# Patient Record
Sex: Female | Born: 1945 | Race: White | Hispanic: No | Smoking: Former smoker
Health system: Southern US, Community
[De-identification: ages and names within clinical notes are randomized; demographics above are authoritative.]

## PROBLEM LIST (undated history)

## (undated) DIAGNOSIS — E785 Hyperlipidemia, unspecified: Secondary | ICD-10-CM

## (undated) DIAGNOSIS — K922 Gastrointestinal hemorrhage, unspecified: Secondary | ICD-10-CM

## (undated) DIAGNOSIS — Z9889 Other specified postprocedural states: Secondary | ICD-10-CM

## (undated) DIAGNOSIS — R51 Headache: Secondary | ICD-10-CM

## (undated) DIAGNOSIS — J9610 Chronic respiratory failure, unspecified whether with hypoxia or hypercapnia: Secondary | ICD-10-CM

## (undated) DIAGNOSIS — F329 Major depressive disorder, single episode, unspecified: Secondary | ICD-10-CM

## (undated) DIAGNOSIS — I350 Nonrheumatic aortic (valve) stenosis: Secondary | ICD-10-CM

## (undated) DIAGNOSIS — M8580 Other specified disorders of bone density and structure, unspecified site: Secondary | ICD-10-CM

## (undated) DIAGNOSIS — D638 Anemia in other chronic diseases classified elsewhere: Secondary | ICD-10-CM

## (undated) DIAGNOSIS — I5032 Chronic diastolic (congestive) heart failure: Secondary | ICD-10-CM

## (undated) DIAGNOSIS — Z86010 Personal history of colonic polyps: Secondary | ICD-10-CM

## (undated) DIAGNOSIS — I10 Essential (primary) hypertension: Secondary | ICD-10-CM

## (undated) DIAGNOSIS — M1990 Unspecified osteoarthritis, unspecified site: Secondary | ICD-10-CM

## (undated) DIAGNOSIS — R519 Headache, unspecified: Secondary | ICD-10-CM

## (undated) DIAGNOSIS — J449 Chronic obstructive pulmonary disease, unspecified: Secondary | ICD-10-CM

## (undated) DIAGNOSIS — N2581 Secondary hyperparathyroidism of renal origin: Secondary | ICD-10-CM

## (undated) DIAGNOSIS — N289 Disorder of kidney and ureter, unspecified: Secondary | ICD-10-CM

## (undated) DIAGNOSIS — J479 Bronchiectasis, uncomplicated: Secondary | ICD-10-CM

## (undated) DIAGNOSIS — Z86718 Personal history of other venous thrombosis and embolism: Secondary | ICD-10-CM

## (undated) DIAGNOSIS — N186 End stage renal disease: Secondary | ICD-10-CM

## (undated) DIAGNOSIS — F32A Depression, unspecified: Secondary | ICD-10-CM

## (undated) DIAGNOSIS — I2699 Other pulmonary embolism without acute cor pulmonale: Secondary | ICD-10-CM

## (undated) DIAGNOSIS — K219 Gastro-esophageal reflux disease without esophagitis: Secondary | ICD-10-CM

## (undated) HISTORY — DX: Anemia in other chronic diseases classified elsewhere: D63.8

## (undated) HISTORY — DX: Gastrointestinal hemorrhage, unspecified: K92.2

## (undated) HISTORY — DX: Major depressive disorder, single episode, unspecified: F32.9

## (undated) HISTORY — DX: Secondary hyperparathyroidism of renal origin: N25.81

## (undated) HISTORY — DX: Headache, unspecified: R51.9

## (undated) HISTORY — DX: Chronic obstructive pulmonary disease, unspecified: J44.9

## (undated) HISTORY — DX: Unspecified osteoarthritis, unspecified site: M19.90

## (undated) HISTORY — DX: Bronchiectasis, uncomplicated: J47.9

## (undated) HISTORY — DX: Other specified disorders of bone density and structure, unspecified site: M85.80

## (undated) HISTORY — DX: Essential (primary) hypertension: I10

## (undated) HISTORY — DX: End stage renal disease: N18.6

## (undated) HISTORY — DX: Chronic respiratory failure, unspecified whether with hypoxia or hypercapnia: J96.10

## (undated) HISTORY — DX: Chronic diastolic (congestive) heart failure: I50.32

## (undated) HISTORY — DX: Headache: R51

## (undated) HISTORY — DX: Morbid (severe) obesity due to excess calories: E66.01

## (undated) HISTORY — DX: Personal history of other venous thrombosis and embolism: Z86.718

## (undated) HISTORY — DX: Personal history of colonic polyps: Z86.010

## (undated) HISTORY — DX: Gastro-esophageal reflux disease without esophagitis: K21.9

## (undated) HISTORY — DX: Other pulmonary embolism without acute cor pulmonale: I26.99

## (undated) HISTORY — DX: Nonrheumatic aortic (valve) stenosis: I35.0

## (undated) HISTORY — DX: Depression, unspecified: F32.A

## (undated) HISTORY — DX: Hyperlipidemia, unspecified: E78.5

---
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
## 1978-04-20 HISTORY — PX: TUBAL LIGATION: SHX77

## 2001-04-20 HISTORY — PX: BUNIONECTOMY: SHX129

## 2004-02-07 ENCOUNTER — Ambulatory Visit: Payer: Self-pay | Admitting: General Surgery

## 2004-04-20 HISTORY — PX: REPLACEMENT TOTAL KNEE: SUR1224

## 2004-09-29 ENCOUNTER — Ambulatory Visit: Payer: Self-pay | Admitting: General Practice

## 2004-10-01 ENCOUNTER — Inpatient Hospital Stay: Payer: Self-pay | Admitting: Specialist

## 2005-09-29 ENCOUNTER — Ambulatory Visit: Payer: Self-pay | Admitting: General Practice

## 2006-10-05 ENCOUNTER — Ambulatory Visit: Payer: Self-pay | Admitting: Endocrinology

## 2007-04-21 DIAGNOSIS — I2699 Other pulmonary embolism without acute cor pulmonale: Secondary | ICD-10-CM

## 2007-04-21 HISTORY — PX: SHOULDER ARTHROSCOPY: SHX128

## 2007-04-21 HISTORY — DX: Other pulmonary embolism without acute cor pulmonale: I26.99

## 2007-05-20 ENCOUNTER — Ambulatory Visit: Payer: Self-pay | Admitting: Family Medicine

## 2007-06-22 ENCOUNTER — Ambulatory Visit: Payer: Self-pay | Admitting: Nephrology

## 2007-10-17 ENCOUNTER — Ambulatory Visit: Payer: Self-pay | Admitting: Family Medicine

## 2007-11-01 ENCOUNTER — Ambulatory Visit: Payer: Self-pay | Admitting: Family Medicine

## 2007-11-02 ENCOUNTER — Ambulatory Visit: Payer: Self-pay | Admitting: Internal Medicine

## 2008-01-27 ENCOUNTER — Ambulatory Visit: Payer: Self-pay | Admitting: Family Medicine

## 2008-01-27 ENCOUNTER — Other Ambulatory Visit: Payer: Self-pay

## 2009-03-08 ENCOUNTER — Inpatient Hospital Stay: Payer: Self-pay | Admitting: Internal Medicine

## 2009-03-08 ENCOUNTER — Ambulatory Visit: Payer: Self-pay | Admitting: Family Medicine

## 2009-03-18 ENCOUNTER — Other Ambulatory Visit: Payer: Self-pay | Admitting: Family Medicine

## 2009-03-20 ENCOUNTER — Other Ambulatory Visit: Payer: Self-pay | Admitting: Family Medicine

## 2009-03-22 ENCOUNTER — Other Ambulatory Visit: Payer: Self-pay | Admitting: Family Medicine

## 2009-03-25 ENCOUNTER — Encounter: Payer: Self-pay | Admitting: Vascular Surgery

## 2009-04-20 ENCOUNTER — Encounter: Payer: Self-pay | Admitting: Vascular Surgery

## 2009-05-21 ENCOUNTER — Encounter: Payer: Self-pay | Admitting: Vascular Surgery

## 2009-06-18 ENCOUNTER — Encounter: Payer: Self-pay | Admitting: Vascular Surgery

## 2009-07-19 ENCOUNTER — Encounter: Payer: Self-pay | Admitting: Vascular Surgery

## 2009-08-18 ENCOUNTER — Encounter: Payer: Self-pay | Admitting: Vascular Surgery

## 2009-09-18 ENCOUNTER — Encounter: Payer: Self-pay | Admitting: Vascular Surgery

## 2009-10-18 ENCOUNTER — Encounter: Payer: Self-pay | Admitting: Vascular Surgery

## 2009-11-18 ENCOUNTER — Encounter: Payer: Self-pay | Admitting: Vascular Surgery

## 2009-12-19 ENCOUNTER — Encounter: Payer: Self-pay | Admitting: Vascular Surgery

## 2010-01-18 ENCOUNTER — Encounter: Payer: Self-pay | Admitting: Vascular Surgery

## 2010-02-18 ENCOUNTER — Encounter: Payer: Self-pay | Admitting: Vascular Surgery

## 2010-03-20 ENCOUNTER — Encounter: Payer: Self-pay | Admitting: Vascular Surgery

## 2010-04-20 HISTORY — PX: CHOLECYSTECTOMY: SHX55

## 2010-06-04 ENCOUNTER — Other Ambulatory Visit: Payer: Self-pay | Admitting: Internal Medicine

## 2010-06-19 ENCOUNTER — Other Ambulatory Visit: Payer: Self-pay | Admitting: Internal Medicine

## 2010-07-20 ENCOUNTER — Other Ambulatory Visit: Payer: Self-pay | Admitting: Internal Medicine

## 2010-10-02 ENCOUNTER — Other Ambulatory Visit: Payer: Self-pay | Admitting: Family Medicine

## 2010-10-19 ENCOUNTER — Other Ambulatory Visit: Payer: Self-pay | Admitting: Family Medicine

## 2010-12-05 ENCOUNTER — Other Ambulatory Visit: Payer: Self-pay | Admitting: Vascular Surgery

## 2010-12-20 ENCOUNTER — Other Ambulatory Visit: Payer: Self-pay | Admitting: Vascular Surgery

## 2011-01-19 ENCOUNTER — Other Ambulatory Visit: Payer: Self-pay | Admitting: Vascular Surgery

## 2011-02-19 ENCOUNTER — Other Ambulatory Visit: Payer: Self-pay | Admitting: Vascular Surgery

## 2011-03-21 ENCOUNTER — Other Ambulatory Visit: Payer: Self-pay | Admitting: Vascular Surgery

## 2011-04-21 ENCOUNTER — Other Ambulatory Visit: Payer: Self-pay | Admitting: Vascular Surgery

## 2011-04-21 DIAGNOSIS — Z860100 Personal history of colon polyps, unspecified: Secondary | ICD-10-CM

## 2011-04-21 HISTORY — DX: Personal history of colon polyps, unspecified: Z86.0100

## 2011-04-21 HISTORY — DX: Personal history of colonic polyps: Z86.010

## 2011-05-06 LAB — PROTIME-INR: Prothrombin Time: 25.5 secs — ABNORMAL HIGH (ref 11.5–14.7)

## 2011-05-22 ENCOUNTER — Other Ambulatory Visit: Payer: Self-pay | Admitting: Family Medicine

## 2011-05-22 ENCOUNTER — Other Ambulatory Visit: Payer: Self-pay | Admitting: Vascular Surgery

## 2011-05-22 LAB — COMPREHENSIVE METABOLIC PANEL
Anion Gap: 15 (ref 7–16)
Calcium, Total: 8.6 mg/dL (ref 8.5–10.1)
Chloride: 112 mmol/L — ABNORMAL HIGH (ref 98–107)
Co2: 17 mmol/L — ABNORMAL LOW (ref 21–32)
Creatinine: 3.74 mg/dL — ABNORMAL HIGH (ref 0.60–1.30)
EGFR (African American): 16 — ABNORMAL LOW
EGFR (Non-African Amer.): 13 — ABNORMAL LOW
Osmolality: 298 (ref 275–301)
Potassium: 4.3 mmol/L (ref 3.5–5.1)
SGOT(AST): 18 U/L (ref 15–37)
SGPT (ALT): 15 U/L

## 2011-05-22 LAB — CBC WITH DIFFERENTIAL/PLATELET
Basophil #: 0 10*3/uL (ref 0.0–0.1)
Basophil %: 0.4 %
Eosinophil #: 0.1 10*3/uL (ref 0.0–0.7)
Lymphocyte #: 1 10*3/uL (ref 1.0–3.6)
MCV: 94 fL (ref 80–100)
Monocyte %: 7 %
Neutrophil #: 5 10*3/uL (ref 1.4–6.5)
Platelet: 234 10*3/uL (ref 150–440)
RBC: 2.67 10*6/uL — ABNORMAL LOW (ref 3.80–5.20)
RDW: 13.2 % (ref 11.5–14.5)
WBC: 6.6 10*3/uL (ref 3.6–11.0)

## 2011-05-22 LAB — LIPID PANEL: HDL Cholesterol: 33 mg/dL — ABNORMAL LOW (ref 40–60)

## 2011-05-22 LAB — TSH: Thyroid Stimulating Horm: 1.73 u[IU]/mL

## 2011-06-05 LAB — PROTIME-INR
INR: 2.3
Prothrombin Time: 25.3 s — ABNORMAL HIGH (ref 11.5–14.7)

## 2011-06-19 ENCOUNTER — Other Ambulatory Visit: Payer: Self-pay | Admitting: Vascular Surgery

## 2011-07-02 ENCOUNTER — Ambulatory Visit: Payer: Self-pay | Admitting: Vascular Surgery

## 2011-07-02 LAB — BASIC METABOLIC PANEL
Anion Gap: 9 (ref 7–16)
BUN: 52 mg/dL — ABNORMAL HIGH (ref 7–18)
Co2: 24 mmol/L (ref 21–32)
Creatinine: 3.86 mg/dL — ABNORMAL HIGH (ref 0.60–1.30)
EGFR (African American): 15 — ABNORMAL LOW
EGFR (Non-African Amer.): 12 — ABNORMAL LOW
Sodium: 141 mmol/L (ref 136–145)

## 2011-07-02 LAB — CBC
HGB: 8.5 g/dL — ABNORMAL LOW (ref 12.0–16.0)
MCH: 31.8 pg (ref 26.0–34.0)
MCHC: 33.6 g/dL (ref 32.0–36.0)
RDW: 14.2 % (ref 11.5–14.5)
WBC: 6.4 10*3/uL (ref 3.6–11.0)

## 2011-07-03 LAB — PROTIME-INR
INR: 1.9
Prothrombin Time: 22 secs — ABNORMAL HIGH (ref 11.5–14.7)

## 2011-07-09 ENCOUNTER — Ambulatory Visit: Payer: Self-pay | Admitting: Vascular Surgery

## 2011-07-09 LAB — PROTIME-INR: Prothrombin Time: 15.8 secs — ABNORMAL HIGH (ref 11.5–14.7)

## 2011-07-20 ENCOUNTER — Other Ambulatory Visit: Payer: Self-pay | Admitting: Vascular Surgery

## 2011-08-10 LAB — CBC
HGB: 8.5 g/dL
WBC: 5.1
platelet count: 221

## 2011-08-10 LAB — PTH, INTACT: PTH Interp: 239

## 2011-08-10 LAB — COMPREHENSIVE METABOLIC PANEL
Calcium: 8.8 mg/dL
Potassium: 4.3 mmol/L

## 2011-08-10 LAB — LIPID PANEL: Cholesterol: 209 mg/dL — AB (ref 0–200)

## 2011-08-19 ENCOUNTER — Other Ambulatory Visit: Payer: Self-pay | Admitting: Vascular Surgery

## 2011-08-19 DIAGNOSIS — N186 End stage renal disease: Secondary | ICD-10-CM

## 2011-08-19 HISTORY — PX: COLONOSCOPY: SHX174

## 2011-08-19 HISTORY — PX: ESOPHAGOGASTRODUODENOSCOPY: SHX1529

## 2011-08-19 HISTORY — DX: End stage renal disease: N18.6

## 2011-08-19 LAB — HM COLONOSCOPY

## 2011-08-20 ENCOUNTER — Ambulatory Visit: Payer: Self-pay | Admitting: Family Medicine

## 2011-08-25 LAB — PROTIME-INR
INR: 3.8
Prothrombin Time: 37.3 secs — ABNORMAL HIGH (ref 11.5–14.7)

## 2011-08-27 ENCOUNTER — Ambulatory Visit: Payer: Self-pay | Admitting: General Surgery

## 2011-09-04 ENCOUNTER — Ambulatory Visit: Payer: Self-pay | Admitting: Internal Medicine

## 2011-09-04 LAB — CBC CANCER CENTER
Eosinophil: 5 %
HCT: 22.1 % — ABNORMAL LOW (ref 35.0–47.0)
HGB: 7.2 g/dL — ABNORMAL LOW (ref 12.0–16.0)
MCH: 31.1 pg (ref 26.0–34.0)
Monocytes: 10 %
RBC: 2.31 10*6/uL — ABNORMAL LOW (ref 3.80–5.20)
RDW: 14.4 % (ref 11.5–14.5)
Segmented Neutrophils: 72 %
WBC: 4.8 x10 3/mm (ref 3.6–11.0)

## 2011-09-04 LAB — LACTATE DEHYDROGENASE: LDH: 206 U/L (ref 84–246)

## 2011-09-04 LAB — RETICULOCYTES
Absolute Retic Count: 0.0305 10*6/uL (ref 0.024–0.084)
Reticulocyte: 1.32 % (ref 0.5–1.5)

## 2011-09-08 LAB — CBC CANCER CENTER
Basophil #: 0 x10 3/mm (ref 0.0–0.1)
Eosinophil #: 0.1 x10 3/mm (ref 0.0–0.7)
HCT: 24.1 % — ABNORMAL LOW (ref 35.0–47.0)
MCH: 31.8 pg (ref 26.0–34.0)
MCV: 96 fL (ref 80–100)
Monocyte #: 0.4 x10 3/mm (ref 0.2–0.9)
Neutrophil #: 2.6 x10 3/mm (ref 1.4–6.5)
Neutrophil %: 57.3 %
Platelet: 186 x10 3/mm (ref 150–440)
RBC: 2.51 10*6/uL — ABNORMAL LOW (ref 3.80–5.20)
RDW: 14.4 % (ref 11.5–14.5)
WBC: 4.5 x10 3/mm (ref 3.6–11.0)

## 2011-09-15 ENCOUNTER — Inpatient Hospital Stay: Payer: Self-pay | Admitting: General Surgery

## 2011-09-15 LAB — CBC WITH DIFFERENTIAL/PLATELET
Basophil #: 0 10*3/uL (ref 0.0–0.1)
Basophil %: 0.5 %
Eosinophil #: 0.1 10*3/uL (ref 0.0–0.7)
Eosinophil %: 1.6 %
HGB: 7.3 g/dL — ABNORMAL LOW (ref 12.0–16.0)
Lymphocyte #: 1.4 10*3/uL (ref 1.0–3.6)
MCH: 31.7 pg (ref 26.0–34.0)
MCV: 95 fL (ref 80–100)
Monocyte %: 7.5 %
Neutrophil #: 4.4 10*3/uL (ref 1.4–6.5)
Platelet: 208 10*3/uL (ref 150–440)
RBC: 2.29 10*6/uL — ABNORMAL LOW (ref 3.80–5.20)
WBC: 6.4 10*3/uL (ref 3.6–11.0)

## 2011-09-15 LAB — BASIC METABOLIC PANEL
Anion Gap: 12 (ref 7–16)
Creatinine: 4.75 mg/dL — ABNORMAL HIGH (ref 0.60–1.30)
EGFR (African American): 10 — ABNORMAL LOW
EGFR (Non-African Amer.): 9 — ABNORMAL LOW
Glucose: 85 mg/dL (ref 65–99)
Osmolality: 301 (ref 275–301)

## 2011-09-16 LAB — HEMOGLOBIN: HGB: 7.1 g/dL — ABNORMAL LOW (ref 12.0–16.0)

## 2011-09-16 LAB — APTT
Activated PTT: >160 secs (ref 23.6–35.9)
Activated PTT: >160 secs (ref 23.6–35.9)
Activated PTT: >160 secs (ref 23.6–35.9)

## 2011-09-16 LAB — PROTIME-INR: INR: 2.1

## 2011-09-17 LAB — PROTIME-INR
Prothrombin Time: 22.5 secs — ABNORMAL HIGH (ref 11.5–14.7)
Prothrombin Time: 19.3 secs — ABNORMAL HIGH (ref 11.5–14.7)

## 2011-09-17 LAB — APTT
Activated PTT: 127.3 secs — ABNORMAL HIGH (ref 23.6–35.9)
Activated PTT: 49.8 secs — ABNORMAL HIGH (ref 23.6–35.9)

## 2011-09-17 LAB — HEMOGLOBIN
HGB: 8 g/dL — ABNORMAL LOW (ref 12.0–16.0)
HGB: 8.3 g/dL — ABNORMAL LOW (ref 12.0–16.0)

## 2011-09-18 LAB — BASIC METABOLIC PANEL
Anion Gap: 13 (ref 7–16)
Calcium, Total: 8.3 mg/dL — ABNORMAL LOW (ref 8.5–10.1)
Chloride: 106 mmol/L (ref 98–107)
Creatinine: 3.43 mg/dL — ABNORMAL HIGH (ref 0.60–1.30)
EGFR (Non-African Amer.): 13 — ABNORMAL LOW
Osmolality: 295 (ref 275–301)
Sodium: 145 mmol/L (ref 136–145)

## 2011-09-18 LAB — APTT: Activated PTT: 38 secs — ABNORMAL HIGH (ref 23.6–35.9)

## 2011-09-18 LAB — PROTIME-INR
INR: 1.6
Prothrombin Time: 17.6 secs — ABNORMAL HIGH (ref 11.5–14.7)

## 2011-09-18 LAB — HEMOGLOBIN: HGB: 9.3 g/dL — ABNORMAL LOW (ref 12.0–16.0)

## 2011-09-19 ENCOUNTER — Other Ambulatory Visit: Payer: Self-pay | Admitting: Vascular Surgery

## 2011-09-19 ENCOUNTER — Ambulatory Visit: Payer: Self-pay | Admitting: Internal Medicine

## 2011-09-19 LAB — APTT
Activated PTT: 80.3 secs — ABNORMAL HIGH (ref 23.6–35.9)
Activated PTT: 42.3 secs — ABNORMAL HIGH (ref 23.6–35.9)

## 2011-09-20 LAB — PLATELET COUNT: Platelet: 150 10*3/uL (ref 150–440)

## 2011-09-20 LAB — PROTIME-INR
INR: 1.5
Prothrombin Time: 18.3 secs — ABNORMAL HIGH (ref 11.5–14.7)

## 2011-09-21 LAB — CBC WITH DIFFERENTIAL/PLATELET
Basophil %: 2.3 %
HGB: 8.2 g/dL — ABNORMAL LOW (ref 12.0–16.0)
Lymphocyte #: 1.2 10*3/uL (ref 1.0–3.6)
MCH: 30.8 pg (ref 26.0–34.0)
MCHC: 33 g/dL (ref 32.0–36.0)
MCV: 93 fL (ref 80–100)
Neutrophil #: 4.2 10*3/uL (ref 1.4–6.5)
Neutrophil %: 65.4 %
Platelet: 145 10*3/uL — ABNORMAL LOW (ref 150–440)
RDW: 14.8 % — ABNORMAL HIGH (ref 11.5–14.5)

## 2011-09-21 LAB — APTT: Activated PTT: 40.8 secs — ABNORMAL HIGH (ref 23.6–35.9)

## 2011-09-22 LAB — APTT: Activated PTT: 45.2 secs — ABNORMAL HIGH (ref 23.6–35.9)

## 2011-09-22 LAB — PATHOLOGY REPORT

## 2011-09-22 LAB — PROTIME-INR: INR: 1.1

## 2011-09-23 LAB — APTT: Activated PTT: 77.7 secs — ABNORMAL HIGH (ref 23.6–35.9)

## 2011-09-23 LAB — HEMOGLOBIN: HGB: 8.3 g/dL — ABNORMAL LOW (ref 12.0–16.0)

## 2011-09-23 LAB — PLATELET COUNT: Platelet: 165 10*3/uL (ref 150–440)

## 2011-09-24 LAB — CBC WITH DIFFERENTIAL/PLATELET
Basophil #: 0 10*3/uL (ref 0.0–0.1)
Basophil %: 0.1 %
Eosinophil %: 0.4 %
HCT: 26.9 % — ABNORMAL LOW (ref 35.0–47.0)
HGB: 8.8 g/dL — ABNORMAL LOW (ref 12.0–16.0)
Lymphocyte %: 2.1 %
MCH: 31 pg (ref 26.0–34.0)
MCV: 94 fL (ref 80–100)
Monocyte #: 0.2 x10 3/mm (ref 0.2–0.9)
Neutrophil #: 4.9 10*3/uL (ref 1.4–6.5)
RDW: 14.3 % (ref 11.5–14.5)
WBC: 5.2 10*3/uL (ref 3.6–11.0)

## 2011-09-24 LAB — RENAL FUNCTION PANEL
Albumin: 3.2 g/dL — ABNORMAL LOW (ref 3.4–5.0)
Anion Gap: 11 (ref 7–16)
Chloride: 102 mmol/L (ref 98–107)
Co2: 28 mmol/L (ref 21–32)
EGFR (African American): 14 — ABNORMAL LOW
Glucose: 75 mg/dL (ref 65–99)
Osmolality: 286 (ref 275–301)
Sodium: 141 mmol/L (ref 136–145)

## 2011-09-24 LAB — APTT
Activated PTT: 54.7 secs — ABNORMAL HIGH (ref 23.6–35.9)
Activated PTT: 66.9 secs — ABNORMAL HIGH (ref 23.6–35.9)

## 2011-09-24 LAB — PROTIME-INR
INR: 1.1
Prothrombin Time: 14.9 secs — ABNORMAL HIGH (ref 11.5–14.7)

## 2011-09-25 LAB — OCCULT BLOOD X 1 CARD TO LAB, STOOL: Occult Blood, Feces: NEGATIVE

## 2011-09-25 LAB — PLATELET COUNT: Platelet: 182 x10 3/mm 3

## 2011-09-25 LAB — PROTIME-INR
INR: 1.3
Prothrombin Time: 16.5 s — ABNORMAL HIGH

## 2011-09-25 LAB — HEMOGLOBIN: HGB: 9.1 g/dL — ABNORMAL LOW (ref 12.0–16.0)

## 2011-09-25 LAB — APTT: Activated PTT: 83.3 s — ABNORMAL HIGH

## 2011-09-26 LAB — PROTIME-INR
INR: 1.5
Prothrombin Time: 18.6 secs — ABNORMAL HIGH (ref 11.5–14.7)

## 2011-10-06 LAB — PROTIME-INR
INR: 2.7
Prothrombin Time: 29 secs — ABNORMAL HIGH (ref 11.5–14.7)

## 2011-11-05 ENCOUNTER — Other Ambulatory Visit: Payer: Self-pay | Admitting: Vascular Surgery

## 2011-11-05 ENCOUNTER — Ambulatory Visit: Payer: Self-pay | Admitting: Internal Medicine

## 2011-11-05 LAB — CBC CANCER CENTER
Basophil #: 0.1 x10 3/mm (ref 0.0–0.1)
Basophil %: 1.1 %
Eosinophil #: 0.2 x10 3/mm (ref 0.0–0.7)
Eosinophil %: 3.2 %
HCT: 31.9 % — ABNORMAL LOW (ref 35.0–47.0)
HGB: 10.3 g/dL — ABNORMAL LOW (ref 12.0–16.0)
Lymphocyte #: 1.4 x10 3/mm (ref 1.0–3.6)
MCH: 32.9 pg (ref 26.0–34.0)
MCHC: 32.5 g/dL (ref 32.0–36.0)
MCV: 101 fL — ABNORMAL HIGH (ref 80–100)
Neutrophil #: 3.5 x10 3/mm (ref 1.4–6.5)
RBC: 3.15 10*6/uL — ABNORMAL LOW (ref 3.80–5.20)
WBC: 5.5 x10 3/mm (ref 3.6–11.0)

## 2011-11-05 LAB — PROTIME-INR: Prothrombin Time: 16.9 secs — ABNORMAL HIGH (ref 11.5–14.7)

## 2011-11-05 LAB — RETICULOCYTES
Absolute Retic Count: 0.094 10*6/uL — ABNORMAL HIGH (ref 0.024–0.084)
Reticulocyte: 3 % — ABNORMAL HIGH (ref 0.5–1.5)

## 2011-11-17 ENCOUNTER — Ambulatory Visit: Payer: Self-pay | Admitting: Vascular Surgery

## 2011-11-19 ENCOUNTER — Other Ambulatory Visit: Payer: Self-pay | Admitting: Vascular Surgery

## 2011-11-19 ENCOUNTER — Ambulatory Visit: Payer: Self-pay | Admitting: Internal Medicine

## 2012-01-14 ENCOUNTER — Ambulatory Visit: Payer: Self-pay | Admitting: Vascular Surgery

## 2012-01-14 LAB — POTASSIUM: Potassium: 3.6 mmol/L (ref 3.5–5.1)

## 2012-01-14 LAB — PROTIME-INR
INR: 3
Prothrombin Time: 31.4 secs — ABNORMAL HIGH (ref 11.5–14.7)

## 2012-04-21 ENCOUNTER — Ambulatory Visit: Payer: Self-pay | Admitting: Internal Medicine

## 2012-06-06 ENCOUNTER — Other Ambulatory Visit (INDEPENDENT_AMBULATORY_CARE_PROVIDER_SITE_OTHER): Payer: Self-pay | Admitting: Internal Medicine

## 2012-06-07 NOTE — Telephone Encounter (Signed)
This patient would like refill on coumadin 2.5mg  but she has nothing in her chart and I wasn't sure what to do....please advise

## 2012-08-22 ENCOUNTER — Ambulatory Visit: Payer: Self-pay | Admitting: Vascular Surgery

## 2012-08-22 LAB — PROTIME-INR: Prothrombin Time: 33.5 secs — ABNORMAL HIGH (ref 11.5–14.7)

## 2012-10-20 ENCOUNTER — Ambulatory Visit (INDEPENDENT_AMBULATORY_CARE_PROVIDER_SITE_OTHER): Payer: Medicare Other | Admitting: Family Medicine

## 2012-10-20 ENCOUNTER — Encounter: Payer: Self-pay | Admitting: Family Medicine

## 2012-10-20 VITALS — BP 148/92 | HR 80 | Temp 97.6°F | Ht 64.0 in | Wt 225.0 lb

## 2012-10-20 DIAGNOSIS — Z86718 Personal history of other venous thrombosis and embolism: Secondary | ICD-10-CM

## 2012-10-20 DIAGNOSIS — E785 Hyperlipidemia, unspecified: Secondary | ICD-10-CM

## 2012-10-20 DIAGNOSIS — R053 Chronic cough: Secondary | ICD-10-CM

## 2012-10-20 DIAGNOSIS — F32A Depression, unspecified: Secondary | ICD-10-CM

## 2012-10-20 DIAGNOSIS — M1990 Unspecified osteoarthritis, unspecified site: Secondary | ICD-10-CM

## 2012-10-20 DIAGNOSIS — K219 Gastro-esophageal reflux disease without esophagitis: Secondary | ICD-10-CM

## 2012-10-20 DIAGNOSIS — N186 End stage renal disease: Secondary | ICD-10-CM

## 2012-10-20 DIAGNOSIS — I10 Essential (primary) hypertension: Secondary | ICD-10-CM

## 2012-10-20 DIAGNOSIS — R059 Cough, unspecified: Secondary | ICD-10-CM

## 2012-10-20 DIAGNOSIS — R05 Cough: Secondary | ICD-10-CM

## 2012-10-20 DIAGNOSIS — F329 Major depressive disorder, single episode, unspecified: Secondary | ICD-10-CM

## 2012-10-20 MED ORDER — PANTOPRAZOLE SODIUM 40 MG PO TBEC: 40.0000 mg | DELAYED_RELEASE_TABLET | Freq: Every day | ORAL | Status: DC

## 2012-10-20 NOTE — Progress Notes (Signed)
Subjective:    Patient ID: Ariel Smith, female    DOB: 09/22/1945, 67 y.o.   MRN: [id number]  HPI CC: new pt to establish  Presents with daughter and granddaughter. Prior saw Dr. Elease Hashimoto [facility]).   ESRD - sees [name] We have requested records from both physicians.  HTN - on losartan 100mg  daily. HLD - lovastatin and tolerating fine.  Compliant with meds. ESRD on HD TThSa -   Chronic cough - present for years, worse since started dialysis.  Coughing fits lead to post tussive emesis.  Has tried symbicort and ventolin HFA which both help.  Some trouble affording symbicort.  Has been told has COPD.  Had spirometry done at prior PCP but unaware of results.    Some GERD - not on meds for this.  Not food related.   Allergic rhinitis - seasonal rhinorrhea and watery eyes. Not on any allergy medication. Mood issue - h/o depression/anxiety - recently celexa increased to 40mg  daily by [name], thinks effective.  Preventative: Unsure last CPE.  Due for this.  Medications and allergies reviewed and updated in chart.  Past histories reviewed and updated if relevant as below. There are no active problems to display for this patient.  Past Medical History  Diagnosis Date  . Arthritis   . Depression   . Frequent headaches   . HTN (hypertension)   . HLD (hyperlipidemia)   . ESRD (end stage renal disease) 08/2011    TRS [name]  . History of colon polyps   . 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 Cholecystectomy  2012  . Foot surgery Left 2003  . Replacement total knee Right 2006  . Shoulder arthroscopy Right 2009  . Colonoscopy  2013    [name]   History  Substance Use Topics  . Smoking status: Former Smoker -- 2.00 packs/day for 30 years    Start date: 04/20/1978    Quit date: 04/20/2009  . Smokeless tobacco: Never Used  . Alcohol Use: No   Family History  Problem  Relation Age of Onset  . Deep vein thrombosis Brother   . Cancer Mother 92    colon  . Cancer Father     prostate  . Hypertension Father   . Hypertension Brother   . Stroke Mother   . Diabetes Neg Hx   . CAD Neg Hx    Allergies  Allergen Reactions  . Other Other (See Comments)    Medrin-Headache   No current outpatient prescriptions on file prior to visit.   No current facility-administered medications on file prior to visit.     Review of Systems  Constitutional: Negative for fever, chills, activity change, appetite change, fatigue and unexpected weight change.  HENT: Negative for hearing loss and neck pain.   Eyes: Negative for visual disturbance.  Respiratory: Positive for cough and shortness of breath. Negative for chest tightness and wheezing.   Cardiovascular: Positive for leg swelling. Negative for chest pain and palpitations.  Gastrointestinal: Positive for nausea, vomiting and diarrhea. Negative for abdominal pain, constipation, blood in stool and abdominal distention.  Genitourinary: Negative for hematuria and difficulty urinating.  Musculoskeletal: Negative for myalgias and arthralgias.  Skin: Negative for rash.  Neurological: Negative for dizziness, seizures, syncope and headaches.  Hematological: Negative for adenopathy. Bruises/bleeds easily.  Psychiatric/Behavioral: Positive for dysphoric mood. The patient is nervous/anxious.  Objective:   Physical Exam  Nursing note and vitals reviewed. Constitutional: She is oriented to person, place, and time. She appears well-developed and well-nourished. No distress.  HENT:  Head: Normocephalic and atraumatic.  Right Ear: Hearing, tympanic membrane, external ear and ear canal normal.  Left Ear: Hearing, tympanic membrane, external ear and ear canal normal.  Nose: Nose normal.  Mouth/Throat: Oropharynx is clear and moist. No oropharyngeal exudate.  Eyes: Conjunctivae and EOM are normal. Pupils are equal, round,  and reactive to light. No scleral icterus.  Neck: Normal range of motion. Neck supple. Carotid bruit is not present.  Cardiovascular: Normal rate, regular rhythm and intact distal pulses.   Murmur (2/6 SEM) heard. Pulses:      Radial pulses are 2+ on the right side, and 2+ on the left side.  Pulmonary/Chest: Effort normal and breath sounds normal. No respiratory distress. She has no wheezes. She has no rales.  Musculoskeletal: Normal range of motion. She exhibits no edema.  Upper extremity forearm fistula  Lymphadenopathy:    She has no cervical adenopathy.  Neurological: She is alert and oriented to person, place, and time.  CN grossly intact, station and gait intact  Skin: Skin is warm and dry. No rash noted.  Psychiatric: She has a normal mood and affect. Her behavior is normal. Judgment and thought content normal.       Assessment & Plan:  RTC 22mo for medicare wellness visit as due.

## 2012-10-20 NOTE — Patient Instructions (Signed)
Try protonix 40mg  daily for 3 weeks to see if cough improves with this. I have requested records from [name] and Dr. Augusto Gamble to see you today, call us with questions. Return to see me in 3 months for wellness visit, prior fasting for blood work.

## 2012-10-22 ENCOUNTER — Encounter: Payer: Self-pay | Admitting: Family Medicine

## 2012-10-22 DIAGNOSIS — R05 Cough: Secondary | ICD-10-CM | POA: Insufficient documentation

## 2012-10-22 DIAGNOSIS — F331 Major depressive disorder, recurrent, moderate: Secondary | ICD-10-CM | POA: Insufficient documentation

## 2012-10-22 DIAGNOSIS — K219 Gastro-esophageal reflux disease without esophagitis: Secondary | ICD-10-CM | POA: Insufficient documentation

## 2012-10-22 DIAGNOSIS — M1990 Unspecified osteoarthritis, unspecified site: Secondary | ICD-10-CM | POA: Insufficient documentation

## 2012-10-22 DIAGNOSIS — E785 Hyperlipidemia, unspecified: Secondary | ICD-10-CM | POA: Insufficient documentation

## 2012-10-22 DIAGNOSIS — I10 Essential (primary) hypertension: Secondary | ICD-10-CM | POA: Insufficient documentation

## 2012-10-22 DIAGNOSIS — R053 Chronic cough: Secondary | ICD-10-CM | POA: Insufficient documentation

## 2012-10-22 DIAGNOSIS — Z86718 Personal history of other venous thrombosis and embolism: Secondary | ICD-10-CM | POA: Insufficient documentation

## 2012-10-22 NOTE — Assessment & Plan Note (Signed)
Chronic, stable. Continue to monitor on celexa 40mg  daily.

## 2012-10-22 NOTE — Assessment & Plan Note (Signed)
Chronic, stable continue lovastatin. await records of last FLP - from dialysis center- and check next fasting blood work.

## 2012-10-22 NOTE — Assessment & Plan Note (Signed)
Chronic.  Elevated today, will continue to monitor, consider change in Bp remains elevated.

## 2012-10-22 NOTE — Assessment & Plan Note (Signed)
Present for years, worse since HD started. ?GERD related as pt endorses some GERD sxs.  Trial of protonix 40mg  daily for 3 weeks and then reassess at next visit.

## 2012-10-22 NOTE — Assessment & Plan Note (Signed)
Await records from [name]. Continues HD weekly.

## 2012-10-22 NOTE — Assessment & Plan Note (Signed)
Trial of PPI. 

## 2012-10-22 NOTE — Assessment & Plan Note (Signed)
S/p R TKR. Stable currently.

## 2012-10-22 NOTE — Assessment & Plan Note (Signed)
On coumadin for h/o recurrent DVT. Await records of last INR

## 2012-11-10 ENCOUNTER — Encounter: Payer: Self-pay | Admitting: Family Medicine

## 2012-11-13 ENCOUNTER — Encounter: Payer: Self-pay | Admitting: Family Medicine

## 2012-11-14 ENCOUNTER — Encounter: Payer: Self-pay | Admitting: Family Medicine

## 2012-11-20 ENCOUNTER — Encounter: Payer: Self-pay | Admitting: Family Medicine

## 2012-11-23 ENCOUNTER — Ambulatory Visit: Payer: Self-pay | Admitting: Vascular Surgery

## 2012-11-23 LAB — PROTIME-INR: Prothrombin Time: 18 secs — ABNORMAL HIGH (ref 11.5–14.7)

## 2013-01-10 ENCOUNTER — Other Ambulatory Visit: Payer: Self-pay | Admitting: Family Medicine

## 2013-01-10 DIAGNOSIS — I10 Essential (primary) hypertension: Secondary | ICD-10-CM

## 2013-01-10 DIAGNOSIS — N186 End stage renal disease: Secondary | ICD-10-CM

## 2013-01-10 DIAGNOSIS — E785 Hyperlipidemia, unspecified: Secondary | ICD-10-CM

## 2013-01-11 ENCOUNTER — Ambulatory Visit: Payer: Self-pay | Admitting: Vascular Surgery

## 2013-01-11 DIAGNOSIS — I10 Essential (primary) hypertension: Secondary | ICD-10-CM

## 2013-01-11 LAB — BASIC METABOLIC PANEL
Anion Gap: 4 — ABNORMAL LOW (ref 7–16)
BUN: 30 mg/dL — ABNORMAL HIGH (ref 7–18)
Chloride: 102 mmol/L (ref 98–107)
Creatinine: 5.6 mg/dL — ABNORMAL HIGH (ref 0.60–1.30)
EGFR (African American): 8 — ABNORMAL LOW
EGFR (Non-African Amer.): 7 — ABNORMAL LOW
Osmolality: 282 (ref 275–301)
Sodium: 137 mmol/L (ref 136–145)

## 2013-01-11 LAB — CBC
HCT: 33.3 % — ABNORMAL LOW (ref 35.0–47.0)
HGB: 11.5 g/dL — ABNORMAL LOW (ref 12.0–16.0)
MCH: 36.2 pg — ABNORMAL HIGH (ref 26.0–34.0)
Platelet: 203 10*3/uL (ref 150–440)
RBC: 3.16 10*6/uL — ABNORMAL LOW (ref 3.80–5.20)

## 2013-01-16 ENCOUNTER — Telehealth: Payer: Self-pay | Admitting: Radiology

## 2013-01-16 ENCOUNTER — Other Ambulatory Visit (INDEPENDENT_AMBULATORY_CARE_PROVIDER_SITE_OTHER): Payer: Medicare Other

## 2013-01-16 DIAGNOSIS — E785 Hyperlipidemia, unspecified: Secondary | ICD-10-CM

## 2013-01-16 DIAGNOSIS — I10 Essential (primary) hypertension: Secondary | ICD-10-CM

## 2013-01-16 DIAGNOSIS — N186 End stage renal disease: Secondary | ICD-10-CM

## 2013-01-16 LAB — COMPREHENSIVE METABOLIC PANEL
ALT: 11 U/L (ref 0–35)
Albumin: 3.6 g/dL (ref 3.5–5.2)
Alkaline Phosphatase: 51 U/L (ref 39–117)
CO2: 28 mEq/L (ref 19–32)
Chloride: 103 mEq/L (ref 96–112)
GFR: 6.27 mL/min — CL (ref >60.00)
Glucose, Bld: 91 mg/dL (ref 70–99)
Potassium: 4.3 mEq/L (ref 3.5–5.1)
Sodium: 140 mEq/L (ref 135–145)
Total Bilirubin: 0.5 mg/dL (ref 0.3–1.2)
Total Protein: 6.7 g/dL (ref 6.0–8.3)

## 2013-01-16 LAB — CBC WITH DIFFERENTIAL/PLATELET
Basophils Relative: 0.4 % (ref 0.0–3.0)
Eosinophils Absolute: 0.1 10*3/uL (ref 0.0–0.7)
Eosinophils Relative: 1.9 % (ref 0.0–5.0)
HCT: 31.5 % — ABNORMAL LOW (ref 36.0–46.0)
Lymphs Abs: 1.6 10*3/uL (ref 0.7–4.0)
MCHC: 34 g/dL (ref 30.0–36.0)
MCV: 103.5 fl — ABNORMAL HIGH (ref 78.0–100.0)
Neutro Abs: 3.8 10*3/uL (ref 1.4–7.7)
Neutrophils Relative %: 62.8 % (ref 43.0–77.0)
Platelets: 205 10*3/uL (ref 150.0–400.0)
RDW: 14.2 % (ref 11.5–14.6)

## 2013-01-16 LAB — LIPID PANEL
HDL: 39 mg/dL — ABNORMAL LOW (ref >39.00)
LDL Cholesterol: 100 mg/dL — ABNORMAL HIGH (ref 0–99)
Total CHOL/HDL Ratio: 4
VLDL: 29.6 mg/dL (ref 0.0–40.0)

## 2013-01-16 NOTE — Telephone Encounter (Signed)
Known ESRD on HD.  Will review blood work with patient at [facility]

## 2013-01-16 NOTE — Telephone Encounter (Signed)
[facility] called a critical lab result, CRT- 7.0, GFR- 6.27. Results given to Dr Sharen Hones

## 2013-01-18 ENCOUNTER — Ambulatory Visit: Payer: Self-pay | Admitting: Vascular Surgery

## 2013-01-18 DIAGNOSIS — M8580 Other specified disorders of bone density and structure, unspecified site: Secondary | ICD-10-CM

## 2013-01-18 HISTORY — PX: EXTERIORIZATION OF A CONTINUOUS AMBULATORY PERITONEAL DIALYSIS CATHETER: SHX6382

## 2013-01-18 HISTORY — DX: Other specified disorders of bone density and structure, unspecified site: M85.80

## 2013-01-18 LAB — POTASSIUM: Potassium: 3.8 mmol/L (ref 3.5–5.1)

## 2013-01-20 ENCOUNTER — Encounter: Payer: Medicare Other | Admitting: Family Medicine

## 2013-01-27 ENCOUNTER — Encounter: Payer: Self-pay | Admitting: Family Medicine

## 2013-01-27 ENCOUNTER — Ambulatory Visit (INDEPENDENT_AMBULATORY_CARE_PROVIDER_SITE_OTHER): Payer: Medicare Other | Admitting: Family Medicine

## 2013-01-27 VITALS — BP 140/78 | HR 72 | Temp 98.1°F | Ht 64.0 in | Wt 219.2 lb

## 2013-01-27 DIAGNOSIS — Z780 Asymptomatic menopausal state: Secondary | ICD-10-CM

## 2013-01-27 DIAGNOSIS — F32A Depression, unspecified: Secondary | ICD-10-CM

## 2013-01-27 DIAGNOSIS — Z0000 Encounter for general adult medical examination without abnormal findings: Secondary | ICD-10-CM | POA: Insufficient documentation

## 2013-01-27 DIAGNOSIS — Z86718 Personal history of other venous thrombosis and embolism: Secondary | ICD-10-CM

## 2013-01-27 DIAGNOSIS — N186 End stage renal disease: Secondary | ICD-10-CM

## 2013-01-27 DIAGNOSIS — F329 Major depressive disorder, single episode, unspecified: Secondary | ICD-10-CM

## 2013-01-27 MED ORDER — LOVASTATIN 20 MG PO TABS: 20.0000 mg | ORAL_TABLET | Freq: Every day | ORAL | Status: DC

## 2013-01-27 MED ORDER — CITALOPRAM HYDROBROMIDE 40 MG PO TABS: 40.0000 mg | ORAL_TABLET | Freq: Every day | ORAL | Status: DC

## 2013-01-27 NOTE — Assessment & Plan Note (Addendum)
I have personally reviewed the Medicare Annual Wellness questionnaire and have noted 1. The patient's medical and social history 2. Their use of alcohol, tobacco or illicit drugs 3. Their current medications and supplements 4. The patient's functional ability including ADL's, fall risks, home safety risks and hearing or visual impairment. 5. Diet and physical activity 6. Evidence for depression or mood disorders The patients weight, height, BMI have been recorded in the chart.  Hearing and vision has been addressed. I have made referrals, counseling and provided education to the patient based review of the above and I have provided the pt with a written personalized care plan for preventive services. See scanned questionairre. Advanced directives discussed: pt will bring me a copy.  Reviewed preventative protocols and updated unless pt declined. Breast exam today - discussed pros/cons of mammogram - pt decides to defer mammogram for now. UTD colonoscopy. UTD hep B, pneumovax , and flu shot. Schedule bone density exam.

## 2013-01-27 NOTE — Assessment & Plan Note (Signed)
Continue coumadin, followed by HD center.

## 2013-01-27 NOTE — Progress Notes (Signed)
Subjective:    Patient ID: Ariel Smith, female    DOB: 1946/02/05, 67 y.o.   MRN: [id number]  HPI CC: medicare wellness  PD catheter placed 01/18/2013.  To have peritoneal dialysis at home.  9 hours every night.  Sees [name].  Stool urgency - with accidents.  Formed stools. Had colonoscopy - looking ok per patient. Also had cholecystectomy.  ?related.  Failed hearing screen - declines audiologist referral Passes vision No falls in last year. + screen for depression - celexa 40mg  daily.  Ran out a few days ago.  Needs to refill.  Preventative:  Colonoscopy 2013 - told normal ([facility]).   Mammogram - just found out daughter has breast cancer.  Doesn't do breast exams at home.  Requests today. Pelvic exam - deferred today.  No vaginal bleeding.  menopause at age 81. Flu shot - at HD center Pneumovax - has completed Advanced directives - handout provided.  Medications and allergies reviewed and updated in chart.  Past histories reviewed and updated if relevant as below. Patient Active Problem List   Diagnosis Date Noted  . Chronic cough 10/22/2012  . Osteoarthritis   . Depression   . HTN (hypertension)   . HLD (hyperlipidemia)   . History of DVT of lower extremity   . GERD (gastroesophageal reflux disease)   . ESRD (end stage renal disease) 08/19/2011   Past Medical History  Diagnosis Date  . Osteoarthritis   . Depression   . Frequent headaches   . HTN (hypertension)   . HLD (hyperlipidemia)   . History of colon polyps 2013    colonoscopy ([name])  . 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 Systolic murmur     mild  . GERD (gastroesophageal reflux disease)   . COPD (chronic obstructive pulmonary disease)   . ESRD (end stage renal disease) 08/2011    TThSa, L forearm AV fistula, [name]  . Secondary hyperparathyroidism of renal origin    Past Surgical History   Procedure Laterality Date  . Cholecystectomy  2012  . Bunionectomy Left 2003  . Replacement total knee Right 2006  . Shoulder arthroscopy Right 2009  . Colonoscopy  08/2011    colon biopsies, [name]  . Spirometry  04/2011    WNL  . US echocardiography  06/2008    EF >55%  . Esophagogastroduodenoscopy  08/2011    gastric cardia polyp  . Tonsillectomy  1955  . Tubal ligation  1980   History  Substance Use Topics  . Smoking status: Former Smoker -- 2.00 packs/day for 30 years    Start date: 04/20/1978    Quit date: 04/20/2009  . Smokeless tobacco: Never Used  . Alcohol Use: No   Family History  Problem Relation Age of Onset  . Deep vein thrombosis Brother   . Cancer Mother 75    colon  . Cancer Father     prostate  . Hypertension Father   . Hypertension Brother   . Stroke Mother   . Diabetes Neg Hx   . CAD Neg Hx    Allergies  Allergen Reactions  . Other Other (See Comments)    Medrin-Headache   Current Outpatient Prescriptions on File Prior to Visit  Medication Sig Dispense Refill  . albuterol (VENTOLIN HFA) 108 (90 BASE) MCG/ACT inhaler Inhale 2 puffs into the lungs every 6 (six) hours as needed  for wheezing.      . B Complex-C (B-COMPLEX WITH VITAMIN C) tablet Take 1 tablet by mouth daily.      . B Complex-C-Folic Acid (DIALYVITE PO) Take 2,000 Int'l Units by mouth daily.      . budesonide-formoterol (SYMBICORT) 160-4.5 MCG/ACT inhaler Inhale 2 puffs into the lungs 2 (two) times daily.      . Calcium Acetate 667 MG TABS Take 2 tablets by mouth 3 (three) times daily with meals.      . citalopram (CELEXA) 40 MG tablet Take 40 mg by mouth daily.      . diphenhydramine-acetaminophen (TYLENOL PM) 25-500 MG TABS Take 1 tablet by mouth at bedtime as needed.      Marland Kitchen losartan (COZAAR) 100 MG tablet Take 100 mg by mouth daily.      . pantoprazole (PROTONIX) 40 MG tablet Take 1 tablet (40 mg total) by mouth daily.  30 tablet  3  . sevelamer carbonate (RENVELA) 800 MG  tablet Take 800 mg by mouth as directed. 2 pills with snacks and 3 pills with full meals      . warfarin (COUMADIN) 2.5 MG tablet Take 2.5 mg by mouth daily.      Marland Kitchen lovastatin (MEVACOR) 20 MG tablet Take 20 mg by mouth at bedtime.       No current facility-administered medications on file prior to visit.     Review of Systems  Constitutional: Negative for fever, chills, activity change, appetite change, fatigue and unexpected weight change.  HENT: Negative for hearing loss.   Eyes: Negative for visual disturbance.  Respiratory: Positive for cough, shortness of breath and wheezing. Negative for chest tightness.   Cardiovascular: Positive for leg swelling. Negative for chest pain and palpitations.  Gastrointestinal: Negative for nausea, vomiting, abdominal pain, diarrhea, constipation, blood in stool and abdominal distention.  Genitourinary: Negative for hematuria and difficulty urinating.  Musculoskeletal: Negative for arthralgias, myalgias and neck pain.  Skin: Negative for rash.  Neurological: Positive for headaches. Negative for dizziness, seizures and syncope.  Hematological: Negative for adenopathy. Does not bruise/bleed easily.  Psychiatric/Behavioral: Negative for dysphoric mood. The patient is not nervous/anxious.        Objective:   Physical Exam  Nursing note and vitals reviewed. Constitutional: She is oriented to person, place, and time. She appears well-developed and well-nourished. No distress.  HENT:  Head: Normocephalic and atraumatic.  Right Ear: Hearing, tympanic membrane, external ear and ear canal normal.  Left Ear: Hearing, tympanic membrane, external ear and ear canal normal.  Nose: Nose normal.  Mouth/Throat: Oropharynx is clear and moist. No oropharyngeal exudate.  Eyes: Conjunctivae and EOM are normal. Pupils are equal, round, and reactive to light. No scleral icterus.  Neck: Normal range of motion. Neck supple. Carotid bruit is not present. No thyromegaly  present.  Cardiovascular: Normal rate, regular rhythm, normal heart sounds and intact distal pulses.   No murmur heard. Pulses:      Radial pulses are 2+ on the right side, and 2+ on the left side.  Pulmonary/Chest: Effort normal and breath sounds normal. No respiratory distress. She has no wheezes. She has no rales. Right breast exhibits no inverted nipple, no mass, no nipple discharge, no skin change and no tenderness. Left breast exhibits no inverted nipple, no mass, no nipple discharge, no skin change and no tenderness. Breasts are symmetrical.  Abdominal: Soft. Bowel sounds are normal. She exhibits no distension and no mass. There is no tenderness. There is no rebound and  no guarding.  PD in place, dressings c/d/i, incisions healing without erythema or induration  Musculoskeletal: Normal range of motion. She exhibits no edema.  Lymphadenopathy:       Head (right side): No submandibular adenopathy present.       Head (left side): No submandibular adenopathy present.    She has no cervical adenopathy.    She has no axillary adenopathy.       Right axillary: No lateral adenopathy present.       Left axillary: No lateral adenopathy present.      Right: No supraclavicular adenopathy present.       Left: No supraclavicular adenopathy present.  Neurological: She is alert and oriented to person, place, and time.  CN grossly intact, station and gait intact  Skin: Skin is warm and dry. No rash noted.  Psychiatric: She has a normal mood and affect. Her behavior is normal. Judgment and thought content normal.       Assessment & Plan:

## 2013-01-27 NOTE — Patient Instructions (Addendum)
We will call you to schedule bone density scan to check for osteoporosis Bring me copy of advanced directive/living will. Return in 6 months for follow up Good to see you today, call us with questions. Let's keep an eye on hearing.

## 2013-01-27 NOTE — Assessment & Plan Note (Signed)
rec restart celexa and will monitor on SSRI.

## 2013-01-27 NOTE — Assessment & Plan Note (Signed)
Planning on trial of peritoneal dialysis nightly at home.

## 2013-02-15 ENCOUNTER — Ambulatory Visit: Payer: Self-pay | Admitting: Family Medicine

## 2013-02-18 DIAGNOSIS — Z992 Dependence on renal dialysis: Secondary | ICD-10-CM | POA: Insufficient documentation

## 2013-02-20 ENCOUNTER — Encounter: Payer: Self-pay | Admitting: Family Medicine

## 2013-02-22 ENCOUNTER — Encounter: Payer: Self-pay | Admitting: Family Medicine

## 2013-02-22 ENCOUNTER — Encounter: Payer: Self-pay | Admitting: *Deleted

## 2013-02-26 ENCOUNTER — Encounter: Payer: Self-pay | Admitting: Family Medicine

## 2013-03-17 ENCOUNTER — Other Ambulatory Visit: Payer: Self-pay | Admitting: Family Medicine

## 2013-06-02 LAB — CALCIUM
Alkaline Phosphatase: 67 U/L
CALCIUM: 8.4 mg/dL
PHOSPHORUS: 4.9 mg/dL (ref 2.5–4.9)
PTH INTERP: 504

## 2013-06-02 LAB — CREATININE, SERUM: CREATININE: 7.8

## 2013-06-02 LAB — CBC
MCV: 105.9 fL
WBC: 7.4
platelet count: 244

## 2013-06-02 LAB — IRON AND TIBC
Ferritin: 1004
Iron Saturation: 38
Iron: 77
TIBC: 202

## 2013-06-02 LAB — HEMOGLOBIN: HGB: 9 g/dL

## 2013-07-19 ENCOUNTER — Ambulatory Visit: Payer: Self-pay | Admitting: Family Medicine

## 2013-07-19 LAB — CBC CANCER CENTER
BASOS ABS: 0 x10 3/mm (ref 0.0–0.1)
BASOS PCT: 0.6 %
EOS ABS: 0.1 x10 3/mm (ref 0.0–0.7)
EOS PCT: 1.7 %
HCT: 22.9 % — AB (ref 35.0–47.0)
HGB: 7.5 g/dL — ABNORMAL LOW (ref 12.0–16.0)
LYMPHS ABS: 0.8 x10 3/mm — AB (ref 1.0–3.6)
Lymphocyte %: 9.6 %
MCH: 35 pg — ABNORMAL HIGH (ref 26.0–34.0)
MCHC: 32.8 g/dL (ref 32.0–36.0)
MCV: 107 fL — AB (ref 80–100)
MONOS PCT: 6.5 %
Monocyte #: 0.6 x10 3/mm (ref 0.2–0.9)
NEUTROS ABS: 7.1 x10 3/mm — AB (ref 1.4–6.5)
NEUTROS PCT: 81.6 %
Platelet: 205 x10 3/mm (ref 150–440)
RBC: 2.15 10*6/uL — AB (ref 3.80–5.20)
RDW: 14.2 % (ref 11.5–14.5)
WBC: 8.7 x10 3/mm (ref 3.6–11.0)

## 2013-07-19 LAB — BASIC METABOLIC PANEL
Anion Gap: 16 (ref 7–16)
BUN: 50 mg/dL — AB (ref 7–18)
CALCIUM: 8.3 mg/dL — AB (ref 8.5–10.1)
CHLORIDE: 106 mmol/L (ref 98–107)
Co2: 22 mmol/L (ref 21–32)
Creatinine: 8.08 mg/dL — ABNORMAL HIGH (ref 0.60–1.30)
EGFR (Non-African Amer.): 5 — ABNORMAL LOW
GFR CALC AF AMER: 5 — AB
GLUCOSE: 120 mg/dL — AB (ref 65–99)
OSMOLALITY: 301 (ref 275–301)
Potassium: 3.7 mmol/L (ref 3.5–5.1)
Sodium: 144 mmol/L (ref 136–145)

## 2013-08-02 ENCOUNTER — Telehealth: Payer: Self-pay | Admitting: Family Medicine

## 2013-08-02 ENCOUNTER — Encounter: Payer: Self-pay | Admitting: Family Medicine

## 2013-08-02 ENCOUNTER — Ambulatory Visit (INDEPENDENT_AMBULATORY_CARE_PROVIDER_SITE_OTHER): Payer: Medicare Other | Admitting: Family Medicine

## 2013-08-02 VITALS — BP 128/86 | HR 84 | Temp 97.5°F | Wt 214.0 lb

## 2013-08-02 DIAGNOSIS — F3289 Other specified depressive episodes: Secondary | ICD-10-CM

## 2013-08-02 DIAGNOSIS — R600 Localized edema: Secondary | ICD-10-CM

## 2013-08-02 DIAGNOSIS — F32A Depression, unspecified: Secondary | ICD-10-CM

## 2013-08-02 DIAGNOSIS — I10 Essential (primary) hypertension: Secondary | ICD-10-CM

## 2013-08-02 DIAGNOSIS — R059 Cough, unspecified: Secondary | ICD-10-CM

## 2013-08-02 DIAGNOSIS — E785 Hyperlipidemia, unspecified: Secondary | ICD-10-CM

## 2013-08-02 DIAGNOSIS — R053 Chronic cough: Secondary | ICD-10-CM

## 2013-08-02 DIAGNOSIS — F329 Major depressive disorder, single episode, unspecified: Secondary | ICD-10-CM

## 2013-08-02 DIAGNOSIS — N186 End stage renal disease: Secondary | ICD-10-CM

## 2013-08-02 DIAGNOSIS — R609 Edema, unspecified: Secondary | ICD-10-CM

## 2013-08-02 DIAGNOSIS — R05 Cough: Secondary | ICD-10-CM

## 2013-08-02 NOTE — Patient Instructions (Signed)
Continue meds as up to now. For legs - try low strength compression stockings Look into criteria to see if you qualify for scooter or motorized wheelchair (there's a form you can bring me). Good to see you, return as needed or in 6 months for wellness exam.

## 2013-08-02 NOTE — Progress Notes (Signed)
BP 128/86  Pulse 84  Temp(Src) 97.5 F (36.4 C) (Oral)  Wt 214 lb (97.07 kg)   CC: 6 mo f/u  Subjective:    Patient ID: Ariel Smith, female    DOB: Aug 19, 1945, 68 y.o.   MRN: [id number]  HPI: Ariel Smith is a 68 y.o. female presenting on 08/02/2013 for Follow-up   CKD - peritoneal dialysis catheter placed 01/18/2013. To have at home. 9 hours every night. Sees [name].  Depression - last visit we started celexa 40mg  daily.  Feels better on celexa.  Cough - ongoing for last 1 year, getting worse.  Occasionally productive of clear phlegm.  No fevers/chills. Has appt with pulm Dr. Meredeth Ide 08/14/2013.    Noticing more discomfot with legs - has had a feall 3 months ago and since then having pain in posterior calf on right.  Has worn compression stockings in past.    H/o DVT x2 leading to PE - L leg. No results found for this basename: INR,  PROTIME  coumadin managed by dialysis center - sees renal once a month. Recently transfused 2 u pRBC.  Feels better after transfusion.  Relevant past medical, surgical, family and social history reviewed and updated as indicated.  Allergies and medications reviewed and updated. Current Outpatient Prescriptions on File Prior to Visit  Medication Sig  . albuterol (VENTOLIN HFA) 108 (90 BASE) MCG/ACT inhaler Inhale 2 puffs into the lungs every 6 (six) hours as needed for wheezing.  . budesonide-formoterol (SYMBICORT) 160-4.5 MCG/ACT inhaler Inhale 2 puffs into the lungs 2 (two) times daily.  . citalopram (CELEXA) 40 MG tablet Take 1 tablet (40 mg total) by mouth daily.  . diphenhydramine-acetaminophen (TYLENOL PM) 25-500 MG TABS Take 1 tablet by mouth at bedtime as needed.  . pantoprazole (PROTONIX) 40 MG tablet TAKE ONE TABLET BY MOUTH ONCE DAILY  . sevelamer carbonate (RENVELA) 800 MG tablet Take 800 mg by mouth as directed. 2 pills with snacks and 3 pills with full meals  . warfarin (COUMADIN) 2.5 MG tablet Take 2.5 mg by mouth daily.  . B  Complex-C (B-COMPLEX WITH VITAMIN C) tablet Take 1 tablet by mouth daily.  . B Complex-C-Folic Acid (DIALYVITE PO) Take 2,000 Int'l Units by mouth daily.  . Calcium Acetate 667 MG TABS Take 2 tablets by mouth 3 (three) times daily with meals.   No current facility-administered medications on file prior to visit.    Review of Systems Per HPI unless specifically indicated above    Objective:    BP 128/86  Pulse 84  Temp(Src) 97.5 F (36.4 C) (Oral)  Wt 214 lb (97.07 kg)  Physical Exam  Nursing note and vitals reviewed. Constitutional: She appears well-developed and well-nourished. No distress.  HENT:  Mouth/Throat: Oropharynx is clear and moist. No oropharyngeal exudate.  Cardiovascular: Normal rate, regular rhythm and intact distal pulses.   Murmur (faint SEM) heard. Pulmonary/Chest: Effort normal and breath sounds normal. No respiratory distress. She has no wheezes. She has no rales.  Musculoskeletal: She exhibits edema (nonpitting significant bilaterally).  1+ DP bilaterally Bilateral tender induration of lower medial legs without erythema  Skin: Skin is warm and dry. No rash noted.       Assessment & Plan:   Problem List Items Addressed This Visit   Depression     Stable - continue celexa 40mg  daily.    HTN (hypertension)     Chronic, stable now off ARB per renal rec.    Relevant Medications  lovastatin (MEVACOR) 20 MG tablet   HLD (hyperlipidemia)     Continue lovastatin.  Check FLP next fasting blood work in 6 mo.    Relevant Medications      lovastatin (MEVACOR) 20 MG tablet   ESRD (end stage renal disease)     Stable on peritoneal dialysis.  States checked monthly at dialysis center.    Chronic cough     Pending appt with Dr. Meredeth Smith pulm later in the month for further evaluation.    Bilateral leg edema - Primary     Nonpitting, possibly developing lymphedema. Stable pulses bilaterally. Recommended trial of low dose compression stockings - 20mmHg  knee high script provided today.        Follow up plan: Return in about 6 months (around 02/01/2014), or as needed, for annual exam, prior fasting for blood work.

## 2013-08-02 NOTE — Assessment & Plan Note (Signed)
Continue lovastatin.  Check FLP next fasting blood work in 6 mo.

## 2013-08-02 NOTE — Progress Notes (Signed)
Pre visit review using our clinic review tool, if applicable. No additional management support is needed unless otherwise documented below in the visit note. 

## 2013-08-02 NOTE — Assessment & Plan Note (Signed)
Pending appt with Dr. Meredeth Ide pulm later in the month for further evaluation.

## 2013-08-02 NOTE — Assessment & Plan Note (Signed)
Stable on peritoneal dialysis.  States checked monthly at dialysis center.

## 2013-08-02 NOTE — Assessment & Plan Note (Signed)
Nonpitting, possibly developing lymphedema. Stable pulses bilaterally. Recommended trial of low dose compression stockings - knee high script provided today.

## 2013-08-02 NOTE — Assessment & Plan Note (Signed)
Stable - continue celexa 40mg  daily.

## 2013-08-02 NOTE — Assessment & Plan Note (Signed)
Chronic, stable now off ARB per renal rec.

## 2013-08-02 NOTE — Telephone Encounter (Signed)
Relevant patient education assigned to patient using Emmi. ° °

## 2013-08-07 ENCOUNTER — Encounter: Payer: Self-pay | Admitting: *Deleted

## 2013-08-08 ENCOUNTER — Emergency Department: Payer: Self-pay | Admitting: Emergency Medicine

## 2013-08-08 LAB — COMPREHENSIVE METABOLIC PANEL
ALBUMIN: 3 g/dL — AB (ref 3.4–5.0)
ALK PHOS: 80 U/L
Anion Gap: 10 (ref 7–16)
BUN: 47 mg/dL — AB (ref 7–18)
Bilirubin,Total: 0.2 mg/dL (ref 0.2–1.0)
CALCIUM: 8.1 mg/dL — AB (ref 8.5–10.1)
CHLORIDE: 110 mmol/L — AB (ref 98–107)
Co2: 21 mmol/L (ref 21–32)
Creatinine: 7.94 mg/dL — ABNORMAL HIGH (ref 0.60–1.30)
EGFR (African American): 6 — ABNORMAL LOW
EGFR (Non-African Amer.): 5 — ABNORMAL LOW
Glucose: 130 mg/dL — ABNORMAL HIGH (ref 65–99)
Osmolality: 295 (ref 275–301)
Potassium: 3.9 mmol/L (ref 3.5–5.1)
SGOT(AST): 20 U/L (ref 15–37)
SGPT (ALT): 18 U/L (ref 12–78)
SODIUM: 141 mmol/L (ref 136–145)
TOTAL PROTEIN: 6.9 g/dL (ref 6.4–8.2)

## 2013-08-08 LAB — CBC WITH DIFFERENTIAL/PLATELET
BASOS ABS: 0.1 10*3/uL (ref 0.0–0.1)
Basophil %: 0.9 %
EOS ABS: 0.3 10*3/uL (ref 0.0–0.7)
EOS PCT: 3.2 %
HCT: 34.1 % — AB (ref 35.0–47.0)
HGB: 11.1 g/dL — ABNORMAL LOW (ref 12.0–16.0)
LYMPHS ABS: 1.1 10*3/uL (ref 1.0–3.6)
LYMPHS PCT: 13.4 %
MCH: 33.3 pg (ref 26.0–34.0)
MCHC: 32.5 g/dL (ref 32.0–36.0)
MCV: 103 fL — AB (ref 80–100)
Monocyte #: 0.6 x10 3/mm (ref 0.2–0.9)
Monocyte %: 7.1 %
NEUTROS ABS: 6 10*3/uL (ref 1.4–6.5)
Neutrophil %: 75.4 %
Platelet: 195 10*3/uL (ref 150–440)
RBC: 3.32 10*6/uL — ABNORMAL LOW (ref 3.80–5.20)
RDW: 19.2 % — ABNORMAL HIGH (ref 11.5–14.5)
WBC: 8 10*3/uL (ref 3.6–11.0)

## 2013-08-15 ENCOUNTER — Ambulatory Visit: Payer: Self-pay | Admitting: Specialist

## 2013-08-18 ENCOUNTER — Ambulatory Visit: Payer: Self-pay | Admitting: Family Medicine

## 2013-10-10 ENCOUNTER — Telehealth: Payer: Self-pay | Admitting: Family Medicine

## 2013-10-10 NOTE — Telephone Encounter (Signed)
Pt called wanting dr g to call her daughter Rosalin Hawking [phone] Regarding ms Kildow health.  Ms roesel stated her daughter Darl Pikes feels like ms bender is not being honest with her about her health.

## 2013-10-10 NOTE — Telephone Encounter (Signed)
plz get details from patient and ask is it ok to discuss health issues with daughter?

## 2013-10-11 NOTE — Telephone Encounter (Signed)
Spoke with patient. She said her daughter thinks she isn't being honest because she is on oxygen that Dr. Meredeth Ide put her on and she is still coughing. I advised her that I would be happy to schedule an appointment with her and her daughter to evaluate her cough or she could see Dr. Meredeth Ide since he was her pulmonologist and that is what her daughter was concerned about. She said she couldn't understand why her daughter felt that she wasn't being honest, but that she would talk to her again and possibly call back for an appt or go see Dr. Meredeth Ide.

## 2013-11-20 ENCOUNTER — Inpatient Hospital Stay: Payer: Self-pay | Admitting: Internal Medicine

## 2013-11-20 LAB — URINALYSIS, COMPLETE
BILIRUBIN, UR: NEGATIVE
Bacteria: NONE SEEN
Glucose,UR: NEGATIVE mg/dL (ref 0–75)
KETONE: NEGATIVE
LEUKOCYTE ESTERASE: NEGATIVE
Nitrite: NEGATIVE
Ph: 5 (ref 4.5–8.0)
Protein: 30
RBC,UR: 1 /HPF (ref 0–5)
Specific Gravity: 1.015 (ref 1.003–1.030)
Squamous Epithelial: 11

## 2013-11-20 LAB — CBC WITH DIFFERENTIAL/PLATELET
BASOS ABS: 0 10*3/uL (ref 0.0–0.1)
Basophil %: 0.4 %
EOS PCT: 1.1 %
Eosinophil #: 0.1 10*3/uL (ref 0.0–0.7)
HCT: 27.4 % — ABNORMAL LOW (ref 35.0–47.0)
HGB: 9.1 g/dL — ABNORMAL LOW (ref 12.0–16.0)
LYMPHS PCT: 7.9 %
Lymphocyte #: 0.7 10*3/uL — ABNORMAL LOW (ref 1.0–3.6)
MCH: 34.3 pg — ABNORMAL HIGH (ref 26.0–34.0)
MCHC: 33.3 g/dL (ref 32.0–36.0)
MCV: 103 fL — ABNORMAL HIGH (ref 80–100)
MONO ABS: 0.7 x10 3/mm (ref 0.2–0.9)
Monocyte %: 8 %
Neutrophil #: 7.4 10*3/uL — ABNORMAL HIGH (ref 1.4–6.5)
Neutrophil %: 82.6 %
PLATELETS: 194 10*3/uL (ref 150–440)
RBC: 2.66 10*6/uL — ABNORMAL LOW (ref 3.80–5.20)
RDW: 15.8 % — ABNORMAL HIGH (ref 11.5–14.5)
WBC: 9 10*3/uL (ref 3.6–11.0)

## 2013-11-20 LAB — BODY FLUID CELL COUNT WITH DIFFERENTIAL
BASOS ABS: 0 %
EOS PCT: 0 %
LYMPHS PCT: 14 %
Neutrophils: 30 %
Nucleated Cell Count: 360 /mm3
Other Cells BF: 0 %
Other Mononuclear Cells: 56 %

## 2013-11-20 LAB — COMPREHENSIVE METABOLIC PANEL
ALBUMIN: 3 g/dL — AB (ref 3.4–5.0)
Alkaline Phosphatase: 77 U/L
Anion Gap: 11 (ref 7–16)
BUN: 46 mg/dL — AB (ref 7–18)
Bilirubin,Total: 0.2 mg/dL (ref 0.2–1.0)
CALCIUM: 8.2 mg/dL — AB (ref 8.5–10.1)
CREATININE: 8.21 mg/dL — AB (ref 0.60–1.30)
Chloride: 106 mmol/L (ref 98–107)
Co2: 22 mmol/L (ref 21–32)
EGFR (Non-African Amer.): 5 — ABNORMAL LOW
GFR CALC AF AMER: 5 — AB
Glucose: 101 mg/dL — ABNORMAL HIGH (ref 65–99)
Osmolality: 290 (ref 275–301)
Potassium: 3.5 mmol/L (ref 3.5–5.1)
SGOT(AST): 24 U/L (ref 15–37)
SGPT (ALT): 18 U/L
SODIUM: 139 mmol/L (ref 136–145)
TOTAL PROTEIN: 7 g/dL (ref 6.4–8.2)

## 2013-11-20 LAB — T4, FREE: Free Thyroxine: 0.86 ng/dL (ref 0.76–1.46)

## 2013-11-20 LAB — LIPASE, BLOOD: Lipase: 154 U/L (ref 73–393)

## 2013-11-20 LAB — TSH: Thyroid Stimulating Horm: 5.39 u[IU]/mL — ABNORMAL HIGH

## 2013-11-20 LAB — PROTIME-INR
INR: 2
Prothrombin Time: 21.8 secs — ABNORMAL HIGH (ref 11.5–14.7)

## 2013-11-20 LAB — TROPONIN I
TROPONIN-I: 0.02 ng/mL
Troponin-I: <0.02 ng/mL

## 2013-11-21 LAB — COMPREHENSIVE METABOLIC PANEL
ALK PHOS: 75 U/L
ALT: 19 U/L
ANION GAP: 8 (ref 7–16)
Albumin: 2.7 g/dL — ABNORMAL LOW (ref 3.4–5.0)
BUN: 48 mg/dL — AB (ref 7–18)
Bilirubin,Total: 0.2 mg/dL (ref 0.2–1.0)
CALCIUM: 7.8 mg/dL — AB (ref 8.5–10.1)
CO2: 22 mmol/L (ref 21–32)
CREATININE: 8.21 mg/dL — AB (ref 0.60–1.30)
Chloride: 110 mmol/L — ABNORMAL HIGH (ref 98–107)
EGFR (African American): 5 — ABNORMAL LOW
EGFR (Non-African Amer.): 5 — ABNORMAL LOW
GLUCOSE: 103 mg/dL — AB (ref 65–99)
Osmolality: 292 (ref 275–301)
Potassium: 3.1 mmol/L — ABNORMAL LOW (ref 3.5–5.1)
SGOT(AST): 23 U/L (ref 15–37)
SODIUM: 140 mmol/L (ref 136–145)
TOTAL PROTEIN: 6.4 g/dL (ref 6.4–8.2)

## 2013-11-21 LAB — CBC WITH DIFFERENTIAL/PLATELET
BASOS ABS: 0 10*3/uL (ref 0.0–0.1)
BASOS PCT: 0.3 %
EOS ABS: 0.2 10*3/uL (ref 0.0–0.7)
EOS PCT: 1.9 %
HCT: 25.9 % — ABNORMAL LOW (ref 35.0–47.0)
HGB: 8.8 g/dL — ABNORMAL LOW (ref 12.0–16.0)
Lymphocyte #: 1.1 10*3/uL (ref 1.0–3.6)
Lymphocyte %: 13.1 %
MCH: 34.2 pg — ABNORMAL HIGH (ref 26.0–34.0)
MCHC: 33.9 g/dL (ref 32.0–36.0)
MCV: 101 fL — ABNORMAL HIGH (ref 80–100)
MONOS PCT: 8 %
Monocyte #: 0.7 x10 3/mm (ref 0.2–0.9)
NEUTROS ABS: 6.6 10*3/uL — AB (ref 1.4–6.5)
NEUTROS PCT: 76.7 %
Platelet: 186 10*3/uL (ref 150–440)
RBC: 2.56 10*6/uL — AB (ref 3.80–5.20)
RDW: 15.4 % — AB (ref 11.5–14.5)
WBC: 8.6 10*3/uL (ref 3.6–11.0)

## 2013-11-21 LAB — MAGNESIUM: Magnesium: 1.3 mg/dL — ABNORMAL LOW

## 2013-11-21 LAB — TROPONIN I: Troponin-I: 0.02 ng/mL

## 2013-11-21 LAB — HEMOGLOBIN A1C: Hemoglobin A1C: 5.4 % (ref 4.2–6.3)

## 2013-11-21 LAB — PROTIME-INR
INR: 2
PROTHROMBIN TIME: 22.4 s — AB (ref 11.5–14.7)

## 2013-11-22 LAB — URINE CULTURE

## 2013-11-23 LAB — STOOL CULTURE

## 2013-12-04 ENCOUNTER — Encounter: Payer: Self-pay | Admitting: Family Medicine

## 2013-12-04 ENCOUNTER — Ambulatory Visit (INDEPENDENT_AMBULATORY_CARE_PROVIDER_SITE_OTHER): Payer: Medicare Other | Admitting: Family Medicine

## 2013-12-04 VITALS — BP 122/78 | HR 84 | Temp 97.7°F | Wt 200.8 lb

## 2013-12-04 DIAGNOSIS — N186 End stage renal disease: Secondary | ICD-10-CM

## 2013-12-04 DIAGNOSIS — A088 Other specified intestinal infections: Secondary | ICD-10-CM

## 2013-12-04 DIAGNOSIS — Z992 Dependence on renal dialysis: Secondary | ICD-10-CM

## 2013-12-04 DIAGNOSIS — I10 Essential (primary) hypertension: Secondary | ICD-10-CM

## 2013-12-04 DIAGNOSIS — A084 Viral intestinal infection, unspecified: Secondary | ICD-10-CM | POA: Insufficient documentation

## 2013-12-04 NOTE — Assessment & Plan Note (Signed)
Recent viral gastro leading to dehydration with hypotension in ESRD patient on peritoneal dialysis which led to hospitalization. Continues recovering. Reviewed preventative measures for future episode including avoiding hot weather as well as staying well hydrated and staying in [facility]. Pt agrees. Has labwork scheduled for next month at dialysis center.

## 2013-12-04 NOTE — Assessment & Plan Note (Signed)
bp stable today. Off antihypertensives currently.

## 2013-12-04 NOTE — Progress Notes (Signed)
BP 122/78  Pulse 84  Temp(Src) 97.7 F (36.5 C) (Oral)  Wt 200 lb 12 oz (91.06 kg)   CC: hosp f/u  Subjective:    Patient ID: Ariel Smith, female    DOB: 12-Feb-1946, 68 y.o.   MRN: [id number]  HPI: Ariel Smith is a 68 y.o. female presenting on 12/04/2013 for Follow-up   Corryn presents alone today for hosp f/u visit for dizziness.  Admitted 8/3-07/2013 with dizziness likely 2/2 hypotension +/- dehydration, diarrhea thought 2/2 viral gastro, hyperkalemia and magnesemia. bp down to 77/69 Records reviewed: WBC 8.6, Hgb 8.8, plt 186k, K 3.1, Cr 8.2, glu 103, cal 10.8, LFTs WNL as far as I can tell, INR 2.0, Mg 1.8, Hgb A1c 5.4%, TnI negative. UCx mixed bacteria suggesting contamiation. Stool cultures negative. CT head - atrophy with chronic small vessel ischemic changes. CT abd/pelvis - mild loculated ascites thought 2/2 dialysis (peritoneal), o/w no acute process Peritoneal fluid without signs of infection (paracentesis) She had been out in hot sun, also with viral gastroenteritis. Evaluated by Dr. Cherylann Ratel in hospital.  Since she's been home, feeling better. Currently doing nightly peritoneal dialysis every other day QOD 9 hour session. Sees Dr. Wynelle Link monthly. Next appt is September. Stays fatigued. + dyspnea and abdominal discomfort.   Chronic tremor, does have fmhx parkinson's disease. Denies memory trouble. + instability. No falls in last year. Doesn't notice tremor other than at left hand. Denies anosmia.  Driving less - preacher's wife drove her here today.  COPD - cannot afford albuterol. Taking symbicort 2 puffs bid. Gets this through dialysis center.  Coumadin is checked at dialysis center at her monthly visit.  H/o: DVT of lower extremity Date: 2009 left sided x2, with PE s/p IVC filter placement (coumadin followed by HD center)  ESRD (end stage renal disease) Date: 08/2011 [facility], L forearm AV fistula, [name] Secondary hyperparathyroidism of renal origin  Osteopenia Date: 01/2013  Peritoneal dialysis status Date: 02/2013 [name]  Relevant past medical, surgical, family and social history reviewed and updated as indicated.  Allergies and medications reviewed and updated. Current Outpatient Prescriptions on File Prior to Visit  Medication Sig  . albuterol (VENTOLIN HFA) 108 (90 BASE) MCG/ACT inhaler Inhale 2 puffs into the lungs every 6 (six) hours as needed for wheezing.  . B Complex-C-Folic Acid (DIALYVITE PO) Take 2,000 Int'l Units by mouth daily.  . budesonide-formoterol (SYMBICORT) 160-4.5 MCG/ACT inhaler Inhale 2 puffs into the lungs 2 (two) times daily.  . citalopram (CELEXA) 40 MG tablet Take 1 tablet (40 mg total) by mouth daily.  . diphenhydramine-acetaminophen (TYLENOL PM) 25-500 MG TABS Take 1 tablet by mouth at bedtime as needed.  . lovastatin (MEVACOR) 20 MG tablet Take 20 mg by mouth at bedtime.  . pantoprazole (PROTONIX) 40 MG tablet TAKE ONE TABLET BY MOUTH ONCE DAILY  . sevelamer carbonate (RENVELA) 800 MG tablet Take 800 mg by mouth as directed. 2 pills with snacks and 3 pills with full meals  . warfarin (COUMADIN) 2.5 MG tablet Take 2.5 mg by mouth daily.  . B Complex-C (B-COMPLEX WITH VITAMIN C) tablet Take 1 tablet by mouth daily.  . Calcium Acetate 667 MG TABS Take 2 tablets by mouth 3 (three) times daily with meals.   No current facility-administered medications on file prior to visit.    Review of Systems Per HPI unless specifically indicated above    Objective:    BP 122/78  Pulse 84  Temp(Src) 97.7 F (36.5 C) (  Oral)  Wt 200 lb 12 oz (91.06 kg)  Physical Exam  Nursing note and vitals reviewed. Constitutional: She appears well-developed and well-nourished. No distress.  HENT:  Mouth/Throat: Oropharynx is clear and moist. No oropharyngeal exudate.  Eyes: Conjunctivae and EOM are normal. Pupils are equal, round, and reactive to light. No scleral icterus.  Neck: Normal range of motion. Neck supple.    Cardiovascular: Normal rate, regular rhythm and intact distal pulses.   Murmur (3/6 SEM) heard. Pulmonary/Chest: Effort normal and breath sounds normal. No respiratory distress. She has no wheezes. She has no rales.  Musculoskeletal: She exhibits no edema.  Neurological:  hand resting tremor localized to left forearm/hand No masked fascies   Results for orders placed in visit on 08/07/13  HEMOGLOBIN      Result Value Ref Range   HGB 9.0    IRON AND TIBC      Result Value Ref Range   Ferritin 1004     Iron Saturation 38     Iron 77     TIBC 202    CALCIUM      Result Value Ref Range   Calcium 8.4     Phosphorus 4.9  2.5 - 4.9 mg/dL   Alkaline Phosphatase 67     PTH 504    CREATININE, SERUM      Result Value Ref Range   Creat 7.80    CBC      Result Value Ref Range   WBC 7.4     MCV 105.9     platelet count 244        Assessment & Plan:   Problem List Items Addressed This Visit   HTN (hypertension)     bp stable today. Off antihypertensives currently.    ESRD (end stage renal disease)   Peritoneal dialysis status   Viral gastroenteritis - Primary     Recent viral gastro leading to dehydration with hypotension in ESRD patient on peritoneal dialysis which led to hospitalization. Continues recovering. Reviewed preventative measures for future episode including avoiding hot weather as well as staying well hydrated and [facility]. Has labwork scheduled for next month at dialysis center.        Follow up plan: Return as needed.

## 2013-12-04 NOTE — Progress Notes (Signed)
Pre visit review using our clinic review tool, if applicable. No additional management support is needed unless otherwise documented below in the visit note. 

## 2013-12-04 NOTE — Patient Instructions (Signed)
Check about hemoglobin or anemia causing fatigue. Continue meds as up to now.  Push water to make sure you stay well hydrated, especially when outside in sun. Avoid sun, stay in [facility] when you can.

## 2013-12-19 HISTORY — PX: OTHER SURGICAL HISTORY: SHX169

## 2013-12-19 HISTORY — PX: US ECHOCARDIOGRAPHY: HXRAD669

## 2014-01-08 ENCOUNTER — Ambulatory Visit: Payer: Self-pay | Admitting: Specialist

## 2014-01-08 LAB — BODY FLUID CELL COUNT WITH DIFFERENTIAL
Basophil: 0 %
EOS PCT: 0 %
Lymphocytes: 8 %
NEUTROS PCT: 31 %
Nucleated Cell Count: 141 /mm3
OTHER CELLS BF: 0 %
Other Mononuclear Cells: 61 %

## 2014-01-08 LAB — PROTEIN, BODY FLUID: Protein, Body Fluid: 0.5 g/dL

## 2014-01-08 LAB — GLUCOSE, SEROUS FLUID: Glucose, Body Fluid: 105 mg/dL

## 2014-01-08 LAB — LACTATE DEHYDROGENASE, PLEURAL OR PERITONEAL FLUID: LDH, Body Fluid: 26 U/L

## 2014-01-08 LAB — PROTIME-INR
INR: 1.1
Prothrombin Time: 14.4 secs (ref 11.5–14.7)

## 2014-01-11 ENCOUNTER — Inpatient Hospital Stay: Payer: Self-pay | Admitting: Internal Medicine

## 2014-01-11 LAB — HEPATIC FUNCTION PANEL A (ARMC)
ALK PHOS: 70 U/L
Albumin: 3.3 g/dL — ABNORMAL LOW (ref 3.4–5.0)
BILIRUBIN TOTAL: 0.2 mg/dL (ref 0.2–1.0)
Bilirubin, Direct: <0.1 mg/dL (ref 0.00–0.20)
SGOT(AST): 21 U/L (ref 15–37)
SGPT (ALT): 15 U/L
Total Protein: 6.9 g/dL (ref 6.4–8.2)

## 2014-01-11 LAB — CBC
HCT: 27 % — ABNORMAL LOW (ref 35.0–47.0)
HGB: 9 g/dL — AB (ref 12.0–16.0)
MCH: 34.7 pg — ABNORMAL HIGH (ref 26.0–34.0)
MCHC: 33.3 g/dL (ref 32.0–36.0)
MCV: 105 fL — ABNORMAL HIGH (ref 80–100)
PLATELETS: 177 10*3/uL (ref 150–440)
RBC: 2.59 10*6/uL — AB (ref 3.80–5.20)
RDW: 15.2 % — ABNORMAL HIGH (ref 11.5–14.5)
WBC: 8 10*3/uL (ref 3.6–11.0)

## 2014-01-11 LAB — BASIC METABOLIC PANEL
Anion Gap: 9 (ref 7–16)
BUN: 42 mg/dL — ABNORMAL HIGH (ref 7–18)
CHLORIDE: 109 mmol/L — AB (ref 98–107)
Calcium, Total: 8.5 mg/dL (ref 8.5–10.1)
Co2: 24 mmol/L (ref 21–32)
Creatinine: 6.27 mg/dL — ABNORMAL HIGH (ref 0.60–1.30)
EGFR (African American): 9 — ABNORMAL LOW
EGFR (Non-African Amer.): 7 — ABNORMAL LOW
Glucose: 106 mg/dL — ABNORMAL HIGH (ref 65–99)
OSMOLALITY: 294 (ref 275–301)
POTASSIUM: 3.7 mmol/L (ref 3.5–5.1)
Sodium: 142 mmol/L (ref 136–145)

## 2014-01-11 LAB — PROTIME-INR
INR: 1.1
Prothrombin Time: 14.1 secs (ref 11.5–14.7)

## 2014-01-11 LAB — TROPONIN I: TROPONIN-I: 0.02 ng/mL

## 2014-01-11 LAB — APTT: Activated PTT: 32.3 secs (ref 23.6–35.9)

## 2014-01-11 LAB — PRO B NATRIURETIC PEPTIDE: B-Type Natriuretic Peptide: 3437 pg/mL — ABNORMAL HIGH (ref 0–125)

## 2014-01-12 LAB — URINALYSIS, COMPLETE
Bilirubin,UR: NEGATIVE
Glucose,UR: 50 mg/dL (ref 0–75)
Ketone: NEGATIVE
Leukocyte Esterase: NEGATIVE
NITRITE: NEGATIVE
PH: 7 (ref 4.5–8.0)
Protein: 100
RBC,UR: 1 /HPF (ref 0–5)
SPECIFIC GRAVITY: 1.014 (ref 1.003–1.030)

## 2014-01-12 LAB — BODY FLUID CULTURE

## 2014-01-13 LAB — PHOSPHORUS: Phosphorus: 5.7 mg/dL — ABNORMAL HIGH (ref 2.5–4.9)

## 2014-01-13 LAB — TSH: Thyroid Stimulating Horm: 4.2 u[IU]/mL

## 2014-01-15 LAB — PLATELET COUNT: Platelet: 149 10*3/uL — ABNORMAL LOW (ref 150–440)

## 2014-01-27 ENCOUNTER — Other Ambulatory Visit: Payer: Self-pay | Admitting: Family Medicine

## 2014-01-27 DIAGNOSIS — E785 Hyperlipidemia, unspecified: Secondary | ICD-10-CM

## 2014-01-27 DIAGNOSIS — I10 Essential (primary) hypertension: Secondary | ICD-10-CM

## 2014-01-27 DIAGNOSIS — N186 End stage renal disease: Secondary | ICD-10-CM

## 2014-01-27 DIAGNOSIS — E538 Deficiency of other specified B group vitamins: Secondary | ICD-10-CM | POA: Insufficient documentation

## 2014-01-29 ENCOUNTER — Other Ambulatory Visit: Payer: Medicare Other

## 2014-01-29 LAB — CULTURE, FUNGUS WITHOUT SMEAR

## 2014-02-01 ENCOUNTER — Encounter: Payer: Medicare Other | Admitting: Family Medicine

## 2014-02-06 ENCOUNTER — Encounter: Payer: Self-pay | Admitting: Family Medicine

## 2014-02-12 ENCOUNTER — Other Ambulatory Visit: Payer: Self-pay | Admitting: Family Medicine

## 2014-03-30 ENCOUNTER — Ambulatory Visit (INDEPENDENT_AMBULATORY_CARE_PROVIDER_SITE_OTHER): Payer: Medicare Other | Admitting: Family Medicine

## 2014-03-30 ENCOUNTER — Other Ambulatory Visit (HOSPITAL_COMMUNITY)
Admission: RE | Admit: 2014-03-30 | Discharge: 2014-03-30 | Disposition: A | Discharge status: Home / Self Care | Payer: Medicare Other | Source: Ambulatory Visit | Attending: Family Medicine | Admitting: Family Medicine

## 2014-03-30 ENCOUNTER — Encounter: Payer: Self-pay | Admitting: Family Medicine

## 2014-03-30 VITALS — BP 140/80 | HR 88 | Temp 98.0°F | Ht 64.0 in | Wt 209.0 lb

## 2014-03-30 DIAGNOSIS — E538 Deficiency of other specified B group vitamins: Secondary | ICD-10-CM

## 2014-03-30 DIAGNOSIS — N186 End stage renal disease: Secondary | ICD-10-CM

## 2014-03-30 DIAGNOSIS — F329 Major depressive disorder, single episode, unspecified: Secondary | ICD-10-CM

## 2014-03-30 DIAGNOSIS — I10 Essential (primary) hypertension: Secondary | ICD-10-CM

## 2014-03-30 DIAGNOSIS — Z7189 Other specified counseling: Secondary | ICD-10-CM | POA: Insufficient documentation

## 2014-03-30 DIAGNOSIS — E785 Hyperlipidemia, unspecified: Secondary | ICD-10-CM

## 2014-03-30 DIAGNOSIS — M8580 Other specified disorders of bone density and structure, unspecified site: Secondary | ICD-10-CM

## 2014-03-30 DIAGNOSIS — Z1151 Encounter for screening for human papillomavirus (HPV): Secondary | ICD-10-CM | POA: Diagnosis present

## 2014-03-30 DIAGNOSIS — Z1239 Encounter for other screening for malignant neoplasm of breast: Secondary | ICD-10-CM

## 2014-03-30 DIAGNOSIS — Z124 Encounter for screening for malignant neoplasm of cervix: Secondary | ICD-10-CM | POA: Diagnosis present

## 2014-03-30 DIAGNOSIS — Z0000 Encounter for general adult medical examination without abnormal findings: Secondary | ICD-10-CM

## 2014-03-30 DIAGNOSIS — Z992 Dependence on renal dialysis: Secondary | ICD-10-CM

## 2014-03-30 DIAGNOSIS — F32A Depression, unspecified: Secondary | ICD-10-CM

## 2014-03-30 DIAGNOSIS — Z0001 Encounter for general adult medical examination with abnormal findings: Secondary | ICD-10-CM | POA: Insufficient documentation

## 2014-03-30 MED ORDER — TRAMADOL HCL 50 MG PO TABS: 25.0000 mg | ORAL_TABLET | Freq: Two times a day (BID) | ORAL | Status: DC | PRN

## 2014-03-30 MED ORDER — CITALOPRAM HYDROBROMIDE 20 MG PO TABS: 20.0000 mg | ORAL_TABLET | Freq: Every day | ORAL | Status: DC

## 2014-03-30 NOTE — Assessment & Plan Note (Signed)

## 2014-03-30 NOTE — Assessment & Plan Note (Signed)
Deteriorated again - off celexa for last 2 months. Suggested lower dose to see if effective - celexa 20mg  daily sent to pharmacy, discussed SSRI will take 1 month to take effect. Pt denies SI/HI.

## 2014-03-30 NOTE — Progress Notes (Signed)
Pre visit review using our clinic review tool, if applicable. No additional management support is needed unless otherwise documented below in the visit note. 

## 2014-03-30 NOTE — Assessment & Plan Note (Signed)
Check FLP today. Continue lovastatin.

## 2014-03-30 NOTE — Progress Notes (Addendum)
BP 140/80 mmHg  Pulse 88  Temp(Src) 98 F (36.7 C) (Oral)  Ht 5\' 4"  (1.626 m)  Wt 209 lb (94.802 kg)  BMI 35.86 kg/m2   CC: medicare wellness visit  Subjective:    Patient ID: Ariel Smith, female    DOB: 1945/11/16, 68 y.o.   MRN: [id number]  HPI: Ariel Smith is a 68 y.o. female presenting on 03/30/2014 for Annual Exam   ESRD on HD [facility] - PD stopped 2/2 recurrent pleural effusions - now on HD Thedore Mins). Brings records from hospital.   Coumadin for h/o DVT x2 with PE s/p IVC filter placement, managed by HD center.  Failed hearing screen - declines audiologist referral. Passes vision No falls in last year. + screen for depression - celexa 40mg  daily. Ran out for last 2 months.stopped cold [place]. States we never refilled but our chart says we sent refill 02/12/2014 #[id number] 0RF. Needs refill. PHQ9 = 13.  Preventative: COLONOSCPY Date: 08/2011 colon biopsies, [name] Mammogram - daughter has breast cancer. Pt requests mammogram scheduled. Well woman exam - pap today and if normal will stop. No vaginal bleeding or pelvic pain. Menopause at age 11. DEXA Date: 01/2013 osteopenia with -2.0 at hip and spine Flu shot - at HD center Pneumovax - 01/2012, prevnar thinks at HD center Advanced directives - daughter is HCPOA. Will bring [place] copy which daughter has at home.  Relevant past medical, surgical, family and social history reviewed and updated as indicated. Interim medical history since our last visit reviewed. Allergies and medications reviewed and updated.  Current Outpatient Prescriptions on File Prior to Visit  Medication Sig  . acetaminophen (TYLENOL) 500 MG tablet Take 1,000 mg by mouth every 6 (six) hours as needed.  Marland Kitchen albuterol (VENTOLIN HFA) 108 (90 BASE) MCG/ACT inhaler Inhale 2 puffs into the lungs every 6 (six) hours as needed for wheezing.  . B Complex-C (B-COMPLEX WITH VITAMIN C) tablet Take 1 tablet by mouth daily.  . budesonide-formoterol (SYMBICORT)  160-4.5 MCG/ACT inhaler Inhale 2 puffs into the lungs 2 (two) times daily.  . calcitRIOL (ROCALTROL) 0.25 MCG capsule Take 0.25 mcg by mouth daily.  . Calcium Acetate 667 MG TABS Take 2 tablets by mouth 3 (three) times daily with meals.  . diphenhydramine-acetaminophen (TYLENOL PM) 25-500 MG TABS Take 1 tablet by mouth at bedtime as needed.  . folic acid-vitamin b complex-vitamin c-selenium-zinc (DIALYVITE) 3 MG TABS tablet Take 1 tablet by mouth daily.  Marland Kitchen lovastatin (MEVACOR) 20 MG tablet Take 20 mg by mouth at bedtime.  . pantoprazole (PROTONIX) 40 MG tablet TAKE ONE TABLET BY MOUTH ONCE DAILY  . promethazine (PHENERGAN) 12.5 MG tablet Take 12.5 mg by mouth every 6 (six) hours as needed for nausea or vomiting.  . sevelamer carbonate (RENVELA) 800 MG tablet Take 800 mg by mouth as directed. 2 pills with snacks and 3 pills with full meals  . warfarin (COUMADIN) 2.5 MG tablet Take 2.5 mg by mouth daily.   No current facility-administered medications on file prior to visit.    Review of Systems  Constitutional: Negative for fever, chills, activity change, appetite change, fatigue and unexpected weight change.  HENT: Negative for hearing loss.   Eyes: Negative for visual disturbance.  Respiratory: Positive for cough (chronic) and shortness of breath. Negative for chest tightness and wheezing.   Cardiovascular: Negative for chest pain, palpitations and leg swelling.  Gastrointestinal: Positive for diarrhea (occasional). Negative for nausea, vomiting, abdominal pain, constipation, blood in stool and  abdominal distention.  Genitourinary: Negative for hematuria and difficulty urinating.  Musculoskeletal: Negative for myalgias, arthralgias and neck pain.  Skin: Negative for rash.  Neurological: Negative for dizziness, seizures, syncope and headaches.  Hematological: Negative for adenopathy. Bruises/bleeds easily (coumadin related).  Psychiatric/Behavioral: Positive for dysphoric mood (denies  SI/HI). The patient is not nervous/anxious.    Per HPI unless specifically indicated above     Objective:    BP 140/80 mmHg  Pulse 88  Temp(Src) 98 F (36.7 C) (Oral)  Ht 5\' 4"  (1.626 m)  Wt 209 lb (94.802 kg)  BMI 35.86 kg/m2  Wt Readings from Last 3 Encounters:  03/30/14 209 lb (94.802 kg)  12/04/13 200 lb 12 oz (91.06 kg)  08/02/13 214 lb (97.07 kg)    Physical Exam  Constitutional: She is oriented to person, place, and time. She appears well-developed and well-nourished. No distress.  HENT:  Head: Normocephalic and atraumatic.  Right Ear: Hearing, tympanic membrane, external ear and ear canal normal.  Left Ear: Hearing, tympanic membrane, external ear and ear canal normal.  Nose: Nose normal.  Mouth/Throat: Uvula is midline, oropharynx is clear and moist and mucous membranes are normal. No oropharyngeal exudate, posterior oropharyngeal edema or posterior oropharyngeal erythema.  Eyes: Conjunctivae and EOM are normal. Pupils are equal, round, and reactive to light. No scleral icterus.  Neck: Normal range of motion. Neck supple. Carotid bruit is not present. No thyromegaly present.  Cardiovascular: Normal rate, regular rhythm and intact distal pulses.   Murmur (3/6 SEM best at LUSB with radiation to carotids) heard. Pulses:      Radial pulses are 2+ on the right side, and 2+ on the left side.  Pulmonary/Chest: Effort normal and breath sounds normal. No respiratory distress. She has no wheezes. She has no rales. Right breast exhibits no inverted nipple, no mass, no nipple discharge, no skin change and no tenderness. Left breast exhibits no inverted nipple, no mass, no nipple discharge, no skin change and no tenderness.  Abdominal: Soft. Bowel sounds are normal. She exhibits no distension and no mass. There is no tenderness. There is no rebound and no guarding.  Genitourinary: Vagina normal and uterus normal. Pelvic exam was performed with patient supine. There is no rash,  tenderness or lesion on the right labia. There is no rash, tenderness or lesion on the left labia. Cervix exhibits no motion tenderness, no discharge and no friability. Right adnexum displays no mass, no tenderness and no fullness. Left adnexum displays no mass, no tenderness and no fullness.  Pap performed on cervix  Musculoskeletal: Normal range of motion. She exhibits no edema.  Lymphadenopathy:    She has no cervical adenopathy.  Neurological: She is alert and oriented to person, place, and time.  CN grossly intact, station and gait intact Recall 3/3 Calculation 5/5 D-L-R-O-W  Skin: Skin is warm and dry. No rash noted.  Psychiatric: She has a normal mood and affect. Her behavior is normal. Judgment and thought content normal.  Nursing note and vitals reviewed.  Results for orders placed or performed in visit on 08/07/13  Hemoglobin  Result Value Ref Range   HGB 9.0 g/dL  Iron and TIBC  Result Value Ref Range   Ferritin 1004    Iron Saturation 38    Iron 77    TIBC 202   Calcium  Result Value Ref Range   Calcium 8.4 mg/dL   Phosphorus 4.9 2.5 - 4.9 mg/dL   Alkaline Phosphatase 67 U/L   PTH 504  Creatinine, serum  Result Value Ref Range   Creat 7.80   CBC  Result Value Ref Range   WBC 7.4    MCV 105.9 fL   platelet count 244       Assessment & Plan:  Handicap placard form filled out. Problem List Items Addressed This Visit    Vitamin B12 deficiency    recheck levels today.    Relevant Orders      Vitamin B12   Osteopenia    Consider rpt dexa 2016-2017.    Medicare annual wellness visit, subsequent - Primary    I have personally reviewed the Medicare Annual Wellness questionnaire and have noted 1. The patient's medical and social history 2. Their use of alcohol, tobacco or illicit drugs 3. Their current medications and supplements 4. The patient's functional ability including ADL's, fall risks, home safety risks and hearing or visual impairment. 5. Diet and  physical activity 6. Evidence for depression or mood disorders The patients weight, height, BMI have been recorded in the chart.  Hearing and vision has been addressed. I have made referrals, counseling and provided education to the patient based review of the above and I have provided the pt with a written personalized care plan for preventive services. Provider list updated - see scanned questionairre.  Reviewed preventative protocols and updated unless pt declined.    HTN (hypertension)    Chronic, stable off meds. Continue to monitor.    HLD (hyperlipidemia)    Check FLP today. Continue lovastatin.    Relevant Orders      Lipid panel   Health maintenance examination    Preventative protocols reviewed and updated unless pt declined. Discussed healthy diet and lifestyle.  Breast exam, pelvic exam with pap performed today.    ESRD (end stage renal disease) on dialysis    Now goes to center [facility] Thedore Mins). Check labs today.    Depression    Deteriorated again - off celexa for last 2 months. Suggested lower dose to see if effective - celexa 20mg  daily sent to pharmacy, discussed SSRI will take 1 month to take effect. Pt denies SI/HI.     Relevant Medications      citalopram (CELEXA) tablet   Advanced care planning/counseling discussion    Advanced directives - daughter is HCPOA. Will bring [place] copy which daughter has at home.     Other Visit Diagnoses    Screening for breast cancer        Relevant Orders       MM Digital Screening        Follow up plan: Return in about 1 year (around 03/31/2015), or as needed, for medicare wellness.   ADDENDUM ==> [facility] records reviewed for recurrent thoracentesis LFTs WNL, BNP 3437, WBC 8, plt 177, Hgb 9, glu 106, BUN 42, Cr 6.2.  2D echo performed asked to scan.

## 2014-03-30 NOTE — Assessment & Plan Note (Signed)
Now goes to center [facility] Thedore Mins). Check labs today.

## 2014-03-30 NOTE — Assessment & Plan Note (Signed)
recheck levels today.

## 2014-03-30 NOTE — Assessment & Plan Note (Signed)
Advanced directives - daughter is HCPOA. Will bring [place] copy which daughter has at home.

## 2014-03-30 NOTE — Assessment & Plan Note (Signed)
Consider rpt dexa 2016-2017.

## 2014-03-30 NOTE — Patient Instructions (Addendum)
Start 1 tablet daily celexa 20mg  for mood. Call me in 1 month with an update on your mood.  Bring [place] a copy of your advanced directive (daughter). We will call you to schedule mammogram. Good to see you today, call us with questions. If normal pap today we can stop pap smears.

## 2014-03-30 NOTE — Addendum Note (Signed)
Addended by: Annamarie Major on: 03/30/2014 05:40 PM   Modules accepted: Orders

## 2014-03-30 NOTE — Assessment & Plan Note (Signed)
Chronic, stable off meds.  Continue to monitor. 

## 2014-03-30 NOTE — Assessment & Plan Note (Signed)
Preventative protocols reviewed and updated unless pt declined. Discussed healthy diet and lifestyle.  Breast exam, pelvic exam with pap performed today.

## 2014-03-31 LAB — RENAL FUNCTION PANEL
Albumin: 4.2 g/dL (ref 3.5–5.2)
BUN: 38 mg/dL — AB (ref 6–23)
CO2: 29 mEq/L (ref 19–32)
Calcium: 9.3 mg/dL (ref 8.4–10.5)
Chloride: 98 mEq/L (ref 96–112)
Creat: 6.17 mg/dL — ABNORMAL HIGH (ref 0.50–1.10)
Glucose, Bld: 104 mg/dL — ABNORMAL HIGH (ref 70–99)
Phosphorus: 6 mg/dL — ABNORMAL HIGH (ref 2.3–4.6)
Potassium: 4.3 mEq/L (ref 3.5–5.3)
SODIUM: 140 meq/L (ref 135–145)

## 2014-03-31 LAB — CBC WITH DIFFERENTIAL/PLATELET
Basophils Absolute: 0 10*3/uL (ref 0.0–0.1)
Basophils Relative: 0 % (ref 0–1)
Eosinophils Absolute: 0.1 10*3/uL (ref 0.0–0.7)
Eosinophils Relative: 2 % (ref 0–5)
HEMATOCRIT: 34 % — AB (ref 36.0–46.0)
HEMOGLOBIN: 11.6 g/dL — AB (ref 12.0–15.0)
LYMPHS ABS: 1.5 10*3/uL (ref 0.7–4.0)
Lymphocytes Relative: 22 % (ref 12–46)
MCH: 34.5 pg — ABNORMAL HIGH (ref 26.0–34.0)
MCHC: 34.1 g/dL (ref 30.0–36.0)
MCV: 101.2 fL — AB (ref 78.0–100.0)
MONO ABS: 0.8 10*3/uL (ref 0.1–1.0)
MONOS PCT: 11 % (ref 3–12)
MPV: 10.1 fL (ref 9.4–12.4)
NEUTROS ABS: 4.5 10*3/uL (ref 1.7–7.7)
Neutrophils Relative %: 65 % (ref 43–77)
Platelets: 276 10*3/uL (ref 150–400)
RBC: 3.36 MIL/uL — ABNORMAL LOW (ref 3.87–5.11)
RDW: 14.8 % (ref 11.5–15.5)
WBC: 6.9 10*3/uL (ref 4.0–10.5)

## 2014-03-31 LAB — LIPID PANEL
CHOL/HDL RATIO: 4.7 ratio
Cholesterol: 155 mg/dL (ref 0–200)
HDL: 33 mg/dL — AB (ref >39)
LDL Cholesterol: 67 mg/dL (ref 0–99)
TRIGLYCERIDES: 275 mg/dL — AB (ref <150)
VLDL: 55 mg/dL — ABNORMAL HIGH (ref 0–40)

## 2014-03-31 LAB — VITAMIN D 25 HYDROXY (VIT D DEFICIENCY, FRACTURES): VIT D 25 HYDROXY: 13 ng/mL — AB (ref 30–100)

## 2014-03-31 LAB — VITAMIN B12: VITAMIN B 12: 381 pg/mL (ref 211–911)

## 2014-03-31 NOTE — Addendum Note (Signed)
Addended by: [name] on: 03/31/2014 10:49 AM   Modules accepted: Kipp Brood

## 2014-04-03 ENCOUNTER — Encounter: Payer: Self-pay | Admitting: Family Medicine

## 2014-04-03 LAB — CYTOLOGY - PAP

## 2014-04-03 NOTE — Addendum Note (Signed)
Addended by: [name] on: 04/03/2014 08:07 AM   Modules accepted: Kipp Brood

## 2014-04-04 ENCOUNTER — Encounter: Payer: Self-pay | Admitting: *Deleted

## 2014-04-05 ENCOUNTER — Encounter: Payer: Self-pay | Admitting: *Deleted

## 2014-04-06 ENCOUNTER — Encounter: Payer: Self-pay | Admitting: Family Medicine

## 2014-05-17 ENCOUNTER — Other Ambulatory Visit: Payer: Self-pay | Admitting: Family Medicine

## 2014-05-28 ENCOUNTER — Other Ambulatory Visit: Payer: Self-pay | Admitting: Family Medicine

## 2014-05-28 NOTE — Telephone Encounter (Signed)
Plz phone in

## 2014-05-28 NOTE — Telephone Encounter (Signed)
Ok to refill 

## 2014-05-29 NOTE — Telephone Encounter (Signed)
Rx called in as directed.   

## 2014-07-27 ENCOUNTER — Other Ambulatory Visit: Payer: Self-pay | Admitting: Family Medicine

## 2014-07-27 NOTE — Telephone Encounter (Signed)
plz phone in. 

## 2014-07-27 NOTE — Telephone Encounter (Signed)
Rx called in as directed.   

## 2014-07-27 NOTE — Telephone Encounter (Signed)
Ok to refill 

## 2014-08-07 NOTE — Op Note (Signed)
PATIENT NAME:  MISCHEL, RAFFERTY MR#:  [id number] DATE OF BIRTH:  04-Mar-1946  DATE OF PROCEDURE:  11/17/2011  PREOPERATIVE DIAGNOSES:  1. End-stage renal disease requiring hemodialysis.  2. Complication AV dialysis device.   POSTOPERATIVE DIAGNOSES: 1. End-stage renal disease requiring hemodialysis.  2. Complication AV dialysis device.   PROCEDURE PERFORMED: Removal of right IJ cuffed tunneled dialysis catheter.   SURGEON: Terrence Dupont, PA-C   ANESTHESIA: 1% local lidocaine.   DESCRIPTION OF PROCEDURE: The patient was positioned supine. Cuff was palpated. 1% lidocaine with epinephrine is infiltrated in the surrounding soft tissues and a small transverse incision is created. Cuff is then dissected free from the surrounding tissues. Catheter is transected. Hub assembly is removed without difficulty and the intravascular portion is then removed without difficulty. Pressure is held at the base of the neck. 4-0 Monocryl is used to close the incision. Dermabond is applied. Sterile dressing is applied to the exit site. The patient tolerated the procedure well and there are no immediate complications.   ____________________________ Terrence Dupont, PA-C jmk:[id number] D: 11/17/2011 10:21:38 ET T: 11/17/2011 12:35:56 ET JOB#: [id number]  cc: Terrence Dupont, PA-C, <Dictator> Terrence Dupont PA ELECTRONICALLY SIGNED 11/24/2011 10:30

## 2014-08-07 NOTE — Op Note (Signed)
PATIENT NAME:  Ariel Smith, Ariel Smith MR#:  [id number] DATE OF BIRTH:  Jan 02, 1946  DATE OF PROCEDURE:  01/14/2012  PREOPERATIVE DIAGNOSES:  1. End-stage renal disease.  2. Poorly functioning left arm AV fistula.  3. Hematoma left arm around AV fistula.  4. Hypertension.  5. Chronic obstructive pulmonary disease.   POSTOPERATIVE DIAGNOSES:  1. End-stage renal disease.  2. Poorly functioning left arm AV fistula.  3. Hematoma left arm around AV fistula.  4. Hypertension.  5. Chronic obstructive pulmonary disease.   PROCEDURES:  1. Ultrasound guidance for vascular access to left radiocephalic AV fistula.  2. Left upper extremity fistulogram and central venogram.  3. Percutaneous transluminal angioplasty with 7 and 8 mm angioplasty balloon to midforearm cephalic vein.   SURGEON: [name], MD  ANESTHESIA: Local with moderate conscious sedation.   ESTIMATED BLOOD LOSS: Minimal.   INDICATION FOR PROCEDURE: 69 year old white female on dialysis. She has a left radiocephalic AV fistula. There are two significant hematomas seen in the midforearm and the fistula has had poor function. She is also having some significant pain in the area. Fistulogram was performed for further evaluation.   DESCRIPTION OF PROCEDURE: Patient is brought to the vascular interventional radiology suite. Left upper extremity was sterilely prepped and draped, a sterile surgical field was created. The fistula was accessed several centimeters beyond the anastomosis with a micropuncture needle under direct ultrasound guidance. Permanent image was recorded. A micropuncture wire and sheath were placed. Imaging performed through this sheath showed what appeared to be a moderate amount of narrowing in the mid forearm cephalic vein somewhere in the 55% to 60% range. This did correlate with the area of the hematomas as well. Outflow was dual through the cephalic vein in the deep venous system and there was no stenosis more centrally. A  6 French sheath was placed. The patient was given heparin and this lesion was crossed with a Magic torque wire and 8 mm diameter x 6 cm length angioplasty balloon. Initial angioplasty balloon was 7 mm diameter high-pressure angioplasty balloon were inflated in this location. With balloon inflation imaging was allowed to assess the paraanastomotic region. There was a mild stenosis in the 40% to 50% range which did not appear flow limiting but was a borderline lesion just beyond the anastomosis in the swing portion. Completion fistulogram still showed some narrowing in the area although the lumen did appear to be improved after angioplasty. I suspect that the hematomas are causing some extrinsic compression in the area causing some of the narrowing. At this point I elected to terminate the procedure. The sheath was removed around a 4-0 Monocryl pursestring suture. Pressure was held. Sterile dressing was placed. The patient tolerated procedure well and was taken to the recovery room in stable condition.   ____________________________ [name], MD jsd:cms D: 01/14/2012 11:44:50 ET T: 01/14/2012 11:52:49 ET JOB#: [id number]  cc: [name], MD, <Dictator> Leo Grosser, MD Mosetta Pigeon, MD [name] MD ELECTRONICALLY SIGNED 01/14/2012 13:45

## 2014-08-10 NOTE — Op Note (Signed)
PATIENT NAME:  Ariel Smith, Ariel Smith MR#:  [id number] DATE OF BIRTH:  [birth year]  DATE OF PROCEDURE:  08/22/2012  DATE OF OPERATION: 08/22/2012.   PREOPERATIVE DIAGNOSES: 1.  End-stage renal disease.  2.  Decreased flow on transonic imaging at dialysis center, with poorly functioning arteriovenous fistula.  3.  Hypercoagulable state.  4.  Hypertension.   POSTOPERATIVE DIAGNOSES: 1.  End-stage renal disease.  2.  Decreased flow on transonic imaging at dialysis center, with poorly functioning arteriovenous fistula.  3.  Hypercoagulable state.  4.  Hypertension.   PROCEDURES: 1.  Ultrasound guidance for vascular access to left radiocephalic arteriovenous AV fistula.  2.  Left upper extremity fistulogram, and central venogram.   SURGEON: [name], M.D.   ANESTHESIA: Local, with moderate conscious sedation.   BLOOD LOSS: Minimal.   FLUOROSCOPY TIME: Approximately 1 minute, and 20 mL of contrast were used.   INDICATION FOR PROCEDURE: A 70 year old white female who has a dialysis access in the form of a left radiocephalic AV fistula. At her dialysis center they had decreased transonic flow of about 40%. We were asked to evaluate. A fistulogram was performed. Risks and benefits were discussed. Informed consent was obtained.     DESCRIPTION OF PROCEDURE: The patient was brought to the vascular interventional radiology suite. Left upper extremity was sterilely prepped and draped, and a sterile surgical field was created. The fistula was accessed to about 5 cm beyond the anastomosis. The micropuncture needle, micropuncture wire and sheath were placed. Imaging was performed through the micropuncture sheath. The fistula was compressed to evaluate the anastomosis, which was found to be patent. There was a 30% to 35% stenosis at the swing-point in the cephalic vein just beyond the anastomosis, but this was not flow-limiting. Imaging was then performed in-line through the micropuncture sheath, which  showed normal fistula flow without hemodynamically significant stenosis. There was dual outflow through the basilic vein, and the cephalic vein in the upper arm and central veins were patent. At this point I terminated the procedure. The sheath was removed around a 4-0 Monocryl pursestring suture. Pressure was held. Sterile dressing was placed. The patient tolerated the procedure well and was taken to the recovery room in stable condition.     ____________________________ [name], MD jsd:dm D: 08/22/2012 10:45:02 ET T: 08/22/2012 10:56:46 ET JOB#: [id number]  cc: [name], MD, <Dictator> [name] MD ELECTRONICALLY SIGNED 08/25/2012 15:51

## 2014-08-10 NOTE — Op Note (Signed)
PATIENT NAME:  Ariel Smith, Ariel Smith MR#:  [id number] DATE OF BIRTH:  09-27-1945  DATE OF PROCEDURE:  11/23/2012  PREOPERATIVE DIAGNOSES:  1.  Status post inferior vena cava filter placement.  2.  History of deep venous thrombosis.  3.  Hypercoagulable state.  4.  End-stage renal disease.   POSTOPERATIVE DIAGNOSES: 1.  Status post inferior vena cava filter placement.  2.  History of deep venous thrombosis.  3.  Hypercoagulable state.  4.  End-stage renal disease.   PROCEDURES:  1.  Ultrasound guidance for vascular access, left jugular vein.  2.  Catheter placement into inferior vena cava from left jugular venous approach.  3.  Retrieval of [employer] IVC filter.   SURGEON: [name], M.D.   ANESTHESIA: Local with moderate conscious sedation.   ESTIMATED BLOOD LOSS: Approximately 25 mL.  FLUOROSCOPY TIME: Approximately 2 minutes.   CONTRAST USED: 15 mL.   INDICATION FOR PROCEDURE: A 69 year old white female with the above-mentioned issues. She had an IVC filter placed over a year ago. She has a hypercoagulable state and is at high risk of thrombosis of her IVC with filter in place. For this reason, filter retrieval was recommended. Risks and benefits were discussed. Informed consent was obtained.   DESCRIPTION OF PROCEDURE: The patient was brought to the vascular interventional radiology suite. The neck and chest were sterilely prepped and draped and a sterile surgical field was created. We initially visualized the right neck and the jugular vein did not appear to be patent. I then turned my attention to the left jugular vein, which was widely patent. It was then accessed under direct ultrasound guidance without difficulty with a Seldinger needle and a J-wire was placed. After skin nick and dilatation, the delivery sheath was placed over the wire into the inferior vena cava and an inferior venacavogram was performed. This showed a patent IVC with the filter in good location below the  renal veins. I then was able to snare the filter without difficulty and collapsed the filter and the sheath, retrieving the filter and removing it in its entirety. The retrieval sheath was then removed. Pressure was held on the neck and a sterile dressing was placed. The patient tolerated the procedure well and was taken to the recovery room in stable condition.    ____________________________ [name], MD jsd:aw D: 11/23/2012 10:03:42 ET T: 11/23/2012 10:34:18 ET JOB#: [id number]  cc: [name], MD, <Dictator> [name] MD ELECTRONICALLY SIGNED 11/24/2012 8:18

## 2014-08-10 NOTE — Op Note (Signed)
PATIENT NAME:  Ariel Smith, Ariel Smith MR#:  [id number] DATE OF BIRTH:  April 12, 1946  DATE OF OPERATION:  01/18/2013  PREOPERATIVE DIAGNOSES: 1.  End-stage renal disease.  2.  Factor V Leiden, with history of deep venous thrombosis.  3.  Hypertension.   POSTOPERATIVE DIAGNOSES: 1.  End-stage renal disease.  2.  Factor V Leiden, with history of deep venous thrombosis.  3.  Hypertension.   PROCEDURE: Laparoscopic placement of peritoneal dialysis catheter.   SURGEON: Dew.   ANESTHESIA: General.   BLOOD LOSS: Minimal.   INDICATION FOR PROCEDURE: This is an individual with end-stage renal disease. She currently is getting hemodialysis, but desires transition to peritoneal dialysis. Risks and benefits of placement of catheter were discussed. Informed consent was obtained.   DESCRIPTION OF PROCEDURE: The patient was brought to the operative suite, and after an adequate level of general anesthesia was obtained the abdomen was sterilely prepped and draped and a sterile surgical field was created. The incision was created just to the left of the umbilicus. We dissected down to fascia and put in Vicryl sutures in pursestring fashion. I placed an Optiview 10 mm trocar in the right upper quadrant for direct visualization, then placed the catheter into the pelvis from the periumbilical incision.   The catheter was then secured to the fascia at the deep cuff with the Vicryl pursestring suture, brought out small stab incision lateral to the umbilicus above her pant-line and the appropriate distal connectors were placed. We then ran in 500 mL of sterile saline, and over 300 mL ran out immediately.    Laparoscopic confirmation of placement in the pelvis was performed. I then closed the incisions with 3-0 Vicryl and 4-0 Monocryl. A sterile dressing was placed. The patient tolerated the procedure well and was taken to the recovery room in stable condition.     ____________________________ [name],  MD jsd:dm D: 01/18/2013 08:41:34 ET T: 01/18/2013 09:03:41 ET JOB#: [id number]  cc: [name], MD, <Dictator> [name] MD ELECTRONICALLY SIGNED 01/18/2013 15:10

## 2014-08-11 NOTE — Consult Note (Signed)
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 The patient recently had a liter of pleural fluid removed.  Her pleural effusions have become a chronic problem and seem to be related to the peritoneal dialysis and therfore she is transitioning to hemodialysis.  She already has a fistula.  Yesterday in the Emergency Room, chest x-ray showed increasing fluid and recurrent pleural effusion in the right lung. She is being admitted for further evaluation and management. I have been asked to evaluate the peritoneal catheter.  PAST MEDICAL HISTORY: 1.  Gallstones.  2.  Anemia of chronic disease.  3.  End-stage renal disease on peritoneal dialysis.  4.  COPD, end-stage, with 3 liters oxygen.  5.  History of DVT, PE on Coumadin.  6.  Hypertension.  7.  Depression.  8.  Arthritis.  9.  Right shoulder surgery.  10.  Left total knee replacement.  11.  Tonsillectomy.  12.  Tubal ligation.  13.  Left foot surgery.  14.  Recent right-sided pleural effusion status post thoracentesis on September 21st.   Home Medications: Medication Instructions Status  warfarin 2.5 mg oral tablet 1 tab(s) orally once a day (in the morning) Active  lovastatin 20 mg oral tablet 1 tab(s) orally once a day (at bedtime) Active  Vitamin B-12 500 mcg oral tablet 1 tab(s) orally once a day Active  Ventolin HFA CFC free 90 mcg/inh inhalation aerosol 2 puff(s) inhaled 4 times a day, As Needed - for Shortness of Breath Active  benzonatate 100 mg oral capsule 1 cap(s) orally 3 times a day, As Needed Active  Dialyvite 800 Vitamin B Complex with C and Folic Acid oral tablet 1 tab(s) orally once a day (in the morning)  Active  Tylenol Extra Strength PM 500 mg-25 mg oral tablet 2 tab(s) orally once a day (at bedtime), As Needed for sleep Active  citalopram 40 mg oral tablet 1 tab(s) orally once a day (in the morning) Active  pantoprazole 40 mg oral delayed release tablet 1 tab(s) orally once a day Active  Symbicort 80 mcg-4.5 mcg/inh inhalation aerosol 2 puff(s) inhaled 2 times a day, As Needed - for Shortness of Breath Active    [place]: Headaches  Case History:  Family History Non-Contributory   Social History negative tobacco, negative ETOH, negative Illicit drugs   Review of Systems:  Fever/Chills No   Cough No   Sputum No   Abdominal Pain No   Diarrhea No   Constipation No   Nausea/Vomiting No   SOB/DOE Yes   Chest Pain No   Telemetry Reviewed NSR   Dysuria No   Tolerating PT No  SOB   Physical Exam:  GEN well developed, well nourished, no acute distress   HEENT hearing intact to voice, moist oral mucosa   NECK supple  trachea midline   RESP normal resp effort  no use of accessory muscles   CARD regular rate  no JVD   VASCULAR ACCESS AV fistula present  Good bruit  Good thrill   ABD denies tenderness  soft   EXTR negative cyanosis/clubbing, positive edema   SKIN No rashes, No ulcers   NEURO cranial nerves intact, follows commands  PSYCH alert, A+O to time, place, person, good insight   Nursing/Ancillary Notes: **Vital Signs.:   25-Sep-15 17:03  Vital Signs Type Q 8hr  Temperature Temperature (F) 98.4  Celsius 36.8  Temperature Source oral  Pulse Pulse 97  Respirations Respirations 18  Systolic BP Systolic BP 676  Diastolic BP (mmHg) Diastolic BP (mmHg) 74  Mean BP 95  Pulse Ox % Pulse Ox % 95  Pulse Ox Activity Level  At rest  Oxygen Delivery 2L   Hepatic:  24-Sep-15 15:59   Bilirubin, Total 0.2  Bilirubin, Direct < 0.1 (Result(s) reported on 11 Jan 2014 at 04:30PM.)  Alkaline Phosphatase 70 (46-116 NOTE: New Reference Range 11/07/13)  SGPT  (ALT) 15 (14-63 NOTE: New Reference Range 11/07/13)  SGOT (AST) 21  Total Protein, Serum 6.9  Albumin, Serum  3.3  Cardiology:  24-Sep-15 15:47   Ventricular Rate 91  Atrial Rate 91  P-R Interval 164  QRS Duration 78  QT 376  QTc 462  P Axis 54  R Axis 18  T Axis 35  ECG interpretation Normal sinus rhythm Normal ECG When compared with ECG of 20-Nov-2013 15:05, No significant change was found ----------unconfirmed---------- Confirmed by OVERREAD, NOT ([id number]), editor PEARSON, BARBARA ([id number]) on 01/12/2014 9:14:16 AM  Routine Chem:  24-Sep-15 15:59   B-Type Natriuretic Peptide ([facility])  3437 (Result(s) reported on 11 Jan 2014 at 04:30PM.)  Glucose, Serum  106  BUN  42  Creatinine (comp)  6.27  Sodium, Serum 142  Potassium, Serum 3.7  Chloride, Serum  109  CO2, Serum 24  Calcium (Total), Serum 8.5  Anion Gap 9  Osmolality (calc) 294  eGFR (African American)  9  eGFR (Non-African American)  7 (eGFR values <55m/min/1.73 m2 may be an indication of chronic kidney disease (CKD). Calculated eGFR, using the MRDR Study equation, is useful in  patients with stable renal function. The eGFR calculation will not be reliable in acutely ill patients when serum creatinine is changing rapidly. It is not useful in patients on dialysis. The eGFR calculation may not be applicable to patients at the low and high extremes of body sizes, pregnant women, and vetetarians.)  Cardiac:  24-Sep-15 15:59   Troponin I 0.02 (0.00-0.05 0.05 ng/mL or less: NEGATIVE  Repeat testing in 3-6 hrs  if clinically indicated. >0.05 ng/mL: POTENTIAL  MYOCARDIAL INJURY. Repeat  testing in 3-6 hrs if  clinically indicated. NOTE: An increase or decrease  of 30% or more on serial  testing suggests a  clinically important change)  Routine UA:  25-Sep-15 00:15   Color (UA) Yellow  Clarity (UA) Clear  Glucose (UA) 50 mg/dL  Bilirubin (UA) Negative  Ketones (UA) Negative  Specific Gravity (UA) 1.014  Blood  (UA) 1+  pH (UA) 7.0  Protein (UA) 100 mg/dL  Nitrite (UA) Negative  Leukocyte Esterase (UA) Negative (Result(s) reported on 12 Jan 2014 at 12:48AM.)  RBC (UA) 1 /HPF  WBC (UA) <1 /HPF  Bacteria (UA) TRACE  Epithelial Cells (UA) <1 /HPF (Result(s) reported on 12 Jan 2014 at 12:48AM.)  Routine Coag:  24-Sep-15 15:59   Prothrombin 14.1  INR 1.1 (INR reference interval applies to patients on anticoagulant therapy. A single INR therapeutic range for coumarins is not optimal for all indications; however, the suggested range for most indications is 2.0 - 3.0. Exceptions to the INR Reference Range may include: Prosthetic heart valves, acute myocardial infarction, prevention of myocardial infarction, and combinations of aspirin and anticoagulant. The need for a higher or  lower target INR must be assessed individually. Reference: The Pharmacology and Management of the Vitamin K  antagonists: the seventh ACCP Conference on Antithrombotic and Thrombolytic Therapy. IPJAS.5053 Sept:126 (3suppl): [id number]. A HCT value >55% may artifactually increase the PT.  In one study,  the increase was an average of 25%. Reference:  "Effect on Routine and Special Coagulation Testing Values of Citrate Anticoagulant Adjustment in Patients with High HCT Values." American Journal of Clinical Pathology 2006;126:400-405.)  Activated PTT (APTT) 32.3 (A HCT value >55% may artifactually increase the APTT. In one study, the increase was an average of 19%. Reference: "Effect on Routine and Special Coagulation Testing Values of Citrate Anticoagulant Adjustment in Patients with High HCT Values." American Journal of Clinical Pathology 2006;126:400-405.)  Routine Hem:  24-Sep-15 15:59   WBC (CBC) 8.0  RBC (CBC)  2.59  Hemoglobin (CBC)  9.0  Hematocrit (CBC)  27.0  Platelet Count (CBC) 177 (Result(s) reported on 11 Jan 2014 at 04:19PM.)  MCV  105  MCH  34.7  MCHC 33.3  RDW  15.2   [place]:    25-Sep-15 11:15, US  Guide Thoracentesis Right  US Guide Thoracentesis Right   REASON FOR EXAM:    recurrent effusion (NO FLUID LABS NEEDED)  COMMENTS:       PROCEDURE: [place]  - US GUIDED THORACENTESIS RIGHT  - Jan 12 2014 11:15AM     CLINICAL DATA:  Recurrent right pleural effusion    EXAM:  ULTRASOUND GUIDED right THORACENTESIS    PROCEDURE:  An ultrasound guided thoracentesis was thoroughly discussed with the  patient and questions answered. The benefits, risks, alternatives  and complications were also discussed. The patient understands and  wishes to proceed with the procedure. Written consent was obtained.  Ultrasound was performed to localize and mark an adequate pocket of  fluid in the right chest. The area was then prepped and draped in  the normal sterile fashion. 1% Lidocaine was used for local  anesthesia. Under ultrasound guidance a 6 French thoracentesis  catheter was introduced. Thoracentesis was performed. The catheter  was removed and a dressing applied.    Complications: None    FINDINGS:  A total of approximately 1.3 L of clear yellow fluid was removed. A  fluid sample was not sent for laboratory analysis.     IMPRESSION:  Successful ultrasound guided right thoracentesis yielding 1.3 L of  pleural fluid.      Electronically Signed    By: Inez Catalina M.D.    On: 01/12/2014 11:21         Verified By: Everlene Farrier, M.D.,    Impression 1. End-stage renal disease on peritoneal dialysis. Spoke with Dr. Holley Raring, who has orders put in for a PD.  It appears the patient has been noncompliant with her dialysis at home. Family is requesting the patient be switched to hemodialysis.  Given teh current situation this is a resonable request and the patient is already set up for Hemodialysis.   I have discussed this with nephrology and they concur.  I will plan for removal of the PD catheter in the OR later today.  The risks and benefits were reviewed with the patient and family and all agree  to proceed. 2.  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 Continue p.r.n. nebulizers and inhalers. No indication for IV Solu-Medrol.  3.   Acute on chronic respiratory failure secondary to reaccumulation and recurrent right-sided pleural effusion. The patient is going to be admitted  on the medical floor. We will order a paracentesis tomorrow. She had fluid analysis done recently with the pleural effusion that was done on the 21st of September with removal of a liter of fluid, appears to be transudative with cytology negative for malignant cells. Spoke with Dr. Raul Del who will see the patient in consultation. We will continue p.r.n. nebulizers and inhalers.  4.  History of deep venous thrombosis and pulmonary embolism with history of anemia. The patient has been on warfarin; it was recently held. Just started yesterday because of recent thoracentesis. Her INR is 1.1. I will hold off on warfarin, get her thoracentesis done tomorrow, and then resume her Coumadin.  5.  Anemia of chronic disease. No need for transfusion.   Plan level 3 consult   Electronic Signatures: Hortencia Pilar (MD)  (Signed 27-Sep-15 15:35)  Authored: General Aspect/Present Illness, Home Medications, Allergies, History and Physical Exam, Vital Signs, Labs, Radiology, Impression/Plan   Last Updated: 27-Sep-15 15:35 by Hortencia Pilar (MD)

## 2014-08-11 NOTE — Discharge Summary (Signed)
PATIENT NAME:  Ariel Smith, Ariel Smith MR#:  [id number] DATE OF BIRTH:  01/16/1946  DATE OF ADMISSION:  11/20/2013 DATE OF DISCHARGE:  11/21/2013  ADMITTING PHYSICIAN:  Patricia Pesa, MD   DISCHARGING PHYSICIAN:  Enid Baas, MD   PRIMARY CARE PHYSICIAN: [name], MD, from [facility].    PRIMARY NEPHROLOGIST: Laverda Sorenson, MD.  CONSULTATIONS: In the hospital, nephrology consultation by Dr. Cherylann Ratel  DISCHARGE DIAGNOSES:   1.  Dizziness, likely secondary to hypertension, could have been from dehydration.  2.  Diarrhea, likely viral gastroenteritis seems to be resolved.  3.  Hyperkalemia and hypermagnesemia.  4.  End-stage renal disease on peritoneal dialysis.  5.  Anemia of chronic kidney disease.  6.  Iron deficiency and vitamin B12 deficiency.  7.  History of deep vein thrombosis and pulmonary embolism status post inferior venae cavae filter and on Coumadin.  8.  Hypertension.  9.  Hyperlipidemia.  10.  Chronic obstructive pulmonary disease.   DISCHARGE HOME MEDICATIONS:  1.  Lovastatin 20 mg p.o. at bedtime.  2. Tylenol Extra Strength PM 500 mg/25 mg 2 tablets once a day at bedtime as needed for sleep.  3.  Celexa 40 mg p.o. daily.  4.  Dialyvite 800 mg with vitamin B complex and vitamin C and folic acid 1 tablet p.o. daily.  5.  Symbicort  80/4.5 mcg 2 puffs b.i.d.  6.  Renvela 800 mg 3 tablets 3 times a day with meals and 2 tablets with snacks.  7.  Warfarin 2.5 mg p.o. daily.  8.  Ventolin inhaler 2 puffs 4 times a day as needed for shortness of breath.  9.  Tylenol Extra Strength 500 mg capsule 2 tablets every 6 hours as needed for pain.  10.  Calcitriol 0.25 mcg p.o. daily.  11.  Tramadol 50 mg half tablet p.o. b.i.d.  12.  Protonix 40 mg p.o. daily.  13.  Trimetozine 12.5 mg  every 6 hours p.r.n. for nausea and vomiting.   DISCHARGE DIET: Renal diet.   DISCHARGE ACTIVITY: As tolerated.   HOME OXYGEN: None.   FOLLOWUP INSTRUCTIONS: 1.  PCP  follow-up in 1 week.  2.  Nephrology follow-up in 1 week.  3.  If dizziness is worse outpatient ENT followup.   LABORATORY AND IMAGING STUDIES:  Prior to discharge:  1.  WBC 8.6, hemoglobin 8.8, hematocrit 25.9, platelet count 186,000.  2.  Sodium 140, potassium 3.1, chloride 110, bicarb 22, BUN 48, creatinine 8.21, glucose 103, and calcium 10.8.  3.  ALT 19, AST 23,  alkaline phosphatase 75, total  bilirubin is 0.2, and albumin of 2.7, INR is 2.0, magnesium 1.8, which was replaced; HbA1c of 5.4; troponin is negative.  4.  Peritoneal fluid with no indication of any infection.  5.  CT of the abdomen and pelvis showing dialysis catheter in pelvis with mild loculated ascites, which could be secondary to dialysis, small amount of fluid in the pelvis; no abscess; no obstruction; no mesentery thickening; small hiatal hernia; kidneys consistent with medical renal disease, no hydronephrosis. Patchy fibrosis in lung bases, probable hematoma adjacent to the liver laterally; gallbladder is absent.   BRIEF HOSPITAL COURSE: Ariel Smith is a 69 year old Caucasian female with past medical history significant for end-stage renal disease on peritoneal dialysis, history of DVT and PE, has an IVC filter and on Coumadin, anemia of chronic disease, presents from home secondary to sudden onset of dizziness, near syncope and was noted to be hypotensive.  1.  Dizziness. On and off dizziness but yesterday was worse, blood pressure was low. She has been out in the hot sun all day yesterday and was also having viral gastroenteritis with diarrhea for a few days, probably not keeping up with her IV fluids. Her pressure here has been stable. She was monitored, received some fluids. Her orthostatic vitals have remained stable. She was advised to change her dialysate solution  if she is dehydrated with her peritoneal dialysis. Her hypokalemia, hypomagnesemia were addressed while in the hospital. She was able to ambulate with physical  therapy. She did have a CT of her head done for her dizziness which did show just atrophy and chronic small vessel ischemic changes. No abnormalities. So she is being discharged home.  2.  End-stage renal disease on peritoneal dialysis. Follows with Dr. Wynelle Link , was seen by Dr. Cherylann Ratel in the hospital and was told about changing her dialysate solution depending upon her hydration status. She is on calcitriol, Renvela, and vitamin supplements.  3.  History of deep vein thrombosis and pulmonary embolism with anemia.  Has history of deep venous thrombosis and pulmonary embolism on Coumadin, INR is therapeutic. She already has an inferior venae cavae filter.  4.  Anemia of chronic disease, stable. No need for transfusion at this time.  5.  Her course has been otherwise uneventful in the hospital.   DISCHARGE CONDITION: Stable.   DISCHARGE DISPOSITION: Home.   TIME SPENT ON DISCHARGE: Was 40 minutes.    ____________________________ Enid Baas, MD rk:nt D: 11/21/2013 12:57:03 ET T: 11/21/2013 15:25:11 ET JOB#: [id number]  cc: Enid Baas, MD, <Dictator> [name], MD Laverda Sorenson, MD    Enid Baas MD ELECTRONICALLY SIGNED 12/05/2013 14:06

## 2014-08-11 NOTE — Consult Note (Signed)
Brief Consult Note: Diagnosis: complication of dialysis device; end stage renal disease on dialysis; recurrent pleural effusion.   Recommend to proceed with surgery or procedure.   Comments: The patient will require switching back to hemo dialysis.  PD most likely leading to right sided pleural effusion There fore will remove the PD catheter in OR.  Electronic Signatures: Levora Dredge (MD)  (Signed 25-Sep-15 17:36)  Authored: Brief Consult Note   Last Updated: 25-Sep-15 17:36 by Levora Dredge (MD)

## 2014-08-11 NOTE — H&P (Signed)
PATIENT NAME:  Ariel Smith, Ariel Smith MR#:  [id number] DATE OF BIRTH:  1946-03-27  DATE OF ADMISSION:  01/11/2014  PRIMARY CARE PHYSICIAN:  Dr. Sharen Hones.   CHIEF COMPLAINT: Increasing shortness of breath.   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 The patient recently had a liter of fluid removed from her right kidney, which appears to be transudate. In the Emergency Room, chest x-ray showed increasing fluid and recurrent pleural effusion in the right lung. She is being admitted for further evaluation and management.   PAST MEDICAL HISTORY: 1.  Gallstones.  2.  Anemia of chronic disease.  3.  End-stage renal disease on peritoneal dialysis.  4.  COPD, end-stage, with 3 liters oxygen.  5.  History of DVT, PE on Coumadin.  6.  Hypertension.  7.  Depression.  8.  Arthritis.  9.  Right shoulder surgery.  10.  Left total knee replacement.  11.  Tonsillectomy.  12.  Tubal ligation.  13.  Left foot surgery.  14.  Recent right-sided pleural effusion status post thoracentesis on September 21st.    SOCIAL HISTORY: Lives at home, ex-smoker, nonalcoholic.   ALLERGIES: TO MIDRIN.   MEDICATIONS: 1.  Warfarin 2.5 mg p.o. daily in the morning.  2.  Vitamin B12 500 mcg p.o. daily.  3.  Ventolin HFA 2 puffs 4 times a day.  4.  Tylenol Extra Strength PM 2 tablets once a day at bedtime.  5.  Symbicort 80/4.5 two puffs b.i.d.  6.  Pantoprazole 40 mg daily.  7.  Lovastatin 20 mg p.o. daily at bedtime.  8.  Daily-Vite 1 tablet once a day.  9.  Citalopram 40 mg daily.  10.  Benzonatate 100 mg 1 capsule 3 times a day.   FAMILY HISTORY: Positive for heart disease, melanoma, and thyroid cancer.   REVIEW OF SYSTEMS:   CONSTITUTIONAL: Positive for  fatigue, weakness.  EYES: No blurred or double vision, glaucoma, or cataracts.  ENT: No tinnitus, sinusitis, dysphagia RESPIRATORY:  Positive for chronic shortness of breath and dyspnea.  Positive for chronic COPD.  CARDIOVASCULAR: No chest pain, orthopnea, edema. Positive for dyspnea on exertion.  GASTROINTESTINAL: No nausea, vomiting, diarrhea, or GERD.  GENITOURINARY: No dysuria. The patient has renal failure.  Does not make much urine.  ENDOCRINE: No polyuria, nocturia, or thyroid problems.  HEMATOLOGY: The patient has chronic anemia. No easy bruising or bleeding.  SKIN: No acne or rash.  MUSCULOSKELETAL: Positive for arthritis and No swelling or gout.  NEUROLOGIC:  No CVA, TIA, or seizures.  PSYCHIATRIC: No anxiety or depression.   All other systems reviewed and negative.   PHYSICAL EXAMINATION: GENERAL: The patient is awake, alert, oriented x3, not in acute distress.  VITAL SIGNS: Afebrile, pulse is 92, blood pressure is 171/94, saturations 98% on 3 liters.  HEENT: Atraumatic, normocephalic. PERRLA. EOM intact. Oral mucosa is moist.  NECK: Supple. No JVD. No carotid bruit.  LUNGS: Clear to auscultation bilaterally. Distant breath sounds. No audible wheezing. Coarse crackles heard. No respiratory distress or labored breathing.  CARDIOVASCULAR:  Both the heart sounds are normal. Rhythm is regular, rate is tachycardic. No murmur heard. PMI not lateralized. Chest nontender.  EXTREMITIES: Good pedal pulses, good femoral pulses. No lower extremity edema.  ABDOMEN:  Soft, benign there is PD catheter present. Skin around appears normal. No organomegaly. No tenderness noted.  NEUROLOGIC: Grossly intact cranial nerves II through XII. No motor or sensory deficits.  PSYCHIATRIC: The patient is awake, alert, oriented x3.  SKIN: Warm and dry.   IMAGING AND LABORATORY DATA: Chest x-ray shows increasing trouble reaccumulation of significant volume of pleural fluid on the right. Hemoglobin and  hematocrit is 9 and 27.0. White count is 8. Troponin is 0.02. BUN is 42. Creatinine is 6.27. Sodium is 142, potassium is 3.7, chloride is 109. PT/INR is 14.1 and 1.1. EKG shows normal sinus rhythm.   ASSESSMENT: [age]-year-old Ariel Smith with history of chronic obstructive pulmonary disease on home oxygen comes to the Emergency Room with increasing shortness of breath. She is being admitted with:  1.  Acute on chronic respiratory failure secondary to reaccumulation and recurrent right-sided pleural effusion. The patient is going to be admitted on the medical floor. We will order a paracentesis tomorrow. She had fluid analysis done recently with the pleural effusion that was done on the 21st of September with removal of a liter of fluid, appears to be transudative with cytology negative for malignant cells. Spoke with Dr. Meredeth Ide who will see the patient in consultation. We will continue p.r.n. nebulizers and inhalers.  2.  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 Continue p.r.n. nebulizers and inhalers. No indication for IV Solu-Medrol.  3.  End-stage renal disease on peritoneal dialysis. Spoke with Dr. Cherylann Ratel, who has orders put in for a PD.  It appears the patient has been noncompliant with her dialysis at home. Family is requesting the patient be switched to hemodialysis.  We will defer it to nephrology to make that decision.  4.  History of deep venous thrombosis and pulmonary embolism with history of anemia. The patient has been on warfarin; it was recently held. Just started yesterday because of recent thoracentesis. Her INR is 1.1. I will hold off on warfarin, get her thoracentesis done tomorrow, and then resume her Coumadin.  5.  Anemia of chronic disease. No need for transfusion.  6. Deep vein thrombosis prophylaxis: We will give SCDs and TEDs.   7.  The patient is a full code.   Above was discussed with the patient and the patient's daughter and  husband were present in the Emergency Room.   TIME SPENT: Fifty-five minutes.     ____________________________ Ariel Hail Allena Katz, MD sap:lr D: 01/11/2014 18:38:52 ET T: 01/11/2014 19:08:28 ET JOB#: [id number]  cc: Josimar Corning A. Allena Katz, MD, <Dictator> Laverda Sorenson, MD Herbon E. Meredeth Ide, MD Willow Ora MD ELECTRONICALLY SIGNED 01/25/2014 16:58

## 2014-08-11 NOTE — H&P (Signed)
PATIENT NAME:  Ariel Smith, Ariel Smith MR#:  [id number] DATE OF BIRTH:  Jun 23, 1945  DATE OF ADMISSION:  11/20/2013  PRIMARY CARE PHYSICIAN: [name], MD, [facility].   REQUESTING PHYSICIAN: Dorothea Glassman, MD  ONCOLOGY: Sandeep R. Sherrlyn Hock, MD  CHIEF COMPLAINT: Dizziness.   HISTORY OF PRESENT ILLNESS: The patient is a 69 year old female with a known history of end-stage renal disease getting peritoneal dialysis, anemia of chronic kidney disease, and history of DVTs status post PE and lifelong Coumadin, status post IVC filter, who is being admitted for dizziness. The patient has been having on-and-off diarrhea for the last 2-[redacted] weeks along with abdominal pain. She feels that she does not have any energy and she could hardly walk. As per her daughter, who is dictating a lot of her care right now while I was in the ER room (she would not let patient talk much), the patient has been weak like water and does not have any energy. She reported the patient's blood pressure to be 77/69 at home and she was almost presyncopal at home. In the ED, her pressure was 112/88. The patient wanted to go home. Her daughter and her husband were very upset and did not want her to leave from the hospital until they get some answer. At this point, the patient is being admitted for further evaluation and management considering her hypotension and dizziness. A CT scan of the abdomen in the ED did not show any obvious acute pathology.  The patient denies any other symptoms at this time.   PAST MEDICAL HISTORY: 1.  Anemia of chronic kidney disease. 2.  Iron deficiency.  3.  Vitamin B12 deficiency. 4.  History of DVT status post IVC filter. She also had PE in 2009. Now she is on lifelong Coumadin therapy.  5.  End-stage renal disease getting peritoneal dialysis.  6.  Hypertension. 7.  Hyperlipidemia.  8.  COPD.   PAST SURGICAL HISTORY:  1.  Right shoulder surgery.  2.  Left knee replacement.   FAMILY HISTORY:  Positive for heart disease, melanoma, and thyroid cancer.   SOCIAL HISTORY: 1.  She is a former smoker, quit in 2008.  2.  No alcohol use. Minimally active physically.   ALLERGIES: MIDRIN.    HOME MEDICATIONS: 1.  Calcitriol 0.25 mcg once daily. 2.  Citalopram 40 mg p.o. daily.  3.  Dialyvite once daily.  4.  Lovastatin 20 mg p.o. at bedtime.  5.  Protonix 40 mg p.o. daily.  6.  Promethazine 12.5 mg p.o. every 6 hours as needed.  7.  Renvela 800 mg 2 tablets p.o. 3 times a day and 2 tablets with snacks.  8.  Symbicort 2 puffs inhaled twice a day as needed.  9.  Tramadol 50 mg 1/2 tablet p.o. b.i.d.  10.  Tylenol 1000 mg p.o. every 6 hours as needed.  11.  Tylenol 500/25 mg 2 tablets p.o. at bedtime as needed. 12.  Ventolin HFA 2 puffs inhaled 4 times a day as needed.  13.  Warfarin 2.5 mg p.o. daily.   REVIEW OF SYSTEMS:  CONSTITUTIONAL: No fever. Positive fatigue and weakness.  EYES: No blurred or double vision.  ENT: No tinnitus or ear pain.  RESPIRATORY: Positive for cough, dry in nature. No wheezing or hemoptysis.  CARDIOVASCULAR: No chest pain, orthopnea, edema.  GASTROINTESTINAL: Positive for diarrhea and stomach pain. No nausea or vomiting. At times when she is constantly vomiting she might throw up, as per her daughter.  GENITOURINARY:  No dysuria or hematuria.  ENDOCRINE: No polyuria or nocturia.  HEMATOLOGY: Anemia of chronic kidney disease and iron deficiency anemia along with vitamin B12.  SKIN: No rash or lesion.  MUSCULOSKELETAL: No arthritis or muscle cramp. She has just been feeling really weak.  NEUROLOGIC: Positive for dizziness and presyncope.  PSYCHIATRY: No history of anxiety or depression.   PHYSICAL EXAMINATION:  VITAL SIGNS: Temperature 98.2, heart rate 82 per minute, respirations 18 per minute, blood pressure 112/88 mmHg. She is saturating 96% on room air. GENERAL: Patient is a 69 year old female lying in the bed comfortably without any acute distress.   EYES: Pupils equal, round, reactive to light and accommodation. No scleral icterus. Extraocular muscles intact.  HEENT: Head atraumatic, normocephalic. Oropharynx and nasopharynx clear.  NECK: Supple. No jugular venous distention. No thyroid enlargement or tenderness.  LUNGS: Clear to auscultation bilaterally. No wheezing, rales, rhonchi, or crepitation.  CARDIOVASCULAR: S1, S2 normal. No murmurs, rubs, or gallop.  ABDOMEN: Soft, nontender, nondistended. Bowel sounds present. No organomegaly or mass.  EXTREMITIES: No pedal edema, cyanosis, or clubbing.  NEUROLOGIC: Cranial nerves II through XII intact. Muscle strength 4/5 in all extremities. Sensation intact.  PSYCHIATRIC: The patient is alert and oriented x 3.  SKIN: No obvious rash, lesion, or ulcer.   LABORATORY PANEL: Normal BMP, except BUN of 46, creatinine 8.21. Normal liver function tests. Normal first set of troponins. Normal CBC except hemoglobin of 9.1, hematocrit 27.4. PT of 21.8, INR 2.0. Her TSH was 5.39.   IMAGING: 1.  Chest x-ray in the ED showed no acute cardiopulmonary disease. 2.  CT scan of the abdomen and pelvis showed probable hematoma adjacent to liver which is stable. Patchy fibrosis at the lung bases. Small hiatal hernia. No abscess or bowel obstruction. Mild loculated ascitic fluid close to dialysis catheter. A small amount of infected fluid in the pelvis cannot be excluded.   EKG shows normal sinus rhythm. No ST-T changes.   IMPRESSION AND PLAN:  1.  Dizziness, likely due to hypotension. Will check orthostatic vitals. Get physical therapy, occupational therapy consultation. Monitor her blood pressure. Will go ahead and get 2 sets of blood cultures, considering her hypotension and having a peritoneal dialysis catheter in place. 2.  History of deep vein thrombosis and pulmonary embolus in 2009 with lifelong Coumadin status post inferior vena cava filter. We will continue Coumadin, continue to check daily coagulation  panels. Her INR was therapeutic.  3.  Weakness, likely multifactorial. We will get physical therapy, occupational therapy consultation for further evaluation. May need a placement. 4.  Diarrhea, likely viral in nature. We will check stool studies at this time.  TIME SPENT: Total time taking care of this patient was 45 minutes.  CODE STATUS: Full code.   ____________________________ Ellamae Sia. Sherryll Burger, MD vss:sk D: 11/20/2013 20:05:52 ET T: 11/20/2013 21:39:03 ET JOB#: [id number]  cc: Xzavien Harada S. Sherryll Burger, MD, <Dictator> [name], MD Ellamae Sia [facility] MD ELECTRONICALLY SIGNED 11/21/2013 17:37

## 2014-08-11 NOTE — Op Note (Signed)
PATIENT NAME:  Ariel Smith, Ariel Smith MR#:  [id number] DATE OF BIRTH:  28-Feb-1946  DATE OF PROCEDURE:  01/12/2014  PREOPERATIVE DIAGNOSES:  1. Complication of dialysis device.  2. End-stage renal disease requiring dialysis.  3. Recurrent pleural effusion causing respiratory compromise.   PROCEDURE PERFORMED: Removal of peritoneal dialysis catheter.   SURGEON: Renford Dills, M.D.   ANESTHESIA: MAC.   FLUIDS: Per anesthesia record.   ESTIMATED BLOOD LOSS: Minimal.   SPECIMEN: Removed the catheter, photographed for the permanent record.   INDICATIONS: Ms. Trimarchi is a 69 year old woman who has been unable to continue her dialysis secondary to recurrent pleural effusions causing profound respiratory compromise. She has been initiated on hemodialysis and therefore requires removal of her peritoneal catheter. Risks and benefits were reviewed. All questions answered. The patient agrees to proceed.   DESCRIPTION OF PROCEDURE: The patient is taken to the operating room and placed in the supine position. After adequate sedation is achieved, the abdominal wall and catheter are prepped and draped in a sterile fashion.   Marcaine 0.25% with epinephrine is then infiltrated in the soft tissues in the periumbilical area where the catheter has been placed and once this is achieved linear incision is created and carried down to expose the catheter cuff is identified and the dissection is completed circumferentially.   The second catheter or cuff is located at the exit site. Marcaine 0.25% is then infiltrated and a small incision is made at the exit site of the catheter with gentle traction. The catheter cuff is then freed from the surrounding tissues. The catheter is then transected and both pieces are removed and photographed on the side table. Pursestring suture of 0 Vicryl is then placed around the site in the rectus sheath that perforated into the peritoneal cavity. The wound is then washed and irrigated  with sterile saline. It is then closed in multiple layers using 3-0 Vicryl, 4-0 Monocryl subcuticular followed by Dermabond. The exit site is then treated with bacitracin ointment and a sterile dressing. The patient tolerated the procedure well and there were no immediate complications.    ____________________________ Renford Dills, MD ggs:JT D: 01/12/2014 20:22:46 ET T: 01/13/2014 03:36:15 ET JOB#: [id number]  cc: Renford Dills, MD, <Dictator> Munsoor Lizabeth Leyden, MD  Renford Dills MD ELECTRONICALLY SIGNED 02/05/2014 20:45

## 2014-08-11 NOTE — Discharge Summary (Signed)
PATIENT NAME:  Ariel Smith, WASSEL MR#:  [id number] DATE OF BIRTH:  09/28/1945  DATE OF ADMISSION:  01/11/2014 DATE OF DISCHARGE:  01/15/2014  DISCHARGE DIAGNOSES: 1.  Recurrent right pleural effusion due to peritoneal fluid translocation, status post repeat thoracentesis with 1.3 liters of fluid removal with symptomatic improvement.  2.  End-stage renal disease, switched over to hemodialysis instead of peritoneal dialysis and working well.   SECONDARY DIAGNOSES:  1.  Anemia of chronic disease.  2.  End stage renal disease.  3.  Chronic obstructive pulmonary disease, on 3 liters oxygen.  4.  History of deep venous thrombosis and pulmonary embolus on Coumadin.  5.  Hypertension.  6.  Depression.  7.  Arthritis.  8.  Gallstone.   CONSULTATIONS:  1.  Nephrology, Lennox Pippins, MD.  2.  Vascular surgery, Levora Dredge, MD.  3.  Pulmonary, Ned Clines, MD.  PROCEDURES AND RADIOLOGY: Removal of peritoneal dialysis catheter on the 25th of September by Dr. Gilda Crease.   Chest x-ray on the 24th of September showed re-accumulation of significant volume of fluid on the right.   Chest x-ray status post thoracentesis on the 25th of September showed no evidence of pneumothorax following right thoracentesis.   Right ultrasound-guided thoracentesis on the 25th of September showed successful ultrasound-guided right-sided thoracentesis yielding 1.3 liters of pleural fluid.   A 2D echocardiogram on the 26th of September showed LVEF of 55% to 60%. Mild to moderate mitral valve regurgitation. Mild to moderate aortic valve stenosis. Mild tricuspid regurgitation.   Urinalysis on the 25th of September was negative.   Intact PTH was elevated with a value of 215.   HISTORY AND SHORT HOSPITAL COURSE: The patient is a 69 year old female with the above-mentioned medical problems who was admitted for increasing shortness of breath, thought to be secondary to re-accumulation of right-sided pleural effusion.  Please see Dr. Jearl Klinefelter Patel's dictated history and physical for further details. The patient underwent ultrasound-guided thoracentesis on the right with removal of 1.3 liters of fluid with significant symptomatic improvement. Nephrology consultation was obtained with Dr. Heide Spark who recommended switching from peritoneal dialysis to hemodialysis as this was thought to be contributing to her recurrent re-accumulating right-sided pleural effusion. The patient was in agreement for same and her peritoneal dialysis catheter was removed in the operating room by Dr. Gilda Crease on the 25th of September. She was started on hemodialysis by nephrology. Pulmonary consultation was obtained with Dr. Meredeth Ide who agreed with above management. The patient was slowly improving, was feeling much better and was close to her baseline on 28th of September and was discharged home in stable condition.   PHYSICAL EXAMINATION:  VITAL SIGNS: On the date of discharge, her vital signs were as follows: Temperature 98.4, heart rate 87 per minute, respirations 18 per minute, blood pressure 140/76. She was saturating 93% on 2 liters oxygen via nasal cannula, which is her baseline.  PERTINENT PHYSICAL EXAMINATION ON THE DATE OF DISCHARGE: CARDIOVASCULAR: S1, S2 normal. No murmurs, rubs or gallops.  LUNGS: Clear to auscultation bilaterally. No wheezing, rales, rhonchi, crepitation.  ABDOMEN: Soft, benign.  NEUROLOGIC: Nonfocal examination. All other physical examination remained at baseline.   DISCHARGE MEDICATIONS:  Medication Instructions  lovastatin 20 mg oral tablet  1 tab(s) orally once a day (at bedtime)   tylenol extra strength pm 500 mg-25 mg oral tablet  2 tab(s) orally once a day (at bedtime), As Needed for sleep   citalopram 40 mg oral tablet  1 tab(s) orally once  a day (in the morning)   dialyvite 800 vitamin b complex with c and folic acid oral tablet  1 tab(s) orally once a day (in the morning)   symbicort 80 mcg-4.5  mcg/inh inhalation aerosol  2 puff(s) inhaled 2 times a day, As Needed - for Shortness of Breath   warfarin 2.5 mg oral tablet  1 tab(s) orally once a day (in the morning)   ventolin hfa cfc free 90 mcg/inh inhalation aerosol  2 puff(s) inhaled 4 times a day, As Needed - for Shortness of Breath   pantoprazole 40 mg oral delayed release tablet  1 tab(s) orally once a day   benzonatate 100 mg oral capsule  1 cap(s) orally 3 times a day, As Needed   vitamin b-12 500 mcg oral tablet  1 tab(s) orally once a day     DISCHARGE DIET: Dialysis diet.   DISCHARGE ACTIVITY: As tolerated.   DISCHARGE INSTRUCTIONS AND FOLLOWUP: The patient was instructed to followup with her primary care physician, Dr. [name]  in 1-2 weeks. She will need followup with Dr. Mosetta Pigeon from nephrology in 2-4 weeks. She will get hemodialysis as scheduled as an outpatient. The patient was instructed to get PT/INR checked on 30th of September and 1st of October with results forwarded to primary care physician for Coumadin dose adjustment.   TOTAL TIME DISCHARGING THIS PATIENT: Fifty-five minutes.   Please note, the patient remains at very high risk for readmission.    ____________________________ Ellamae Sia. Sherryll Burger, MD vss:TT D: 01/15/2014 22:44:40 ET T: 01/15/2014 22:57:35 ET JOB#: [id number]  cc: Archer Moist S. Sherryll Burger, MD, <Dictator> [name], MD Munsoor Lizabeth Leyden, MD Mosetta Pigeon, MD Renford Dills, MD Herbon E. Meredeth Ide, MD Ellamae Sia [facility] MD ELECTRONICALLY SIGNED 01/23/2014 15:47

## 2014-08-12 NOTE — Discharge Summary (Signed)
PATIENT NAME:  Ariel Smith, Ariel Smith MR#:  [id number] DATE OF BIRTH:  [birth year]  DATE OF ADMISSION:  09/15/2011 DATE OF DISCHARGE:  09/26/2011  DISCHARGE DIAGNOSES:  1. Chronic cholecystitis and cholelithiasis.  2. Left lower extremity deep vein thrombosis.  3. Chronic obstructive pulmonary disease.  4. Chronic renal failure.  5. Factor II deficiency requiring ongoing anticoagulation.  6. Obesity.  7. Essential hypertension.  8. CT suggesting colitis of the left colon.  9. CT suggesting gastric mass.   CLINICAL NOTE: This 69 year old woman has had chronic abdominal pain and ultrasound showing evidence of cholelithiasis. She has developed renal failure, but had not yet been started on dialysis. She has Factor II deficiency making it necessary for her to remain on anticoagulation to prevent a recurrent pulmonary embolus (original episode 2010). Because of her chronic abdominal pain and identification of gallstones, she was felt to be a candidate for cholecystectomy. The patient had a lower extremity ultrasound to evaluate for active DVT on admission to the hospital and was found to have a new DVT. For that reason, a vena cava filter was placed by Dr. Festus Barren prior to the surgery. She had presented with a change in bowel habits including lower abdominal pain and diarrhea. CT scan suggested pancolitis involving the left colon as well as a gastric mass. Plans were for upper and lower endoscopy at the time of admission. She was admitted for crossover therapy to IV heparin as she was not a candidate for Lovenox three days prior to her planned cholecystectomy. She did require three units of fresh frozen plasma to get her pro-time within target value for surgical intervention. She was taken to the operating room on May 31st at which time she underwent a laparoscopic cholecystectomy with intraoperative cholangiograms as well as upper and lower endoscopy. During her hospitalization, her fistula was found to be  inadequate for dialysis and a PermCath was placed by [name]. During the same hospitalization the previously created left upper extremity AV fistula was moved to a more superficial location.   The patient's surgical experience was unremarkable. She had prolonged hospital stay while her pro-time was brought back to adequate levels with oral Coumadin. During the hospital stay biopsies of the GE junction showed acute and chronic inflammation with gastric-type and scant squamous mucosa. Biopsies of the transverse, descending, and sigmoid colon showed benign mucosa. No evidence of pancolitis.   The patient was seen in consultation by both Vascular Surgery and the Nephrology service during her hospital stay.   At the time of discharge, her pro-time was not quite to 2 but in discussion with the vascular service was felt to be adequate for discharge. Outpatient monitoring with [facility] has been arranged.   SUMMARY OF LABORATORY STUDIES: Hemoglobin was 9 falling to an 8-hour of 8.2, at discharge 9.1. Platelet count ranged between 145,000 and 182,000. Creatinine was 3.8, 4.75 on admission, 3.64 after initiation of dialysis. Electrolytes were unremarkable after dialysis with correction of her low serum bicarbonate level. During the patient's hospitalization, Hepatitis B screening was completed and this was negative. Parathyroid level was elevated at 282, consistent with her known renal disease.   DISCHARGE INSTRUCTIONS:  1. Resumption of all of her prehospital medications.  2. Arrangements have been made for outpatient follow-up in my office.  3. The patient will continue follow-up with the renal service as previously scheduled.   ____________________________ Earline Mayotte, MD jwb:drc D: 10/08/2011 09:32:55 ET T: 10/08/2011 11:21:15 ET JOB#: [id number]  cc: Earline Mayotte, MD, <Dictator> Leo Grosser, MD [name], MD Mun[place] Lizabeth Leyden, MD Glendene Wyer Brion Aliment  MD ELECTRONICALLY SIGNED 10/22/2011 19:38

## 2014-08-12 NOTE — Op Note (Signed)
PATIENT NAME:  Ariel Smith, Ariel Smith MR#:  [id number] DATE OF BIRTH:  1945/12/07  DATE OF PROCEDURE:  07/09/2011  PREOPERATIVE DIAGNOSES:  1. Chronic kidney disease nearing dialysis dependence.  2. Factor V Leiden.  3. Chronic obstructive pulmonary disease.  4. Hypertension.   POSTOPERATIVE DIAGNOSES:  1. Chronic kidney disease nearing dialysis dependence.  2. Factor V Leiden.  3. Chronic obstructive pulmonary disease.  4. Hypertension.   PROCEDURE PERFORMED: Left radiocephalic AV fistula creation.   SURGEON: [name], MD  ANESTHESIA: General.  ESTIMATED BLOOD LOSS: Minimal.   INDICATION FOR PROCEDURE: This is a 69 year old white female with chronic kidney disease nearing dialysis dependence. She has adequate cephalic vein in the left upper extremity for fistula creation and radiocephalic AV fistula is planned. The risks and benefits were discussed and informed consent was obtained.   DESCRIPTION OF PROCEDURE: The patient was brought to the operative suite and after an adequate level of general endotracheal anesthesia had been obtained the left upper extremity was sterilely prepped and draped and a sterile surgical field was created. An incision was created between the cephalic vein and the palpable radial pulse. We dissected out and marked the cephalic vein for orientation ligating one venous branch between silk ties and then dissected out the radial artery, which was an adequate vessel for fistula creation as well. This was encircled with vessel loops proximally and distally and one arterial branch was ligated between silk ties. The patient was given 3000 units of intravenous heparin for systemic anticoagulation. The vein was ligated distally and then cut and beveled to an appropriate length to match the anterior arteriotomy, which was created after pulling up control with vessel loops and opening the artery with an 11 blade and Potts scissors. An anastomosis was created with a running 6-0  Prolene suture in the usual fashion. The vessel was flushed and deaired prior to release of control. On release of control there was some mild vasospasm that was treated with topical papaverine. At this point, there was good flow in the AV fistula with a nice soft thrill within the fistula, and I elected to terminate the procedure. Surgicel was placed. The wound was closed with a running 3-0 Vicryl and 4-0 Monocryl. Dermabond was placed as a dressing. The patient tolerated the procedure well and was taken to the Recovery Room in stable condition.  ____________________________ [name], MD jsd:slb D: 07/09/2011 15:16:10 ET T: 07/09/2011 16:45:25 ET JOB#: [id number]  cc: [name], MD, <Dictator> Leo Grosser, MD Mosetta Pigeon, MD [place] Lizabeth Leyden, MD [name] MD ELECTRONICALLY SIGNED 07/10/2011 17:18

## 2014-08-12 NOTE — Consult Note (Signed)
PATIENT NAME:  Ariel Smith, Ariel Smith MR#:  [id number] DATE OF BIRTH:  [birth year]  DATE OF CONSULTATION:  09/16/2011  REFERRING PHYSICIAN:  Hervey Ard, MD CONSULTING PHYSICIAN:  Hesper Venturella R. Ma Hillock, MD  REASON FOR CONSULTATION: Need correction of coagulopathy for procedures, has elevated INR on Coumadin and history of pulmonary embolus and deep vein thrombosis.   HISTORY OF PRESENT ILLNESS: The patient is a 69 year old female with history of multiple medical problems including anemia (multifactorial) secondary to anemia of chronic kidney disease, iron deficiency, B12 deficiency (on B12 injections), history of deep vein thrombosis and pulmonary embolus in 2009 on lifelong Coumadin status post IVC filter, chronic kidney disease stage V, hypertension, hyperlipidemia, chronic obstructive pulmonary disease, right shoulder surgery, and left knee replacement who was recently seen in the [facility] for low hemoglobin and found to have B12 deficiency and was started on B12 supplementation. The patient's hemoglobin baseline is around 7.5 to 8. She has now been admitted with abdominal pain and is being planned for cholecystectomy, also needs endoscopy and colonoscopy. Clinically she denies any active bleeding symptoms at this time. CBC from today shows hemoglobin 7.3, platelets 208, and WBC 6400 with unremarkable differential.   PAST MEDICAL/SURGICAL HISTORY: As in history of present illness above.   FAMILY HISTORY: Remarkable for heart disease, melanoma, thyroid cancer, prostate cancer, and colon cancer.   SOCIAL HISTORY: Ex-smoker quit in 2008. Denies alcohol usage. Minimally active physically.   ALLERGIES: Midrin.  HOME MEDICATIONS:  1. Lovastatin 20 mg at bedtime.  2. Tylenol 650 mg every 4 hours p.r.n.  3. Sodium bicarbonate 650 mg at bedtime.  4. Symbicort 2 puffs inhaled twice a day. 5. Citalopram 20 mg daily.  6. Ferrous fumarate 324 mg twice a day. 7. Warfarin based on INR. 8. Calcitrol 0.25  mcg capsule daily.   REVIEW OF SYSTEMS: CONSTITUTIONAL: As in history of present illness. No fever or chills. HEENT: No headaches or dizziness at rest. Occasional epistaxis. No ear or jaw pain. CARDIAC: No palpitations, chest pain, orthopnea, or paroxysmal nocturnal dyspnea at rest. RESPIRATORY: Has chronic cough and dyspnea on exertion. No hemoptysis. GI: Intermittent nausea and vomiting. Abdominal pain. Episodic diarrhea and constipation. No blood in stools. GENITOURINARY: No dysuria or hematuria. SKIN: No new rashes, has easy minor bruising. MUSCULOSKELETAL: Chronic arthritis, but no new bone pain. EXTREMITIES: Chronic leg swelling, but no new pain. NEUROLOGIC: No new focal weakness or numbness in extremities.  ENDOCRINE: No polyuria or polydipsia. Appetite steady.   PHYSICAL EXAMINATION:   GENERAL: The patient is tired looking, resting in bed. Otherwise, alert and oriented and in no acute distress or tachypneic.   VITAL SIGNS: 97.7, 82, 22, 150/83, 96% on room air.   HEENT: Normocephalic, atraumatic. Extraocular movements are intact. Sclera anicteric. No oral thrush.   NECK: Supple, no lymphadenopathy.   HEART: S1 and S2, regular rate and rhythm.  LUNGS: Bilateral good air entry, decreased at bases, no rhonchi or crepitations.   ABDOMEN: Soft, mild tenderness in right upper quadrant. Otherwise nondistended and bowel sounds normally heard.   EXTREMITIES: Trace edema. No cyanosis.   NEUROLOGIC: Cranial nerves are intact. Moves all extremities spontaneously.   LABS/STUDIES: PTT greater than 160 on heparin drip. Hemoglobin 7.2, platelets 208, WBC 6400, and unremarkable differential. Creatinine 4.75, BUN 54, and calcium 7.8.   IMPRESSION AND RECOMMENDATIONS: A 69 year old female patient admitted with abdominal pain, planned for laparoscopic cholecystectomy, also being planned for EGD and colonoscopy tomorrow if feasible. The patient is on Coumadin, recent INR  has been elevated. We will get  repeat PT/INR today and corrected it since he is being planned for invasive procedures, will likely need FFP transfusion based on INR level. Also hemoglobin is extremely low at 7.3 grams, per discussion with nephrology she can receive 1 unit of packed red blood cell transfusion today, which we will order. She is being planned for more transfusion once she starts hemodialysis tomorrow. Otherwise, the patient is on IV heparin for bridging therapy given history of deep vein thrombosis and PE in 2009. No obvious or major bleeding symptoms at this time. The patient has recently been started on B12 parenteral therapy and we will continue on this once acute issues resolve. Also, if anemia does not correct after adequate B12 therapy and iron treatment, may need to consider bone marrow biopsy evaluation to evaluate for underlying myelodysplasia or other marrow disorder. The patient is explained above, agreeable to this plan. We will continue to follow. Please feel free to contact me if additional questions.  ____________________________ Rhett Bannister Ma Hillock, MD srp:slb D: 09/17/2011 12:08:00 ET T: 09/17/2011 12:30:22 ET JOB#: [id number]  cc: Xylina Rhoads R. Ma Hillock, MD, <Dictator> Alveta Heimlich MD ELECTRONICALLY SIGNED 09/19/2011 23:27

## 2014-08-12 NOTE — Op Note (Signed)
PATIENT NAME:  Ariel Smith, Ariel Smith MR#:  [id number] DATE OF BIRTH:  [birth year]  DATE OF PROCEDURE:  09/16/2011  PREOPERATIVE DIAGNOSES:  1. New deep venous thrombosis on anticoagulation.  2. Need for cessation of anticoagulation for procedures.  3. Hypercoagulable condition.   POSTOPERATIVE DIAGNOSES:  1. New deep venous thrombosis on anticoagulation.  2. Need for cessation of anticoagulation for procedures.  3. Hypercoagulable condition.   PROCEDURES PERFORMED: 1. Ultrasound guidance for vascular access, right femoral vein.  2. Catheter placement into inferior vena cava.  3. Inferior venacavogram.  4. Placement of a Bard Meridian IVC filter.   SURGEON: [name], M.D.   ANESTHESIA: Local with moderate conscious sedation.   ESTIMATED BLOOD LOSS: Minimal.   FLUOROSCOPY TIME: Less than one minute.   CONTRAST USED: 15 mL.   INDICATION FOR PROCEDURE: This is a female who was admitted to the hospital with biliary disease. She also was going to have an upper and lower endoscopy and needs to be off her anticoagulation. She has known hypercoagulable state and was found to have a new deep vein thrombosis while on anticoagulation. For this reason and for periprocedural protection while off anticoagulation IVC filter will be placed. This will likely be retrieved in the future at our and the patient's discretion once she has completed all of her procedures.   DESCRIPTION OF PROCEDURE: The patient is brought to the vascular interventional radiology suite. Groins were shaved and prepped and a sterile surgical field was created. The right femoral vein was visualized with ultrasound and found to widely patent. It was then accessed under direct ultrasound guidance without difficulty with a Seldinger needle. A J-wire was placed. After skin nick and dilatation, the sheath was placed into the inferior vena cava and inferior venacavogram was performed. This demonstrated a patent vena cava at the level of  renal veins at approximately L1. The Bard Meridian IVC filter was then deployed with the tip at the top of L2. The patient tolerated the procedure well. The delivery sheath was removed, pressure was held, and a sterile dressing was placed. ____________________________ [name], MD jsd:slb D: 09/16/2011 10:02:23 ET T: 09/16/2011 12:57:41 ET JOB#: [id number]  cc: [name], MD, <Dictator> [name] MD ELECTRONICALLY SIGNED 09/21/2011 15:42

## 2014-08-12 NOTE — Consult Note (Signed)
HEMATOLOGY followup note - had first dialysis today, states overall feeling better. No bleeding Sxs.no fevers. INR today 1.9 but still yet to receive second FFP.A, O x 3, NAD. Pallor +          vitals - afebrile, stable          lungs - b/l good air entry          abd - soft, NT- platelets 174, Hb 8, INR 55.72.  69 year old female patient admitted with abdominal pain, planned for laparoscopic cholecystectomy, also being planned for EGD and colonoscopy tomorrow if feasible. INR still 1.9 but still has not received second unit of FFP, will repeat INR later today. Also received 1 unit PRBC yesterday and was planned for PRBC with dialysis Rx. Will also repeat Hb and transfuse if it is 8 or less since she is planned for surgery tomorrow. Otherwise, the patient is on IV heparin for bridging therapy given history of deep vein thrombosis and PE in 2009. No obvious or major bleeding symptoms at this time. Will give next dose of B12 100 mcg SQ today. The patient is explained above, agreeable to this plan. Will continue to follow.   Electronic Signatures: Izola Price (MD)  (Signed on 30-May-13 20:10)  Authored  Last Updated: 30-May-13 20:10 by Izola Price (MD)

## 2014-08-12 NOTE — Consult Note (Signed)
General Aspect History of DVT associated with Hypercoagulable state    Present Illness The patient is a 69 yo woman that has a history of DVT who is now admitted for edoscopy followed by Cholecystectomy.  She is documented hypercoagulable.  She has stopped her Coumadin and is now on a heparin gtt.  Yesterday duplex of the venous system revealed previously undocumented thrombus and therefore it is recommended that she have an IVC filter placed.  At this time she denies SOB or pleuritic chest pain.  No recent changes in the edema of her legs.   Home Medications: Medication Instructions Status  lovastatin 20 mg oral tablet 1 tab(s) orally once a day (at bedtime) Active  acetaminophen 325 mg oral tablet 2 tab(s) orally every 4 hours, As Needed Active  Tylenol Extra Strength PM 500 mg-25 mg oral tablet 2 tab(s) orally once a day (at bedtime) Active  sodium bicarbonate 650 mg oral tablet 1 tab(s) orally once a day (at bedtime) Active  Symbicort 160 mcg-4.5 mcg/inh inhalation aerosol 2 puff(s) inhaled 2 times a day Active  citalopram 20 mg oral tablet 1 tab(s) orally once a day (in the morning) Active  ferrous fumarate 324 mg (106 mg elemental iron) oral tablet 1 tab(s) orally 2 times a day Active  warfarin 2.5 mg oral tablet 1 tab(s) orally 3 times a week on Mon. Wed. Fri. in AM Active  warfarin 2.5 mg oral tablet 0.5 tab(s) orally once a day on Tues. Thurs. Sat. Sun. in AM Active  calcitriol 0.25 mcg oral capsule 1 cap(s) orally once a day Active    Midrin: Headaches  Case History:   Family History Non-Contributory    Social History negative tobacco, negative ETOH, negative Illicit drugs   Review of Systems:   Fever/Chills No    Cough No    Sputum No    Abdominal Pain Yes    Diarrhea No    Constipation Yes    Nausea/Vomiting No    SOB/DOE No    Chest Pain No    Telemetry Reviewed NSR    Dysuria No    Tolerating Diet Yes   Physical Exam:   GEN well developed, well  nourished, no acute distress, obese    HEENT pink conjunctivae, PERRL, hearing intact to voice    NECK supple  trachea midline    RESP normal resp effort  no use of accessory muscles    CARD regular rate  no JVD    VASCULAR ACCESS AV fistula present  Good bruit  Good thrill    ABD soft  nondistended    EXTR negative cyanosis/clubbing, positive edema    SKIN normal to palpation, No rashes    NEURO cranial nerves intact, follows commands, motor/sensory function intact    PSYCH alert, A+O to time, place, person, good insight   Nursing/Ancillary Notes: **Vital Signs.:   29-May-13 05:41   Vital Signs Type Routine   Temperature Temperature (F) 98.1   Celsius 36.7   Temperature Source oral   Pulse Pulse 91   Pulse source per Dinamap   Respirations Respirations 20   Systolic BP Systolic BP 233   Diastolic BP (mmHg) Diastolic BP (mmHg) 99   Mean BP 111   BP Source vital sign device   Pulse Ox % Pulse Ox % 94   Pulse Ox Activity Level  At rest   Oxygen Delivery Room Air/ 21 %   Routine Coag:  28-May-13 14:59    Activated PTT (  APTT) 50.1  Routine Chem:  28-May-13 14:59    Glucose, Serum 85   BUN 54   Creatinine (comp) 4.75   Sodium, Serum 144   Potassium, Serum 4.0   Chloride, Serum 113   CO2, Serum 19   Calcium (Total), Serum 7.8   Anion Gap 12   Osmolality (calc) 301   eGFR (African American) 10   eGFR (Non-African American) 9  Routine Hem:  28-May-13 17:51    WBC (CBC) 6.4   RBC (CBC) 2.29   Hemoglobin (CBC) 7.3   Hematocrit (CBC) 21.8   Platelet Count (CBC) 208   MCV 95   MCH 31.7   MCHC 33.3   RDW 14.2   Neutrophil % 68.8   Lymphocyte % 21.6   Monocyte % 7.5   Eosinophil % 1.6   Basophil % 0.5   Neutrophil # 4.4   Lymphocyte # 1.4   Monocyte # 0.5   Eosinophil # 0.1   Basophil # 0.0  Routine Coag:  29-May-13 01:23    Activated PTT (APTT) > 160.0    11:13    Activated PTT (APTT) > 160.0   Radiology Results: [place]:    28-May-13 16:42, [place]  Color Flow Doppler Low Extrem Bilat   [place] Color Flow Doppler Low Extrem Bilat    REASON FOR EXAM:    Previous lower extremity DVT. F/U exam  COMMENTS:       PROCEDURE: [place]  - US DOPPLER LOW EXTR BILATERAL  - Sep 15 2011  4:42PM     RESULT: Comparison is made to a prior study dated 03/08/2009.    FINDINGS: Evaluation of the left lower extremity demonstrates areas of   incomplete compressibility and echogenicity within the proximal mid and   distal portions of the superficial femoral vein. Similar findings are   identified within the popliteal vein. There are areas of partial color   filling within the superficial femoral vein and popliteal vein. The   common femoral vein on the left demonstrates appropriate compressibility   and no abnormal foci of increased echogenicity.    Evaluation of the deep venous structures of the right lower extremity   demonstrates appropriate compressibility within the common femoral,   superficial femoral and popliteal veins as well as no evidence of   abnormal foci of increased echogenicity. There is appropriate color   filling of the vessels as well as appropriate spectral waveform imaging.    IMPRESSION:      1.  Findings consistent with nonocclusive thrombus within the superficial   femoral vein and popliteal vein on the left.  2.  The deep venous structures of the right lower extremity and the   common femoral vein on the left are unremarkable.  3.  Dr. Bary Castilla of the General Surgery Service was informed of these   findings at the time of the initial interpretation.    Thank you for this opportunity to contribute to the care of your patient.           Verified By: Mikki Santee, M.D., MD     Impression 1.  Hypercoagulable state          the patient will be bridged with heparin and will be returned to Coumadin after surgery 2.  DVT left leg           given this new finding and since the patient has not had an interuption in her  anticoagulation I recommend that and IVC filter be placed.  Especially since she will be off anticoagulation multiple times in the next few days.  The filter can be removed in a few months . 3.  Cholecystitis            plan for lap chole 4.  COPD            continue symbicort    Plan level 3 consult   Electronic Signatures: Hortencia Pilar (MD)  (Signed 29-May-13 15:42)  Authored: General Aspect/Present Illness, Home Medications, Allergies, History and Physical Exam, Vital Signs, Labs, Radiology, Impression/Plan   Last Updated: 29-May-13 15:42 by Hortencia Pilar (MD)

## 2014-08-12 NOTE — Op Note (Signed)
PATIENT NAME:  Ariel Smith, Ariel Smith MR#:  [id number] DATE OF BIRTH:  1945-06-27  DATE OF PROCEDURE:  09/18/2011  PREOPERATIVE DIAGNOSES:  1. Chronic cholecystitis and cholelithiasis.  2. Abnormal CT imaging of the stomach and colon.   POSTOPERATIVE DIAGNOSES:  1. Chronic cholecystitis and cholelithiasis.  2. Abnormal CT imaging of the stomach and colon.   OPERATIVE PROCEDURES:  1. Laparoscopic cholecystectomy with intraoperative cholangiograms, incidental umbilical hernia repair.  2. Upper endoscopy and biopsy of a gastric cardia polyp.  3. Colonoscopy with biopsies of the transverse, descending and sigmoid colon.   SURGEON: Earline Mayotte, MD  ANESTHESIA: General endotracheal under Dr. Noralyn Pick.   ESTIMATED BLOOD LOSS: Less than 5 mL.   CLINICAL NOTE: This 69 year old woman has had abdominal pain as well as a  mild change in stool habits. Ultrasound showed evidence of cholelithiasis without evidence of acute cholecystitis. CT scan showed thickening of the left colon suggestive for colitis and also a possible mass at the fundus of the stomach. 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 Her coagulation studies have been brought into reasonable levels and she is now brought to the Operating Room for planned cholecystectomy and upper and lower endoscopy.   OPERATIVE NOTE: With the patient under adequate general endotracheal anesthesia, the abdomen was prepped with ChloraPrep and draped. A very small umbilical hernia was noted. A vertical incision over the hernia was made and the preperitoneal fat reduced. 0 Prolene sutures were placed and a Hassan cannula inserted. There was no evidence of injury from initial port placement. The patient was placed in reverse Trendelenburg position and rolled to the left. A 10 mm step port was placed in the epigastrium and two 5 mm step ports placed on  the right side of the abdomen. The abdomen was noted to be moderately distended but did not show acute inflammation. There was a band of adhesions of the omentum to the undersurface of the liver. These were carefully taken down with electrocautery. The gallbladder was then decompressed to allow better exposure of the neck. The common bile duct could be visualized. The neck of the gallbladder was cleared. A small rent in the posterior aspect of the neck of the gallbladder was identified. This was eventually circled and a Kumar clamp placed. Fluoroscopic cholangiograms were completed using 18 mL of one-half strength Conray 60. This showed prompt filling of the right and left hepatic ducts and free flow into the duodenum. No evidence of retained stones. The cystic duct and multiple branches of the cystic artery adjacent to the gallbladder were then doubly clipped and divided. The gallbladder was removed from the liver bed making use of hook cautery dissection. Good hemostasis was noted. As she will be resuming anticoagulation later in the day, it was elected to place a piece of Surgicel into the gallbladder bed. The gallbladder was placed into an Endo Catch bag and then delivered through the umbilical port site. After re-establishing pneumoperitoneum inspection from the epigastric site showed no evidence of injury from initial port placement. The right upper quadrant was irrigated with lactated Ringer's solution. The abdomen was desufflated and ports removed under direct vision. The 0 Prolene sutures previously placed for the Hassan cannula were tied to obliterate the umbilical hernia defect. Skin incisions were then closed with 4-0 Vicryl subcuticular suture. Benzoin, Steri-Strips, Telfa, and Tegaderm dressing was then applied. Of note, the patient  did receive Kefzol prior to the procedure.   The upper endoscope was then passed without difficulty through the cricopharyngeus muscle and advanced to the second portion  of the duodenum. Because the endoscope would not record images the tech took pictures of the screen and these were subsequently labeled. The second portion of the duodenum was unremarkable. There was mild duodenitis without evidence of ulceration. This was in the duodenal bulb. The pylorus was unremarkable as was the gastric antrum. Retroflexed view of the cardia suggested a small hiatal hernia. With withdrawal of the scope there was a suggestion of a less than 1 cm polyp right at the GE junction. It was not sure whether this could be inflamed mucosa from previous episodes of vomiting or an actual polyp. Hot biopsy x2 was completed and the area coagulated. The esophagus was otherwise unremarkable. The stomach was desufflated and the scope removed.   The colonoscopy is completed with the patient in the supine position. The Olympus scope was advanced from the anus to the cecum. The appendiceal orifice and ileocecal valve were identified. The prep was reasonable. There was no suggestion of mucosal abnormality throughout the colon. Photographs were obtained of the appendix, orifice and ileocecal valve as well as the transverse, common descending and sigmoid colon. Biopsies were obtained from the transverse colon, descending colon and sigmoid colon to look for occult colitis. The retroflex view of the rectum was unremarkable.   The patient tolerated the procedure well and was taken to recovery in stable condition.    ____________________________ Earline Mayotte, MD jwb:cms D: 09/18/2011 17:20:22 ET T: 09/19/2011 09:47:06 ET JOB#: [id number]  cc: Earline Mayotte, MD, <Dictator> Leo Grosser, MD Renford Dills, MD Maren Reamer. Sherrlyn Hock, MD Lennox Pippins, MD Dolan Xia Brion Aliment MD ELECTRONICALLY SIGNED 09/21/2011 20:38

## 2014-08-12 NOTE — Op Note (Signed)
PATIENT NAME:  Ariel Smith, TARKINGTON MR#:  [id number] DATE OF BIRTH:  1945-05-15  DATE OF PROCEDURE:  09/17/2011  PREOPERATIVE DIAGNOSES:  1. End-stage renal disease.  2. Nonfunctional AV fistula due to inability to access the fistula.  3. Hypercoagulable state.  4. Biliary disease.  5. Deep venous thrombosis.   POSTOPERATIVE DIAGNOSES:  1. End-stage renal disease.  2. Nonfunctional AV fistula due to inability to access the fistula.  3. Hypercoagulable state.  4. Biliary disease.  5. Deep venous thrombosis.   PROCEDURE PERFORMED:   1. Ultrasound guidance for vascular access, right jugular vein.  2. Fluoroscopic guidance for placement of catheter.  3. Placement of a 19 cm tip-to-cuff tunneled hemodialysis catheter in the right jugular vein.   SURGEON: [name], MD   ANESTHESIA: Local with moderate conscious sedation.   ESTIMATED BLOOD LOSS: Approximately 25 mL.   INDICATION FOR PROCEDURE: The patient is a 69 year old white female with multiple ongoing issues. She has progressed to end-stage renal disease. Her fistula which was placed approximately two months ago has a nice thrill and bruit, but yesterday 40 minutes were spent trying to access the fistula without success by the most experienced tech that we had at the hospital. She will need this superficialized for eventual use and will need a PermCath in the interim. She needs dialysis today.   DESCRIPTION OF PROCEDURE: The patient was brought to the Vascular Interventional Radiology Suite: The right neck and chest were sterilely prepped and draped, and a sterile surgical field was created. The right jugular vein was visualized with ultrasound and found to be widely patent. It was then accessed under direct ultrasound guidance without difficulty with a Seldinger needle. After skin nick and dilatation, the peel-away sheath was placed over the wire and the wire and dilator were removed. I tunneled from the subclavicular incision to the  access site. Using fluoroscopic guidance, I selected a 19 cm tip-to-cuff  tunneled hemodialysis catheter and placed this through the peel-away sheath. The peel-away sheath was removed. The catheter tip was placed into the right atrium. It was brought out and the appropriate distal connectors were placed, and both lumens withdrew blood well and flushed easily with heparinized saline. Concentrated heparin solution was then placed. The access incision was closed with a single 4-0 Monocryl. A 4-0 Monocryl was placed around the exit site, and two Prolene sutures were used to secure the catheter to the chest wall. The patient tolerated the procedure well and was taken to the recovery room in stable condition.   ____________________________ [name], MD jsd:cbb D: 09/17/2011 15:24:57 ET T: 09/17/2011 15:38:39 ET JOB#: [id number]  cc: [name], MD, <Dictator> [name] MD ELECTRONICALLY SIGNED 09/24/2011 13:14

## 2014-08-12 NOTE — Op Note (Signed)
PATIENT NAME:  Ariel Smith, KATSNELSON MR#:  [id number] DATE OF BIRTH:  1946/01/05  DATE OF PROCEDURE:  09/21/2011  PREOPERATIVE DIAGNOSES:  1. End-stage renal disease.  2. Nonfunctional left radiocephalic AV fistula.  3. Hypercoagulable state.  4. Hypertension.   POSTOPERATIVE DIAGNOSES: 1. End-stage renal disease.  2. Nonfunctional left radiocephalic AV fistula.  3. Hypercoagulable state.  4. Hypertension.   PROCEDURE: Superficialization, revision with ligation of multiple branches left radiocephalic AV fistula.   SURGEON: [name], MD   ANESTHESIA: General.   ESTIMATED BLOOD LOSS: Approximately 25 mL.   INDICATION FOR PROCEDURE: The patient is a 69 year old white female with end-stage renal disease and multiple other issues has been in the hospital having to get her gallbladder removed. 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 Her AV fistula was not functional largely due to the depth of the arm and she is brought to the operating room for revision of this fistula to make it a usable permanent access. Risks and benefits were discussed. Informed consent was obtained.   DESCRIPTION OF PROCEDURE: The patient is brought to the operative suite and after an adequate level of general endotracheal anesthesia was obtained her left upper extremity was sterilely prepped and draped and a sterile surgical field was created. An incision overlying the AV fistula with palpable thrill not far beyond the previous anastomosis was created. From this it was extended bit by bit as the vein was dissected out to make an incision directly over the vein. The vein was quite deep and there was significant hematoma from their attempts at access last week. In addition, there were multiple branches. There was a large branch several centimeters beyond the anastomosis. This was ligated and divided between  silk ties as were multiple small to medium branches all the way up to near the antecubital fossa. Vein was freed, marked for orientation. The deep tissue was then closed with interrupted 3-0 Vicryl sutures to bring the fistula up closer to the skin and then the subcutaneous tissue and skin were closed directly overlying the vein of the AV fistula with a 3-0 Vicryl and a 4-0 Monocryl. Dermabond was placed as a dressing. The patient tolerated the procedure well and was taken to the recovery room in stable condition.   ____________________________ [name], MD jsd:drc D: 09/21/2011 15:40:45 ET T: 09/21/2011 16:44:57 ET JOB#: [id number]  cc: [name], MD, <Dictator> [name] MD ELECTRONICALLY SIGNED 09/24/2011 13:15

## 2014-08-12 NOTE — Consult Note (Signed)
Brief Consult Note: Diagnosis: DVT.   Patient was seen by consultant.   Recommend to proceed with surgery or procedure.   Discussed with Attending MD.   Comments: given the new findings on [place] the patient should have an IVC filter placed which will be arranged for today.  Electronic Signatures: Levora Dredge (MD)  (Signed [phone] 06:54)  Authored: Brief Consult Note   Last Updated: 29-May-13 06:54 by Levora Dredge (MD)

## 2014-08-17 ENCOUNTER — Encounter: Payer: Self-pay | Admitting: Family Medicine

## 2014-08-17 ENCOUNTER — Ambulatory Visit (INDEPENDENT_AMBULATORY_CARE_PROVIDER_SITE_OTHER): Payer: Medicare Other | Admitting: Family Medicine

## 2014-08-17 VITALS — BP 136/74 | HR 94 | Temp 97.9°F | Wt 208.5 lb

## 2014-08-17 DIAGNOSIS — M546 Pain in thoracic spine: Secondary | ICD-10-CM | POA: Diagnosis not present

## 2014-08-17 MED ORDER — BACLOFEN 10 MG PO TABS: 5.0000 mg | ORAL_TABLET | Freq: Two times a day (BID) | ORAL | Status: DC | PRN

## 2014-08-17 NOTE — Progress Notes (Signed)
BP 136/74 mmHg  Pulse 94  Temp(Src) 97.9 F (36.6 C) (Oral)  Wt 208 lb 8 oz (94.575 kg)  SpO2 96%   CC: back pain  Subjective:    Patient ID: Ariel Smith, female    DOB: 1946-02-19, 69 y.o.   MRN: [id number]  HPI: FAVIANA SANDVICK is a 69 y.o. female presenting on 08/17/2014 for Back Pain   Back pain - ongoing since 12/2013 after thoracentesis for recurrent pleural effusion thought due to peritoneal fluid translocation. Had 1.3L fluid removed. She did have normal CXR done last month by Dr Thedore Mins and I will request records. No recent fall. Pain mainly R mid back without radiation. Tylenol/tramadol not touching pain.   No fever, shooting pain down legs, numbness or weakness of legs, bowel/bladder incontinence. No saddle anesthesia  Ongoing chronic cough for last 2 years. Treats with tramadol.   ESRD on HD TThSat Osteopenia by DEXA 01/2013. No h/o compression vertebral fractures.   Relevant past medical, surgical, family and social history reviewed and updated as indicated. Interim medical history since our last visit reviewed. Allergies and medications reviewed and updated. Current Outpatient Prescriptions on File Prior to Visit  Medication Sig  . acetaminophen (TYLENOL) 500 MG tablet Take 1,000 mg by mouth every 6 (six) hours as needed.  Marland Kitchen albuterol (VENTOLIN HFA) 108 (90 BASE) MCG/ACT inhaler Inhale 2 puffs into the lungs every 6 (six) hours as needed for wheezing.  . B Complex-C (B-COMPLEX WITH VITAMIN C) tablet Take 1 tablet by mouth daily.  . budesonide-formoterol (SYMBICORT) 160-4.5 MCG/ACT inhaler Inhale 2 puffs into the lungs 2 (two) times daily.  . calcitRIOL (ROCALTROL) 0.25 MCG capsule Take 0.25 mcg by mouth daily.  . Calcium Acetate 667 MG TABS Take 2 tablets by mouth 3 (three) times daily with meals.  . citalopram (CELEXA) 20 MG tablet Take 1 tablet (20 mg total) by mouth daily.  . diphenhydramine-acetaminophen (TYLENOL PM) 25-500 MG TABS Take 1 tablet by mouth at  bedtime as needed.  . folic acid-vitamin b complex-vitamin c-selenium-zinc (DIALYVITE) 3 MG TABS tablet Take 1 tablet by mouth daily.  Marland Kitchen lovastatin (MEVACOR) 20 MG tablet TAKE ONE TABLET BY MOUTH AT BEDTIME  . pantoprazole (PROTONIX) 40 MG tablet TAKE ONE TABLET BY MOUTH ONCE DAILY  . promethazine (PHENERGAN) 12.5 MG tablet Take 12.5 mg by mouth every 6 (six) hours as needed for nausea or vomiting.  . sevelamer carbonate (RENVELA) 800 MG tablet Take 800 mg by mouth as directed. 2 pills with snacks and 3 pills with full meals  . traMADol (ULTRAM) 50 MG tablet TAKE ONE-HALF TABLET BY MOUTH EVERY 12 HOURS AS NEEDED  . warfarin (COUMADIN) 2.5 MG tablet Take 2.5 mg by mouth daily.   No current facility-administered medications on file prior to visit.    Review of Systems Per HPI unless specifically indicated above     Objective:    BP 136/74 mmHg  Pulse 94  Temp(Src) 97.9 F (36.6 C) (Oral)  Wt 208 lb 8 oz (94.575 kg)  SpO2 96%  Wt Readings from Last 3 Encounters:  08/17/14 208 lb 8 oz (94.575 kg)  03/30/14 209 lb (94.802 kg)  12/04/13 200 lb 12 oz (91.06 kg)    Physical Exam  Constitutional: She appears well-developed and well-nourished. No distress.  HENT:  Mouth/Throat: Oropharynx is clear and moist. No oropharyngeal exudate.  Neck: Normal range of motion. Neck supple.  Cardiovascular: Normal rate, regular rhythm, normal heart sounds and intact distal pulses.  No murmur heard. Pulmonary/Chest: Effort normal and breath sounds normal. No respiratory distress. She has no wheezes. She has no rales.  Musculoskeletal: She exhibits no edema.  Mild pain mid thoracic spine above braline Mild R paraspinous mm tenderness inferior to rhomboid  Lymphadenopathy:    She has no cervical adenopathy.  Skin: Skin is warm and dry. No rash noted.  Psychiatric: She has a normal mood and affect.  Nursing note and vitals reviewed.      Assessment & Plan:   Problem List Items Addressed This  Visit    Right-sided thoracic back pain - Primary    Anticipate muscle strain. Doubt related to thoracentesis. Will want to ensure thoracic xrays obtained to r/o compression fracture in h/o osteopenia - will request records of recent film report today. In interim, continue tylenol and tramadol, add baclofen prn muscle spasm.      Relevant Medications   baclofen (LIORESAL) 10 MG tablet       Follow up plan: Return if symptoms worsen or fail to improve.

## 2014-08-17 NOTE — Patient Instructions (Addendum)
Sign ROI up front for recent xray at [facility] imaging in [place] Continue tramadol. May use muscle relaxant as well. We will call you with results of xray - if no thoracic xray was done I will have you return for this.

## 2014-08-17 NOTE — Progress Notes (Signed)
Pre visit review using our clinic review tool, if applicable. No additional management support is needed unless otherwise documented below in the visit note. 

## 2014-08-17 NOTE — Assessment & Plan Note (Signed)
Anticipate muscle strain. Doubt related to thoracentesis. Will want to ensure thoracic xrays obtained to r/o compression fracture in h/o osteopenia - will request records of recent film report today. In interim, continue tylenol and tramadol, add baclofen prn muscle spasm.

## 2014-08-19 DIAGNOSIS — J479 Bronchiectasis, uncomplicated: Secondary | ICD-10-CM

## 2014-08-19 HISTORY — DX: Bronchiectasis, uncomplicated: J47.9

## 2014-08-20 ENCOUNTER — Telehealth: Payer: Self-pay | Admitting: Family Medicine

## 2014-08-20 DIAGNOSIS — M546 Pain in thoracic spine: Secondary | ICD-10-CM

## 2014-08-20 NOTE — Telephone Encounter (Signed)
Plz notify I received xray from [facility] imaging - they obtained chest xray, I'd like pt to return at her convenience for thoracic xray and have ordered this.

## 2014-08-20 NOTE — Telephone Encounter (Signed)
Attempted to call patient. No answer and mailbox was full-unable to leave message. Will try again later.

## 2014-08-21 NOTE — Telephone Encounter (Signed)
Patient notified and will come in for xray.

## 2014-08-22 ENCOUNTER — Ambulatory Visit (INDEPENDENT_AMBULATORY_CARE_PROVIDER_SITE_OTHER)
Admission: RE | Admit: 2014-08-22 | Discharge: 2014-08-22 | Disposition: A | Discharge status: Home / Self Care | Payer: Medicare Other | Source: Ambulatory Visit | Attending: Family Medicine | Admitting: Family Medicine

## 2014-08-22 DIAGNOSIS — M546 Pain in thoracic spine: Secondary | ICD-10-CM | POA: Diagnosis not present

## 2014-08-23 ENCOUNTER — Encounter: Payer: Self-pay | Admitting: Family Medicine

## 2014-08-27 ENCOUNTER — Telehealth: Payer: Self-pay

## 2014-08-27 NOTE — Telephone Encounter (Signed)
On 08/25/14 pt was at dialysis center and felt very figgety and could not sit still and felt anxious; today still feels anxious and figgity. No SI/HI. Pt said she has other things to do today and cannot schedule appt and is supposed to go to dialysis on 08/28/14. Pt request med for anxiety. [facility]. Pt request cb.

## 2014-08-28 MED ORDER — HYDROXYZINE HCL 25 MG PO TABS: 12.5000 mg | ORAL_TABLET | Freq: Two times a day (BID) | ORAL | Status: DC | PRN

## 2014-08-28 NOTE — Telephone Encounter (Signed)
Patient notified and appreciated it.

## 2014-08-28 NOTE — Telephone Encounter (Signed)
Let's try hydroxyzine prn anxiety - sent to pharmacy.

## 2014-10-01 ENCOUNTER — Other Ambulatory Visit: Payer: Self-pay | Admitting: Family Medicine

## 2014-10-03 ENCOUNTER — Other Ambulatory Visit: Payer: Self-pay | Admitting: Family Medicine

## 2014-10-03 NOTE — Telephone Encounter (Signed)
Ok to refill 

## 2014-10-08 ENCOUNTER — Other Ambulatory Visit: Payer: Self-pay | Admitting: Family Medicine

## 2014-10-08 ENCOUNTER — Ambulatory Visit
Admission: RE | Admit: 2014-10-08 | Discharge: 2014-10-08 | Disposition: A | Discharge status: Home / Self Care | Payer: Medicare Other | Source: Ambulatory Visit | Attending: Family Medicine | Admitting: Family Medicine

## 2014-10-08 DIAGNOSIS — Z1239 Encounter for other screening for malignant neoplasm of breast: Secondary | ICD-10-CM

## 2014-10-08 DIAGNOSIS — Z1231 Encounter for screening mammogram for malignant neoplasm of breast: Secondary | ICD-10-CM | POA: Diagnosis present

## 2014-10-08 LAB — HM MAMMOGRAPHY: HM MAMMO: NORMAL

## 2014-10-09 ENCOUNTER — Encounter: Payer: Self-pay | Admitting: *Deleted

## 2014-10-18 ENCOUNTER — Other Ambulatory Visit: Payer: Self-pay | Admitting: Family Medicine

## 2014-10-24 ENCOUNTER — Telehealth: Payer: Self-pay | Admitting: Family Medicine

## 2014-10-24 NOTE — Telephone Encounter (Signed)
[name] pt daughter called wanting to know if Ariel Smith could be tested for dementia.  She is having problems remember things.  Repeating things over and over Offered to make appointment but she wanted to know if something could be done

## 2014-10-25 NOTE — Telephone Encounter (Addendum)
Spoke with patient's daughter. Appt scheduled for memory issues and geriatric assessment. I scheduled this for 11/01/14 at 6PM to give you enough time to do what you'll need to do. Her daughter will be with her.

## 2014-11-01 ENCOUNTER — Ambulatory Visit (INDEPENDENT_AMBULATORY_CARE_PROVIDER_SITE_OTHER): Payer: Medicare Other | Admitting: Family Medicine

## 2014-11-01 ENCOUNTER — Encounter: Payer: Self-pay | Admitting: Family Medicine

## 2014-11-01 VITALS — BP 112/68 | HR 93 | Temp 98.1°F | Wt 217.2 lb

## 2014-11-01 DIAGNOSIS — R413 Other amnesia: Secondary | ICD-10-CM | POA: Insufficient documentation

## 2014-11-01 DIAGNOSIS — N186 End stage renal disease: Secondary | ICD-10-CM | POA: Diagnosis not present

## 2014-11-01 DIAGNOSIS — F32A Depression, unspecified: Secondary | ICD-10-CM

## 2014-11-01 DIAGNOSIS — E538 Deficiency of other specified B group vitamins: Secondary | ICD-10-CM | POA: Diagnosis not present

## 2014-11-01 DIAGNOSIS — Z992 Dependence on renal dialysis: Secondary | ICD-10-CM

## 2014-11-01 DIAGNOSIS — F329 Major depressive disorder, single episode, unspecified: Secondary | ICD-10-CM

## 2014-11-01 MED ORDER — DONEPEZIL HCL 5 MG PO TABS: 5.0000 mg | ORAL_TABLET | Freq: Every day | ORAL | Status: DC

## 2014-11-01 MED ORDER — TRAZODONE HCL 50 MG PO TABS: 25.0000 mg | ORAL_TABLET | Freq: Every day | ORAL | Status: DC

## 2014-11-01 NOTE — Progress Notes (Signed)
Pre visit review using our clinic review tool, if applicable. No additional management support is needed unless otherwise documented below in the visit note. 

## 2014-11-01 NOTE — Patient Instructions (Addendum)
There seems to be some early memory loss ongoing based on testing today. Labwork today to evaluate for other causes of memory loss. Let's start aricept 5mg  daily for memory.  For depression - let's continue celexa and add trazodone 1/2-1 tablet at night time. This may help with sleep as well. Return in 3 months for follow up visit.

## 2014-11-01 NOTE — Progress Notes (Signed)
BP 112/68 mmHg  Pulse 93  Temp(Src) 98.1 F (36.7 C) (Oral)  Wt 217 lb 4 oz (98.544 kg)  SpO2 93%   CC: geriatric assessment  Subjective:    Patient ID: Ariel Smith, female    DOB: 08-06-1945, 69 y.o.   MRN: [id number]  HPI: Ariel Smith is a 69 y.o. female presenting on 11/01/2014 for Memory issues / Possible Geriatric Assessment   Here with daughter Daine Floras with memory concerns. Family also has memory concerns. [name] talks to mom on phone 4-5 times daily. Notices mom asks same questions repeatedly. Forgets pin # when using ATM card. Yesterday put pot on stove then went to take a nap.   Lives with husband who is able to care for her as well. Cares for pet chihuahua. Good family support.   At last [facility] 03/2014 - passed short memory screening questions.   Endorses some imbalance. Has cane but doesn't use. Occasional urine incontinence.  Endorses tremor.  Depression - on citalopram 38m daily. Feels mood fine. Daughter feels depression persistent. Increased appetite. Trouble sleeping.   No known stroke history.   Episode of vomiting blood last week, very small amount, felt lightheaded and dizzy. She drove herself to dialysis. Has had no more episodes of hematemesis.   ESRD on HD TThSat, nephrologist Dr SCandiss Norse  Geriatric Assessment: Activities of Daily Living:     Bathing- independent     Dressing- independent     Eating- independent     Toileting- independent     Transferring- independent     Continence- independent  Overall Assessment: independent   Instrumental Activities of Daily Living:     Transportation- independent     Meal/Food Preparation- independent     Shopping Errands- independent     Housekeeping/Chores- independent     Money Management/Finances- independent     Medication Management- independent     Ability to Use Telephone- independent     Laundry- independent  Overall Assessment:  independent   Mental Status Exam: 24/30. Clock drawing:  3/4  Relevant past medical, surgical, family and social history reviewed and updated as indicated. Interim medical history since our last visit reviewed. Allergies and medications reviewed and updated. Current Outpatient Prescriptions on File Prior to Visit  Medication Sig  . acetaminophen (TYLENOL) 500 MG tablet Take 1,000 mg by mouth every 6 (six) hours as needed.  .Marland Kitchenalbuterol (VENTOLIN HFA) 108 (90 BASE) MCG/ACT inhaler Inhale 2 puffs into the lungs every 6 (six) hours as needed for wheezing.  . baclofen (LIORESAL) 10 MG tablet TAKE ONE-HALF TABLET BY MOUTH TWICE DAILY AS NEEDED FOR MUSCLE SPASMS  . budesonide-formoterol (SYMBICORT) 160-4.5 MCG/ACT inhaler Inhale 2 puffs into the lungs 2 (two) times daily.  . calcitRIOL (ROCALTROL) 0.25 MCG capsule Take 0.25 mcg by mouth daily.  . Calcium Acetate 667 MG TABS Take 2 tablets by mouth 3 (three) times daily with meals.  . citalopram (CELEXA) 20 MG tablet Take 1 tablet (20 mg total) by mouth daily.  . diphenhydramine-acetaminophen (TYLENOL PM) 25-500 MG TABS Take 1 tablet by mouth at bedtime as needed.  . folic acid-vitamin b complex-vitamin c-selenium-zinc (DIALYVITE) 3 MG TABS tablet Take 1 tablet by mouth daily.  . hydrOXYzine (ATARAX/VISTARIL) 25 MG tablet TAKE ONE-HALF TABLET BY MOUTH TWICE DAILY AS NEEDED  . lovastatin (MEVACOR) 20 MG tablet TAKE ONE TABLET BY MOUTH AT BEDTIME  . pantoprazole (PROTONIX) 40 MG tablet TAKE ONE TABLET BY MOUTH ONCE DAILY  . promethazine (PHENERGAN) 12.5  MG tablet Take 12.5 mg by mouth every 6 (six) hours as needed for nausea or vomiting.  . sevelamer carbonate (RENVELA) 800 MG tablet Take 800 mg by mouth as directed. 2 pills with snacks and 3 pills with full meals  . traMADol (ULTRAM) 50 MG tablet TAKE ONE-HALF TABLET BY MOUTH EVERY 12 HOURS AS NEEDED  . warfarin (COUMADIN) 2.5 MG tablet Take 2.5 mg by mouth daily.   No current facility-administered medications on file prior to visit.   Past Medical History   Diagnosis Date  . Osteoarthritis   . Depression   . Frequent headaches   . HTN (hypertension)   . HLD (hyperlipidemia)   . History of colon polyps 2013    colonoscopy (Dr. Bary Castilla)  . 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 Systolic murmur     mild  . GERD (gastroesophageal reflux disease)   . COPD (chronic obstructive pulmonary disease)   . ESRD (end stage renal disease) 08/2011    TThSa, L forearm AV fistula, Dr. Candiss Norse  . Secondary hyperparathyroidism of renal origin   . Osteopenia 01/2013  . Bronchiectasis 08/2014     suggested by thoracic xray    Past Surgical History  Procedure Laterality Date  . Cholecystectomy  2012  . Bunionectomy Left 2003  . Replacement total knee Right 2006  . Shoulder arthroscopy Right 2009  . Colonoscopy  08/2011    colon biopsies, Dr. Fleet Contras  . Spirometry  04/2011    WNL  . US echocardiography  06/2008    EF >55%  . Esophagogastroduodenoscopy  08/2011    gastric cardia polyp  . Tonsillectomy  1955  . Tubal ligation  1980  . Exteriorization of a continuous ambulatory peritoneal dialysis catheter  01/2013    removal 12/2103 - Dr. Lucky Cowboy  . Dexa  01/2013    osteopenia with -2.0 at hip and spine  . Hospitalization  12/2013    recurrent R pleural effusion due to peritoneal fluid translocation s/p rpt thoracentesis with 1.3 L fluid removed, ERSD started on HD this hospitalization  . US echocardiography  12/2013    EF 55-60%, nl LV sys fxn, mild-mod MR, AS, increased LV posterior wall thickness, mild TR    History  Substance Use Topics  . Smoking status: Former Smoker -- 2.00 packs/day for 30 years    Start date: 04/20/1978    Quit date: 04/20/2009  . Smokeless tobacco: Never Used  . Alcohol Use: No    Review of Systems Per HPI unless specifically indicated above     Objective:    BP 112/68 mmHg  Pulse 93  Temp(Src) 98.1 F (36.7 C) (Oral)  Wt 217 lb 4  oz (98.544 kg)  SpO2 93%  Wt Readings from Last 3 Encounters:  11/01/14 217 lb 4 oz (98.544 kg)  08/17/14 208 lb 8 oz (94.575 kg)  03/30/14 209 lb (94.802 kg)    Physical Exam  Constitutional: She is oriented to person, place, and time. She appears well-developed and well-nourished. No distress.  HENT:  Mouth/Throat: Oropharynx is clear and moist. No oropharyngeal exudate.  Eyes: Conjunctivae and EOM are normal. Pupils are equal, round, and reactive to light.  Neck: Normal range of motion. Neck supple.  Cardiovascular: Normal rate, regular rhythm, normal heart sounds and intact distal pulses.   No murmur heard. Pulmonary/Chest: Effort normal and breath sounds normal. No respiratory distress.  She has no wheezes. She has no rales.  Musculoskeletal: She exhibits no edema.  Lymphadenopathy:    She has no cervical adenopathy.  Neurological: She is alert and oriented to person, place, and time. She has normal strength. No cranial nerve deficit or sensory deficit. She displays a negative Romberg sign. Coordination and gait normal.  CN 2-12 intact Station somewhat imbalanced L hand tremor present FTN intact No pronator drift  Skin: Skin is warm and dry. No rash noted.  Psychiatric: She has a normal mood and affect. Her behavior is normal. Thought content normal.  Nursing note and vitals reviewed.  Results for orders placed or performed in visit on 11/01/14  RPR  Result Value Ref Range   RPR Ser Ql NON REAC NON REAC      Assessment & Plan:  Extensive med rec performed with pt/daughter. Problem List Items Addressed This Visit    Depression    Not well controlled with PHQ9 = 19. Continue celexa 78m daily, add trazodone 25-567mnightly. ?pseudodementia.      Relevant Medications   traZODone (DESYREL) 50 MG tablet   ESRD (end stage renal disease) on dialysis    Continue f/u with nephrology SiCandiss NorseHD [facility]      Relevant Orders   Vit D  25 hydroxy (rtn osteoporosis monitoring)     Memory deficit - Primary    MMSE of 24/30 consistent with mild dementia or memory impairment. Reviewed etiologies of memory loss including different forms of dementia and pseudodementia. Check labwork for reversible causes (RPR, B12, TSH). Start aricept 59m259maily, monitor effect. RTC 3 mo f/u visit. Pt/daughter agree with plan. Noted tremor (predominantly affecting L arm) and imbalance but no rigidity, no masked fascies, doubt parkinson type picture. Encouraged she use her cane regularly to help prevent falls.      Relevant Orders   TSH   Vitamin B12   RPR (Completed)   Vitamin B12 deficiency    Normal on recent check. Recheck today.      Relevant Orders   Vitamin B12       Follow up plan: Return in about 3 months (around 02/01/2015), or as needed, for follow up visit.

## 2014-11-02 LAB — VITAMIN B12: Vitamin B-12: 300 pg/mL (ref 211–911)

## 2014-11-02 LAB — TSH: TSH: 1.27 u[IU]/mL (ref 0.35–4.50)

## 2014-11-02 LAB — RPR

## 2014-11-02 LAB — VITAMIN D 25 HYDROXY (VIT D DEFICIENCY, FRACTURES): VITD: 17.72 ng/mL — ABNORMAL LOW (ref 30.00–100.00)

## 2014-11-02 NOTE — Assessment & Plan Note (Signed)
Normal on recent check. Recheck today.

## 2014-11-02 NOTE — Assessment & Plan Note (Addendum)
MMSE of 24/30 consistent with mild dementia or memory impairment. Reviewed etiologies of memory loss including different forms of dementia and pseudodementia. Check labwork for reversible causes (RPR, B12, TSH). Start aricept 5mg  daily, monitor effect. RTC 3 mo f/u visit. Pt/daughter agree with plan. Noted tremor (predominantly affecting L arm) and imbalance but no rigidity, no masked fascies, doubt parkinson type picture. Encouraged she use her cane regularly to help prevent falls.

## 2014-11-02 NOTE — Assessment & Plan Note (Signed)
Continue f/u with nephrology Thedore Mins. HD TThSat

## 2014-11-02 NOTE — Assessment & Plan Note (Signed)
Not well controlled with PHQ9 = 19. Continue celexa 20mg  daily, add trazodone 25-50mg  nightly. ?pseudodementia.

## 2014-11-03 ENCOUNTER — Other Ambulatory Visit: Payer: Self-pay | Admitting: Family Medicine

## 2014-11-03 DIAGNOSIS — E559 Vitamin D deficiency, unspecified: Secondary | ICD-10-CM

## 2014-11-05 ENCOUNTER — Telehealth: Payer: Self-pay | Admitting: Family Medicine

## 2014-11-05 NOTE — Telephone Encounter (Signed)
Pt returned your call, please call [phone]

## 2014-11-08 NOTE — Telephone Encounter (Signed)
I returned call to pt, and it was documented in lab result.

## 2014-11-09 ENCOUNTER — Inpatient Hospital Stay
Admission: EM | Admit: 2014-11-09 | Discharge: 2014-11-22 | DRG: 356 | Disposition: A | Discharge status: Skilled Nursing Facility | Payer: Medicare Other | Attending: Internal Medicine | Admitting: Internal Medicine

## 2014-11-09 ENCOUNTER — Ambulatory Visit: Payer: Medicare Other

## 2014-11-09 ENCOUNTER — Encounter: Payer: Self-pay | Admitting: Medical Oncology

## 2014-11-09 ENCOUNTER — Telehealth: Payer: Self-pay | Admitting: *Deleted

## 2014-11-09 ENCOUNTER — Telehealth: Payer: Self-pay | Admitting: Primary Care

## 2014-11-09 DIAGNOSIS — I350 Nonrheumatic aortic (valve) stenosis: Secondary | ICD-10-CM

## 2014-11-09 DIAGNOSIS — Z86711 Personal history of pulmonary embolism: Secondary | ICD-10-CM

## 2014-11-09 DIAGNOSIS — Z79899 Other long term (current) drug therapy: Secondary | ICD-10-CM | POA: Diagnosis not present

## 2014-11-09 DIAGNOSIS — J441 Chronic obstructive pulmonary disease with (acute) exacerbation: Secondary | ICD-10-CM

## 2014-11-09 DIAGNOSIS — E559 Vitamin D deficiency, unspecified: Secondary | ICD-10-CM | POA: Diagnosis present

## 2014-11-09 DIAGNOSIS — Z992 Dependence on renal dialysis: Secondary | ICD-10-CM | POA: Diagnosis not present

## 2014-11-09 DIAGNOSIS — Z87891 Personal history of nicotine dependence: Secondary | ICD-10-CM

## 2014-11-09 DIAGNOSIS — Z800 Family history of malignant neoplasm of digestive organs: Secondary | ICD-10-CM | POA: Diagnosis not present

## 2014-11-09 DIAGNOSIS — F329 Major depressive disorder, single episode, unspecified: Secondary | ICD-10-CM | POA: Diagnosis present

## 2014-11-09 DIAGNOSIS — Z66 Do not resuscitate: Secondary | ICD-10-CM | POA: Diagnosis not present

## 2014-11-09 DIAGNOSIS — N186 End stage renal disease: Secondary | ICD-10-CM | POA: Diagnosis present

## 2014-11-09 DIAGNOSIS — Z86718 Personal history of other venous thrombosis and embolism: Secondary | ICD-10-CM

## 2014-11-09 DIAGNOSIS — I5033 Acute on chronic diastolic (congestive) heart failure: Secondary | ICD-10-CM | POA: Diagnosis not present

## 2014-11-09 DIAGNOSIS — R739 Hyperglycemia, unspecified: Secondary | ICD-10-CM | POA: Diagnosis present

## 2014-11-09 DIAGNOSIS — Z7951 Long term (current) use of inhaled steroids: Secondary | ICD-10-CM

## 2014-11-09 DIAGNOSIS — E785 Hyperlipidemia, unspecified: Secondary | ICD-10-CM | POA: Diagnosis present

## 2014-11-09 DIAGNOSIS — I82409 Acute embolism and thrombosis of unspecified deep veins of unspecified lower extremity: Secondary | ICD-10-CM | POA: Diagnosis not present

## 2014-11-09 DIAGNOSIS — D62 Acute posthemorrhagic anemia: Secondary | ICD-10-CM | POA: Diagnosis present

## 2014-11-09 DIAGNOSIS — K921 Melena: Secondary | ICD-10-CM | POA: Diagnosis present

## 2014-11-09 DIAGNOSIS — Z86010 Personal history of colonic polyps: Secondary | ICD-10-CM

## 2014-11-09 DIAGNOSIS — M1990 Unspecified osteoarthritis, unspecified site: Secondary | ICD-10-CM | POA: Diagnosis present

## 2014-11-09 DIAGNOSIS — Z8249 Family history of ischemic heart disease and other diseases of the circulatory system: Secondary | ICD-10-CM | POA: Diagnosis not present

## 2014-11-09 DIAGNOSIS — I959 Hypotension, unspecified: Secondary | ICD-10-CM

## 2014-11-09 DIAGNOSIS — J189 Pneumonia, unspecified organism: Secondary | ICD-10-CM | POA: Diagnosis not present

## 2014-11-09 DIAGNOSIS — D500 Iron deficiency anemia secondary to blood loss (chronic): Secondary | ICD-10-CM | POA: Insufficient documentation

## 2014-11-09 DIAGNOSIS — Z6839 Body mass index (BMI) 39.0-39.9, adult: Secondary | ICD-10-CM | POA: Diagnosis not present

## 2014-11-09 DIAGNOSIS — K922 Gastrointestinal hemorrhage, unspecified: Secondary | ICD-10-CM | POA: Insufficient documentation

## 2014-11-09 DIAGNOSIS — I120 Hypertensive chronic kidney disease with stage 5 chronic kidney disease or end stage renal disease: Secondary | ICD-10-CM | POA: Diagnosis present

## 2014-11-09 DIAGNOSIS — D510 Vitamin B12 deficiency anemia due to intrinsic factor deficiency: Secondary | ICD-10-CM | POA: Diagnosis present

## 2014-11-09 DIAGNOSIS — J9621 Acute and chronic respiratory failure with hypoxia: Secondary | ICD-10-CM | POA: Diagnosis not present

## 2014-11-09 DIAGNOSIS — Z96652 Presence of left artificial knee joint: Secondary | ICD-10-CM | POA: Diagnosis present

## 2014-11-09 DIAGNOSIS — E669 Obesity, unspecified: Secondary | ICD-10-CM | POA: Diagnosis present

## 2014-11-09 DIAGNOSIS — J9601 Acute respiratory failure with hypoxia: Secondary | ICD-10-CM | POA: Diagnosis not present

## 2014-11-09 DIAGNOSIS — Z452 Encounter for adjustment and management of vascular access device: Secondary | ICD-10-CM

## 2014-11-09 DIAGNOSIS — Y95 Nosocomial condition: Secondary | ICD-10-CM | POA: Diagnosis not present

## 2014-11-09 DIAGNOSIS — Z7901 Long term (current) use of anticoagulants: Secondary | ICD-10-CM

## 2014-11-09 DIAGNOSIS — R0602 Shortness of breath: Secondary | ICD-10-CM

## 2014-11-09 DIAGNOSIS — I82412 Acute embolism and thrombosis of left femoral vein: Secondary | ICD-10-CM | POA: Diagnosis present

## 2014-11-09 DIAGNOSIS — J80 Acute respiratory distress syndrome: Secondary | ICD-10-CM | POA: Diagnosis not present

## 2014-11-09 DIAGNOSIS — D631 Anemia in chronic kidney disease: Secondary | ICD-10-CM | POA: Diagnosis present

## 2014-11-09 DIAGNOSIS — K2901 Acute gastritis with bleeding: Principal | ICD-10-CM | POA: Diagnosis present

## 2014-11-09 DIAGNOSIS — R0600 Dyspnea, unspecified: Secondary | ICD-10-CM

## 2014-11-09 DIAGNOSIS — D689 Coagulation defect, unspecified: Secondary | ICD-10-CM

## 2014-11-09 DIAGNOSIS — D6859 Other primary thrombophilia: Secondary | ICD-10-CM | POA: Diagnosis present

## 2014-11-09 DIAGNOSIS — M8580 Other specified disorders of bone density and structure, unspecified site: Secondary | ICD-10-CM | POA: Diagnosis present

## 2014-11-09 DIAGNOSIS — R571 Hypovolemic shock: Secondary | ICD-10-CM | POA: Diagnosis present

## 2014-11-09 DIAGNOSIS — D696 Thrombocytopenia, unspecified: Secondary | ICD-10-CM | POA: Diagnosis present

## 2014-11-09 DIAGNOSIS — Z9981 Dependence on supplemental oxygen: Secondary | ICD-10-CM

## 2014-11-09 DIAGNOSIS — R51 Headache: Secondary | ICD-10-CM | POA: Diagnosis present

## 2014-11-09 DIAGNOSIS — R0603 Acute respiratory distress: Secondary | ICD-10-CM | POA: Insufficient documentation

## 2014-11-09 DIAGNOSIS — N2581 Secondary hyperparathyroidism of renal origin: Secondary | ICD-10-CM | POA: Diagnosis present

## 2014-11-09 DIAGNOSIS — J449 Chronic obstructive pulmonary disease, unspecified: Secondary | ICD-10-CM | POA: Diagnosis not present

## 2014-11-09 DIAGNOSIS — Z515 Encounter for palliative care: Secondary | ICD-10-CM | POA: Diagnosis not present

## 2014-11-09 DIAGNOSIS — Z823 Family history of stroke: Secondary | ICD-10-CM | POA: Diagnosis not present

## 2014-11-09 DIAGNOSIS — R609 Edema, unspecified: Secondary | ICD-10-CM

## 2014-11-09 LAB — CBC
HEMATOCRIT: 18.9 % — AB (ref 35.0–47.0)
Hemoglobin: 6.4 g/dL — ABNORMAL LOW (ref 12.0–16.0)
MCH: 35 pg — ABNORMAL HIGH (ref 26.0–34.0)
MCHC: 33.7 g/dL (ref 32.0–36.0)
MCV: 103.7 fL — ABNORMAL HIGH (ref 80.0–100.0)
Platelets: 196 10*3/uL (ref 150–440)
RBC: 1.82 MIL/uL — ABNORMAL LOW (ref 3.80–5.20)
RDW: 12.8 % (ref 11.5–14.5)
WBC: 6.6 10*3/uL (ref 3.6–11.0)

## 2014-11-09 LAB — COMPREHENSIVE METABOLIC PANEL
ALBUMIN: 3.2 g/dL — AB (ref 3.5–5.0)
ALT: 18 U/L (ref 14–54)
ANION GAP: 13 (ref 5–15)
AST: 25 U/L (ref 15–41)
Alkaline Phosphatase: 58 U/L (ref 38–126)
BILIRUBIN TOTAL: 0.4 mg/dL (ref 0.3–1.2)
BUN: 59 mg/dL — ABNORMAL HIGH (ref 6–20)
CO2: 27 mmol/L (ref 22–32)
CREATININE: 5.49 mg/dL — AB (ref 0.44–1.00)
Calcium: 7.9 mg/dL — ABNORMAL LOW (ref 8.9–10.3)
Chloride: 98 mmol/L — ABNORMAL LOW (ref 101–111)
GFR calc non Af Amer: 7 mL/min — ABNORMAL LOW (ref >60)
GFR, EST AFRICAN AMERICAN: 8 mL/min — AB (ref >60)
GLUCOSE: 142 mg/dL — AB (ref 65–99)
Potassium: 4.2 mmol/L (ref 3.5–5.1)
Sodium: 138 mmol/L (ref 135–145)
Total Protein: 5.9 g/dL — ABNORMAL LOW (ref 6.5–8.1)

## 2014-11-09 LAB — HEMOGLOBIN AND HEMATOCRIT, BLOOD
HCT: 16.9 % — ABNORMAL LOW (ref 35.0–47.0)
HEMATOCRIT: 21.9 % — AB (ref 35.0–47.0)
HEMOGLOBIN: 5.7 g/dL — AB (ref 12.0–16.0)
Hemoglobin: 7.6 g/dL — ABNORMAL LOW (ref 12.0–16.0)

## 2014-11-09 LAB — PROTIME-INR
INR: 2.69
INR: 2.73
INR: 1.26
PROTHROMBIN TIME: 28.7 s — AB (ref 11.4–15.0)
Prothrombin Time: 29 seconds — ABNORMAL HIGH (ref 11.4–15.0)
Prothrombin Time: 16 seconds — ABNORMAL HIGH (ref 11.4–15.0)

## 2014-11-09 LAB — PREPARE RBC (CROSSMATCH)

## 2014-11-09 LAB — ABO/RH: ABO/RH(D): O POS

## 2014-11-09 LAB — MRSA PCR SCREENING: MRSA by PCR: NEGATIVE

## 2014-11-09 MED ORDER — SODIUM CHLORIDE 0.9 % IV SOLN
10.0000 mL/h | Freq: Once | INTRAVENOUS | Status: AC
  Administered 2014-11-09: 10 mL/h via INTRAVENOUS
  Filled 2014-11-09: qty 1000

## 2014-11-09 MED ORDER — ALBUTEROL SULFATE (2.5 MG/3ML) 0.083% IN NEBU
2.5000 mg | INHALATION_SOLUTION | Freq: Four times a day (QID) | RESPIRATORY_TRACT | Status: DC | PRN
  Administered 2014-11-12: 2.5 mg via RESPIRATORY_TRACT
  Filled 2014-11-09: qty 3

## 2014-11-09 MED ORDER — TRAZODONE HCL 50 MG PO TABS
25.0000 mg | ORAL_TABLET | Freq: Every day | ORAL | Status: DC
  Administered 2014-11-09: 25 mg via ORAL
  Administered 2014-11-10 – 2014-11-11 (×2): 50 mg via ORAL
  Administered 2014-11-12: 25 mg via ORAL
  Administered 2014-11-13 – 2014-11-21 (×9): 50 mg via ORAL
  Filled 2014-11-09: qty 2
  Filled 2014-11-09 (×6): qty 1
  Filled 2014-11-09: qty 2
  Filled 2014-11-09: qty 1
  Filled 2014-11-09: qty 2
  Filled 2014-11-09 (×3): qty 1
  Filled 2014-11-09: qty 2

## 2014-11-09 MED ORDER — SODIUM CHLORIDE 0.9 % IV BOLUS (SEPSIS)
500.0000 mL | Freq: Once | INTRAVENOUS | Status: AC
  Administered 2014-11-09: 500 mL via INTRAVENOUS

## 2014-11-09 MED ORDER — B COMPLEX-C PO TABS
1.0000 | ORAL_TABLET | Freq: Every day | ORAL | Status: DC
  Filled 2014-11-09: qty 1

## 2014-11-09 MED ORDER — BUDESONIDE-FORMOTEROL FUMARATE 160-4.5 MCG/ACT IN AERO
2.0000 | INHALATION_SPRAY | Freq: Two times a day (BID) | RESPIRATORY_TRACT | Status: DC
  Administered 2014-11-09 – 2014-11-16 (×15): 2 via RESPIRATORY_TRACT
  Filled 2014-11-09: qty 6

## 2014-11-09 MED ORDER — SODIUM CHLORIDE 0.9 % IV SOLN
8.0000 mg/h | INTRAVENOUS | Status: DC
  Administered 2014-11-09 – 2014-11-12 (×7): 8 mg/h via INTRAVENOUS
  Filled 2014-11-09 (×7): qty 80

## 2014-11-09 MED ORDER — TRAMADOL HCL 50 MG PO TABS
50.0000 mg | ORAL_TABLET | Freq: Two times a day (BID) | ORAL | Status: DC | PRN
  Administered 2014-11-09 – 2014-11-21 (×3): 50 mg via ORAL
  Filled 2014-11-09 (×3): qty 1

## 2014-11-09 MED ORDER — HYDROXYZINE HCL 25 MG PO TABS
25.0000 mg | ORAL_TABLET | Freq: Three times a day (TID) | ORAL | Status: DC | PRN
  Administered 2014-11-10 – 2014-11-14 (×4): 25 mg via ORAL
  Filled 2014-11-09 (×4): qty 1

## 2014-11-09 MED ORDER — MENTHOL 3 MG MT LOZG
1.0000 | LOZENGE | OROMUCOSAL | Status: DC | PRN
  Administered 2014-11-09 – 2014-11-10 (×6): 3 mg via ORAL
  Filled 2014-11-09 (×3): qty 9

## 2014-11-09 MED ORDER — DONEPEZIL HCL 5 MG PO TABS
5.0000 mg | ORAL_TABLET | Freq: Every day | ORAL | Status: DC
  Administered 2014-11-09 – 2014-11-21 (×13): 5 mg via ORAL
  Filled 2014-11-09 (×13): qty 1

## 2014-11-09 MED ORDER — SEVELAMER CARBONATE 800 MG PO TABS
2400.0000 mg | ORAL_TABLET | Freq: Three times a day (TID) | ORAL | Status: DC
Start: 2014-11-09 — End: 2014-11-18
  Administered 2014-11-10 – 2014-11-18 (×18): 2400 mg via ORAL
  Filled 2014-11-09 (×18): qty 3

## 2014-11-09 MED ORDER — PANTOPRAZOLE SODIUM 40 MG IV SOLR: 40.0000 mg | Freq: Two times a day (BID) | INTRAVENOUS | Status: DC

## 2014-11-09 MED ORDER — PANTOPRAZOLE SODIUM 40 MG IV SOLR
40.0000 mg | Freq: Once | INTRAVENOUS | Status: AC
  Administered 2014-11-09: 40 mg via INTRAVENOUS
  Filled 2014-11-09: qty 40

## 2014-11-09 MED ORDER — PROTHROMBIN COMPLEX CONC HUMAN 500 UNITS IV KIT
2500.0000 [IU] | PACK | Status: AC
  Administered 2014-11-09: 2500 [IU] via INTRAVENOUS
  Filled 2014-11-09: qty 80

## 2014-11-09 MED ORDER — ALBUTEROL SULFATE HFA 108 (90 BASE) MCG/ACT IN AERS: 2.0000 | INHALATION_SPRAY | Freq: Four times a day (QID) | RESPIRATORY_TRACT | Status: DC | PRN

## 2014-11-09 MED ORDER — SEVELAMER CARBONATE 800 MG PO TABS
1600.0000 mg | ORAL_TABLET | Freq: Three times a day (TID) | ORAL | Status: DC | PRN
  Administered 2014-11-10: 1600 mg via ORAL
  Filled 2014-11-09 (×3): qty 2

## 2014-11-09 MED ORDER — ADULT MULTIVITAMIN W/MINERALS CH
1.0000 | ORAL_TABLET | Freq: Every day | ORAL | Status: DC
  Administered 2014-11-09 – 2014-11-22 (×13): 1 via ORAL
  Filled 2014-11-09 (×13): qty 1

## 2014-11-09 MED ORDER — VITAMIN K1 10 MG/ML IJ SOLN
10.0000 mg | INTRAVENOUS | Status: AC
  Administered 2014-11-09: 10 mg via INTRAVENOUS
  Filled 2014-11-09 (×3): qty 1

## 2014-11-09 MED ORDER — BENZONATATE 100 MG PO CAPS
100.0000 mg | ORAL_CAPSULE | Freq: Four times a day (QID) | ORAL | Status: DC | PRN
  Administered 2014-11-09 – 2014-11-11 (×6): 100 mg via ORAL
  Filled 2014-11-09 (×7): qty 1

## 2014-11-09 MED ORDER — CYANOCOBALAMIN 1000 MCG/ML IJ SOLN
1000.0000 ug | Freq: Once | INTRAMUSCULAR | Status: AC
  Administered 2014-11-09: 1000 ug via INTRAMUSCULAR
  Filled 2014-11-09: qty 1

## 2014-11-09 NOTE — Progress Notes (Addendum)
ANTICOAGULATION CONSULT NOTE - Initial Consult  Pharmacy Consult for Kcentra  Indication: Upper GIB  Allergies  Allergen Reactions  . Other Other (See Comments)    Pt states that she is allergic to Midrin and it causes headaches.     Patient Measurements: Height:  (162.6 cm) Weight: 217 lb (98.431 kg) IBW/kg (Calculated) : 54.7   Vital Signs: Temp: 98 F (36.7 C) (07/22 1515) Temp Source: Oral (07/22 1515) BP: 109/70 mmHg (07/22 1530) Pulse Rate: 95 (07/22 1515)  Labs:  Recent Labs  11/09/14 1132 11/09/14 1430  HGB 6.4* 5.7*  HCT 18.9* 16.9*  PLT 196  --   LABPROT 28.7* 29.0*  INR 2.69 2.73  CREATININE 5.49*  --     Estimated Creatinine Clearance: 11.2 mL/min (by C-G formula based on Cr of 5.49).   Medical History: Past Medical History  Diagnosis Date  . Osteoarthritis   . Depression   . Frequent headaches   . HTN (hypertension)   . HLD (hyperlipidemia)   . History of colon polyps 2013    colonoscopy ([name])  . 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 Systolic murmur     mild  . GERD (gastroesophageal reflux disease)   . COPD (chronic obstructive pulmonary disease)   . ESRD (end stage renal disease) 08/2011    TThSa, L forearm AV fistula, [name]  . Secondary hyperparathyroidism of renal origin   . Osteopenia 01/2013  . Bronchiectasis 08/2014     suggested by thoracic xray    Medications:  Prescriptions prior to admission  Medication Sig Dispense Refill Last Dose  . acetaminophen (TYLENOL) 500 MG tablet Take 1,000 mg by mouth every 6 (six) hours as needed for mild pain or headache.    11/08/2014 at [facility]  . albuterol (VENTOLIN HFA) 108 (90 BASE) MCG/ACT inhaler Inhale 2 puffs into the lungs every 6 (six) hours as needed for wheezing or shortness of breath.    11/09/2014 at Unknown time  . baclofen (LIORESAL) 10 MG tablet Take 5-10 mg by mouth 2 (two)  times daily as needed for muscle spasms.   Past Week at Unknown time  . budesonide-formoterol (SYMBICORT) 160-4.5 MCG/ACT inhaler Inhale 2 puffs into the lungs 2 (two) times daily.   11/09/2014 at Unknown time  . calcitRIOL (ROCALTROL) 0.25 MCG capsule Take 0.25 mcg by mouth daily.   11/09/2014 at Unknown time  . calcium acetate (PHOSLO) 667 MG capsule Take 2,001 mg by mouth 3 (three) times daily with meals.    11/09/2014 at Unknown time  . cinacalcet (SENSIPAR) 60 MG tablet Take 60 mg by mouth daily.   11/08/2014 at Unknown time  . citalopram (CELEXA) 20 MG tablet Take 1 tablet (20 mg total) by mouth daily. 90 tablet 3 11/09/2014 at Unknown time  . donepezil (ARICEPT) 5 MG tablet Take 1 tablet (5 mg total) by mouth at bedtime. (Patient taking differently: Take 2.5 mg by mouth at bedtime. ) 30 tablet 3 11/08/2014 at Unknown time  . hydrOXYzine (ATARAX/VISTARIL) 25 MG tablet Take 12.5-25 mg by mouth 2 (two) times daily as needed for anxiety.   PRN at PRN  . lovastatin (MEVACOR) 20 MG tablet Take 20 mg by mouth at bedtime.   11/08/2014 at Unknown time  . pantoprazole (PROTONIX) 40 MG tablet Take 40 mg by mouth daily as needed (for heartburn/indigestion).   PRN at PRN  .  promethazine (PHENERGAN) 12.5 MG tablet Take 12.5 mg by mouth every 6 (six) hours as needed for nausea or vomiting.   PRN at PRN  . traMADol (ULTRAM) 50 MG tablet Take 25 mg by mouth every 12 (twelve) hours as needed for moderate pain.   11/08/2014 at Unsure  . traZODone (DESYREL) 50 MG tablet Take 0.5-1 tablets (25-50 mg total) by mouth at bedtime. (Patient taking differently: Take 25 mg by mouth at bedtime. ) 30 tablet 3 11/08/2014 at Unknown time  . warfarin (COUMADIN) 2.5 MG tablet Take 2.5 mg by mouth at bedtime.    11/08/2014 at Unknown time  . cyanocobalamin (,VITAMIN B-12,) 1000 MCG/ML injection Inject 1,000 mcg into the muscle every 30 (thirty) days. Start 10/2014 1 mL 0 unknown at unknown   Scheduled:  . sodium chloride  10 mL/hr  Intravenous Once  . budesonide-formoterol  2 puff Inhalation BID  . cyanocobalamin  1,000 mcg Intramuscular Once  . donepezil  5 mg Oral QHS  . multivitamin with minerals  1 tablet Oral Daily  . [START ON 11/13/2014] pantoprazole (PROTONIX) IV  40 mg Intravenous Q12H  . phytonadione (VITAMIN K) IV  10 mg Intravenous STAT  . sevelamer carbonate  2,400 mg Oral TID WC  . traZODone  25-50 mg Oral QHS   Infusions:  . pantoprozole (PROTONIX) infusion     PRN: albuterol, hydrOXYzine, sevelamer carbonate, traMADol  Assessment: Patient on warfarin PTA admitted with UGIB ordered Kcentra and vitamin K.  Goal of Therapy:  Reversal of anticoagulatiion.    Plan:  KCentra 2500 units given along with vitamin k 10 mg. Will f/u INR 1 hour after infusion then q 6 hours x 4 and daily x 2 thereafter.   Luisa Hart D 11/09/2014,3:44 PM  Patient receiving transfusions x 2 so will have to wait until afterwards to draw first INR.

## 2014-11-09 NOTE — Consult Note (Signed)
GI Inpatient Consult Note  Reason for Consult: Melana on Coumadin   Attending Requesting Consult: Dr. Amado Coe  History of Present Illness: Ariel Smith is a 69 y.o. female who reports that she started having black stools in the past week.  She endorses weakness, shortness of breath, and dizziness.  She denies abdominal pain, nausea or vomiting.  She reports the stools as watery.  She receives dialysis on Tuesdays, Thursdays, and Saturdays.  She did have dialysis yesterday.   She grew even more weak and went to the ED.  She was found to be hypotensive.  She was heme-positive in the ED.  She started taking Coumadin in 2009 for blood clots.  She did have an IVC filter and her family reports that it was removed last year by [name].  I have been unable to locate this record.  The patient reports that prior to this last episode she has bowel movements on a fairly normal basis, she will experience constipation 1 to 2 times a week.  She denies using anything to help with her bowel movements.  She was on Protonix, does not take it any more.  She denies any issues with heartburn or acid reflux.  She had a cholecystectomy in 2012. She reports having an upper endoscopy in 2013 as well as a colonoscopy in 2013. She does report a polyp.  And was given a 5 year   follow-up time frame.  Her mother had colon cancer, diagnosed at the age of 23.  Patient has a significant smoking history, she quit in 2011, 60 year pack history.  She resides at home with her husband.  She reports her husband has been in and out of the hospital recently with procedures.  I spoke with him he reports he is doing well.  Has not had diarrhea or been sick recently.  The family also reported that the patient had a be transfused 2 years ago.  They report that she has a lot of dizziness, lightheadedness.  They shared their frustration with me, they are very anxious to find an answer for these recent problems.    She is on 2 L of  oxygen at home at night.  She reports that she does stay short of breath  Past Medical History:  Past Medical History  Diagnosis Date  . Osteoarthritis   . Depression   . Frequent headaches   . HTN (hypertension)   . HLD (hyperlipidemia)   . History of colon polyps 2013    colonoscopy ([name])  . 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 Systolic murmur     mild  . GERD (gastroesophageal reflux disease)   . COPD (chronic obstructive pulmonary disease)   . ESRD (end stage renal disease) 08/2011    TThSa, L forearm AV fistula, [name]  . Secondary hyperparathyroidism of renal origin   . Osteopenia 01/2013  . Bronchiectasis 08/2014     suggested by thoracic xray    Problem List: Patient Active Problem List   Diagnosis Date Noted  . UGI bleed 11/09/2014  . Vitamin D deficiency 11/03/2014  . Memory deficit 11/01/2014  . Right-sided thoracic back pain 08/17/2014  . Advanced care planning/counseling discussion 03/30/2014  . Health maintenance examination 03/30/2014  . Vitamin B12 deficiency 01/27/2014  . Bilateral leg edema 08/02/2013  . Medicare annual wellness visit, subsequent 01/27/2013  . Osteopenia  01/18/2013  . Chronic cough 10/22/2012  . Osteoarthritis   . Depression   . HTN (hypertension)   . HLD (hyperlipidemia)   . History of DVT of lower extremity   . GERD (gastroesophageal reflux disease)   . ESRD (end stage renal disease) on dialysis 08/19/2011    Past Surgical History: Past Surgical History  Procedure Laterality Date  . Cholecystectomy  2012  . Bunionectomy Left 2003  . Replacement total knee Right 2006  . Shoulder arthroscopy Right 2009  . Colonoscopy  08/2011    colon biopsies, [name]  . Spirometry  04/2011    WNL  . US echocardiography  06/2008    EF >55%  . Esophagogastroduodenoscopy  08/2011    gastric cardia polyp  . Tonsillectomy  1955  . Tubal  ligation  1980  . Exteriorization of a continuous ambulatory peritoneal dialysis catheter  01/2013    removal 12/2103 - [name]  . Dexa  01/2013    osteopenia with -2.0 at hip and spine  . Hospitalization  12/2013    recurrent R pleural effusion due to peritoneal fluid translocation s/p rpt thoracentesis with 1.3 L fluid removed, ERSD started on HD this hospitalization  . US echocardiography  12/2013    EF 55-60%, nl LV sys fxn, mild-mod MR, AS, increased LV posterior wall thickness, mild TR    Allergies: Allergies  Allergen Reactions  . Other Other (See Comments)    Pt states that she is allergic to Midrin and it causes headaches.     Home Medications: Prescriptions prior to admission  Medication Sig Dispense Refill Last Dose  . acetaminophen (TYLENOL) 500 MG tablet Take 1,000 mg by mouth every 6 (six) hours as needed for mild pain or headache.    11/08/2014 at [facility]  . albuterol (VENTOLIN HFA) 108 (90 BASE) MCG/ACT inhaler Inhale 2 puffs into the lungs every 6 (six) hours as needed for wheezing or shortness of breath.    11/09/2014 at Unknown time  . baclofen (LIORESAL) 10 MG tablet Take 5-10 mg by mouth 2 (two) times daily as needed for muscle spasms.   Past Week at Unknown time  . budesonide-formoterol (SYMBICORT) 160-4.5 MCG/ACT inhaler Inhale 2 puffs into the lungs 2 (two) times daily.   11/09/2014 at Unknown time  . calcitRIOL (ROCALTROL) 0.25 MCG capsule Take 0.25 mcg by mouth daily.   11/09/2014 at Unknown time  . calcium acetate (PHOSLO) 667 MG capsule Take 2,001 mg by mouth 3 (three) times daily with meals.    11/09/2014 at Unknown time  . cinacalcet (SENSIPAR) 60 MG tablet Take 60 mg by mouth daily.   11/08/2014 at Unknown time  . citalopram (CELEXA) 20 MG tablet Take 1 tablet (20 mg total) by mouth daily. 90 tablet 3 11/09/2014 at Unknown time  . donepezil (ARICEPT) 5 MG tablet Take 1 tablet (5 mg total) by mouth at bedtime. (Patient taking differently: Take 2.5 mg by mouth at bedtime.  ) 30 tablet 3 11/08/2014 at Unknown time  . hydrOXYzine (ATARAX/VISTARIL) 25 MG tablet Take 12.5-25 mg by mouth 2 (two) times daily as needed for anxiety.   PRN at PRN  . lovastatin (MEVACOR) 20 MG tablet Take 20 mg by mouth at bedtime.   11/08/2014 at Unknown time  . pantoprazole (PROTONIX) 40 MG tablet Take 40 mg by mouth daily as needed (for heartburn/indigestion).   PRN at PRN  . promethazine (PHENERGAN) 12.5 MG tablet Take 12.5 mg by mouth every 6 (six) hours as  needed for nausea or vomiting.   PRN at PRN  . traMADol (ULTRAM) 50 MG tablet Take 25 mg by mouth every 12 (twelve) hours as needed for moderate pain.   11/08/2014 at Unsure  . traZODone (DESYREL) 50 MG tablet Take 0.5-1 tablets (25-50 mg total) by mouth at bedtime. (Patient taking differently: Take 25 mg by mouth at bedtime. ) 30 tablet 3 11/08/2014 at Unknown time  . warfarin (COUMADIN) 2.5 MG tablet Take 2.5 mg by mouth at bedtime.    11/08/2014 at Unknown time  . cyanocobalamin (,VITAMIN B-12,) 1000 MCG/ML injection Inject 1,000 mcg into the muscle every 30 (thirty) days. Start 10/2014 1 mL 0 unknown at unknown   Home medication reconciliation was completed with the patient.   Scheduled Inpatient Medications:   . budesonide-formoterol  2 puff Inhalation BID  . donepezil  5 mg Oral QHS  . multivitamin with minerals  1 tablet Oral Daily  . [START ON 11/13/2014] pantoprazole (PROTONIX) IV  40 mg Intravenous Q12H  . phytonadione (VITAMIN K) IV  10 mg Intravenous STAT  . sevelamer carbonate  2,400 mg Oral TID WC  . traZODone  25-50 mg Oral QHS    Continuous Inpatient Infusions:   . pantoprozole (PROTONIX) infusion 8 mg/hr (11/09/14 1630)    PRN Inpatient Medications:  albuterol, hydrOXYzine, sevelamer carbonate, traMADol  Family History: family history includes Cancer in her father; Cancer (age of onset: 54) in her daughter; Cancer (age of onset: 75) in her mother; Deep vein thrombosis in her brother; Dementia (age of onset: 48)  in her father; Hypertension in her brother and father; Stroke in her mother. There is no history of Diabetes or CAD.   Social History:   reports that she quit smoking about 5 years ago. She started smoking about 36 years ago. She has never used smokeless tobacco. She reports that she does not drink alcohol or use illicit drugs.   Review of Systems: Constitutional: Weight is stable.  Eyes: No changes in vision. ENT: No oral lesions, sore throat.  GI: see HPI.  Heme/Lymph: No easy bruising.  CV: No chest pain.  GU: No hematuria.  Integumentary: No rashes.  Neuro: No headaches.  Psych: No depression/anxiety.  Endocrine: No heat/cold intolerance.  Allergic/Immunologic: No urticaria.  Resp: No cough,positive for SOB  Musculoskeletal: No joint swelling.    Physical Examination: BP 109/70 mmHg  Pulse 95  Temp(Src) 98 F (36.7 C) (Oral)  Resp 21  Ht 5\' 4"  (1.626 m)  Wt 98.431 kg (217 lb)  BMI 37.23 kg/m2  SpO2 100% Gen: NAD, alert and oriented x 4, very pale HEENT: PEERLA, EOMI, Neck: supple, no JVD or thyromegaly Chest: CTA bilaterally, no wheezes, crackles, or other adventitious sounds CV: murmur heard Abd: soft, NT, ND, +BS in all four quadrants; no HSM, guarding, ridigity, or rebound tenderness Ext: no edema, well perfused with 2+ pulses, Skin: no rash or lesions noted Lymph: no LAD  Data: Lab Results  Component Value Date   WBC 6.6 11/09/2014   HGB 5.7* 11/09/2014   HCT 16.9* 11/09/2014   MCV 103.7* 11/09/2014   PLT 196 11/09/2014    Recent Labs Lab 11/09/14 1132 11/09/14 1430  HGB 6.4* 5.7*   Lab Results  Component Value Date   NA 138 11/09/2014   K 4.2 11/09/2014   CL 98* 11/09/2014   CO2 27 11/09/2014   BUN 59* 11/09/2014   CREATININE 5.49* 11/09/2014   Lab Results  Component Value Date   ALT  18 11/09/2014   AST 25 11/09/2014   ALKPHOS 58 11/09/2014   BILITOT 0.4 11/09/2014    Recent Labs Lab 11/09/14 1430  INR 2.73   Assessment/Plan: Ariel Smith is a 69 y.o. female with melana on coumadin  Recommendations:  we agree with continuing to monitor hemoglobin and transfusing as needed.  We will plan for colonoscopy on Monday for further evaluation.  It is okay to advance diet to clear liquids for now.  We will continue to follow with you. Thank you for the consult. Please call with questions or concerns.  Carney Harder, PA-C  I personally performed these services.

## 2014-11-09 NOTE — ED Notes (Signed)
Pt reports that she has noticed dark stools x 1 week with dizziness, weakness and low BP.

## 2014-11-09 NOTE — ED Notes (Signed)
Dr Fanny Bien performed hemoccult, positive.

## 2014-11-09 NOTE — H&P (Signed)
[facility] - [place] at [facility]   PATIENT NAME: Ariel Smith    MR#:  [id number]  DATE OF BIRTH:  31-Jul-1945  DATE OF ADMISSION:  11/09/2014  PRIMARY CARE PHYSICIAN: [name], MD   REQUESTING/REFERRING PHYSICIAN: Quale  CHIEF COMPLAINT:  Melena  HISTORY OF PRESENT ILLNESS:  Ariel Smith  is a 68 y.o. female with a known history of hypertension, hyperlipidemia, DVT and multiple other medical problems on Coumadin is brought into  the ED with a chief complaint of weakness and fatigue associated with black tarry stool. She had a prior colonoscopy many years ago and was told she had a single polyp. She denies vomiting blood except for 1 episode about 2 weeks  ago where she threw up and had a little bit of red blood in it. No chest pain . Reporting exertional dyspnea. Patient has history of end-stage renal disease and gets hemodialysis on Tuesday Thursday and Saturday. Her last dialysis was yesterday. Patient's hemoglobin today in the ED is at 6.4. Repeat hemoglobin is at 5.7. 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 Today her INR is at 2.69. ED physician has discussed her case with on-call gastroenterology Dr. Bluford Kaufmann and on call nephrology Dr. Cherylann Ratel   PAST MEDICAL HISTORY:   Past Medical History  Diagnosis Date  . Osteoarthritis   . Depression   . Frequent headaches   . HTN (hypertension)   . HLD (hyperlipidemia)   . History of colon polyps 2013    colonoscopy ([name])  . 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 Systolic murmur     mild  . GERD (gastroesophageal reflux disease)   . COPD (chronic obstructive pulmonary disease)   . ESRD (end stage renal disease) 08/2011    TThSa, L forearm AV fistula, [name]  . Secondary hyperparathyroidism of renal origin   . Osteopenia 01/2013  . Bronchiectasis 08/2014     suggested by thoracic xray     PAST SURGICAL HISTOIRY:   Past Surgical History  Procedure Laterality Date  . Cholecystectomy  2012  . Bunionectomy Left 2003  . Replacement total knee Right 2006  . Shoulder arthroscopy Right 2009  . Colonoscopy  08/2011    colon biopsies, [name]  . Spirometry  04/2011    WNL  . US echocardiography  06/2008    EF >55%  . Esophagogastroduodenoscopy  08/2011    gastric cardia polyp  . Tonsillectomy  1955  . Tubal ligation  1980  . Exteriorization of a continuous ambulatory peritoneal dialysis catheter  01/2013    removal 12/2103 - [name]  . Dexa  01/2013    osteopenia with -2.0 at hip and spine  . Hospitalization  12/2013    recurrent R pleural effusion due to peritoneal fluid translocation s/p rpt thoracentesis with 1.3 L fluid removed, ERSD started on HD this hospitalization  . US echocardiography  12/2013    EF 55-60%, nl LV sys fxn, mild-mod MR, AS, increased LV posterior wall thickness, mild TR    SOCIAL HISTORY:   History  Substance Use Topics  . Smoking status: Former Smoker -- 2.00 packs/day for 30 years    Start date: 04/20/1978    Quit date: 04/20/2009  . Smokeless tobacco: Never Used  . Alcohol Use: No    FAMILY HISTORY:   Family History  Problem Relation Age of  Onset  . Deep vein thrombosis Brother   . Cancer Mother 59    colon  . Cancer Father     prostate  . Hypertension Father   . Hypertension Brother   . Stroke Mother   . Diabetes Neg Hx   . CAD Neg Hx   . Cancer Daughter 36    breast  . Dementia Father 35    DRUG ALLERGIES:   Allergies  Allergen Reactions  . Other Other (See Comments)    Pt states that she is allergic to Midrin and it causes headaches.     REVIEW OF SYSTEMS:  CONSTITUTIONAL: No fever, has fatigue and weakness.  EYES: No blurred or double vision.  EARS, NOSE, AND THROAT: No tinnitus or ear pain.  RESPIRATORY: No cough, shortness of breath, wheezing or hemoptysis.  CARDIOVASCULAR: No chest pain, orthopnea,  edema.  GASTROINTESTINAL: No nausea, vomiting, diarrhea or abdominal pain. Reporting one episode of hematemesis approximately 1-2 weeks ago. Reporting melena GENITOURINARY: No dysuria, hematuria.  ENDOCRINE: No polyuria, nocturia,  HEMATOLOGY: Denies easy bruising or bleeding SKIN: No rash or lesion. MUSCULOSKELETAL: No joint pain or arthritis.   NEUROLOGIC: No tingling, numbness, weakness.  PSYCHIATRY: No anxiety or depression.   MEDICATIONS AT HOME:   Prior to Admission medications   Medication Sig Start Date End Date Taking? Authorizing Provider  acetaminophen (TYLENOL) 500 MG tablet Take 1,000 mg by mouth every 6 (six) hours as needed for mild pain or headache.    Yes Historical Provider, MD  albuterol (VENTOLIN HFA) 108 (90 BASE) MCG/ACT inhaler Inhale 2 puffs into the lungs every 6 (six) hours as needed for wheezing or shortness of breath.    Yes Historical Provider, MD  baclofen (LIORESAL) 10 MG tablet Take 5-10 mg by mouth 2 (two) times daily as needed for muscle spasms.   Yes Historical Provider, MD  budesonide-formoterol (SYMBICORT) 160-4.5 MCG/ACT inhaler Inhale 2 puffs into the lungs 2 (two) times daily.   Yes Historical Provider, MD  calcitRIOL (ROCALTROL) 0.25 MCG capsule Take 0.25 mcg by mouth daily.   Yes Historical Provider, MD  calcium acetate (PHOSLO) 667 MG capsule Take 2,001 mg by mouth 3 (three) times daily with meals.    Yes Historical Provider, MD  cinacalcet (SENSIPAR) 60 MG tablet Take 60 mg by mouth daily.   Yes Historical Provider, MD  citalopram (CELEXA) 20 MG tablet Take 1 tablet (20 mg total) by mouth daily. 03/30/14  Yes [name], MD  donepezil (ARICEPT) 5 MG tablet Take 1 tablet (5 mg total) by mouth at bedtime. Patient taking differently: Take 2.5 mg by mouth at bedtime.  11/01/14  Yes [name], MD  hydrOXYzine (ATARAX/VISTARIL) 25 MG tablet Take 12.5-25 mg by mouth 2 (two) times daily as needed for anxiety.   Yes Historical Provider, MD   lovastatin (MEVACOR) 20 MG tablet Take 20 mg by mouth at bedtime.   Yes Historical Provider, MD  pantoprazole (PROTONIX) 40 MG tablet Take 40 mg by mouth daily as needed (for heartburn/indigestion).   Yes Historical Provider, MD  promethazine (PHENERGAN) 12.5 MG tablet Take 12.5 mg by mouth every 6 (six) hours as needed for nausea or vomiting.   Yes Historical Provider, MD  traMADol (ULTRAM) 50 MG tablet Take 25 mg by mouth every 12 (twelve) hours as needed for moderate pain.   Yes Historical Provider, MD  traZODone (DESYREL) 50 MG tablet Take 0.5-1 tablets (25-50 mg total) by mouth at bedtime. Patient taking differently: Take 25 mg by  mouth at bedtime.  11/01/14  Yes [name], MD  warfarin (COUMADIN) 2.5 MG tablet Take 2.5 mg by mouth at bedtime.    Yes Historical Provider, MD  cyanocobalamin (,VITAMIN B-12,) 1000 MCG/ML injection Inject 1,000 mcg into the muscle every 30 (thirty) days. Start 10/2014    [name], MD      VITAL SIGNS:  Blood pressure 109/70, pulse 95, temperature 98 F (36.7 C), temperature source Oral, resp. rate 21, height 5\' 4"  (1.626 m), weight 98.431 kg (217 lb), SpO2 100 %.  PHYSICAL EXAMINATION:  GENERAL:  69 y.o.-year-old patient lying in the bed with no acute distress.  EYES: Pupils equal, round, reactive to light and accommodation. No scleral icterus. Extraocular muscles intact.  HEENT: Head atraumatic, normocephalic. Oropharynx and nasopharynx clear.  NECK:  Supple, no jugular venous distention. No thyroid enlargement, no tenderness.  LUNGS: Normal breath sounds bilaterally, no wheezing, rales,rhonchi or crepitation. No use of accessory muscles of respiration.  CARDIOVASCULAR: S1, S2 normal. Positive ejection systolic murmurs, no rubs, or gallops.  ABDOMEN: Soft, nontender, nondistended. Bowel sounds present. No organomegaly or mass.  EXTREMITIES: No pedal edema, cyanosis, or clubbing.  NEUROLOGIC: Cranial nerves II through XII are intact. Muscle  strength 5/5 in all extremities. Sensation intact. Gait not checked.  PSYCHIATRIC: The patient is alert and oriented x 3.  SKIN: No obvious rash, lesion, or ulcer.   LABORATORY PANEL:   CBC  Recent Labs Lab 11/09/14 1132 11/09/14 1430  WBC 6.6  --   HGB 6.4* 5.7*  HCT 18.9* 16.9*  PLT 196  --    ------------------------------------------------------------------------------------------------------------------  Chemistries   Recent Labs Lab 11/09/14 1132  NA 138  K 4.2  CL 98*  CO2 27  GLUCOSE 142*  BUN 59*  CREATININE 5.49*  CALCIUM 7.9*  AST 25  ALT 18  ALKPHOS 58  BILITOT 0.4   ------------------------------------------------------------------------------------------------------------------  Cardiac Enzymes No results for input(s): TROPONINI in the last 168 hours. ------------------------------------------------------------------------------------------------------------------  RADIOLOGY:  No results found.  EKG:   Orders placed or performed in visit on 01/11/14  . EKG 12-Lead    IMPRESSION AND PLAN:  Ariel Smith  is a 70 y.o. female with a known history of hypertension, hyperlipidemia, DVT and multiple other medical problems on Coumadin is brought into  the ED with a chief complaint of weakness and fatigue associated with black tarry stool. She had a prior colonoscopy many years ago and was told she had a single polyp. She denies vomiting blood except for 1 episode about 2 weeks  ago where she threw up and had a little bit of red blood in it. Stool for Hemoccult is positive in the ED  1. Upper GI bleed with melena can be from gastric ulcers worsened by Coumadin coagulopathy Admit to intensive care unit. Will give Kcentra and 1 dose of vitamin K to reverse coagulopathy Hold Coumadin and other blood thinners Monitor hemoglobin and hematocrit closely Will provide 2 units of blood transfusion and transfuse further as needed basis. GI consult is placed to  Dr. Bluford Kaufmann and notified by the ED physician Protonix drip  2. Symptomatic anemia with exertional dyspnea and fatigue-hemoglobin initially 6.4--> 5.7 2 units of PRBC transition as soon as possible GI consult is placed   3. Coumadin coagulopathy Stop Coumadin  4. End-stage renal disease Patient gets hemodialysis on Tuesday Thursday and Saturday and follows up [facility] [place] [facility] Nephrology consult is placed to Dr. Cherylann Ratel We will monitor her closely for symptoms and  signs of fluid overload, and we will send her for dialysis if needed  5. Hypertension Currently patient is hypotensive Will hold her home medications for hypertension Fluid bolus was given in the ED   GI prophylaxis with Protonix DVT prophylaxis is not needed as the patient's INR is at 2.69       All the records are reviewed and case discussed with ED provider. Management plans discussed with the patient, family and they are in agreement.  CODE STATUS: Full code, healthcare power of attorney is her daughter  TOTAL Critical care TIME TAKING CARE OF THIS PATIENT, reviewing medical records, history and physical, admission orders and coordination of care- 50 minutes.    Ramonita Lab M.D on 11/09/2014 at 3:43 PM  Between 7am to 6pm - Pager - [phone]  After 6pm go to [email or web address] - password [email or web address] [facility]  [facility]  Office  [phone]  CC: Primary care physician; [name], MD

## 2014-11-09 NOTE — Consult Note (Signed)
  Pt seen and examined. Please see C. Peggye Pitt' notes. Pt with melena on Fe. Hypotensive and tachycardic on admission. Gets HD Q Tues, Thurs, and Sat.  Agree with blood transfusions. Make sure pt is on daily PPI.  INR elevated due to coumadin. Hx of IVF filter, which may have been removed this past year. Need to confirm this. Plan EGD on Monday unless condition worsens over the weekend. Will follow. Thanks

## 2014-11-09 NOTE — ED Provider Notes (Signed)
[facility] Emergency Department Provider Note  ____________________________________________  Time seen: Approximately 12:24 PM  I have reviewed the triage vital signs and the nursing notes.   HISTORY  Chief Complaint Hypotension  EM caveat: Complete review of systems and full history and physical limited because of active evidence of acute GI bleeding with hypotension requiring immediate resuscitation.  HPI Ariel Smith is a 69 y.o. female who presents with concerns of weakness, she states also short of breath and having dark black stools and is been increasing in frequency for about the last week. She is seen by nurse today and was noted to have low blood pressure and referred to the ER. She denies being in any pain. No fevers or chills or recent illness. Last dialysis was done yesterday without issue.  She had a prior colonoscopy many years ago and was told she had a single polyp. She denies vomiting blood except for 1 episode about a week ago where she threw up and had a little bit of red blood in it. No chest pain or shortness of breath. No difficulty breathing. None no pain.    Past Medical History  Diagnosis Date  . Osteoarthritis   . Depression   . Frequent headaches   . HTN (hypertension)   . HLD (hyperlipidemia)   . History of colon polyps 2013    colonoscopy ([name])  . 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 Systolic murmur     mild  . GERD (gastroesophageal reflux disease)   . COPD (chronic obstructive pulmonary disease)   . ESRD (end stage renal disease) 08/2011    TThSa, L forearm AV fistula, [name]  . Secondary hyperparathyroidism of renal origin   . Osteopenia 01/2013  . Bronchiectasis 08/2014     suggested by thoracic xray    Patient Active Problem List   Diagnosis Date Noted  . Vitamin D deficiency 11/03/2014  . Memory deficit  11/01/2014  . Right-sided thoracic back pain 08/17/2014  . Advanced care planning/counseling discussion 03/30/2014  . Health maintenance examination 03/30/2014  . Vitamin B12 deficiency 01/27/2014  . Bilateral leg edema 08/02/2013  . Medicare annual wellness visit, subsequent 01/27/2013  . Osteopenia 01/18/2013  . Chronic cough 10/22/2012  . Osteoarthritis   . Depression   . HTN (hypertension)   . HLD (hyperlipidemia)   . History of DVT of lower extremity   . GERD (gastroesophageal reflux disease)   . ESRD (end stage renal disease) on dialysis 08/19/2011    Past Surgical History  Procedure Laterality Date  . Cholecystectomy  2012  . Bunionectomy Left 2003  . Replacement total knee Right 2006  . Shoulder arthroscopy Right 2009  . Colonoscopy  08/2011    colon biopsies, [name]  . Spirometry  04/2011    WNL  . US echocardiography  06/2008    EF >55%  . Esophagogastroduodenoscopy  08/2011    gastric cardia polyp  . Tonsillectomy  1955  . Tubal ligation  1980  . Exteriorization of a continuous ambulatory peritoneal dialysis catheter  01/2013    removal 12/2103 - [name]  . Dexa  01/2013    osteopenia with -2.0 at hip and spine  . Hospitalization  12/2013    recurrent R pleural effusion due to peritoneal fluid translocation s/p rpt thoracentesis with 1.3 L fluid removed, ERSD started on HD this  hospitalization  . US echocardiography  12/2013    EF 55-60%, nl LV sys fxn, mild-mod MR, AS, increased LV posterior wall thickness, mild TR    Current Outpatient Rx  Name  Route  Sig  Dispense  Refill  . acetaminophen (TYLENOL) 500 MG tablet   Oral   Take 1,000 mg by mouth every 6 (six) hours as needed.         Marland Kitchen albuterol (VENTOLIN HFA) 108 (90 BASE) MCG/ACT inhaler   Inhalation   Inhale 2 puffs into the lungs every 6 (six) hours as needed for wheezing.         . baclofen (LIORESAL) 10 MG tablet      TAKE ONE-HALF TABLET BY MOUTH TWICE DAILY AS NEEDED FOR MUSCLE SPASMS    30 tablet   0   . budesonide-formoterol (SYMBICORT) 160-4.5 MCG/ACT inhaler   Inhalation   Inhale 2 puffs into the lungs 2 (two) times daily.         . calcitRIOL (ROCALTROL) 0.25 MCG capsule   Oral   Take 0.25 mcg by mouth daily.         . Calcium Acetate 667 MG TABS   Oral   Take 2 tablets by mouth 3 (three) times daily with meals.         . citalopram (CELEXA) 20 MG tablet   Oral   Take 1 tablet (20 mg total) by mouth daily.   90 tablet   3   . cyanocobalamin (,VITAMIN B-12,) 1000 MCG/ML injection   Intramuscular   Inject 1 mL (1,000 mcg total) into the muscle once. Start 10/2014   1 mL   0   . diphenhydramine-acetaminophen (TYLENOL PM) 25-500 MG TABS   Oral   Take 1 tablet by mouth at bedtime as needed.         . donepezil (ARICEPT) 5 MG tablet   Oral   Take 1 tablet (5 mg total) by mouth at bedtime.   30 tablet   3   . folic acid-vitamin b complex-vitamin c-selenium-zinc (DIALYVITE) 3 MG TABS tablet   Oral   Take 1 tablet by mouth daily.         . hydrOXYzine (ATARAX/VISTARIL) 25 MG tablet      TAKE ONE-HALF TABLET BY MOUTH TWICE DAILY AS NEEDED   30 tablet   0   . lovastatin (MEVACOR) 20 MG tablet      TAKE ONE TABLET BY MOUTH AT BEDTIME   90 tablet   1   . pantoprazole (PROTONIX) 40 MG tablet      TAKE ONE TABLET BY MOUTH ONCE DAILY   30 tablet   6   . promethazine (PHENERGAN) 12.5 MG tablet   Oral   Take 12.5 mg by mouth every 6 (six) hours as needed for nausea or vomiting.         . sevelamer carbonate (RENVELA) 800 MG tablet   Oral   Take 800 mg by mouth as directed. 2 pills with snacks and 3 pills with full meals         . traMADol (ULTRAM) 50 MG tablet      TAKE ONE-HALF TABLET BY MOUTH EVERY 12 HOURS AS NEEDED   30 tablet   0   . traZODone (DESYREL) 50 MG tablet   Oral   Take 0.5-1 tablets (25-50 mg total) by mouth at bedtime.   30 tablet   3   . warfarin (COUMADIN) 2.5 MG tablet   Oral  Take 2.5 mg by mouth  daily.           Allergies Other  Family History  Problem Relation Age of Onset  . Deep vein thrombosis Brother   . Cancer Mother 12    colon  . Cancer Father     prostate  . Hypertension Father   . Hypertension Brother   . Stroke Mother   . Diabetes Neg Hx   . CAD Neg Hx   . Cancer Daughter 55    breast  . Dementia Father 12    Social History History  Substance Use Topics  . Smoking status: Former Smoker -- 2.00 packs/day for 30 years    Start date: 04/20/1978    Quit date: 04/20/2009  . Smokeless tobacco: Never Used  . Alcohol Use: No    Review of Systems Constitutional: No fever/chills Eyes: No visual changes. ENT: No sore throat. Cardiovascular: Denies chest pain. Respiratory: Denies shortness of breath. Gastrointestinal:See history of present illness  Genitourinary:Not applicable  Musculoskeletal: Negative for back pain. Skin: Negative for rash. Neurological: Negative for headaches, focal weakness or numbness.  10-point ROS otherwise negative.  ____________________________________________   PHYSICAL EXAM:  VITAL SIGNS: ED Triage Vitals  Enc Vitals Group     BP 11/09/14 1129 91/52 mmHg     Pulse Rate 11/09/14 1129 92     Resp 11/09/14 1129 20     Temp 11/09/14 1129 97.5 F (36.4 C)     Temp Source 11/09/14 1129 Oral     SpO2 11/09/14 1129 95 %     Weight 11/09/14 1129 217 lb (98.431 kg)     Height 11/09/14 1129 5\' 4"  (1.626 m)     Head Cir --      Peak Flow --      Pain Score --      Pain Loc --      Pain Edu? --      Excl. in GC? --     Constitutional: Alert and oriented. Well appearing and in no acute distress. Eyes: Conjunctivae are pale. PERRL. EOMI. Head: Atraumatic. Nose: No congestion/rhinnorhea. Mouth/Throat: Mucous membranes are moist.  Oropharynx non-erythematous. Neck: No stridor.   Cardiovascular: Normal rate, regular rhythm. Grossly normal heart sounds.  Good peripheral circulation. Respiratory: Normal respiratory  effort.  No retractions. Lungs CTAB. Gastrointestinal: Soft and nontender. No distention. No abdominal bruits. No CVA tenderness. Rectal exam: Notable for black loose heme-positive stool. No grossly red blood, however obvious dark black melanoma with significant positive guaiac. Musculoskeletal: No lower extremity tenderness nor edema.  No joint effusions. Neurologic:  Normal speech and language. No gross focal neurologic deficits are appreciated. No gait instability. Skin:  Skin is warm, dry and intact. No rash noted. Psychiatric: Mood and affect are normal. Speech and behavior are normal.  ____________________________________________   LABS (all labs ordered are listed, but only abnormal results are displayed)  Labs Reviewed  COMPREHENSIVE METABOLIC PANEL - Abnormal; Notable for the following:    Chloride 98 (*)    Glucose, Bld 142 (*)    BUN 59 (*)    Creatinine, Ser 5.49 (*)    Calcium 7.9 (*)    Total Protein 5.9 (*)    Albumin 3.2 (*)    GFR calc non Af Amer 7 (*)    GFR calc Af Amer 8 (*)    All other components within normal limits  CBC - Abnormal; Notable for the following:    RBC 1.82 (*)  Hemoglobin 6.4 (*)    HCT 18.9 (*)    MCV 103.7 (*)    MCH 35.0 (*)    All other components within normal limits  PROTIME-INR - Abnormal; Notable for the following:    Prothrombin Time 28.7 (*)    All other components within normal limits  TYPE AND SCREEN  ABO/RH  PREPARE RBC (CROSSMATCH)   ____________________________________________  EKG  ED ECG REPORT I, QUALE, MARK, the attending physician, personally viewed and interpreted this ECG.  Date: 11/09/2014 EKG Time: 1135 Rate: 90 Rhythm: normal sinus rhythm QRS Axis: normal Intervals: normal ST/T Wave abnormalities: normal Conduction Disutrbances: none Narrative Interpretation:  unremarkable  ____________________________________________  RADIOLOGY   ____________________________________________   PROCEDURES  Procedure(s) performed: None  Critical Care performed: Yes, see critical care note(s)   CRITICAL CARE Performed by: Sharyn Creamer   Total critical care time: 40  Critical care time was exclusive of separately billable procedures and treating other patients.  Critical care was necessary to treat or prevent imminent or life-threatening deterioration.  Critical care was time spent personally by me on the following activities: development of treatment plan with patient and/or surrogate as well as nursing, discussions with consultants, evaluation of patient's response to treatment, examination of patient, obtaining history from patient or surrogate, ordering and performing treatments and interventions, ordering and review of laboratory studies, ordering and review of radiographic studies, pulse oximetry and re-evaluation of patient's condition.  ____________________________________________   INITIAL IMPRESSION / ASSESSMENT AND PLAN / ED COURSE  Pertinent labs & imaging results that were available during my care of the patient were reviewed by me and considered in my medical decision making (see chart for details).    This patient presents with symptoms concerning for active gastrointestinal bleeding with melanoma and one episode of hematemesis recently. Her exam is consistent with acute active gastrointestinal bleeding with associated hypotension requiring fluid resuscitation and immediate transfusion. Based on her ongoing bleeding with a history of remote DVTs, and discussion with the patient and her family as well as nephrology and gastroenterology will initiate reversal. Of course risk of this is that she could caught her fistula, could develop other venous clots, or even arterial thrombus however given the setting of her anemia with associated  hypotension and active bleeding the benefits of reversal appear to outweigh the risk.  Consultations obtained from gastroenterology and nephrology via phone, both of whom will see her during this hospitalization.  D/W Lateef (Renal) re ? Reversal. Given hypotension and probable active ongoing bleeding with hypotension recommends give PPC, but BUN 59 and recent HD would hold on DDAVP.   Discussed with Dr. Bluford Kaufmann of gastroenterology recommends reversal of INR in addition transfusion and will be seen by gastroenterology in consultation today. ____________________________________________  ----------------------------------------- 12:46 PM on 11/09/2014 -----------------------------------------  Risks and benefits of blood transfusion discussed with the patient at bedside by myself. Patient has had previous blood transfusions and she and husband both agreeable with transfusion today understanding the risks of ongoing bleeding. We also discussed the risks including infection, transfusion reaction, stroke, heart attack, etc. and they're agreeable. As the patient's treating physician I feel that reversal and blood transfusion are indicated given her acute symptoms to prevent further deterioration and potential loss of life.  Discussed with the hospitalist service will admit the patient. Anticipate consultation from nephrology and gastroenterology.  FINAL CLINICAL IMPRESSION(S) / ED DIAGNOSES  Final diagnoses:  Gastrointestinal hemorrhage, unspecified gastritis, unspecified gastrointestinal hemorrhage type  Hypotension, unspecified hypotension type  Coagulopathy  Sharyn Creamer, MD 11/09/14 1247

## 2014-11-09 NOTE — Telephone Encounter (Signed)
Pt came in for a B12 inj.  Husband advised [place] her BP was 75/54 at home and pt was dizzy.  Triaged pt and she advise me she was SOB, Dizzy, Shaky, and having Black tar stools.  Pt's vitals: O2 99%, P 103, Temp 97.5, and BP 96/54, I advise Mayra Reel, NP of vitals and she advise me to have pt go to ER. I advise pt and she refused for me to call EMS she said they will drive to ER  I didn't give B12 inj, I had front desk cancel appt

## 2014-11-09 NOTE — ED Notes (Signed)
PT c/o low b/p today while at diaylsis and was not able to have dialysis, 90/60 this morning and 70/50's..states she has had dizziness for the past week.Marland Kitchen

## 2014-11-09 NOTE — Telephone Encounter (Signed)
Yes, the emergency department is the best treatment option for this patient.

## 2014-11-09 NOTE — Progress Notes (Signed)
[facility]  ROUNDING NOTE   Subjective:  Patient well known to [place]. We follow her for end-stage renal disease on hemodialysis Tuesday, Thursday, Saturday. She presents now with melana. She has some associated shortness of breath. She has been maintained on Coumadin. INR was found to be 2.69. She is actively receiving PRBC transfusion.   Objective:  Vital signs in last 24 hours:  Temp:  [97.5 F (36.4 C)-98.3 F (36.8 C)] 98 F (36.7 C) (07/22 1515) Pulse Rate:  [81-95] 95 (07/22 1515) Resp:  [10-21] 21 (07/22 1515) BP: (76-118)/(35-70) 109/70 mmHg (07/22 1530) SpO2:  [95 %-100 %] 100 % (07/22 1515) Weight:  [98.431 kg (217 lb)] 98.431 kg (217 lb) (07/22 1129)  Weight change:  Filed Weights   11/09/14 1129  Weight: 98.431 kg (217 lb)    Intake/Output:     Intake/Output this shift:  Total I/O In: 430 [Blood:330; IV Piggyback:100] Out: -   Physical Exam: General: NAD, laying in bed  Head: Normocephalic, atraumatic. Moist oral mucosal membranes  Eyes: Anicteric  Neck: Supple, trachea midline  Lungs:  Clear to auscultation normal effort  Heart: Regular rate and rhythm  Abdomen:  Soft, nontender, BS present  Extremities:  trace peripheral edema.  Neurologic: Nonfocal, moving all four extremities  Skin: No lesions  Access: LUE AVF    Basic Metabolic Panel:  Recent Labs Lab 11/09/14 1132  NA 138  K 4.2  CL 98*  CO2 27  GLUCOSE 142*  BUN 59*  CREATININE 5.49*  CALCIUM 7.9*    Liver Function Tests:  Recent Labs Lab 11/09/14 1132  AST 25  ALT 18  ALKPHOS 58  BILITOT 0.4  PROT 5.9*  ALBUMIN 3.2*   No results for input(s): LIPASE, AMYLASE in the last 168 hours. No results for input(s): AMMONIA in the last 168 hours.  CBC:  Recent Labs Lab 11/09/14 1132 11/09/14 1430  WBC 6.6  --   HGB 6.4* 5.7*  HCT 18.9* 16.9*  MCV 103.7*  --   PLT 196  --     Cardiac Enzymes: No results for input(s): CKTOTAL, CKMB, CKMBINDEX, TROPONINI  in the last 168 hours.  BNP: Invalid input(s): POCBNP  CBG: No results for input(s): GLUCAP in the last 168 hours.  Microbiology: Results for orders placed or performed in visit on 01/08/14  Body fluid culture     Status: None   Collection Time: 01/08/14  9:12 AM  Result Value Ref Range Status   Micro Text Report   Final       SOURCE: right pleural fluid    COMMENT                   NO GROWTH AEROBICALLY/ANAEROBICALLY IN 4 DAYS   GRAM STAIN                RARE WHITE BLOOD CELLS   GRAM STAIN                NO RED BLOOD CELLS   GRAM STAIN                NO ORGANISMS SEEN   ANTIBIOTIC                                                      Culture, fungus without smear  Status: None   Collection Time: 01/08/14  9:12 AM  Result Value Ref Range Status   Micro Text Report   Final       SOURCE: RIGHT PLEURAL FLUID    COMMENT                   NO YEAST OR FUNGUS ISOLATED IN 21 DAYS   ANTIBIOTIC                                                        Coagulation Studies:  Recent Labs  11/09/14 1132 11/09/14 1430  LABPROT 28.7* 29.0*  INR 2.69 2.73    Urinalysis: No results for input(s): COLORURINE, LABSPEC, PHURINE, GLUCOSEU, HGBUR, BILIRUBINUR, KETONESUR, PROTEINUR, UROBILINOGEN, NITRITE, LEUKOCYTESUR in the last 72 hours.  Invalid input(s): APPERANCEUR    Imaging: No results found.   Medications:   . pantoprozole (PROTONIX) infusion 8 mg/hr (11/09/14 1630)   . budesonide-formoterol  2 puff Inhalation BID  . donepezil  5 mg Oral QHS  . multivitamin with minerals  1 tablet Oral Daily  . [START ON 11/13/2014] pantoprazole (PROTONIX) IV  40 mg Intravenous Q12H  . phytonadione (VITAMIN K) IV  10 mg Intravenous STAT  . sevelamer carbonate  2,400 mg Oral TID WC  . traZODone  25-50 mg Oral QHS   albuterol, hydrOXYzine, sevelamer carbonate, traMADol  Assessment/ Plan:  69 y.o. female with past medical history of ESRD on HD TTHS, SHPTH, AOCD, HTN,  hyperlipidemia, COPD, DVT with PE, tx with long term coumadin and IVC filter placement, L knee TKA, R shoulder surgery  1. End-stage renal disease on hemodialysis Tuesday, Thursday, Saturday. The patient had dialysis treatment yesterday. However she is receiving PRBC transfusions today. Therefore we will proceed with a short dialysis treatment today of 2.5 hours with ultrafiltration target of 1 kg. We will plan for dialysis tomorrow as well.  2. Anemia of chronic kidney disease/anemia of blood loss. The patient has been having black tarry stools. She is receiving PRBC transfusion at present. Continue to follow CBC and follow-up gastroenterology recommendations. Coumadin on hold.  3. Secondary hyperparathyroidism. Check phosphorus with dialysis today as well as PTH.  4. Acute gastrointestinal hemorrhage K92.2:  Upper GI bleed noted, on protonix at present, receiving vitamin K as well, monitor hgb, await further GI input.    LOS: 0 Man Effertz 7/22/20165:34 PM

## 2014-11-10 ENCOUNTER — Inpatient Hospital Stay: Payer: Medicare Other

## 2014-11-10 LAB — CBC
HEMATOCRIT: 21.1 % — AB (ref 35.0–47.0)
Hemoglobin: 7.1 g/dL — ABNORMAL LOW (ref 12.0–16.0)
MCH: 32.1 pg (ref 26.0–34.0)
MCHC: 33.7 g/dL (ref 32.0–36.0)
MCV: 95.3 fL (ref 80.0–100.0)
Platelets: 136 10*3/uL — ABNORMAL LOW (ref 150–440)
RBC: 2.21 MIL/uL — AB (ref 3.80–5.20)
RDW: 18 % — ABNORMAL HIGH (ref 11.5–14.5)
WBC: 4.9 10*3/uL (ref 3.6–11.0)

## 2014-11-10 LAB — HEMOGLOBIN AND HEMATOCRIT, BLOOD
HCT: 21.3 % — ABNORMAL LOW (ref 35.0–47.0)
HCT: 20.1 % — ABNORMAL LOW (ref 35.0–47.0)
Hemoglobin: 7.4 g/dL — ABNORMAL LOW (ref 12.0–16.0)
Hemoglobin: 6.8 g/dL — ABNORMAL LOW (ref 12.0–16.0)

## 2014-11-10 LAB — PROTIME-INR
INR: 1.2
INR: 1.22
INR: 1.15
PROTHROMBIN TIME: 14.9 s (ref 11.4–15.0)
Prothrombin Time: 15.4 seconds — ABNORMAL HIGH (ref 11.4–15.0)
Prothrombin Time: 15.6 seconds — ABNORMAL HIGH (ref 11.4–15.0)

## 2014-11-10 LAB — BASIC METABOLIC PANEL
Anion gap: 9 (ref 5–15)
BUN: 41 mg/dL — AB (ref 6–20)
CALCIUM: 7.6 mg/dL — AB (ref 8.9–10.3)
CO2: 30 mmol/L (ref 22–32)
Chloride: 101 mmol/L (ref 101–111)
Creatinine, Ser: 4.27 mg/dL — ABNORMAL HIGH (ref 0.44–1.00)
GFR calc Af Amer: 11 mL/min — ABNORMAL LOW (ref >60)
GFR, EST NON AFRICAN AMERICAN: 10 mL/min — AB (ref >60)
Glucose, Bld: 83 mg/dL (ref 65–99)
Potassium: 4.1 mmol/L (ref 3.5–5.1)
SODIUM: 140 mmol/L (ref 135–145)

## 2014-11-10 LAB — APTT: APTT: 32 s (ref 24–36)

## 2014-11-10 MED ORDER — SODIUM CHLORIDE 0.9 % IV SOLN
INTRAVENOUS | Status: DC
  Administered 2014-11-10 – 2014-11-11 (×2): via INTRAVENOUS

## 2014-11-10 MED ORDER — ZOLPIDEM TARTRATE 5 MG PO TABS
5.0000 mg | ORAL_TABLET | Freq: Every evening | ORAL | Status: DC | PRN
  Administered 2014-11-10 – 2014-11-20 (×8): 5 mg via ORAL
  Filled 2014-11-10 (×8): qty 1

## 2014-11-10 MED ORDER — SODIUM CHLORIDE 0.9 % IV SOLN
Freq: Once | INTRAVENOUS | Status: AC
  Administered 2014-11-10: 19:00:00 via INTRAVENOUS

## 2014-11-10 MED ORDER — SODIUM CHLORIDE 0.9 % IV BOLUS (SEPSIS)
250.0000 mL | Freq: Once | INTRAVENOUS | Status: AC
  Administered 2014-11-10: 250 mL via INTRAVENOUS

## 2014-11-10 NOTE — Progress Notes (Signed)
Patient's BP started to drop during dialysis and is now low with MAP in low 50s.  Spoke to Dr. Sheryle Hail regarding patient's BP.  Per MD, give bolus now and start NS at 69ml/hr to help raise BP, since patient will have her regular dialysis later today.  Will continue to monitor.

## 2014-11-10 NOTE — Progress Notes (Signed)
Pt hgb 6.8 from 7.4. S/w dr Bluford Kaufmann, orders to transfuse 1 unit blood. Repeat hgb 0500 11/11/14. No additional units on hold at this time. If hgb drops below 7, call dr. Bluford Kaufmann.

## 2014-11-10 NOTE — Progress Notes (Signed)
Notified Dr. Letitia Libra about the results of Ariel Smith's bilateral lower extremity ultrasound. Dr. Letitia Libra said he would review the Ariel Smith's chart and gave a verbal order to go ahead and put SCDs on the Ariel Smith. Will continue to monitor

## 2014-11-10 NOTE — Progress Notes (Signed)
ANTICOAGULATION CONSULT NOTE - Initial Consult  Pharmacy Consult for Kcentra  Indication: Upper GIB  Allergies  Allergen Reactions  . Other Other (See Comments)    Pt states that she is allergic to Midrin and it causes headaches.     Patient Measurements: Height:  (162.6 cm) Weight: 217 lb (98.431 kg) IBW/kg (Calculated) : 54.7   Vital Signs: Temp: 98.2 F (36.8 C) (07/23 0800) Temp Source: Oral (07/23 0800) BP: 106/57 mmHg (07/23 0900) Pulse Rate: 101 (07/23 0900)  Labs:  Recent Labs  11/09/14 1132  11/09/14 2108 11/10/14 0538 11/10/14 0941  HGB 6.4*  < > 7.6* 7.1* 7.4*  HCT 18.9*  < > 21.9* 21.1* 21.3*  PLT 196  --   --  136*  --   APTT  --   --   --  32  --   LABPROT 28.7*  < > 16.0* 15.4* 15.6*  INR 2.69  < > 1.26 1.20 1.22  CREATININE 5.49*  --   --  4.27*  --   < > = values in this interval not displayed.  Estimated Creatinine Clearance: 14.4 mL/min (by C-G formula based on Cr of 4.27).   Medical History: Past Medical History  Diagnosis Date  . Osteoarthritis   . Depression   . Frequent headaches   . HTN (hypertension)   . HLD (hyperlipidemia)   . History of colon polyps 2013    colonoscopy ([name])  . 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 Systolic murmur     mild  . GERD (gastroesophageal reflux disease)   . COPD (chronic obstructive pulmonary disease)   . ESRD (end stage renal disease) 08/2011    TThSa, L forearm AV fistula, [name]  . Secondary hyperparathyroidism of renal origin   . Osteopenia 01/2013  . Bronchiectasis 08/2014     suggested by thoracic xray    Medications:  Prescriptions prior to admission  Medication Sig Dispense Refill Last Dose  . acetaminophen (TYLENOL) 500 MG tablet Take 1,000 mg by mouth every 6 (six) hours as needed for mild pain or headache.    11/08/2014 at [facility]  . albuterol (VENTOLIN HFA) 108 (90 BASE) MCG/ACT  inhaler Inhale 2 puffs into the lungs every 6 (six) hours as needed for wheezing or shortness of breath.    11/09/2014 at Unknown time  . baclofen (LIORESAL) 10 MG tablet Take 5-10 mg by mouth 2 (two) times daily as needed for muscle spasms.   Past Week at Unknown time  . budesonide-formoterol (SYMBICORT) 160-4.5 MCG/ACT inhaler Inhale 2 puffs into the lungs 2 (two) times daily.   11/09/2014 at Unknown time  . calcitRIOL (ROCALTROL) 0.25 MCG capsule Take 0.25 mcg by mouth daily.   11/09/2014 at Unknown time  . calcium acetate (PHOSLO) 667 MG capsule Take 2,001 mg by mouth 3 (three) times daily with meals.    11/09/2014 at Unknown time  . cinacalcet (SENSIPAR) 60 MG tablet Take 60 mg by mouth daily.   11/08/2014 at Unknown time  . citalopram (CELEXA) 20 MG tablet Take 1 tablet (20 mg total) by mouth daily. 90 tablet 3 11/09/2014 at Unknown time  . donepezil (ARICEPT) 5 MG tablet Take 1 tablet (5 mg total) by mouth at bedtime. (Patient taking differently: Take 2.5 mg by mouth at bedtime. ) 30 tablet 3 11/08/2014 at Unknown time  . hydrOXYzine (ATARAX/VISTARIL) 25  MG tablet Take 12.5-25 mg by mouth 2 (two) times daily as needed for anxiety.   PRN at PRN  . lovastatin (MEVACOR) 20 MG tablet Take 20 mg by mouth at bedtime.   11/08/2014 at Unknown time  . pantoprazole (PROTONIX) 40 MG tablet Take 40 mg by mouth daily as needed (for heartburn/indigestion).   PRN at PRN  . promethazine (PHENERGAN) 12.5 MG tablet Take 12.5 mg by mouth every 6 (six) hours as needed for nausea or vomiting.   PRN at PRN  . traMADol (ULTRAM) 50 MG tablet Take 25 mg by mouth every 12 (twelve) hours as needed for moderate pain.   11/08/2014 at [facility]  . traZODone (DESYREL) 50 MG tablet Take 0.5-1 tablets (25-50 mg total) by mouth at bedtime. (Patient taking differently: Take 25 mg by mouth at bedtime. ) 30 tablet 3 11/08/2014 at Unknown time  . warfarin (COUMADIN) 2.5 MG tablet Take 2.5 mg by mouth at bedtime.    11/08/2014 at Unknown time  .  cyanocobalamin (,VITAMIN B-12,) 1000 MCG/ML injection Inject 1,000 mcg into the muscle every 30 (thirty) days. Start 10/2014 1 mL 0 unknown at unknown   Scheduled:  . budesonide-formoterol  2 puff Inhalation BID  . donepezil  5 mg Oral QHS  . multivitamin with minerals  1 tablet Oral Daily  . [START ON 11/13/2014] pantoprazole (PROTONIX) IV  40 mg Intravenous Q12H  . sevelamer carbonate  2,400 mg Oral TID WC  . traZODone  25-50 mg Oral QHS   Infusions:  . sodium chloride 75 mL/hr at 11/10/14 0700  . pantoprozole (PROTONIX) infusion 8 mg/hr (11/10/14 0700)   PRN: albuterol, benzonatate, hydrOXYzine, menthol-cetylpyridinium, sevelamer carbonate, traMADol  Assessment: Patient on warfarin PTA admitted with UGIB ordered Kcentra and vitamin K. KCentra 2500 units given along with vitamin k 10 mg IV x1 on 7/22.   Most recent INR 1.22 (7/23 at 0941)   Goal of Therapy:  Reversal of anticoagulatiion.    Plan:  Will continue current orders for  INR 1 hour after infusion then q 6 hours x 4 and daily x 2 thereafter (q6h x2 and daily x2 INR orders left).     Marty Heck 11/10/2014,10:06 AM

## 2014-11-10 NOTE — Progress Notes (Signed)
[facility]  ROUNDING NOTE   Subjective:  Pt had HD yesterday. BP was a bit low towards the end of treatment. Due for HD again today.  Objective:  Vital signs in last 24 hours:  Temp:  [97.9 F (36.6 C)-98.8 F (37.1 C)] 98.2 F (36.8 C) (07/23 0800) Pulse Rate:  [79-101] 100 (07/23 1400) Resp:  [9-29] 18 (07/23 1400) BP: (74-119)/(37-90) 100/52 mmHg (07/23 1400) SpO2:  [92 %-100 %] 98 % (07/23 1400)  Weight change:  Filed Weights   11/09/14 1129  Weight: 98.431 kg (217 lb)    Intake/Output: I/O last 3 completed shifts: In: 1797.5 [I.V.:737.5; Blood:660; IV Piggyback:400] Out: 479 [Other:479]   Intake/Output this shift:  Total I/O In: 1000 [P.O.:300; I.V.:700] Out: -   Physical Exam: General: NAD, laying in bed  Head: Normocephalic, atraumatic. Moist oral mucosal membranes  Eyes: Anicteric  Neck: Supple, trachea midline  Lungs:  Clear to auscultation normal effort  Heart: Regular rate and rhythm  Abdomen:  Soft, nontender, BS present  Extremities:  trace peripheral edema.  Neurologic: Nonfocal, moving all four extremities  Skin: No lesions  Access: LUE AVF    Basic Metabolic Panel:  Recent Labs Lab 11/09/14 1132 11/10/14 0538  NA 138 140  K 4.2 4.1  CL 98* 101  CO2 27 30  GLUCOSE 142* 83  BUN 59* 41*  CREATININE 5.49* 4.27*  CALCIUM 7.9* 7.6*    Liver Function Tests:  Recent Labs Lab 11/09/14 1132  AST 25  ALT 18  ALKPHOS 58  BILITOT 0.4  PROT 5.9*  ALBUMIN 3.2*   No results for input(s): LIPASE, AMYLASE in the last 168 hours. No results for input(s): AMMONIA in the last 168 hours.  CBC:  Recent Labs Lab 11/09/14 1132 11/09/14 1430 11/09/14 2108 11/10/14 0538 11/10/14 0941  WBC 6.6  --   --  4.9  --   HGB 6.4* 5.7* 7.6* 7.1* 7.4*  HCT 18.9* 16.9* 21.9* 21.1* 21.3*  MCV 103.7*  --   --  95.3  --   PLT 196  --   --  136*  --     Cardiac Enzymes: No results for input(s): CKTOTAL, CKMB, CKMBINDEX, TROPONINI in  the last 168 hours.  BNP: Invalid input(s): POCBNP  CBG: No results for input(s): GLUCAP in the last 168 hours.  Microbiology: Results for orders placed or performed during the hospital encounter of 11/09/14  MRSA PCR Screening     Status: None   Collection Time: 11/09/14  9:40 PM  Result Value Ref Range Status   MRSA by PCR NEGATIVE NEGATIVE Final    Comment:        The GeneXpert MRSA Assay (FDA approved for NASAL specimens only), is one component of a comprehensive MRSA colonization surveillance program. It is not intended to diagnose MRSA infection nor to guide or monitor treatment for MRSA infections.     Coagulation Studies:  Recent Labs  11/09/14 1132 11/09/14 1430 11/09/14 2108 11/10/14 0538 11/10/14 0941  LABPROT 28.7* 29.0* 16.0* 15.4* 15.6*  INR 2.69 2.73 1.26 1.20 1.22    Urinalysis: No results for input(s): COLORURINE, LABSPEC, PHURINE, GLUCOSEU, HGBUR, BILIRUBINUR, KETONESUR, PROTEINUR, UROBILINOGEN, NITRITE, LEUKOCYTESUR in the last 72 hours.  Invalid input(s): APPERANCEUR    Imaging: No results found.   Medications:   . sodium chloride 75 mL/hr at 11/10/14 0700  . pantoprozole (PROTONIX) infusion 8 mg/hr (11/10/14 0700)   . budesonide-formoterol  2 puff Inhalation BID  . donepezil  5  mg Oral QHS  . multivitamin with minerals  1 tablet Oral Daily  . sevelamer carbonate  2,400 mg Oral TID WC  . traZODone  25-50 mg Oral QHS   albuterol, benzonatate, hydrOXYzine, menthol-cetylpyridinium, sevelamer carbonate, traMADol  Assessment/ Plan:  69 y.o. female with past medical history of ESRD on HD TTHS, SHPTH, AOCD, HTN, hyperlipidemia, COPD, DVT with PE, tx with long term coumadin and IVC filter placement, L knee TKA, R shoulder surgery  1. End-stage renal disease on hemodialysis Tuesday, Thursday, Saturday. Pt had HD yesterday as she was having blood transfusions.  Will plan to perform HD today as today is his normal day.  2. Anemia of chronic  kidney disease/anemia of blood loss. The patien was having black tarry stools at home. -hgb currently 7.4 post transfusion.  Would continue to monitor CBC.  3. Secondary hyperparathyroidism. Continue renvela  po tid/wm.  4. Acute gastrointestinal hemorrhage K92.2:  Upper GI bleed noted, continue protonix gtt, EGD in AM, NPO after MN.    LOS: 1 Ariel Smith 7/23/20162:28 PM

## 2014-11-10 NOTE — Consult Note (Signed)
  GI Inpatient Follow-up Note  Patient Identification: Ariel Smith is a 69 y.o. female  Subjective: Significant hypotension near end of HD last night. Supposed to receive full HD today, but daughter concerned about effects on BP. HGb still 7.1 after 2 units PRBC.  Coumadin reversed. INR down to 1.2.   Scheduled Inpatient Medications:  . budesonide-formoterol  2 puff Inhalation BID  . donepezil  5 mg Oral QHS  . multivitamin with minerals  1 tablet Oral Daily  . [START ON 11/13/2014] pantoprazole (PROTONIX) IV  40 mg Intravenous Q12H  . sevelamer carbonate  2,400 mg Oral TID WC  . traZODone  25-50 mg Oral QHS    Continuous Inpatient Infusions:   . sodium chloride 75 mL/hr at 11/10/14 0700  . pantoprozole (PROTONIX) infusion 8 mg/hr (11/10/14 0700)    PRN Inpatient Medications:  albuterol, benzonatate, hydrOXYzine, menthol-cetylpyridinium, sevelamer carbonate, traMADol  Review of Systems: Constitutional: Weight is stable.  Eyes: No changes in vision. ENT: No oral lesions, sore throat.  GI: see HPI.  Heme/Lymph: No easy bruising.  CV: No chest pain.  GU: No hematuria.  Integumentary: No rashes.  Neuro: No headaches.  Psych: No depression/anxiety.  Endocrine: No heat/cold intolerance.  Allergic/Immunologic: No urticaria.  Resp: No cough, SOB.  Musculoskeletal: No joint swelling.    Physical Examination: BP 106/57 mmHg  Pulse 101  Temp(Src) 98.2 F (36.8 C) (Oral)  Resp 19  Ht 5\' 4"  (1.626 m)  Wt 98.431 kg (217 lb)  BMI 37.23 kg/m2  SpO2 100% Gen: NAD, alert and oriented x 4 HEENT: PEERLA, EOMI, Neck: supple, no JVD or thyromegaly Chest: CTA bilaterally, no wheezes, crackles, or other adventitious sounds CV: RRR, no m/g/c/r Abd: soft, NT, ND, +BS in all four quadrants; no HSM, guarding, ridigity, or rebound tenderness Ext: no edema, well perfused with 2+ pulses, Skin: no rash or lesions noted Lymph: no LAD  Data: Lab Results  Component Value Date   WBC  4.9 11/10/2014   HGB 7.4* 11/10/2014   HCT 21.3* 11/10/2014   MCV 95.3 11/10/2014   PLT 136* 11/10/2014    Recent Labs Lab 11/09/14 2108 11/10/14 0538 11/10/14 0941  HGB 7.6* 7.1* 7.4*   Lab Results  Component Value Date   NA 140 11/10/2014   K 4.1 11/10/2014   CL 101 11/10/2014   CO2 30 11/10/2014   BUN 41* 11/10/2014   CREATININE 4.27* 11/10/2014   Lab Results  Component Value Date   ALT 18 11/09/2014   AST 25 11/09/2014   ALKPHOS 58 11/09/2014   BILITOT 0.4 11/09/2014    Recent Labs Lab 11/10/14 0538  APTT 32  INR 1.20   Assessment/Plan: [name] is a 69 y.o. female with likely UGI bleeding.  Coumadin reversed.  Recommendations: For HD today? Plan EGD in AM. Full liquid diet but NPO after MN. thanks Please call with questions or concerns.  Emiyah Spraggins, Ezzard Standing, MD

## 2014-11-10 NOTE — Progress Notes (Signed)
[facility] cover note:   Called by nurse concerning results of lower extremity dopplers. Report shows incomplete obstruction of the proximal left femoral vein. Patient cannot be anticoagulated due to GI bleeding. Has TEDs on. Will add SCDs for now. Will depend on results of endoscopy if patient can be re- anticoagulated. If not may need to consider IVC filter.

## 2014-11-10 NOTE — Progress Notes (Signed)
Patient ID: Ariel Smith, female   DOB: February 09, 1946, 69 y.o.   MRN: [id number] [facility] PROGRESS NOTE  PCP: [name], MD  HPI/Subjective: Patient feeling okay. Has noticed dark stools for about a week. Did vomit today but no blood. As per nurse she got choked on a clear liquid diet.  Objective: Filed Vitals:   11/10/14 1200  BP: 100/47  Pulse: 97  Temp:   Resp: 15    Intake/Output Summary (Last 24 hours) at 11/10/14 1324 Last data filed at 11/10/14 1300  Gross per 24 hour  Intake 2597.5 ml  Output    479 ml  Net 2118.5 ml   Filed Weights   11/09/14 1129  Weight: 98.431 kg (217 lb)    ROS: Review of Systems  Constitutional: Negative for fever and chills.  Eyes: Negative for blurred vision.  Respiratory: Positive for shortness of breath. Negative for cough.   Cardiovascular: Negative for chest pain.  Gastrointestinal: Positive for diarrhea and blood in stool. Negative for nausea, vomiting, abdominal pain and constipation.  Genitourinary: Negative for dysuria.  Musculoskeletal: Negative for joint pain.  Neurological: Negative for dizziness and headaches.   Exam: Physical Exam  Constitutional: She is oriented to person, place, and time.  HENT:  Nose: No mucosal edema.  Mouth/Throat: No oropharyngeal exudate or posterior oropharyngeal edema.  Eyes: EOM and lids are normal. Pupils are equal, round, and reactive to light.  Conjunctiva pale.  Neck: No JVD present. Carotid bruit is not present. No edema present. No thyroid mass and no thyromegaly present.  Cardiovascular: S1 normal and S2 normal.  Exam reveals no gallop.   Murmur heard.  Systolic murmur is present with a grade of 2/6  Pulses:      Dorsalis pedis pulses are 2+ on the right side, and 2+ on the left side.  Respiratory: No respiratory distress. She has no wheezes. She has no rhonchi. She has no rales.  GI: Soft. Bowel sounds are normal. There is no tenderness.  Musculoskeletal:     Right ankle: She exhibits swelling.       Left ankle: She exhibits swelling.  Lymphadenopathy:    She has no cervical adenopathy.  Neurological: She is alert and oriented to person, place, and time. No cranial nerve deficit.  Skin: Skin is warm. No rash noted. Nails show no clubbing.  Chronic lower extremity discoloration bilateral.  Psychiatric: She has a normal mood and affect.    Data Reviewed: Basic Metabolic Panel:  Recent Labs Lab 11/09/14 1132 11/10/14 0538  NA 138 140  K 4.2 4.1  CL 98* 101  CO2 27 30  GLUCOSE 142* 83  BUN 59* 41*  CREATININE 5.49* 4.27*  CALCIUM 7.9* 7.6*   Liver Function Tests:  Recent Labs Lab 11/09/14 1132  AST 25  ALT 18  ALKPHOS 58  BILITOT 0.4  PROT 5.9*  ALBUMIN 3.2*  CBC:  Recent Labs Lab 11/09/14 1132 11/09/14 1430 11/09/14 2108 11/10/14 0538 11/10/14 0941  WBC 6.6  --   --  4.9  --   HGB 6.4* 5.7* 7.6* 7.1* 7.4*  HCT 18.9* 16.9* 21.9* 21.1* 21.3*  MCV 103.7*  --   --  95.3  --   PLT 196  --   --  136*  --     Scheduled Meds: . budesonide-formoterol  2 puff Inhalation BID  . donepezil  5 mg Oral QHS  . multivitamin with minerals  1 tablet Oral Daily  . sevelamer carbonate  2,400 mg Oral TID WC  . traZODone  25-50 mg Oral QHS   Continuous Infusions: . sodium chloride 75 mL/hr at 11/10/14 0700  . pantoprozole (PROTONIX) infusion 8 mg/hr (11/10/14 0700)    Assessment/Plan:  1. Hypovolemic shock- continue IV fluid hydration and transfuse as needed for hemoglobin less than 7. 2. Acute hemorrhagic anemia- patient transfused packed red blood cells and hemoglobin on the last 3 blood draws have been above 7. Transfuse if any further active bleeding or hemoglobin less than 7. 3. Upper GI bleed with melanoma- patient is on Protonix drip. GI possibly to do an endoscopy tomorrow 4. Chronic respiratory failure and COPD- patient's respiratory status stable on 2 L of oxygen and continue Symbicort. 5. End-stage renal disease  on hemodialysis- as per nephrology. 6. History of DVT and pulmonary embolism. Coagulopathy reversed. I will get an ultrasound of the lower extremities to assess whether there is a DVT or not. Holding Coumadin at this time with active GI bleed.  Code Status:     Code Status Orders        Start     Ordered   11/09/14 1432  Full code   Continuous     11/09/14 1431    Advance Directive Documentation        Most Recent Value   Type of Advance Directive  Living will   Pre-existing out of facility DNR order (yellow form or pink MOST form)     "MOST" Form in Place?        Disposition Plan: Home once stable.  Consultants:  Gastrointestinal  Time spent: 30 minutes of critical care time, continue to monitor in the critical care unit secondary to relative hypotension and GI bleed.  Alford Highland  [facility]

## 2014-11-11 ENCOUNTER — Encounter
Admission: EM | Disposition: A | Discharge status: Skilled Nursing Facility | Payer: Self-pay | Source: Home / Self Care | Attending: Internal Medicine

## 2014-11-11 ENCOUNTER — Encounter: Payer: Self-pay | Admitting: Gastroenterology

## 2014-11-11 ENCOUNTER — Inpatient Hospital Stay: Payer: Medicare Other | Admitting: Anesthesiology

## 2014-11-11 HISTORY — PX: ESOPHAGOGASTRODUODENOSCOPY: SHX5428

## 2014-11-11 LAB — PROTIME-INR
INR: 1.09
INR: 1.07
Prothrombin Time: 14.3 seconds (ref 11.4–15.0)
Prothrombin Time: 14.1 seconds (ref 11.4–15.0)

## 2014-11-11 LAB — CBC
HEMATOCRIT: 23.9 % — AB (ref 35.0–47.0)
Hemoglobin: 8.1 g/dL — ABNORMAL LOW (ref 12.0–16.0)
MCH: 32.6 pg (ref 26.0–34.0)
MCHC: 33.7 g/dL (ref 32.0–36.0)
MCV: 96.5 fL (ref 80.0–100.0)
Platelets: 132 10*3/uL — ABNORMAL LOW (ref 150–440)
RBC: 2.47 MIL/uL — ABNORMAL LOW (ref 3.80–5.20)
RDW: 17.1 % — ABNORMAL HIGH (ref 11.5–14.5)
WBC: 6 10*3/uL (ref 3.6–11.0)

## 2014-11-11 LAB — HEPATITIS B SURFACE ANTIBODY, QUANTITATIVE: Hepatitis B-Post: 10.4 m[IU]/mL (ref >9.9)

## 2014-11-11 LAB — PARATHYROID HORMONE, INTACT (NO CA): PTH: 142 pg/mL — ABNORMAL HIGH (ref 15–65)

## 2014-11-11 LAB — HEPATITIS B SURFACE ANTIGEN: Hepatitis B Surface Ag: NEGATIVE

## 2014-11-11 SURGERY — EGD (ESOPHAGOGASTRODUODENOSCOPY)
Anesthesia: General | Laterality: Left

## 2014-11-11 MED ORDER — PROPOFOL 10 MG/ML IV BOLUS
INTRAVENOUS | Status: DC | PRN
  Administered 2014-11-11 (×2): 40 mg via INTRAVENOUS
  Administered 2014-11-11: 60 mg via INTRAVENOUS

## 2014-11-11 MED ORDER — TRAMADOL HCL 50 MG PO TABS
25.0000 mg | ORAL_TABLET | Freq: Two times a day (BID) | ORAL | Status: DC | PRN
  Administered 2014-11-14: 25 mg via ORAL
  Filled 2014-11-11: qty 1

## 2014-11-11 MED ORDER — LIDOCAINE HCL (CARDIAC) 10 MG/ML IV SOLN
INTRAVENOUS | Status: DC | PRN
  Administered 2014-11-11: 50 mg via INTRAVENOUS

## 2014-11-11 MED ORDER — PANTOPRAZOLE SODIUM 40 MG PO TBEC
40.0000 mg | DELAYED_RELEASE_TABLET | Freq: Every day | ORAL | Status: DC | PRN
  Administered 2014-11-12: 40 mg via ORAL
  Filled 2014-11-11 (×2): qty 1

## 2014-11-11 MED ORDER — IPRATROPIUM-ALBUTEROL 0.5-2.5 (3) MG/3ML IN SOLN
RESPIRATORY_TRACT | Status: AC
  Administered 2014-11-11: 3 mL via RESPIRATORY_TRACT
  Filled 2014-11-11: qty 3

## 2014-11-11 MED ORDER — HYDROCOD POLST-CPM POLST ER 10-8 MG/5ML PO SUER
5.0000 mL | Freq: Two times a day (BID) | ORAL | Status: DC | PRN
  Administered 2014-11-11 – 2014-11-12 (×2): 5 mL via ORAL
  Filled 2014-11-11 (×2): qty 5

## 2014-11-11 MED ORDER — IPRATROPIUM-ALBUTEROL 0.5-2.5 (3) MG/3ML IN SOLN
3.0000 mL | Freq: Four times a day (QID) | RESPIRATORY_TRACT | Status: DC
  Administered 2014-11-11: 3 mL via RESPIRATORY_TRACT

## 2014-11-11 MED ORDER — MIDAZOLAM HCL 2 MG/2ML IJ SOLN: 1.0000 mg | Freq: Once | INTRAMUSCULAR | Status: DC

## 2014-11-11 NOTE — Op Note (Addendum)
EGD showed no active bleeding. Mild erythema/irritation in cardia. No other bleeding site. Continue PPI for now. Last colon in 2013. Cannot find actual report. Continue to moniter hgb. If hgb keeps falling, may have to consider colonsocopy as well. However, her breathing is marginal at best with sedation. Please get colonoscopy report from Dr. Rutherford Nail office. Will follow. Thanks.

## 2014-11-11 NOTE — Progress Notes (Signed)
ANTICOAGULATION CONSULT NOTE -follow up Consult  Pharmacy Consult for Kcentra  Indication: Upper GIB  Allergies  Allergen Reactions  . Other Other (See Comments)    Pt states that she is allergic to Midrin and it causes headaches.     Patient Measurements: Height: 5\' 4"  (162.6 cm) Weight: 217 lb (98.431 kg) IBW/kg (Calculated) : 54.7   Vital Signs: Temp: 98.5 F (36.9 C) (07/23 2124) Temp Source: Oral (07/23 2124) BP: 123/53 mmHg (07/24 0200) Pulse Rate: 113 (07/24 0200)  Labs:  Recent Labs  11/09/14 1132  11/10/14 0538 11/10/14 0941 11/10/14 1542 11/10/14 2242  HGB 6.4*  < > 7.1* 7.4* 6.8*  --   HCT 18.9*  < > 21.1* 21.3* 20.1*  --   PLT 196  --  136*  --   --   --   APTT  --   --  32  --   --   --   LABPROT 28.7*  < > 15.4* 15.6* 14.9 14.3  INR 2.69  < > 1.20 1.22 1.15 1.09  CREATININE 5.49*  --  4.27*  --   --   --   < > = values in this interval not displayed.  Estimated Creatinine Clearance: 14.4 mL/min (by C-G formula based on Cr of 4.27).   Medical History: Past Medical History  Diagnosis Date  . Osteoarthritis   . Depression   . Frequent headaches   . HTN (hypertension)   . HLD (hyperlipidemia)   . History of colon polyps 2013    colonoscopy ([name])  . 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 Systolic murmur     mild  . GERD (gastroesophageal reflux disease)   . COPD (chronic obstructive pulmonary disease)   . ESRD (end stage renal disease) 08/2011    TThSa, L forearm AV fistula, [name]  . Secondary hyperparathyroidism of renal origin   . Osteopenia 01/2013  . Bronchiectasis 08/2014     suggested by thoracic xray    Medications:  Prescriptions prior to admission  Medication Sig Dispense Refill Last Dose  . acetaminophen (TYLENOL) 500 MG tablet Take 1,000 mg by mouth every 6 (six) hours as needed for mild pain or headache.    11/08/2014 at [facility]   . albuterol (VENTOLIN HFA) 108 (90 BASE) MCG/ACT inhaler Inhale 2 puffs into the lungs every 6 (six) hours as needed for wheezing or shortness of breath.    11/09/2014 at Unknown time  . baclofen (LIORESAL) 10 MG tablet Take 5-10 mg by mouth 2 (two) times daily as needed for muscle spasms.   Past Week at Unknown time  . budesonide-formoterol (SYMBICORT) 160-4.5 MCG/ACT inhaler Inhale 2 puffs into the lungs 2 (two) times daily.   11/09/2014 at Unknown time  . calcitRIOL (ROCALTROL) 0.25 MCG capsule Take 0.25 mcg by mouth daily.   11/09/2014 at Unknown time  . calcium acetate (PHOSLO) 667 MG capsule Take 2,001 mg by mouth 3 (three) times daily with meals.    11/09/2014 at Unknown time  . cinacalcet (SENSIPAR) 60 MG tablet Take 60 mg by mouth daily.   11/08/2014 at Unknown time  . citalopram (CELEXA) 20 MG tablet Take 1 tablet (20 mg total) by mouth daily. 90 tablet 3 11/09/2014 at Unknown time  . donepezil (ARICEPT) 5 MG tablet Take 1 tablet (5 mg total) by mouth at bedtime. (Patient taking differently: Take  2.5 mg by mouth at bedtime. ) 30 tablet 3 11/08/2014 at Unknown time  . hydrOXYzine (ATARAX/VISTARIL) 25 MG tablet Take 12.5-25 mg by mouth 2 (two) times daily as needed for anxiety.   PRN at PRN  . lovastatin (MEVACOR) 20 MG tablet Take 20 mg by mouth at bedtime.   11/08/2014 at Unknown time  . pantoprazole (PROTONIX) 40 MG tablet Take 40 mg by mouth daily as needed (for heartburn/indigestion).   PRN at PRN  . promethazine (PHENERGAN) 12.5 MG tablet Take 12.5 mg by mouth every 6 (six) hours as needed for nausea or vomiting.   PRN at PRN  . traMADol (ULTRAM) 50 MG tablet Take 25 mg by mouth every 12 (twelve) hours as needed for moderate pain.   11/08/2014 at [facility]  . traZODone (DESYREL) 50 MG tablet Take 0.5-1 tablets (25-50 mg total) by mouth at bedtime. (Patient taking differently: Take 25 mg by mouth at bedtime. ) 30 tablet 3 11/08/2014 at Unknown time  . warfarin (COUMADIN) 2.5 MG tablet Take 2.5 mg by  mouth at bedtime.    11/08/2014 at Unknown time  . cyanocobalamin (,VITAMIN B-12,) 1000 MCG/ML injection Inject 1,000 mcg into the muscle every 30 (thirty) days. Start 10/2014 1 mL 0 unknown at unknown   Scheduled:  . budesonide-formoterol  2 puff Inhalation BID  . donepezil  5 mg Oral QHS  . multivitamin with minerals  1 tablet Oral Daily  . sevelamer carbonate  2,400 mg Oral TID WC  . traZODone  25-50 mg Oral QHS   Infusions:  . sodium chloride 75 mL/hr at 11/10/14 0700  . pantoprozole (PROTONIX) infusion 8 mg/hr (11/11/14 0138)   PRN: albuterol, benzonatate, hydrOXYzine, menthol-cetylpyridinium, sevelamer carbonate, traMADol, zolpidem  Assessment: Patient on warfarin PTA admitted with UGIB ordered Kcentra and vitamin K. KCentra 2500 units given along with vitamin k 10 mg IV x1 on 7/22.   Most recent INR 1.09 (7/23 at 2242)   Goal of Therapy:  Reversal of anticoagulatiion.    Plan:  Will continue current orders for  INR 1 hour after infusion then q 6 hours x 4 and daily x 2 thereafter (daily x2 INR orders left).    Bari Mantis PharmD Clinical Pharmacist 11/11/2014

## 2014-11-11 NOTE — Anesthesia Postprocedure Evaluation (Signed)
  Anesthesia Post-op Note  Patient: Ariel Smith  Procedure(s) Performed: Procedure(s): ESOPHAGOGASTRODUODENOSCOPY (EGD) (Left)  Anesthesia type:General  Patient location: PACU  Post pain: Pain level controlled  Post assessment: Post-op Vital signs reviewed, Patient's Cardiovascular Status Stable, Respiratory Function Stable, Patent Airway and No signs of Nausea or vomiting  Post vital signs: Reviewed and stable  Last Vitals:  Filed Vitals:   11/11/14 1320  BP:   Pulse: 112  Temp:   Resp: 23    Level of consciousness: awake, alert  and patient cooperative  Complications: No apparent anesthesia complications

## 2014-11-11 NOTE — Anesthesia Preprocedure Evaluation (Addendum)
Anesthesia Evaluation  Patient identified by MRN, date of birth, ID band Patient awake    Reviewed: Allergy & Precautions, NPO status , Patient's Chart, lab work & pertinent test results, reviewed documented beta blocker date and time   Airway Mallampati: II  TM Distance: >3 FB     Dental  (+) Edentulous Lower, Edentulous Upper   Pulmonary COPDformer smoker,          Cardiovascular hypertension, + Valvular Problems/Murmurs     Neuro/Psych  Headaches, PSYCHIATRIC DISORDERS Depression    GI/Hepatic GERD-  ,  Endo/Other    Renal/GU Renal disease     Musculoskeletal  (+) Arthritis -,   Abdominal   Peds  Hematology   Anesthesia Other Findings   Reproductive/Obstetrics                            Anesthesia Physical Anesthesia Plan  ASA: III  Anesthesia Plan: General   Post-op Pain Management:    Induction: Intravenous  Airway Management Planned: Nasal Cannula  Additional Equipment:   Intra-op Plan:   Post-operative Plan:   Informed Consent: I have reviewed the patients History and Physical, chart, labs and discussed the procedure including the risks, benefits and alternatives for the proposed anesthesia with the patient or authorized representative who has indicated his/her understanding and acceptance.     Plan Discussed with: CRNA  Anesthesia Plan Comments:         Anesthesia Quick Evaluation

## 2014-11-11 NOTE — Progress Notes (Signed)
Patient ID: EDNER SHUMARD, female   DOB: May 09, 1945, 69 y.o.   MRN: [id number] [facility] PROGRESS NOTE  PCP: [name], MD  HPI/Subjective: Patient's major complaint is cough. She states that she's had that ever since she's been on dialysis. She just came back from endoscopy. One black bowel movement yesterday none so far today.  Objective: Filed Vitals:   11/11/14 1228  BP:   Pulse: 110  Temp:   Resp: 16    Filed Weights   11/09/14 1129  Weight: 98.431 kg (217 lb)    ROS: Review of Systems  Constitutional: Negative for fever and chills.  Eyes: Negative for blurred vision.  Respiratory: Positive for cough. Negative for sputum production and shortness of breath.   Cardiovascular: Negative for chest pain.  Gastrointestinal: Positive for melena. Negative for nausea, vomiting, abdominal pain, diarrhea and constipation.  Genitourinary: Negative for dysuria.  Musculoskeletal: Negative for joint pain.  Neurological: Negative for dizziness and headaches.   Exam: Physical Exam  Constitutional: She is oriented to person, place, and time.  HENT:  Nose: No mucosal edema.  Mouth/Throat: No oropharyngeal exudate or posterior oropharyngeal edema.  Eyes: Conjunctivae, EOM and lids are normal. Pupils are equal, round, and reactive to light.  Neck: No JVD present. Carotid bruit is not present. No edema present. No thyroid mass and no thyromegaly present.  Cardiovascular: Regular rhythm, S1 normal and S2 normal.  Exam reveals no gallop.   No murmur heard. Pulses:      Dorsalis pedis pulses are 2+ on the right side, and 2+ on the left side.  Respiratory: No respiratory distress. She has no wheezes. She has no rhonchi. She has rales in the right lower field and the left lower field.  GI: Soft. Bowel sounds are normal. There is no tenderness.  Musculoskeletal:       Right ankle: She exhibits swelling.       Left ankle: She exhibits swelling.  Lymphadenopathy:   She has no cervical adenopathy.  Neurological: She is alert and oriented to person, place, and time. No cranial nerve deficit.  Skin: Skin is warm. No rash noted. Nails show no clubbing.  Psychiatric: She has a normal mood and affect.    Data Reviewed: Basic Metabolic Panel:  Recent Labs Lab 11/09/14 1132 11/10/14 0538  NA 138 140  K 4.2 4.1  CL 98* 101  CO2 27 30  GLUCOSE 142* 83  BUN 59* 41*  CREATININE 5.49* 4.27*  CALCIUM 7.9* 7.6*   Liver Function Tests:  Recent Labs Lab 11/09/14 1132  AST 25  ALT 18  ALKPHOS 58  BILITOT 0.4  PROT 5.9*  ALBUMIN 3.2*   CBC:  Recent Labs Lab 11/09/14 1132  11/09/14 2108 11/10/14 0538 11/10/14 0941 11/10/14 1542 11/11/14 0641  WBC 6.6  --   --  4.9  --   --  6.0  HGB 6.4*  < > 7.6* 7.1* 7.4* 6.8* 8.1*  HCT 18.9*  < > 21.9* 21.1* 21.3* 20.1* 23.9*  MCV 103.7*  --   --  95.3  --   --  96.5  PLT 196  --   --  136*  --   --  132*  < > = values in this interval not displayed.     Studies: [place] Extrem Low Bilat Comp  11/10/2014   ADDENDUM REPORT: 11/10/2014 19:00  ADDENDUM: Critical Value/emergent results were called by telephone at the time of interpretation on 11/10/2014 at 7:00 pm to [facility]  Freida Busman, RN, who verbally acknowledged these results.   Electronically Signed   By: Bretta Bang III M.D.   On: 11/10/2014 19:00   11/10/2014   CLINICAL DATA:  Lower extremity edema and pain for 1 week  EXAM: BILATERAL LOWER EXTREMITY VENOUS DUPLEX ULTRASOUND  TECHNIQUE: Gray-scale sonography with graded compression, as well as color Doppler and duplex ultrasound were performed to evaluate the lower extremity deep venous systems from the level of the common femoral vein and including the common femoral, femoral, profunda femoral, popliteal and calf veins including the posterior tibial, peroneal and gastrocnemius veins when visible. The superficial great saphenous vein was also interrogated. Spectral Doppler was utilized to evaluate flow at  rest and with distal augmentation maneuvers in the common femoral, femoral and popliteal veins.  COMPARISON:  Sep 15, 2011  FINDINGS: RIGHT LOWER EXTREMITY  Common Femoral Vein: No evidence of thrombus. Normal compressibility, respiratory phasicity and response to augmentation.  Saphenofemoral Junction: No evidence of thrombus. Normal compressibility and flow on color Doppler imaging.  Profunda Femoral Vein: No evidence of thrombus. Normal compressibility and flow on color Doppler imaging.  Femoral Vein: No evidence of thrombus. Normal compressibility, respiratory phasicity and response to augmentation.  Popliteal Vein: No evidence of thrombus. Normal compressibility, respiratory phasicity and response to augmentation.  Calf Veins: No evidence of thrombus. Normal compressibility and flow on color Doppler imaging. Note that the peroneal vein is not well visualized due to soft tissue edema.  Superficial Great Saphenous Vein: No evidence of thrombus. Normal compressibility and flow on color Doppler imaging.  Venous Reflux:  None.  Other Findings:  None.  LEFT LOWER EXTREMITY  Common Femoral Vein: No evidence of thrombus. Normal compressibility, respiratory phasicity and response to augmentation.  Saphenofemoral Junction: No evidence of thrombus. Normal compressibility and flow on color Doppler imaging.  Profunda Femoral Vein: No evidence of thrombus. Normal compressibility and flow on color Doppler imaging.  Femoral Vein: There is incompletely obstructing thrombus in the proximal left femoral vein. There is diminished compression and lack of augmentation focally in this area. The remainder of the left common femoral vein is somewhat diminutive without obvious thrombus.  Popliteal Vein: No evidence of thrombus. Normal compressibility, respiratory phasicity and response to augmentation.  Calf Veins: No evidence of thrombus. Normal compressibility and flow on color Doppler imaging. Peroneal vein not well visualized due to  soft tissue edema.  Superficial Great Saphenous Vein: No evidence of thrombus. Normal compressibility and flow on color Doppler imaging.  Venous Reflux:  None.  Other Findings:  None.  IMPRESSION: Incompletely obstructing thrombus in the proximal left femoral vein. No other appreciable areas of deep venous thrombosis. Neither peroneal vein well seen due to soft tissue edema.  Electronically Signed: By: Bretta Bang III M.D. On: 11/10/2014 18:43    Scheduled Meds: . [MAR Hold] budesonide-formoterol  2 puff Inhalation BID  . [MAR Hold] donepezil  5 mg Oral QHS  . ipratropium-albuterol  3 mL Nebulization Q6H  . midazolam  1 mg Intravenous Once  . [MAR Hold] multivitamin with minerals  1 tablet Oral Daily  . [MAR Hold] sevelamer carbonate  2,400 mg Oral TID WC  . [MAR Hold] traZODone  25-50 mg Oral QHS   Continuous Infusions: . pantoprozole (PROTONIX) infusion 8 mg/hr (11/11/14 1307)    Assessment/Plan:  1. Acute blood loss anemia- patient required another unit of blood yesterday afternoon with hemoglobin dropping down to 6.8. This morning's hemoglobin is up to 8.1. Patient has not had a  bowel movement today, so I am hoping that the bleeding has stopped. 2.   Likely upper GI bleed- endoscopy today only showed gastritis. Patient is on        Protonix drip. Started on full liquid diet. 3.   Hypovolemic shock- I will discontinue IV fluids at this point. Blood pressure         low but stable. 4.   Chronic respiratory failure and COPD- continue 2 L of oxygen and                      Symbicort. 5.   End-stage renal disease on hemodialysis- as per nurse may end up                   dialyzing today. If the patient does not do dialysis I will give Lasix 80 mg IV          1. 6.   Left lower extremity DVT- prior to discharge we'll have to decide whether or        not to put back on blood thinner or get an IVC filter.   Code Status:     Code Status Orders        Start     Ordered   11/09/14  1432  Full code   Continuous     11/09/14 1431    Advance Directive Documentation        Most Recent Value   Type of Advance Directive  Living will   Pre-existing out of facility DNR order (yellow form or pink MOST form)     "MOST" Form in Place?       Disposition Plan: To be determined  Consultants:  Gastrointestinal  nephrology  Procedures:  Endoscopy  Time spent: 25 minutes  Alford Highland  [facility]

## 2014-11-11 NOTE — Consult Note (Signed)
  GI Inpatient Follow-up Note  Patient Identification: Ariel Smith is a 69 y.o. female with melena and drop in hgb. Hgb to 6.8 yesterday. Given 1 unit. Hgb 8.1 this Am. Some coughing., NO CP. Doppler u/s positive. No BM yesterday.  Subjective:  Scheduled Inpatient Medications:  . budesonide-formoterol  2 puff Inhalation BID  . donepezil  5 mg Oral QHS  . multivitamin with minerals  1 tablet Oral Daily  . sevelamer carbonate  2,400 mg Oral TID WC  . traZODone  25-50 mg Oral QHS    Continuous Inpatient Infusions:   . sodium chloride 75 mL/hr at 11/11/14 0257  . pantoprozole (PROTONIX) infusion 8 mg/hr (11/11/14 0138)    PRN Inpatient Medications:  albuterol, benzonatate, hydrOXYzine, menthol-cetylpyridinium, sevelamer carbonate, traMADol, zolpidem  Review of Systems: Constitutional: Weight is stable.  Eyes: No changes in vision. ENT: No oral lesions, sore throat.  GI: see HPI.  Heme/Lymph: No easy bruising.  CV: No chest pain.  GU: No hematuria.  Integumentary: No rashes.  Neuro: No headaches.  Psych: No depression/anxiety.  Endocrine: No heat/cold intolerance.  Allergic/Immunologic: No urticaria.  Resp: No cough, SOB.  Musculoskeletal: No joint swelling.    Physical Examination: BP 127/75 mmHg  Pulse 103  Temp(Src) 98.6 F (37 C) (Axillary)  Resp 23  Ht 5\' 4"  (1.626 m)  Wt 98.431 kg (217 lb)  BMI 37.23 kg/m2  SpO2 91% Gen: NAD, alert and oriented x 4 HEENT: PEERLA, EOMI, Neck: supple, no JVD or thyromegaly Chest: CTA bilaterally, no wheezes, crackles, or other adventitious sounds CV: RRR, no m/g/c/r Abd: soft, NT, ND, +BS in all four quadrants; no HSM, guarding, ridigity, or rebound tenderness Ext: no edema, well perfused with 2+ pulses, Skin: no rash or lesions noted Lymph: no LAD  Data: Lab Results  Component Value Date   WBC 6.0 11/11/2014   HGB 8.1* 11/11/2014   HCT 23.9* 11/11/2014   MCV 96.5 11/11/2014   PLT 132* 11/11/2014    Recent  Labs Lab 11/10/14 0941 11/10/14 1542 11/11/14 0641  HGB 7.4* 6.8* 8.1*   Lab Results  Component Value Date   NA 140 11/10/2014   K 4.1 11/10/2014   CL 101 11/10/2014   CO2 30 11/10/2014   BUN 41* 11/10/2014   CREATININE 4.27* 11/10/2014   Lab Results  Component Value Date   ALT 18 11/09/2014   AST 25 11/09/2014   ALKPHOS 58 11/09/2014   BILITOT 0.4 11/09/2014    Recent Labs Lab 11/10/14 0538  11/11/14 0641  APTT 32  --   --   INR 1.20  < > 1.07  < > = values in this interval not displayed. Assessment/Plan: Ms. Ayars is a 69 y.o. female with  LGI bleeding. No active bleeding but hgb continues to drop.  Recommendations: EGD this AM. Please call with questions or concerns.  Jamari Diana, Ezzard Standing, MD

## 2014-11-11 NOTE — Transfer of Care (Signed)
Immediate Anesthesia Transfer of Care Note  Patient: Ariel Smith  Procedure(s) Performed: Procedure(s): ESOPHAGOGASTRODUODENOSCOPY (EGD) (Left)  Patient Location: PACU  Anesthesia Type:General  Level of Consciousness: awake, alert  and oriented  Airway & Oxygen Therapy: Patient Spontanous Breathing and Patient connected to face mask oxygen  Post-op Assessment: Report given to RN and Post -op Vital signs reviewed and stable  Post vital signs: Reviewed and stable  Last Vitals:  Filed Vitals:   11/11/14 1110  BP: 117/92  Pulse:   Temp:   Resp:     Complications: No apparent anesthesia complications

## 2014-11-11 NOTE — Progress Notes (Signed)
[facility]  ROUNDING NOTE   Subjective:  Pt due for EGD today.  Feels a bit nervous. Dialysis was held yesterday as plans for EGD noted today.   Objective:  Vital signs in last 24 hours:  Temp:  [98.3 F (36.8 C)-98.6 F (37 C)] 98.6 F (37 C) (07/24 0303) Pulse Rate:  [92-120] 103 (07/24 0700) Resp:  [15-25] 23 (07/24 0700) BP: (88-127)/(41-86) 127/75 mmHg (07/24 0700) SpO2:  [87 %-100 %] 91 % (07/24 0700)  Weight change:  Filed Weights   11/09/14 1129  Weight: 98.431 kg (217 lb)    Intake/Output: I/O last 3 completed shifts: In: 3935 [P.O.:300; I.V.:3075; Blood:310; IV Piggyback:250] Out: 479 [Other:479]   Intake/Output this shift:     Physical Exam: General: NAD, laying in bed  Head: Normocephalic, atraumatic. Moist oral mucosal membranes  Eyes: Anicteric  Neck: Supple, trachea midline  Lungs:  Clear to auscultation normal effort  Heart: Regular rate and rhythm  Abdomen:  Soft, nontender, BS present  Extremities:  trace peripheral edema.  Neurologic: Nonfocal, moving all four extremities  Skin: No lesions  Access: LUE AVF    Basic Metabolic Panel:  Recent Labs Lab 11/09/14 1132 11/10/14 0538  NA 138 140  K 4.2 4.1  CL 98* 101  CO2 27 30  GLUCOSE 142* 83  BUN 59* 41*  CREATININE 5.49* 4.27*  CALCIUM 7.9* 7.6*    Liver Function Tests:  Recent Labs Lab 11/09/14 1132  AST 25  ALT 18  ALKPHOS 58  BILITOT 0.4  PROT 5.9*  ALBUMIN 3.2*   No results for input(s): LIPASE, AMYLASE in the last 168 hours. No results for input(s): AMMONIA in the last 168 hours.  CBC:  Recent Labs Lab 11/09/14 1132  11/09/14 2108 11/10/14 0538 11/10/14 0941 11/10/14 1542 11/11/14 0641  WBC 6.6  --   --  4.9  --   --  6.0  HGB 6.4*  < > 7.6* 7.1* 7.4* 6.8* 8.1*  HCT 18.9*  < > 21.9* 21.1* 21.3* 20.1* 23.9*  MCV 103.7*  --   --  95.3  --   --  96.5  PLT 196  --   --  136*  --   --  132*  < > = values in this interval not  displayed.  Cardiac Enzymes: No results for input(s): CKTOTAL, CKMB, CKMBINDEX, TROPONINI in the last 168 hours.  BNP: Invalid input(s): POCBNP  CBG: No results for input(s): GLUCAP in the last 168 hours.  Microbiology: Results for orders placed or performed during the hospital encounter of 11/09/14  MRSA PCR Screening     Status: None   Collection Time: 11/09/14  9:40 PM  Result Value Ref Range Status   MRSA by PCR NEGATIVE NEGATIVE Final    Comment:        The GeneXpert MRSA Assay (FDA approved for NASAL specimens only), is one component of a comprehensive MRSA colonization surveillance program. It is not intended to diagnose MRSA infection nor to guide or monitor treatment for MRSA infections.     Coagulation Studies:  Recent Labs  11/10/14 0538 11/10/14 0941 11/10/14 1542 11/10/14 2242 11/11/14 0641  LABPROT 15.4* 15.6* 14.9 14.3 14.1  INR 1.20 1.22 1.15 1.09 1.07    Urinalysis: No results for input(s): COLORURINE, LABSPEC, PHURINE, GLUCOSEU, HGBUR, BILIRUBINUR, KETONESUR, PROTEINUR, UROBILINOGEN, NITRITE, LEUKOCYTESUR in the last 72 hours.  Invalid input(s): APPERANCEUR    Imaging: [place] Extrem Low Bilat Comp  11/10/2014   ADDENDUM REPORT: 11/10/2014  19:00  ADDENDUM: Critical Value/emergent results were called by telephone at the time of interpretation on 11/10/2014 at 7:00 pm to Jenel Lucks, RN, who verbally acknowledged these results.   Electronically Signed   By: Bretta Bang III M.D.   On: 11/10/2014 19:00   11/10/2014   CLINICAL DATA:  Lower extremity edema and pain for 1 week  EXAM: BILATERAL LOWER EXTREMITY VENOUS DUPLEX ULTRASOUND  TECHNIQUE: Gray-scale sonography with graded compression, as well as color Doppler and duplex ultrasound were performed to evaluate the lower extremity deep venous systems from the level of the common femoral vein and including the common femoral, femoral, profunda femoral, popliteal and calf veins including the posterior  tibial, peroneal and gastrocnemius veins when visible. The superficial great saphenous vein was also interrogated. Spectral Doppler was utilized to evaluate flow at rest and with distal augmentation maneuvers in the common femoral, femoral and popliteal veins.  COMPARISON:  Sep 15, 2011  FINDINGS: RIGHT LOWER EXTREMITY  Common Femoral Vein: No evidence of thrombus. Normal compressibility, respiratory phasicity and response to augmentation.  Saphenofemoral Junction: No evidence of thrombus. Normal compressibility and flow on color Doppler imaging.  Profunda Femoral Vein: No evidence of thrombus. Normal compressibility and flow on color Doppler imaging.  Femoral Vein: No evidence of thrombus. Normal compressibility, respiratory phasicity and response to augmentation.  Popliteal Vein: No evidence of thrombus. Normal compressibility, respiratory phasicity and response to augmentation.  Calf Veins: No evidence of thrombus. Normal compressibility and flow on color Doppler imaging. Note that the peroneal vein is not well visualized due to soft tissue edema.  Superficial Great Saphenous Vein: No evidence of thrombus. Normal compressibility and flow on color Doppler imaging.  Venous Reflux:  None.  Other Findings:  None.  LEFT LOWER EXTREMITY  Common Femoral Vein: No evidence of thrombus. Normal compressibility, respiratory phasicity and response to augmentation.  Saphenofemoral Junction: No evidence of thrombus. Normal compressibility and flow on color Doppler imaging.  Profunda Femoral Vein: No evidence of thrombus. Normal compressibility and flow on color Doppler imaging.  Femoral Vein: There is incompletely obstructing thrombus in the proximal left femoral vein. There is diminished compression and lack of augmentation focally in this area. The remainder of the left common femoral vein is somewhat diminutive without obvious thrombus.  Popliteal Vein: No evidence of thrombus. Normal compressibility, respiratory phasicity  and response to augmentation.  Calf Veins: No evidence of thrombus. Normal compressibility and flow on color Doppler imaging. Peroneal vein not well visualized due to soft tissue edema.  Superficial Great Saphenous Vein: No evidence of thrombus. Normal compressibility and flow on color Doppler imaging.  Venous Reflux:  None.  Other Findings:  None.  IMPRESSION: Incompletely obstructing thrombus in the proximal left femoral vein. No other appreciable areas of deep venous thrombosis. Neither peroneal vein well seen due to soft tissue edema.  Electronically Signed: By: Bretta Bang III M.D. On: 11/10/2014 18:43     Medications:   . sodium chloride 75 mL/hr at 11/11/14 0257  . pantoprozole (PROTONIX) infusion 8 mg/hr (11/11/14 0138)   . budesonide-formoterol  2 puff Inhalation BID  . donepezil  5 mg Oral QHS  . multivitamin with minerals  1 tablet Oral Daily  . sevelamer carbonate  2,400 mg Oral TID WC  . traZODone  25-50 mg Oral QHS   albuterol, benzonatate, hydrOXYzine, menthol-cetylpyridinium, sevelamer carbonate, traMADol, zolpidem  Assessment/ Plan:  69 y.o. female with past medical history of ESRD on HD TTHS, SHPTH, AOCD, HTN, hyperlipidemia, COPD,  DVT with PE, tx with long term coumadin and IVC filter placement, L knee TKA, R shoulder surgery  1. End-stage renal disease on hemodialysis Tuesday, Thursday, Saturday. Pt due for HD today as HD was held yesterday given plans for EGD today.  Dialysis should help to remove some of the sedation used for EGD.  Thereafter will plan for HD again on Tuesday if pt still here.  Will lower IVF rate to 30cc/hr.  2. Anemia of chronic kidney disease/anemia of blood loss. The patient was having black tarry stools at home. -Hgb up to 8.1 today, for EGD today.  3. Secondary hyperparathyroidism. Continue renvela 2400mg  po tid/wm.  4. Acute gastrointestinal hemorrhage K92.2:  Upper GI bleed noted, on protonix gtt.  For EGD today.   LOS: 2 Deven Audi,  Catie Chiao 7/24/20169:46 AM

## 2014-11-11 NOTE — Progress Notes (Signed)
ANTICOAGULATION CONSULT NOTE -follow up Consult  Pharmacy Consult for Kcentra  Indication: Upper GIB  Allergies  Allergen Reactions  . Other Other (See Comments)    Pt states that she is allergic to Midrin and it causes headaches.     Patient Measurements: Height:  (162.6 cm) Weight: 217 lb (98.431 kg) IBW/kg (Calculated) : 54.7   Vital Signs: Temp: 98.6 F (37 C) (07/24 0303) Temp Source: Axillary (07/24 0303) BP: 127/75 mmHg (07/24 0700) Pulse Rate: 103 (07/24 0700)  Labs:  Recent Labs  11/09/14 1132  11/10/14 0538 11/10/14 0941 11/10/14 1542 11/10/14 2242 11/11/14 0641  HGB 6.4*  < > 7.1* 7.4* 6.8*  --  8.1*  HCT 18.9*  < > 21.1* 21.3* 20.1*  --  23.9*  PLT 196  --  136*  --   --   --  132*  APTT  --   --  32  --   --   --   --   LABPROT 28.7*  < > 15.4* 15.6* 14.9 14.3 14.1  INR 2.69  < > 1.20 1.22 1.15 1.09 1.07  CREATININE 5.49*  --  4.27*  --   --   --   --   < > = values in this interval not displayed.  Estimated Creatinine Clearance: 14.4 mL/min (by C-G formula based on Cr of 4.27).   Medical History: Past Medical History  Diagnosis Date  . Osteoarthritis   . Depression   . Frequent headaches   . HTN (hypertension)   . HLD (hyperlipidemia)   . History of colon polyps 2013    colonoscopy ([name])  . 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 Systolic murmur     mild  . GERD (gastroesophageal reflux disease)   . COPD (chronic obstructive pulmonary disease)   . ESRD (end stage renal disease) 08/2011    TThSa, L forearm AV fistula, [name]  . Secondary hyperparathyroidism of renal origin   . Osteopenia 01/2013  . Bronchiectasis 08/2014     suggested by thoracic xray    Medications:  Prescriptions prior to admission  Medication Sig Dispense Refill Last Dose  . acetaminophen (TYLENOL) 500 MG tablet Take 1,000 mg by mouth every 6 (six) hours as needed  for mild pain or headache.    11/08/2014 at [facility]  . albuterol (VENTOLIN HFA) 108 (90 BASE) MCG/ACT inhaler Inhale 2 puffs into the lungs every 6 (six) hours as needed for wheezing or shortness of breath.    11/09/2014 at Unknown time  . baclofen (LIORESAL) 10 MG tablet Take 5-10 mg by mouth 2 (two) times daily as needed for muscle spasms.   Past Week at Unknown time  . budesonide-formoterol (SYMBICORT) 160-4.5 MCG/ACT inhaler Inhale 2 puffs into the lungs 2 (two) times daily.   11/09/2014 at Unknown time  . calcitRIOL (ROCALTROL) 0.25 MCG capsule Take 0.25 mcg by mouth daily.   11/09/2014 at Unknown time  . calcium acetate (PHOSLO) 667 MG capsule Take 2,001 mg by mouth 3 (three) times daily with meals.    11/09/2014 at Unknown time  . cinacalcet (SENSIPAR) 60 MG tablet Take 60 mg by mouth daily.   11/08/2014 at Unknown time  . citalopram (CELEXA) 20 MG tablet Take 1 tablet (20 mg total) by mouth daily. 90 tablet 3 11/09/2014 at Unknown time  . donepezil (ARICEPT) 5 MG tablet Take  1 tablet (5 mg total) by mouth at bedtime. (Patient taking differently: Take 2.5 mg by mouth at bedtime. ) 30 tablet 3 11/08/2014 at Unknown time  . hydrOXYzine (ATARAX/VISTARIL) 25 MG tablet Take 12.5-25 mg by mouth 2 (two) times daily as needed for anxiety.   PRN at PRN  . lovastatin (MEVACOR) 20 MG tablet Take 20 mg by mouth at bedtime.   11/08/2014 at Unknown time  . pantoprazole (PROTONIX) 40 MG tablet Take 40 mg by mouth daily as needed (for heartburn/indigestion).   PRN at PRN  . promethazine (PHENERGAN) 12.5 MG tablet Take 12.5 mg by mouth every 6 (six) hours as needed for nausea or vomiting.   PRN at PRN  . traMADol (ULTRAM) 50 MG tablet Take 25 mg by mouth every 12 (twelve) hours as needed for moderate pain.   11/08/2014 at Unsure  . traZODone (DESYREL) 50 MG tablet Take 0.5-1 tablets (25-50 mg total) by mouth at bedtime. (Patient taking differently: Take 25 mg by mouth at bedtime. ) 30 tablet 3 11/08/2014 at Unknown time  .  warfarin (COUMADIN) 2.5 MG tablet Take 2.5 mg by mouth at bedtime.    11/08/2014 at Unknown time  . cyanocobalamin (,VITAMIN B-12,) 1000 MCG/ML injection Inject 1,000 mcg into the muscle every 30 (thirty) days. Start 10/2014 1 mL 0 unknown at unknown   Scheduled:  . budesonide-formoterol  2 puff Inhalation BID  . donepezil  5 mg Oral QHS  . multivitamin with minerals  1 tablet Oral Daily  . sevelamer carbonate  2,400 mg Oral TID WC  . traZODone  25-50 mg Oral QHS   Infusions:  . sodium chloride 75 mL/hr at 11/11/14 0257  . pantoprozole (PROTONIX) infusion 8 mg/hr (11/11/14 0138)   PRN: albuterol, benzonatate, hydrOXYzine, menthol-cetylpyridinium, sevelamer carbonate, traMADol, zolpidem  Assessment: Patient on warfarin PTA admitted with UGIB ordered Kcentra and vitamin K. KCentra 2500 units given along with vitamin k 10 mg IV x1 on 7/22.   Most recent INR 1.07 (7/24 at 0641 -AM labs)   Goal of Therapy:  Reversal of anticoagulatiion.    Plan:  Will continue current orders for  INR 1 hour after infusion then q 6 hours x 4 and daily x 2 thereafter (daily x1 INR orders left).    Crist Fat, PharmD, BCPS Clinical Pharmacist 11/11/2014 9:13 AM

## 2014-11-11 NOTE — OR Nursing (Signed)
Duoneb treatment given pre-egd as ordered by anesthesia.

## 2014-11-12 ENCOUNTER — Inpatient Hospital Stay: Payer: Medicare Other

## 2014-11-12 ENCOUNTER — Encounter: Payer: Self-pay | Admitting: Radiology

## 2014-11-12 ENCOUNTER — Encounter
Admission: EM | Disposition: A | Discharge status: Skilled Nursing Facility | Payer: Self-pay | Source: Home / Self Care | Attending: Internal Medicine

## 2014-11-12 HISTORY — PX: PERIPHERAL VASCULAR CATHETERIZATION: SHX172C

## 2014-11-12 LAB — RENAL FUNCTION PANEL
ANION GAP: 10 (ref 5–15)
Albumin: 2.8 g/dL — ABNORMAL LOW (ref 3.5–5.0)
BUN: 27 mg/dL — AB (ref 6–20)
CALCIUM: 8 mg/dL — AB (ref 8.9–10.3)
CO2: 30 mmol/L (ref 22–32)
CREATININE: 4.55 mg/dL — AB (ref 0.44–1.00)
Chloride: 101 mmol/L (ref 101–111)
GFR calc Af Amer: 10 mL/min — ABNORMAL LOW (ref >60)
GFR calc non Af Amer: 9 mL/min — ABNORMAL LOW (ref >60)
GLUCOSE: 98 mg/dL (ref 65–99)
Phosphorus: 4 mg/dL (ref 2.5–4.6)
Potassium: 4.5 mmol/L (ref 3.5–5.1)
Sodium: 141 mmol/L (ref 135–145)

## 2014-11-12 LAB — PROTIME-INR
INR: 1.1
PROTHROMBIN TIME: 14.4 s (ref 11.4–15.0)

## 2014-11-12 LAB — HEMOGLOBIN: Hemoglobin: 7.7 g/dL — ABNORMAL LOW (ref 12.0–16.0)

## 2014-11-12 SURGERY — IVC FILTER INSERTION
Anesthesia: LOCAL

## 2014-11-12 MED ORDER — LIDOCAINE HCL (PF) 1 % IJ SOLN
INTRAMUSCULAR | Status: AC
  Filled 2014-11-12: qty 10

## 2014-11-12 MED ORDER — PENTAFLUOROPROP-TETRAFLUOROETH EX AERO
1.0000 | INHALATION_SPRAY | CUTANEOUS | Status: DC | PRN
Start: 2014-11-12 — End: 2014-11-22

## 2014-11-12 MED ORDER — VANCOMYCIN HCL IN DEXTROSE 750-5 MG/150ML-% IV SOLN
750.0000 mg | INTRAVENOUS | Status: DC
  Administered 2014-11-13 – 2014-11-22 (×5): 750 mg via INTRAVENOUS
  Filled 2014-11-12 (×9): qty 150

## 2014-11-12 MED ORDER — IOHEXOL 300 MG/ML  SOLN
INTRAMUSCULAR | Status: DC | PRN
  Administered 2014-11-12: 15 mL via INTRAVENOUS

## 2014-11-12 MED ORDER — MIDAZOLAM HCL 5 MG/5ML IJ SOLN
INTRAMUSCULAR | Status: AC
  Filled 2014-11-12: qty 5

## 2014-11-12 MED ORDER — FUROSEMIDE 10 MG/ML IJ SOLN
80.0000 mg | Freq: Once | INTRAMUSCULAR | Status: AC
  Administered 2014-11-12: 80 mg via INTRAVENOUS
  Filled 2014-11-12: qty 8

## 2014-11-12 MED ORDER — VANCOMYCIN HCL 10 G IV SOLR
1500.0000 mg | Freq: Once | INTRAVENOUS | Status: AC
  Administered 2014-11-12: 1500 mg via INTRAVENOUS
  Filled 2014-11-12 (×2): qty 1500

## 2014-11-12 MED ORDER — LIDOCAINE-PRILOCAINE 2.5-2.5 % EX CREA
1.0000 "application " | TOPICAL_CREAM | CUTANEOUS | Status: DC | PRN
  Administered 2014-11-13 – 2014-11-15 (×2): 1 via TOPICAL
  Filled 2014-11-12 (×2): qty 5

## 2014-11-12 MED ORDER — SODIUM CHLORIDE 0.9 % IV SOLN: 100.0000 mL | INTRAVENOUS | Status: DC | PRN

## 2014-11-12 MED ORDER — ONDANSETRON HCL 4 MG/2ML IJ SOLN: 4.0000 mg | Freq: Once | INTRAMUSCULAR | Status: DC | PRN

## 2014-11-12 MED ORDER — ALTEPLASE 2 MG IJ SOLR: 2.0000 mg | Freq: Once | INTRAMUSCULAR | Status: AC | PRN

## 2014-11-12 MED ORDER — METHYLPREDNISOLONE SODIUM SUCC 125 MG IJ SOLR
60.0000 mg | Freq: Three times a day (TID) | INTRAMUSCULAR | Status: DC
  Administered 2014-11-12 – 2014-11-13 (×3): 60 mg via INTRAVENOUS
  Filled 2014-11-12 (×3): qty 2

## 2014-11-12 MED ORDER — SODIUM CHLORIDE 0.9 % IV SOLN: INTRAVENOUS | Status: DC

## 2014-11-12 MED ORDER — LIDOCAINE HCL (PF) 1 % IJ SOLN
5.0000 mL | INTRAMUSCULAR | Status: DC | PRN
  Filled 2014-11-12: qty 5

## 2014-11-12 MED ORDER — CHLORHEXIDINE GLUCONATE CLOTH 2 % EX PADS: 6.0000 | MEDICATED_PAD | Freq: Once | CUTANEOUS | Status: DC

## 2014-11-12 MED ORDER — VANCOMYCIN HCL IN DEXTROSE 1-5 GM/200ML-% IV SOLN: 1000.0000 mg | Freq: Once | INTRAVENOUS | Status: DC

## 2014-11-12 MED ORDER — HEPARIN SODIUM (PORCINE) 1000 UNIT/ML DIALYSIS: 1000.0000 [IU] | INTRAMUSCULAR | Status: DC | PRN

## 2014-11-12 MED ORDER — FENTANYL CITRATE (PF) 100 MCG/2ML IJ SOLN: 25.0000 ug | INTRAMUSCULAR | Status: DC | PRN

## 2014-11-12 MED ORDER — PIPERACILLIN-TAZOBACTAM 3.375 G IVPB
3.3750 g | Freq: Two times a day (BID) | INTRAVENOUS | Status: DC
  Administered 2014-11-12 – 2014-11-19 (×16): 3.375 g via INTRAVENOUS
  Filled 2014-11-12 (×20): qty 50

## 2014-11-12 MED ORDER — PANTOPRAZOLE SODIUM 40 MG IV SOLR
40.0000 mg | Freq: Two times a day (BID) | INTRAVENOUS | Status: DC
  Administered 2014-11-12 – 2014-11-19 (×15): 40 mg via INTRAVENOUS
  Filled 2014-11-12 (×15): qty 40

## 2014-11-12 MED ORDER — HEPARIN (PORCINE) IN NACL 2-0.9 UNIT/ML-% IJ SOLN
INTRAMUSCULAR | Status: AC
  Filled 2014-11-12: qty 500

## 2014-11-12 MED ORDER — MIDAZOLAM HCL 2 MG/2ML IJ SOLN
INTRAMUSCULAR | Status: DC | PRN
  Administered 2014-11-12: 1 mg via INTRAVENOUS

## 2014-11-12 MED ORDER — NEPRO/CARBSTEADY PO LIQD: 237.0000 mL | ORAL | Status: DC | PRN

## 2014-11-12 SURGICAL SUPPLY — 3 items
FILTER VC CELECT-FEMORAL (Filter) ×2 IMPLANT
PACK ANGIOGRAPHY (CUSTOM PROCEDURE TRAY) ×2 IMPLANT
WIRE J 3MM .035X145CM (WIRE) ×2 IMPLANT

## 2014-11-12 NOTE — Progress Notes (Signed)
ANTICOAGULATION CONSULT NOTE - Follow Up Consult  Pharmacy Consult for Kcentra Indication: Upper GIB  Allergies  Allergen Reactions  . Other Other (See Comments)    Pt states that she is allergic to Midrin and it causes headaches.     Patient Measurements: Height: 5\' 4"  (162.6 cm) Weight: 217 lb (98.431 kg) IBW/kg (Calculated) : 54.7   Vital Signs: Temp: 99 F (37.2 C) (07/25 0700) Temp Source: Oral (07/25 0700) BP: 100/61 mmHg (07/25 1011) Pulse Rate: 112 (07/25 1011)  Labs:  Recent Labs  11/09/14 1132  11/10/14 0538 11/10/14 0941 11/10/14 1542 11/10/14 2242 11/11/14 0641 11/12/14 0633 11/12/14 0640  HGB 6.4*  < > 7.1* 7.4* 6.8*  --  8.1* 7.7*  --   HCT 18.9*  < > 21.1* 21.3* 20.1*  --  23.9*  --   --   PLT 196  --  136*  --   --   --  132*  --   --   APTT  --   --  32  --   --   --   --   --   --   LABPROT 28.7*  < > 15.4* 15.6* 14.9 14.3 14.1 14.4  --   INR 2.69  < > 1.20 1.22 1.15 1.09 1.07 1.10  --   CREATININE 5.49*  --  4.27*  --   --   --   --   --  4.55*  < > = values in this interval not displayed.  Estimated Creatinine Clearance: 13.5 mL/min (by C-G formula based on Cr of 4.55).   Medications:  Scheduled:  . budesonide-formoterol  2 puff Inhalation BID  . donepezil  5 mg Oral QHS  . multivitamin with minerals  1 tablet Oral Daily  . pantoprazole (PROTONIX) IV  40 mg Intravenous Q12H  . piperacillin-tazobactam (ZOSYN)  IV  3.375 g Intravenous Q12H  . sevelamer carbonate  2,400 mg Oral TID WC  . traZODone  25-50 mg Oral QHS  . vancomycin  1,500 mg Intravenous Once  . [START ON 11/13/2014] vancomycin  750 mg Intravenous Q T,Th,Sa-HD   Infusions:   PRN: sodium chloride, sodium chloride, albuterol, alteplase, benzonatate, chlorpheniramine-HYDROcodone, feeding supplement (NEPRO CARB STEADY), fentaNYL (SUBLIMAZE) injection, heparin, hydrOXYzine, lidocaine (PF), lidocaine-prilocaine, menthol-cetylpyridinium, ondansetron (ZOFRAN) IV, pantoprazole,  pentafluoroprop-tetrafluoroeth, sevelamer carbonate, traMADol, traMADol, zolpidem  Assessment: Patient on warfarin PTA admitted with UGIB ordered Kcentra and vitamin K. KCentra 2500 units given along with vitamin k 10 mg IV x1 on 7/22.   INR remains less than 1.5.   Plan:  This is the last serial INR s/p Kcentra so will sign off on consult at this point.   Luisa Hart D 11/12/2014,11:26 AM

## 2014-11-12 NOTE — Progress Notes (Signed)
Pt lying in bed resting comfortably at moment denies pain or distress. Pt had anxiety this shift , Pt sats dropped to low 77-80's. With increased WOB Increased [place] oxygen with no result. pt placed on NRB 100% for comfort . Which was effective . Pt daughter called in per pt request to stay with patient. Pt received Remus Loffler which was effective. sats improved with decreased anxiety. Pt oxygen titrated to i/2 flo with NRB tolerated well. Pt attempted to transition  Back to [place] unsuccessful . CXR ordered for this am.

## 2014-11-12 NOTE — Consult Note (Signed)
  GI Inpatient Follow-up Note  Patient Identification: Ariel Smith is a 69 y.o. female  Subjective: Breathing shallow. On facemask now. No active bleeding but hgb drifting down again. Able to obtain EGD/colon report form 5/31. GB surgery, EGD, and colon all done together in OR.  CT suggested left sided colitis but colon was completely normal, and colon bx was neg for colitis. EGD showed evidence of reflux esophagitis and mild duodenitis.  Scheduled Inpatient Medications:  . budesonide-formoterol  2 puff Inhalation BID  . donepezil  5 mg Oral QHS  . methylPREDNISolone (SOLU-MEDROL) injection  60 mg Intravenous 3 times per day  . multivitamin with minerals  1 tablet Oral Daily  . pantoprazole (PROTONIX) IV  40 mg Intravenous Q12H  . piperacillin-tazobactam (ZOSYN)  IV  3.375 g Intravenous Q12H  . sevelamer carbonate  2,400 mg Oral TID WC  . traZODone  25-50 mg Oral QHS  . vancomycin  1,500 mg Intravenous Once  . [START ON 11/13/2014] vancomycin  750 mg Intravenous Q T,Th,Sa-HD    Continuous Inpatient Infusions:     PRN Inpatient Medications:  sodium chloride, sodium chloride, albuterol, alteplase, benzonatate, chlorpheniramine-HYDROcodone, feeding supplement (NEPRO CARB STEADY), fentaNYL (SUBLIMAZE) injection, heparin, hydrOXYzine, lidocaine (PF), lidocaine-prilocaine, menthol-cetylpyridinium, ondansetron (ZOFRAN) IV, pantoprazole, pentafluoroprop-tetrafluoroeth, sevelamer carbonate, traMADol, traMADol, zolpidem  Review of Systems: Constitutional: Weight is stable.  Eyes: No changes in vision. ENT: No oral lesions, sore throat.  GI: see HPI.  Heme/Lymph: No easy bruising.  CV: No chest pain.  GU: No hematuria.  Integumentary: No rashes.  Neuro: No headaches.  Psych: No depression/anxiety.  Endocrine: No heat/cold intolerance.  Allergic/Immunologic: No urticaria.  Resp: No cough, SOB.  Musculoskeletal: No joint swelling.    Physical Examination: BP 100/61 mmHg  Pulse 112   Temp(Src) 99 F (37.2 C) (Oral)  Resp 25  Ht 5\' 4"  (1.626 m)  Wt 98.431 kg (217 lb)  BMI 37.23 kg/m2  SpO2 93% Gen: NAD, alert and oriented x 4 HEENT: PEERLA, EOMI, Neck: supple, no JVD or thyromegaly Chest: CTA bilaterally, no wheezes, crackles, or other adventitious sounds CV: RRR, no m/g/c/r Abd: soft, NT, ND, +BS in all four quadrants; no HSM, guarding, ridigity, or rebound tenderness Ext: no edema, well perfused with 2+ pulses, Skin: no rash or lesions noted Lymph: no LAD  Data: Lab Results  Component Value Date   WBC 6.0 11/11/2014   HGB 7.7* 11/12/2014   HCT 23.9* 11/11/2014   MCV 96.5 11/11/2014   PLT 132* 11/11/2014    Recent Labs Lab 11/10/14 1542 11/11/14 0641 11/12/14 0633  HGB 6.8* 8.1* 7.7*   Lab Results  Component Value Date   NA 141 11/12/2014   K 4.5 11/12/2014   CL 101 11/12/2014   CO2 30 11/12/2014   BUN 27* 11/12/2014   CREATININE 4.55* 11/12/2014   Lab Results  Component Value Date   ALT 18 11/09/2014   AST 25 11/09/2014   ALKPHOS 58 11/09/2014   BILITOT 0.4 11/09/2014    Recent Labs Lab 11/10/14 0538  11/12/14 0633  APTT 32  --   --   INR 1.20  < > 1.10  < > = values in this interval not displayed. Assessment/Plan: Ariel Smith is a 69 y.o. female  With anemia. Dropping hgb. EGD showed mild gastritis only.  Recommendations: Continue daily PPI. Respiratory condition not stable for colonoscopy right now. Hopefully, hgb remains stable. Please call with questions or concerns.  Ariel Smith, Ezzard Standing, MD

## 2014-11-12 NOTE — Progress Notes (Signed)
[facility]  ROUNDING NOTE   Subjective:  Patient remains critically ill. On 12 litres ventimask. Increased work of breathing. CXR with left apical opacity. Hemodialysis yesterday. UF of 1 litre. Tolerated procedure well. Daughter and husband at bedside.    Objective:  Vital signs in last 24 hours:  Temp:  [97.2 F (36.2 C)-99.3 F (37.4 C)] 99 F (37.2 C) (07/25 0700) Pulse Rate:  [104-128] 110 (07/25 0900) Resp:  [16-34] 24 (07/25 0900) BP: (97-150)/(42-92) 105/70 mmHg (07/25 0900) SpO2:  [85 %-99 %] 90 % (07/25 0900) FiO2 (%):  [50 %] 50 % (07/25 0900)  Weight change:  Filed Weights   11/09/14 1129  Weight: 98.431 kg (217 lb)    Intake/Output: I/O last 3 completed shifts: In: 3105 [I.V.:2825; Blood:280] Out: 1000 [Other:1000]   Intake/Output this shift:     Physical Exam: General: Critically ill appearing  Head: Normocephalic, atraumatic. +ventimask  Eyes: Anicteric  Neck: Supple, trachea midline  Lungs:  +wheezing  Heart: tachycardia  Abdomen:  Soft, nontender, BS present  Extremities:  no peripheral edema.  Neurologic: Nonfocal, moving all four extremities  Skin: No lesions  Access: LUE AVF    Basic Metabolic Panel:  Recent Labs Lab 11/09/14 1132 11/10/14 0538 11/12/14 0640  NA 138 140 141  K 4.2 4.1 4.5  CL 98* 101 101  CO2 27 30 30   GLUCOSE 142* 83 98  BUN 59* 41* 27*  CREATININE 5.49* 4.27* 4.55*  CALCIUM 7.9* 7.6* 8.0*  PHOS  --   --  4.0    Liver Function Tests:  Recent Labs Lab 11/09/14 1132 11/12/14 0640  AST 25  --   ALT 18  --   ALKPHOS 58  --   BILITOT 0.4  --   PROT 5.9*  --   ALBUMIN 3.2* 2.8*   No results for input(s): LIPASE, AMYLASE in the last 168 hours. No results for input(s): AMMONIA in the last 168 hours.  CBC:  Recent Labs Lab 11/09/14 1132  11/09/14 2108 11/10/14 0538 11/10/14 0941 11/10/14 1542 11/11/14 0641 11/12/14 0633  WBC 6.6  --   --  4.9  --   --  6.0  --   HGB 6.4*  < >  7.6* 7.1* 7.4* 6.8* 8.1* 7.7*  HCT 18.9*  < > 21.9* 21.1* 21.3* 20.1* 23.9*  --   MCV 103.7*  --   --  95.3  --   --  96.5  --   PLT 196  --   --  136*  --   --  132*  --   < > = values in this interval not displayed.  Cardiac Enzymes: No results for input(s): CKTOTAL, CKMB, CKMBINDEX, TROPONINI in the last 168 hours.  BNP: Invalid input(s): POCBNP  CBG: No results for input(s): GLUCAP in the last 168 hours.  Microbiology: Results for orders placed or performed during the hospital encounter of 11/09/14  MRSA PCR Screening     Status: None   Collection Time: 11/09/14  9:40 PM  Result Value Ref Range Status   MRSA by PCR NEGATIVE NEGATIVE Final    Comment:        The GeneXpert MRSA Assay (FDA approved for NASAL specimens only), is one component of a comprehensive MRSA colonization surveillance program. It is not intended to diagnose MRSA infection nor to guide or monitor treatment for MRSA infections.     Coagulation Studies:  Recent Labs  11/10/14 0941 11/10/14 1542 11/10/14 2242 11/11/14 0641 11/12/14  4098  LABPROT 15.6* 14.9 14.3 14.1 14.4  INR 1.22 1.15 1.09 1.07 1.10    Urinalysis: No results for input(s): COLORURINE, LABSPEC, PHURINE, GLUCOSEU, HGBUR, BILIRUBINUR, KETONESUR, PROTEINUR, UROBILINOGEN, NITRITE, LEUKOCYTESUR in the last 72 hours.  Invalid input(s): APPERANCEUR    Imaging: [place] Extrem Low Bilat Comp  11/10/2014   ADDENDUM REPORT: 11/10/2014 19:00  ADDENDUM: Critical Value/emergent results were called by telephone at the time of interpretation on 11/10/2014 at 7:00 pm to Jenel Lucks, RN, who verbally acknowledged these results.   Electronically Signed   By: Bretta Bang III M.D.   On: 11/10/2014 19:00   11/10/2014   CLINICAL DATA:  Lower extremity edema and pain for 1 week  EXAM: BILATERAL LOWER EXTREMITY VENOUS DUPLEX ULTRASOUND  TECHNIQUE: Gray-scale sonography with graded compression, as well as color Doppler and duplex ultrasound were  performed to evaluate the lower extremity deep venous systems from the level of the common femoral vein and including the common femoral, femoral, profunda femoral, popliteal and calf veins including the posterior tibial, peroneal and gastrocnemius veins when visible. The superficial great saphenous vein was also interrogated. Spectral Doppler was utilized to evaluate flow at rest and with distal augmentation maneuvers in the common femoral, femoral and popliteal veins.  COMPARISON:  Sep 15, 2011  FINDINGS: RIGHT LOWER EXTREMITY  Common Femoral Vein: No evidence of thrombus. Normal compressibility, respiratory phasicity and response to augmentation.  Saphenofemoral Junction: No evidence of thrombus. Normal compressibility and flow on color Doppler imaging.  Profunda Femoral Vein: No evidence of thrombus. Normal compressibility and flow on color Doppler imaging.  Femoral Vein: No evidence of thrombus. Normal compressibility, respiratory phasicity and response to augmentation.  Popliteal Vein: No evidence of thrombus. Normal compressibility, respiratory phasicity and response to augmentation.  Calf Veins: No evidence of thrombus. Normal compressibility and flow on color Doppler imaging. Note that the peroneal vein is not well visualized due to soft tissue edema.  Superficial Great Saphenous Vein: No evidence of thrombus. Normal compressibility and flow on color Doppler imaging.  Venous Reflux:  None.  Other Findings:  None.  LEFT LOWER EXTREMITY  Common Femoral Vein: No evidence of thrombus. Normal compressibility, respiratory phasicity and response to augmentation.  Saphenofemoral Junction: No evidence of thrombus. Normal compressibility and flow on color Doppler imaging.  Profunda Femoral Vein: No evidence of thrombus. Normal compressibility and flow on color Doppler imaging.  Femoral Vein: There is incompletely obstructing thrombus in the proximal left femoral vein. There is diminished compression and lack of  augmentation focally in this area. The remainder of the left common femoral vein is somewhat diminutive without obvious thrombus.  Popliteal Vein: No evidence of thrombus. Normal compressibility, respiratory phasicity and response to augmentation.  Calf Veins: No evidence of thrombus. Normal compressibility and flow on color Doppler imaging. Peroneal vein not well visualized due to soft tissue edema.  Superficial Great Saphenous Vein: No evidence of thrombus. Normal compressibility and flow on color Doppler imaging.  Venous Reflux:  None.  Other Findings:  None.  IMPRESSION: Incompletely obstructing thrombus in the proximal left femoral vein. No other appreciable areas of deep venous thrombosis. Neither peroneal vein well seen due to soft tissue edema.  Electronically Signed: By: Bretta Bang III M.D. On: 11/10/2014 18:43   Dg Chest Port 1 View  11/12/2014   CLINICAL DATA:  Shortness of Breath  EXAM: PORTABLE CHEST - 1 VIEW  COMPARISON:  01/12/2014  FINDINGS: Cardiac shadow is within normal limits. Patchy infiltrate is noted in  the medial left apex as well as in the right perihilar and infrahilar region. No sizable effusion is seen. No bony abnormality is noted.  IMPRESSION: Patchy infiltrates bilaterally. Followup PA and lateral chest X-ray is recommended in 3-4 weeks following trial of antibiotic therapy to ensure resolution and exclude underlying malignancy.   Electronically Signed   By: Alcide Clever M.D.   On: 11/12/2014 07:47     Medications:   . pantoprozole (PROTONIX) infusion 8 mg/hr (11/12/14 0915)   . budesonide-formoterol  2 puff Inhalation BID  . donepezil  5 mg Oral QHS  . multivitamin with minerals  1 tablet Oral Daily  . sevelamer carbonate  2,400 mg Oral TID WC  . traZODone  25-50 mg Oral QHS   sodium chloride, sodium chloride, albuterol, alteplase, benzonatate, chlorpheniramine-HYDROcodone, feeding supplement (NEPRO CARB STEADY), fentaNYL (SUBLIMAZE) injection, heparin,  hydrOXYzine, lidocaine (PF), lidocaine-prilocaine, menthol-cetylpyridinium, ondansetron (ZOFRAN) IV, pantoprazole, pentafluoroprop-tetrafluoroeth, sevelamer carbonate, traMADol, traMADol, zolpidem  Assessment/ Plan:  69 y.o. female with past medical history of ESRD on HD TTHS, SHPTH, AOCD, HTN, hyperlipidemia, COPD, DVT with PE, tx with long term coumadin and IVC filter placement, L knee TKA, R shoulder surgery  [facility] [address] [facility]  1. End-stage renal disease on hemodialysis: hemodialysis yesterday. UF of 1 litre. Tolerated procedure well. Next treatment for Tuesday, Monitor daily for dialysis need. No acute indication for dialysis.   2. Anemia of chronic kidney disease/anemia of blood loss. Hemoglobin 7.7. The patient was having black tarry stools at home. Gastritis on EGD - status post transfusion PRBC on 7/22 - epo with HD.   3. Secondary hyperparathyroidism. Continue renvela 2400mg  po tid/wm. - PTH 713 outpatient, not well cotnrolled.   4. Acute exacerbation of COPD with Respiratory Distress: increased work of breathing. Hypoxia. Follows with Dr. Meredeth Ide as outpatient.  - Check ABG - Check CT-chest - start empiric antibiotcs.  - nebs q 6 - discussed with RT.   5. Actue DVT: with history of DVT. History of IVC, currently without one.  - was on warfarin in the past.  - currently not on anticoagulation due to active GI bleed.    LOS: 3 Aizah Gehlhausen 7/25/20169:46 AM

## 2014-11-12 NOTE — Progress Notes (Signed)
Patient ID: Ariel Smith, female   DOB: 02/12/1946, 69 y.o.   MRN: [id number] [facility] PROGRESS NOTE  PCP: [name], MD  HPI/Subjective: Patient had a difficult night. She was placed on 100% nonrebreather secondary to hypoxia. She complains of shortness of breath and cough.   Objective: Filed Vitals:   11/12/14 1011  BP: 100/61  Pulse: 112  Temp:   Resp: 25    Filed Weights   11/09/14 1129  Weight: 98.431 kg (217 lb)    ROS: Review of Systems  Constitutional: Negative for fever and chills.  Eyes: Negative for blurred vision.  Respiratory: Positive for cough and shortness of breath. Negative for sputum production.   Cardiovascular: Negative for chest pain.  Gastrointestinal: Negative for nausea, vomiting, abdominal pain, diarrhea and constipation.  Genitourinary: Negative for dysuria.  Musculoskeletal: Negative for joint pain.  Neurological: Negative for dizziness and headaches.   Exam: Physical Exam  Constitutional: She is oriented to person, place, and time.  HENT:  Nose: No mucosal edema.  Mouth/Throat: No oropharyngeal exudate or posterior oropharyngeal edema.  Eyes: Conjunctivae, EOM and lids are normal. Pupils are equal, round, and reactive to light.  Neck: No JVD present. Carotid bruit is not present. No edema present. No thyroid mass and no thyromegaly present.  Cardiovascular: Regular rhythm, S1 normal and S2 normal.  Exam reveals no gallop.   No murmur heard. Pulses:      Dorsalis pedis pulses are 2+ on the right side, and 2+ on the left side.  Respiratory: No respiratory distress. She has no wheezes. She has rhonchi in the right upper field, the right middle field, the right lower field, the left upper field, the left middle field and the left lower field. She has no rales.  GI: Soft. Bowel sounds are normal. There is no tenderness.  Musculoskeletal:       Right ankle: She exhibits swelling.       Left ankle: She exhibits swelling.   Lymphadenopathy:    She has no cervical adenopathy.  Neurological: She is alert and oriented to person, place, and time. No cranial nerve deficit.  Skin: Skin is warm. No rash noted. Nails show no clubbing.  Psychiatric: She has a normal mood and affect.    Data Reviewed: Basic Metabolic Panel:  Recent Labs Lab 11/09/14 1132 11/10/14 0538 11/12/14 0640  NA 138 140 141  K 4.2 4.1 4.5  CL 98* 101 101  CO2 27 30 30   GLUCOSE 142* 83 98  BUN 59* 41* 27*  CREATININE 5.49* 4.27* 4.55*  CALCIUM 7.9* 7.6* 8.0*  PHOS  --   --  4.0   Liver Function Tests:  Recent Labs Lab 11/09/14 1132 11/12/14 0640  AST 25  --   ALT 18  --   ALKPHOS 58  --   BILITOT 0.4  --   PROT 5.9*  --   ALBUMIN 3.2* 2.8*   CBC:  Recent Labs Lab 11/09/14 1132  11/09/14 2108 11/10/14 0538 11/10/14 0941 11/10/14 1542 11/11/14 0641 11/12/14 0633  WBC 6.6  --   --  4.9  --   --  6.0  --   HGB 6.4*  < > 7.6* 7.1* 7.4* 6.8* 8.1* 7.7*  HCT 18.9*  < > 21.9* 21.1* 21.3* 20.1* 23.9*  --   MCV 103.7*  --   --  95.3  --   --  96.5  --   PLT 196  --   --  136*  --   --  132*  --   < > = values in this interval not displayed.     Studies: Ct Chest Wo Contrast  11/12/2014   CLINICAL DATA:  Exertional dyspnea.  EXAM: CT CHEST WITHOUT CONTRAST  TECHNIQUE: Multidetector CT imaging of the chest was performed following the standard protocol without IV contrast.  COMPARISON:  CT scan of August 15, 2013.  FINDINGS: No pneumothorax is noted. Mild bilateral pleural effusions are noted. Airspace opacity is noted in the upper and lower lobes bilaterally consistent with pneumonia or possibly edema. Atherosclerosis of thoracic aorta is noted without aneurysm formation. No significant mediastinal mass or adenopathy is noted. No significant osseous abnormality is noted. Visualized portion of upper abdomen demonstrates severe bilateral renal atrophy. Stable exophytic calcified abnormality is seen arising from right hepatic  lobe consistent with benign abnormality.  IMPRESSION: Bilateral upper lobe and lower lobe opacities are noted consistent with pneumonia or edema. Mild bilateral pleural effusions are noted.   Electronically Signed   By: Lupita Raider, M.D.   On: 11/12/2014 12:23   [place] Extrem Low Bilat Comp  11/10/2014   ADDENDUM REPORT: 11/10/2014 19:00  ADDENDUM: Critical Value/emergent results were called by telephone at the time of interpretation on 11/10/2014 at 7:00 pm to Jenel Lucks, RN, who verbally acknowledged these results.   Electronically Signed   By: Bretta Bang III M.D.   On: 11/10/2014 19:00   11/10/2014   CLINICAL DATA:  Lower extremity edema and pain for 1 week  EXAM: BILATERAL LOWER EXTREMITY VENOUS DUPLEX ULTRASOUND  TECHNIQUE: Gray-scale sonography with graded compression, as well as color Doppler and duplex ultrasound were performed to evaluate the lower extremity deep venous systems from the level of the common femoral vein and including the common femoral, femoral, profunda femoral, popliteal and calf veins including the posterior tibial, peroneal and gastrocnemius veins when visible. The superficial great saphenous vein was also interrogated. Spectral Doppler was utilized to evaluate flow at rest and with distal augmentation maneuvers in the common femoral, femoral and popliteal veins.  COMPARISON:  Sep 15, 2011  FINDINGS: RIGHT LOWER EXTREMITY  Common Femoral Vein: No evidence of thrombus. Normal compressibility, respiratory phasicity and response to augmentation.  Saphenofemoral Junction: No evidence of thrombus. Normal compressibility and flow on color Doppler imaging.  Profunda Femoral Vein: No evidence of thrombus. Normal compressibility and flow on color Doppler imaging.  Femoral Vein: No evidence of thrombus. Normal compressibility, respiratory phasicity and response to augmentation.  Popliteal Vein: No evidence of thrombus. Normal compressibility, respiratory phasicity and response to  augmentation.  Calf Veins: No evidence of thrombus. Normal compressibility and flow on color Doppler imaging. Note that the peroneal vein is not well visualized due to soft tissue edema.  Superficial Great Saphenous Vein: No evidence of thrombus. Normal compressibility and flow on color Doppler imaging.  Venous Reflux:  None.  Other Findings:  None.  LEFT LOWER EXTREMITY  Common Femoral Vein: No evidence of thrombus. Normal compressibility, respiratory phasicity and response to augmentation.  Saphenofemoral Junction: No evidence of thrombus. Normal compressibility and flow on color Doppler imaging.  Profunda Femoral Vein: No evidence of thrombus. Normal compressibility and flow on color Doppler imaging.  Femoral Vein: There is incompletely obstructing thrombus in the proximal left femoral vein. There is diminished compression and lack of augmentation focally in this area. The remainder of the left common femoral vein is somewhat diminutive without obvious thrombus.  Popliteal Vein: No evidence of thrombus. Normal compressibility, respiratory phasicity and response to  augmentation.  Calf Veins: No evidence of thrombus. Normal compressibility and flow on color Doppler imaging. Peroneal vein not well visualized due to soft tissue edema.  Superficial Great Saphenous Vein: No evidence of thrombus. Normal compressibility and flow on color Doppler imaging.  Venous Reflux:  None.  Other Findings:  None.  IMPRESSION: Incompletely obstructing thrombus in the proximal left femoral vein. No other appreciable areas of deep venous thrombosis. Neither peroneal vein well seen due to soft tissue edema.  Electronically Signed: By: Bretta Bang III M.D. On: 11/10/2014 18:43   Dg Chest Port 1 View  11/12/2014   CLINICAL DATA:  Shortness of Breath  EXAM: PORTABLE CHEST - 1 VIEW  COMPARISON:  01/12/2014  FINDINGS: Cardiac shadow is within normal limits. Patchy infiltrate is noted in the medial left apex as well as in the right  perihilar and infrahilar region. No sizable effusion is seen. No bony abnormality is noted.  IMPRESSION: Patchy infiltrates bilaterally. Followup PA and lateral chest X-ray is recommended in 3-4 weeks following trial of antibiotic therapy to ensure resolution and exclude underlying malignancy.   Electronically Signed   By: Alcide Clever M.D.   On: 11/12/2014 07:47    Scheduled Meds: . budesonide-formoterol  2 puff Inhalation BID  . donepezil  5 mg Oral QHS  . multivitamin with minerals  1 tablet Oral Daily  . pantoprazole (PROTONIX) IV  40 mg Intravenous Q12H  . piperacillin-tazobactam (ZOSYN)  IV  3.375 g Intravenous Q12H  . sevelamer carbonate  2,400 mg Oral TID WC  . traZODone  25-50 mg Oral QHS  . vancomycin  1,500 mg Intravenous Once  . [START ON 11/13/2014] vancomycin  750 mg Intravenous Q T,Th,Sa-HD    Assessment/Plan:  1. Acute on chronic respiratory failure with hypoxia. The patient desaturated overnight requiring 100% nonrebreather. A now down to 55% Ventimask. Add Solu-Medrol. 2. Acute pneumonia bilaterally versus fluid seen on CT scan. Patient placed on aggressive antibiotics with vancomycin and Zosyn. Patient given a dose of IV Lasix this morning. Patient had dialysis yesterday removing a liter of fluid. Dialysis to remove fluid. 3. Acute blood loss anemia- patient's hemoglobin today is 7.7. Watch closely for further bleeding. 4.   Likely upper GI bleed- endoscopy only showed gastritis. Patient is on Protonix drip. Started on        full liquid diet. 5.   Hypovolemic shock- blood pressure on the lower side. Continue to monitor. 6.   End-stage renal disease on hemodialysis- last hemodialysis yesterday afternoon. 7.   Left lower extremity DVT- I will get a vascular surgery consultation to evaluate for possibility of        IVC filter.   Code Status:     Code Status Orders        Start     Ordered   11/09/14 1432  Full code   Continuous     11/09/14 1431    Advance  Directive Documentation        Most Recent Value   Type of Advance Directive  Living will   Pre-existing out of facility DNR order (yellow form or pink MOST form)     "MOST" Form in Place?       Disposition Plan: To be determined  Consultants:  Gastrointestinal  nephrology  Procedures:  Endoscopy  Time spent: 40 minutes critical care time, the patient made a DO NOT RESUSCITATE.  Alford Highland  [facility]

## 2014-11-12 NOTE — Progress Notes (Signed)
ANTIBIOTIC CONSULT NOTE - INITIAL  Pharmacy Consult for Vancomycin/Zosyn Indication: rule out pneumonia  Allergies  Allergen Reactions  . Other Other (See Comments)    Pt states that she is allergic to Midrin and it causes headaches.     Patient Measurements: Height: 5\' 4"  (162.6 cm) Weight: 217 lb (98.431 kg) IBW/kg (Calculated) : 54.7 Adjusted Body Weight: 72.2 kg  Vital Signs: Temp: 99 F (37.2 C) (07/25 0700) Temp Source: Oral (07/25 0700) BP: 100/61 mmHg (07/25 1011) Pulse Rate: 112 (07/25 1011) Intake/Output from previous day: 07/24 0701 - 07/25 0700 In: 1625 [I.V.:1625] Out: 1000  Intake/Output from this shift:    Labs:  Recent Labs  11/09/14 1132  11/10/14 0538  11/10/14 1542 11/11/14 0641 11/12/14 0633 11/12/14 0640  WBC 6.6  --  4.9  --   --  6.0  --   --   HGB 6.4*  < > 7.1*  < > 6.8* 8.1* 7.7*  --   PLT 196  --  136*  --   --  132*  --   --   CREATININE 5.49*  --  4.27*  --   --   --   --  4.55*  < > = values in this interval not displayed. Estimated Creatinine Clearance: 13.5 mL/min (by C-G formula based on Cr of 4.55). No results for input(s): VANCOTROUGH, VANCOPEAK, VANCORANDOM, GENTTROUGH, GENTPEAK, GENTRANDOM, TOBRATROUGH, TOBRAPEAK, TOBRARND, AMIKACINPEAK, AMIKACINTROU, AMIKACIN in the last 72 hours.   Microbiology: Recent Results (from the past 720 hour(s))  MRSA PCR Screening     Status: None   Collection Time: 11/09/14  9:40 PM  Result Value Ref Range Status   MRSA by PCR NEGATIVE NEGATIVE Final    Comment:        The GeneXpert MRSA Assay (FDA approved for NASAL specimens only), is one component of a comprehensive MRSA colonization surveillance program. It is not intended to diagnose MRSA infection nor to guide or monitor treatment for MRSA infections.     Medical History: Past Medical History  Diagnosis Date  . Osteoarthritis   . Depression   . Frequent headaches   . HTN (hypertension)   . HLD (hyperlipidemia)   .  History of colon polyps 2013    colonoscopy ([name])  . 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 Systolic murmur     mild  . GERD (gastroesophageal reflux disease)   . COPD (chronic obstructive pulmonary disease)   . ESRD (end stage renal disease) 08/2011    TThSa, L forearm AV fistula, [name]  . Secondary hyperparathyroidism of renal origin   . Osteopenia 01/2013  . Bronchiectasis 08/2014     suggested by thoracic xray    Medications:  Scheduled:  . budesonide-formoterol  2 puff Inhalation BID  . donepezil  5 mg Oral QHS  . multivitamin with minerals  1 tablet Oral Daily  . pantoprazole (PROTONIX) IV  40 mg Intravenous Q12H  . piperacillin-tazobactam (ZOSYN)  IV  3.375 g Intravenous Q12H  . sevelamer carbonate  2,400 mg Oral TID WC  . traZODone  25-50 mg Oral QHS  . vancomycin  1,500 mg Intravenous Once  . [START ON 11/13/2014] vancomycin  750 mg Intravenous Q T,Th,Sa-HD   PRN: sodium chloride, sodium chloride, albuterol, alteplase, benzonatate, chlorpheniramine-HYDROcodone, feeding supplement (NEPRO CARB STEADY), fentaNYL (SUBLIMAZE) injection, heparin, hydrOXYzine, lidocaine (PF), lidocaine-prilocaine, menthol-cetylpyridinium, ondansetron (ZOFRAN)  IV, pantoprazole, pentafluoroprop-tetrafluoroeth, sevelamer carbonate, traMADol, traMADol, zolpidem  Assessment: 69 y/o F with ESRD on HD admitted for acute blood loss anemia ordered empiric abx for COPD exacerbation, r/o PNA.   Goal of Therapy:  Vancomycin trough level 15-20 mcg/ml  Plan:  1. Vancomycin 1500 mg iv once then 750 mg iv qHD. Will f/u dialysis schedule and check a level with the third dialysis session.   2. Zosyn 3.375 g iv q 12 h ordered.  Luisa Hart D 11/12/2014,11:22 AM

## 2014-11-12 NOTE — Op Note (Signed)
[facility]   OPERATIVE NOTE  DATE: 11/12/2014  PRE-OPERATIVE DIAGNOSIS: 1. DVT with GI bleed requiring cessation of anticoagulation 2. Hypercoagulable state 3. Pneumonia with tachypnea and poor pulmonary reserve  POST-OPERATIVE DIAGNOSIS: Same as above  PROCEDURE: 1.   Ultrasound guidance for vascular access to the right femoral vein 2.   Catheter placement into the inferior vena cava 3.   Inferior venacavogram 4.   Placement of a Cook Celect IVC filter  SURGEON: Festus Barren, MD  ASSISTANT(S): None  ANESTHESIA: local/sedation  ESTIMATED BLOOD LOSS: minimal  FINDING(S): 1.  Patent IVC  SPECIMEN(S):  none  INDICATIONS:   Ariel Smith is a 69 y.o. female who presents with pneumonia with tachypnea and hypoxia requiring significant oxygen supplementation. She has poor pulmonary reserve. In addition, she has a known hypercoagulable state and about a 2 week history of left lower extremity pain and swelling. Ultrasound showed a DVT. Her anticoagulation has had to be stopped due to a GI bleed. The gastroenterology service is following.  Inferior vena cava filter is indicated for this reason.  Risks and benefits including filter thrombosis, migration, fracture, bleeding, and infection were all discussed.  We discussed that all IVC filters that we place can be removed if desired from the patient once the need for the filter has passed.    DESCRIPTION: After obtaining full informed written consent, the patient was brought back to the vascular suite. The skin was sterilely prepped and draped in a sterile surgical field was created. The right femoral vein was accessed under direct ultrasound guidance without difficulty with a Seldinger needle and a J-wire was then placed. A permanent image was recorded. After skin nick and dilatation, the delivery sheath was placed into the inferior vena cava and an inferior venacavogram was performed. This demonstrated a patent IVC with  the level of the renal veins at the bottom of L1.  The filter was then deployed into the inferior vena cava at the level of L2 just below the renal veins. The delivery sheath was then removed. Pressure was held. Sterile dressings were placed. The patient tolerated the procedure well and was taken to the recovery room in stable condition.  COMPLICATIONS: None  CONDITION: Stable  DEW,JASON  11/12/2014, 5:17 PM

## 2014-11-12 NOTE — Consult Note (Signed)
[facility] Vascular Consult Note  MRN : [id number]  Ariel Smith is a 69 y.o. (08-Jul-1945) female who presents with chief complaint of  Chief Complaint  Patient presents with  . Hypotension  .  History of Present Illness: Patient is admitted with multiple medical issues including acute respiratory insufficiency and apparent pneumonia. She is on nonrebreather and has maintained adequate saturations with high-volume supplemental oxygen. She remained somewhat labored and her respirations and has remained in critical care unit. She had some volume overload for which she received an extra dialysis treatment that helped a little. She is on antibiotics for her pneumonia which has also helped a little. During her hospitalization, a slow gastrointestinal bleed has developed. Her hemoglobin has drifted down. She has a known history of multiple previous DVTs and has a hypercoagulable condition and is on chronic anticoagulation. She has been having pain and swelling in her left leg now for about a week or 2. This seems to be more than her chronic postphlebitic symptoms. The right lower extremity has not really been bothering her that much. Elevation and removing more fluid on dialysis have helped this some. There was no clear inciting event that made the start couple of weeks ago. A duplex was performed which was positive for DVT in the femoral vein. Partially occlusive thrombus and may very well be a chronic component, but acute thrombosis cannot be ruled out. Given her GI bleed her anticoagulation has to be stopped and we are asked to place an IVC filter.  Current Facility-Administered Medications  Medication Dose Route Frequency Provider Last Rate Last Dose  . 0.9 %  sodium chloride infusion  100 mL Intravenous PRN Munsoor Lateef, MD      . 0.9 %  sodium chloride infusion  100 mL Intravenous PRN Munsoor Lateef, MD      . 0.9 %  sodium chloride infusion   Intravenous Continuous  [name], MD      . albuterol (PROVENTIL) (2.5 MG/3ML) 0.083% nebulizer solution 2.5 mg  2.5 mg Nebulization Q6H PRN Ramonita Lab, MD   2.5 mg at 11/12/14 1010  . alteplase (CATHFLO ACTIVASE) injection 2 mg  2 mg Intracatheter Once PRN Munsoor Lateef, MD      . benzonatate (TESSALON) capsule 100 mg  100 mg Oral Q6H PRN Shaune Pollack, MD   100 mg at 11/11/14 3546  . budesonide-formoterol (SYMBICORT) 160-4.5 MCG/ACT inhaler 2 puff  2 puff Inhalation BID Ramonita Lab, MD   2 puff at 11/12/14 1103  . Chlorhexidine Gluconate Cloth 2 % PADS 6 each  6 each Topical Once [name], MD      . chlorpheniramine-HYDROcodone (TUSSIONEX) 10-8 MG/5ML suspension 5 mL  5 mL Oral Q12H PRN Alford Highland, MD   5 mL at 11/11/14 1718  . donepezil (ARICEPT) tablet 5 mg  5 mg Oral QHS Ramonita Lab, MD   5 mg at 11/11/14 2132  . feeding supplement (NEPRO CARB STEADY) liquid 237 mL  237 mL Oral PRN Munsoor Lateef, MD      . fentaNYL (SUBLIMAZE) injection 25 mcg  25 mcg Intravenous Q5 min PRN Berdine Addison, MD      . heparin injection 1,000 Units  1,000 Units Dialysis PRN Munsoor Lateef, MD      . hydrOXYzine (ATARAX/VISTARIL) tablet 25 mg  25 mg Oral TID PRN Ramonita Lab, MD   25 mg at 11/10/14 2310  . lidocaine (PF) (XYLOCAINE) 1 % injection 5 mL  5  mL Intradermal PRN Munsoor Lateef, MD      . lidocaine-prilocaine (EMLA) cream 1 application  1 application Topical PRN Munsoor Lateef, MD      . menthol-cetylpyridinium (CEPACOL) lozenge 3 mg  1 lozenge Oral PRN Shaune Pollack, MD   3 mg at 11/10/14 2305  . methylPREDNISolone sodium succinate (SOLU-MEDROL) 125 mg/2 mL injection 60 mg  60 mg Intravenous 3 times per day Alford Highland, MD   60 mg at 11/12/14 1442  . multivitamin with minerals tablet 1 tablet  1 tablet Oral Daily Ramonita Lab, MD   1 tablet at 11/12/14 1102  . ondansetron (ZOFRAN) injection 4 mg  4 mg Intravenous Once PRN Berdine Addison, MD      . pantoprazole (PROTONIX) EC tablet 40 mg  40 mg Oral Daily PRN [name],  MD   40 mg at 11/12/14 1442  . pantoprazole (PROTONIX) injection 40 mg  40 mg Intravenous Q12H Shane Crutch, MD   40 mg at 11/12/14 1450  . pentafluoroprop-tetrafluoroeth (GEBAUERS) aerosol 1 application  1 application Topical PRN Munsoor Lateef, MD      . piperacillin-tazobactam (ZOSYN) IVPB 3.375 g  3.375 g Intravenous Q12H Lamont Dowdy, MD   3.375 g at 11/12/14 1120  . sevelamer carbonate (RENVELA) tablet 1,600 mg  1,600 mg Oral TID BM PRN Ramonita Lab, MD   1,600 mg at 11/10/14 0806  . sevelamer carbonate (RENVELA) tablet 2,400 mg  2,400 mg Oral TID WC Ramonita Lab, MD   2,400 mg at 11/12/14 0757  . traMADol (ULTRAM) tablet 25 mg  25 mg Oral Q12H PRN [name], MD      . traMADol Janean Sark) tablet 50 mg  50 mg Oral Q12H PRN Ramonita Lab, MD   50 mg at 11/09/14 2330  . traZODone (DESYREL) tablet 25-50 mg  25-50 mg Oral QHS Ramonita Lab, MD   50 mg at 11/11/14 2132  . vancomycin (VANCOCIN) 1,500 mg in sodium chloride 0.9 % 500 mL IVPB  1,500 mg Intravenous Once Alford Highland, MD   1,500 mg at 11/12/14 1449  . [START ON 11/13/2014] vancomycin (VANCOCIN) IVPB 750 mg/150 ml premix  750 mg Intravenous Q T,Th,Sa-HD Alford Highland, MD      . zolpidem [facility]) tablet 5 mg  5 mg Oral QHS PRN Gracelyn Nurse, MD   5 mg at 11/11/14 2216    Past Medical History  Diagnosis Date  . Osteoarthritis   . Depression   . Frequent headaches   . HTN (hypertension)   . HLD (hyperlipidemia)   . History of colon polyps 2013    colonoscopy ([name])  . 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 Systolic murmur     mild  . GERD (gastroesophageal reflux disease)   . COPD (chronic obstructive pulmonary disease)   . ESRD (end stage renal disease) 08/2011    TThSa, L forearm AV fistula, [name]  . Secondary hyperparathyroidism of renal origin   . Osteopenia 01/2013  . Bronchiectasis 08/2014     suggested by thoracic  xray    Past Surgical History  Procedure Laterality Date  . Cholecystectomy  2012  . Bunionectomy Left 2003  . Replacement total knee Right 2006  . Shoulder arthroscopy Right 2009  . Colonoscopy  08/2011    colon biopsies, [name]  . Spirometry  04/2011    WNL  .  US echocardiography  06/2008    EF >55%  . Esophagogastroduodenoscopy  08/2011    gastric cardia polyp  . Tonsillectomy  1955  . Tubal ligation  1980  . Exteriorization of a continuous ambulatory peritoneal dialysis catheter  01/2013    removal 12/2103 - [name]  . Dexa  01/2013    osteopenia with -2.0 at hip and spine  . Hospitalization  12/2013    recurrent R pleural effusion due to peritoneal fluid translocation s/p rpt thoracentesis with 1.3 L fluid removed, ERSD started on HD this hospitalization  . US echocardiography  12/2013    EF 55-60%, nl LV sys fxn, mild-mod MR, AS, increased LV posterior wall thickness, mild TR  . Esophagogastroduodenoscopy Left 11/11/2014    Procedure: ESOPHAGOGASTRODUODENOSCOPY (EGD);  Surgeon: [name], MD;  Location: [facility] ENDOSCOPY;  Service: Endoscopy;  Laterality: Left;    Social History History  Substance Use Topics  . Smoking status: Former Smoker -- 2.00 packs/day for 30 years    Start date: 04/20/1978    Quit date: 04/20/2009  . Smokeless tobacco: Never Used  . Alcohol Use: No  Married, lives with husband  Family History Family History  Problem Relation Age of Onset  . Deep vein thrombosis Brother   . Cancer Mother 58    colon  . Cancer Father     prostate  . Hypertension Father   . Hypertension Brother   . Stroke Mother   . Diabetes Neg Hx   . CAD Neg Hx   . Cancer Daughter 60    breast  . Dementia Father 90  She and her brother have a known hypercoagulable state (Factor V Leiden)  Allergies  Allergen Reactions  . Other Other (See Comments)    Pt states that she is allergic to Midrin and it causes headaches.      REVIEW OF SYSTEMS (Negative unless  checked)  Constitutional: Weight loss  Fever  Chills Cardiac: Chest pain   Chest pressure   Palpitations   Shortness of breath when laying flat   Shortness of breath at rest   Shortness of breath with exertion. Vascular:  Pain in legs with walking   Pain in legs at rest   Pain in legs when laying flat   Claudication   Pain in feet when walking  Pain in feet at rest  Pain in feet when laying flat   History of DVT   Phlebitis   Swelling in legs   Varicose veins   Non-healing ulcers Pulmonary:   Uses home oxygen   Productive cough   Hemoptysis   Wheeze  COPD   Asthma Neurologic:  Dizziness  Blackouts   Seizures   History of stroke   History of TIA  Aphasia   Temporary blindness   Dysphagia   Weakness or numbness in arms   Weakness or numbness in legs Musculoskeletal:  Arthritis   Joint swelling   Joint pain   Low back pain Hematologic:  Easy bruising  Easy bleeding   Hypercoagulable state   Anemic  Hepatitis Gastrointestinal:  Blood in stool   Vomiting blood  Gastroesophageal reflux/heartburn   Difficulty swallowing. Genitourinary:  Chronic kidney disease   Difficult urination  Frequent urination  Burning with urination   Blood in urine Skin:  Rashes   Ulcers   Wounds Psychological:  History of anxiety    History of major depression.  Physical Examination  Filed Vitals:   11/12/14 0815 11/12/14 0900 11/12/14 1010 11/12/14  1011  BP: 118/60 105/70  100/61  Pulse: 113 110  112  Temp:      TempSrc:      Resp: 29 24  25   Height:      Weight:      SpO2: 93% 90% 95% 93%   Body mass index is 37.23 kg/(m^2). Gen:  WD/WN, NAD Head: [place]/AT, No temporalis wasting. Prominent temp pulse not noted. Ear/Nose/Throat: Hearing grossly intact, nares w/o erythema or drainage, oropharynx w/o Erythema/Exudate Eyes: PERRLA, EOMI.  Neck: Supple, no nuchal rigidity.  No bruit or JVD.   Pulmonary:  Coarse breath sounds with use of accessory muscles present. She is tachypnea can respirations are labored even on supplemental oxygen Cardiac: Tachycardic but regular. No murmurs Vascular: A/V fistula and left arm with bruit and thrill present Vessel Right Left  Radial Palpable Palpable                                   Gastrointestinal: soft, non-tender/non-distended. No guarding/reflex. No masses, surgical incisions, or scars. Musculoskeletal: M/S 5/5 throughout.  Extremities without ischemic changes.  No deformity or atrophy. 1+ right lower extremity edema, 2-3+ left lower extremity edema. Neurologic: CN 2-12 intact. Pain and light touch intact in extremities.  Symmetrical.  Speech is fluent. Motor exam as listed above. Psychiatric: Judgment intact, Mood & affect appropriate for pt's clinical situation. Dermatologic: No rashes or ulcers noted.  No cellulitis or open wounds. Lymph : No Cervical, Axillary, or Inguinal lymphadenopathy.      CBC Lab Results  Component Value Date   WBC 6.0 11/11/2014   HGB 7.7* 11/12/2014   HCT 23.9* 11/11/2014   MCV 96.5 11/11/2014   PLT 132* 11/11/2014    BMET    Component Value Date/Time   NA 141 11/12/2014 0640   NA 142 01/11/2014 1559   NA 141 08/10/2011   K 4.5 11/12/2014 0640   K 3.7 01/11/2014 1559   K 4.3 08/10/2011   CL 101 11/12/2014 0640   CL 109* 01/11/2014 1559   CO2 30 11/12/2014 0640   CO2 24 01/11/2014 1559   GLUCOSE 98 11/12/2014 0640   GLUCOSE 106* 01/11/2014 1559   BUN 27* 11/12/2014 0640   BUN 42* 01/11/2014 1559   CREATININE 4.55* 11/12/2014 0640   CREATININE 6.17* 03/30/2014 1724   CREATININE 6.27* 01/11/2014 1559   CALCIUM 8.0* 11/12/2014 0640   CALCIUM 8.5 01/11/2014 1559   CALCIUM 8.4 06/02/2013   GFRNONAA 9* 11/12/2014 0640   GFRNONAA 5* 11/21/2013 0027   GFRAA 10* 11/12/2014 0640   GFRAA 5* 11/21/2013 0027   Estimated Creatinine Clearance: 13.5 mL/min (by C-G formula based on Cr of  4.55).  COAG Lab Results  Component Value Date   INR 1.10 11/12/2014   INR 1.07 11/11/2014   INR 1.09 11/10/2014    Radiology Ultrasound demonstrates partially occlusive thrombus in the left femoral vein. The chronicity of this is unclear. Although this is likely a more chronic condition, an acute component cannot be ruled out    Assessment/Plan 1. LLE DVT in a patient with known hypercoagulable state: Now with GI bleed precluding continued anticoagulation. It is not entirely clear this clot is new or old, but given her hypercoagulable state she is extremely high risk for further thrombus and given her marginal respiratory status and pulmonary embolus could be fatal. As such, an IVC filter would be a reasonable option. Given her hypercoagulable state,  I would recommend removal of this filter once she has improved and she is back on anticoagulation. Risks and benefits were discussed with the family and the patient and she is agreeable to proceed 2. GI bleed: Requiring cessation of anticoagulation. For this reason an IVC filter should be placed. GI has seen. They do not feel she is stable enough to do a scope currently. They will continue to follow and monitor her. 3. Pneumonia and respiratory insufficiency: Getting treatment with antibiotics and diuresis with dialysis. Her limited pulmonary reserve as another indication for IVC filter in a patient who has to have her anticoagulation stopped. 4. End-stage renal disease: Continue to use AV access without difficulty. Has got neck treatments to help remove fluid which seems to be helping her respiratory status. 5. Hypercoagulable state: Once stable, would recommend restarting her anticoagulation. We'll be at high risk of filter thrombosis without anticoagulation. Should have filter removed in a few months when she is stable.  She is a complex and acutely ill individual with multiple medical comorbidities making her condition dangerous with a high  risk of morbidity and mortality.   Agron Swiney, MD  11/12/2014 4:12 PM

## 2014-11-12 NOTE — Care Management Important Message (Signed)
Important Message  Patient Details  Name: Ariel Smith MRN: [id number] Date of Birth: 27-Oct-1945   Medicare Important Message Given:  Yes-second notification given    Olegario Messier A Allmond 11/12/2014, 11:57 AM

## 2014-11-12 NOTE — Care Management Note (Signed)
Case Management Note  Patient Details  Name: Ariel Smith MRN: [id number] Date of Birth: 11-Aug-1945  Subjective/Objective:      [name] was admitted with GI Bleed and shortness of breath. EGD on 11/11/14 showed no active bleeding. Has received 3 units PRBCs since admission on 11/09/14. Hgb today is 7.7.  Had dialysis on Saturday. Currently on a Ventimask at 55%. Was on a 100% rebreather last night per low SATs. Pending a Vascular Surgery Consult per a left lower leg DVT. Case Management will continue to follow for discharge planning.            Action/Plan:   Expected Discharge Date:                  Expected Discharge Plan:     In-House Referral:     Discharge planning Services     Post Acute Care Choice:    Choice offered to:     DME Arranged:    DME Agency:     HH Arranged:    HH Agency:     Status of Service:     Medicare Important Message Given:  Yes-second notification given Date Medicare IM Given:    Medicare IM give by:    Date Additional Medicare IM Given:    Additional Medicare Important Message give by:     If discussed at Long Length of Stay Meetings, dates discussed:    Additional Comments:  Esperanza Madrazo A, RN 11/12/2014, 1:46 PM

## 2014-11-12 NOTE — Op Note (Signed)
[facility] Gastroenterology Patient Name: Ariel Smith Procedure Date: 11/11/2014 11:36 AM MRN: [id number] Account #: [id number] Date of Birth: 04-Oct-1945 Admit Type: Inpatient Age: 69 Room: [facility] ENDO ROOM 4 Gender: Female Note Status: Finalized Procedure:         Upper GI endoscopy Indications:       Iron deficiency anemia secondary to chronic blood loss,                     Melena Providers:         Ezzard Standing. Bluford Kaufmann, MD Referring MD:      Sallye Lat Md, MD (Referring MD) Medicines:         Sedation Required Anesthesia Staff Assistance, Monitored                     Anesthesia Care Complications:     No immediate complications. Procedure:         Pre-Anesthesia Assessment:                    - Prior to the procedure, a History and Physical was                     performed, and patient medications, allergies and                     sensitivities were reviewed. The patient's tolerance of                     previous anesthesia was reviewed.                    - The risks and benefits of the procedure and the sedation                     options and risks were discussed with the patient. All                     questions were answered and informed consent was obtained.                    - After reviewing the risks and benefits, the patient was                     deemed in satisfactory condition to undergo the procedure.                    After obtaining informed consent, the endoscope was passed                     under direct vision. Throughout the procedure, the                     patient's blood pressure, pulse, and oxygen saturations                     were monitored continuously. The Olympus GIF-160 endoscope                     (S#. [id number]) was introduced through the mouth, and                     advanced to the second part of duodenum. The upper GI  endoscopy was accomplished without difficulty. The patient                     tolerated  the procedure well. Findings:      The examined esophagus was normal.      Localized mildly erythematous mucosa was found in the cardia.      The exam was otherwise without abnormality.      The examined duodenum was normal. Impression:        - Normal esophagus.                    - Erythematous mucosa in the cardia.                    - The examination was otherwise normal.                    - Normal examined duodenum.                    - No specimens collected. Recommendation:    - Observe patient's clinical course.                    - Continue present medications.                    - The findings and recommendations were discussed with the                     patient. Procedure Code(s): --- Professional ---                    [phone], Esophagogastroduodenoscopy, flexible, transoral;                     diagnostic, including collection of specimen(s) by                     brushing or washing, when performed (separate procedure) Diagnosis Code(s): --- Professional ---                    K31.9, Disease of stomach and duodenum, unspecified                    D50.0, Iron deficiency anemia secondary to blood loss                     (chronic)                    K92.1, Melena CPT copyright 2014 American [employer]. All rights reserved. The codes documented in this report are preliminary and upon coder review may  be revised to meet current compliance requirements. [name], MD 11/11/2014 11:59:40 AM This report has been signed electronically. Number of Addenda: 0 Note Initiated On: 11/11/2014 11:36 AM      [facility]

## 2014-11-13 LAB — RENAL FUNCTION PANEL
ALBUMIN: 2.9 g/dL — AB (ref 3.5–5.0)
Anion gap: 12 (ref 5–15)
BUN: 50 mg/dL — AB (ref 6–20)
CO2: 26 mmol/L (ref 22–32)
Calcium: 8 mg/dL — ABNORMAL LOW (ref 8.9–10.3)
Chloride: 98 mmol/L — ABNORMAL LOW (ref 101–111)
Creatinine, Ser: 6.57 mg/dL — ABNORMAL HIGH (ref 0.44–1.00)
GFR calc non Af Amer: 6 mL/min — ABNORMAL LOW (ref >60)
GFR, EST AFRICAN AMERICAN: 7 mL/min — AB (ref >60)
Glucose, Bld: 172 mg/dL — ABNORMAL HIGH (ref 65–99)
Phosphorus: 4.9 mg/dL — ABNORMAL HIGH (ref 2.5–4.6)
Potassium: 4.6 mmol/L (ref 3.5–5.1)
Sodium: 136 mmol/L (ref 135–145)

## 2014-11-13 LAB — HEPATITIS B SURFACE ANTIBODY, QUANTITATIVE: Hepatitis B-Post: 10.8 m[IU]/mL (ref >9.9)

## 2014-11-13 LAB — HEMOGLOBIN: Hemoglobin: 8.1 g/dL — ABNORMAL LOW (ref 12.0–16.0)

## 2014-11-13 MED ORDER — IPRATROPIUM-ALBUTEROL 0.5-2.5 (3) MG/3ML IN SOLN
3.0000 mL | Freq: Four times a day (QID) | RESPIRATORY_TRACT | Status: DC
  Administered 2014-11-13 – 2014-11-18 (×21): 3 mL via RESPIRATORY_TRACT
  Filled 2014-11-13 (×21): qty 3

## 2014-11-13 MED ORDER — METHYLPREDNISOLONE SODIUM SUCC 40 MG IJ SOLR
40.0000 mg | Freq: Every day | INTRAMUSCULAR | Status: DC
  Administered 2014-11-14 – 2014-11-17 (×4): 40 mg via INTRAVENOUS
  Filled 2014-11-13 (×4): qty 1

## 2014-11-13 MED ORDER — BUDESONIDE 0.25 MG/2ML IN SUSP
0.2500 mg | Freq: Two times a day (BID) | RESPIRATORY_TRACT | Status: DC
Start: 2014-11-13 — End: 2014-11-22
  Administered 2014-11-13 – 2014-11-22 (×17): 0.25 mg via RESPIRATORY_TRACT
  Filled 2014-11-13 (×18): qty 2

## 2014-11-13 MED ORDER — CALCITRIOL 0.25 MCG PO CAPS
0.2500 ug | ORAL_CAPSULE | Freq: Every day | ORAL | Status: DC
  Administered 2014-11-13 – 2014-11-16 (×4): 0.25 ug via ORAL
  Filled 2014-11-13 (×6): qty 1

## 2014-11-13 MED ORDER — CINACALCET HCL 30 MG PO TABS
60.0000 mg | ORAL_TABLET | Freq: Every day | ORAL | Status: DC
  Administered 2014-11-14 – 2014-11-16 (×3): 60 mg via ORAL
  Filled 2014-11-13 (×4): qty 2

## 2014-11-13 MED ORDER — LIDOCAINE-PRILOCAINE 2.5-2.5 % EX CREA
TOPICAL_CREAM | Freq: Once | CUTANEOUS | Status: AC
  Administered 2014-11-13: 09:00:00 via TOPICAL
  Filled 2014-11-13: qty 5

## 2014-11-13 MED ORDER — EPOETIN ALFA 10000 UNIT/ML IJ SOLN
10000.0000 [IU] | Freq: Once | INTRAMUSCULAR | Status: AC
  Administered 2014-11-13: 10000 [IU] via INTRAVENOUS
  Filled 2014-11-13: qty 1

## 2014-11-13 MED ORDER — NEPRO/CARBSTEADY PO LIQD
237.0000 mL | Freq: Every morning | ORAL | Status: DC
  Administered 2014-11-13 – 2014-11-14 (×2): 237 mL via ORAL

## 2014-11-13 NOTE — Progress Notes (Signed)
ANTIBIOTIC CONSULT NOTE - FOLLOW UP  Pharmacy Consult for Vancomycin/Zosyn Indication: rule out pneumonia  Allergies  Allergen Reactions  . Other Other (See Comments)    Pt states that she is allergic to Midrin and it causes headaches.     Patient Measurements: Height: 5\' 4"  (162.6 cm) Weight: 233 lb 0.4 oz (105.7 kg) IBW/kg (Calculated) : 54.7   Vital Signs: Temp: 97.8 F (36.6 C) (07/26 1234) Temp Source: Oral (07/26 1234) BP: 98/59 mmHg (07/26 1234) Pulse Rate: 103 (07/26 1234) Intake/Output from previous day: 07/25 0701 - 07/26 0700 In: 947.5 [P.O.:240; I.V.:107.5; IV Piggyback:600] Out: -  Intake/Output from this shift: Total I/O In: 627 [P.O.:240; NG/GT:237; IV Piggyback:150] Out: 3000 [Other:3000]  Labs:  Recent Labs  11/11/14 0641 11/12/14 0633 11/12/14 0640 11/13/14 0736  WBC 6.0  --   --   --   HGB 8.1* 7.7*  --  8.1*  PLT 132*  --   --   --   CREATININE  --   --  4.55* 6.57*   Estimated Creatinine Clearance: 9.7 mL/min (by C-G formula based on Cr of 6.57). No results for input(s): VANCOTROUGH, VANCOPEAK, VANCORANDOM, GENTTROUGH, GENTPEAK, GENTRANDOM, TOBRATROUGH, TOBRAPEAK, TOBRARND, AMIKACINPEAK, AMIKACINTROU, AMIKACIN in the last 72 hours.   Microbiology: Recent Results (from the past 720 hour(s))  MRSA PCR Screening     Status: None   Collection Time: 11/09/14  9:40 PM  Result Value Ref Range Status   MRSA by PCR NEGATIVE NEGATIVE Final    Comment:        The GeneXpert MRSA Assay (FDA approved for NASAL specimens only), is one component of a comprehensive MRSA colonization surveillance program. It is not intended to diagnose MRSA infection nor to guide or monitor treatment for MRSA infections.     Anti-infectives    Start     Dose/Rate Route Frequency Ordered Stop   11/13/14 1200  vancomycin (VANCOCIN) IVPB 750 mg/150 ml premix     750 mg 150 mL/hr over 60 Minutes Intravenous Every T-Th-Sa (Hemodialysis) 11/12/14 1120     11/12/14  1130  vancomycin (VANCOCIN) 1,500 mg in sodium chloride 0.9 % 500 mL IVPB     1,500 mg 250 mL/hr over 120 Minutes Intravenous  Once 11/12/14 1117 11/12/14 1649   11/12/14 1030  piperacillin-tazobactam (ZOSYN) IVPB 3.375 g     3.375 g 12.5 mL/hr over 240 Minutes Intravenous Every 12 hours 11/12/14 1004     11/12/14 1015  vancomycin (VANCOCIN) IVPB 1000 mg/200 mL premix  Status:  Discontinued     1,000 mg 200 mL/hr over 60 Minutes Intravenous  Once 11/12/14 1004 11/12/14 1117      Assessment: 69 y/o F with ESRD on HD admitted for acute blood loss anemia ordered empiric abx for COPD exacerbation, r/o PNA.   Goal of Therapy:  Vancomycin pre-HD level: 15-25 mcg/mL  Plan:  Will continue vancomycin and Zosyn as ordered and check a pre-HD vanomcycin level 7/30.   Luisa Hart D 11/13/2014,1:49 PM

## 2014-11-13 NOTE — Progress Notes (Signed)
HD tx start 

## 2014-11-13 NOTE — Progress Notes (Signed)
Patient ID: Ariel Smith, female   DOB: 11-02-1945, 69 y.o.   MRN: [id number]   [facility] PROGRESS NOTE  PCP: [name], MD  HPI/Subjective: Patient continuously coughs after dialysis. She feels a little bit better but still with some shortness of breath. Currently on high flow nasal cannula 50% oxygen.  Objective: Filed Vitals:   11/13/14 1234  BP: 98/59  Pulse: 103  Temp: 97.8 F (36.6 C)  Resp: 22    Filed Weights   11/09/14 1129 11/13/14 0900 11/13/14 1234  Weight: 98.431 kg (217 lb) 108 kg (238 lb 1.6 oz) 105.7 kg (233 lb 0.4 oz)    ROS: Review of Systems  Constitutional: Negative for fever and chills.  Eyes: Negative for blurred vision.  Respiratory: Positive for cough and shortness of breath. Negative for sputum production.   Cardiovascular: Negative for chest pain.  Gastrointestinal: Negative for nausea, vomiting, abdominal pain, diarrhea and constipation.  Genitourinary: Negative for dysuria.  Musculoskeletal: Negative for joint pain.  Neurological: Negative for dizziness and headaches.   Exam: Physical Exam  Constitutional: She is oriented to person, place, and time.  HENT:  Nose: No mucosal edema.  Mouth/Throat: No oropharyngeal exudate or posterior oropharyngeal edema.  Eyes: Conjunctivae, EOM and lids are normal. Pupils are equal, round, and reactive to light.  Neck: No JVD present. Carotid bruit is not present. No edema present. No thyroid mass and no thyromegaly present.  Cardiovascular: Regular rhythm, S1 normal and S2 normal.  Exam reveals no gallop.   No murmur heard. Pulses:      Dorsalis pedis pulses are 2+ on the right side, and 2+ on the left side.  Respiratory: No respiratory distress. She has decreased breath sounds in the right lower field and the left lower field. She has no wheezes. She has rhonchi in the right lower field and the left lower field. She has no rales.  GI: Soft. Bowel sounds are normal. There is no  tenderness.  Musculoskeletal:       Right ankle: She exhibits swelling.       Left ankle: She exhibits swelling.  Lymphadenopathy:    She has no cervical adenopathy.  Neurological: She is alert and oriented to person, place, and time. No cranial nerve deficit.  Skin: Skin is warm. No rash noted. Nails show no clubbing.  Psychiatric: She has a normal mood and affect.    Data Reviewed: Basic Metabolic Panel:  Recent Labs Lab 11/09/14 1132 11/10/14 0538 11/12/14 0640 11/13/14 0736  NA 138 140 141 136  K 4.2 4.1 4.5 4.6  CL 98* 101 101 98*  CO2 27 30 30 26   GLUCOSE 142* 83 98 172*  BUN 59* 41* 27* 50*  CREATININE 5.49* 4.27* 4.55* 6.57*  CALCIUM 7.9* 7.6* 8.0* 8.0*  PHOS  --   --  4.0 4.9*   Liver Function Tests:  Recent Labs Lab 11/09/14 1132 11/12/14 0640 11/13/14 0736  AST 25  --   --   ALT 18  --   --   ALKPHOS 58  --   --   BILITOT 0.4  --   --   PROT 5.9*  --   --   ALBUMIN 3.2* 2.8* 2.9*   CBC:  Recent Labs Lab 11/09/14 1132  11/09/14 2108 11/10/14 0538 11/10/14 0941 11/10/14 1542 11/11/14 0641 11/12/14 0633 11/13/14 0736  WBC 6.6  --   --  4.9  --   --  6.0  --   --  HGB 6.4*  < > 7.6* 7.1* 7.4* 6.8* 8.1* 7.7* 8.1*  HCT 18.9*  < > 21.9* 21.1* 21.3* 20.1* 23.9*  --   --   MCV 103.7*  --   --  95.3  --   --  96.5  --   --   PLT 196  --   --  136*  --   --  132*  --   --   < > = values in this interval not displayed.     Studies: Ct Chest Wo Contrast  11/12/2014   CLINICAL DATA:  Exertional dyspnea.  EXAM: CT CHEST WITHOUT CONTRAST  TECHNIQUE: Multidetector CT imaging of the chest was performed following the standard protocol without IV contrast.  COMPARISON:  CT scan of August 15, 2013.  FINDINGS: No pneumothorax is noted. Mild bilateral pleural effusions are noted. Airspace opacity is noted in the upper and lower lobes bilaterally consistent with pneumonia or possibly edema. Atherosclerosis of thoracic aorta is noted without aneurysm formation. No  significant mediastinal mass or adenopathy is noted. No significant osseous abnormality is noted. Visualized portion of upper abdomen demonstrates severe bilateral renal atrophy. Stable exophytic calcified abnormality is seen arising from right hepatic lobe consistent with benign abnormality.  IMPRESSION: Bilateral upper lobe and lower lobe opacities are noted consistent with pneumonia or edema. Mild bilateral pleural effusions are noted.   Electronically Signed   By: Lupita Raider, M.D.   On: 11/12/2014 12:23   Dg Chest Port 1 View  11/12/2014   CLINICAL DATA:  Shortness of Breath  EXAM: PORTABLE CHEST - 1 VIEW  COMPARISON:  01/12/2014  FINDINGS: Cardiac shadow is within normal limits. Patchy infiltrate is noted in the medial left apex as well as in the right perihilar and infrahilar region. No sizable effusion is seen. No bony abnormality is noted.  IMPRESSION: Patchy infiltrates bilaterally. Followup PA and lateral chest X-ray is recommended in 3-4 weeks following trial of antibiotic therapy to ensure resolution and exclude underlying malignancy.   Electronically Signed   By: Alcide Clever M.D.   On: 11/12/2014 07:47    Scheduled Meds: . budesonide (PULMICORT) nebulizer solution  0.25 mg Nebulization BID  . budesonide-formoterol  2 puff Inhalation BID  . calcitRIOL  0.25 mcg Oral Daily  . [START ON 11/14/2014] cinacalcet  60 mg Oral Q breakfast  . donepezil  5 mg Oral QHS  . feeding supplement (NEPRO CARB STEADY)  237 mL Oral q morning - 10a  . ipratropium-albuterol  3 mL Nebulization Q6H  . [START ON 11/14/2014] methylPREDNISolone (SOLU-MEDROL) injection  40 mg Intravenous Daily  . multivitamin with minerals  1 tablet Oral Daily  . pantoprazole (PROTONIX) IV  40 mg Intravenous Q12H  . piperacillin-tazobactam (ZOSYN)  IV  3.375 g Intravenous Q12H  . sevelamer carbonate  2,400 mg Oral TID WC  . traZODone  25-50 mg Oral QHS  . vancomycin  750 mg Intravenous Q T,Th,Sa-HD     Assessment/Plan:  1. Acute on chronic respiratory failure with hypoxia. The patient on 50% high flow nasal cannula in order to oxygenate.  Solu-Medrol added yesterday secondary to poor air entry. 2. Acute pneumonia bilaterally versus fluid seen on CT scan. Patient placed on aggressive antibiotics with vancomycin and Zosyn. Patient had dialysis today and remove 3 L of fluid. 3. Acute blood loss anemia- patient's hemoglobin today is 8.1. Watch closely for further bleeding. As per nurse GI may be considering a colonoscopy. But with her being on 50%  high flow nasal cannula I will advance her diet. 4.   Likely upper GI bleed- endoscopy only showed gastritis. Patient is on Protonix. Advance diet. 5.   Hypovolemic shock- blood pressure on the lower side. Continue to monitor. 6.   End-stage renal disease on hemodialysis- last hemodialysis today. 7.   Left lower extremity DVT- s/p IVC filter.   Code Status:     Code Status Orders        Start     Ordered   11/09/14 1432  Full code   Continuous     11/09/14 1431    Advance Directive Documentation        Most Recent Value   Type of Advance Directive  Living will   Pre-existing out of facility DNR order (yellow form or pink MOST form)     "MOST" Form in Place?       Disposition Plan: To be determined  Consultants:  Gastrointestinal  nephrology  Procedures:  Endoscopy  IVC filter placement  Time spent: 25 minutes,  DO NOT RESUSCITATE.  Alford Highland  [facility]

## 2014-11-13 NOTE — Progress Notes (Signed)
[facility]  ROUNDING NOTE   Subjective:   Seen and examined on hemodialysis.  Daughter at bedside.  Breathing better Tmax 101 IVC filter placed   Objective:  Vital signs in last 24 hours:  Temp:  [97.6 F (36.4 C)-101 F (38.3 C)] 97.6 F (36.4 C) (07/26 0900) Pulse Rate:  [86-118] 97 (07/26 0900) Resp:  [15-31] 22 (07/26 0900) BP: (100-140)/(52-83) 127/64 mmHg (07/26 0900) SpO2:  [89 %-100 %] 92 % (07/26 0900) FiO2 (%):  [50 %-100 %] 55 % (07/26 0700)  Weight change:  Filed Weights   11/09/14 1129  Weight: 98.431 kg (217 lb)    Intake/Output: I/O last 3 completed shifts: In: 1247.5 [P.O.:240; I.V.:407.5; IV Piggyback:600] Out: 1000 [Other:1000]   Intake/Output this shift:     Physical Exam: General: Critically ill appearing  Head: Normocephalic, atraumatic. HFNC  Eyes: Anicteric  Neck: Supple, trachea midline  Lungs:  clear  Heart: regular  Abdomen:  Soft, nontender, BS present  Extremities:  no peripheral edema.  Neurologic: Nonfocal, moving all four extremities  Skin: No lesions  Access: LUE AVF    Basic Metabolic Panel:  Recent Labs Lab 11/09/14 1132 11/10/14 0538 11/12/14 0640  NA 138 140 141  K 4.2 4.1 4.5  CL 98* 101 101  CO2 27 30 30   GLUCOSE 142* 83 98  BUN 59* 41* 27*  CREATININE 5.49* 4.27* 4.55*  CALCIUM 7.9* 7.6* 8.0*  PHOS  --   --  4.0    Liver Function Tests:  Recent Labs Lab 11/09/14 1132 11/12/14 0640  AST 25  --   ALT 18  --   ALKPHOS 58  --   BILITOT 0.4  --   PROT 5.9*  --   ALBUMIN 3.2* 2.8*   No results for input(s): LIPASE, AMYLASE in the last 168 hours. No results for input(s): AMMONIA in the last 168 hours.  CBC:  Recent Labs Lab 11/09/14 1132  11/09/14 2108 11/10/14 0538 11/10/14 0941 11/10/14 1542 11/11/14 0641 11/12/14 0633 11/13/14 0736  WBC 6.6  --   --  4.9  --   --  6.0  --   --   HGB 6.4*  < > 7.6* 7.1* 7.4* 6.8* 8.1* 7.7* 8.1*  HCT 18.9*  < > 21.9* 21.1* 21.3* 20.1*  23.9*  --   --   MCV 103.7*  --   --  95.3  --   --  96.5  --   --   PLT 196  --   --  136*  --   --  132*  --   --   < > = values in this interval not displayed.  Cardiac Enzymes: No results for input(s): CKTOTAL, CKMB, CKMBINDEX, TROPONINI in the last 168 hours.  BNP: Invalid input(s): POCBNP  CBG: No results for input(s): GLUCAP in the last 168 hours.  Microbiology: Results for orders placed or performed during the hospital encounter of 11/09/14  MRSA PCR Screening     Status: None   Collection Time: 11/09/14  9:40 PM  Result Value Ref Range Status   MRSA by PCR NEGATIVE NEGATIVE Final    Comment:        The GeneXpert MRSA Assay (FDA approved for NASAL specimens only), is one component of a comprehensive MRSA colonization surveillance program. It is not intended to diagnose MRSA infection nor to guide or monitor treatment for MRSA infections.     Coagulation Studies:  Recent Labs  11/10/14 0941 11/10/14 1542 11/10/14  2242 11/11/14 0641 11/12/14 0633  LABPROT 15.6* 14.9 14.3 14.1 14.4  INR 1.22 1.15 1.09 1.07 1.10    Urinalysis: No results for input(s): COLORURINE, LABSPEC, PHURINE, GLUCOSEU, HGBUR, BILIRUBINUR, KETONESUR, PROTEINUR, UROBILINOGEN, NITRITE, LEUKOCYTESUR in the last 72 hours.  Invalid input(s): APPERANCEUR    Imaging: Ct Chest Wo Contrast  11/12/2014   CLINICAL DATA:  Exertional dyspnea.  EXAM: CT CHEST WITHOUT CONTRAST  TECHNIQUE: Multidetector CT imaging of the chest was performed following the standard protocol without IV contrast.  COMPARISON:  CT scan of August 15, 2013.  FINDINGS: No pneumothorax is noted. Mild bilateral pleural effusions are noted. Airspace opacity is noted in the upper and lower lobes bilaterally consistent with pneumonia or possibly edema. Atherosclerosis of thoracic aorta is noted without aneurysm formation. No significant mediastinal mass or adenopathy is noted. No significant osseous abnormality is noted. Visualized  portion of upper abdomen demonstrates severe bilateral renal atrophy. Stable exophytic calcified abnormality is seen arising from right hepatic lobe consistent with benign abnormality.  IMPRESSION: Bilateral upper lobe and lower lobe opacities are noted consistent with pneumonia or edema. Mild bilateral pleural effusions are noted.   Electronically Signed   By: Lupita Raider, M.D.   On: 11/12/2014 12:23   Dg Chest Port 1 View  11/12/2014   CLINICAL DATA:  Shortness of Breath  EXAM: PORTABLE CHEST - 1 VIEW  COMPARISON:  01/12/2014  FINDINGS: Cardiac shadow is within normal limits. Patchy infiltrate is noted in the medial left apex as well as in the right perihilar and infrahilar region. No sizable effusion is seen. No bony abnormality is noted.  IMPRESSION: Patchy infiltrates bilaterally. Followup PA and lateral chest X-ray is recommended in 3-4 weeks following trial of antibiotic therapy to ensure resolution and exclude underlying malignancy.   Electronically Signed   By: Alcide Clever M.D.   On: 11/12/2014 07:47     Medications:     . budesonide-formoterol  2 puff Inhalation BID  . donepezil  5 mg Oral QHS  . epoetin (EPOGEN/PROCRIT) injection  10,000 Units Intravenous Once  . feeding supplement (NEPRO CARB STEADY)  237 mL Oral q morning - 10a  . methylPREDNISolone (SOLU-MEDROL) injection  60 mg Intravenous 3 times per day  . multivitamin with minerals  1 tablet Oral Daily  . pantoprazole (PROTONIX) IV  40 mg Intravenous Q12H  . piperacillin-tazobactam (ZOSYN)  IV  3.375 g Intravenous Q12H  . sevelamer carbonate  2,400 mg Oral TID WC  . traZODone  25-50 mg Oral QHS  . vancomycin  750 mg Intravenous Q T,Th,Sa-HD   sodium chloride, sodium chloride, albuterol, benzonatate, chlorpheniramine-HYDROcodone, heparin, hydrOXYzine, lidocaine (PF), lidocaine-prilocaine, menthol-cetylpyridinium, pantoprazole, pentafluoroprop-tetrafluoroeth, sevelamer carbonate, traMADol, traMADol, zolpidem  Assessment/  Plan:  69 y.o. female with past medical history of ESRD on HD TTHS, SHPTH, AOCD, HTN, hyperlipidemia, COPD, DVT with PE, tx with long term coumadin and IVC filter placement, L knee TKA, R shoulder surgery  [facility] [address]  1. End-stage renal disease on hemodialysis: seen and examined on hemodialysis . UF of goal 2 litre. Tolerating procedure well.  Monitor daily for dialysis need. No acute indication for dialysis.   2. Anemia of chronic kidney disease/anemia of blood loss. Hemoglobin 8.1. The patient was having black tarry stools at home. Gastritis on EGD - status post transfusion PRBC on 7/22 - epo with HD.   3. Secondary hyperparathyroidism. Continue renvela 2400mg  po tid/wm. - PTH 713 outpatient, not well cotnrolled.   4. Acute exacerbation  of COPD with Respiratory Distress: improved clinically - solumedrol, empiric vanc and zosyn.  - Continue oxygen.   5. Actue DVT: with history of DVT. Status post IVC placed 7/25 [name].  - was on warfarin in the past.  - currently not on anticoagulation due to active GI bleed.    LOS: 4 Ariel Smith 7/26/20169:09 AM

## 2014-11-13 NOTE — Consult Note (Signed)
PULMONARY / CRITICAL CARE MEDICINE   Name: Ariel Smith MRN: [id number] DOB: [birth year]    ADMISSION DATE:  11/09/2014    CHIEF COMPLAINT:   SOB   HISTORY OF PRESENT ILLNESS   69 y.o. female with a known history of hypertension, hyperlipidemia, DVT and multiple other medical problems on Coumadin is brought into the ED with a chief complaint of weakness and fatigue associated with black tarry stool. She had a prior colonoscopy many years ago and was told she had a single polyp. She denies vomiting blood except for 1 episode about 2 weeks ago where she threw up and had a little bit of red blood in it. No chest pain . Patient subsequently developed acute SOB and increased WOB while in hospital and states that she has been SOb for several weeks, patient also with cough and on chronic oxygen therapy, she denies fevers, chills. Patient is exposed to second hand smoke from daughters.    Patient has history of end-stage renal disease and gets hemodialysis on Tuesday Thursday and Saturday.  She was initially admitted fro acute GIB, had ben on coumadin for DVT. Coumadin stopped and IVC filter obtained        PAST MEDICAL HISTORY    :  Past Medical History  Diagnosis Date  . Osteoarthritis   . Depression   . Frequent headaches   . HTN (hypertension)   . HLD (hyperlipidemia)   . History of colon polyps 2013    colonoscopy ([name])  . 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 Systolic murmur     mild  . GERD (gastroesophageal reflux disease)   . COPD (chronic obstructive pulmonary disease)   . ESRD (end stage renal disease) 08/2011    TThSa, L forearm AV fistula, [name]  . Secondary hyperparathyroidism of renal origin   . Osteopenia 01/2013  . Bronchiectasis 08/2014     suggested by thoracic xray   Past Surgical History  Procedure Laterality Date  . Cholecystectomy  2012  .  Bunionectomy Left 2003  . Replacement total knee Right 2006  . Shoulder arthroscopy Right 2009  . Colonoscopy  08/2011    colon biopsies, [name]  . Spirometry  04/2011    WNL  . US echocardiography  06/2008    EF >55%  . Esophagogastroduodenoscopy  08/2011    gastric cardia polyp  . Tonsillectomy  1955  . Tubal ligation  1980  . Exteriorization of a continuous ambulatory peritoneal dialysis catheter  01/2013    removal 12/2103 - [name]  . Dexa  01/2013    osteopenia with -2.0 at hip and spine  . Hospitalization  12/2013    recurrent R pleural effusion due to peritoneal fluid translocation s/p rpt thoracentesis with 1.3 L fluid removed, ERSD started on HD this hospitalization  . US echocardiography  12/2013    EF 55-60%, nl LV sys fxn, mild-mod MR, AS, increased LV posterior wall thickness, mild TR  . Esophagogastroduodenoscopy Left 11/11/2014    Procedure: ESOPHAGOGASTRODUODENOSCOPY (EGD);  Surgeon: [name], MD;  Location: [facility] ENDOSCOPY;  Service: Endoscopy;  Laterality: Left;   Prior to Admission medications   Medication Sig Start Date End Date Taking? Authorizing Provider  acetaminophen (TYLENOL) 500 MG tablet Take 1,000 mg by mouth every 6 (six) hours as needed for mild pain or headache.    Yes Historical Provider,  MD  albuterol (VENTOLIN HFA) 108 (90 BASE) MCG/ACT inhaler Inhale 2 puffs into the lungs every 6 (six) hours as needed for wheezing or shortness of breath.    Yes Historical Provider, MD  baclofen (LIORESAL) 10 MG tablet Take 5-10 mg by mouth 2 (two) times daily as needed for muscle spasms.   Yes Historical Provider, MD  budesonide-formoterol (SYMBICORT) 160-4.5 MCG/ACT inhaler Inhale 2 puffs into the lungs 2 (two) times daily.   Yes Historical Provider, MD  calcitRIOL (ROCALTROL) 0.25 MCG capsule Take 0.25 mcg by mouth daily.   Yes Historical Provider, MD  calcium acetate (PHOSLO) 667 MG capsule Take 2,001 mg by mouth 3 (three) times daily with meals.    Yes Historical  Provider, MD  cinacalcet (SENSIPAR) 60 MG tablet Take 60 mg by mouth daily.   Yes Historical Provider, MD  citalopram (CELEXA) 20 MG tablet Take 1 tablet (20 mg total) by mouth daily. 03/30/14  Yes [name], MD  donepezil (ARICEPT) 5 MG tablet Take 1 tablet (5 mg total) by mouth at bedtime. Patient taking differently: Take 2.5 mg by mouth at bedtime.  11/01/14  Yes [name], MD  hydrOXYzine (ATARAX/VISTARIL) 25 MG tablet Take 12.5-25 mg by mouth 2 (two) times daily as needed for anxiety.   Yes Historical Provider, MD  lovastatin (MEVACOR) 20 MG tablet Take 20 mg by mouth at bedtime.   Yes Historical Provider, MD  pantoprazole (PROTONIX) 40 MG tablet Take 40 mg by mouth daily as needed (for heartburn/indigestion).   Yes Historical Provider, MD  promethazine (PHENERGAN) 12.5 MG tablet Take 12.5 mg by mouth every 6 (six) hours as needed for nausea or vomiting.   Yes Historical Provider, MD  traMADol (ULTRAM) 50 MG tablet Take 25 mg by mouth every 12 (twelve) hours as needed for moderate pain.   Yes Historical Provider, MD  traZODone (DESYREL) 50 MG tablet Take 0.5-1 tablets (25-50 mg total) by mouth at bedtime. Patient taking differently: Take 25 mg by mouth at bedtime.  11/01/14  Yes [name], MD  warfarin (COUMADIN) 2.5 MG tablet Take 2.5 mg by mouth at bedtime.    Yes Historical Provider, MD  cyanocobalamin (,VITAMIN B-12,) 1000 MCG/ML injection Inject 1,000 mcg into the muscle every 30 (thirty) days. Start 10/2014    [name], MD   Allergies  Allergen Reactions  . Other Other (See Comments)    Pt states that she is allergic to Midrin and it causes headaches.      FAMILY HISTORY   Family History  Problem Relation Age of Onset  . Deep vein thrombosis Brother   . Cancer Mother 88    colon  . Cancer Father     prostate  . Hypertension Father   . Hypertension Brother   . Stroke Mother   . Diabetes Neg Hx   . CAD Neg Hx   . Cancer Daughter 51    breast  .  Dementia Father 59      SOCIAL HISTORY    reports that she quit smoking about 5 years ago. She started smoking about 36 years ago. She has never used smokeless tobacco. She reports that she does not drink alcohol or use illicit drugs.  Review of Systems  Constitutional: Positive for malaise/fatigue and diaphoresis. Negative for fever, chills and weight loss.  HENT: Negative for congestion and hearing loss.   Eyes: Negative for blurred vision and double vision.  Respiratory: Positive for cough, shortness of breath and wheezing.   Cardiovascular:  Negative for chest pain, palpitations and orthopnea.  Gastrointestinal: Positive for blood in stool. Negative for heartburn, nausea, vomiting, abdominal pain, diarrhea and constipation.  Genitourinary: Negative for dysuria and urgency.  Musculoskeletal: Negative for myalgias, back pain and neck pain.  Skin: Negative for rash.  Neurological: Positive for weakness. Negative for dizziness, tingling, tremors and headaches.  Endo/Heme/Allergies: Does not bruise/bleed easily.  Psychiatric/Behavioral: Negative for depression, suicidal ideas and substance abuse.  All other systems reviewed and are negative.     VITAL SIGNS    Temp:  [97.6 F (36.4 C)-101 F (38.3 C)] 97.8 F (36.6 C) (07/26 1234) Pulse Rate:  [86-116] 103 (07/26 1234) Resp:  [15-31] 22 (07/26 1234) BP: (92-140)/(44-83) 98/59 mmHg (07/26 1234) SpO2:  [89 %-100 %] 93 % (07/26 1234) FiO2 (%):  [50 %-100 %] 53 % (07/26 0904) Weight:  [233 lb 0.4 oz (105.7 kg)-238 lb 1.6 oz (108 kg)] 233 lb 0.4 oz (105.7 kg) (07/26 1234) HEMODYNAMICS:   VENTILATOR SETTINGS: Vent Mode:  [-]  FiO2 (%):  [50 %-100 %] 53 % INTAKE / OUTPUT:  Intake/Output Summary (Last 24 hours) at 11/13/14 1447 Last data filed at 11/13/14 1234  Gross per 24 hour  Intake   1177 ml  Output   3000 ml  Net  -1823 ml       PHYSICAL EXAM   Physical Exam  Constitutional: She is oriented to person, place,  and time. She appears well-developed. She appears distressed.  HENT:  Head: Normocephalic and atraumatic.  Eyes: Conjunctivae are normal. Pupils are equal, round, and reactive to light. No scleral icterus.  Neck: Normal range of motion. Neck supple.  Cardiovascular: Normal rate and regular rhythm.   Murmur heard. Pulmonary/Chest: She is in respiratory distress. She has wheezes. She has rales.  Abdominal: Soft. Bowel sounds are normal. She exhibits no distension. There is no tenderness. There is no rebound.  Musculoskeletal: Normal range of motion. She exhibits no edema or tenderness.  Neurological: She is alert and oriented to person, place, and time. She displays normal reflexes. No cranial nerve deficit. Coordination normal.  Skin: Skin is warm. No rash noted. She is diaphoretic. No erythema.  Psychiatric: She has a normal mood and affect. Her behavior is normal.       LABS   LABS:  CBC  Recent Labs Lab 11/09/14 1132  11/10/14 0538 11/10/14 0941 11/10/14 1542 11/11/14 0641 11/12/14 0633 11/13/14 0736  WBC 6.6  --  4.9  --   --  6.0  --   --   HGB 6.4*  < > 7.1* 7.4* 6.8* 8.1* 7.7* 8.1*  HCT 18.9*  < > 21.1* 21.3* 20.1* 23.9*  --   --   PLT 196  --  136*  --   --  132*  --   --   < > = values in this interval not displayed. Coag's  Recent Labs Lab 11/10/14 0538  11/10/14 2242 11/11/14 0641 11/12/14 0633  APTT 32  --   --   --   --   INR 1.20  < > 1.09 1.07 1.10  < > = values in this interval not displayed. BMET  Recent Labs Lab 11/10/14 0538 11/12/14 0640 11/13/14 0736  NA 140 141 136  K 4.1 4.5 4.6  CL 101 101 98*  CO2 30 30 26   BUN 41* 27* 50*  CREATININE 4.27* 4.55* 6.57*  GLUCOSE 83 98 172*   Electrolytes  Recent Labs Lab 11/10/14 0538 11/12/14 0640 11/13/14  9604  CALCIUM 7.6* 8.0* 8.0*  PHOS  --  4.0 4.9*   Sepsis Markers No results for input(s): LATICACIDVEN, PROCALCITON, O2SATVEN in the last 168 hours. ABG No results for input(s):  PHART, PCO2ART, PO2ART in the last 168 hours. Liver Enzymes  Recent Labs Lab 11/09/14 1132 11/12/14 0640 11/13/14 0736  AST 25  --   --   ALT 18  --   --   ALKPHOS 58  --   --   BILITOT 0.4  --   --   ALBUMIN 3.2* 2.8* 2.9*   Cardiac Enzymes No results for input(s): TROPONINI, PROBNP in the last 168 hours. Glucose No results for input(s): GLUCAP in the last 168 hours.   Recent Results (from the past 240 hour(s))  MRSA PCR Screening     Status: None   Collection Time: 11/09/14  9:40 PM  Result Value Ref Range Status   MRSA by PCR NEGATIVE NEGATIVE Final    Comment:        The GeneXpert MRSA Assay (FDA approved for NASAL specimens only), is one component of a comprehensive MRSA colonization surveillance program. It is not intended to diagnose MRSA infection nor to guide or monitor treatment for MRSA infections.      Current facility-administered medications:  .  0.9 %  sodium chloride infusion, 100 mL, Intravenous, PRN, Munsoor Lateef, MD .  0.9 %  sodium chloride infusion, 100 mL, Intravenous, PRN, Munsoor Lateef, MD .  albuterol (PROVENTIL) (2.5 MG/3ML) 0.083% nebulizer solution 2.5 mg, 2.5 mg, Nebulization, Q6H PRN, Ramonita Lab, MD, 2.5 mg at 11/12/14 1010 .  benzonatate (TESSALON) capsule 100 mg, 100 mg, Oral, Q6H PRN, Shaune Pollack, MD, 100 mg at 11/11/14 5409 .  budesonide (PULMICORT) nebulizer solution 0.25 mg, 0.25 mg, Nebulization, BID, Erin Fulling, MD, 0.25 mg at 11/13/14 1411 .  budesonide-formoterol (SYMBICORT) 160-4.5 MCG/ACT inhaler 2 puff, 2 puff, Inhalation, BID, Ramonita Lab, MD, 2 puff at 11/13/14 1342 .  calcitRIOL (ROCALTROL) capsule 0.25 mcg, 0.25 mcg, Oral, Daily, Erin Fulling, MD, 0.25 mcg at 11/13/14 1332 .  chlorpheniramine-HYDROcodone (TUSSIONEX) 10-8 MG/5ML suspension 5 mL, 5 mL, Oral, Q12H PRN, Alford Highland, MD, 5 mL at 11/12/14 2016 .  [START ON 11/14/2014] cinacalcet (SENSIPAR) tablet 60 mg, 60 mg, Oral, Q breakfast, Erin Fulling, MD .   donepezil (ARICEPT) tablet 5 mg, 5 mg, Oral, QHS, Ramonita Lab, MD, 5 mg at 11/12/14 2106 .  feeding supplement (NEPRO CARB STEADY) liquid 237 mL, 237 mL, Oral, q morning - 10a, Lamont Dowdy, MD, 237 mL at 11/13/14 1224 .  hydrOXYzine (ATARAX/VISTARIL) tablet 25 mg, 25 mg, Oral, TID PRN, Ramonita Lab, MD, 25 mg at 11/12/14 2107 .  ipratropium-albuterol (DUONEB) 0.5-2.5 (3) MG/3ML nebulizer solution 3 mL, 3 mL, Nebulization, Q6H, Erin Fulling, MD, 3 mL at 11/13/14 1441 .  lidocaine (PF) (XYLOCAINE) 1 % injection 5 mL, 5 mL, Intradermal, PRN, Munsoor Lateef, MD .  lidocaine-prilocaine (EMLA) cream 1 application, 1 application, Topical, PRN, Munsoor Lateef, MD, 1 application at 11/13/14 0830 .  menthol-cetylpyridinium (CEPACOL) lozenge 3 mg, 1 lozenge, Oral, PRN, Shaune Pollack, MD, 3 mg at 11/10/14 2305 .  [START ON 11/14/2014] methylPREDNISolone sodium succinate (SOLU-MEDROL) 40 mg/mL injection 40 mg, 40 mg, Intravenous, Daily, Erin Fulling, MD .  multivitamin with minerals tablet 1 tablet, 1 tablet, Oral, Daily, Ramonita Lab, MD, 1 tablet at 11/13/14 1332 .  pantoprazole (PROTONIX) injection 40 mg, 40 mg, Intravenous, Q12H, Shane Crutch, MD, 40 mg at 11/13/14 1331 .  pentafluoroprop-tetrafluoroeth (  GEBAUERS) aerosol 1 application, 1 application, Topical, PRN, Munsoor Lateef, MD .  piperacillin-tazobactam (ZOSYN) IVPB 3.375 g, 3.375 g, Intravenous, Q12H, Lamont Dowdy, MD, 3.375 g at 11/13/14 1331 .  sevelamer carbonate (RENVELA) tablet 1,600 mg, 1,600 mg, Oral, TID BM PRN, Ramonita Lab, MD, 1,600 mg at 11/10/14 0806 .  sevelamer carbonate (RENVELA) tablet 2,400 mg, 2,400 mg, Oral, TID WC, Aruna Gouru, MD, 2,400 mg at 11/13/14 1332 .  traMADol (ULTRAM) tablet 25 mg, 25 mg, Oral, Q12H PRN, [name], MD .  traMADol Janean Sark) tablet 50 mg, 50 mg, Oral, Q12H PRN, Ramonita Lab, MD, 50 mg at 11/09/14 2330 .  traZODone (DESYREL) tablet 25-50 mg, 25-50 mg, Oral, QHS, Ramonita Lab, MD, 25 mg at 11/12/14 2108 .   vancomycin (VANCOCIN) IVPB 750 mg/150 ml premix, 750 mg, Intravenous, Q T,Th,Sa-HD, Alford Highland, MD, 750 mg at 11/13/14 1224 .  zolpidem (AMBIEN) tablet 5 mg, 5 mg, Oral, QHS PRN, Gracelyn Nurse, MD, 5 mg at 11/12/14 2106  IMAGING    No results found.      ASSESSMENT/PLAN   69 yo obese white female admitted to ICU for acute and severe resp failure from multifocal pneumonia and pulm edema with acute COPD exacerbation  PULMONARY-COPD exacerbation with pneumonia resp status very tenious- -continue oxygen as needed, BiPAP if needed -start albuterol nebs -start pulmicort nebs -start inhaled steroids  CARDIOVASCULAR Needs ICU monitoring  RENAL ESRD on HD -follow nephrology recs -ffollow chem 7  GIB-follow up Gi recs  HEMATOLOGIC Follow h/j -transfusion as needed  INFECTIOUS Multifocal pneumonia -obtain sputum cultures   ENDOCRINE - ICU hypoglycemic\Hyperglycemia protocol   I have personally obtained a history, examined the patient, evaluated laboratory and independently reviewed  imaging results, formulated the assessment and plan and placed orders.  The Patient requires high complexity decision making for assessment and support, frequent evaluation and titration of therapies, application of advanced monitoring technologies and extensive interpretation of multiple databases. Critical Care Time devoted to patient care services described in this note is 50 minutes.   Overall, patient is critically ill, prognosis is guarded. Patient at high risk for cardiac arrest and death.    Lucie Leather, M.D.  [facility]  Medical Director [facility] Medical Director [facility] Cardio-Pulmonary Department

## 2014-11-13 NOTE — Progress Notes (Signed)
[facility]  Daily Progress Note   Subjective  - 1 Day Post-Op  Breathing a little better this am HR lower Still coughing a lot No problems after filter placement last night   Objective Filed Vitals:   11/13/14 1200 11/13/14 1215 11/13/14 1230 11/13/14 1234  BP: 92/56 102/47 95/57 98/59   Pulse: 99 99 98 103  Temp:    97.8 F (36.6 C)  TempSrc:    Oral  Resp: 21 21 21 22   Height:      Weight:    233 lb 0.4 oz (105.7 kg)  SpO2: 93% 91% 92% 93%    Intake/Output Summary (Last 24 hours) at 11/13/14 1319 Last data filed at 11/13/14 1234  Gross per 24 hour  Intake   1177 ml  Output   3000 ml  Net  -1823 ml    PULM  Coarse and labored,  But better than yesterday CV  RRR.  HR now in 90s VASC  Access site C/D/I  Laboratory CBC    Component Value Date/Time   WBC 6.0 11/11/2014 0641   WBC 8.0 01/11/2014 1559   HGB 8.1* 11/13/2014 0736   HGB 9.0* 01/11/2014 1559   HGB 9.0 06/02/2013   HCT 23.9* 11/11/2014 0641   HCT 27.0* 01/11/2014 1559   PLT 132* 11/11/2014 0641   PLT 149* 01/15/2014 0510    BMET    Component Value Date/Time   NA 136 11/13/2014 0736   NA 142 01/11/2014 1559   NA 141 08/10/2011   K 4.6 11/13/2014 0736   K 3.7 01/11/2014 1559   K 4.3 08/10/2011   CL 98* 11/13/2014 0736   CL 109* 01/11/2014 1559   CO2 26 11/13/2014 0736   CO2 24 01/11/2014 1559   GLUCOSE 172* 11/13/2014 0736   GLUCOSE 106* 01/11/2014 1559   BUN 50* 11/13/2014 0736   BUN 42* 01/11/2014 1559   CREATININE 6.57* 11/13/2014 0736   CREATININE 6.17* 03/30/2014 1724   CREATININE 6.27* 01/11/2014 1559   CALCIUM 8.0* 11/13/2014 0736   CALCIUM 8.5 01/11/2014 1559   CALCIUM 8.4 06/02/2013   GFRNONAA 6* 11/13/2014 0736   GFRNONAA 5* 11/21/2013 0027   GFRAA 7* 11/13/2014 0736   GFRAA 5* 11/21/2013 0027    Assessment/Planning: POD #1 s/p IVC filter placement for GI bleed and DVT   Doing better this am  No access site problems.    Follow up in office  in 4-6 weeks to discuss timing of filter retrieval     DEW,JASON  11/13/2014, 1:19 PM

## 2014-11-13 NOTE — Progress Notes (Signed)
Pre-hd tx 

## 2014-11-13 NOTE — Progress Notes (Signed)
   11/13/14 1434  Clinical Encounter Type  Visited With Patient and family together  Visit Type Initial  Consult/Referral To Chaplain  Spiritual Encounters  Spiritual Needs Emotional  Stress Factors  Patient Stress Factors None identified  Family Stress Factors None identified  Chaplain rounded in the unit and offered a compassionate presence and spiritual support as applicable. Chaplain Neena Beecham A. Daisie Haft Ext. [phone]

## 2014-11-14 ENCOUNTER — Encounter: Payer: Self-pay | Admitting: Vascular Surgery

## 2014-11-14 ENCOUNTER — Inpatient Hospital Stay: Payer: Medicare Other

## 2014-11-14 LAB — HEMOGLOBIN: Hemoglobin: 7.3 g/dL — ABNORMAL LOW (ref 12.0–16.0)

## 2014-11-14 MED ORDER — NEPRO/CARBSTEADY PO LIQD
237.0000 mL | Freq: Two times a day (BID) | ORAL | Status: DC
  Administered 2014-11-15 – 2014-11-22 (×11): 237 mL via ORAL

## 2014-11-14 MED ORDER — PRAVASTATIN SODIUM 20 MG PO TABS
20.0000 mg | ORAL_TABLET | Freq: Every day | ORAL | Status: DC
  Administered 2014-11-15 – 2014-11-21 (×7): 20 mg via ORAL
  Filled 2014-11-14 (×7): qty 1

## 2014-11-14 MED ORDER — CITALOPRAM HYDROBROMIDE 20 MG PO TABS
20.0000 mg | ORAL_TABLET | Freq: Every day | ORAL | Status: DC
  Administered 2014-11-14 – 2014-11-22 (×9): 20 mg via ORAL
  Filled 2014-11-14 (×9): qty 1

## 2014-11-14 NOTE — Consult Note (Signed)
PULMONARY / CRITICAL CARE MEDICINE   Name: Ariel Smith MRN: [id number] DOB: 10-03-1945    ADMISSION DATE:  11/09/2014    CHIEF COMPLAINT:   Follow up SOB and resp distress   HISTORY OF PRESENT ILLNESS   Patient remains SOB slighly better than yesterday, tolerating BD therapy On steroids and ABX  I had Discussion of code status with patient and she would like to think about it      PAST MEDICAL HISTORY    :  Past Medical History  Diagnosis Date  . Osteoarthritis   . Depression   . Frequent headaches   . HTN (hypertension)   . HLD (hyperlipidemia)   . History of colon polyps 2013    colonoscopy ([name])  . 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 Systolic murmur     mild  . GERD (gastroesophageal reflux disease)   . COPD (chronic obstructive pulmonary disease)   . ESRD (end stage renal disease) 08/2011    TThSa, L forearm AV fistula, [name]  . Secondary hyperparathyroidism of renal origin   . Osteopenia 01/2013  . Bronchiectasis 08/2014     suggested by thoracic xray   Past Surgical History  Procedure Laterality Date  . Cholecystectomy  2012  . Bunionectomy Left 2003  . Replacement total knee Right 2006  . Shoulder arthroscopy Right 2009  . Colonoscopy  08/2011    colon biopsies, [name]  . Spirometry  04/2011    WNL  . US echocardiography  06/2008    EF >55%  . Esophagogastroduodenoscopy  08/2011    gastric cardia polyp  . Tonsillectomy  1955  . Tubal ligation  1980  . Exteriorization of a continuous ambulatory peritoneal dialysis catheter  01/2013    removal 12/2103 - [name]  . Dexa  01/2013    osteopenia with -2.0 at hip and spine  . Hospitalization  12/2013    recurrent R pleural effusion due to peritoneal fluid translocation s/p rpt thoracentesis with 1.3 L fluid removed, ERSD started on HD this hospitalization  . US echocardiography  12/2013    EF  55-60%, nl LV sys fxn, mild-mod MR, AS, increased LV posterior wall thickness, mild TR  . Esophagogastroduodenoscopy Left 11/11/2014    Procedure: ESOPHAGOGASTRODUODENOSCOPY (EGD);  Surgeon: [name], MD;  Location: [facility] ENDOSCOPY;  Service: Endoscopy;  Laterality: Left;  . Peripheral vascular catheterization N/A 11/12/2014    Procedure: IVC Filter Insertion;  Surgeon: [name], MD;  Location: [facility] INVASIVE CV LAB;  Service: Cardiovascular;  Laterality: N/A;   Prior to Admission medications   Medication Sig Start Date End Date Taking? Authorizing Provider  acetaminophen (TYLENOL) 500 MG tablet Take 1,000 mg by mouth every 6 (six) hours as needed for mild pain or headache.    Yes Historical Provider, MD  albuterol (VENTOLIN HFA) 108 (90 BASE) MCG/ACT inhaler Inhale 2 puffs into the lungs every 6 (six) hours as needed for wheezing or shortness of breath.    Yes Historical Provider, MD  baclofen (LIORESAL) 10 MG tablet Take 5-10 mg by mouth 2 (two) times daily as needed for muscle spasms.   Yes Historical Provider, MD  budesonide-formoterol (SYMBICORT) 160-4.5 MCG/ACT inhaler Inhale 2 puffs into the lungs 2 (two) times daily.   Yes Historical Provider, MD  calcitRIOL (ROCALTROL) 0.25 MCG capsule Take 0.25 mcg by mouth daily.  Yes Historical Provider, MD  calcium acetate (PHOSLO) 667 MG capsule Take 2,001 mg by mouth 3 (three) times daily with meals.    Yes Historical Provider, MD  cinacalcet (SENSIPAR) 60 MG tablet Take 60 mg by mouth daily.   Yes Historical Provider, MD  citalopram (CELEXA) 20 MG tablet Take 1 tablet (20 mg total) by mouth daily. 03/30/14  Yes [name], MD  donepezil (ARICEPT) 5 MG tablet Take 1 tablet (5 mg total) by mouth at bedtime. Patient taking differently: Take 2.5 mg by mouth at bedtime.  11/01/14  Yes [name], MD  hydrOXYzine (ATARAX/VISTARIL) 25 MG tablet Take 12.5-25 mg by mouth 2 (two) times daily as needed for anxiety.   Yes Historical Provider, MD   lovastatin (MEVACOR) 20 MG tablet Take 20 mg by mouth at bedtime.   Yes Historical Provider, MD  pantoprazole (PROTONIX) 40 MG tablet Take 40 mg by mouth daily as needed (for heartburn/indigestion).   Yes Historical Provider, MD  promethazine (PHENERGAN) 12.5 MG tablet Take 12.5 mg by mouth every 6 (six) hours as needed for nausea or vomiting.   Yes Historical Provider, MD  traMADol (ULTRAM) 50 MG tablet Take 25 mg by mouth every 12 (twelve) hours as needed for moderate pain.   Yes Historical Provider, MD  traZODone (DESYREL) 50 MG tablet Take 0.5-1 tablets (25-50 mg total) by mouth at bedtime. Patient taking differently: Take 25 mg by mouth at bedtime.  11/01/14  Yes [name], MD  warfarin (COUMADIN) 2.5 MG tablet Take 2.5 mg by mouth at bedtime.    Yes Historical Provider, MD  cyanocobalamin (,VITAMIN B-12,) 1000 MCG/ML injection Inject 1,000 mcg into the muscle every 30 (thirty) days. Start 10/2014    [name], MD   Allergies  Allergen Reactions  . Other Other (See Comments)    Pt states that she is allergic to Midrin and it causes headaches.      FAMILY HISTORY   Family History  Problem Relation Age of Onset  . Deep vein thrombosis Brother   . Cancer Mother 74    colon  . Cancer Father     prostate  . Hypertension Father   . Hypertension Brother   . Stroke Mother   . Diabetes Neg Hx   . CAD Neg Hx   . Cancer Daughter 19    breast  . Dementia Father 22      SOCIAL HISTORY    reports that she quit smoking about 5 years ago. She started smoking about 36 years ago. She has never used smokeless tobacco. She reports that she does not drink alcohol or use illicit drugs.  Review of Systems  Constitutional: Positive for malaise/fatigue. Negative for fever, chills, weight loss and diaphoresis.  HENT: Negative for congestion and hearing loss.   Eyes: Negative for blurred vision and double vision.  Respiratory: Positive for cough, shortness of breath and wheezing.    Cardiovascular: Negative for chest pain, palpitations and orthopnea.  Gastrointestinal: Negative for heartburn, nausea, vomiting, abdominal pain, diarrhea, constipation and blood in stool.  Genitourinary: Negative for dysuria and urgency.  Musculoskeletal: Negative for myalgias, back pain and neck pain.  Skin: Negative for rash.  Neurological: Positive for weakness. Negative for dizziness, tingling, tremors and headaches.  Endo/Heme/Allergies: Does not bruise/bleed easily.  Psychiatric/Behavioral: Negative for depression, suicidal ideas and substance abuse.  All other systems reviewed and are negative.     VITAL SIGNS    Temp:  [98 F (36.7 C)-98.5 F (36.9 C)]  98.5 F (36.9 C) (07/27 1200) Pulse Rate:  [95-114] 112 (07/27 1430) Resp:  [14-38] 27 (07/27 1430) BP: (92-148)/(41-107) 136/70 mmHg (07/27 1430) SpO2:  [85 %-94 %] 89 % (07/27 1430) FiO2 (%):  [40 %-50 %] 40 % (07/27 0957) HEMODYNAMICS:   VENTILATOR SETTINGS: Vent Mode:  [-]  FiO2 (%):  [40 %-50 %] 40 % INTAKE / OUTPUT:  Intake/Output Summary (Last 24 hours) at 11/14/14 1434 Last data filed at 11/14/14 0958  Gross per 24 hour  Intake    340 ml  Output      0 ml  Net    340 ml       PHYSICAL EXAM   Physical Exam  Constitutional: She is oriented to person, place, and time. She appears well-developed. No distress.  HENT:  Head: Normocephalic and atraumatic.  Eyes: Conjunctivae are normal. Pupils are equal, round, and reactive to light. No scleral icterus.  Neck: Normal range of motion. Neck supple.  Cardiovascular: Normal rate and regular rhythm.   Murmur heard. Pulmonary/Chest: She is in respiratory distress. She has wheezes. She has rales.  Abdominal: Soft. Bowel sounds are normal. She exhibits no distension. There is no tenderness. There is no rebound.  Musculoskeletal: Normal range of motion. She exhibits no edema or tenderness.  Neurological: She is alert and oriented to person, place, and time.  She displays normal reflexes. No cranial nerve deficit. Coordination normal.  Skin: Skin is warm. No rash noted. She is not diaphoretic. No erythema.  Psychiatric: She has a normal mood and affect. Her behavior is normal.       LABS   LABS:  CBC  Recent Labs Lab 11/09/14 1132  11/10/14 0538 11/10/14 0941 11/10/14 1542 11/11/14 0641 11/12/14 0633 11/13/14 0736 11/14/14 0505  WBC 6.6  --  4.9  --   --  6.0  --   --   --   HGB 6.4*  < > 7.1* 7.4* 6.8* 8.1* 7.7* 8.1* 7.3*  HCT 18.9*  < > 21.1* 21.3* 20.1* 23.9*  --   --   --   PLT 196  --  136*  --   --  132*  --   --   --   < > = values in this interval not displayed. Coag's  Recent Labs Lab 11/10/14 0538  11/10/14 2242 11/11/14 0641 11/12/14 0633  APTT 32  --   --   --   --   INR 1.20  < > 1.09 1.07 1.10  < > = values in this interval not displayed. BMET  Recent Labs Lab 11/10/14 0538 11/12/14 0640 11/13/14 0736  NA 140 141 136  K 4.1 4.5 4.6  CL 101 101 98*  CO2 30 30 26   BUN 41* 27* 50*  CREATININE 4.27* 4.55* 6.57*  GLUCOSE 83 98 172*   Electrolytes  Recent Labs Lab 11/10/14 0538 11/12/14 0640 11/13/14 0736  CALCIUM 7.6* 8.0* 8.0*  PHOS  --  4.0 4.9*   Sepsis Markers No results for input(s): LATICACIDVEN, PROCALCITON, O2SATVEN in the last 168 hours. ABG No results for input(s): PHART, PCO2ART, PO2ART in the last 168 hours. Liver Enzymes  Recent Labs Lab 11/09/14 1132 11/12/14 0640 11/13/14 0736  AST 25  --   --   ALT 18  --   --   ALKPHOS 58  --   --   BILITOT 0.4  --   --   ALBUMIN 3.2* 2.8* 2.9*   Cardiac Enzymes No results for input(s):  TROPONINI, PROBNP in the last 168 hours. Glucose No results for input(s): GLUCAP in the last 168 hours.   Recent Results (from the past 240 hour(s))  MRSA PCR Screening     Status: None   Collection Time: 11/09/14  9:40 PM  Result Value Ref Range Status   MRSA by PCR NEGATIVE NEGATIVE Final    Comment:        The GeneXpert MRSA Assay  (FDA approved for NASAL specimens only), is one component of a comprehensive MRSA colonization surveillance program. It is not intended to diagnose MRSA infection nor to guide or monitor treatment for MRSA infections.      Current facility-administered medications:  .  0.9 %  sodium chloride infusion, 100 mL, Intravenous, PRN, Munsoor Lateef, MD .  0.9 %  sodium chloride infusion, 100 mL, Intravenous, PRN, Munsoor Lateef, MD .  albuterol (PROVENTIL) (2.5 MG/3ML) 0.083% nebulizer solution 2.5 mg, 2.5 mg, Nebulization, Q6H PRN, Ramonita Lab, MD, 2.5 mg at 11/12/14 1010 .  benzonatate (TESSALON) capsule 100 mg, 100 mg, Oral, Q6H PRN, Shaune Pollack, MD, 100 mg at 11/11/14 1610 .  budesonide (PULMICORT) nebulizer solution 0.25 mg, 0.25 mg, Nebulization, BID, Erin Fulling, MD, 0.25 mg at 11/14/14 0800 .  budesonide-formoterol (SYMBICORT) 160-4.5 MCG/ACT inhaler 2 puff, 2 puff, Inhalation, BID, Ramonita Lab, MD, 2 puff at 11/14/14 0956 .  calcitRIOL (ROCALTROL) capsule 0.25 mcg, 0.25 mcg, Oral, Daily, Erin Fulling, MD, 0.25 mcg at 11/14/14 0955 .  chlorpheniramine-HYDROcodone (TUSSIONEX) 10-8 MG/5ML suspension 5 mL, 5 mL, Oral, Q12H PRN, Alford Highland, MD, 5 mL at 11/12/14 2016 .  cinacalcet (SENSIPAR) tablet 60 mg, 60 mg, Oral, Q breakfast, Erin Fulling, MD, 60 mg at 11/14/14 0817 .  citalopram (CELEXA) tablet 20 mg, 20 mg, Oral, Daily, Erin Fulling, MD, 20 mg at 11/14/14 1424 .  donepezil (ARICEPT) tablet 5 mg, 5 mg, Oral, QHS, Ramonita Lab, MD, 5 mg at 11/13/14 2136 .  feeding supplement (NEPRO CARB STEADY) liquid 237 mL, 237 mL, Oral, q morning - 10a, Lamont Dowdy, MD, 237 mL at 11/14/14 0959 .  hydrOXYzine (ATARAX/VISTARIL) tablet 25 mg, 25 mg, Oral, TID PRN, Ramonita Lab, MD, 25 mg at 11/14/14 0829 .  ipratropium-albuterol (DUONEB) 0.5-2.5 (3) MG/3ML nebulizer solution 3 mL, 3 mL, Nebulization, Q6H, Erin Fulling, MD, 3 mL at 11/14/14 0800 .  lidocaine (PF) (XYLOCAINE) 1 % injection 5 mL, 5 mL,  Intradermal, PRN, Munsoor Lateef, MD .  lidocaine-prilocaine (EMLA) cream 1 application, 1 application, Topical, PRN, Munsoor Lateef, MD, 1 application at 11/13/14 0830 .  menthol-cetylpyridinium (CEPACOL) lozenge 3 mg, 1 lozenge, Oral, PRN, Shaune Pollack, MD, 3 mg at 11/10/14 2305 .  methylPREDNISolone sodium succinate (SOLU-MEDROL) 40 mg/mL injection 40 mg, 40 mg, Intravenous, Daily, Erin Fulling, MD, 40 mg at 11/14/14 0958 .  multivitamin with minerals tablet 1 tablet, 1 tablet, Oral, Daily, Ramonita Lab, MD, 1 tablet at 11/14/14 0955 .  pantoprazole (PROTONIX) injection 40 mg, 40 mg, Intravenous, Q12H, Shane Crutch, MD, 40 mg at 11/14/14 0958 .  pentafluoroprop-tetrafluoroeth (GEBAUERS) aerosol 1 application, 1 application, Topical, PRN, Munsoor Lateef, MD .  piperacillin-tazobactam (ZOSYN) IVPB 3.375 g, 3.375 g, Intravenous, Q12H, Lamont Dowdy, MD, 3.375 g at 11/14/14 0958 .  [START ON 11/15/2014] pravastatin (PRAVACHOL) tablet 20 mg, 20 mg, Oral, q1800, Erin Fulling, MD .  sevelamer carbonate (RENVELA) tablet 1,600 mg, 1,600 mg, Oral, TID BM PRN, Ramonita Lab, MD, 1,600 mg at 11/10/14 0806 .  sevelamer carbonate (RENVELA) tablet 2,400 mg, 2,400 mg, Oral,  TID WC, Ramonita Lab, MD, 2,400 mg at 11/14/14 1144 .  traMADol (ULTRAM) tablet 25 mg, 25 mg, Oral, Q12H PRN, [name], MD, 25 mg at 11/14/14 1429 .  traMADol (ULTRAM) tablet 50 mg, 50 mg, Oral, Q12H PRN, Ramonita Lab, MD, 50 mg at 11/09/14 2330 .  traZODone (DESYREL) tablet 25-50 mg, 25-50 mg, Oral, QHS, Ramonita Lab, MD, 50 mg at 11/13/14 2136 .  vancomycin (VANCOCIN) IVPB 750 mg/150 ml premix, 750 mg, Intravenous, Q T,Th,Sa-HD, Alford Highland, MD, 750 mg at 11/13/14 1224 .  zolpidem (AMBIEN) tablet 5 mg, 5 mg, Oral, QHS PRN, Gracelyn Nurse, MD, 5 mg at 11/13/14 2136  IMAGING    Dg Chest 1 View  11/14/2014   CLINICAL DATA:  Central line placement  EXAM: CHEST  1 VIEW  COMPARISON:  11/12/2014  FINDINGS: Left internal jugular central  venous catheter place with its tip in the mid SVC and no left pneumothorax. Heart remains borderline enlarged. Bilateral airspace disease with a central and basilar distribution has worsened.  IMPRESSION: Left internal jugular central venous catheter place with its tip in the mid SVC and no pneumothorax.  Worsening bilateral airspace disease.   Electronically Signed   By: Jolaine Click M.D.   On: 11/14/2014 11:18        ASSESSMENT/PLAN   69 yo obese white female admitted to ICU for acute and severe resp failure from multifocal pneumonia and pulm edema with acute COPD exacerbation  PULMONARY-COPD exacerbation with pneumonia resp status slowly improving -continue oxygen as needed, BiPAP if needed -started albuterol nebs -started pulmicort nebs -started inhaled steroids  CARDIOVASCULAR Needs ICU monitoring  RENAL ESRD on HD -follow nephrology recs -ffollow chem 7  GIB-follow up Gi recs  HEMATOLOGIC Follow h/h -transfusion as needed  INFECTIOUS Multifocal pneumonia-cotninue vanc/zosyn/azithromycin day 3 -sputum cultures pending   ENDOCRINE - ICU hypoglycemic\Hyperglycemia protocol   I have personally obtained a history, examined the patient, evaluated laboratory and independently reviewed  imaging results, formulated the assessment and plan and placed orders.  The Patient requires high complexity decision making for assessment and support, frequent evaluation and titration of therapies, application of advanced monitoring technologies and extensive interpretation of multiple databases. 35 minutes spent with patient   Overall,prognosis is guarded.    Lucie Leather, M.D.  [facility]  Medical Director [facility] Medical Director [facility] Cardio-Pulmonary Department

## 2014-11-14 NOTE — Progress Notes (Signed)
HD tx completed.

## 2014-11-14 NOTE — Progress Notes (Signed)
HD tx start 

## 2014-11-14 NOTE — Care Management (Signed)
Plan to attempt to place patient on nasal canula; Palliative consult in place. Dialysis patient.

## 2014-11-14 NOTE — Op Note (Signed)
[facility]   PROCEDURE NOTE  PROCEDURE: 1. Left IJ central venous catheter placement 2. Left IJ cannulation under ultrasound guidance  PRE-OPERATIVE DIAGNOSIS: pneumonia, ESRD, GI bleed, poor venous access  POST-OPERATIVE DIAGNOSIS: same as above  SURGEON: Cadince Hilscher, MD  ANESTHESIA:  None  ESTIMATED BLOOD LOSS: minimal  FINDING(S): Right IJ occluded, left IJ patent  SPECIMEN(S):  none  INDICATIONS:   Ariel Smith is a 69 y.o. female who presents with need for venous access secondary to multiple issues as above and poor venous access.  The patient presents for central venous catheter placement.  The patient is aware the risks of central venous catheter placement include but are not limited to: bleeding, infection, central venous injury, pneumothorax, possible venous stenosis, possible malpositioning in the venous system, and possible infections related to long-term catheter presence. The patient was aware of these risks and agreed to proceed.  DESCRIPTION: After written informed consent was obtained from the patient and/or family, the patient was placed supine in the hospital bed.  The patient was prepped with chloraprep and draped in the standard fashion for a chest or neck central venous catheter placement.  I anesthesized the neck cannulation site with 1% lidocaine then under ultrasound guidance, the left IJ vein was cannulated with the 18 gauge needle.  A J wire was then placed down in the superior vena cava.  After a skin nick and dilatation, the triple lumen central venous catheter was placed over the wire and the wire was removed.  Each port was aspirated and flushed with sterile normal saline.  The catheter was secured in placed with three interrupted stitches of 3-0 Silk tied to the catheter at 18 cm.  The catheter was dressed with sterile dressing.  Stat CXR is pending  COMPLICATIONS: none apparent  CONDITION: stable  Ariel Smith 11/14/2014, 10:53  AM

## 2014-11-14 NOTE — Care Management Important Message (Signed)
Important Message  Patient Details  Name: Ariel Smith MRN: [id number] Date of Birth: 07/28/1945   Medicare Important Message Given:  Yes-third notification given    Olegario Messier A Allmond 11/14/2014, 11:18 AM

## 2014-11-14 NOTE — Progress Notes (Signed)
Initial Nutrition Assessment     INTERVENTION:   Medical Food Supplement Therapy: recommend increasing Nepro to BID Meals and Snacks: Cater to patient preferences   NUTRITION DIAGNOSIS:   No nutrition diagnosis at this time  GOAL:   Patient will meet greater than or equal to 90% of their needs  MONITOR:    (Energy Intake, Anthropometrics, Electrolyte/Renal Profile, Digestive System, Glucose Profile)  REASON FOR ASSESSMENT:    (Renal Diet, dialysis patient)    ASSESSMENT:    Pt admitted with acute GI Bleed, EGD with mild gastritis; acute pneumonia, currently on HFNC; pt very anxious, noted palliative care consulted  Diet Order:  Diet renal with fluid restriction Fluid restriction:: 1200 mL Fluid; Room service appropriate?: Yes; Fluid consistency:: Thin   Energy Intake: recorded po intake 50% of meal, limited documentation. Pt also receiving Nepro daily; reports appetite good. Previously on Liquid diet  Electrolyte and Renal Profile:  Recent Labs Lab 11/10/14 0538 11/12/14 0640 11/13/14 0736  BUN 41* 27* 50*  CREATININE 4.27* 4.55* 6.57*  NA 140 141 136  K 4.1 4.5 4.6  PHOS  --  4.0 4.9*   Glucose Profile: No results for input(s): GLUCAP in the last 72 hours. Protein Profile:  Recent Labs Lab 11/09/14 1132 11/12/14 0640 11/13/14 0736  ALBUMIN 3.2* 2.8* 2.9*   Meds: sensipar, MVI, solumedrol, renvela  Skin:  Reviewed, no issues  Last BM:   +small BM 7/25  Height:   Ht Readings from Last 1 Encounters:  11/09/14 5\' 4"  (1.626 m)    Weight:   Wt Readings from Last 1 Encounters:  11/13/14 233 lb 0.4 oz (105.7 kg)    Noted weight trend below; weight up and down but between 200-230 pounds (back and forth over the past year)l of note, pt receives dialysis, do not know dry weight  Wt Readings from Last 10 Encounters:  11/13/14 233 lb 0.4 oz (105.7 kg)  11/01/14 217 lb 4 oz (98.544 kg)  08/17/14 208 lb 8 oz (94.575 kg)  03/30/14 209 lb (94.802 kg)   12/04/13 200 lb 12 oz (91.06 kg)  08/02/13 214 lb (97.07 kg)  01/27/13 219 lb 4 oz (99.451 kg)  10/20/12 225 lb (102.059 kg)     BMI:  Body mass index is 39.98 kg/(m^2).  LOW Care Level  Romelle Starcher MS, [place], LDN [phone] Pager

## 2014-11-14 NOTE — Progress Notes (Signed)
[facility]  ROUNDING NOTE   Subjective:   Continues on HFNC Hemodialysis yesterday. Tolerated treatment. UF 3 liters  Objective:  Vital signs in last 24 hours:  Temp:  [97.6 F (36.4 C)-98.4 F (36.9 C)] 98.4 F (36.9 C) (07/27 0400) Pulse Rate:  [95-114] 100 (07/27 0400) Resp:  [14-28] 21 (07/27 0400) BP: (92-131)/(41-107) 125/89 mmHg (07/27 0400) SpO2:  [89 %-99 %] 93 % (07/27 0400) FiO2 (%):  [40 %-53 %] 50 % (07/27 0204) Weight:  [105.7 kg (233 lb 0.4 oz)-108 kg (238 lb 1.6 oz)] 105.7 kg (233 lb 0.4 oz) (07/26 1234)  Weight change:  Filed Weights   11/09/14 1129 11/13/14 0900 11/13/14 1234  Weight: 98.431 kg (217 lb) 108 kg (238 lb 1.6 oz) 105.7 kg (233 lb 0.4 oz)    Intake/Output: I/O last 3 completed shifts: In: 777 [P.O.:240; NG/GT:237; IV Piggyback:300] Out: 3000 [Other:3000]   Intake/Output this shift:     Physical Exam: General: Critically ill appearing  Head: Normocephalic, atraumatic. HFNC  Eyes: Anicteric  Neck: Supple, trachea midline  Lungs:  diminished  Heart: regular  Abdomen:  Soft, nontender, BS present  Extremities:  no peripheral edema.  Neurologic: Nonfocal, moving all four extremities  Skin: No lesions  Access: LUE AVF    Basic Metabolic Panel:  Recent Labs Lab 11/09/14 1132 11/10/14 0538 11/12/14 0640 11/13/14 0736  NA 138 140 141 136  K 4.2 4.1 4.5 4.6  CL 98* 101 101 98*  CO2 27 30 30 26   GLUCOSE 142* 83 98 172*  BUN 59* 41* 27* 50*  CREATININE 5.49* 4.27* 4.55* 6.57*  CALCIUM 7.9* 7.6* 8.0* 8.0*  PHOS  --   --  4.0 4.9*    Liver Function Tests:  Recent Labs Lab 11/09/14 1132 11/12/14 0640 11/13/14 0736  AST 25  --   --   ALT 18  --   --   ALKPHOS 58  --   --   BILITOT 0.4  --   --   PROT 5.9*  --   --   ALBUMIN 3.2* 2.8* 2.9*   No results for input(s): LIPASE, AMYLASE in the last 168 hours. No results for input(s): AMMONIA in the last 168 hours.  CBC:  Recent Labs Lab 11/09/14 1132   11/09/14 2108 11/10/14 0538 11/10/14 0941 11/10/14 1542 11/11/14 0641 11/12/14 0633 11/13/14 0736 11/14/14 0505  WBC 6.6  --   --  4.9  --   --  6.0  --   --   --   HGB 6.4*  < > 7.6* 7.1* 7.4* 6.8* 8.1* 7.7* 8.1* 7.3*  HCT 18.9*  < > 21.9* 21.1* 21.3* 20.1* 23.9*  --   --   --   MCV 103.7*  --   --  95.3  --   --  96.5  --   --   --   PLT 196  --   --  136*  --   --  132*  --   --   --   < > = values in this interval not displayed.  Cardiac Enzymes: No results for input(s): CKTOTAL, CKMB, CKMBINDEX, TROPONINI in the last 168 hours.  BNP: Invalid input(s): POCBNP  CBG: No results for input(s): GLUCAP in the last 168 hours.  Microbiology: Results for orders placed or performed during the hospital encounter of 11/09/14  MRSA PCR Screening     Status: None   Collection Time: 11/09/14  9:40 PM  Result Value Ref  Range Status   MRSA by PCR NEGATIVE NEGATIVE Final    Comment:        The GeneXpert MRSA Assay (FDA approved for NASAL specimens only), is one component of a comprehensive MRSA colonization surveillance program. It is not intended to diagnose MRSA infection nor to guide or monitor treatment for MRSA infections.     Coagulation Studies:  Recent Labs  11/12/14 0633  LABPROT 14.4  INR 1.10    Urinalysis: No results for input(s): COLORURINE, LABSPEC, PHURINE, GLUCOSEU, HGBUR, BILIRUBINUR, KETONESUR, PROTEINUR, UROBILINOGEN, NITRITE, LEUKOCYTESUR in the last 72 hours.  Invalid input(s): APPERANCEUR    Imaging: Ct Chest Wo Contrast  11/12/2014   CLINICAL DATA:  Exertional dyspnea.  EXAM: CT CHEST WITHOUT CONTRAST  TECHNIQUE: Multidetector CT imaging of the chest was performed following the standard protocol without IV contrast.  COMPARISON:  CT scan of August 15, 2013.  FINDINGS: No pneumothorax is noted. Mild bilateral pleural effusions are noted. Airspace opacity is noted in the upper and lower lobes bilaterally consistent with pneumonia or possibly edema.  Atherosclerosis of thoracic aorta is noted without aneurysm formation. No significant mediastinal mass or adenopathy is noted. No significant osseous abnormality is noted. Visualized portion of upper abdomen demonstrates severe bilateral renal atrophy. Stable exophytic calcified abnormality is seen arising from right hepatic lobe consistent with benign abnormality.  IMPRESSION: Bilateral upper lobe and lower lobe opacities are noted consistent with pneumonia or edema. Mild bilateral pleural effusions are noted.   Electronically Signed   By: Lupita Raider, M.D.   On: 11/12/2014 12:23     Medications:     . budesonide (PULMICORT) nebulizer solution  0.25 mg Nebulization BID  . budesonide-formoterol  2 puff Inhalation BID  . calcitRIOL  0.25 mcg Oral Daily  . cinacalcet  60 mg Oral Q breakfast  . donepezil  5 mg Oral QHS  . feeding supplement (NEPRO CARB STEADY)  237 mL Oral q morning - 10a  . ipratropium-albuterol  3 mL Nebulization Q6H  . methylPREDNISolone (SOLU-MEDROL) injection  40 mg Intravenous Daily  . multivitamin with minerals  1 tablet Oral Daily  . pantoprazole (PROTONIX) IV  40 mg Intravenous Q12H  . piperacillin-tazobactam (ZOSYN)  IV  3.375 g Intravenous Q12H  . sevelamer carbonate  2,400 mg Oral TID WC  . traZODone  25-50 mg Oral QHS  . vancomycin  750 mg Intravenous Q T,Th,Sa-HD   sodium chloride, sodium chloride, albuterol, benzonatate, chlorpheniramine-HYDROcodone, hydrOXYzine, lidocaine (PF), lidocaine-prilocaine, menthol-cetylpyridinium, pentafluoroprop-tetrafluoroeth, sevelamer carbonate, traMADol, traMADol, zolpidem  Assessment/ Plan:  69 y.o. female with past medical history of ESRD on HD TTHS, SHPTH, AOCD, HTN, hyperlipidemia, COPD, DVT with PE, tx with long term coumadin and IVC filter placement, L knee TKA, R shoulder surgery  [facility] T[facility] [address]  1. End-stage renal disease on hemodialysis: seen and examined on hemodialysis . UF of goal 2 litre.  Tolerating procedure well.  Monitor daily for dialysis need. No acute indication for dialysis.   2. Anemia of chronic kidney disease/anemia of blood loss. Hemoglobin 8.1. The patient was having black tarry stools at home. Gastritis on EGD - status post transfusion PRBC on 7/22 - epo with HD.   3. Secondary hyperparathyroidism. Continue renvela  po tid/wm. - PTH 713 outpatient, not well cotnrolled.  - on calcitriol and cinacalcet.   4. Acute exacerbation of COPD with Respiratory Distress: improved clinically - solumedrol, empiric vanc and zosyn.  - nebs - Continue oxygen.   5. Actue DVT: with  history of DVT. Status post IVC placed 7/25 [name].  - was on warfarin in the past.  - currently not on anticoagulation due to active GI bleed.    LOS: 5 Ariel Smith 7/27/20167:59 AM

## 2014-11-14 NOTE — Progress Notes (Signed)
ANTIBIOTIC CONSULT NOTE - FOLLOW UP 3 Pharmacy Consult for Vancomycin/Zosyn Indication: rule out pneumonia  Allergies  Allergen Reactions  . Other Other (See Comments)    Pt states that she is allergic to Midrin and it causes headaches.     Patient Measurements: Height: 5\' 4"  (162.6 cm) Weight: 233 lb 0.4 oz (105.7 kg) IBW/kg (Calculated) : 54.7  Vital Signs: Temp: 98.5 F (36.9 C) (07/27 1200) Temp Source: Oral (07/27 1200) BP: 131/77 mmHg (07/27 1345) Pulse Rate: 110 (07/27 1345) Intake/Output from previous day: 07/26 0701 - 07/27 0700 In: 727 [P.O.:240; NG/GT:237; IV Piggyback:250] Out: 3000  Intake/Output from this shift:    Labs:  Recent Labs  11/12/14 0633 11/12/14 0640 11/13/14 0736 11/14/14 0505  HGB 7.7*  --  8.1* 7.3*  CREATININE  --  4.55* 6.57*  --    Estimated Creatinine Clearance: 9.7 mL/min (by C-G formula based on Cr of 6.57). No results for input(s): VANCOTROUGH, VANCOPEAK, VANCORANDOM, GENTTROUGH, GENTPEAK, GENTRANDOM, TOBRATROUGH, TOBRAPEAK, TOBRARND, AMIKACINPEAK, AMIKACINTROU, AMIKACIN in the last 72 hours.   Microbiology: Recent Results (from the past 720 hour(s))  MRSA PCR Screening     Status: None   Collection Time: 11/09/14  9:40 PM  Result Value Ref Range Status   MRSA by PCR NEGATIVE NEGATIVE Final    Comment:        The GeneXpert MRSA Assay (FDA approved for NASAL specimens only), is one component of a comprehensive MRSA colonization surveillance program. It is not intended to diagnose MRSA infection nor to guide or monitor treatment for MRSA infections.     Anti-infectives    Start     Dose/Rate Route Frequency Ordered Stop   11/13/14 1200  vancomycin (VANCOCIN) IVPB 750 mg/150 ml premix     750 mg 150 mL/hr over 60 Minutes Intravenous Every T-Th-Sa (Hemodialysis) 11/12/14 1120     11/12/14 1130  vancomycin (VANCOCIN) 1,500 mg in sodium chloride 0.9 % 500 mL IVPB     1,500 mg 250 mL/hr over 120 Minutes Intravenous   Once 11/12/14 1117 11/12/14 1649   11/12/14 1030  piperacillin-tazobactam (ZOSYN) IVPB 3.375 g     3.375 g 12.5 mL/hr over 240 Minutes Intravenous Every 12 hours 11/12/14 1004     11/12/14 1015  vancomycin (VANCOCIN) IVPB 1000 mg/200 mL premix  Status:  Discontinued     1,000 mg 200 mL/hr over 60 Minutes Intravenous  Once 11/12/14 1004 11/12/14 1117      Assessment: 69 y/o F with ESRD on HD admitted for acute blood loss anemia ordered empiric abx for COPD exacerbation, r/o PNA  Goal of Therapy:  Vancomycin pre-HD level 15-25 mcg/ml  Plan:  Will continue vancomycin and Zosyn as ordered and check a pre-HD vanomcycin level 7/30.   Luisa Hart D 11/14/2014,1:49 PM

## 2014-11-14 NOTE — Progress Notes (Signed)
Patient ID: Ariel Smith, female   DOB: 03-02-1946, 69 y.o.   MRN: [id number]   [facility] PROGRESS NOTE  PCP: [name], MD  HPI/Subjective: Patient with cough. She states that this is happened ever since she started dialysis. Stool short of breath. Uncomfortable with the central line in her left neck. Just switched over to regular nasal cannula with pulse ox in the mid 80s.  Objective: Filed Vitals:   11/14/14 1245  BP: 98/87  Pulse: 107  Temp:   Resp: 21    Filed Weights   11/09/14 1129 11/13/14 0900 11/13/14 1234  Weight: 98.431 kg (217 lb) 108 kg (238 lb 1.6 oz) 105.7 kg (233 lb 0.4 oz)    ROS: Review of Systems  Constitutional: Negative for fever and chills.  Eyes: Negative for blurred vision.  Respiratory: Positive for cough and shortness of breath. Negative for sputum production.   Cardiovascular: Negative for chest pain.  Gastrointestinal: Negative for nausea, vomiting, abdominal pain, diarrhea and constipation.  Genitourinary: Negative for dysuria.  Musculoskeletal: Negative for joint pain.  Neurological: Negative for dizziness and headaches.   Exam: Physical Exam  Constitutional: She is oriented to person, place, and time.  HENT:  Nose: No mucosal edema.  Mouth/Throat: No oropharyngeal exudate or posterior oropharyngeal edema.  Eyes: Conjunctivae, EOM and lids are normal. Pupils are equal, round, and reactive to light.  Neck: No JVD present. Carotid bruit is not present. No edema present. No thyroid mass and no thyromegaly present.  Cardiovascular: Regular rhythm, S1 normal and S2 normal.  Exam reveals no gallop.   No murmur heard. Pulses:      Dorsalis pedis pulses are 2+ on the right side, and 2+ on the left side.  Respiratory: No respiratory distress. She has decreased breath sounds in the right lower field and the left lower field. She has no wheezes. She has rhonchi in the right lower field and the left lower field. She has no  rales.  GI: Soft. Bowel sounds are normal. There is no tenderness.  Musculoskeletal:       Right ankle: She exhibits swelling.       Left ankle: She exhibits swelling.  Lymphadenopathy:    She has no cervical adenopathy.  Neurological: She is alert and oriented to person, place, and time. No cranial nerve deficit.  Skin: Skin is warm. No rash noted. Nails show no clubbing.  Psychiatric: She has a normal mood and affect.    Data Reviewed: Basic Metabolic Panel:  Recent Labs Lab 11/09/14 1132 11/10/14 0538 11/12/14 0640 11/13/14 0736  NA 138 140 141 136  K 4.2 4.1 4.5 4.6  CL 98* 101 101 98*  CO2 GLUCOSE 142* 83 98 172*  BUN 59* 41* 27* 50*  CREATININE 5.49* 4.27* 4.55* 6.57*  CALCIUM 7.9* 7.6* 8.0* 8.0*  PHOS  --   --  4.0 4.9*   Liver Function Tests:  Recent Labs Lab 11/09/14 1132 11/12/14 0640 11/13/14 0736  AST 25  --   --   ALT 18  --   --   ALKPHOS 58  --   --   BILITOT 0.4  --   --   PROT 5.9*  --   --   ALBUMIN 3.2* 2.8* 2.9*   CBC:  Recent Labs Lab 11/09/14 1132  11/09/14 2108 11/10/14 0538 11/10/14 0941 11/10/14 1542 11/11/14 0641 11/12/14 0633 11/13/14 0736 11/14/14 0505  WBC 6.6  --   --  4.9  --   --  6.0  --   --   --   HGB 6.4*  < > 7.6* 7.1* 7.4* 6.8* 8.1* 7.7* 8.1* 7.3*  HCT 18.9*  < > 21.9* 21.1* 21.3* 20.1* 23.9*  --   --   --   MCV 103.7*  --   --  95.3  --   --  96.5  --   --   --   PLT 196  --   --  136*  --   --  132*  --   --   --   < > = values in this interval not displayed.     Studies: Dg Chest 1 View  11/14/2014   CLINICAL DATA:  Central line placement  EXAM: CHEST  1 VIEW  COMPARISON:  11/12/2014  FINDINGS: Left internal jugular central venous catheter place with its tip in the mid SVC and no left pneumothorax. Heart remains borderline enlarged. Bilateral airspace disease with a central and basilar distribution has worsened.  IMPRESSION: Left internal jugular central venous catheter place with its tip in the  mid SVC and no pneumothorax.  Worsening bilateral airspace disease.   Electronically Signed   By: Jolaine Click M.D.   On: 11/14/2014 11:18    Scheduled Meds: . budesonide (PULMICORT) nebulizer solution  0.25 mg Nebulization BID  . budesonide-formoterol  2 puff Inhalation BID  . calcitRIOL  0.25 mcg Oral Daily  . cinacalcet  60 mg Oral Q breakfast  . citalopram  20 mg Oral Daily  . donepezil  5 mg Oral QHS  . feeding supplement (NEPRO CARB STEADY)  237 mL Oral q morning - 10a  . ipratropium-albuterol  3 mL Nebulization Q6H  . methylPREDNISolone (SOLU-MEDROL) injection  40 mg Intravenous Daily  . multivitamin with minerals  1 tablet Oral Daily  . pantoprazole (PROTONIX) IV  40 mg Intravenous Q12H  . piperacillin-tazobactam (ZOSYN)  IV  3.375 g Intravenous Q12H  . [START ON 11/15/2014] pravastatin  20 mg Oral q1800  . sevelamer carbonate  2,400 mg Oral TID WC  . traZODone  25-50 mg Oral QHS  . vancomycin  750 mg Intravenous Q T,Th,Sa-HD    Assessment/Plan:  1. Acute on chronic respiratory failure with hypoxia. The patient was switched over to nasal cannula. Continue to monitor in the CCU stepdown overnight.  Solu-Medrol dose decreased to 40 mg daily. 2. Acute pneumonia bilaterally.  Continue vancomycin and Zosyn. 3. Fluid overload with IV fluids and blood-  dialysis to remove fluid 4. Acute blood loss anemia- patient's hemoglobin today is 7.3.  Likely will end up needing another unit of blood during the hospital course. Will monitor today. GI to decide when colonoscopy to be done. Hopefully respiratory status will continue to improve so this procedure can be done. 4.   Likely upper GI bleed- endoscopy only showed gastritis. Patient is on Protonix. Advance diet. 5.   Hypovolemic shock- blood pressure on the lower side. Continue to monitor. 6.   End-stage renal disease on hemodialysis- currently receiving hemodialysis. 7.   Left lower extremity DVT- s/p IVC filter.   Disposition Plan: To  be determined  Consultants:  Gastrointestinal  nephrology  Procedures:  Endoscopy  IVC filter placement  Time spent: 22 minutes, spoke with daughter on the phone.  Alford Highland  [facility]

## 2014-11-15 ENCOUNTER — Encounter: Payer: Self-pay | Admitting: *Deleted

## 2014-11-15 ENCOUNTER — Inpatient Hospital Stay (HOSPITAL_COMMUNITY)
Admit: 2014-11-15 | Discharge: 2014-11-15 | Disposition: A | Discharge status: Home / Self Care | Payer: Medicare Other | Attending: Nephrology | Admitting: Nephrology

## 2014-11-15 ENCOUNTER — Encounter: Payer: Self-pay | Admitting: Gastroenterology

## 2014-11-15 DIAGNOSIS — R0603 Acute respiratory distress: Secondary | ICD-10-CM | POA: Insufficient documentation

## 2014-11-15 DIAGNOSIS — I350 Nonrheumatic aortic (valve) stenosis: Secondary | ICD-10-CM

## 2014-11-15 LAB — HEMOGLOBIN: Hemoglobin: 7.6 g/dL — ABNORMAL LOW (ref 12.0–16.0)

## 2014-11-15 LAB — VANCOMYCIN, RANDOM: Vancomycin Rm: 22 ug/mL

## 2014-11-15 MED ORDER — ALPRAZOLAM 1 MG PO TABS
1.0000 mg | ORAL_TABLET | Freq: Three times a day (TID) | ORAL | Status: DC
  Administered 2014-11-15 – 2014-11-22 (×21): 1 mg via ORAL
  Filled 2014-11-15 (×21): qty 1

## 2014-11-15 MED ORDER — EPOETIN ALFA 10000 UNIT/ML IJ SOLN
10000.0000 [IU] | Freq: Once | INTRAMUSCULAR | Status: AC
  Administered 2014-11-15: 10000 [IU] via INTRAVENOUS
  Filled 2014-11-15 (×2): qty 1

## 2014-11-15 NOTE — Progress Notes (Signed)
ANTIBIOTIC CONSULT NOTE - FOLLOW UP  Pharmacy Consult for Vancomycin/Zosyn Indication: rule out pneumonia  Allergies  Allergen Reactions  . Other Other (See Comments)    Pt states that she is allergic to Midrin and it causes headaches.     Patient Measurements: Height: 5\' 4"  (162.6 cm) Weight: 225 lb 15.5 oz (102.5 kg) IBW/kg (Calculated) : 54.7   Vital Signs: Temp: 98.4 F (36.9 C) (07/28 1252) Temp Source: Oral (07/28 1252) BP: 115/55 mmHg (07/28 1300) Pulse Rate: 106 (07/28 1300) Intake/Output from previous day: 07/27 0701 - 07/28 0700 In: 1347 [P.O.:960; NG/GT:287; IV Piggyback:100] Out: 3000  Intake/Output from this shift: Total I/O In: 917 [P.O.:480; NG/GT:237; IV Piggyback:200] Out: 3000 [Other:3000]  Labs:  Recent Labs  11/13/14 0736 11/14/14 0505 11/15/14 0438  HGB 8.1* 7.3* 7.6*  CREATININE 6.57*  --   --    Estimated Creatinine Clearance: 9.5 mL/min (by C-G formula based on Cr of 6.57).  Recent Labs  11/15/14 1338  VANCORANDOM 22     Microbiology: Recent Results (from the past 720 hour(s))  MRSA PCR Screening     Status: None   Collection Time: 11/09/14  9:40 PM  Result Value Ref Range Status   MRSA by PCR NEGATIVE NEGATIVE Final    Comment:        The GeneXpert MRSA Assay (FDA approved for NASAL specimens only), is one component of a comprehensive MRSA colonization surveillance program. It is not intended to diagnose MRSA infection nor to guide or monitor treatment for MRSA infections.     Anti-infectives    Start     Dose/Rate Route Frequency Ordered Stop   11/13/14 1200  vancomycin (VANCOCIN) IVPB 750 mg/150 ml premix     750 mg 150 mL/hr over 60 Minutes Intravenous Every T-Th-Sa (Hemodialysis) 11/12/14 1120     11/12/14 1130  vancomycin (VANCOCIN) 1,500 mg in sodium chloride 0.9 % 500 mL IVPB     1,500 mg 250 mL/hr over 120 Minutes Intravenous  Once 11/12/14 1117 11/12/14 1649   11/12/14 1030  piperacillin-tazobactam (ZOSYN)  IVPB 3.375 g     3.375 g 12.5 mL/hr over 240 Minutes Intravenous Every 12 hours 11/12/14 1004     11/12/14 1015  vancomycin (VANCOCIN) IVPB 1000 mg/200 mL premix  Status:  Discontinued     1,000 mg 200 mL/hr over 60 Minutes Intravenous  Once 11/12/14 1004 11/12/14 1117      Assessment: 69 y/o F with ESRD on HD admitted for acute blood loss anemia ordered empiric abx for COPD exacerbation, r/o PNA. Patient received extra HD session yesterday.   Goal of Therapy:  Pre-HD vancomycin trough: 15-25  Plan:  Ordered random vancomycin level due to patient receiving extra HD session yesterday and no vancomycin dose. Vancomycin level is at goal. No need for further supplementation at this time. Will continue with steady-state vancomycin level as scheduled 7/30.   Ariel Smith D 11/15/2014,3:17 PM

## 2014-11-15 NOTE — Progress Notes (Signed)
Patient ID: Ariel Smith, female   DOB: 06-Jun-1945, 69 y.o.   MRN: [id number]   [facility] PROGRESS NOTE  PCP: [name], MD  HPI/Subjective: Patient's respiratory status worsened yesterday evening. Patient required to go back on the high flow nasal cannula. Currently she is on 70% high flow nasal cannula. She said when she is on the nasal cannula she is breathing comfortably. She still has tremendous amount of cough.   Objective: Filed Vitals:   11/15/14 0700  BP: 142/78  Pulse: 107  Temp:   Resp: 18    Filed Weights   11/13/14 1234 11/14/14 1535 11/15/14 0500  Weight: 105.7 kg (233 lb 0.4 oz) 103.1 kg (227 lb 4.7 oz) 101.6 kg (223 lb 15.8 oz)    ROS: Review of Systems  Constitutional: Negative for fever and chills.  Eyes: Negative for blurred vision.  Respiratory: Positive for cough and shortness of breath. Negative for sputum production.   Cardiovascular: Negative for chest pain.  Gastrointestinal: Negative for nausea, vomiting, abdominal pain, diarrhea and constipation.  Genitourinary: Negative for dysuria.  Musculoskeletal: Positive for neck pain. Negative for joint pain.  Neurological: Negative for dizziness and headaches.   Exam: Physical Exam  Constitutional: She is oriented to person, place, and time.  HENT:  Nose: No mucosal edema.  Mouth/Throat: No oropharyngeal exudate or posterior oropharyngeal edema.  Eyes: Conjunctivae, EOM and lids are normal. Pupils are equal, round, and reactive to light.  Neck: No JVD present. Carotid bruit is not present. No edema present. No thyroid mass and no thyromegaly present.  Cardiovascular: Regular rhythm, S1 normal and S2 normal.  Tachycardia present.  Exam reveals no gallop.   Murmur heard.  Systolic murmur is present with a grade of 3/6  Pulses:      Dorsalis pedis pulses are 2+ on the right side, and 2+ on the left side.  Respiratory: No respiratory distress. She has decreased breath sounds in  the right lower field and the left lower field. She has no wheezes. She has rhonchi in the right lower field and the left lower field. She has no rales.  GI: Soft. Bowel sounds are normal. There is no tenderness.  Musculoskeletal:       Right ankle: She exhibits swelling.       Left ankle: She exhibits swelling.  Lymphadenopathy:    She has no cervical adenopathy.  Neurological: She is alert and oriented to person, place, and time. No cranial nerve deficit.  Skin: Skin is warm. No rash noted. Nails show no clubbing.  Psychiatric: She has a normal mood and affect.    Data Reviewed: Basic Metabolic Panel:  Recent Labs Lab 11/09/14 1132 11/10/14 0538 11/12/14 0640 11/13/14 0736  NA 138 140 141 136  K 4.2 4.1 4.5 4.6  CL 98* 101 101 98*  CO2 27 30 30 26   GLUCOSE 142* 83 98 172*  BUN 59* 41* 27* 50*  CREATININE 5.49* 4.27* 4.55* 6.57*  CALCIUM 7.9* 7.6* 8.0* 8.0*  PHOS  --   --  4.0 4.9*   Liver Function Tests:  Recent Labs Lab 11/09/14 1132 11/12/14 0640 11/13/14 0736  AST 25  --   --   ALT 18  --   --   ALKPHOS 58  --   --   BILITOT 0.4  --   --   PROT 5.9*  --   --   ALBUMIN 3.2* 2.8* 2.9*   CBC:  Recent Labs Lab 11/09/14 1132  11/09/14 2108 11/10/14 0538 11/10/14 0941 11/10/14 1542 11/11/14 0641 11/12/14 0633 11/13/14 0736 11/14/14 0505 11/15/14 0438  WBC 6.6  --   --  4.9  --   --  6.0  --   --   --   --   HGB 6.4*  < > 7.6* 7.1* 7.4* 6.8* 8.1* 7.7* 8.1* 7.3* 7.6*  HCT 18.9*  < > 21.9* 21.1* 21.3* 20.1* 23.9*  --   --   --   --   MCV 103.7*  --   --  95.3  --   --  96.5  --   --   --   --   PLT 196  --   --  136*  --   --  132*  --   --   --   --   < > = values in this interval not displayed.     Studies: Dg Chest 1 View  11/14/2014   CLINICAL DATA:  Central line placement  EXAM: CHEST  1 VIEW  COMPARISON:  11/12/2014  FINDINGS: Left internal jugular central venous catheter place with its tip in the mid SVC and no left pneumothorax. Heart remains  borderline enlarged. Bilateral airspace disease with a central and basilar distribution has worsened.  IMPRESSION: Left internal jugular central venous catheter place with its tip in the mid SVC and no pneumothorax.  Worsening bilateral airspace disease.   Electronically Signed   By: Jolaine Click M.D.   On: 11/14/2014 11:18    Scheduled Meds: . budesonide (PULMICORT) nebulizer solution  0.25 mg Nebulization BID  . budesonide-formoterol  2 puff Inhalation BID  . calcitRIOL  0.25 mcg Oral Daily  . cinacalcet  60 mg Oral Q breakfast  . citalopram  20 mg Oral Daily  . donepezil  5 mg Oral QHS  . feeding supplement (NEPRO CARB STEADY)  237 mL Oral BID BM  . ipratropium-albuterol  3 mL Nebulization Q6H  . methylPREDNISolone (SOLU-MEDROL) injection  40 mg Intravenous Daily  . multivitamin with minerals  1 tablet Oral Daily  . pantoprazole (PROTONIX) IV  40 mg Intravenous Q12H  . piperacillin-tazobactam (ZOSYN)  IV  3.375 g Intravenous Q12H  . pravastatin  20 mg Oral q1800  . sevelamer carbonate  2,400 mg Oral TID WC  . traZODone  25-50 mg Oral QHS  . vancomycin  750 mg Intravenous Q T,Th,Sa-HD    Assessment/Plan:  1. Acute on chronic respiratory failure with hypoxia. The patient is back on high flow nasal cannula 70%. Solu-Medrol dose decreased to 40 mg daily.  Chest x-ray yesterday showing worsening airspace disease. 2. Acute pneumonia bilaterally.  Continue vancomycin and Zosyn. 3. Fluid overload with IV fluids and blood-  dialysis to remove fluid. 4.   Acute blood loss anemia- patient's hemoglobin today is 7.6.  Likely will end up needing another           unit of blood during the hospital course. Patient's respiratory status to tenuous for a colonoscopy       at this point. 5.   Likely upper GI bleed- endoscopy only showed gastritis. Patient is on Protonix. Advanced diet. 6.   Hypovolemic shock- blood pressure on the lower side. Continue to monitor. 7.   End-stage renal disease on  hemodialysis- hemodialysis as per nephrology. 7.   Left lower extremity DVT- s/p IVC filter.   Disposition Plan: To be determined  Consultants:  Gastrointestinal  Nephrology  Vascular surgery  Pulmonary  Procedures:  Endoscopy  IVC filter placement  Time spent: 25 minutes, spoke with daughter on the phone. DO NOT RESUSCITATE status.  Alford Highland  [facility]

## 2014-11-15 NOTE — Consult Note (Signed)
Palliative Medicine Inpatient Consult Follow Up Note   Name: Ariel Smith Date: 11/15/2014 MRN: [id number]  DOB: 12-14-1945  Referring Physician: Alford Highland, MD  Palliative Care consult requested for this 69 y.o. female for goals of medical therapy in patient with Bilateral Pneumonia, COPD, Mild to Moderate AS, Acute on Chronic Resp Failure, GI bleeding, anemia of chronic/ renal/ acute blood loss/ and B12 deficiency, ESRD on HD now having recently failed home PD, fluid overload, DVT with recent subtherapeutic INRS (Dr. Ronn Melena will start her on Eliquis when cleared by GI/ at discharge as coumadin was not being maintained at a therapeutic level). A daughter from [place] had caused patient to question her own decision for DNR status when the daughter came to visit 2 days ago and this prompted a Palliative Care consult since the status seemed to be up in the air for a time.   PLAN: I spoke with patient yesterday about her code status wishes and she confirmed she wanted to be DNR.  She told Dr. Belia Heman today (when asked about code status and vent wishes) that if she has a respiratory issue that would take place in a non-imminently dying setting, she might consider being on a ventilator (but she is only thinking about that now).  We would likely have time to ask her about her wishes in that setting, so she is still DNR.  If she directs otherwise, this can be changed.  She asked me to talk to her daughters and I will do so --particularly with the daughter from [place] who does not know as much about her mother's complex conditions as the daughter who is local and has been very involved all along.  The goals of care now are for aggressive care aimed at recovery from pneumonia.  An echo is going to be ordered by nephrology apparently. She looks better today, but her oxygen requirements and CXR are worse.  Will continue with supportive conversations, limiting how often we bring up code status--since she has a panic  attack almost every time this subject is brought up.  She will have a Xanax order per Dr. Belia Heman also.    REVIEW OF SYSTEMS:  Feels better on hi-flow oxygen  CODE STATUS: DNR currently. She has today told Dr. Belia Heman that In a non-dying situation, she might consider ventilation BUT she is only thinking about that now.  No decision has been made to change code status.  Discussed with Dr. Belia Heman (Pulmonologist) and Dr. Aram Candela (Nephrologist).     PAST MEDICAL HISTORY: Past Medical History  Diagnosis Date  . Osteoarthritis   . Depression   . Frequent headaches   . HTN (hypertension)   . HLD (hyperlipidemia)   . History of colon polyps 2013    colonoscopy ([name])  . 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 Systolic murmur     mild  . GERD (gastroesophageal reflux disease)   . COPD (chronic obstructive pulmonary disease)   . ESRD (end stage renal disease) 08/2011    TThSa, L forearm AV fistula, [name]  . Secondary hyperparathyroidism of renal origin   . Osteopenia 01/2013  . Bronchiectasis 08/2014     suggested by thoracic xray    PAST SURGICAL HISTORY:  Past Surgical History  Procedure Laterality Date  . Cholecystectomy  2012  . Bunionectomy Left 2003  . Replacement total knee Right 2006  .  Shoulder arthroscopy Right 2009  . Colonoscopy  08/2011    colon biopsies, [name]  . Spirometry  04/2011    WNL  . US echocardiography  06/2008    EF >55%  . Esophagogastroduodenoscopy  08/2011    gastric cardia polyp  . Tonsillectomy  1955  . Tubal ligation  1980  . Exteriorization of a continuous ambulatory peritoneal dialysis catheter  01/2013    removal 12/2103 - [name]  . Dexa  01/2013    osteopenia with -2.0 at hip and spine  . Hospitalization  12/2013    recurrent R pleural effusion due to peritoneal fluid translocation s/p rpt thoracentesis with 1.3 L fluid removed, ERSD started on HD  this hospitalization  . US echocardiography  12/2013    EF 55-60%, nl LV sys fxn, mild-mod MR, AS, increased LV posterior wall thickness, mild TR  . Esophagogastroduodenoscopy Left 11/11/2014    Procedure: ESOPHAGOGASTRODUODENOSCOPY (EGD);  Surgeon: [name], MD;  Location: [facility];  Service: Endoscopy;  Laterality: Left;  . Peripheral vascular catheterization N/A 11/12/2014    Procedure: IVC Filter Insertion;  Surgeon: [name], MD;  Location: [facility] INVASIVE CV LAB;  Service: Cardiovascular;  Laterality: N/A;    Vital Signs: BP 140/73 mmHg  Pulse 106  Temp(Src) 98.4 F (36.9 C) (Oral)  Resp 24  Ht 5\' 4"  (1.626 m)  Wt 105 kg (231 lb 7.7 oz)  BMI 39.71 kg/m2  SpO2 97% Filed Weights   11/14/14 1535 11/15/14 0500 11/15/14 0930  Weight: 103.1 kg (227 lb 4.7 oz) 101.6 kg (223 lb 15.8 oz) 105 kg (231 lb 7.7 oz)    Estimated body mass index is 39.71 kg/(m^2) as calculated from the following:   Height as of this encounter: 5\' 4"  (1.626 m).   Weight as of this encounter: 105 kg (231 lb 7.7 oz).  PHYSICAL EXAM: NAD  EOMI  On HI Flow o2 No JVD seen Heart rrr with 2-3/6 HSM  Lungs with some upper ronchi and decreased BS in both bases Abd soft and NT with nl bs Ext --moves all 4s Skin warm and dry   LABS: CBC:    Component Value Date/Time   WBC 6.0 11/11/2014 0641   WBC 8.0 01/11/2014 1559   HGB 7.6* 11/15/2014 0438   HGB 9.0* 01/11/2014 1559   HGB 9.0 06/02/2013   HCT 23.9* 11/11/2014 0641   HCT 27.0* 01/11/2014 1559   PLT 132* 11/11/2014 0641   PLT 149* 01/15/2014 0510   MCV 96.5 11/11/2014 0641   MCV 105* 01/11/2014 1559   MCV 105.9 06/02/2013   NEUTROABS 4.5 03/30/2014 1724   NEUTROABS 6.6* 11/21/2013 0027   LYMPHSABS 1.5 03/30/2014 1724   LYMPHSABS 1.1 11/21/2013 0027   MONOABS 0.8 03/30/2014 1724   MONOABS 0.7 11/21/2013 0027   EOSABS 0.1 03/30/2014 1724   EOSABS 0.2 11/21/2013 0027   BASOSABS 0.0 03/30/2014 1724   BASOSABS 0 01/08/2014 0912    BASOSABS 0.0 11/21/2013 0027   BASOSABS 2 09/04/2011 1607   Comprehensive Metabolic Panel:    Component Value Date/Time   NA 136 11/13/2014 0736   NA 142 01/11/2014 1559   NA 141 08/10/2011   K 4.6 11/13/2014 0736   K 3.7 01/11/2014 1559   K 4.3 08/10/2011   CL 98* 11/13/2014 0736   CL 109* 01/11/2014 1559   CO2 26 11/13/2014 0736   CO2 24 01/11/2014 1559   BUN 50* 11/13/2014 0736   BUN 42* 01/11/2014 1559  CREATININE 6.57* 11/13/2014 0736   CREATININE 6.17* 03/30/2014 1724   CREATININE 6.27* 01/11/2014 1559   GLUCOSE 172* 11/13/2014 0736   GLUCOSE 106* 01/11/2014 1559   CALCIUM 8.0* 11/13/2014 0736   CALCIUM 8.5 01/11/2014 1559   CALCIUM 8.4 06/02/2013   AST 25 11/09/2014 1132   AST 21 01/11/2014 1559   ALT 18 11/09/2014 1132   ALT 15 01/11/2014 1559   ALKPHOS 58 11/09/2014 1132   ALKPHOS 70 01/11/2014 1559   ALKPHOS 67 06/02/2013   BILITOT 0.4 11/09/2014 1132   BILITOT 0.2 01/11/2014 1559   PROT 5.9* 11/09/2014 1132   PROT 6.9 01/11/2014 1559   ALBUMIN 2.9* 11/13/2014 0736   ALBUMIN 3.3* 01/11/2014 1559    IMPRESSION:  IMPRESSION: 1) Acute on Chronic Respiratory Failure with Hypoxia 2) Bilateral Pneumonia 3) COPD --with acute exacerbation 4) Acute blood loss anemia and anemia of chronic disease --on EPO 5) Hypovolemic shock 6) ESRD on HD with secondary hypoparathyroidsim 7) Fluid overload -diuresed with HD 8) Left leg acute DVT s/p IVC filter and not anticoagulated due to GI bleeding --with subtherapeutic INRS as outpt on coumadin recently 9) Gastritis and GI bleeding (black tarry stools at home) 10) Dyslipidemia 11)Pernicious Anemia --she gets B12 shots at home 12) Vitamin D deficiency 13) Hyperglycemia w/o DM2 evident (HbA1C + 5.4) 14) Poor nutritional status despite obesity 15) Mild thrombocytopenia 16) Mild to Moderate Aortic Stenosis seen on echo Sept 2015.  (also with mod MR and mod TR with nl EF then).     SEE PLAN ABOVE   More than 50% of  the visit was spent in counseling/coordination of care: YES  Time Spent: 40 min

## 2014-11-15 NOTE — Progress Notes (Signed)
[facility]  ROUNDING NOTE   Subjective:   Continues on HFNC Seen and examined on hemodialysis. UF 3 liters  Objective:  Vital signs in last 24 hours:  Temp:  [98.3 F (36.8 C)-98.6 F (37 C)] 98.4 F (36.9 C) (07/28 0930) Pulse Rate:  [104-123] 106 (07/28 1015) Resp:  [12-38] 24 (07/28 1015) BP: (97-148)/(51-90) 140/73 mmHg (07/28 1015) SpO2:  [84 %-98 %] 97 % (07/28 1015) FiO2 (%):  [60 %-65 %] 65 % (07/28 0800) Weight:  [101.6 kg (223 lb 15.8 oz)-105 kg (231 lb 7.7 oz)] 105 kg (231 lb 7.7 oz) (07/28 0930)  Weight change: -4.9 kg (-10 lb 12.8 oz) Filed Weights   11/14/14 1535 11/15/14 0500 11/15/14 0930  Weight: 103.1 kg (227 lb 4.7 oz) 101.6 kg (223 lb 15.8 oz) 105 kg (231 lb 7.7 oz)    Intake/Output: I/O last 3 completed shifts: In: 1397 [P.O.:960; NG/GT:287; IV Piggyback:150] Out: 3000 [Other:3000]   Intake/Output this shift:  Total I/O In: 50 [IV Piggyback:50] Out: -   Physical Exam: General: Critically ill appearing  Head: Normocephalic, atraumatic. HFNC  Eyes: Anicteric  Neck: Supple, trachea midline  Lungs:  diminished  Heart: regular  Abdomen:  Soft, nontender, BS present  Extremities:  no peripheral edema.  Neurologic: Nonfocal, moving all four extremities  Skin: No lesions  Access: LUE AVF    Basic Metabolic Panel:  Recent Labs Lab 11/09/14 1132 11/10/14 0538 11/12/14 0640 11/13/14 0736  NA 138 140 141 136  K 4.2 4.1 4.5 4.6  CL 98* 101 101 98*  CO2 27 30 30 26   GLUCOSE 142* 83 98 172*  BUN 59* 41* 27* 50*  CREATININE 5.49* 4.27* 4.55* 6.57*  CALCIUM 7.9* 7.6* 8.0* 8.0*  PHOS  --   --  4.0 4.9*    Liver Function Tests:  Recent Labs Lab 11/09/14 1132 11/12/14 0640 11/13/14 0736  AST 25  --   --   ALT 18  --   --   ALKPHOS 58  --   --   BILITOT 0.4  --   --   PROT 5.9*  --   --   ALBUMIN 3.2* 2.8* 2.9*   No results for input(s): LIPASE, AMYLASE in the last 168 hours. No results for input(s): AMMONIA in the  last 168 hours.  CBC:  Recent Labs Lab 11/09/14 1132  11/09/14 2108 11/10/14 0538 11/10/14 0941 11/10/14 1542 11/11/14 0641 11/12/14 0633 11/13/14 0736 11/14/14 0505 11/15/14 0438  WBC 6.6  --   --  4.9  --   --  6.0  --   --   --   --   HGB 6.4*  < > 7.6* 7.1* 7.4* 6.8* 8.1* 7.7* 8.1* 7.3* 7.6*  HCT 18.9*  < > 21.9* 21.1* 21.3* 20.1* 23.9*  --   --   --   --   MCV 103.7*  --   --  95.3  --   --  96.5  --   --   --   --   PLT 196  --   --  136*  --   --  132*  --   --   --   --   < > = values in this interval not displayed.  Cardiac Enzymes: No results for input(s): CKTOTAL, CKMB, CKMBINDEX, TROPONINI in the last 168 hours.  BNP: Invalid input(s): POCBNP  CBG: No results for input(s): GLUCAP in the last 168 hours.  Microbiology: Results for orders placed  or performed during the hospital encounter of 11/09/14  MRSA PCR Screening     Status: None   Collection Time: 11/09/14  9:40 PM  Result Value Ref Range Status   MRSA by PCR NEGATIVE NEGATIVE Final    Comment:        The GeneXpert MRSA Assay (FDA approved for NASAL specimens only), is one component of a comprehensive MRSA colonization surveillance program. It is not intended to diagnose MRSA infection nor to guide or monitor treatment for MRSA infections.     Coagulation Studies: No results for input(s): LABPROT, INR in the last 72 hours.  Urinalysis: No results for input(s): COLORURINE, LABSPEC, PHURINE, GLUCOSEU, HGBUR, BILIRUBINUR, KETONESUR, PROTEINUR, UROBILINOGEN, NITRITE, LEUKOCYTESUR in the last 72 hours.  Invalid input(s): APPERANCEUR    Imaging: Dg Chest 1 View  11/14/2014   CLINICAL DATA:  Central line placement  EXAM: CHEST  1 VIEW  COMPARISON:  11/12/2014  FINDINGS: Left internal jugular central venous catheter place with its tip in the mid SVC and no left pneumothorax. Heart remains borderline enlarged. Bilateral airspace disease with a central and basilar distribution has worsened.   IMPRESSION: Left internal jugular central venous catheter place with its tip in the mid SVC and no pneumothorax.  Worsening bilateral airspace disease.   Electronically Signed   By: Jolaine Click M.D.   On: 11/14/2014 11:18     Medications:     . budesonide (PULMICORT) nebulizer solution  0.25 mg Nebulization BID  . budesonide-formoterol  2 puff Inhalation BID  . calcitRIOL  0.25 mcg Oral Daily  . cinacalcet  60 mg Oral Q breakfast  . citalopram  20 mg Oral Daily  . donepezil  5 mg Oral QHS  . feeding supplement (NEPRO CARB STEADY)  237 mL Oral BID BM  . ipratropium-albuterol  3 mL Nebulization Q6H  . methylPREDNISolone (SOLU-MEDROL) injection  40 mg Intravenous Daily  . multivitamin with minerals  1 tablet Oral Daily  . pantoprazole (PROTONIX) IV  40 mg Intravenous Q12H  . piperacillin-tazobactam (ZOSYN)  IV  3.375 g Intravenous Q12H  . pravastatin  20 mg Oral q1800  . sevelamer carbonate  2,400 mg Oral TID WC  . traZODone  25-50 mg Oral QHS  . vancomycin  750 mg Intravenous Q T,Th,Sa-HD   sodium chloride, sodium chloride, albuterol, benzonatate, chlorpheniramine-HYDROcodone, hydrOXYzine, lidocaine (PF), lidocaine-prilocaine, menthol-cetylpyridinium, pentafluoroprop-tetrafluoroeth, sevelamer carbonate, traMADol, traMADol, zolpidem  Assessment/ Plan:  68 y.o. female with past medical history of ESRD on HD TTHS, SHPTH, AOCD, HTN, hyperlipidemia, COPD, DVT with PE, tx with long term coumadin and IVC filter placement, L knee TKA, R shoulder surgery  [facility] [address]  1. End-stage renal disease on hemodialysis: seen and examined on hemodialysis . UF of goal 3 litre. Tolerating procedure well.  Monitor daily for dialysis need.   2. Anemia of chronic kidney disease/anemia of blood loss. Hemoglobin 7.6. The patient was having black tarry stools at home. Gastritis on EGD - status post transfusion PRBC on 7/22 - epo with HD.   3. Secondary hyperparathyroidism. Continue renvela  2400mg  po tid/wm. - PTH 713 outpatient, not well controlled.   - on calcitriol and cinacalcet.   4. Acute exacerbation of COPD with Respiratory Distress: improved clinically - solumedrol, empiric vanc and zosyn.  - nebs - Continue oxygen.  - echocardiogram  5. Actue DVT: with history of DVT. Status post IVC placed 7/25 [name].  - was on warfarin in the past.  - currently not on  anticoagulation due to active GI bleed. Will need something like Eliquis on discharge. Failed outpatient warfarin. Will wait for GI to clear for clearance for anticoagulation.    LOS: 6 Laymon Stockert 7/28/201610:24 AM

## 2014-11-15 NOTE — Progress Notes (Signed)
*  PRELIMINARY RESULTS* Echocardiogram 2D Echocardiogram has been performed.  Ariel Smith 11/15/2014, 1:59 PM

## 2014-11-15 NOTE — Progress Notes (Signed)
Hd tx completed. 

## 2014-11-15 NOTE — Consult Note (Signed)
GI Inpatient Follow-up Note  Patient Identification: Ariel Smith is a 69 y.o. female  Subjective: Breathing better on Bipap. Hgb fluctuates. No BM today. NO evidence of active GI bleeding.  Scheduled Inpatient Medications:  . ALPRAZolam  1 mg Oral TID  . budesonide (PULMICORT) nebulizer solution  0.25 mg Nebulization BID  . budesonide-formoterol  2 puff Inhalation BID  . calcitRIOL  0.25 mcg Oral Daily  . cinacalcet  60 mg Oral Q breakfast  . citalopram  20 mg Oral Daily  . donepezil  5 mg Oral QHS  . feeding supplement (NEPRO CARB STEADY)  237 mL Oral BID BM  . ipratropium-albuterol  3 mL Nebulization Q6H  . methylPREDNISolone (SOLU-MEDROL) injection  40 mg Intravenous Daily  . multivitamin with minerals  1 tablet Oral Daily  . pantoprazole (PROTONIX) IV  40 mg Intravenous Q12H  . piperacillin-tazobactam (ZOSYN)  IV  3.375 g Intravenous Q12H  . pravastatin  20 mg Oral q1800  . sevelamer carbonate  2,400 mg Oral TID WC  . traZODone  25-50 mg Oral QHS  . vancomycin  750 mg Intravenous Q T,Th,Sa-HD    Continuous Inpatient Infusions:     PRN Inpatient Medications:  sodium chloride, sodium chloride, albuterol, benzonatate, chlorpheniramine-HYDROcodone, hydrOXYzine, lidocaine (PF), lidocaine-prilocaine, menthol-cetylpyridinium, pentafluoroprop-tetrafluoroeth, sevelamer carbonate, traMADol, traMADol, zolpidem  Review of Systems: Constitutional: Weight is stable.  Eyes: No changes in vision. ENT: No oral lesions, sore throat.  GI: see HPI.  Heme/Lymph: No easy bruising.  CV: No chest pain.  GU: No hematuria.  Integumentary: No rashes.  Neuro: No headaches.  Psych: No depression/anxiety.  Endocrine: No heat/cold intolerance.  Allergic/Immunologic: No urticaria.  Resp: No cough, SOB.  Musculoskeletal: No joint swelling.    Physical Examination: BP 115/55 mmHg  Pulse 106  Temp(Src) 98.4 F (36.9 C) (Oral)  Resp 19  Ht 5\' 4"  (1.626 m)  Wt 102.5 kg (225 lb 15.5 oz)   BMI 38.77 kg/m2  SpO2 100% Gen: NAD, alert and oriented x 4 HEENT: PEERLA, EOMI, Neck: supple, no JVD or thyromegaly Chest:  Decreased breath bilaterally, no wheezes, crackles, or other adventitious sounds CV: RRR, no m/g/c/r Abd: soft, NT, ND, +BS in all four quadrants; no HSM, guarding, ridigity, or rebound tenderness Ext: no edema, well perfused with 2+ pulses, Skin: no rash or lesions noted Lymph: no LAD  Data: Lab Results  Component Value Date   WBC 6.0 11/11/2014   HGB 7.6* 11/15/2014   HCT 23.9* 11/11/2014   MCV 96.5 11/11/2014   PLT 132* 11/11/2014    Recent Labs Lab 11/13/14 0736 11/14/14 0505 11/15/14 0438  HGB 8.1* 7.3* 7.6*   Lab Results  Component Value Date   NA 136 11/13/2014   K 4.6 11/13/2014   CL 98* 11/13/2014   CO2 26 11/13/2014   BUN 50* 11/13/2014   CREATININE 6.57* 11/13/2014   Lab Results  Component Value Date   ALT 18 11/09/2014   AST 25 11/09/2014   ALKPHOS 58 11/09/2014   BILITOT 0.4 11/09/2014    Recent Labs Lab 11/10/14 0538  11/12/14 0633  APTT 32  --   --   INR 1.20  < > 1.10  < > = values in this interval not displayed. Assessment/Plan: Ms. Starek is a 69 y.o. female  With anemia.Minimal gastritis. On PPI BID. Pt had a completely NORMAL  colonoscopy in 2013.  Recommendations: At this point, NO need to repeat colonoscopy UNLESS pt clearly shows active bleeding with heme positive stool and  significant drop in hgb.  Will sign off. Thanks.  Please call with questions or concerns.  Brenya Taulbee, Ezzard Standing, MD

## 2014-11-15 NOTE — Consult Note (Signed)
Palliative Medicine Inpatient Consult Note   Name: Ariel Smith Date: 11/15/2014 MRN: [id number]  DOB: October 29, 1945  Referring Physician: Alford Highland, MD  Palliative Care consult requested for this 69 y.o. female for goals of medical therapy in patient with acute on chronic respiratory failure with hypoxia due to  COPD and Bilateral pneumonia and fluid overload.  She has had a GI bleed also --with endo showing gastritis and in no condition for a colonoscopy. The acute blood loss anemia required transfusions to be ordered.  She has been in hypovolemic shock and also has ESRD requiring HD as per nephrology.  She also has a left leg DVT s/p IVC filter --and not anticoagulated due to risk related to GI bleeding.   PLAN: I spoke with patient alone (without family in the room, but with nursing in and out of room and aware of the purpose of my talk with pt).  I told her that we were aware that she has always asked for DNR when she has come in before and that the decision is hers to make.  She was told that her current status as of this am is 'DNR' --it had not been changed yesterday despite the conflicting wishes of one daughter who wanted pt to be Full Code.  She acknowledged that she would want to keep it this way.  She does want me to talk with her daughters to explain this as she gets too nervous over the whole subject.   I talked with with nursing and will try to talk with family soon.    REVIEW OF SYSTEMS:  Patient is not able to provide ROS due to her anxiety and dyspnea   SPIRITUAL SUPPORT SYSTEM: Yes  She has adult children --one local I believe and one from [place] who is here (she is one who has not been in favor of the pt being DNR status and this became something of an issue yesterday resulting in a lot of anxiety on the patient's part). .  SOCIAL HISTORY:  reports that she quit smoking about 5 years ago. She started smoking about 36 years ago. She has never used smokeless tobacco. She  reports that she does not drink alcohol or use illicit drugs.  LEGAL DOCUMENTS:  Will review to see if any are in paper chart  CODE STATUS: DNR  PAST MEDICAL HISTORY: Past Medical History  Diagnosis Date  . Osteoarthritis   . Depression   . Frequent headaches   . HTN (hypertension)   . HLD (hyperlipidemia)   . History of colon polyps 2013    colonoscopy ([name])  . 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 Systolic murmur     mild  . GERD (gastroesophageal reflux disease)   . COPD (chronic obstructive pulmonary disease)   . ESRD (end stage renal disease) 08/2011    TThSa, L forearm AV fistula, [name]  . Secondary hyperparathyroidism of renal origin   . Osteopenia 01/2013  . Bronchiectasis 08/2014     suggested by thoracic xray    PAST SURGICAL HISTORY:  Past Surgical History  Procedure Laterality Date  . Cholecystectomy  2012  . Bunionectomy Left 2003  . Replacement total knee Right 2006  . Shoulder arthroscopy Right 2009  . Colonoscopy  08/2011    colon biopsies, [name]  . Spirometry  04/2011    WNL  . US  echocardiography  06/2008    EF >55%  . Esophagogastroduodenoscopy  08/2011    gastric cardia polyp  . Tonsillectomy  1955  . Tubal ligation  1980  . Exteriorization of a continuous ambulatory peritoneal dialysis catheter  01/2013    removal 12/2103 - [name]  . Dexa  01/2013    osteopenia with -2.0 at hip and spine  . Hospitalization  12/2013    recurrent R pleural effusion due to peritoneal fluid translocation s/p rpt thoracentesis with 1.3 L fluid removed, ERSD started on HD this hospitalization  . US echocardiography  12/2013    EF 55-60%, nl LV sys fxn, mild-mod MR, AS, increased LV posterior wall thickness, mild TR  . Esophagogastroduodenoscopy Left 11/11/2014    Procedure: ESOPHAGOGASTRODUODENOSCOPY (EGD);  Surgeon: [name], MD;  Location: [facility] ENDOSCOPY;   Service: Endoscopy;  Laterality: Left;  . Peripheral vascular catheterization N/A 11/12/2014    Procedure: IVC Filter Insertion;  Surgeon: [name], MD;  Location: [facility] INVASIVE CV LAB;  Service: Cardiovascular;  Laterality: N/A;    ALLERGIES:  is allergic to other.  MEDICATIONS:  Current Facility-Administered Medications  Medication Dose Route Frequency Provider Last Rate Last Dose  . 0.9 %  sodium chloride infusion  100 mL Intravenous PRN Munsoor Lateef, MD      . 0.9 %  sodium chloride infusion  100 mL Intravenous PRN Munsoor Lateef, MD      . albuterol (PROVENTIL) (2.5 MG/3ML) 0.083% nebulizer solution 2.5 mg  2.5 mg Nebulization Q6H PRN Ramonita Lab, MD   2.5 mg at 11/12/14 1010  . benzonatate (TESSALON) capsule 100 mg  100 mg Oral Q6H PRN Shaune Pollack, MD   100 mg at 11/11/14 1610  . budesonide (PULMICORT) nebulizer solution 0.25 mg  0.25 mg Nebulization BID Erin Fulling, MD   0.25 mg at 11/15/14 0743  . budesonide-formoterol (SYMBICORT) 160-4.5 MCG/ACT inhaler 2 puff  2 puff Inhalation BID Ramonita Lab, MD   2 puff at 11/15/14 0854  . calcitRIOL (ROCALTROL) capsule 0.25 mcg  0.25 mcg Oral Daily Erin Fulling, MD   0.25 mcg at 11/15/14 0854  . chlorpheniramine-HYDROcodone (TUSSIONEX) 10-8 MG/5ML suspension 5 mL  5 mL Oral Q12H PRN Alford Highland, MD   5 mL at 11/12/14 2016  . cinacalcet (SENSIPAR) tablet 60 mg  60 mg Oral Q breakfast Erin Fulling, MD   60 mg at 11/15/14 0854  . citalopram (CELEXA) tablet 20 mg  20 mg Oral Daily Erin Fulling, MD   20 mg at 11/15/14 0854  . donepezil (ARICEPT) tablet 5 mg  5 mg Oral QHS Ramonita Lab, MD   5 mg at 11/14/14 2208  . epoetin alfa (EPOGEN,PROCRIT) injection 10,000 Units  10,000 Units Intravenous Once Lamont Dowdy, MD      . feeding supplement (NEPRO CARB STEADY) liquid 237 mL  237 mL Oral BID BM Alford Highland, MD   237 mL at 11/15/14 0902  . hydrOXYzine (ATARAX/VISTARIL) tablet 25 mg  25 mg Oral TID PRN Ramonita Lab, MD   25 mg at 11/14/14 0829  .  ipratropium-albuterol (DUONEB) 0.5-2.5 (3) MG/3ML nebulizer solution 3 mL  3 mL Nebulization Q6H Erin Fulling, MD   3 mL at 11/15/14 0743  . lidocaine (PF) (XYLOCAINE) 1 % injection 5 mL  5 mL Intradermal PRN Munsoor Lateef, MD      . lidocaine-prilocaine (EMLA) cream 1 application  1 application Topical PRN Munsoor Lateef, MD   1 application at 11/15/14 0915  . menthol-cetylpyridinium (  CEPACOL) lozenge 3 mg  1 lozenge Oral PRN Shaune Pollack, MD   3 mg at 11/10/14 2305  . methylPREDNISolone sodium succinate (SOLU-MEDROL) 40 mg/mL injection 40 mg  40 mg Intravenous Daily Erin Fulling, MD   40 mg at 11/15/14 0853  . multivitamin with minerals tablet 1 tablet  1 tablet Oral Daily Ramonita Lab, MD   1 tablet at 11/15/14 0853  . pantoprazole (PROTONIX) injection 40 mg  40 mg Intravenous Q12H Shane Crutch, MD   40 mg at 11/15/14 0853  . pentafluoroprop-tetrafluoroeth (GEBAUERS) aerosol 1 application  1 application Topical PRN Munsoor Lateef, MD      . piperacillin-tazobactam (ZOSYN) IVPB 3.375 g  3.375 g Intravenous Q12H Lamont Dowdy, MD   3.375 g at 11/15/14 0854  . pravastatin (PRAVACHOL) tablet 20 mg  20 mg Oral q1800 Erin Fulling, MD      . sevelamer carbonate (RENVELA) tablet 1,600 mg  1,600 mg Oral TID BM PRN Ramonita Lab, MD   1,600 mg at 11/10/14 0806  . sevelamer carbonate (RENVELA) tablet 2,400 mg  2,400 mg Oral TID WC Ramonita Lab, MD   2,400 mg at 11/15/14 0853  . traMADol (ULTRAM) tablet 25 mg  25 mg Oral Q12H PRN [name], MD   25 mg at 11/14/14 1429  . traMADol (ULTRAM) tablet 50 mg  50 mg Oral Q12H PRN Ramonita Lab, MD   50 mg at 11/15/14 0041  . traZODone (DESYREL) tablet 25-50 mg  25-50 mg Oral QHS Ramonita Lab, MD   50 mg at 11/14/14 2208  . vancomycin (VANCOCIN) IVPB 750 mg/150 ml premix  750 mg Intravenous Q T,Th,Sa-HD Alford Highland, MD   750 mg at 11/13/14 1224  . zolpidem (AMBIEN) tablet 5 mg  5 mg Oral QHS PRN Gracelyn Nurse, MD   5 mg at 11/15/14 0041    Vital Signs: BP  135/72 mmHg  Pulse 110  Temp(Src) 98.3 F (36.8 C) (Oral)  Resp 27  Ht 5\' 4"  (1.626 m)  Wt 101.6 kg (223 lb 15.8 oz)  BMI 38.43 kg/m2  SpO2 95% Filed Weights   11/13/14 1234 11/14/14 1535 11/15/14 0500  Weight: 105.7 kg (233 lb 0.4 oz) 103.1 kg (227 lb 4.7 oz) 101.6 kg (223 lb 15.8 oz)    Estimated body mass index is 38.43 kg/(m^2) as calculated from the following:   Height as of this encounter: 5\' 4"  (1.626 m).   Weight as of this encounter: 101.6 kg (223 lb 15.8 oz).  PERFORMANCE STATUS (ECOG) : 3 - Symptomatic, >50% confined to bed currently  PHYSICAL EXAM: NAD but anxious --not as anxious as yesterday EOMI  On HI Flow o2 No JVD seen Heart rrr with 2-3/6 HSM  Lungs with some upper ronchi and decreased BS in both bases Abd soft and NT with nl bs Ext --moves all 4s Skin warm and dry   LABS: CBC:    Component Value Date/Time   WBC 6.0 11/11/2014 0641   WBC 8.0 01/11/2014 1559   HGB 7.6* 11/15/2014 0438   HGB 9.0* 01/11/2014 1559   HGB 9.0 06/02/2013   HCT 23.9* 11/11/2014 0641   HCT 27.0* 01/11/2014 1559   PLT 132* 11/11/2014 0641   PLT 149* 01/15/2014 0510   MCV 96.5 11/11/2014 0641   MCV 105* 01/11/2014 1559   MCV 105.9 06/02/2013   NEUTROABS 4.5 03/30/2014 1724   NEUTROABS 6.6* 11/21/2013 0027   LYMPHSABS 1.5 03/30/2014 1724   LYMPHSABS 1.1 11/21/2013 0027  MONOABS 0.8 03/30/2014 1724   MONOABS 0.7 11/21/2013 0027   EOSABS 0.1 03/30/2014 1724   EOSABS 0.2 11/21/2013 0027   BASOSABS 0.0 03/30/2014 1724   BASOSABS 0 01/08/2014 0912   BASOSABS 0.0 11/21/2013 0027   BASOSABS 2 09/04/2011 1607   Comprehensive Metabolic Panel:    Component Value Date/Time   NA 136 11/13/2014 0736   NA 142 01/11/2014 1559   NA 141 08/10/2011   K 4.6 11/13/2014 0736   K 3.7 01/11/2014 1559   K 4.3 08/10/2011   CL 98* 11/13/2014 0736   CL 109* 01/11/2014 1559   CO2 26 11/13/2014 0736   CO2 24 01/11/2014 1559   BUN 50* 11/13/2014 0736   BUN 42* 01/11/2014 1559    CREATININE 6.57* 11/13/2014 0736   CREATININE 6.17* 03/30/2014 1724   CREATININE 6.27* 01/11/2014 1559   GLUCOSE 172* 11/13/2014 0736   GLUCOSE 106* 01/11/2014 1559   CALCIUM 8.0* 11/13/2014 0736   CALCIUM 8.5 01/11/2014 1559   CALCIUM 8.4 06/02/2013   AST 25 11/09/2014 1132   AST 21 01/11/2014 1559   ALT 18 11/09/2014 1132   ALT 15 01/11/2014 1559   ALKPHOS 58 11/09/2014 1132   ALKPHOS 70 01/11/2014 1559   ALKPHOS 67 06/02/2013   BILITOT 0.4 11/09/2014 1132   BILITOT 0.2 01/11/2014 1559   PROT 5.9* 11/09/2014 1132   PROT 6.9 01/11/2014 1559   ALBUMIN 2.9* 11/13/2014 0736   ALBUMIN 3.3* 01/11/2014 1559    IMPRESSION: 1) Acute on Chronic Respiratory Failure with Hypoxia 2) Bilateral Pneumonia 3) COPD --with acute exacerbation 4) Acute blood loss anemia and anemia of chronic disease --on EPO 5) Hypovolemic shock 6) ESRD on HD with secondary hypoparathyroidsim 7) Fluid overload -diuresed with HD 8) Left leg acute DVT s/p IVC filter and not anticoagulated due to GI bleeding 9) Gastritis and GI bleeding (black tarry stools at home) 10) Dyslipidemia 11)Pernicious Anemia --she gets B12 shots at home 12) Vitamin D deficiency 13) Hyperglycemia w/o DM2 evident (HbA1C + 5.4) 14) Poor nutritional status despite obesity 15) Mild thrombocytopenia  See PLAN above.   More than 50% of the visit was spent in counseling/coordination of care: Yes  Time Spent:  50 minutes

## 2014-11-15 NOTE — Consult Note (Signed)
PULMONARY / CRITICAL CARE MEDICINE   Name: Ariel Smith MRN: [id number] DOB: [birth year]    ADMISSION DATE:  11/09/2014    CHIEF COMPLAINT:   Follow up SOB and resp distress   HISTORY OF PRESENT ILLNESS   Patient remains SOB, fio2 increased to 60% last night, tolerating BD therapy On steroids and ABX  I had Discussion of code status with patient and she would like to think about it some more      PAST MEDICAL HISTORY    :  Past Medical History  Diagnosis Date  . Osteoarthritis   . Depression   . Frequent headaches   . HTN (hypertension)   . HLD (hyperlipidemia)   . History of colon polyps 2013    colonoscopy ([name])  . 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 Systolic murmur     mild  . GERD (gastroesophageal reflux disease)   . COPD (chronic obstructive pulmonary disease)   . ESRD (end stage renal disease) 08/2011    TThSa, L forearm AV fistula, [name]  . Secondary hyperparathyroidism of renal origin   . Osteopenia 01/2013  . Bronchiectasis 08/2014     suggested by thoracic xray   Past Surgical History  Procedure Laterality Date  . Cholecystectomy  2012  . Bunionectomy Left 2003  . Replacement total knee Right 2006  . Shoulder arthroscopy Right 2009  . Colonoscopy  08/2011    colon biopsies, [name]  . Spirometry  04/2011    WNL  . US echocardiography  06/2008    EF >55%  . Esophagogastroduodenoscopy  08/2011    gastric cardia polyp  . Tonsillectomy  1955  . Tubal ligation  1980  . Exteriorization of a continuous ambulatory peritoneal dialysis catheter  01/2013    removal 12/2103 - [name]  . Dexa  01/2013    osteopenia with -2.0 at hip and spine  . Hospitalization  12/2013    recurrent R pleural effusion due to peritoneal fluid translocation s/p rpt thoracentesis with 1.3 L fluid removed, ERSD started on HD this hospitalization  . US echocardiography   12/2013    EF 55-60%, nl LV sys fxn, mild-mod MR, AS, increased LV posterior wall thickness, mild TR  . Esophagogastroduodenoscopy Left 11/11/2014    Procedure: ESOPHAGOGASTRODUODENOSCOPY (EGD);  Surgeon: [name], MD;  Location: [facility] ENDOSCOPY;  Service: Endoscopy;  Laterality: Left;  . Peripheral vascular catheterization N/A 11/12/2014    Procedure: IVC Filter Insertion;  Surgeon: [name], MD;  Location: [facility] INVASIVE CV LAB;  Service: Cardiovascular;  Laterality: N/A;   Prior to Admission medications   Medication Sig Start Date End Date Taking? Authorizing Provider  acetaminophen (TYLENOL) 500 MG tablet Take 1,000 mg by mouth every 6 (six) hours as needed for mild pain or headache.    Yes Historical Provider, MD  albuterol (VENTOLIN HFA) 108 (90 BASE) MCG/ACT inhaler Inhale 2 puffs into the lungs every 6 (six) hours as needed for wheezing or shortness of breath.    Yes Historical Provider, MD  baclofen (LIORESAL) 10 MG tablet Take 5-10 mg by mouth 2 (two) times daily as needed for muscle spasms.   Yes Historical Provider, MD  budesonide-formoterol (SYMBICORT) 160-4.5 MCG/ACT inhaler Inhale 2 puffs into the lungs 2 (two) times daily.   Yes Historical Provider, MD  calcitRIOL (ROCALTROL) 0.25 MCG capsule Take 0.25 mcg by  mouth daily.   Yes Historical Provider, MD  calcium acetate (PHOSLO) 667 MG capsule Take 2,001 mg by mouth 3 (three) times daily with meals.    Yes Historical Provider, MD  cinacalcet (SENSIPAR) 60 MG tablet Take 60 mg by mouth daily.   Yes Historical Provider, MD  citalopram (CELEXA) 20 MG tablet Take 1 tablet (20 mg total) by mouth daily. 03/30/14  Yes [name], MD  donepezil (ARICEPT) 5 MG tablet Take 1 tablet (5 mg total) by mouth at bedtime. Patient taking differently: Take 2.5 mg by mouth at bedtime.  11/01/14  Yes [name], MD  hydrOXYzine (ATARAX/VISTARIL) 25 MG tablet Take 12.5-25 mg by mouth 2 (two) times daily as needed for anxiety.   Yes Historical  Provider, MD  lovastatin (MEVACOR) 20 MG tablet Take 20 mg by mouth at bedtime.   Yes Historical Provider, MD  pantoprazole (PROTONIX) 40 MG tablet Take 40 mg by mouth daily as needed (for heartburn/indigestion).   Yes Historical Provider, MD  promethazine (PHENERGAN) 12.5 MG tablet Take 12.5 mg by mouth every 6 (six) hours as needed for nausea or vomiting.   Yes Historical Provider, MD  traMADol (ULTRAM) 50 MG tablet Take 25 mg by mouth every 12 (twelve) hours as needed for moderate pain.   Yes Historical Provider, MD  traZODone (DESYREL) 50 MG tablet Take 0.5-1 tablets (25-50 mg total) by mouth at bedtime. Patient taking differently: Take 25 mg by mouth at bedtime.  11/01/14  Yes [name], MD  warfarin (COUMADIN) 2.5 MG tablet Take 2.5 mg by mouth at bedtime.    Yes Historical Provider, MD  cyanocobalamin (,VITAMIN B-12,) 1000 MCG/ML injection Inject 1,000 mcg into the muscle every 30 (thirty) days. Start 10/2014    [name], MD   Allergies  Allergen Reactions  . Other Other (See Comments)    Pt states that she is allergic to Midrin and it causes headaches.      FAMILY HISTORY   Family History  Problem Relation Age of Onset  . Deep vein thrombosis Brother   . Cancer Mother 9    colon  . Cancer Father     prostate  . Hypertension Father   . Hypertension Brother   . Stroke Mother   . Diabetes Neg Hx   . CAD Neg Hx   . Cancer Daughter 90    breast  . Dementia Father 89      SOCIAL HISTORY    reports that she quit smoking about 5 years ago. She started smoking about 36 years ago. She has never used smokeless tobacco. She reports that she does not drink alcohol or use illicit drugs.  Review of Systems  Constitutional: Positive for malaise/fatigue. Negative for fever, chills, weight loss and diaphoresis.  HENT: Negative for congestion and hearing loss.   Eyes: Negative for blurred vision and double vision.  Respiratory: Positive for cough, shortness of breath  and wheezing.   Cardiovascular: Negative for chest pain, palpitations and orthopnea.  Gastrointestinal: Negative for heartburn, nausea, vomiting, abdominal pain, diarrhea, constipation and blood in stool.  Genitourinary: Negative for dysuria and urgency.  Musculoskeletal: Negative for myalgias, back pain and neck pain.  Skin: Negative for rash.  Neurological: Positive for weakness. Negative for dizziness, tingling, tremors and headaches.  Endo/Heme/Allergies: Does not bruise/bleed easily.  Psychiatric/Behavioral: Negative for depression, suicidal ideas and substance abuse.  All other systems reviewed and are negative.     VITAL SIGNS    Temp:  [98.3 F (36.8  C)-98.6 F (37 C)] 98.3 F (36.8 C) (07/28 0800) Pulse Rate:  [106-123] 107 (07/28 0800) Resp:  [12-38] 23 (07/28 0800) BP: (97-148)/(51-90) 135/72 mmHg (07/28 0800) SpO2:  [84 %-95 %] 91 % (07/28 0800) FiO2 (%):  [40 %-65 %] 65 % (07/28 0800) Weight:  [223 lb 15.8 oz (101.6 kg)-227 lb 4.7 oz (103.1 kg)] 223 lb 15.8 oz (101.6 kg) (07/28 0500) HEMODYNAMICS:   VENTILATOR SETTINGS: Vent Mode:  [-]  FiO2 (%):  [40 %-65 %] 65 % INTAKE / OUTPUT:  Intake/Output Summary (Last 24 hours) at 11/15/14 0488 Last data filed at 11/15/14 0854  Gross per 24 hour  Intake    920 ml  Output   3000 ml  Net  -2080 ml       PHYSICAL EXAM   Physical Exam  Constitutional: She is oriented to person, place, and time. She appears well-developed. No distress.  HENT:  Head: Normocephalic and atraumatic.  Eyes: Conjunctivae are normal. Pupils are equal, round, and reactive to light. No scleral icterus.  Neck: Normal range of motion. Neck supple.  Cardiovascular: Normal rate and regular rhythm.   Murmur heard. Pulmonary/Chest: She is in respiratory distress. She has wheezes. She has rales.  Abdominal: Soft. Bowel sounds are normal. She exhibits no distension. There is no tenderness. There is no rebound.  Musculoskeletal: Normal range of  motion. She exhibits no edema or tenderness.  Neurological: She is alert and oriented to person, place, and time. She displays normal reflexes. No cranial nerve deficit. Coordination normal.  Skin: Skin is warm. No rash noted. She is not diaphoretic. No erythema.  Psychiatric: She has a normal mood and affect. Her behavior is normal.       LABS   LABS:  CBC  Recent Labs Lab 11/09/14 1132  11/10/14 0538 11/10/14 0941 11/10/14 1542 11/11/14 0641  11/13/14 0736 11/14/14 0505 11/15/14 0438  WBC 6.6  --  4.9  --   --  6.0  --   --   --   --   HGB 6.4*  < > 7.1* 7.4* 6.8* 8.1*  < > 8.1* 7.3* 7.6*  HCT 18.9*  < > 21.1* 21.3* 20.1* 23.9*  --   --   --   --   PLT 196  --  136*  --   --  132*  --   --   --   --   < > = values in this interval not displayed. Coag's  Recent Labs Lab 11/10/14 0538  11/10/14 2242 11/11/14 0641 11/12/14 0633  APTT 32  --   --   --   --   INR 1.20  < > 1.09 1.07 1.10  < > = values in this interval not displayed. BMET  Recent Labs Lab 11/10/14 0538 11/12/14 0640 11/13/14 0736  NA 140 141 136  K 4.1 4.5 4.6  CL 101 101 98*  CO2 30 30 26   BUN 41* 27* 50*  CREATININE 4.27* 4.55* 6.57*  GLUCOSE 83 98 172*   Electrolytes  Recent Labs Lab 11/10/14 0538 11/12/14 0640 11/13/14 0736  CALCIUM 7.6* 8.0* 8.0*  PHOS  --  4.0 4.9*   Sepsis Markers No results for input(s): LATICACIDVEN, PROCALCITON, O2SATVEN in the last 168 hours. ABG No results for input(s): PHART, PCO2ART, PO2ART in the last 168 hours. Liver Enzymes  Recent Labs Lab 11/09/14 1132 11/12/14 0640 11/13/14 0736  AST 25  --   --   ALT 18  --   --  ALKPHOS 58  --   --   BILITOT 0.4  --   --   ALBUMIN 3.2* 2.8* 2.9*   Cardiac Enzymes No results for input(s): TROPONINI, PROBNP in the last 168 hours. Glucose No results for input(s): GLUCAP in the last 168 hours.   Recent Results (from the past 240 hour(s))  MRSA PCR Screening     Status: None   Collection Time:  11/09/14  9:40 PM  Result Value Ref Range Status   MRSA by PCR NEGATIVE NEGATIVE Final    Comment:        The GeneXpert MRSA Assay (FDA approved for NASAL specimens only), is one component of a comprehensive MRSA colonization surveillance program. It is not intended to diagnose MRSA infection nor to guide or monitor treatment for MRSA infections.      Current facility-administered medications:  .  0.9 %  sodium chloride infusion, 100 mL, Intravenous, PRN, Munsoor Lateef, MD .  0.9 %  sodium chloride infusion, 100 mL, Intravenous, PRN, Munsoor Lateef, MD .  albuterol (PROVENTIL) (2.5 MG/3ML) 0.083% nebulizer solution 2.5 mg, 2.5 mg, Nebulization, Q6H PRN, Ramonita Lab, MD, 2.5 mg at 11/12/14 1010 .  benzonatate (TESSALON) capsule 100 mg, 100 mg, Oral, Q6H PRN, Shaune Pollack, MD, 100 mg at 11/11/14 1610 .  budesonide (PULMICORT) nebulizer solution 0.25 mg, 0.25 mg, Nebulization, BID, Erin Fulling, MD, 0.25 mg at 11/15/14 0743 .  budesonide-formoterol (SYMBICORT) 160-4.5 MCG/ACT inhaler 2 puff, 2 puff, Inhalation, BID, Ramonita Lab, MD, 2 puff at 11/15/14 0854 .  calcitRIOL (ROCALTROL) capsule 0.25 mcg, 0.25 mcg, Oral, Daily, Erin Fulling, MD, 0.25 mcg at 11/15/14 0854 .  chlorpheniramine-HYDROcodone (TUSSIONEX) 10-8 MG/5ML suspension 5 mL, 5 mL, Oral, Q12H PRN, Alford Highland, MD, 5 mL at 11/12/14 2016 .  cinacalcet (SENSIPAR) tablet 60 mg, 60 mg, Oral, Q breakfast, Erin Fulling, MD, 60 mg at 11/15/14 0854 .  citalopram (CELEXA) tablet 20 mg, 20 mg, Oral, Daily, Erin Fulling, MD, 20 mg at 11/15/14 0854 .  donepezil (ARICEPT) tablet 5 mg, 5 mg, Oral, QHS, Ramonita Lab, MD, 5 mg at 11/14/14 2208 .  epoetin alfa (EPOGEN,PROCRIT) injection 10,000 Units, 10,000 Units, Intravenous, Once, Lamont Dowdy, MD .  feeding supplement (NEPRO CARB STEADY) liquid 237 mL, 237 mL, Oral, BID BM, Alford Highland, MD, 237 mL at 11/15/14 0902 .  hydrOXYzine (ATARAX/VISTARIL) tablet 25 mg, 25 mg, Oral, TID PRN, Ramonita Lab, MD, 25 mg at 11/14/14 0829 .  ipratropium-albuterol (DUONEB) 0.5-2.5 (3) MG/3ML nebulizer solution 3 mL, 3 mL, Nebulization, Q6H, Erin Fulling, MD, 3 mL at 11/15/14 0743 .  lidocaine (PF) (XYLOCAINE) 1 % injection 5 mL, 5 mL, Intradermal, PRN, Munsoor Lateef, MD .  lidocaine-prilocaine (EMLA) cream 1 application, 1 application, Topical, PRN, Munsoor Lateef, MD, 1 application at 11/15/14 0915 .  menthol-cetylpyridinium (CEPACOL) lozenge 3 mg, 1 lozenge, Oral, PRN, Shaune Pollack, MD, 3 mg at 11/10/14 2305 .  methylPREDNISolone sodium succinate (SOLU-MEDROL) 40 mg/mL injection 40 mg, 40 mg, Intravenous, Daily, Erin Fulling, MD, 40 mg at 11/15/14 0853 .  multivitamin with minerals tablet 1 tablet, 1 tablet, Oral, Daily, Ramonita Lab, MD, 1 tablet at 11/15/14 0853 .  pantoprazole (PROTONIX) injection 40 mg, 40 mg, Intravenous, Q12H, Shane Crutch, MD, 40 mg at 11/15/14 0853 .  pentafluoroprop-tetrafluoroeth (GEBAUERS) aerosol 1 application, 1 application, Topical, PRN, Munsoor Lateef, MD .  piperacillin-tazobactam (ZOSYN) IVPB 3.375 g, 3.375 g, Intravenous, Q12H, Lamont Dowdy, MD, 3.375 g at 11/15/14 0854 .  pravastatin (PRAVACHOL) tablet 20 mg,  20 mg, Oral, q1800, Erin Fulling, MD .  sevelamer carbonate (RENVELA) tablet 1,600 mg, 1,600 mg, Oral, TID BM PRN, Ramonita Lab, MD, 1,600 mg at 11/10/14 0806 .  sevelamer carbonate (RENVELA) tablet 2,400 mg, 2,400 mg, Oral, TID WC, Ramonita Lab, MD, 2,400 mg at 11/15/14 0853 .  traMADol (ULTRAM) tablet 25 mg, 25 mg, Oral, Q12H PRN, [name], MD, 25 mg at 11/14/14 1429 .  traMADol (ULTRAM) tablet 50 mg, 50 mg, Oral, Q12H PRN, Ramonita Lab, MD, 50 mg at 11/15/14 0041 .  traZODone (DESYREL) tablet 25-50 mg, 25-50 mg, Oral, QHS, Ramonita Lab, MD, 50 mg at 11/14/14 2208 .  vancomycin (VANCOCIN) IVPB 750 mg/150 ml premix, 750 mg, Intravenous, Q T,Th,Sa-HD, Alford Highland, MD, 750 mg at 11/13/14 1224 .  zolpidem (AMBIEN) tablet 5 mg, 5 mg, Oral, QHS PRN, Gracelyn Nurse, MD, 5 mg at 11/15/14 0041  IMAGING    Dg Chest 1 View  11/14/2014   CLINICAL DATA:  Central line placement  EXAM: CHEST  1 VIEW  COMPARISON:  11/12/2014  FINDINGS: Left internal jugular central venous catheter place with its tip in the mid SVC and no left pneumothorax. Heart remains borderline enlarged. Bilateral airspace disease with a central and basilar distribution has worsened.  IMPRESSION: Left internal jugular central venous catheter place with its tip in the mid SVC and no pneumothorax.  Worsening bilateral airspace disease.   Electronically Signed   By: Jolaine Click M.D.   On: 11/14/2014 11:18        ASSESSMENT/PLAN   69 yo obese white female admitted to ICU for acute and severe resp failure from multifocal pneumonia and pulm edema with acute COPD exacerbation  PULMONARY-COPD exacerbation with pneumonia resp status tenious -continue oxygen as needed, BiPAP if needed -started albuterol nebs -started pulmicort nebs -started inhaled steroids  CARDIOVASCULAR Needs ICU monitoring  RENAL ESRD on HD -follow nephrology recs -ffollow chem 7  GIB-follow up Gi recs  HEMATOLOGIC Follow h/h -transfusion as needed  INFECTIOUS Multifocal pneumonia-cotninue vanc/zosyn/azithromycin day 4 -sputum cultures pending   ENDOCRINE - ICU hypoglycemic\Hyperglycemia protocol   I have personally obtained a history, examined the patient, evaluated laboratory and independently reviewed  imaging results, formulated the assessment and plan and placed orders.  The Patient requires high complexity decision making for assessment and support, frequent evaluation and titration of therapies, application of advanced monitoring technologies and extensive interpretation of multiple databases. 35 minutes spent with patient   Overall,prognosis is guarded.    Lucie Leather, M.D.  [facility]  Medical Director [facility] Medical Director [facility]  Cardio-Pulmonary Department

## 2014-11-16 DIAGNOSIS — R0602 Shortness of breath: Secondary | ICD-10-CM

## 2014-11-16 DIAGNOSIS — Z87891 Personal history of nicotine dependence: Secondary | ICD-10-CM

## 2014-11-16 DIAGNOSIS — M1990 Unspecified osteoarthritis, unspecified site: Secondary | ICD-10-CM

## 2014-11-16 DIAGNOSIS — E8770 Fluid overload, unspecified: Secondary | ICD-10-CM

## 2014-11-16 DIAGNOSIS — J80 Acute respiratory distress syndrome: Secondary | ICD-10-CM

## 2014-11-16 DIAGNOSIS — M8580 Other specified disorders of bone density and structure, unspecified site: Secondary | ICD-10-CM

## 2014-11-16 DIAGNOSIS — R571 Hypovolemic shock: Secondary | ICD-10-CM

## 2014-11-16 DIAGNOSIS — Z66 Do not resuscitate: Secondary | ICD-10-CM

## 2014-11-16 DIAGNOSIS — D696 Thrombocytopenia, unspecified: Secondary | ICD-10-CM

## 2014-11-16 DIAGNOSIS — J9621 Acute and chronic respiratory failure with hypoxia: Secondary | ICD-10-CM

## 2014-11-16 DIAGNOSIS — F419 Anxiety disorder, unspecified: Secondary | ICD-10-CM

## 2014-11-16 DIAGNOSIS — I350 Nonrheumatic aortic (valve) stenosis: Secondary | ICD-10-CM

## 2014-11-16 DIAGNOSIS — Z515 Encounter for palliative care: Secondary | ICD-10-CM

## 2014-11-16 DIAGNOSIS — J441 Chronic obstructive pulmonary disease with (acute) exacerbation: Secondary | ICD-10-CM

## 2014-11-16 DIAGNOSIS — I82402 Acute embolism and thrombosis of unspecified deep veins of left lower extremity: Secondary | ICD-10-CM

## 2014-11-16 DIAGNOSIS — F329 Major depressive disorder, single episode, unspecified: Secondary | ICD-10-CM

## 2014-11-16 DIAGNOSIS — K219 Gastro-esophageal reflux disease without esophagitis: Secondary | ICD-10-CM

## 2014-11-16 DIAGNOSIS — J449 Chronic obstructive pulmonary disease, unspecified: Secondary | ICD-10-CM

## 2014-11-16 DIAGNOSIS — N2581 Secondary hyperparathyroidism of renal origin: Secondary | ICD-10-CM

## 2014-11-16 DIAGNOSIS — K2970 Gastritis, unspecified, without bleeding: Secondary | ICD-10-CM

## 2014-11-16 DIAGNOSIS — J189 Pneumonia, unspecified organism: Secondary | ICD-10-CM

## 2014-11-16 DIAGNOSIS — E669 Obesity, unspecified: Secondary | ICD-10-CM

## 2014-11-16 DIAGNOSIS — I120 Hypertensive chronic kidney disease with stage 5 chronic kidney disease or end stage renal disease: Secondary | ICD-10-CM

## 2014-11-16 LAB — CBC
HCT: 22.2 % — ABNORMAL LOW (ref 35.0–47.0)
Hemoglobin: 7.3 g/dL — ABNORMAL LOW (ref 12.0–16.0)
MCH: 32.3 pg (ref 26.0–34.0)
MCHC: 32.8 g/dL (ref 32.0–36.0)
MCV: 98.4 fL (ref 80.0–100.0)
PLATELETS: 158 10*3/uL (ref 150–440)
RBC: 2.25 MIL/uL — ABNORMAL LOW (ref 3.80–5.20)
RDW: 16.2 % — ABNORMAL HIGH (ref 11.5–14.5)
WBC: 6.4 10*3/uL (ref 3.6–11.0)

## 2014-11-16 LAB — RENAL FUNCTION PANEL
Albumin: 2.8 g/dL — ABNORMAL LOW (ref 3.5–5.0)
Anion gap: 10 (ref 5–15)
BUN: 38 mg/dL — ABNORMAL HIGH (ref 6–20)
CHLORIDE: 99 mmol/L — AB (ref 101–111)
CO2: 33 mmol/L — ABNORMAL HIGH (ref 22–32)
Calcium: 8.3 mg/dL — ABNORMAL LOW (ref 8.9–10.3)
Creatinine, Ser: 4.03 mg/dL — ABNORMAL HIGH (ref 0.44–1.00)
GFR calc non Af Amer: 10 mL/min — ABNORMAL LOW (ref >60)
GFR, EST AFRICAN AMERICAN: 12 mL/min — AB (ref >60)
GLUCOSE: 115 mg/dL — AB (ref 65–99)
PHOSPHORUS: 1.8 mg/dL — AB (ref 2.5–4.6)
Potassium: 4.3 mmol/L (ref 3.5–5.1)
Sodium: 142 mmol/L (ref 135–145)

## 2014-11-16 MED ORDER — VANCOMYCIN HCL IN DEXTROSE 750-5 MG/150ML-% IV SOLN
750.0000 mg | Freq: Once | INTRAVENOUS | Status: AC
  Administered 2014-11-16: 750 mg via INTRAVENOUS
  Filled 2014-11-16 (×2): qty 150

## 2014-11-16 MED ORDER — DOCUSATE SODIUM 100 MG PO CAPS
100.0000 mg | ORAL_CAPSULE | Freq: Two times a day (BID) | ORAL | Status: DC
  Administered 2014-11-16 – 2014-11-21 (×11): 100 mg via ORAL
  Filled 2014-11-16 (×11): qty 1

## 2014-11-16 MED ORDER — EPOETIN ALFA 10000 UNIT/ML IJ SOLN
10000.0000 [IU] | Freq: Once | INTRAMUSCULAR | Status: AC
  Administered 2014-11-16: 10000 [IU] via INTRAVENOUS
  Filled 2014-11-16: qty 1

## 2014-11-16 NOTE — Progress Notes (Addendum)
[facility] Medicine Progess Note    ASSESSMENT / PLAN: DVT (deep venous thrombosis) -Status post IVC filter placement. It is possible that the patient suffered a pulmonary embolism before the IVC filter was placed which may be contributing to her acute respiratory failure. -Continue anticoagulation  Acute respiratory failure with hypoxemia -This is likely multifactorial due to pneumonia COPD and pulmonary edema due to volume overload. Will wean down high flow oxygen as tolerated.  HAP (hospital-acquired pneumonia) -Continue antibiotics.  COPD exacerbation -Continue steroids and wean as tolerated.  UGI bleed -GI following will continue to monitor hemoglobins appears to be stable at this time  ESRD on dialysis -Discussed with nephrology the patient continues to be on dialysis we'll monitor her progress.  Aortic valvar stenosis -Echocardiogram showed moderate to severe aortic stenosis likely contributing to her pulmonary edema and respiratory failure. -Cardiology following.     ----------------------------------------   Name: Ariel Smith MRN: [id number] DOB: Aug 19, 1945    ADMISSION DATE:  11/09/2014  SUBJECTIVE:   Patient feels that her breathing is doing slightly better than yesterday discussed case with upper family members were present in the room time. Case was discussed with RN and with family at bedside. The patient has no new complaints today  Review of Systems:  Constitutional: Feels well. Cardiovascular: No chest pain.  Pulmonary: Denies dyspnea.   The remainder of systems were reviewed and were found to be negative other than what is documented in the HPI.    VITAL SIGNS: Temp:  [97.9 F (36.6 C)-98.6 F (37 C)] 97.9 F (36.6 C) (07/29 0753) Pulse Rate:  [93-120] 100 (07/29 1000) Resp:  [14-23] 21 (07/29 1000) BP: (62-158)/(44-88) 131/64 mmHg (07/29 1000) SpO2:  [88 %-100 %] 96 % (07/29 1000) FiO2 (%):  [40 %-70 %] 40 % (07/29  0216) Weight:  [102.5 kg (225 lb 15.5 oz)] 102.5 kg (225 lb 15.5 oz) (07/28 1252) HEMODYNAMICS:   VENTILATOR SETTINGS: Vent Mode:  [-]  FiO2 (%):  [40 %-70 %] 40 % INTAKE / OUTPUT:  Intake/Output Summary (Last 24 hours) at 11/16/14 1127 Last data filed at 11/16/14 1032  Gross per 24 hour  Intake   1450 ml  Output   3000 ml  Net  -1550 ml    PHYSICAL EXAMINATION: Physical Examination:   VS: BP 131/64 mmHg  Pulse 100  Temp(Src) 97.9 F (36.6 C) (Oral)  Resp 21  Ht  (1.626 m)  Wt 102.5 kg (225 lb 15.5 oz)  BMI 38.77 kg/m2  SpO2 96%  General Appearance: No distress  Neuro:without focal findings, mental status normal. HEENT: PERRLA, EOM intact. Pulmonary: normal breath sounds   CardiovascularNormal S1,S2.  No m/r/g.   Abdomen: Benign, Soft, non-tender. Renal:  No costovertebral tenderness  GU:  Not performed at this time. Endocrine: No evident thyromegaly. Skin:   warm, no rashes, no ecchymosis  Extremities: normal, no cyanosis, clubbing.   LABS:  CBC  Recent Labs Lab 11/10/14 0538  11/10/14 1542 11/11/14 0641  11/14/14 0505 11/15/14 0438 11/16/14 0444  WBC 4.9  --   --  6.0  --   --   --  6.4  HGB 7.1*  < > 6.8* 8.1*  < > 7.3* 7.6* 7.3*  HCT 21.1*  < > 20.1* 23.9*  --   --   --  22.2*  PLT 136*  --   --  132*  --   --   --  158  < > = values in this  interval not displayed. Coag's  Recent Labs Lab 11/10/14 0538  11/10/14 2242 11/11/14 0641 11/12/14 0633  APTT 32  --   --   --   --   INR 1.20  < > 1.09 1.07 1.10  < > = values in this interval not displayed. BMET  Recent Labs Lab 11/10/14 0538 11/12/14 0640 11/13/14 0736  NA 140 141 136  K 4.1 4.5 4.6  CL 101 101 98*  CO2 30 30 26   BUN 41* 27* 50*  CREATININE 4.27* 4.55* 6.57*  GLUCOSE 83 98 172*   Electrolytes  Recent Labs Lab 11/10/14 0538 11/12/14 0640 11/13/14 0736  CALCIUM 7.6* 8.0* 8.0*  PHOS  --  4.0 4.9*   Sepsis Markers No results for input(s): LATICACIDVEN,  PROCALCITON, O2SATVEN in the last 168 hours. ABG No results for input(s): PHART, PCO2ART, PO2ART in the last 168 hours. Liver Enzymes  Recent Labs Lab 11/09/14 1132 11/12/14 0640 11/13/14 0736  AST 25  --   --   ALT 18  --   --   ALKPHOS 58  --   --   BILITOT 0.4  --   --   ALBUMIN 3.2* 2.8* 2.9*   Cardiac Enzymes No results for input(s): TROPONINI, PROBNP in the last 168 hours. Glucose No results for input(s): GLUCAP in the last 168 hours.  Imaging No results found.   Wells Guiles, MD.  Pager [phone] [facility] Office Number: [phone]  Santiago Glad, M.D.  Stephanie Acre, M.D.  Carolyne Fiscal, M.D  Critical Care Attestation.  I have personally obtained a history, examined the patient, evaluated laboratory and imaging results, formulated the assessment and plan and placed orders. The Patient requires high complexity decision making for assessment and support, frequent evaluation and titration of therapies, application of advanced monitoring technologies and extensive interpretation of multiple databases. The patient has critical illness that could lead imminently to failure of 1 or more organ systems and requires the highest level of physician preparedness to intervene.  Critical Care Time devoted to patient care services described in this note is 35 minutes and is exclusive of time spent in procedures.

## 2014-11-16 NOTE — Assessment & Plan Note (Signed)
-  Echocardiogram showed moderate to severe aortic stenosis likely contributing to her pulmonary edema and respiratory failure. -Cardiology following.

## 2014-11-16 NOTE — Assessment & Plan Note (Signed)
Continue antibiotics

## 2014-11-16 NOTE — Care Management Important Message (Signed)
Important Message  Patient Details  Name: Ariel Smith MRN: [id number] Date of Birth: 1946/01/21   Medicare Important Message Given:  Yes-fourth notification given    Eber Hong, RN 11/16/2014, 3:57 PM

## 2014-11-16 NOTE — Assessment & Plan Note (Addendum)
-  Status post IVC filter placement. It is possible that the patient suffered a pulmonary embolism before the IVC filter was placed which may be contributing to her acute respiratory failure. --Failed outpatient warfarin. Will wait for GI to clear for clearance for anticoagulation.

## 2014-11-16 NOTE — Assessment & Plan Note (Addendum)
-  This is likely multifactorial due to pneumonia COPD and pulmonary edema due to volume overload. Will wean down high flow oxygen as tolerated.

## 2014-11-16 NOTE — Assessment & Plan Note (Addendum)
-  Doing better. We will wean steroids. Further

## 2014-11-16 NOTE — Assessment & Plan Note (Addendum)
-  GI following will continue to monitor hemoglobins appears to be stable at this time

## 2014-11-16 NOTE — Progress Notes (Signed)
[facility]  ROUNDING NOTE   Subjective:   Continues on HFNC Daughter at bedside. States not coughing as much Hemodialysis yesterday. Plan on dialysis again later today.   Objective:  Vital signs in last 24 hours:  Temp:  [97.9 F (36.6 C)-98.6 F (37 C)] 97.9 F (36.6 C) (07/29 0753) Pulse Rate:  [93-120] 95 (07/29 0800) Resp:  [14-27] 18 (07/29 0800) BP: (62-158)/(44-84) 139/75 mmHg (07/29 0800) SpO2:  [88 %-100 %] 90 % (07/29 0800) FiO2 (%):  [40 %-70 %] 40 % (07/29 0216) Weight:  [102.5 kg (225 lb 15.5 oz)-105 kg (231 lb 7.7 oz)] 102.5 kg (225 lb 15.5 oz) (07/28 1252)  Weight change: 1.9 kg (4 lb 3 oz) Filed Weights   11/15/14 0500 11/15/14 0930 11/15/14 1252  Weight: 101.6 kg (223 lb 15.8 oz) 105 kg (231 lb 7.7 oz) 102.5 kg (225 lb 15.5 oz)    Intake/Output: I/O last 3 completed shifts: In: 1857 [P.O.:1320; NG/GT:237; IV Piggyback:300] Out: 3000 [Other:3000]   Intake/Output this shift:     Physical Exam: General: Critically ill appearing  Head: Normocephalic, atraumatic. HFNC  Eyes: Anicteric  Neck: Supple, trachea midline  Lungs:  diminished  Heart: regular  Abdomen:  Soft, nontender, BS present  Extremities:  no peripheral edema.  Neurologic: Nonfocal, moving all four extremities  Skin: No lesions  Access: LUE AVF    Basic Metabolic Panel:  Recent Labs Lab 11/09/14 1132 11/10/14 0538 11/12/14 0640 11/13/14 0736  NA 138 140 141 136  K 4.2 4.1 4.5 4.6  CL 98* 101 101 98*  CO2 27 30 30 26   GLUCOSE 142* 83 98 172*  BUN 59* 41* 27* 50*  CREATININE 5.49* 4.27* 4.55* 6.57*  CALCIUM 7.9* 7.6* 8.0* 8.0*  PHOS  --   --  4.0 4.9*    Liver Function Tests:  Recent Labs Lab 11/09/14 1132 11/12/14 0640 11/13/14 0736  AST 25  --   --   ALT 18  --   --   ALKPHOS 58  --   --   BILITOT 0.4  --   --   PROT 5.9*  --   --   ALBUMIN 3.2* 2.8* 2.9*   No results for input(s): LIPASE, AMYLASE in the last 168 hours. No results for  input(s): AMMONIA in the last 168 hours.  CBC:  Recent Labs Lab 11/09/14 1132  11/10/14 0538 11/10/14 0941 11/10/14 1542 11/11/14 0641 11/12/14 6837 11/13/14 0736 11/14/14 0505 11/15/14 0438 11/16/14 0444  WBC 6.6  --  4.9  --   --  6.0  --   --   --   --  6.4  HGB 6.4*  < > 7.1* 7.4* 6.8* 8.1* 7.7* 8.1* 7.3* 7.6* 7.3*  HCT 18.9*  < > 21.1* 21.3* 20.1* 23.9*  --   --   --   --  22.2*  MCV 103.7*  --  95.3  --   --  96.5  --   --   --   --  98.4  PLT 196  --  136*  --   --  132*  --   --   --   --  158  < > = values in this interval not displayed.  Cardiac Enzymes: No results for input(s): CKTOTAL, CKMB, CKMBINDEX, TROPONINI in the last 168 hours.  BNP: Invalid input(s): POCBNP  CBG: No results for input(s): GLUCAP in the last 168 hours.  Microbiology: Results for orders placed or performed during the  hospital encounter of 11/09/14  MRSA PCR Screening     Status: None   Collection Time: 11/09/14  9:40 PM  Result Value Ref Range Status   MRSA by PCR NEGATIVE NEGATIVE Final    Comment:        The GeneXpert MRSA Assay (FDA approved for NASAL specimens only), is one component of a comprehensive MRSA colonization surveillance program. It is not intended to diagnose MRSA infection nor to guide or monitor treatment for MRSA infections.     Coagulation Studies: No results for input(s): LABPROT, INR in the last 72 hours.  Urinalysis: No results for input(s): COLORURINE, LABSPEC, PHURINE, GLUCOSEU, HGBUR, BILIRUBINUR, KETONESUR, PROTEINUR, UROBILINOGEN, NITRITE, LEUKOCYTESUR in the last 72 hours.  Invalid input(s): APPERANCEUR    Imaging: Dg Chest 1 View  11/14/2014   CLINICAL DATA:  Central line placement  EXAM: CHEST  1 VIEW  COMPARISON:  11/12/2014  FINDINGS: Left internal jugular central venous catheter place with its tip in the mid SVC and no left pneumothorax. Heart remains borderline enlarged. Bilateral airspace disease with a central and basilar distribution  has worsened.  IMPRESSION: Left internal jugular central venous catheter place with its tip in the mid SVC and no pneumothorax.  Worsening bilateral airspace disease.   Electronically Signed   By: Jolaine Click M.D.   On: 11/14/2014 11:18     Medications:     . ALPRAZolam  1 mg Oral TID  . budesonide (PULMICORT) nebulizer solution  0.25 mg Nebulization BID  . budesonide-formoterol  2 puff Inhalation BID  . calcitRIOL  0.25 mcg Oral Daily  . cinacalcet  60 mg Oral Q breakfast  . citalopram  20 mg Oral Daily  . donepezil  5 mg Oral QHS  . feeding supplement (NEPRO CARB STEADY)  237 mL Oral BID BM  . ipratropium-albuterol  3 mL Nebulization Q6H  . methylPREDNISolone (SOLU-MEDROL) injection  40 mg Intravenous Daily  . multivitamin with minerals  1 tablet Oral Daily  . pantoprazole (PROTONIX) IV  40 mg Intravenous Q12H  . piperacillin-tazobactam (ZOSYN)  IV  3.375 g Intravenous Q12H  . pravastatin  20 mg Oral q1800  . sevelamer carbonate  2,400 mg Oral TID WC  . traZODone  25-50 mg Oral QHS  . vancomycin  750 mg Intravenous Q T,Th,Sa-HD   sodium chloride, sodium chloride, albuterol, benzonatate, chlorpheniramine-HYDROcodone, hydrOXYzine, lidocaine (PF), lidocaine-prilocaine, menthol-cetylpyridinium, pentafluoroprop-tetrafluoroeth, sevelamer carbonate, traMADol, traMADol, zolpidem  Assessment/ Plan:  69 y.o. female with past medical history of ESRD on HD TTHS, SHPTH, AOCD, HTN, hyperlipidemia, COPD, DVT with PE, tx with long term coumadin and IVC filter placement, L knee TKA, R shoulder surgery  [facility] [address] [facility]  1. End-stage renal disease on hemodialysis: three days of hemodialysis. UF of 9 litres total. UF of goal 3 litre today.  Monitor daily for dialysis need.   2. Anemia of chronic kidney disease/anemia of blood loss. Hemoglobin 7.3. The patient was having black tarry stools at home. Gastritis on EGD - status post transfusion PRBC on 7/22 - epo with HD.   3. Secondary  hyperparathyroidism. Continue renvela 2400mg  po tid/wm. - PTH 713 outpatient, not well controlled.   - on calcitriol and cinacalcet.   4. Acute exacerbation of COPD with Respiratory Distress: improved clinically - solumedrol, empiric vanc and zosyn.  - nebs - Continue oxygen.  - echocardiogram pending.  - if no improvement, will reconsider another CT with contrast.   5. Actue DVT: with history of DVT. Status post  IVC placed 7/25 [name].  - was on warfarin in the past.  - currently not on anticoagulation due to active GI bleed. Will need something like Eliquis on discharge. Failed outpatient warfarin. Will wait for GI to clear for clearance for anticoagulation.    LOS: 7 Nely Dedmon 7/29/20168:10 AM

## 2014-11-16 NOTE — Care Management (Signed)
Patient lives at home with her husband.  She use to perform home dialysis but had to go in center about one year ago.  Patient nor daughter is able to specify whether this was peritoneal or home hemodialysis.  She has nocturnal 02 through [facility].    She was still on high flow nasal cannula this morning but now she is on nasal cannula.  Family feels that patient will qualify for continuous home 02.  Discussed that patient will be assessed closer to discharge for the need of home 02.  Would be agreeable to have home health nurse and physical therapy at discharge if able to discharge directly home.  She is followed by [facility] on [address]  Family "wants patient to have a qualify to her life.  Wants patient to be able to go out and have fun on non dialysis days and have fun.  Patient does not add much to the conversation of quality of life.

## 2014-11-16 NOTE — Progress Notes (Signed)
Patient ID: Ariel Smith, female   DOB: 1945/06/22, 69 y.o.   MRN: [id number]   [facility] PROGRESS NOTE  PCP: Ariel Boyden, MD  HPI/Subjective: Patient switched off high flow nasal cannula O2 nasal cannula 4 L late this morning. She's feeling a little bit better with regards to her breathing. Still coughing quite a bit. Has not had a bowel movement in a few days.  Objective: Filed Vitals:   11/16/14 1515  BP: 139/79  Pulse: 98  Temp:   Resp: 21    Filed Weights   11/15/14 0930 11/15/14 1252 11/16/14 1200  Weight: 105 kg (231 lb 7.7 oz) 102.5 kg (225 lb 15.5 oz) 105 kg (231 lb 7.7 oz)    ROS: Review of Systems  Constitutional: Negative for fever and chills.  Eyes: Negative for blurred vision.  Respiratory: Positive for cough and shortness of breath. Negative for sputum production.   Cardiovascular: Negative for chest pain.  Gastrointestinal: Negative for nausea, vomiting, abdominal pain, diarrhea and constipation.  Genitourinary: Negative for dysuria.  Musculoskeletal: Positive for neck pain. Negative for joint pain.  Neurological: Negative for dizziness and headaches.   Exam: Physical Exam  Constitutional: She is oriented to person, place, and time.  HENT:  Nose: No mucosal edema.  Mouth/Throat: No oropharyngeal exudate or posterior oropharyngeal edema.  Eyes: Conjunctivae, EOM and lids are normal. Pupils are equal, round, and reactive to light.  Neck: No JVD present. Carotid bruit is not present. No edema present. No thyroid mass and no thyromegaly present.  Cardiovascular: Regular rhythm, S1 normal and S2 normal.  Exam reveals no gallop.   Murmur heard.  Systolic murmur is present with a grade of 3/6  Pulses:      Dorsalis pedis pulses are 2+ on the right side, and 2+ on the left side.  Respiratory: No respiratory distress. She has decreased breath sounds in the right lower field and the left lower field. She has no wheezes. She has rhonchi in  the right lower field and the left lower field. She has no rales.  GI: Soft. Bowel sounds are normal. There is no tenderness.  Musculoskeletal:       Right ankle: She exhibits swelling.       Left ankle: She exhibits swelling.  Lymphadenopathy:    She has no cervical adenopathy.  Neurological: She is alert and oriented to person, place, and time. No cranial nerve deficit.  Skin: Skin is warm. No rash noted. Nails show no clubbing.  Psychiatric: She has a normal mood and affect.    Data Reviewed: Basic Metabolic Panel:  Recent Labs Lab 11/10/14 0538 11/12/14 0640 11/13/14 0736 11/16/14 1224  NA 140 141 136 142  K 4.1 4.5 4.6 4.3  CL 101 101 98* 99*  CO2 30 30 26  33*  GLUCOSE 83 98 172* 115*  BUN 41* 27* 50* 38*  CREATININE 4.27* 4.55* 6.57* 4.03*  CALCIUM 7.6* 8.0* 8.0* 8.3*  PHOS  --  4.0 4.9* 1.8*   Liver Function Tests:  Recent Labs Lab 11/12/14 0640 11/13/14 0736 11/16/14 1224  ALBUMIN 2.8* 2.9* 2.8*   CBC:  Recent Labs Lab 11/10/14 0538 11/10/14 0941 11/10/14 1542 11/11/14 0641 11/12/14 7591 11/13/14 0736 11/14/14 0505 11/15/14 0438 11/16/14 0444  WBC 4.9  --   --  6.0  --   --   --   --  6.4  HGB 7.1* 7.4* 6.8* 8.1* 7.7* 8.1* 7.3* 7.6* 7.3*  HCT 21.1* 21.3* 20.1* 23.9*  --   --   --   --  22.2*  MCV 95.3  --   --  96.5  --   --   --   --  98.4  PLT 136*  --   --  132*  --   --   --   --  158       Studies: No results found.  Scheduled Meds: . ALPRAZolam  1 mg Oral TID  . budesonide (PULMICORT) nebulizer solution  0.25 mg Nebulization BID  . calcitRIOL  0.25 mcg Oral Daily  . cinacalcet  60 mg Oral Q breakfast  . citalopram  20 mg Oral Daily  . docusate sodium  100 mg Oral BID  . donepezil  5 mg Oral QHS  . feeding supplement (NEPRO CARB STEADY)  237 mL Oral BID BM  . ipratropium-albuterol  3 mL Nebulization Q6H  . methylPREDNISolone (SOLU-MEDROL) injection  40 mg Intravenous Daily  . multivitamin with minerals  1 tablet Oral Daily  .  pantoprazole (PROTONIX) IV  40 mg Intravenous Q12H  . piperacillin-tazobactam (ZOSYN)  IV  3.375 g Intravenous Q12H  . pravastatin  20 mg Oral q1800  . sevelamer carbonate  2,400 mg Oral TID WC  . traZODone  25-50 mg Oral QHS  . vancomycin  750 mg Intravenous Q T,Th,Sa-HD    Assessment/Plan:  1. Acute on chronic respiratory failure with hypoxia. The patient was able to come off high flow nasal cannula to regular nasal cannula 4 L late this morning.  Continue low-dose Solu-Medrol 40 mg daily.  Likely combination of fluid overload, pneumonia and even possibly pulmonary embolism prior to IVC filter placement. 2. Acute pneumonia bilaterally.  Continue vancomycin and Zosyn. 3. Fluid overload with IV fluids and blood-  dialysis to remove fluid. Patient currently receiving dialysis. 4.   Acute blood loss anemia- patient's hemoglobin today is 7.3.  Likely will end up needing another              unit of blood during the hospital course. Patient's respiratory status to tenuous for a colonoscopy          at this point. 5.   Likely upper GI bleed- endoscopy only showed gastritis. Patient is on Protonix. Advanced diet. 6.   Hypovolemic shock- blood pressure on the lower side. Continue to monitor. 7.   End-stage renal disease on hemodialysis- hemodialysis as per nephrology. 8.   Left lower extremity DVT- s/p IVC filter. 9.   DNR  Disposition Plan: To be determined  Consultants:  Gastrointestinal  Nephrology  Vascular surgery  Pulmonary  Procedures:  Endoscopy  IVC filter placement  Time spent: 20 minutes  Alford Highland  [facility]

## 2014-11-16 NOTE — Assessment & Plan Note (Addendum)
-  Discussed with nephrology the patient continues to be on dialysis and appears to be making very good progress

## 2014-11-17 ENCOUNTER — Inpatient Hospital Stay: Payer: Medicare Other

## 2014-11-17 DIAGNOSIS — K922 Gastrointestinal hemorrhage, unspecified: Secondary | ICD-10-CM | POA: Insufficient documentation

## 2014-11-17 DIAGNOSIS — J9601 Acute respiratory failure with hypoxia: Secondary | ICD-10-CM

## 2014-11-17 DIAGNOSIS — D500 Iron deficiency anemia secondary to blood loss (chronic): Secondary | ICD-10-CM | POA: Insufficient documentation

## 2014-11-17 DIAGNOSIS — I350 Nonrheumatic aortic (valve) stenosis: Secondary | ICD-10-CM

## 2014-11-17 DIAGNOSIS — Z992 Dependence on renal dialysis: Secondary | ICD-10-CM

## 2014-11-17 DIAGNOSIS — I82409 Acute embolism and thrombosis of unspecified deep veins of unspecified lower extremity: Secondary | ICD-10-CM

## 2014-11-17 DIAGNOSIS — N186 End stage renal disease: Secondary | ICD-10-CM

## 2014-11-17 DIAGNOSIS — R0600 Dyspnea, unspecified: Secondary | ICD-10-CM

## 2014-11-17 LAB — BLOOD GAS, ARTERIAL
ACID-BASE EXCESS: 12.7 mmol/L — AB (ref 0.0–3.0)
Allens test (pass/fail): POSITIVE — AB
Bicarbonate: 37.5 mEq/L — ABNORMAL HIGH (ref 21.0–28.0)
FIO2: 0.36
O2 Saturation: 90.3 %
PATIENT TEMPERATURE: 37
PH ART: 7.51 — AB (ref 7.350–7.450)
pCO2 arterial: 47 mmHg (ref 32.0–48.0)
pO2, Arterial: 53 mmHg — ABNORMAL LOW (ref 83.0–108.0)

## 2014-11-17 LAB — BASIC METABOLIC PANEL
ANION GAP: 10 (ref 5–15)
BUN: 30 mg/dL — ABNORMAL HIGH (ref 6–20)
CALCIUM: 8.1 mg/dL — AB (ref 8.9–10.3)
CO2: 33 mmol/L — ABNORMAL HIGH (ref 22–32)
Chloride: 98 mmol/L — ABNORMAL LOW (ref 101–111)
Creatinine, Ser: 3.4 mg/dL — ABNORMAL HIGH (ref 0.44–1.00)
GFR calc non Af Amer: 13 mL/min — ABNORMAL LOW (ref >60)
GFR, EST AFRICAN AMERICAN: 15 mL/min — AB (ref >60)
Glucose, Bld: 97 mg/dL (ref 65–99)
Potassium: 4 mmol/L (ref 3.5–5.1)
Sodium: 141 mmol/L (ref 135–145)

## 2014-11-17 LAB — CBC
HCT: 22.3 % — ABNORMAL LOW (ref 35.0–47.0)
HEMOGLOBIN: 7.4 g/dL — AB (ref 12.0–16.0)
MCH: 32.4 pg (ref 26.0–34.0)
MCHC: 33 g/dL (ref 32.0–36.0)
MCV: 98.2 fL (ref 80.0–100.0)
Platelets: 183 10*3/uL (ref 150–440)
RBC: 2.27 MIL/uL — AB (ref 3.80–5.20)
RDW: 16.3 % — ABNORMAL HIGH (ref 11.5–14.5)
WBC: 6.9 10*3/uL (ref 3.6–11.0)

## 2014-11-17 LAB — VANCOMYCIN, TROUGH: Vancomycin Tr: 14 ug/mL (ref 10–20)

## 2014-11-17 MED ORDER — EPOETIN ALFA 10000 UNIT/ML IJ SOLN
10000.0000 [IU] | Freq: Once | INTRAMUSCULAR | Status: AC
  Administered 2014-11-17: 10000 [IU] via INTRAVENOUS
  Filled 2014-11-17: qty 1

## 2014-11-17 MED ORDER — METHYLPREDNISOLONE SODIUM SUCC 40 MG IJ SOLR
20.0000 mg | Freq: Every day | INTRAMUSCULAR | Status: DC
  Administered 2014-11-18: 20 mg via INTRAVENOUS
  Filled 2014-11-17: qty 1

## 2014-11-17 NOTE — Progress Notes (Signed)
Patient placed on 45% O2 via ventimask due to O2 desaturation.  Slept off and on throughtout the night.  No c/o's voiced.

## 2014-11-17 NOTE — Progress Notes (Signed)
ANTIBIOTIC CONSULT NOTE - FOLLOW UP  Pharmacy Consult for Vancomycin/Zosyn Indication: rule out pneumonia  Allergies  Allergen Reactions  . Other Other (See Comments)    Pt states that she is allergic to Midrin and it causes headaches.     Patient Measurements: Height: 5\' 4"  (162.6 cm) Weight: 228 lb 13.4 oz (103.8 kg) IBW/kg (Calculated) : 54.7   Vital Signs: Temp: 98.3 F (36.8 C) (07/30 0738) Temp Source: Oral (07/30 0738) BP: 100/69 mmHg (07/30 1230) Pulse Rate: 85 (07/30 1230) Intake/Output from previous day: 07/29 0701 - 07/30 0700 In: 800 [P.O.:600; IV Piggyback:200] Out: 3500  Intake/Output from this shift: Total I/O In: 360 [P.O.:360] Out: -   Labs:  Recent Labs  11/15/14 0438 11/16/14 0444 11/16/14 1224 11/17/14 0540  WBC  --  6.4  --  6.9  HGB 7.6* 7.3*  --  7.4*  PLT  --  158  --  183  CREATININE  --   --  4.03* 3.40*   Estimated Creatinine Clearance: 18.6 mL/min (by C-G formula based on Cr of 3.4).  Recent Labs  11/15/14 1338 11/17/14 1102  VANCOTROUGH  --  14  VANCORANDOM 22  --      Microbiology: Recent Results (from the past 720 hour(s))  MRSA PCR Screening     Status: None   Collection Time: 11/09/14  9:40 PM  Result Value Ref Range Status   MRSA by PCR NEGATIVE NEGATIVE Final    Comment:        The GeneXpert MRSA Assay (FDA approved for NASAL specimens only), is one component of a comprehensive MRSA colonization surveillance program. It is not intended to diagnose MRSA infection nor to guide or monitor treatment for MRSA infections.     Anti-infectives    Start     Dose/Rate Route Frequency Ordered Stop   11/16/14 1200  vancomycin (VANCOCIN) IVPB 750 mg/150 ml premix     750 mg 150 mL/hr over 60 Minutes Intravenous  Once 11/16/14 1113 11/16/14 1512   11/13/14 1200  vancomycin (VANCOCIN) IVPB 750 mg/150 ml premix     750 mg 150 mL/hr over 60 Minutes Intravenous Every T-Th-Sa (Hemodialysis) 11/12/14 1120     11/12/14  1130  vancomycin (VANCOCIN) 1,500 mg in sodium chloride 0.9 % 500 mL IVPB     1,500 mg 250 mL/hr over 120 Minutes Intravenous  Once 11/12/14 1117 11/12/14 1649   11/12/14 1030  piperacillin-tazobactam (ZOSYN) IVPB 3.375 g     3.375 g 12.5 mL/hr over 240 Minutes Intravenous Every 12 hours 11/12/14 1004     11/12/14 1015  vancomycin (VANCOCIN) IVPB 1000 mg/200 mL premix  Status:  Discontinued     1,000 mg 200 mL/hr over 60 Minutes Intravenous  Once 11/12/14 1004 11/12/14 1117      Assessment: 69 y/o F with ESRD on HD admitted for acute blood loss anemia ordered empiric abx for COPD exacerbation, r/o PNA. Patient received extra HD session yesterday.   Goal of Therapy:  Pre-HD vancomycin trough: 15-25  Plan:  Ordered random vancomycin level due to patient receiving extra HD session yesterday and no vancomycin dose. Vancomycin level is at goal. No need for further supplementation at this time.   7/30 Trough = 14.  No change to dose at this time.  Felishia Wartman K 11/17/2014,12:37 PM

## 2014-11-17 NOTE — Assessment & Plan Note (Signed)
-  Currently on nasal cannula weaned from high flow oxygen. She looks much better today.

## 2014-11-17 NOTE — Plan of Care (Signed)
Problem: Phase I Progression Outcomes Goal: OOB as tolerated unless otherwise ordered Outcome: Progressing Up to bedside recliner chair. 2 person assist.

## 2014-11-17 NOTE — Progress Notes (Signed)
Patient ID: Ariel Smith, female   DOB: 1945/10/11, 69 y.o.   MRN: [id number]   [facility] PROGRESS NOTE  PCP: [name], MD  HPI/Subjective: Patient currently getting dialyzed. Hemoglobins around 7.4 and stable  Objective: Filed Vitals:   11/17/14 1230  BP: 100/69  Pulse: 85  Temp:   Resp: 17    Filed Weights   11/16/14 1200 11/16/14 1526 11/17/14 1010  Weight: 105 kg (231 lb 7.7 oz) 100.6 kg (221 lb 12.5 oz) 103.8 kg (228 lb 13.4 oz)    ROS: Review of Systems  Constitutional: Negative for fever and chills.  Eyes: Negative for blurred vision.  Respiratory: Positive for cough and shortness of breath. Negative for sputum production.   Cardiovascular: Negative for chest pain.  Gastrointestinal: Negative for nausea, vomiting, abdominal pain, diarrhea and constipation.  Genitourinary: Negative for dysuria.  Musculoskeletal: Positive for neck pain. Negative for joint pain.  Neurological: Negative for dizziness and headaches.   Exam: Physical Exam  Constitutional: She is oriented to person, place, and time.  HENT:  Nose: No mucosal edema.  Mouth/Throat: No oropharyngeal exudate or posterior oropharyngeal edema.  Eyes: Conjunctivae, EOM and lids are normal. Pupils are equal, round, and reactive to light.  Neck: No JVD present. Carotid bruit is not present. No edema present. No thyroid mass and no thyromegaly present.  Cardiovascular: Regular rhythm, S1 normal and S2 normal.  Exam reveals no gallop.   Murmur heard.  Systolic murmur is present with a grade of 3/6  Pulses:      Dorsalis pedis pulses are 2+ on the right side, and 2+ on the left side.  Respiratory: No respiratory distress. She has decreased breath sounds in the right lower field and the left lower field. She has no wheezes. She has rhonchi in the right lower field and the left lower field. She has no rales.  GI: Soft. Bowel sounds are normal. There is no tenderness.  Musculoskeletal:        Right ankle: She exhibits swelling.       Left ankle: She exhibits swelling.  Lymphadenopathy:    She has no cervical adenopathy.  Neurological: She is alert and oriented to person, place, and time. No cranial nerve deficit.  Skin: Skin is warm. No rash noted. Nails show no clubbing.  Psychiatric: She has a normal mood and affect.    Data Reviewed: Basic Metabolic Panel:  Recent Labs Lab 11/12/14 0640 11/13/14 0736 11/16/14 1224 11/17/14 0540  NA 141 136 142 141  K 4.5 4.6 4.3 4.0  CL 101 98* 99* 98*  CO2 30 26 33* 33*  GLUCOSE 98 172* 115* 97  BUN 27* 50* 38* 30*  CREATININE 4.55* 6.57* 4.03* 3.40*  CALCIUM 8.0* 8.0* 8.3* 8.1*  PHOS 4.0 4.9* 1.8*  --    Liver Function Tests:  Recent Labs Lab 11/12/14 0640 11/13/14 0736 11/16/14 1224  ALBUMIN 2.8* 2.9* 2.8*   CBC:  Recent Labs Lab 11/10/14 1542 11/11/14 0641  11/13/14 0736 11/14/14 0505 11/15/14 0438 11/16/14 0444 11/17/14 0540  WBC  --  6.0  --   --   --   --  6.4 6.9  HGB 6.8* 8.1*  < > 8.1* 7.3* 7.6* 7.3* 7.4*  HCT 20.1* 23.9*  --   --   --   --  22.2* 22.3*  MCV  --  96.5  --   --   --   --  98.4 98.2  PLT  --  132*  --   --   --   --  158 183  < > = values in this interval not displayed.     Studies: Dg Chest 1 View  11/17/2014   CLINICAL DATA:  69 year old female with a history of dyspnea.  EXAM: CHEST  1 VIEW  COMPARISON:  11/14/2014, chest CT 11/12/2014, chest x-ray 11/12/2014  FINDINGS: Cardiomediastinal silhouette unchanged.  Low lung volumes.  Improving interstitial and airspace opacities in the hilar regions and lower lungs. Retrocardiac region not well evaluated. No large pleural effusion. No pneumothorax.  Unchanged position of left IJ approach central venous catheter.  Atherosclerotic calcifications of the aortic arch.  No displaced fracture.  IMPRESSION: Improving bilateral interstitial and airspace disease.  Unchanged central venous catheter.  Atherosclerosis.  Signed,  Yvone Neu. Loreta Ave, DO   [facility]  [facility]   Electronically Signed   By: Gilmer Mor D.O.   On: 11/17/2014 08:13    Scheduled Meds: . ALPRAZolam  1 mg Oral TID  . budesonide (PULMICORT) nebulizer solution  0.25 mg Nebulization BID  . citalopram  20 mg Oral Daily  . docusate sodium  100 mg Oral BID  . donepezil  5 mg Oral QHS  . feeding supplement (NEPRO CARB STEADY)  237 mL Oral BID BM  . ipratropium-albuterol  3 mL Nebulization Q6H  . [START ON 11/18/2014] methylPREDNISolone (SOLU-MEDROL) injection  20 mg Intravenous Daily  . multivitamin with minerals  1 tablet Oral Daily  . pantoprazole (PROTONIX) IV  40 mg Intravenous Q12H  . piperacillin-tazobactam (ZOSYN)  IV  3.375 g Intravenous Q12H  . pravastatin  20 mg Oral q1800  . sevelamer carbonate  2,400 mg Oral TID WC  . traZODone  25-50 mg Oral QHS  . vancomycin  750 mg Intravenous Q T,Th,Sa-HD    Assessment/Plan:  1. Acute on chronic respiratory failure with hypoxia. Continue oxygen via nasal cannula.  Continue low-dose Solu-Medrol 40 mg daily.  Likely combination of fluid overload, pneumonia and even possibly pulmonary embolism prior to IVC filter placement. 2. Acute pneumonia bilaterally.  Continue vancomycin and Zosyn. 3. Fluid overload with IV fluids and blood-  dialysis to remove fluid. Patient currently receiving dialysis. 4.   Acute blood loss anemia- patient's hemoglobin today is 7.3.  Likely will end up needing another              unit of blood during the hospital course. Patient's respiratory status to tenuous for a colonoscopy          at this point. 5.   Likely upper GI bleed- endoscopy only showed gastritis. Patient is on Protonix. Monitor hemoglobin 6.   Hypovolemic shock- blood pressure on the lower side. Continue to monitor. Appears to be stable today 7.   End-stage renal disease on hemodialysis- hemodialysis as per nephrology. 8.   Left lower extremity DVT- s/p IVC filter. 9.    DNR  Disposition Plan: To be determined  Consultants:  Gastrointestinal  Nephrology  Vascular surgery  Pulmonary  Procedures:  Endoscopy  IVC filter placement  Time spent: 20 minutes  Lorain Keast, [facility]  [facility]

## 2014-11-17 NOTE — Progress Notes (Signed)
[facility]  ROUNDING NOTE   Subjective:   Family at bedside. Hemodialysis yesterday. UF goal of 3.5 litres Hemodialysis later today.   Objective:  Vital signs in last 24 hours:  Temp:  [97.6 F (36.4 C)-98.8 F (37.1 C)] 98.3 F (36.8 C) (07/30 0738) Pulse Rate:  [93-115] 96 (07/30 0800) Resp:  [14-27] 18 (07/30 0800) BP: (95-148)/(50-87) 119/62 mmHg (07/30 0800) SpO2:  [82 %-100 %] 100 % (07/30 0800) FiO2 (%):  [4 %-45 %] 4 % (07/30 0600) Weight:  [100.6 kg (221 lb 12.5 oz)-105 kg (231 lb 7.7 oz)] 100.6 kg (221 lb 12.5 oz) (07/29 1526)  Weight change: 0 kg (0 lb) Filed Weights   11/15/14 1252 11/16/14 1200 11/16/14 1526  Weight: 102.5 kg (225 lb 15.5 oz) 105 kg (231 lb 7.7 oz) 100.6 kg (221 lb 12.5 oz)    Intake/Output: I/O last 3 completed shifts: In: 1090 [P.O.:840; IV Piggyback:250] Out: 3500 [Other:3500]   Intake/Output this shift:     Physical Exam: General: Resting comfortably  Head: Normocephalic, atraumatic.  Eyes: Anicteric  Neck: Supple, trachea midline  Lungs:  Diminished, 4 Litres Spencer  Heart: regular  Abdomen:  Soft, nontender, BS present  Extremities:  no peripheral edema.  Neurologic: Nonfocal, moving all four extremities  Skin: No lesions  Access: LUE AVF    Basic Metabolic Panel:  Recent Labs Lab 11/12/14 0640 11/13/14 0736 11/16/14 1224 11/17/14 0540  NA 141 136 142 141  K 4.5 4.6 4.3 4.0  CL 101 98* 99* 98*  CO2 30 26 33* 33*  GLUCOSE 98 172* 115* 97  BUN 27* 50* 38* 30*  CREATININE 4.55* 6.57* 4.03* 3.40*  CALCIUM 8.0* 8.0* 8.3* 8.1*  PHOS 4.0 4.9* 1.8*  --     Liver Function Tests:  Recent Labs Lab 11/12/14 0640 11/13/14 0736 11/16/14 1224  ALBUMIN 2.8* 2.9* 2.8*   No results for input(s): LIPASE, AMYLASE in the last 168 hours. No results for input(s): AMMONIA in the last 168 hours.  CBC:  Recent Labs Lab 11/10/14 0941 11/10/14 1542 11/11/14 0641  11/13/14 0736 11/14/14 0505 11/15/14 0438  11/16/14 0444 11/17/14 0540  WBC  --   --  6.0  --   --   --   --  6.4 6.9  HGB 7.4* 6.8* 8.1*  < > 8.1* 7.3* 7.6* 7.3* 7.4*  HCT 21.3* 20.1* 23.9*  --   --   --   --  22.2* 22.3*  MCV  --   --  96.5  --   --   --   --  98.4 98.2  PLT  --   --  132*  --   --   --   --  158 183  < > = values in this interval not displayed.  Cardiac Enzymes: No results for input(s): CKTOTAL, CKMB, CKMBINDEX, TROPONINI in the last 168 hours.  BNP: Invalid input(s): POCBNP  CBG: No results for input(s): GLUCAP in the last 168 hours.  Microbiology: Results for orders placed or performed during the hospital encounter of 11/09/14  MRSA PCR Screening     Status: None   Collection Time: 11/09/14  9:40 PM  Result Value Ref Range Status   MRSA by PCR NEGATIVE NEGATIVE Final    Comment:        The GeneXpert MRSA Assay (FDA approved for NASAL specimens only), is one component of a comprehensive MRSA colonization surveillance program. It is not intended to diagnose MRSA infection nor to  guide or monitor treatment for MRSA infections.     Coagulation Studies: No results for input(s): LABPROT, INR in the last 72 hours.  Urinalysis: No results for input(s): COLORURINE, LABSPEC, PHURINE, GLUCOSEU, HGBUR, BILIRUBINUR, KETONESUR, PROTEINUR, UROBILINOGEN, NITRITE, LEUKOCYTESUR in the last 72 hours.  Invalid input(s): APPERANCEUR    Imaging: Dg Chest 1 View  11/17/2014   CLINICAL DATA:  69 year old female with a history of dyspnea.  EXAM: CHEST  1 VIEW  COMPARISON:  11/14/2014, chest CT 11/12/2014, chest x-ray 11/12/2014  FINDINGS: Cardiomediastinal silhouette unchanged.  Low lung volumes.  Improving interstitial and airspace opacities in the hilar regions and lower lungs. Retrocardiac region not well evaluated. No large pleural effusion. No pneumothorax.  Unchanged position of left IJ approach central venous catheter.  Atherosclerotic calcifications of the aortic arch.  No displaced fracture.  IMPRESSION:  Improving bilateral interstitial and airspace disease.  Unchanged central venous catheter.  Atherosclerosis.  Signed,  Yvone Neu. Loreta Ave, DO  [facility]  [facility]   Electronically Signed   By: Gilmer Mor D.O.   On: 11/17/2014 08:13     Medications:     . ALPRAZolam  1 mg Oral TID  . budesonide (PULMICORT) nebulizer solution  0.25 mg Nebulization BID  . calcitRIOL  0.25 mcg Oral Daily  . cinacalcet  60 mg Oral Q breakfast  . citalopram  20 mg Oral Daily  . docusate sodium  100 mg Oral BID  . donepezil  5 mg Oral QHS  . feeding supplement (NEPRO CARB STEADY)  237 mL Oral BID BM  . ipratropium-albuterol  3 mL Nebulization Q6H  . methylPREDNISolone (SOLU-MEDROL) injection  40 mg Intravenous Daily  . multivitamin with minerals  1 tablet Oral Daily  . pantoprazole (PROTONIX) IV  40 mg Intravenous Q12H  . piperacillin-tazobactam (ZOSYN)  IV  3.375 g Intravenous Q12H  . pravastatin  20 mg Oral q1800  . sevelamer carbonate  2,400 mg Oral TID WC  . traZODone  25-50 mg Oral QHS  . vancomycin  750 mg Intravenous Q T,Th,Sa-HD   sodium chloride, sodium chloride, albuterol, benzonatate, chlorpheniramine-HYDROcodone, hydrOXYzine, lidocaine (PF), lidocaine-prilocaine, menthol-cetylpyridinium, pentafluoroprop-tetrafluoroeth, sevelamer carbonate, traMADol, traMADol, zolpidem  Assessment/ Plan:  69 y.o. female with past medical history of ESRD on HD TTHS, SHPTH, AOCD, HTN, hyperlipidemia, COPD, DVT with PE, tx with long term coumadin and IVC filter placement, L knee TKA, R shoulder surgery  [facility] [address]  1. End-stage renal disease on hemodialysis: three days of hemodialysis. UF of 12.5 litres total. UF of goal 3.5 litre today.  Monitor daily for dialysis need.   2. Anemia of chronic kidney disease/anemia of blood loss. Hemoglobin 7.4. The patient was having black tarry stools at home. Gastritis on EGD - status post transfusion PRBC on  7/22 - epo with HD.   3. Secondary hyperparathyroidism. PTH 142 - discontinue calcitriol and cinacalcet.  - Continue renvela 2400mg  po tid/wm.  4. Acute exacerbation of COPD with Respiratory Distress with acute exacerbation of congestive heart failure: improved clinically - solumedrol, empiric vanc and zosyn.  - nebs - Continue oxygen.  - echocardiogram showing aortic stenosis. Cardiology consulted - if no improvement, will reconsider another CT with contrast.   5. Actue DVT: with history of DVT. Status post IVC placed 7/25 [name].  - was on warfarin in the past.  - currently not on anticoagulation due to active GI bleed. Will need something like Eliquis on discharge. Failed outpatient warfarin. Will wait  for GI to clear for clearance for anticoagulation.    LOS: 8 Mervil Wacker 7/30/20169:20 AM

## 2014-11-17 NOTE — Consult Note (Signed)
Palliative Medicine Inpatient Consult Follow Up Note   Name: Ariel Smith Date: 11/16/2014 MRN: [id number]  DOB: 12/28/1945  Referring Physician: Dustin Flock, MD  Palliative Care consult requested for this 69 y.o. female for goals of medical therapy in patient with Bilateral Pneumonia, COPD, Mild to Moderate AS, Acute on Chronic Resp Failure, GI bleeding, anemia of chronic/ renal/ acute blood loss/ and B12 deficiency, ESRD on HD now having recently failed home PD, fluid overload, DVT  S/P filter --with recent subtherapeutic INRS (Dr. Abigail Butts will start her on Eliquis when cleared by GI/ at discharge as coumadin was not being maintained at a therapeutic level). The issue that prompted a Arnett occurred two days ago when one of her daughters who lives in [place], came to visit and became extremely upset over finding out that the patient had elected to be DNR status (with aggressive care otherwise).  Since then, the patient has confirmed her wishes to remain DNR status.  PLAN:  Today, she is somewhat improved. She is back down to normal flow oxygen via nasal cannula. I met her daughter who lives locally and was able to talk in the room with her about her mother's expressed desire to be DNR.  This daughter says she respects her mother's choice, but she would like for me to speak with her sister.  The sister is not here at this time, however. The patient wants to go home when she recovers, but she may end up being a candidate for rehab if she becomes significantly deconditioned.  She says she is comfortable and has no pain.  Family asked me some questions about bringing infants to visit and this was discouraged.     REVIEW OF SYSTEMS:  No pain.  Less short of breath.   CODE STATUS: DNR   PAST MEDICAL HISTORY: Past Medical History  Diagnosis Date  . Osteoarthritis   . Depression   . Frequent headaches   . HTN (hypertension)   . HLD (hyperlipidemia)   . History of colon polyps  2013    colonoscopy (Dr. Bary Castilla)  . 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 Systolic murmur     mild  . GERD (gastroesophageal reflux disease)   . COPD (chronic obstructive pulmonary disease)   . ESRD (end stage renal disease) 08/2011    TThSa, L forearm AV fistula, Dr. Candiss Norse  . Secondary hyperparathyroidism of renal origin   . Osteopenia 01/2013  . Bronchiectasis 08/2014     suggested by thoracic xray    PAST SURGICAL HISTORY:  Past Surgical History  Procedure Laterality Date  . Cholecystectomy  2012  . Bunionectomy Left 2003  . Replacement total knee Right 2006  . Shoulder arthroscopy Right 2009  . Colonoscopy  08/2011    colon biopsies, Dr. Fleet Contras  . Spirometry  04/2011    WNL  . US echocardiography  06/2008    EF >55%  . Esophagogastroduodenoscopy  08/2011    gastric cardia polyp  . Tonsillectomy  1955  . Tubal ligation  1980  . Exteriorization of a continuous ambulatory peritoneal dialysis catheter  01/2013    removal 12/2103 - Dr. Lucky Cowboy  . Dexa  01/2013    osteopenia with -2.0 at hip and spine  . Hospitalization  12/2013    recurrent R pleural effusion due to peritoneal fluid translocation s/p rpt thoracentesis with 1.3 L fluid  removed, ERSD started on HD this hospitalization  . US echocardiography  12/2013    EF 55-60%, nl LV sys fxn, mild-mod MR, AS, increased LV posterior wall thickness, mild TR  . Esophagogastroduodenoscopy Left 11/11/2014    Procedure: ESOPHAGOGASTRODUODENOSCOPY (EGD);  Surgeon: Hulen Luster, MD;  Location: [facility] ENDOSCOPY;  Service: Endoscopy;  Laterality: Left;  . Peripheral vascular catheterization N/A 11/12/2014    Procedure: IVC Filter Insertion;  Surgeon: Algernon Huxley, MD;  Location: [facility] LAB;  Service: Cardiovascular;  Laterality: N/A;    Vital Signs: BP 139/73 mmHg  Pulse 102  Temp(Src) 98.8 F (37.1 C) (Oral)  Resp 14  Ht 5' 4"  (1.626 m)  Wt  100.6 kg (221 lb 12.5 oz)  BMI 38.05 kg/m2  SpO2 93% Filed Weights   11/15/14 1252 11/16/14 1200 11/16/14 1526  Weight: 102.5 kg (225 lb 15.5 oz) 105 kg (231 lb 7.7 oz) 100.6 kg (221 lb 12.5 oz)    Estimated body mass index is 38.05 kg/(m^2) as calculated from the following:   Height as of this encounter: 5' 4"  (1.626 m).   Weight as of this encounter: 100.6 kg (221 lb 12.5 oz).  PHYSICAL EXAM: NAD  EOMI and OP clear Getting O2 via nasal cannula No JVD or Thyromegaly Heart rrr no mgr Lungs with some ronchi but otw clear anteriorly Abd soft and nontender Moves all 4 ext Skin warm and dry Alert and oriented fully  LABS: CBC:    Component Value Date/Time   WBC 6.4 11/16/2014 0444   WBC 8.0 01/11/2014 1559   HGB 7.3* 11/16/2014 0444   HGB 9.0* 01/11/2014 1559   HGB 9.0 06/02/2013   HCT 22.2* 11/16/2014 0444   HCT 27.0* 01/11/2014 1559   PLT 158 11/16/2014 0444   PLT 149* 01/15/2014 0510   MCV 98.4 11/16/2014 0444   MCV 105* 01/11/2014 1559   MCV 105.9 06/02/2013   NEUTROABS 4.5 03/30/2014 1724   NEUTROABS 6.6* 11/21/2013 0027   LYMPHSABS 1.5 03/30/2014 1724   LYMPHSABS 1.1 11/21/2013 0027   MONOABS 0.8 03/30/2014 1724   MONOABS 0.7 11/21/2013 0027   EOSABS 0.1 03/30/2014 1724   EOSABS 0.2 11/21/2013 0027   BASOSABS 0.0 03/30/2014 1724   BASOSABS 0 01/08/2014 0912   BASOSABS 0.0 11/21/2013 0027   BASOSABS 2 09/04/2011 1607   Comprehensive Metabolic Panel:    Component Value Date/Time   NA 142 11/16/2014 1224   NA 142 01/11/2014 1559   NA 141 08/10/2011   K 4.3 11/16/2014 1224   K 3.7 01/11/2014 1559   K 4.3 08/10/2011   CL 99* 11/16/2014 1224   CL 109* 01/11/2014 1559   CO2 33* 11/16/2014 1224   CO2 24 01/11/2014 1559   BUN 38* 11/16/2014 1224   BUN 42* 01/11/2014 1559   CREATININE 4.03* 11/16/2014 1224   CREATININE 6.17* 03/30/2014 1724   CREATININE 6.27* 01/11/2014 1559   GLUCOSE 115* 11/16/2014 1224   GLUCOSE 106* 01/11/2014 1559   CALCIUM 8.3*  11/16/2014 1224   CALCIUM 8.5 01/11/2014 1559   CALCIUM 8.4 06/02/2013   AST 25 11/09/2014 1132   AST 21 01/11/2014 1559   ALT 18 11/09/2014 1132   ALT 15 01/11/2014 1559   ALKPHOS 58 11/09/2014 1132   ALKPHOS 70 01/11/2014 1559   ALKPHOS 67 06/02/2013   BILITOT 0.4 11/09/2014 1132   BILITOT 0.2 01/11/2014 1559   PROT 5.9* 11/09/2014 1132   PROT 6.9 01/11/2014 1559   ALBUMIN 2.8* 11/16/2014 1224  ALBUMIN 3.3* 01/11/2014 1559    IMPRESSION: 1) Acute on Chronic Respiratory Failure with Hypoxia 2) Bilateral Pneumonia 3) COPD --with acute exacerbation 4) Acute blood loss anemia and anemia of chronic disease --on EPO 5) Hypovolemic shock 6) ESRD on HD with secondary hypoparathyroidsim 7) Fluid overload -diuresed with HD 8) Left leg acute DVT s/p IVC filter and not anticoagulated due to GI bleeding --with subtherapeutic INRS as outpt on coumadin recently 9) Gastritis and GI bleeding (black tarry stools at home) 10) Dyslipidemia 11)Pernicious Anemia --she gets B12 shots at home 12) Vitamin D deficiency 13) Hyperglycemia w/o DM2 evident (HbA1C + 5.4) 14) Poor nutritional status despite obesity 15) Mild thrombocytopenia 16) Moderate to Severe Aortic Stenosis seen on echo done this admission --for outpt follow up.    See Above for PLAN   REFERRALS TO BE ORDERED:  Case Management.  She may need Home health if she goes home instead of to rehab facility.   More than 50% of the visit was spent in counseling/coordination of care: YES  Time Spent: 30 minutes in supportive discussion

## 2014-11-17 NOTE — Progress Notes (Signed)
[facility]  Critical Care Medicine Progess Note    ASSESSMENT / PLAN: DVT (deep venous thrombosis) -Status post IVC filter placement. It is possible that the patient suffered a pulmonary embolism before the IVC filter was placed which may be contributing to her acute respiratory failure. --Failed outpatient warfarin. Will wait for GI to clear for clearance for anticoagulation.   Acute respiratory failure with hypoxemia -This is likely multifactorial due to pneumonia COPD and pulmonary edema due to volume overload. Will wean down high flow oxygen as tolerated.  HAP (hospital-acquired pneumonia) -Continue antibiotics.  COPD exacerbation -Doing better. We will wean steroids. Further  UGI bleed -GI following will continue to monitor hemoglobins appears to be stable at this time   ESRD on dialysis -Discussed with nephrology the patient continues to be on dialysis and appears to be making very good progress  Aortic valvar stenosis -Echocardiogram showed moderate to severe aortic stenosis likely contributing to her pulmonary edema and respiratory failure. -Cardiology following.  Acute respiratory distress -Currently on nasal cannula weaned from high flow oxygen. She looks much better today.     ----------------------------------------   Name: Ariel Smith MRN: [id number] DOB: 11-25-1945    ADMISSION DATE:  11/09/2014  SUBJECTIVE:   Patient feels that her breathing is doing slightly better than yesterday discussed case with  family members were present in the room time. Case was discussed with RN and with family at bedside. The patient has no new complaints today.   Review of Systems:  Constitutional: Feels well. Cardiovascular: No chest pain.  Pulmonary: Denies dyspnea.   The remainder of systems were reviewed and were found to be negative other than what is documented in the HPI.    VITAL SIGNS: Temp:  [97.6 F (36.4 C)-98.8 F (37.1 C)] 98.3 F (36.8 C)  (07/30 0738) Pulse Rate:  [89-115] 93 (07/30 1115) Resp:  [14-27] 22 (07/30 1115) BP: (95-148)/(50-87) 114/64 mmHg (07/30 1115) SpO2:  [82 %-100 %] 100 % (07/30 1000) FiO2 (%):  [4 %-45 %] 4 % (07/30 0600) Weight:  [100.6 kg (221 lb 12.5 oz)-105 kg (231 lb 7.7 oz)] 103.8 kg (228 lb 13.4 oz) (07/30 1010) HEMODYNAMICS:   VENTILATOR SETTINGS: Vent Mode:  [-]  FiO2 (%):  [4 %-45 %] 4 % INTAKE / OUTPUT:  Intake/Output Summary (Last 24 hours) at 11/17/14 1119 Last data filed at 11/17/14 0900  Gross per 24 hour  Intake    750 ml  Output   3500 ml  Net  -2750 ml    PHYSICAL EXAMINATION: Physical Examination:   VS: BP 114/64 mmHg  Pulse 93  Temp(Src) 98.3 F (36.8 C) (Oral)  Resp 22  Ht 5\' 4"  (1.626 m)  Wt 103.8 kg (228 lb 13.4 oz)  BMI 39.26 kg/m2  SpO2 100%  General Appearance: No distress  Neuro:without focal findings, mental status normal. HEENT: PERRLA, EOM intact. Pulmonary: normal breath sounds   CardiovascularNormal S1,S2.  No m/r/g.   Abdomen: Benign, Soft, non-tender. Renal:  No costovertebral tenderness  GU:  Not performed at this time. Endocrine: No evident thyromegaly. Skin:   warm, no rashes, no ecchymosis  Extremities: normal, no cyanosis, clubbing.   LABS:  CBC  Recent Labs Lab 11/11/14 0641  11/15/14 0438 11/16/14 0444 11/17/14 0540  WBC 6.0  --   --  6.4 6.9  HGB 8.1*  < > 7.6* 7.3* 7.4*  HCT 23.9*  --   --  22.2* 22.3*  PLT 132*  --   --  158  183  < > = values in this interval not displayed. Coag's  Recent Labs Lab 11/10/14 2242 11/11/14 0641 11/12/14 0633  INR 1.09 1.07 1.10   BMET  Recent Labs Lab 11/13/14 0736 11/16/14 1224 11/17/14 0540  NA 136 142 141  K 4.6 4.3 4.0  CL 98* 99* 98*  CO2 26 33* 33*  BUN 50* 38* 30*  CREATININE 6.57* 4.03* 3.40*  GLUCOSE 172* 115* 97   Electrolytes  Recent Labs Lab 11/12/14 0640 11/13/14 0736 11/16/14 1224 11/17/14 0540  CALCIUM 8.0* 8.0* 8.3* 8.1*  PHOS 4.0 4.9* 1.8*  --     Sepsis Markers No results for input(s): LATICACIDVEN, PROCALCITON, O2SATVEN in the last 168 hours. ABG  Recent Labs Lab 11/17/14 0447  PHART 7.51*  PCO2ART 47  PO2ART 53*   Liver Enzymes  Recent Labs Lab 11/12/14 0640 11/13/14 0736 11/16/14 1224  ALBUMIN 2.8* 2.9* 2.8*   Cardiac Enzymes No results for input(s): TROPONINI, PROBNP in the last 168 hours. Glucose No results for input(s): GLUCAP in the last 168 hours.  Imaging Dg Chest 1 View  11/17/2014   CLINICAL DATA:  69 year old female with a history of dyspnea.  EXAM: CHEST  1 VIEW  COMPARISON:  11/14/2014, chest CT 11/12/2014, chest x-ray 11/12/2014  FINDINGS: Cardiomediastinal silhouette unchanged.  Low lung volumes.  Improving interstitial and airspace opacities in the hilar regions and lower lungs. Retrocardiac region not well evaluated. No large pleural effusion. No pneumothorax.  Unchanged position of left IJ approach central venous catheter.  Atherosclerotic calcifications of the aortic arch.  No displaced fracture.  IMPRESSION: Improving bilateral interstitial and airspace disease.  Unchanged central venous catheter.  Atherosclerosis.  Signed,  Yvone Neu. Loreta Ave, DO  [facility]  [facility]   Electronically Signed   By: Gilmer Mor D.O.   On: 11/17/2014 08:13     --Wells Guiles, MD.  Pager [phone] [place] [facility] Office Number: [phone]  Santiago Glad, M.D.  Stephanie Acre, M.D.  Carolyne Fiscal, M.D

## 2014-11-17 NOTE — Consult Note (Addendum)
Cardiology Consultation Note  Patient ID: Ariel Smith, MRN: [id number], DOB/AGE: [birth year] 69 y.o. Admit date: 11/09/2014   Date of Consult: 11/17/2014 Primary Physician: [name], MD Primary Cardiologist: New to [facility]  Chief Complaint: Melena Reason for Consult: Aortic stenosis and acute diastolic CHF  HPI: 69 y.o. 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 Cardiology is consulted for further evaluation post echo done on 7/28 that showed elevated right sided pressures and moderate to severe aortic stenosis.   She has been feeling quite weak and fatigued in the days leading up to her admission with associated black tarry stools. She had noted some increased dyspnea, however no chest pain. No presyncope, or syncope. She has known history of DVT with concomitant PE which she takes Coumadin for. She has previously had a colonoscopy years ago which showed a single polyp. Because of her significant fatigue she presented to [facility] for further evaluation.    Upon her arrival she was found to be hypotensive and hypoxic with melena. HGB of 6.4-->5.7 s/p transfusion of 2 units of pRBC. HGB 7.4 at time of consult. Her Coumadin was held at the time of admission. CXR showed bilateral opacities and she was started on vancomycin and Zosyn for possible PNA vs PE. LE dopplers showed acute on chronic DVT. She underwent successful placement of IVC filter on 7/25. She was seen by GI who saw no signs of bleed and felt no intervention was needed. She has continued to be dialyzed.  She remained SOB and hypoxic, at times required BiPAP. Echo was performed because of this and because of murmur which showed EF 60-65%, no WMA, GR1DD,  moderate to severe aortic stenosis, Peak velocity (S): 377 cm/s. Meangradient (S): 34 mm Hg. Peak gradient (S): 57 mm Hg. Valve area (VTI): 0.72 cm^2. Mild MR, left atrium mildly dilated, RV systolic function normal, PASP mildly elevated at 46 mm Hg. Cardiology was consulted for further input on the above echo.        Past Medical History  Diagnosis Date  . Osteoarthritis   . Depression   . Frequent headaches   . HTN (hypertension)   . HLD (hyperlipidemia)   . History of colon polyps 2013    colonoscopy ([name])  . 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 Systolic murmur     mild  . GERD (gastroesophageal reflux disease)   . COPD (chronic obstructive pulmonary disease)   . ESRD (end stage renal disease) 08/2011    TThSa, L forearm AV fistula, [name]  . Secondary hyperparathyroidism of renal origin   . Osteopenia 01/2013  . Bronchiectasis 08/2014     suggested by thoracic xray      Most Recent Cardiac Studies: Echo 05/2013:   EF 55-60%, mild to moderate mitral valve regurgitation, mild to moderate aortic valve stenosis, moderately increased left ventricular posterior wall thickness, mild tricuspid regurgitation.    Echo 11/15/2014:  EF 60-65%, no regional wall motion abnormalities, GR1DD, moderate to severe aortic stenosis, Peak velocity (S): 377 cm/s. Meangradient (S): 34 mm Hg. Peak gradient (S): 57 mm Hg.  Valve area (VTI): 0.72 cm^2. Mild MR, left atrium mildly dilated, RV systolic function normal, PASP mildly elevated at 46 mm Hg.     Surgical History:  Past Surgical History  Procedure Laterality Date  . Cholecystectomy  2012  . Bunionectomy Left 2003  . Replacement total knee Right 2006  . Shoulder arthroscopy Right 2009  . Colonoscopy  08/2011    colon biopsies, [name]  . Spirometry  04/2011    WNL  . US echocardiography  06/2008    EF >55%  . Esophagogastroduodenoscopy   08/2011    gastric cardia polyp  . Tonsillectomy  1955  . Tubal ligation  1980  . Exteriorization of a continuous ambulatory peritoneal dialysis catheter  01/2013    removal 12/2103 - [name]  . Dexa  01/2013    osteopenia with -2.0 at hip and spine  . Hospitalization  12/2013    recurrent R pleural effusion due to peritoneal fluid translocation s/p rpt thoracentesis with 1.3 L fluid removed, ERSD started on HD this hospitalization  . US echocardiography  12/2013    EF 55-60%, nl LV sys fxn, mild-mod MR, AS, increased LV posterior wall thickness, mild TR  . Esophagogastroduodenoscopy Left 11/11/2014    Procedure: ESOPHAGOGASTRODUODENOSCOPY (EGD);  Surgeon: [name], MD;  Location: [facility] ENDOSCOPY;  Service: Endoscopy;  Laterality: Left;  . Peripheral vascular catheterization N/A 11/12/2014    Procedure: IVC Filter Insertion;  Surgeon: [name], MD;  Location: [facility] INVASIVE CV LAB;  Service: Cardiovascular;  Laterality: N/A;     Home Meds: Prior to Admission medications   Medication Sig Start Date End Date Taking? Authorizing Provider  acetaminophen (TYLENOL) 500 MG tablet Take 1,000 mg by mouth every 6 (six) hours as needed for mild pain or headache.    Yes Historical Provider, MD  albuterol (VENTOLIN HFA) 108 (90 BASE) MCG/ACT inhaler Inhale 2 puffs into the lungs every 6 (six) hours as needed for wheezing or shortness of breath.    Yes Historical Provider, MD  baclofen (LIORESAL) 10 MG tablet Take 5-10 mg by mouth 2 (two) times daily as needed for muscle spasms.   Yes Historical Provider, MD  budesonide-formoterol (SYMBICORT) 160-4.5 MCG/ACT inhaler Inhale 2 puffs into the lungs 2 (two) times daily.   Yes Historical Provider, MD  calcitRIOL (ROCALTROL) 0.25 MCG capsule Take 0.25 mcg by mouth daily.   Yes Historical Provider, MD  calcium acetate (PHOSLO) 667 MG capsule Take 2,001 mg by mouth 3 (three) times daily with meals.    Yes Historical Provider, MD  cinacalcet (SENSIPAR) 60 MG tablet  Take 60 mg by mouth daily.   Yes Historical Provider, MD  citalopram (CELEXA) 20 MG tablet Take 1 tablet (20 mg total) by mouth daily. 03/30/14  Yes [name], MD  donepezil (ARICEPT) 5 MG tablet Take 1 tablet (5 mg total) by mouth at bedtime. Patient taking differently: Take 2.5 mg by mouth at bedtime.  11/01/14  Yes [name], MD  hydrOXYzine (ATARAX/VISTARIL) 25 MG tablet Take 12.5-25 mg by mouth 2 (two) times daily as needed for anxiety.   Yes Historical Provider, MD  lovastatin (MEVACOR) 20 MG tablet Take 20 mg by mouth at bedtime.   Yes Historical Provider, MD  pantoprazole (PROTONIX) 40 MG tablet Take 40 mg by mouth daily as needed (for heartburn/indigestion).   Yes Historical Provider, MD  promethazine (PHENERGAN) 12.5 MG tablet Take 12.5 mg by mouth every 6 (six) hours as needed for nausea or vomiting.  Yes Historical Provider, MD  traMADol (ULTRAM) 50 MG tablet Take 25 mg by mouth every 12 (twelve) hours as needed for moderate pain.   Yes Historical Provider, MD  traZODone (DESYREL) 50 MG tablet Take 0.5-1 tablets (25-50 mg total) by mouth at bedtime. Patient taking differently: Take 25 mg by mouth at bedtime.  11/01/14  Yes [name], MD  warfarin (COUMADIN) 2.5 MG tablet Take 2.5 mg by mouth at bedtime.    Yes Historical Provider, MD  cyanocobalamin (,VITAMIN B-12,) 1000 MCG/ML injection Inject 1,000 mcg into the muscle every 30 (thirty) days. Start 10/2014    [name], MD    Inpatient Medications:  . ALPRAZolam  1 mg Oral TID  . budesonide (PULMICORT) nebulizer solution  0.25 mg Nebulization BID  . citalopram  20 mg Oral Daily  . docusate sodium  100 mg Oral BID  . donepezil  5 mg Oral QHS  . epoetin (EPOGEN/PROCRIT) injection  10,000 Units Intravenous Once  . feeding supplement (NEPRO CARB STEADY)  237 mL Oral BID BM  . ipratropium-albuterol  3 mL Nebulization Q6H  . methylPREDNISolone (SOLU-MEDROL) injection  40 mg Intravenous Daily  . multivitamin  with minerals  1 tablet Oral Daily  . pantoprazole (PROTONIX) IV  40 mg Intravenous Q12H  . piperacillin-tazobactam (ZOSYN)  IV  3.375 g Intravenous Q12H  . pravastatin  20 mg Oral q1800  . sevelamer carbonate  2,400 mg Oral TID WC  . traZODone  25-50 mg Oral QHS  . vancomycin  750 mg Intravenous Q T,Th,Sa-HD      Allergies:  Allergies  Allergen Reactions  . Other Other (See Comments)    Pt states that she is allergic to Midrin and it causes headaches.     History   Social History  . Marital Status: Married    Spouse Name: N/A  . Number of Children: N/A  . Years of Education: N/A   Occupational History  . Not on file.   Social History Main Topics  . Smoking status: Former Smoker -- 2.00 packs/day for 30 years    Start date: 04/20/1978    Quit date: 04/20/2009  . Smokeless tobacco: Never Used  . Alcohol Use: No  . Drug Use: No  . Sexual Activity: Not on file   Other Topics Concern  . Not on file   Social History Narrative     Family History  Problem Relation Age of Onset  . Deep vein thrombosis Brother   . Cancer Mother 34    colon  . Cancer Father     prostate  . Hypertension Father   . Hypertension Brother   . Stroke Mother   . Diabetes Neg Hx   . CAD Neg Hx   . Cancer Daughter 39    breast  . Dementia Father 13     Review of Systems: Review of Systems  Constitutional: Positive for weight loss and malaise/fatigue. Negative for fever, chills and diaphoresis.  HENT: Negative for congestion.   Eyes: Negative for discharge and redness.  Respiratory: Positive for shortness of breath and wheezing. Negative for cough, hemoptysis and sputum production.   Cardiovascular: Negative for chest pain, palpitations, orthopnea, claudication, leg swelling and PND.  Gastrointestinal: Positive for nausea and melena. Negative for heartburn, vomiting, abdominal pain, diarrhea, constipation and blood in stool.  Musculoskeletal: Negative for myalgias and falls.  Skin:  Negative for rash.  Neurological: Positive for dizziness and weakness. Negative for tingling, sensory change, speech change and focal weakness.  Endo/Heme/Allergies: Does not bruise/bleed easily.  Psychiatric/Behavioral: The patient is not nervous/anxious.   All other systems reviewed and are negative.   Labs: No results for input(s): CKTOTAL, CKMB, TROPONINI in the last 72 hours. Lab Results  Component Value Date   WBC 6.9 11/17/2014   HGB 7.4* 11/17/2014   HCT 22.3* 11/17/2014   MCV 98.2 11/17/2014   PLT 183 11/17/2014    Recent Labs Lab 11/17/14 0540  NA 141  K 4.0  CL 98*  CO2 33*  BUN 30*  CREATININE 3.40*  CALCIUM 8.1*  GLUCOSE 97   Lab Results  Component Value Date   CHOL 155 03/30/2014   HDL 33* 03/30/2014   LDLCALC 67 03/30/2014   TRIG 275* 03/30/2014   No results found for: DDIMER  Radiology/Studies:  Dg Chest 1 View  11/17/2014   CLINICAL DATA:  69 year old female with a history of dyspnea.  EXAM: CHEST  1 VIEW  COMPARISON:  11/14/2014, chest CT 11/12/2014, chest x-ray 11/12/2014  FINDINGS: Cardiomediastinal silhouette unchanged.  Low lung volumes.  Improving interstitial and airspace opacities in the hilar regions and lower lungs. Retrocardiac region not well evaluated. No large pleural effusion. No pneumothorax.  Unchanged position of left IJ approach central venous catheter.  Atherosclerotic calcifications of the aortic arch.  No displaced fracture.  IMPRESSION: Improving bilateral interstitial and airspace disease.  Unchanged central venous catheter.  Atherosclerosis.  Signed,  Yvone Neu. Loreta Ave, DO  [facility]  [facility]   Electronically Signed   By: Gilmer Mor D.O.   On: 11/17/2014 08:13   Dg Chest 1 View  11/14/2014   CLINICAL DATA:  Central line placement  EXAM: CHEST  1 VIEW  COMPARISON:  11/12/2014  FINDINGS: Left internal jugular central venous catheter place with its tip in the mid SVC and no left  pneumothorax. Heart remains borderline enlarged. Bilateral airspace disease with a central and basilar distribution has worsened.  IMPRESSION: Left internal jugular central venous catheter place with its tip in the mid SVC and no pneumothorax.  Worsening bilateral airspace disease.   Electronically Signed   By: Jolaine Click M.D.   On: 11/14/2014 11:18   Ct Chest Wo Contrast  11/12/2014   CLINICAL DATA:  Exertional dyspnea.  EXAM: CT CHEST WITHOUT CONTRAST  TECHNIQUE: Multidetector CT imaging of the chest was performed following the standard protocol without IV contrast.  COMPARISON:  CT scan of August 15, 2013.  FINDINGS: No pneumothorax is noted. Mild bilateral pleural effusions are noted. Airspace opacity is noted in the upper and lower lobes bilaterally consistent with pneumonia or possibly edema. Atherosclerosis of thoracic aorta is noted without aneurysm formation. No significant mediastinal mass or adenopathy is noted. No significant osseous abnormality is noted. Visualized portion of upper abdomen demonstrates severe bilateral renal atrophy. Stable exophytic calcified abnormality is seen arising from right hepatic lobe consistent with benign abnormality.  IMPRESSION: Bilateral upper lobe and lower lobe opacities are noted consistent with pneumonia or edema. Mild bilateral pleural effusions are noted.   Electronically Signed   By: Lupita Raider, M.D.   On: 11/12/2014 12:23   [place] Extrem Low Bilat Comp  11/10/2014   ADDENDUM REPORT: 11/10/2014 19:00  ADDENDUM: Critical Value/emergent results were called by telephone at the time of interpretation on 11/10/2014 at 7:00 pm to Jenel Lucks, RN, who verbally acknowledged these results.   Electronically Signed   By: Bretta Bang III M.D.   On: 11/10/2014 19:00   11/10/2014  CLINICAL DATA:  Lower extremity edema and pain for 1 week  EXAM: BILATERAL LOWER EXTREMITY VENOUS DUPLEX ULTRASOUND  TECHNIQUE: Gray-scale sonography with graded compression, as well  as color Doppler and duplex ultrasound were performed to evaluate the lower extremity deep venous systems from the level of the common femoral vein and including the common femoral, femoral, profunda femoral, popliteal and calf veins including the posterior tibial, peroneal and gastrocnemius veins when visible. The superficial great saphenous vein was also interrogated. Spectral Doppler was utilized to evaluate flow at rest and with distal augmentation maneuvers in the common femoral, femoral and popliteal veins.  COMPARISON:  Sep 15, 2011  FINDINGS: RIGHT LOWER EXTREMITY  Common Femoral Vein: No evidence of thrombus. Normal compressibility, respiratory phasicity and response to augmentation.  Saphenofemoral Junction: No evidence of thrombus. Normal compressibility and flow on color Doppler imaging.  Profunda Femoral Vein: No evidence of thrombus. Normal compressibility and flow on color Doppler imaging.  Femoral Vein: No evidence of thrombus. Normal compressibility, respiratory phasicity and response to augmentation.  Popliteal Vein: No evidence of thrombus. Normal compressibility, respiratory phasicity and response to augmentation.  Calf Veins: No evidence of thrombus. Normal compressibility and flow on color Doppler imaging. Note that the peroneal vein is not well visualized due to soft tissue edema.  Superficial Great Saphenous Vein: No evidence of thrombus. Normal compressibility and flow on color Doppler imaging.  Venous Reflux:  None.  Other Findings:  None.  LEFT LOWER EXTREMITY  Common Femoral Vein: No evidence of thrombus. Normal compressibility, respiratory phasicity and response to augmentation.  Saphenofemoral Junction: No evidence of thrombus. Normal compressibility and flow on color Doppler imaging.  Profunda Femoral Vein: No evidence of thrombus. Normal compressibility and flow on color Doppler imaging.  Femoral Vein: There is incompletely obstructing thrombus in the proximal left femoral vein. There  is diminished compression and lack of augmentation focally in this area. The remainder of the left common femoral vein is somewhat diminutive without obvious thrombus.  Popliteal Vein: No evidence of thrombus. Normal compressibility, respiratory phasicity and response to augmentation.  Calf Veins: No evidence of thrombus. Normal compressibility and flow on color Doppler imaging. Peroneal vein not well visualized due to soft tissue edema.  Superficial Great Saphenous Vein: No evidence of thrombus. Normal compressibility and flow on color Doppler imaging.  Venous Reflux:  None.  Other Findings:  None.  IMPRESSION: Incompletely obstructing thrombus in the proximal left femoral vein. No other appreciable areas of deep venous thrombosis. Neither peroneal vein well seen due to soft tissue edema.  Electronically Signed: By: Bretta Bang III M.D. On: 11/10/2014 18:43   Dg Chest Port 1 View  11/12/2014   CLINICAL DATA:  Shortness of Breath  EXAM: PORTABLE CHEST - 1 VIEW  COMPARISON:  01/12/2014  FINDINGS: Cardiac shadow is within normal limits. Patchy infiltrate is noted in the medial left apex as well as in the right perihilar and infrahilar region. No sizable effusion is seen. No bony abnormality is noted.  IMPRESSION: Patchy infiltrates bilaterally. Followup PA and lateral chest X-ray is recommended in 3-4 weeks following trial of antibiotic therapy to ensure resolution and exclude underlying malignancy.   Electronically Signed   By: Alcide Clever M.D.   On: 11/12/2014 07:47    EKG: NSR, 87 bpm, nonspecific anterior st/t changes   Weights: Filed Weights   11/15/14 1252 11/16/14 1200 11/16/14 1526  Weight: 225 lb 15.5 oz (102.5 kg) 231 lb 7.7 oz (105 kg) 221 lb 12.5 oz (  100.6 kg)     Physical Exam: Blood pressure 119/62, pulse 96, temperature 98.3 F (36.8 C), temperature source Oral, resp. rate 18, height 5\' 4"  (1.626 m), weight 221 lb 12.5 oz (100.6 kg), SpO2 100 %. Body mass index is 38.05  kg/(m^2). General: Well developed, well nourished, in no acute distress. Head: Normocephalic, atraumatic, sclera non-icteric, no xanthomas, nares are without discharge.  Neck: Negative for carotid bruits. JVD not elevated. Lungs: Clear bilaterally to auscultation without wheezes, rales, or rhonchi. Breathing is unlabored. Heart: RRR with S1 S2. III/VI systolic murmur RUSB and apex. No rubs, or gallops appreciated. Abdomen: Soft, non-tender, non-distended with normoactive bowel sounds. No hepatomegaly. No rebound/guarding. No obvious abdominal masses. Msk:  Strength and tone appear normal for age. Extremities: No clubbing or cyanosis. No edema.  Distal pedal pulses are 2+ and equal bilaterally. Neuro: Alert and oriented X 3. No facial asymmetry. No focal deficit. Moves all extremities spontaneously. Psych:  Responds to questions appropriately with a normal affect.    Assessment and Plan:  69 y.o. 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 Cardiology is consulted for further evaluation post echo done on 7/28 that showed elevated right sided pressures and moderate to severe aortic stenosis.   1. Acute respiratory distress with hypoxia: -Now on nasal cannula   -Likely multifactorial including PNA, COPD exacerbation, acute diastolic CHF with mildly elevated right sided pressures, possibly PE, and acute on chronic anemia in the setting of melena     2. Acute on chronic diastolic CHF: -Right sided pressures mildly elevated -As pressures allow would pull more fluid off through dialysis  -Limit salt and po fluid intake   3. Moderate to severe aortic stenosis: -Not at a surgical point at this time -Will continue to  monitor medically with echos -Not stable for surgery at this time regardless  -Given her baseline anemia, and acute on chronic anemia with her aortic valve diease she has quite a narrow volume range and is in a tenuous position at this time  4. Acute on chronic anemia: -Transfuse to keep hgb at 8 to 8.5 -GI with no plans for intervention at this time  5. Acute on chronic DVT with history of PE: -Status post IVC filter on 7/25 -Spoke with pharmacy, given patient's underlying ESRD on HD, and underlying anemia she would not be a candidate for NOAC (Eliquis, Pradaxa, or Xarelto) moving forward.  -Would need to continue Coumadin in this setting    Signed, Eula Listen, PA-C Pager: [phone] 11/17/2014, 10:02 AM    Attending Note Patient seen and examined, agree with detailed note above,  Patient presentation and plan discussed on rounds.   Profound anemia on arrival, new DVT, status post IVC filter,  Improved respiratory distress with dialysis, Shortness of breath likely multifactorial including COPD, anemia, deconditioning, acute on chronic diastolic CHF  Echocardiogram showing mildly elevated right ventricular systolic pressures Also with moderate to severe aortic valve stenosis Prominent murmur on exam consistent with a AS. Gradient and other parameters reviewed for her aortic valve stenosis. As this is not severe or critical, this can be managed medically at this time.  --We'll recommend echocardiogram on an annual basis, close follow-up in clinic In  the next few years, suspect she will require aortic valve surgery. She may be a candidate for TAVR given her other comorbid conditions.  --Long time spent talking about her condition with the family  we will arrange outpt follow up in clinic as an outpatient  Suspect she will need long-term rehabilitation given her profound weakness. Family reports she is very weak on arrival to the hospital and had been weak at home prior to  this admission high fall risk  --Not a candidate for eliquis or other NOACs for DVTs given her end-stage renal failure/on dialysis. Per pharmacy, not indicated Would restart Coumadin when cleared by GI    Signed: Dossie Arbour  M.D., Ph.D. [facility]

## 2014-11-18 ENCOUNTER — Inpatient Hospital Stay: Payer: Medicare Other

## 2014-11-18 LAB — CBC
HCT: 23.6 % — ABNORMAL LOW (ref 35.0–47.0)
Hemoglobin: 7.8 g/dL — ABNORMAL LOW (ref 12.0–16.0)
MCH: 32.5 pg (ref 26.0–34.0)
MCHC: 33.2 g/dL (ref 32.0–36.0)
MCV: 98 fL (ref 80.0–100.0)
Platelets: 207 10*3/uL (ref 150–440)
RBC: 2.41 MIL/uL — AB (ref 3.80–5.20)
RDW: 16.1 % — AB (ref 11.5–14.5)
WBC: 8.7 10*3/uL (ref 3.6–11.0)

## 2014-11-18 LAB — BASIC METABOLIC PANEL
ANION GAP: 10 (ref 5–15)
BUN: 30 mg/dL — ABNORMAL HIGH (ref 6–20)
CO2: 34 mmol/L — ABNORMAL HIGH (ref 22–32)
Calcium: 8.1 mg/dL — ABNORMAL LOW (ref 8.9–10.3)
Chloride: 94 mmol/L — ABNORMAL LOW (ref 101–111)
Creatinine, Ser: 3.77 mg/dL — ABNORMAL HIGH (ref 0.44–1.00)
GFR, EST AFRICAN AMERICAN: 13 mL/min — AB (ref >60)
GFR, EST NON AFRICAN AMERICAN: 11 mL/min — AB (ref >60)
Glucose, Bld: 99 mg/dL (ref 65–99)
POTASSIUM: 4 mmol/L (ref 3.5–5.1)
SODIUM: 138 mmol/L (ref 135–145)

## 2014-11-18 LAB — PARATHYROID HORMONE, INTACT (NO CA): PTH: 39 pg/mL (ref 15–65)

## 2014-11-18 MED ORDER — SEVELAMER CARBONATE 800 MG PO TABS
1600.0000 mg | ORAL_TABLET | Freq: Three times a day (TID) | ORAL | Status: DC
  Administered 2014-11-18 – 2014-11-22 (×12): 1600 mg via ORAL
  Filled 2014-11-18 (×11): qty 2

## 2014-11-18 MED ORDER — IPRATROPIUM-ALBUTEROL 0.5-2.5 (3) MG/3ML IN SOLN
3.0000 mL | Freq: Three times a day (TID) | RESPIRATORY_TRACT | Status: DC
  Administered 2014-11-19 – 2014-11-22 (×10): 3 mL via RESPIRATORY_TRACT
  Filled 2014-11-18 (×10): qty 3

## 2014-11-18 NOTE — Progress Notes (Addendum)
Patient ID: Ariel Smith, female   DOB: 1946/04/17, 69 y.o.   MRN: [id number]   [facility] PROGRESS NOTE  PCP: [name], MD  HPI/Subjective: Patient currently getting dialyzed. Hemoglobins around 7.4 and stable  Objective: Filed Vitals:   11/18/14 0900  BP: 115/68  Pulse: 91  Temp:   Resp: 23    Filed Weights   11/16/14 1200 11/16/14 1526 11/17/14 1010  Weight: 105 kg (231 lb 7.7 oz) 100.6 kg (221 lb 12.5 oz) 103.8 kg (228 lb 13.4 oz)    ROS: Review of Systems  Constitutional: Negative for fever and chills.  Eyes: Negative for blurred vision.  Respiratory: Positive for cough and shortness of breath. Negative for sputum production.   Cardiovascular: Negative for chest pain.  Gastrointestinal: Negative for nausea, vomiting, abdominal pain, diarrhea and constipation.  Genitourinary: Negative for dysuria.  Musculoskeletal: Positive for neck pain. Negative for joint pain.  Neurological: Negative for dizziness and headaches.   Exam: Physical Exam  Constitutional: She is oriented to person, place, and time.  HENT:  Nose: No mucosal edema.  Mouth/Throat: No oropharyngeal exudate or posterior oropharyngeal edema.  Eyes: Conjunctivae, EOM and lids are normal. Pupils are equal, round, and reactive to light.  Neck: No JVD present. Carotid bruit is not present. No edema present. No thyroid mass and no thyromegaly present.  Cardiovascular: Regular rhythm, S1 normal and S2 normal.  Exam reveals no gallop.   Murmur heard.  Systolic murmur is present with a grade of 3/6  Pulses:      Dorsalis pedis pulses are 2+ on the right side, and 2+ on the left side.  Respiratory: No respiratory distress. She has decreased breath sounds in the right lower field and the left lower field. She has no wheezes. She has rhonchi in the right lower field and the left lower field. She has no rales.  GI: Soft. Bowel sounds are normal. There is no tenderness.  Musculoskeletal:    Right ankle: She exhibits swelling.       Left ankle: She exhibits swelling.  Lymphadenopathy:    She has no cervical adenopathy.  Neurological: She is alert and oriented to person, place, and time. No cranial nerve deficit.  Skin: Skin is warm. No rash noted. Nails show no clubbing.  Psychiatric: She has a normal mood and affect.    Data Reviewed: Basic Metabolic Panel:  Recent Labs Lab 11/12/14 0640 11/13/14 0736 11/16/14 1224 11/17/14 0540 11/18/14 0529  NA 141 136 142 141 138  K 4.5 4.6 4.3 4.0 4.0  CL 101 98* 99* 98* 94*  CO2 30 26 33* 33* 34*  GLUCOSE 98 172* 115* 97 99  BUN 27* 50* 38* 30* 30*  CREATININE 4.55* 6.57* 4.03* 3.40* 3.77*  CALCIUM 8.0* 8.0* 8.3* 8.1* 8.1*  PHOS 4.0 4.9* 1.8*  --   --    Liver Function Tests:  Recent Labs Lab 11/12/14 0640 11/13/14 0736 11/16/14 1224  ALBUMIN 2.8* 2.9* 2.8*   CBC:  Recent Labs Lab 11/14/14 0505 11/15/14 0438 11/16/14 0444 11/17/14 0540 11/18/14 0529  WBC  --   --  6.4 6.9 8.7  HGB 7.3* 7.6* 7.3* 7.4* 7.8*  HCT  --   --  22.2* 22.3* 23.6*  MCV  --   --  98.4 98.2 98.0  PLT  --   --  158 183 207       Studies: Dg Chest 1 View  11/18/2014   CLINICAL DATA:  Short of breath  EXAM: CHEST  1 VIEW  COMPARISON:  Radiograph 11/17/2014  FINDINGS: LEFT central venous line unchanged. Stable cardiac silhouette. There is central venous pulmonary congestion unchanged. Mild basilar atelectasis noted. Mild residual RIGHT basilar infiltrate noted. No overt pulmonary edema.  IMPRESSION: 1. Improvement in the airspace disease seen on CT from 11/12/2014. 2. Mild RIGHT basilar infiltrate remains.   Electronically Signed   By: Genevive Bi M.D.   On: 11/18/2014 09:20   Dg Chest 1 View  11/17/2014   CLINICAL DATA:  69 year old female with a history of dyspnea.  EXAM: CHEST  1 VIEW  COMPARISON:  11/14/2014, chest CT 11/12/2014, chest x-ray 11/12/2014  FINDINGS: Cardiomediastinal silhouette unchanged.  Low lung volumes.   Improving interstitial and airspace opacities in the hilar regions and lower lungs. Retrocardiac region not well evaluated. No large pleural effusion. No pneumothorax.  Unchanged position of left IJ approach central venous catheter.  Atherosclerotic calcifications of the aortic arch.  No displaced fracture.  IMPRESSION: Improving bilateral interstitial and airspace disease.  Unchanged central venous catheter.  Atherosclerosis.  Signed,  Yvone Neu. Loreta Ave, DO  [facility]  [facility]   Electronically Signed   By: Gilmer Mor D.O.   On: 11/17/2014 08:13    Scheduled Meds: . ALPRAZolam  1 mg Oral TID  . budesonide (PULMICORT) nebulizer solution  0.25 mg Nebulization BID  . citalopram  20 mg Oral Daily  . docusate sodium  100 mg Oral BID  . donepezil  5 mg Oral QHS  . feeding supplement (NEPRO CARB STEADY)  237 mL Oral BID BM  . ipratropium-albuterol  3 mL Nebulization Q6H  . methylPREDNISolone (SOLU-MEDROL) injection  20 mg Intravenous Daily  . multivitamin with minerals  1 tablet Oral Daily  . pantoprazole (PROTONIX) IV  40 mg Intravenous Q12H  . piperacillin-tazobactam (ZOSYN)  IV  3.375 g Intravenous Q12H  . pravastatin  20 mg Oral q1800  . sevelamer carbonate  2,400 mg Oral TID WC  . traZODone  25-50 mg Oral QHS  . vancomycin  750 mg Intravenous Q T,Th,Sa-HD    Assessment/Plan:  1. Acute on chronic respiratory failure with hypoxia. Continue oxygen via nasal cannula.  Continue low-dose Solu-Medrol 40 mg daily.  Likely combination of fluid overload, pneumonia and even possibly pulmonary embolism prior to IVC filter placement. 2. Acute pneumonia bilaterally.  Continue vancomycin and Zosyn. Change to po tomm ,cxr improved 3. Fluid overload with IV fluids and blood-  now improved 4.   Acute blood loss anemia- hemoglobin stable at 7.8 5.   Likely upper GI bleed- endoscopy only showed gastritis. Patient is on Protonix. Monitor hemoglobin 6.    Hypovolemic shock- blood pressure on the lower side. Continue to monitor. Appears to be stable  7.   End-stage renal disease on hemodialysis- hemodialysis as per nephrology. 8.   Left lower extremity DVT- s/p IVC filter. 9.   Severe aortic stenosis 10. DO NOT RESUSCITATE  Disposition Plan: To be determined  Consultants:  Gastrointestinal  Nephrology  Vascular surgery  Pulmonary  Procedures:  Endoscopy  IVC filter placement  Time spent: 20 minutes  Joslyne Marshburn, [facility]  [facility]

## 2014-11-18 NOTE — Progress Notes (Signed)
[facility]  ROUNDING NOTE   Subjective:   Family at bedside. Hemodialysis yesterday. UF 3.5 litres Up from bed to chair yesterday 4L [place]  Objective:  Vital signs in last 24 hours:  Temp:  [97.8 F (36.6 C)-98.7 F (37.1 C)] 97.8 F (36.6 C) (07/31 0800) Pulse Rate:  [84-102] 91 (07/31 0900) Resp:  [14-28] 23 (07/31 0900) BP: (90-122)/(45-74) 115/68 mmHg (07/31 0900) SpO2:  [90 %-100 %] 98 % (07/31 0900)  Weight change: -1.2 kg (-2 lb 10.3 oz) Filed Weights   11/16/14 1200 11/16/14 1526 11/17/14 1010  Weight: 105 kg (231 lb 7.7 oz) 100.6 kg (221 lb 12.5 oz) 103.8 kg (228 lb 13.4 oz)    Intake/Output: I/O last 3 completed shifts: In: 1570 [P.O.:1320; IV Piggyback:250] Out: 3501 [Other:3500; Stool:1]   Intake/Output this shift:  Total I/O In: 237 [NG/GT:237] Out: -   Physical Exam: General: Resting comfortably  Head: Normocephalic, atraumatic.  Eyes: Anicteric  Neck: Supple, trachea midline  Lungs:  Diminished, 4 Litres   Heart: Regular, +murmur  Abdomen:  Soft, nontender, BS present  Extremities:  no peripheral edema.  Neurologic: Nonfocal, moving all four extremities  Skin: No lesions  Access: LUE AVF    Basic Metabolic Panel:  Recent Labs Lab 11/12/14 0640 11/13/14 0736 11/16/14 1224 11/17/14 0540 11/18/14 0529  NA 141 136 142 141 138  K 4.5 4.6 4.3 4.0 4.0  CL 101 98* 99* 98* 94*  CO2 30 26 33* 33* 34*  GLUCOSE 98 172* 115* 97 99  BUN 27* 50* 38* 30* 30*  CREATININE 4.55* 6.57* 4.03* 3.40* 3.77*  CALCIUM 8.0* 8.0* 8.3* 8.1* 8.1*  PHOS 4.0 4.9* 1.8*  --   --     Liver Function Tests:  Recent Labs Lab 11/12/14 0640 11/13/14 0736 11/16/14 1224  ALBUMIN 2.8* 2.9* 2.8*   No results for input(s): LIPASE, AMYLASE in the last 168 hours. No results for input(s): AMMONIA in the last 168 hours.  CBC:  Recent Labs Lab 11/14/14 0505 11/15/14 0438 11/16/14 0444 11/17/14 0540 11/18/14 0529  WBC  --   --  6.4 6.9 8.7  HGB  7.3* 7.6* 7.3* 7.4* 7.8*  HCT  --   --  22.2* 22.3* 23.6*  MCV  --   --  98.4 98.2 98.0  PLT  --   --  158 183 207    Cardiac Enzymes: No results for input(s): CKTOTAL, CKMB, CKMBINDEX, TROPONINI in the last 168 hours.  BNP: Invalid input(s): POCBNP  CBG: No results for input(s): GLUCAP in the last 168 hours.  Microbiology: Results for orders placed or performed during the hospital encounter of 11/09/14  MRSA PCR Screening     Status: None   Collection Time: 11/09/14  9:40 PM  Result Value Ref Range Status   MRSA by PCR NEGATIVE NEGATIVE Final    Comment:        The GeneXpert MRSA Assay (FDA approved for NASAL specimens only), is one component of a comprehensive MRSA colonization surveillance program. It is not intended to diagnose MRSA infection nor to guide or monitor treatment for MRSA infections.     Coagulation Studies: No results for input(s): LABPROT, INR in the last 72 hours.  Urinalysis: No results for input(s): COLORURINE, LABSPEC, PHURINE, GLUCOSEU, HGBUR, BILIRUBINUR, KETONESUR, PROTEINUR, UROBILINOGEN, NITRITE, LEUKOCYTESUR in the last 72 hours.  Invalid input(s): APPERANCEUR    Imaging: Dg Chest 1 View  11/18/2014   CLINICAL DATA:  Short of breath  EXAM: CHEST  1 VIEW  COMPARISON:  Radiograph 11/17/2014  FINDINGS: LEFT central venous line unchanged. Stable cardiac silhouette. There is central venous pulmonary congestion unchanged. Mild basilar atelectasis noted. Mild residual RIGHT basilar infiltrate noted. No overt pulmonary edema.  IMPRESSION: 1. Improvement in the airspace disease seen on CT from 11/12/2014. 2. Mild RIGHT basilar infiltrate remains.   Electronically Signed   By: Genevive Bi M.D.   On: 11/18/2014 09:20   Dg Chest 1 View  11/17/2014   CLINICAL DATA:  69 year old female with a history of dyspnea.  EXAM: CHEST  1 VIEW  COMPARISON:  11/14/2014, chest CT 11/12/2014, chest x-ray 11/12/2014  FINDINGS: Cardiomediastinal silhouette  unchanged.  Low lung volumes.  Improving interstitial and airspace opacities in the hilar regions and lower lungs. Retrocardiac region not well evaluated. No large pleural effusion. No pneumothorax.  Unchanged position of left IJ approach central venous catheter.  Atherosclerotic calcifications of the aortic arch.  No displaced fracture.  IMPRESSION: Improving bilateral interstitial and airspace disease.  Unchanged central venous catheter.  Atherosclerosis.  Signed,  Yvone Neu. Loreta Ave, DO  [facility]  [facility]   Electronically Signed   By: Gilmer Mor D.O.   On: 11/17/2014 08:13     Medications:     . ALPRAZolam  1 mg Oral TID  . budesonide (PULMICORT) nebulizer solution  0.25 mg Nebulization BID  . citalopram  20 mg Oral Daily  . docusate sodium  100 mg Oral BID  . donepezil  5 mg Oral QHS  . feeding supplement (NEPRO CARB STEADY)  237 mL Oral BID BM  . ipratropium-albuterol  3 mL Nebulization Q6H  . multivitamin with minerals  1 tablet Oral Daily  . pantoprazole (PROTONIX) IV  40 mg Intravenous Q12H  . piperacillin-tazobactam (ZOSYN)  IV  3.375 g Intravenous Q12H  . pravastatin  20 mg Oral q1800  . sevelamer carbonate  2,400 mg Oral TID WC  . traZODone  25-50 mg Oral QHS  . vancomycin  750 mg Intravenous Q T,Th,Sa-HD   sodium chloride, sodium chloride, albuterol, benzonatate, chlorpheniramine-HYDROcodone, hydrOXYzine, lidocaine (PF), lidocaine-prilocaine, menthol-cetylpyridinium, pentafluoroprop-tetrafluoroeth, sevelamer carbonate, traMADol, traMADol, zolpidem  Assessment/ Plan:  69 y.o. female with past medical history of ESRD on HD TTHS, SHPTH, AOCD, HTN, hyperlipidemia, COPD, DVT with PE, tx with long term coumadin and IVC filter placement, L knee TKA, R shoulder surgery  [facility] TTS [address] [facility]  1. End-stage renal disease on hemodialysis: three days of hemodialysis. UF of 15.5 litres total.  Monitor daily for dialysis need.  Next treatment tentatively for Tuesday.   2. Anemia of chronic kidney disease/anemia of blood loss. Hemoglobin 7.8. Secondary to GI bleed. No recent bleeding - status post transfusion PRBC on 7/22 - epo with HD.   3. Secondary hyperparathyroidism. PTH 142 (goal 150-600). Phos low at 1.8 - discontinued calcitriol and cinacalcet.  - Continue renvela, dose reduced  4. Acute exacerbation of COPD with Respiratory Distress with acute exacerbation of congestive heart failure: improved clinically. Patient is DNR - solumedrol, empiric vanc and zosyn.  - nebs - Continue oxygen.  - echocardiogram showing aortic stenosis. Cardiology consulted. Recommend fluid restriction.   5. Actue DVT: with history of DVT. Status post IVC placed 7/25 [name].  - was on warfarin in the past.  - currently not on anticoagulation due to active GI bleed. Will need something like Eliquis on discharge. Failed outpatient warfarin. Will wait for GI to clear for clearance for anticoagulation.  LOS: 9 Mourad Cwikla 7/31/201611:23 AM

## 2014-11-18 NOTE — Progress Notes (Signed)
ANTIBIOTIC CONSULT NOTE - FOLLOW UP  Pharmacy Consult for Vancomycin/Zosyn Indication: rule out pneumonia  Allergies  Allergen Reactions  . Other Other (See Comments)    Pt states that she is allergic to Midrin and it causes headaches.     Patient Measurements: Height: 5\' 4"  (162.6 cm) Weight: 228 lb 13.4 oz (103.8 kg) IBW/kg (Calculated) : 54.7   Vital Signs: Temp: 97.8 F (36.6 C) (07/31 0800) Temp Source: Oral (07/31 0800) BP: 115/68 mmHg (07/31 0900) Pulse Rate: 91 (07/31 0900) Intake/Output from previous day: 07/30 0701 - 07/31 0700 In: 1570 [P.O.:1320; IV Piggyback:250] Out: 3501 [Stool:1] Intake/Output from this shift: Total I/O In: 237 [NG/GT:237] Out: -   Labs:  Recent Labs  11/16/14 0444 11/16/14 1224 11/17/14 0540 11/18/14 0529  WBC 6.4  --  6.9 8.7  HGB 7.3*  --  7.4* 7.8*  PLT 158  --  183 207  CREATININE  --  4.03* 3.40* 3.77*   Estimated Creatinine Clearance: 16.8 mL/min (by C-G formula based on Cr of 3.77).  Recent Labs  11/15/14 1338 11/17/14 1102  VANCOTROUGH  --  14  VANCORANDOM 22  --      Microbiology: Recent Results (from the past 720 hour(s))  MRSA PCR Screening     Status: None   Collection Time: 11/09/14  9:40 PM  Result Value Ref Range Status   MRSA by PCR NEGATIVE NEGATIVE Final    Comment:        The GeneXpert MRSA Assay (FDA approved for NASAL specimens only), is one component of a comprehensive MRSA colonization surveillance program. It is not intended to diagnose MRSA infection nor to guide or monitor treatment for MRSA infections.     Anti-infectives    Start     Dose/Rate Route Frequency Ordered Stop   11/16/14 1200  vancomycin (VANCOCIN) IVPB 750 mg/150 ml premix     750 mg 150 mL/hr over 60 Minutes Intravenous  Once 11/16/14 1113 11/16/14 1512   11/13/14 1200  vancomycin (VANCOCIN) IVPB 750 mg/150 ml premix     750 mg 150 mL/hr over 60 Minutes Intravenous Every T-Th-Sa (Hemodialysis) 11/12/14 1120     11/12/14 1130  vancomycin (VANCOCIN) 1,500 mg in sodium chloride 0.9 % 500 mL IVPB     1,500 mg 250 mL/hr over 120 Minutes Intravenous  Once 11/12/14 1117 11/12/14 1649   11/12/14 1030  piperacillin-tazobactam (ZOSYN) IVPB 3.375 g     3.375 g 12.5 mL/hr over 240 Minutes Intravenous Every 12 hours 11/12/14 1004     11/12/14 1015  vancomycin (VANCOCIN) IVPB 1000 mg/200 mL premix  Status:  Discontinued     1,000 mg 200 mL/hr over 60 Minutes Intravenous  Once 11/12/14 1004 11/12/14 1117      Assessment: 69 y/o F with ESRD on HD admitted for acute blood loss anemia ordered empiric abx for COPD exacerbation, r/o PNA. Patient received extra HD session yesterday.   Goal of Therapy:  Pre-HD vancomycin trough: 15-25  Plan:  Ordered random vancomycin level due to patient receiving extra HD session yesterday and no vancomycin dose. Vancomycin level is at goal. No need for further supplementation at this time.   7/30 Trough = 14.  No change to dose at this time.  Ariel Smith K 11/18/2014,12:07 PM

## 2014-11-18 NOTE — Progress Notes (Signed)
[facility] Progess Note    ASSESSMENT / PLAN: DVT (deep venous thrombosis) -Status post IVC filter placement. It is possible that the patient suffered a pulmonary embolism before the IVC filter was placed which may be contributing to her acute respiratory failure. --Failed outpatient warfarin. Will wait for GI to clear for clearance for anticoagulation.   Acute respiratory failure with hypoxemia -This is likely multifactorial due to pneumonia COPD and pulmonary edema due to volume overload. Will wean down high flow oxygen as tolerated. The patient is now doing much better and has been weaned down to 2 L nasal cannula  HAP (hospital-acquired pneumonia) -Continue antibiotics.  COPD exacerbation -Doing better. We will wean steroids. Further  UGI bleed -GI following will continue to monitor hemoglobins appears to be stable at this time   ESRD on dialysis - continues to be on dialysis and appears to be making very good progress  Aortic valvar stenosis -Echocardiogram showed moderate to severe aortic stenosis likely contributing to her pulmonary edema and respiratory failure. This is now very much improved -Cardiology following.  Acute respiratory distress -Currently on nasal cannula weaned from high flow oxygen. She looks much better today.  Okay to transfer to general medical floor.   ----------------------------------------   Name: Ariel Smith MRN: [id number] DOB: Aug 13, 1945    ADMISSION DATE:  11/09/2014  SUBJECTIVE:   Patient feels that her breathing is doing slightly better than yesterday discussed case with  family members were present in the room time. Case was discussed with RN and with family at bedside. The patient has no new complaints today.   Review of Systems:  Constitutional: Feels well. Cardiovascular: No chest pain.  Pulmonary: Denies dyspnea.   The remainder of systems were reviewed and were found to be negative other than  what is documented in the HPI.    VITAL SIGNS: Temp:  [97.8 F (36.6 C)-98.7 F (37.1 C)] 97.8 F (36.6 C) (07/31 0800) Pulse Rate:  [84-102] 91 (07/31 0900) Resp:  [14-28] 23 (07/31 0900) BP: (90-122)/(45-74) 115/68 mmHg (07/31 0900) SpO2:  [90 %-100 %] 98 % (07/31 0900) HEMODYNAMICS:   VENTILATOR SETTINGS:   INTAKE / OUTPUT:  Intake/Output Summary (Last 24 hours) at 11/18/14 1040 Last data filed at 11/18/14 0903  Gross per 24 hour  Intake   1447 ml  Output   3501 ml  Net  -2054 ml    PHYSICAL EXAMINATION: Physical Examination:   VS: BP 115/68 mmHg  Pulse 91  Temp(Src) 97.8 F (36.6 C) (Oral)  Resp 23  Ht 5\' 4"  (1.626 m)  Wt 103.8 kg (228 lb 13.4 oz)  BMI 39.26 kg/m2  SpO2 98%  General Appearance: No distress  Neuro:without focal findings, mental status normal. HEENT: PERRLA, EOM intact. Pulmonary: normal breath sounds   CardiovascularNormal S1,S2.  No m/r/g.   Abdomen: Benign, Soft, non-tender. Renal:  No costovertebral tenderness  GU:  Not performed at this time. Endocrine: No evident thyromegaly. Skin:   warm, no rashes, no ecchymosis  Extremities: normal, no cyanosis, clubbing.   LABS:  CBC  Recent Labs Lab 11/16/14 0444 11/17/14 0540 11/18/14 0529  WBC 6.4 6.9 8.7  HGB 7.3* 7.4* 7.8*  HCT 22.2* 22.3* 23.6*  PLT 158 183 207   Coag's  Recent Labs Lab 11/12/14 0633  INR 1.10   BMET  Recent Labs Lab 11/16/14 1224 11/17/14 0540 11/18/14 0529  NA 142 141 138  K 4.3 4.0 4.0  CL 99* 98* 94*  CO2  33* 33* 34*  BUN 38* 30* 30*  CREATININE 4.03* 3.40* 3.77*  GLUCOSE 115* 97 99   Electrolytes  Recent Labs Lab 11/12/14 0640 11/13/14 0736 11/16/14 1224 11/17/14 0540 11/18/14 0529  CALCIUM 8.0* 8.0* 8.3* 8.1* 8.1*  PHOS 4.0 4.9* 1.8*  --   --    Sepsis Markers No results for input(s): LATICACIDVEN, PROCALCITON, O2SATVEN in the last 168 hours. ABG  Recent Labs Lab 11/17/14 0447  PHART 7.51*  PCO2ART 47  PO2ART 53*    Liver Enzymes  Recent Labs Lab 11/12/14 0640 11/13/14 0736 11/16/14 1224  ALBUMIN 2.8* 2.9* 2.8*   Cardiac Enzymes No results for input(s): TROPONINI, PROBNP in the last 168 hours. Glucose No results for input(s): GLUCAP in the last 168 hours.  Imaging Dg Chest 1 View  11/18/2014   CLINICAL DATA:  Short of breath  EXAM: CHEST  1 VIEW  COMPARISON:  Radiograph 11/17/2014  FINDINGS: LEFT central venous line unchanged. Stable cardiac silhouette. There is central venous pulmonary congestion unchanged. Mild basilar atelectasis noted. Mild residual RIGHT basilar infiltrate noted. No overt pulmonary edema.  IMPRESSION: 1. Improvement in the airspace disease seen on CT from 11/12/2014. 2. Mild RIGHT basilar infiltrate remains.   Electronically Signed   By: Genevive Bi M.D.   On: 11/18/2014 09:20     --Wells Guiles, MD.  Pager [phone] [place] [facility] Office Number: [phone]  Santiago Glad, M.D.  Stephanie Acre, M.D.  Carolyne Fiscal, M.D

## 2014-11-19 LAB — BASIC METABOLIC PANEL
Anion gap: 13 (ref 5–15)
BUN: 60 mg/dL — AB (ref 6–20)
CO2: 32 mmol/L (ref 22–32)
Calcium: 8.2 mg/dL — ABNORMAL LOW (ref 8.9–10.3)
Chloride: 90 mmol/L — ABNORMAL LOW (ref 101–111)
Creatinine, Ser: 5.32 mg/dL — ABNORMAL HIGH (ref 0.44–1.00)
GFR, EST AFRICAN AMERICAN: 9 mL/min — AB (ref >60)
GFR, EST NON AFRICAN AMERICAN: 7 mL/min — AB (ref >60)
Glucose, Bld: 110 mg/dL — ABNORMAL HIGH (ref 65–99)
POTASSIUM: 4.8 mmol/L (ref 3.5–5.1)
Sodium: 135 mmol/L (ref 135–145)

## 2014-11-19 LAB — CBC
HEMATOCRIT: 23.3 % — AB (ref 35.0–47.0)
Hemoglobin: 7.8 g/dL — ABNORMAL LOW (ref 12.0–16.0)
MCH: 32.6 pg (ref 26.0–34.0)
MCHC: 33.5 g/dL (ref 32.0–36.0)
MCV: 97.3 fL (ref 80.0–100.0)
PLATELETS: 210 10*3/uL (ref 150–440)
RBC: 2.39 MIL/uL — ABNORMAL LOW (ref 3.80–5.20)
RDW: 16 % — AB (ref 11.5–14.5)
WBC: 9.1 10*3/uL (ref 3.6–11.0)

## 2014-11-19 MED ORDER — MIDODRINE HCL 5 MG PO TABS
5.0000 mg | ORAL_TABLET | Freq: Three times a day (TID) | ORAL | Status: DC
  Administered 2014-11-20 – 2014-11-22 (×7): 5 mg via ORAL
  Filled 2014-11-19 (×7): qty 1

## 2014-11-19 MED ORDER — MIDODRINE HCL 5 MG PO TABS
5.0000 mg | ORAL_TABLET | Freq: Once | ORAL | Status: AC
  Administered 2014-11-19: 5 mg via ORAL
  Filled 2014-11-19: qty 1

## 2014-11-19 MED ORDER — MIDODRINE HCL 5 MG PO TABS: 5.0000 mg | ORAL_TABLET | Freq: Three times a day (TID) | ORAL | Status: DC

## 2014-11-19 MED ORDER — FERROUS SULFATE 325 (65 FE) MG PO TABS
325.0000 mg | ORAL_TABLET | Freq: Two times a day (BID) | ORAL | Status: DC
  Administered 2014-11-19 – 2014-11-22 (×8): 325 mg via ORAL
  Filled 2014-11-19 (×8): qty 1

## 2014-11-19 NOTE — Care Management Important Message (Signed)
Important Message  Patient Details  Name: Ariel Smith MRN: [id number] Date of Birth: 1946-01-12   Medicare Important Message Given:  Yes-second notification given    Olegario Messier A Allmond 11/19/2014, 9:50 AM

## 2014-11-19 NOTE — Evaluation (Signed)
Physical Therapy Evaluation Patient Details Name: Ariel Smith MRN: [id number] DOB: Aug 20, 1945 Today's Date: 11/19/2014   History of Present Illness  presented to ER with progrsesive weakness, black/tarry stools; admitted with acute hemorrhagic anemia related to upper GIB.  Hosital course significant for EGD showing no active bleed; L LE DVT s/p IVC filter placed (7/25), unable to anticoagulate secondary to GIB; respiratory distress related to bilat PNA, now on 2-3L supplemental O2 via .  Clinical Impression  Upon evaluation, patient alert and oriented, noticeably fatigued.  Bilat UE/LE ROM grossly WFL, but strength generally weak and deconditioned.  Currently requiring min assist for sit/stand, basic transfers and short-distance gait (20' x2) with RW; unable to tolerate additional distance due to fatigue (BORG 9/10 after 20' gait distance).  Sats maintained >94% on 3L supplemental O2 throughout session.  Patient very motivated/eager to progress, but presented with noted deficits in cardiopulmonary endurance and overall activity tolerance (unable to demonstrate mobility necessary for safe return home). Would benefit from skilled PT to address above deficits and promote optimal return to PLOF; recommend transition to STR upon discharge from acute hospitalization. Patient/family aware of recommendations and in agreement with plan.     Follow Up Recommendations SNF    Equipment Recommendations  Rolling walker with 5" wheels    Recommendations for Other Services       Precautions / Restrictions Precautions Precautions: Fall Precaution Comments: No BP L UE (AVF), L IJ central line Restrictions Weight Bearing Restrictions: No      Mobility  Bed Mobility Overal bed mobility: Needs Assistance Bed Mobility: Supine to Sit     Supine to sit: Supervision        Transfers Overall transfer level: Needs assistance Equipment used: Rolling walker (2 wheeled) Transfers: Sit to/from  Stand Sit to Stand: Min assist         General transfer comment: cuing for hand placement; requires UE support to complete  Ambulation/Gait Ambulation/Gait assistance: Min assist Ambulation Distance (Feet): 20 Feet Assistive device: Rolling walker (2 wheeled)     Gait velocity interpretation: <1.8 ft/sec, indicative of risk for recurrent falls General Gait Details: broad BOS with limited heel strike/toe off; heavy WBing bilat UEs on RW.  BORG 9/10 after above distance; unable to tolerate additional distance at this time.  Sats remain >94% on 3L with activity.  Stairs            Wheelchair Mobility    Modified Rankin (Stroke Patients Only)       Balance Overall balance assessment: Needs assistance Sitting-balance support: No upper extremity supported;Feet supported Sitting balance-Leahy Scale: Good       Standing balance-Leahy Scale: Fair                               Pertinent Vitals/Pain Pain Assessment: No/denies pain    Home Living Family/patient expects to be discharged to:: Private residence Living Arrangements: Spouse/significant other Available Help at Discharge: Family Type of Home: House Home Access: Stairs to enter Entrance Stairs-Rails: Right Entrance Stairs-Number of Steps: 5 Home Layout: One level Home Equipment: Cane - single point      Prior Function Level of Independence: Independent with assistive device(s)         Comments: Indep with household and limited community with [facility] as needed; reports 1 fall in previous year.  Husband transports to/from dialysis.     Hand Dominance        Extremity/Trunk  Assessment   Upper Extremity Assessment: Generalized weakness           Lower Extremity Assessment: Generalized weakness (ROM grossly WFL, strength at least 3+/5 proximally and 4-/5 distally)         Communication   Communication: No difficulties  Cognition Arousal/Alertness: Awake/alert Behavior During  Therapy: WFL for tasks assessed/performed Overall Cognitive Status: Within Functional Limits for tasks assessed                      General Comments      Exercises Other Exercises Other Exercises: Toilet transfer, ambulatory with RW, min assist for balance, surface changes and walker management.  Sit/stand from [facility] (placed over toilet), min assist; requires UE on grab bar to complete.  Min assist for standing balacne during hygiene, clothing management; dep for clothing management. (8 minutes)      Assessment/Plan    PT Assessment Patient needs continued PT services  PT Diagnosis Difficulty walking;Generalized weakness   PT Problem List Decreased strength;Decreased range of motion;Decreased activity tolerance;Decreased balance;Decreased mobility;Decreased knowledge of use of DME;Decreased safety awareness;Decreased knowledge of precautions;Cardiopulmonary status limiting activity  PT Treatment Interventions DME instruction;Gait training;Stair training;Functional mobility training;Therapeutic activities;Therapeutic exercise;Balance training;Patient/family education   PT Goals (Current goals can be found in the Care Plan section) Acute Rehab PT Goals Patient Stated Goal: "To do as much as I can; I want to feel like I'm doing something" PT Goal Formulation: With patient Time For Goal Achievement: 12/03/14 Potential to Achieve Goals: Good    Frequency Min 2X/week   Barriers to discharge Decreased caregiver support;Inaccessible home environment      Co-evaluation               End of Session Equipment Utilized During Treatment: Gait belt;Oxygen Activity Tolerance: Patient tolerated treatment well Patient left: in chair;with call bell/phone within reach;with chair alarm set;with family/visitor present Nurse Communication: Mobility status (O2 response to activity)         Time: 1610-9604 PT Time Calculation (min) (ACUTE ONLY): 31 min   Charges:   PT  Evaluation $Initial PT Evaluation Tier I: 1 Procedure PT Treatments $Therapeutic Activity: 8-22 mins   PT G Codes:        Mrytle Bento H. Manson Passey, PT, DPT, NCS 11/19/2014, 1:38 PM [phone]

## 2014-11-19 NOTE — Progress Notes (Signed)
[facility]  ROUNDING NOTE   Subjective:   Transferred to telemetry. Family at bedside.  4L [place]  Objective:  Vital signs in last 24 hours:  Temp:  [97.8 F (36.6 C)-98.7 F (37.1 C)] 97.8 F (36.6 C) (08/01 0515) Pulse Rate:  [84-149] 84 (08/01 0515) Resp:  [14-24] 20 (08/01 0515) BP: (93-113)/(46-73) 103/48 mmHg (08/01 0515) SpO2:  [81 %-100 %] 94 % (08/01 0806)  Weight change:  Filed Weights   11/16/14 1200 11/16/14 1526 11/17/14 1010  Weight: 105 kg (231 lb 7.7 oz) 100.6 kg (221 lb 12.5 oz) 103.8 kg (228 lb 13.4 oz)    Intake/Output: I/O last 3 completed shifts: In: 574 [NG/GT:474; IV Piggyback:100] Out: -    Intake/Output this shift:     Physical Exam: General: Resting comfortably  Head: Normocephalic, atraumatic.  Eyes: Anicteric  Neck: Supple, trachea midline  Lungs:  Diminished, 4 Litres   Heart: Regular, +murmur  Abdomen:  Soft, nontender, BS present  Extremities:  no peripheral edema.  Neurologic: Nonfocal, moving all four extremities  Skin: No lesions  Access: LUE AVF    Basic Metabolic Panel:  Recent Labs Lab 11/13/14 0736 11/16/14 1224 11/17/14 0540 11/18/14 0529 11/19/14 0450  NA 136 142 141 138 135  K 4.6 4.3 4.0 4.0 4.8  CL 98* 99* 98* 94* 90*  CO2 26 33* 33* 34* 32  GLUCOSE 172* 115* 97 99 110*  BUN 50* 38* 30* 30* 60*  CREATININE 6.57* 4.03* 3.40* 3.77* 5.32*  CALCIUM 8.0* 8.3* 8.1* 8.1* 8.2*  PHOS 4.9* 1.8*  --   --   --     Liver Function Tests:  Recent Labs Lab 11/13/14 0736 11/16/14 1224  ALBUMIN 2.9* 2.8*   No results for input(s): LIPASE, AMYLASE in the last 168 hours. No results for input(s): AMMONIA in the last 168 hours.  CBC:  Recent Labs Lab 11/15/14 0438 11/16/14 0444 11/17/14 0540 11/18/14 0529 11/19/14 0450  WBC  --  6.4 6.9 8.7 9.1  HGB 7.6* 7.3* 7.4* 7.8* 7.8*  HCT  --  22.2* 22.3* 23.6* 23.3*  MCV  --  98.4 98.2 98.0 97.3  PLT  --  158 183 207 210    Cardiac Enzymes: No  results for input(s): CKTOTAL, CKMB, CKMBINDEX, TROPONINI in the last 168 hours.  BNP: Invalid input(s): POCBNP  CBG: No results for input(s): GLUCAP in the last 168 hours.  Microbiology: Results for orders placed or performed during the hospital encounter of 11/09/14  MRSA PCR Screening     Status: None   Collection Time: 11/09/14  9:40 PM  Result Value Ref Range Status   MRSA by PCR NEGATIVE NEGATIVE Final    Comment:        The GeneXpert MRSA Assay (FDA approved for NASAL specimens only), is one component of a comprehensive MRSA colonization surveillance program. It is not intended to diagnose MRSA infection nor to guide or monitor treatment for MRSA infections.     Coagulation Studies: No results for input(s): LABPROT, INR in the last 72 hours.  Urinalysis: No results for input(s): COLORURINE, LABSPEC, PHURINE, GLUCOSEU, HGBUR, BILIRUBINUR, KETONESUR, PROTEINUR, UROBILINOGEN, NITRITE, LEUKOCYTESUR in the last 72 hours.  Invalid input(s): APPERANCEUR    Imaging: Dg Chest 1 View  11/18/2014   CLINICAL DATA:  Short of breath  EXAM: CHEST  1 VIEW  COMPARISON:  Radiograph 11/17/2014  FINDINGS: LEFT central venous line unchanged. Stable cardiac silhouette. There is central venous pulmonary congestion unchanged. Mild basilar atelectasis  noted. Mild residual RIGHT basilar infiltrate noted. No overt pulmonary edema.  IMPRESSION: 1. Improvement in the airspace disease seen on CT from 11/12/2014. 2. Mild RIGHT basilar infiltrate remains.   Electronically Signed   By: Genevive Bi M.D.   On: 11/18/2014 09:20     Medications:     . ALPRAZolam  1 mg Oral TID  . budesonide (PULMICORT) nebulizer solution  0.25 mg Nebulization BID  . citalopram  20 mg Oral Daily  . docusate sodium  100 mg Oral BID  . donepezil  5 mg Oral QHS  . feeding supplement (NEPRO CARB STEADY)  237 mL Oral BID BM  . ipratropium-albuterol  3 mL Nebulization TID  . multivitamin with minerals  1 tablet  Oral Daily  . pantoprazole (PROTONIX) IV  40 mg Intravenous Q12H  . piperacillin-tazobactam (ZOSYN)  IV  3.375 g Intravenous Q12H  . pravastatin  20 mg Oral q1800  . sevelamer carbonate  1,600 mg Oral TID WC  . traZODone  25-50 mg Oral QHS  . vancomycin  750 mg Intravenous Q T,Th,Sa-HD   sodium chloride, sodium chloride, albuterol, benzonatate, chlorpheniramine-HYDROcodone, hydrOXYzine, lidocaine (PF), lidocaine-prilocaine, menthol-cetylpyridinium, pentafluoroprop-tetrafluoroeth, sevelamer carbonate, traMADol, traMADol, zolpidem  Assessment/ Plan:  69 y.o. female with past medical history of ESRD on HD TTHS, SHPTH, AOCD, HTN, hyperlipidemia, COPD, DVT with PE, tx with long term coumadin and IVC filter placement, L knee TKA, R shoulder surgery  [facility] TTS [address]  1. End-stage renal disease on hemodialysis: three days of hemodialysis. UF of 15.5 litres total.  Monitor daily for dialysis need. Next treatment tentatively for Tuesday. No acute indication for dialysis.   2. Anemia of chronic kidney disease/anemia of blood loss. Hemoglobin 7.8. Secondary to GI bleed. No recent bleeding - status post transfusion PRBC on 7/22 - epo with HD.   3. Secondary hyperparathyroidism. PTH 142 (goal 150-600). Phos low at 1.8 - discontinued calcitriol and cinacalcet.  - Continue renvela, dose reduced  4. Acute exacerbation of COPD with Respiratory Distress with acute exacerbation of congestive heart failure: improved clinically. Patient is DNR - solumedrol, empiric vanc and zosyn.  - nebs - Continue oxygen.  - echocardiogram showing aortic stenosis. Cardiology consulted. Recommend fluid restriction.   5. Actue DVT: with history of DVT. Status post IVC placed 7/25 [name].  - was on warfarin in the past.  - currently not on anticoagulation due to active GI bleed. Will need something like Eliquis on discharge. Failed outpatient warfarin. Will wait for GI to clear for clearance for  anticoagulation.    LOS: 10 Ariel Smith 8/1/20169:01 AM

## 2014-11-19 NOTE — Progress Notes (Signed)
Dr. Allena Katz notified of pt low bp. No new orders at this time. Stated that he will review chart. Will cont to assess [employer]

## 2014-11-19 NOTE — Care Management Note (Signed)
Patient is active at [facility] [address] on TTS schedule.  I will send updated records to clinic at discharge. Ivor Reining Dialysis Liaison  [phone]

## 2014-11-19 NOTE — Progress Notes (Signed)
Patient ID: MARVELOUS WOOLFORD, female   DOB: 06-25-1945, 69 y.o.   MRN: [id number]   [facility] PROGRESS NOTE  PCP: [name], MD  HPI/Subjective: Patient is feeling okay denies any chest pain shortness of breath improved  Objective: Filed Vitals:   11/19/14 1122  BP: 100/32  Pulse: 89  Temp: 98 F (36.7 C)  Resp: 20    Filed Weights   11/16/14 1200 11/16/14 1526 11/17/14 1010  Weight: 105 kg (231 lb 7.7 oz) 100.6 kg (221 lb 12.5 oz) 103.8 kg (228 lb 13.4 oz)    ROS: Review of Systems  Constitutional: Negative for fever and chills.  Eyes: Negative for blurred vision.  Respiratory: Positive for cough and shortness of breath improved. Negative for sputum production.   Cardiovascular: Negative for chest pain.  Gastrointestinal: Negative for nausea, vomiting, abdominal pain, diarrhea and constipation.  Genitourinary: Negative for dysuria.  Musculoskeletal: Positive for neck pain. Negative for joint pain.  Neurological: Negative for dizziness and headaches.   Exam: Physical Exam  Constitutional: She is oriented to person, place, and time.  HENT:  Nose: No mucosal edema.  Mouth/Throat: No oropharyngeal exudate or posterior oropharyngeal edema.  Eyes: Conjunctivae, EOM and lids are normal. Pupils are equal, round, and reactive to light.  Neck: No JVD present. Carotid bruit is not present. No edema present. No thyroid mass and no thyromegaly present.  Cardiovascular: Regular rhythm, S1 normal and S2 normal.  Exam reveals no gallop.   Murmur heard.  Systolic murmur is present with a grade of 3/6  Pulses:      Dorsalis pedis pulses are 2+ on the right side, and 2+ on the left side.  Respiratory: No respiratory distress. She has decreased breath sounds in the right lower field and the left lower field. She has no wheezes. She has rhonchi in the right lower field and the left lower field. She has no rales.  GI: Soft. Bowel sounds are normal. There is no  tenderness.  Musculoskeletal:       Right ankle: She exhibits swelling.       Left ankle: She exhibits swelling.  Lymphadenopathy:    She has no cervical adenopathy.  Neurological: She is alert and oriented to person, place, and time. No cranial nerve deficit.  Skin: Skin is warm. No rash noted. Nails show no clubbing.  Psychiatric: She has a normal mood and affect.    Data Reviewed: Basic Metabolic Panel:  Recent Labs Lab 11/13/14 0736 11/16/14 1224 11/17/14 0540 11/18/14 0529 11/19/14 0450  NA 136 142 141 138 135  K 4.6 4.3 4.0 4.0 4.8  CL 98* 99* 98* 94* 90*  CO2 26 33* 33* 34* 32  GLUCOSE 172* 115* 97 99 110*  BUN 50* 38* 30* 30* 60*  CREATININE 6.57* 4.03* 3.40* 3.77* 5.32*  CALCIUM 8.0* 8.3* 8.1* 8.1* 8.2*  PHOS 4.9* 1.8*  --   --   --    Liver Function Tests:  Recent Labs Lab 11/13/14 0736 11/16/14 1224  ALBUMIN 2.9* 2.8*   CBC:  Recent Labs Lab 11/15/14 0438 11/16/14 0444 11/17/14 0540 11/18/14 0529 11/19/14 0450  WBC  --  6.4 6.9 8.7 9.1  HGB 7.6* 7.3* 7.4* 7.8* 7.8*  HCT  --  22.2* 22.3* 23.6* 23.3*  MCV  --  98.4 98.2 98.0 97.3  PLT  --  158 183 207 210       Studies: Dg Chest 1 View  11/18/2014   CLINICAL DATA:  Short  of breath  EXAM: CHEST  1 VIEW  COMPARISON:  Radiograph 11/17/2014  FINDINGS: LEFT central venous line unchanged. Stable cardiac silhouette. There is central venous pulmonary congestion unchanged. Mild basilar atelectasis noted. Mild residual RIGHT basilar infiltrate noted. No overt pulmonary edema.  IMPRESSION: 1. Improvement in the airspace disease seen on CT from 11/12/2014. 2. Mild RIGHT basilar infiltrate remains.   Electronically Signed   By: Genevive Bi M.D.   On: 11/18/2014 09:20    Scheduled Meds: . ALPRAZolam  1 mg Oral TID  . budesonide (PULMICORT) nebulizer solution  0.25 mg Nebulization BID  . citalopram  20 mg Oral Daily  . docusate sodium  100 mg Oral BID  . donepezil  5 mg Oral QHS  . feeding supplement  (NEPRO CARB STEADY)  237 mL Oral BID BM  . ferrous sulfate  325 mg Oral BID WC  . ipratropium-albuterol  3 mL Nebulization TID  . multivitamin with minerals  1 tablet Oral Daily  . pantoprazole (PROTONIX) IV  40 mg Intravenous Q12H  . piperacillin-tazobactam (ZOSYN)  IV  3.375 g Intravenous Q12H  . pravastatin  20 mg Oral q1800  . sevelamer carbonate  1,600 mg Oral TID WC  . traZODone  25-50 mg Oral QHS  . vancomycin  750 mg Intravenous Q T,Th,Sa-HD    Assessment/Plan:  1. Acute on chronic respiratory failure with hypoxia. Continue oxygen via nasal cannula.  Changed to prednisone daily daily.  Likely combination of fluid overload, pneumonia and even possibly pulmonary embolism prior to IVC filter placement. 2. Acute pneumonia bilaterally.  Changed to by mouth Augmentin 3. Fluid overload with IV fluids and blood-  now improved 4.   Acute blood loss anemia- hemoglobin stable at 7.8    Likely upper GI bleed- endoscopy only showed gastritis. Patient is on Protonix. Monitor hemoglobin  6.   Hypovolemic shock- blood pressure stable. Continue to monitor. Appears to be stable  7.   End-stage renal disease on hemodialysis- hemodialysis as per nephrology. 8.   Left lower extremity DVT- s/p IVC filter. Anticoagulation on hold at this point would not restart Coumadin for now will have follow-up with hematology as outpatient to decide whether resumption of his Coumadin is appropriate of her hemoglobin stays stable 9.   Severe aortic stenosis 10. DO NOT RESUSCITATE  Disposition Plan: To be determined  Consultants:  Gastrointestinal  Nephrology  Vascular surgery  Pulmonary  Procedures:  Endoscopy  IVC filter placement  Time spent: 20 minutes  Tikia Skilton, [facility]  [facility] [place] [facility]

## 2014-11-19 NOTE — Progress Notes (Signed)
Patient alert and oriented x4, no complaints at this time. Patient NSR on telemetry. Will continue to assess. BP rechecked, bp has increased but still low. Will cont to assess. Trudee Kuster

## 2014-11-19 NOTE — Progress Notes (Signed)
A&O. Transferred from CCU. NO complaints. O2 at 4L. VSS. Daughter at side.

## 2014-11-19 NOTE — Care Management (Signed)
Patient has transferred to 2A from icu.  Physical therapy has recommended skilled nursing and patient is in agreement.  CSW is aware

## 2014-11-20 LAB — RENAL FUNCTION PANEL
ALBUMIN: 2.7 g/dL — AB (ref 3.5–5.0)
ANION GAP: 13 (ref 5–15)
BUN: 83 mg/dL — ABNORMAL HIGH (ref 6–20)
CHLORIDE: 88 mmol/L — AB (ref 101–111)
CO2: 32 mmol/L (ref 22–32)
Calcium: 8.4 mg/dL — ABNORMAL LOW (ref 8.9–10.3)
Creatinine, Ser: 6.87 mg/dL — ABNORMAL HIGH (ref 0.44–1.00)
GFR calc Af Amer: 6 mL/min — ABNORMAL LOW (ref >60)
GFR, EST NON AFRICAN AMERICAN: 6 mL/min — AB (ref >60)
GLUCOSE: 94 mg/dL (ref 65–99)
Phosphorus: 2.4 mg/dL — ABNORMAL LOW (ref 2.5–4.6)
Potassium: 5.6 mmol/L — ABNORMAL HIGH (ref 3.5–5.1)
Sodium: 133 mmol/L — ABNORMAL LOW (ref 135–145)

## 2014-11-20 LAB — CBC WITH DIFFERENTIAL/PLATELET
BASOS PCT: 1 %
Basophils Absolute: 0.1 10*3/uL (ref 0–0.1)
EOS ABS: 0.4 10*3/uL (ref 0–0.7)
Eosinophils Relative: 4 %
HCT: 22.5 % — ABNORMAL LOW (ref 35.0–47.0)
Hemoglobin: 7.5 g/dL — ABNORMAL LOW (ref 12.0–16.0)
Lymphocytes Relative: 13 %
Lymphs Abs: 1.2 10*3/uL (ref 1.0–3.6)
MCH: 32.6 pg (ref 26.0–34.0)
MCHC: 33.3 g/dL (ref 32.0–36.0)
MCV: 98 fL (ref 80.0–100.0)
Monocytes Absolute: 0.9 10*3/uL (ref 0.2–0.9)
Monocytes Relative: 10 %
NEUTROS ABS: 6.6 10*3/uL — AB (ref 1.4–6.5)
Neutrophils Relative %: 72 %
Platelets: 230 10*3/uL (ref 150–440)
RBC: 2.3 MIL/uL — ABNORMAL LOW (ref 3.80–5.20)
RDW: 16.3 % — ABNORMAL HIGH (ref 11.5–14.5)
WBC: 9.1 10*3/uL (ref 3.6–11.0)

## 2014-11-20 LAB — CBC
HCT: 24.1 % — ABNORMAL LOW (ref 35.0–47.0)
Hemoglobin: 7.9 g/dL — ABNORMAL LOW (ref 12.0–16.0)
MCH: 32 pg (ref 26.0–34.0)
MCHC: 32.7 g/dL (ref 32.0–36.0)
MCV: 97.8 fL (ref 80.0–100.0)
Platelets: 241 10*3/uL (ref 150–440)
RBC: 2.46 MIL/uL — ABNORMAL LOW (ref 3.80–5.20)
RDW: 15.9 % — ABNORMAL HIGH (ref 11.5–14.5)
WBC: 10.1 10*3/uL (ref 3.6–11.0)

## 2014-11-20 LAB — PREPARE RBC (CROSSMATCH)

## 2014-11-20 LAB — LACTATE DEHYDROGENASE: LDH: 204 U/L — AB (ref 98–192)

## 2014-11-20 MED ORDER — PANTOPRAZOLE SODIUM 40 MG PO TBEC
40.0000 mg | DELAYED_RELEASE_TABLET | Freq: Two times a day (BID) | ORAL | Status: DC
  Administered 2014-11-20 – 2014-11-21 (×4): 40 mg via ORAL
  Filled 2014-11-20 (×7): qty 1

## 2014-11-20 MED ORDER — ALBUMIN HUMAN 25 % IV SOLN
12.5000 g | Freq: Once | INTRAVENOUS | Status: AC
  Administered 2014-11-20: 12.5 g via INTRAVENOUS
  Filled 2014-11-20: qty 50

## 2014-11-20 MED ORDER — PANTOPRAZOLE SODIUM 40 MG PO TBEC
40.0000 mg | DELAYED_RELEASE_TABLET | Freq: Two times a day (BID) | ORAL | Status: DC
Start: 2014-11-20 — End: 2014-11-20
  Filled 2014-11-20 (×2): qty 1

## 2014-11-20 MED ORDER — SODIUM CHLORIDE 0.9 % IV SOLN
Freq: Once | INTRAVENOUS | Status: AC
  Administered 2014-11-20: 16:00:00 via INTRAVENOUS

## 2014-11-20 MED ORDER — EPOETIN ALFA 10000 UNIT/ML IJ SOLN
10000.0000 [IU] | Freq: Once | INTRAMUSCULAR | Status: AC
  Administered 2014-11-20: 10000 [IU] via INTRAVENOUS

## 2014-11-20 MED ORDER — AMOXICILLIN-POT CLAVULANATE 875-125 MG PO TABS
1.0000 | ORAL_TABLET | ORAL | Status: DC
  Administered 2014-11-20: 1 via ORAL
  Filled 2014-11-20 (×3): qty 1

## 2014-11-20 NOTE — Progress Notes (Signed)
HD PRE BLOOD VITALS

## 2014-11-20 NOTE — Progress Notes (Signed)
PRE HD   

## 2014-11-20 NOTE — Progress Notes (Signed)
HD START 

## 2014-11-20 NOTE — Progress Notes (Signed)
A&O. 2.5L O2. No complaints during the night. Ambien given for sleep. SLept well during the night. Visitor at side. IV zosyn given.

## 2014-11-20 NOTE — Progress Notes (Signed)
Patient ID: Ariel Smith, female   DOB: 05/16/1945, 69 y.o.   MRN: [id number]   [facility] PROGRESS NOTE  PCP: [name], MD  HPI/Subjective: She is hemoglobin is trending down again  Objective: Filed Vitals:   11/20/14 1200  BP: 112/66  Pulse: 96  Temp:   Resp: 22    Filed Weights   11/16/14 1526 11/17/14 1010 11/20/14 0930  Weight: 100.6 kg (221 lb 12.5 oz) 103.8 kg (228 lb 13.4 oz) 99.7 kg (219 lb 12.8 oz)    ROS: Review of Systems  Constitutional: Negative for fever and chills.  Eyes: Negative for blurred vision.  Respiratory: Positive for cough and shortness of breath improved. Negative for sputum production.   Cardiovascular: Negative for chest pain.  Gastrointestinal: Negative for nausea, vomiting, abdominal pain, diarrhea and constipation.  Genitourinary: Negative for dysuria.  Musculoskeletal: Positive for neck pain. Negative for joint pain.  Neurological: Negative for dizziness and headaches.   Exam: Physical Exam  Constitutional: She is oriented to person, place, and time.  HENT:  Nose: No mucosal edema.  Mouth/Throat: No oropharyngeal exudate or posterior oropharyngeal edema.  Eyes: Conjunctivae, EOM and lids are normal. Pupils are equal, round, and reactive to light.  Neck: No JVD present. Carotid bruit is not present. No edema present. No thyroid mass and no thyromegaly present.  Cardiovascular: Regular rhythm, S1 normal and S2 normal.  Exam reveals no gallop.   Murmur heard.  Systolic murmur is present with a grade of 3/6  Pulses:      Dorsalis pedis pulses are 2+ on the right side, and 2+ on the left side.  Respiratory: No respiratory distress. She has decreased breath sounds in the right lower field and the left lower field. She has no wheezes. She has rhonchi in the right lower field and the left lower field. She has no rales.  GI: Soft. Bowel sounds are normal. There is no tenderness.  Musculoskeletal:       Right ankle: She  exhibits swelling.       Left ankle: She exhibits swelling.  Lymphadenopathy:    She has no cervical adenopathy.  Neurological: She is alert and oriented to person, place, and time. No cranial nerve deficit.  Skin: Skin is warm. No rash noted. Nails show no clubbing.  Psychiatric: She has a normal mood and affect.    Data Reviewed: Basic Metabolic Panel:  Recent Labs Lab 11/16/14 1224 11/17/14 0540 11/18/14 0529 11/19/14 0450 11/20/14 0518  NA 142 141 138 135 133*  K 4.3 4.0 4.0 4.8 5.6*  CL 99* 98* 94* 90* 88*  CO2 33* 33* 34* 32 32  GLUCOSE 115* 97 99 110* 94  BUN 38* 30* 30* 60* 83*  CREATININE 4.03* 3.40* 3.77* 5.32* 6.87*  CALCIUM 8.3* 8.1* 8.1* 8.2* 8.4*  PHOS 1.8*  --   --   --  2.4*   Liver Function Tests:  Recent Labs Lab 11/16/14 1224 11/20/14 0518  ALBUMIN 2.8* 2.7*   CBC:  Recent Labs Lab 11/17/14 0540 11/18/14 0529 11/19/14 0450 11/20/14 0518 11/20/14 1043  WBC 6.9 8.7 9.1 9.1 10.1  NEUTROABS  --   --   --  6.6*  --   HGB 7.4* 7.8* 7.8* 7.5* 7.9*  HCT 22.3* 23.6* 23.3* 22.5* 24.1*  MCV 98.2 98.0 97.3 98.0 97.8  PLT 183 207 210 230 241       Studies: No results found.  Scheduled Meds: . sodium chloride   Intravenous Once  .  ALPRAZolam  1 mg Oral TID  . amoxicillin-clavulanate  1 tablet Oral Q24H  . budesonide (PULMICORT) nebulizer solution  0.25 mg Nebulization BID  . citalopram  20 mg Oral Daily  . docusate sodium  100 mg Oral BID  . donepezil  5 mg Oral QHS  . feeding supplement (NEPRO CARB STEADY)  237 mL Oral BID BM  . ferrous sulfate  325 mg Oral BID WC  . ipratropium-albuterol  3 mL Nebulization TID  . midodrine  5 mg Oral TID WC  . multivitamin with minerals  1 tablet Oral Daily  . pantoprazole  40 mg Oral BID  . pravastatin  20 mg Oral q1800  . sevelamer carbonate  1,600 mg Oral TID WC  . traZODone  25-50 mg Oral QHS  . vancomycin  750 mg Intravenous Q T,Th,Sa-HD    Assessment/Plan:  1. Acute on chronic respiratory  failure with hypoxia. Continue oxygen via nasal cannula.   prednisone daily daily.  Likely combination of fluid overload, pneumonia and even possibly pulmonary embolism prior to IVC filter placement. 2. Acute pneumonia bilaterally. By mouth anabiotic's 3. Fluid overload with IV fluids and blood-  now improved 4.   Acute blood loss anemia- hemoglobin dropped further   Likely upper GI bleed- endoscopy only showed gastritis. Patient is on Protonix. Monitor hemoglobin and rule out hemolysis with severe aortic stenosis,  6.   Hypovolemic shock- blood pressure stable. Continue to monitor. Appears to be stable  7.   End-stage renal disease on hemodialysis- hemodialysis as per nephrology. 8.   Left lower extremity DVT- s/p IVC filter. Anticoagulation on hold at this point would not restart Coumadin for now will have follow-up with hematology as outpatient to decide whether resumption of his Coumadin is appropriate of her hemoglobin stays stable 9.   Severe aortic stenosis.poor  10. DO NOT RESUSCITATE  Disposition Plan: To be determined  Consultants:  Gastrointestinal  Nephrology  Vascular surgery  Pulmonary  Procedures:  Endoscopy  IVC filter placement  Time spent: 20 minutes  Knight Oelkers, [facility]  [facility] [place] [facility]ists

## 2014-11-20 NOTE — Progress Notes (Signed)
[facility]  ROUNDING NOTE   Subjective:   Seen and examined on hemodialysis. Tolerating treatment. Given albumin IV due to hypotension UF has been difficult 1 unit PRBC ordered.  Discussed prognosis and plan of care with family earlier today.   Objective:  Vital signs in last 24 hours:  Temp:  [98 F (36.7 C)-98.7 F (37.1 C)] 98.7 F (37.1 C) (08/02 0930) Pulse Rate:  [83-94] 89 (08/02 1030) Resp:  [13-21] 17 (08/02 1030) BP: (82-109)/(32-60) 92/57 mmHg (08/02 1030) SpO2:  [94 %-98 %] 94 % (08/02 0930) Weight:  [99.7 kg (219 lb 12.8 oz)] 99.7 kg (219 lb 12.8 oz) (08/02 0930)  Weight change:  Filed Weights   11/16/14 1526 11/17/14 1010 11/20/14 0930  Weight: 100.6 kg (221 lb 12.5 oz) 103.8 kg (228 lb 13.4 oz) 99.7 kg (219 lb 12.8 oz)    Intake/Output: I/O last 3 completed shifts: In: 340 [P.O.:240; IV Piggyback:100] Out: 0    Intake/Output this shift:     Physical Exam: General: Resting comfortably  Head: Normocephalic, atraumatic.  Eyes: Anicteric  Neck: Supple, trachea midline  Lungs:  Diminished, 2.5 Litres [place]  Heart: Regular, +murmur  Abdomen:  Soft, nontender, BS present  Extremities:  no peripheral edema.  Neurologic: Nonfocal, moving all four extremities  Skin: No lesions  Access: LUE AVF    Basic Metabolic Panel:  Recent Labs Lab 11/16/14 1224 11/17/14 0540 11/18/14 0529 11/19/14 0450 11/20/14 0518  NA 142 141 138 135 133*  K 4.3 4.0 4.0 4.8 5.6*  CL 99* 98* 94* 90* 88*  CO2 33* 33* 34* 32 32  GLUCOSE 115* 97 99 110* 94  BUN 38* 30* 30* 60* 83*  CREATININE 4.03* 3.40* 3.77* 5.32* 6.87*  CALCIUM 8.3* 8.1* 8.1* 8.2* 8.4*  PHOS 1.8*  --   --   --  2.4*    Liver Function Tests:  Recent Labs Lab 11/16/14 1224 11/20/14 0518  ALBUMIN 2.8* 2.7*   No results for input(s): LIPASE, AMYLASE in the last 168 hours. No results for input(s): AMMONIA in the last 168 hours.  CBC:  Recent Labs Lab 11/16/14 0444 11/17/14 0540  11/18/14 0529 11/19/14 0450 11/20/14 0518  WBC 6.4 6.9 8.7 9.1 9.1  NEUTROABS  --   --   --   --  6.6*  HGB 7.3* 7.4* 7.8* 7.8* 7.5*  HCT 22.2* 22.3* 23.6* 23.3* 22.5*  MCV 98.4 98.2 98.0 97.3 98.0  PLT 158 183 207 210 230    Cardiac Enzymes: No results for input(s): CKTOTAL, CKMB, CKMBINDEX, TROPONINI in the last 168 hours.  BNP: Invalid input(s): POCBNP  CBG: No results for input(s): GLUCAP in the last 168 hours.  Microbiology: Results for orders placed or performed during the hospital encounter of 11/09/14  MRSA PCR Screening     Status: None   Collection Time: 11/09/14  9:40 PM  Result Value Ref Range Status   MRSA by PCR NEGATIVE NEGATIVE Final    Comment:        The GeneXpert MRSA Assay (FDA approved for NASAL specimens only), is one component of a comprehensive MRSA colonization surveillance program. It is not intended to diagnose MRSA infection nor to guide or monitor treatment for MRSA infections.     Coagulation Studies: No results for input(s): LABPROT, INR in the last 72 hours.  Urinalysis: No results for input(s): COLORURINE, LABSPEC, PHURINE, GLUCOSEU, HGBUR, BILIRUBINUR, KETONESUR, PROTEINUR, UROBILINOGEN, NITRITE, LEUKOCYTESUR in the last 72 hours.  Invalid input(s): APPERANCEUR  Imaging: No results found.   Medications:     . sodium chloride   Intravenous Once  . ALPRAZolam  1 mg Oral TID  . budesonide (PULMICORT) nebulizer solution  0.25 mg Nebulization BID  . citalopram  20 mg Oral Daily  . docusate sodium  100 mg Oral BID  . donepezil  5 mg Oral QHS  . feeding supplement (NEPRO CARB STEADY)  237 mL Oral BID BM  . ferrous sulfate  325 mg Oral BID WC  . ipratropium-albuterol  3 mL Nebulization TID  . midodrine  5 mg Oral TID WC  . multivitamin with minerals  1 tablet Oral Daily  . pantoprazole (PROTONIX) IV  40 mg Intravenous Q12H  . piperacillin-tazobactam (ZOSYN)  IV  3.375 g Intravenous Q12H  . pravastatin  20 mg Oral q1800   . sevelamer carbonate  1,600 mg Oral TID WC  . traZODone  25-50 mg Oral QHS  . vancomycin  750 mg Intravenous Q T,Th,Sa-HD   sodium chloride, sodium chloride, albuterol, benzonatate, chlorpheniramine-HYDROcodone, hydrOXYzine, lidocaine (PF), lidocaine-prilocaine, menthol-cetylpyridinium, pentafluoroprop-tetrafluoroeth, sevelamer carbonate, traMADol, traMADol, zolpidem  Assessment/ Plan:  69 y.o. female with past medical history of ESRD on HD TTHS, SHPTH, AOCD, HTN, hyperlipidemia, COPD, DVT with PE, tx with long term coumadin and IVC filter placement, L knee TKA, R shoulder surgery  [facility] TTS [address]  1. End-stage renal disease on hemodialysis: three days of hemodialysis. UF of 15.5 litres total.  Monitor daily for dialysis need. Next treatment tentatively for Tuesday. No acute indication for dialysis.   2. Anemia of chronic kidney disease/anemia of blood loss. Hemoglobin 7.5. Secondary to GI bleed. No recent bleeding - status post transfusion PRBC on 7/22 - epo with HD.  - ordered 1 unit PRBC with HD today.   3. Secondary hyperparathyroidism. PTH 142 (goal 150-600). Phos 2.4 - discontinued calcitriol and cinacalcet.  - Continue renvela, dose reduced  4. Acute exacerbation of COPD with Respiratory Distress with acute exacerbation of congestive heart failure: improved clinically. Patient is DNR - solumedrol, empiric vanc and zosyn.  - nebs - Continue oxygen.  - echocardiogram showing aortic stenosis.    5. Actue DVT: with history of DVT. Status post IVC placed 7/25 [name].  - was on warfarin in the past.  - currently not on anticoagulation due to active GI bleed. Will need something like Eliquis on discharge. Failed outpatient warfarin. Will wait for GI to clear for clearance for anticoagulation.   6. Hypotension: started on midodrine before HD treatments. Started 5mg  daily. Give 30-45 minutes before dialysis treatment.    LOS: 11 Cyprian Gongaware 8/2/201611:10  AM

## 2014-11-20 NOTE — Progress Notes (Signed)
Physical Therapy Treatment Patient Details Name: Ariel Smith MRN: [id number] DOB: [birth year] Today's Date: 11/20/2014    History of Present Illness presented to ER with progrsesive weakness, black/tarry stools; admitted with acute hemorrhagic anemia related to upper GIB.  Hosital course significant for EGD showing no active bleed; L LE DVT s/p IVC filter placed (7/25), unable to anticoagulate secondary to GIB; respiratory distress related to bilat PNA, now on 2-3L supplemental O2 via .    PT Comments    Initial attempt to see pt, pt was eating lunch after returning from hemodialysis. Second attempt, pt agreeable. Reports fatigue. Nursing notes pt will be receiving unit of blood; therefore, PT limited to bed exercises. Continue PT to progress strength, endurance and functional mobility.   Follow Up Recommendations  SNF     Equipment Recommendations  Rolling walker with 5" wheels    Recommendations for Other Services       Precautions / Restrictions Precautions Precautions: Fall Restrictions Weight Bearing Restrictions: No    Mobility  Bed Mobility               General bed mobility comments: Not tested  Transfers                 General transfer comment: Not tested  Ambulation/Gait                 Stairs            Wheelchair Mobility    Modified Rankin (Stroke Patients Only)       Balance                                    Cognition Arousal/Alertness: Awake/alert (Notes fatigued from dialysis) Behavior During Therapy: WFL for tasks assessed/performed Overall Cognitive Status: Within Functional Limits for tasks assessed                      Exercises General Exercises - Lower Extremity Ankle Circles/Pumps: AROM;Both;20 reps;Supine Quad Sets: Strengthening;Both;20 reps;Supine Gluteal Sets: Strengthening;Both;20 reps;Supine Short Arc Quad: AAROM;Both;20 reps;Supine Heel Slides: AAROM;Both;20 reps;Seated  (light resist with extension) Hip ABduction/ADduction: Strengthening;Both;20 reps;Supine (Light resist range of motion) Straight Leg Raises: AAROM;Both;Supine;15 reps    General Comments        Pertinent Vitals/Pain Pain Assessment: No/denies pain    Home Living                      Prior Function            PT Goals (current goals can now be found in the care plan section) Progress towards PT goals: Progressing toward goals    Frequency  Min 2X/week    PT Plan Current plan remains appropriate    Co-evaluation             End of Session   Activity Tolerance: Patient tolerated treatment well;Patient limited by fatigue Patient left: in bed;with call bell/phone within reach;with bed alarm set;with family/visitor present;with nursing/sitter in room     Time: 3568-6168 PT Time Calculation (min) (ACUTE ONLY): 20 min  Charges:  $Therapeutic Exercise: 8-22 mins                    G Codes:      Kristeen Miss 11/20/2014, 3:34 PM

## 2014-11-21 LAB — TYPE AND SCREEN
ABO/RH(D): O POS
Antibody Screen: NEGATIVE
UNIT DIVISION: 0

## 2014-11-21 LAB — CBC
HCT: 25.9 % — ABNORMAL LOW (ref 35.0–47.0)
Hemoglobin: 8.7 g/dL — ABNORMAL LOW (ref 12.0–16.0)
MCH: 32.6 pg (ref 26.0–34.0)
MCHC: 33.7 g/dL (ref 32.0–36.0)
MCV: 96.9 fL (ref 80.0–100.0)
Platelets: 224 10*3/uL (ref 150–440)
RBC: 2.67 MIL/uL — AB (ref 3.80–5.20)
RDW: 16.1 % — ABNORMAL HIGH (ref 11.5–14.5)
WBC: 10 10*3/uL (ref 3.6–11.0)

## 2014-11-21 LAB — HAPTOGLOBIN: Haptoglobin: 247 mg/dL — ABNORMAL HIGH (ref 34–200)

## 2014-11-21 MED ORDER — AMOXICILLIN-POT CLAVULANATE 250-125 MG PO TABS
1.0000 | ORAL_TABLET | ORAL | Status: DC
  Administered 2014-11-21: 1 via ORAL
  Filled 2014-11-21 (×2): qty 1

## 2014-11-21 NOTE — Care Management Important Message (Signed)
Important Message  Patient Details  Name: Ariel Smith MRN: [id number] Date of Birth: 03-Dec-1945   Medicare Important Message Given:  Yes-third notification given    Olegario Messier A Allmond 11/21/2014, 10:34 AM

## 2014-11-21 NOTE — Progress Notes (Signed)
Patient ID: Ariel Smith, female   DOB: [birth year], 69 y.o.   MRN: [id number]   [facility] PROGRESS NOTE  PCP: [name], MD  HPI/Subjective: Patient feels better was transfused yesterday blood pressures improved,   Objective: Filed Vitals:   11/21/14 1207  BP: 111/49  Pulse: 88  Temp: 97.9 F (36.6 C)  Resp: 17    Filed Weights   11/17/14 1010 11/20/14 0930 11/20/14 1315  Weight: 103.8 kg (228 lb 13.4 oz) 99.7 kg (219 lb 12.8 oz) 99.3 kg (218 lb 14.7 oz)    ROS: Review of Systems  Constitutional: Negative for fever and chills.  Eyes: Negative for blurred vision.  RespiratoryShortness of breath improvedve for sputum production.   Cardiovascular: Negative for chest pain.  Gastrointestinal: Negative for nausea, vomiting, abdominal pain, diarrhea and constipation.  Genitourinary: Negative for dysuria.  Musculoskeletal:  Negative for joint pain.  Neurological: Negative for dizziness and headaches.   Exam: Physical Exam  Constitutional: She is oriented to person, place, and time.  HENT:  Nose: No mucosal edema.  Mouth/Throat: No oropharyngeal exudate or posterior oropharyngeal edema.  Eyes: Conjunctivae, EOM and lids are normal. Pupils are equal, round, and reactive to light.  Neck: No JVD present. Carotid bruit is not present. No edema present. No thyroid mass and no thyromegaly present.  Cardiovascular: Regular rhythm, S1 normal and S2 normal.  Exam reveals no gallop.   Murmur heard.  Systolic murmur is present with a grade of 3/6  Pulses:      Dorsalis pedis pulses are 2+ on the right side, and 2+ on the left side.  Respiratory: No respiratory distress. She has decreased breath sounds in the right lower field and the left lower field. She has no wheezes. She has rhonchi in the right lower field and the left lower field. She has no rales.  GI: Soft. Bowel sounds are normal. There is no tenderness.  Musculoskeletal:       Right ankle: She  exhibits swelling.       Left ankle: She exhibits swelling.  Lymphadenopathy:    She has no cervical adenopathy.  Neurological: She is alert and oriented to person, place, and time. No cranial nerve deficit.  Skin: Skin is warm. No rash noted. Nails show no clubbing.  Psychiatric: She has a normal mood and affect.    Data Reviewed: Basic Metabolic Panel:  Recent Labs Lab 11/16/14 1224 11/17/14 0540 11/18/14 0529 11/19/14 0450 11/20/14 0518  NA 142 141 138 135 133*  K 4.3 4.0 4.0 4.8 5.6*  CL 99* 98* 94* 90* 88*  CO2 33* 33* 34* 32 32  GLUCOSE 115* 97 99 110* 94  BUN 38* 30* 30* 60* 83*  CREATININE 4.03* 3.40* 3.77* 5.32* 6.87*  CALCIUM 8.3* 8.1* 8.1* 8.2* 8.4*  PHOS 1.8*  --   --   --  2.4*   Liver Function Tests:  Recent Labs Lab 11/16/14 1224 11/20/14 0518  ALBUMIN 2.8* 2.7*   CBC:  Recent Labs Lab 11/18/14 0529 11/19/14 0450 11/20/14 0518 11/20/14 1043 11/21/14 0425  WBC 8.7 9.1 9.1 10.1 10.0  NEUTROABS  --   --  6.6*  --   --   HGB 7.8* 7.8* 7.5* 7.9* 8.7*  HCT 23.6* 23.3* 22.5* 24.1* 25.9*  MCV 98.0 97.3 98.0 97.8 96.9  PLT 207 210 230 241 224       Studies: No results found.  Scheduled Meds: . ALPRAZolam  1 mg Oral TID  . amoxicillin-clavulanate  1 tablet Oral Q24H  . budesonide (PULMICORT) nebulizer solution  0.25 mg Nebulization BID  . citalopram  20 mg Oral Daily  . docusate sodium  100 mg Oral BID  . donepezil  5 mg Oral QHS  . feeding supplement (NEPRO CARB STEADY)  237 mL Oral BID BM  . ferrous sulfate  325 mg Oral BID WC  . ipratropium-albuterol  3 mL Nebulization TID  . midodrine  5 mg Oral TID WC  . multivitamin with minerals  1 tablet Oral Daily  . pantoprazole  40 mg Oral BID  . pravastatin  20 mg Oral q1800  . sevelamer carbonate  1,600 mg Oral TID WC  . traZODone  25-50 mg Oral QHS  . vancomycin  750 mg Intravenous Q T,Th,Sa-HD    Assessment/Plan:  1. Acute on chronic respiratory failure with hypoxia. Continue oxygen  via nasal cannula.   prednisone daily .  Likely combination of fluid overload, pneumonia and even possibly pulmonary embolism prior to IVC filter placement. 2. Acute pneumonia bilaterallyBy mouth Augmentin 3.  Volume overload with IV fluids and blood-  now improved 4.   Acute blood loss anemia- hemoglobistatus post transfusion, stable no evidence of hemolysis 6.   Hypovolemic shock- blood pressure stable.  started on Medrol Dean 7.   End-stage renal disease on hemodialysis- hemodialysis as per nephrology. 8.   Left lower extremity DVT- s/p IVC filter. Anticoagulation on hold at this point would not restart Coumadin for now will have follow-up with hematology as outpatient to decide whether resumption of  Coumadin is appropriate of her hemoglobin stays stable 9.   Severe aortic stenosis.poor  prognosis  10. DO NOT RESUSCITATE  Disposition Plan: To be determined  Consultants:  Gastrointestinal  Nephrology  Vascular surgery  Pulmonary  Procedures:  Endoscopy  IVC filter placement  Time spent: 20 minutes  Jacora Hopkins, [facility]  [facility]

## 2014-11-21 NOTE — Care Management (Addendum)
Attending  Anticipates discharge to skilled nursing 8/4. Updated CSW.  Skilled nursing will require prior approval from [facility]

## 2014-11-21 NOTE — Progress Notes (Signed)
[facility]  ROUNDING NOTE   Subjective:  No family at bedside today.  Sitting in chair. Onward 2 litres Worked with PT after hemodialysis yesterday. Limited to bed exercises Hemodialysis yesterday. Tolerated treatment well. UF of 0 due to hypotension Status post 1 unit PRBC transfusion yesterday. Hemoglobin 8.7.   Objective:  Vital signs in last 24 hours:  Temp:  [98 F (36.7 C)-98.9 F (37.2 C)] 98.7 F (37.1 C) (08/03 0812) Pulse Rate:  [86-110] 95 (08/03 0812) Resp:  [13-24] 18 (08/03 0812) BP: (81-126)/(33-81) 117/66 mmHg (08/03 0812) SpO2:  [92 %-98 %] 94 % (08/03 0812) Weight:  [99.3 kg (218 lb 14.7 oz)-99.7 kg (219 lb 12.8 oz)] 99.3 kg (218 lb 14.7 oz) (08/02 1315)  Weight change:  Filed Weights   11/17/14 1010 11/20/14 0930 11/20/14 1315  Weight: 103.8 kg (228 lb 13.4 oz) 99.7 kg (219 lb 12.8 oz) 99.3 kg (218 lb 14.7 oz)    Intake/Output: I/O last 3 completed shifts: In: 16 [IV Piggyback:50] Out: 75 [Other:75]   Intake/Output this shift:     Physical Exam: General: Resting comfortably  Head: Normocephalic, atraumatic.  Eyes: Anicteric  Neck: Supple, trachea midline  Lungs:  Diminished, 2 Litres [place]  Heart: Regular, +murmur  Abdomen:  Soft, nontender, BS present  Extremities:  no peripheral edema.  Neurologic: Nonfocal, moving all four extremities  Skin: No lesions  Access: LUE AVF    Basic Metabolic Panel:  Recent Labs Lab 11/16/14 1224 11/17/14 0540 11/18/14 0529 11/19/14 0450 11/20/14 0518  NA 142 141 138 135 133*  K 4.3 4.0 4.0 4.8 5.6*  CL 99* 98* 94* 90* 88*  CO2 33* 33* 34* 32 32  GLUCOSE 115* 97 99 110* 94  BUN 38* 30* 30* 60* 83*  CREATININE 4.03* 3.40* 3.77* 5.32* 6.87*  CALCIUM 8.3* 8.1* 8.1* 8.2* 8.4*  PHOS 1.8*  --   --   --  2.4*    Liver Function Tests:  Recent Labs Lab 11/16/14 1224 11/20/14 0518  ALBUMIN 2.8* 2.7*   No results for input(s): LIPASE, AMYLASE in the last 168 hours. No results for input(s):  AMMONIA in the last 168 hours.  CBC:  Recent Labs Lab 11/18/14 0529 11/19/14 0450 11/20/14 0518 11/20/14 1043 11/21/14 0425  WBC 8.7 9.1 9.1 10.1 10.0  NEUTROABS  --   --  6.6*  --   --   HGB 7.8* 7.8* 7.5* 7.9* 8.7*  HCT 23.6* 23.3* 22.5* 24.1* 25.9*  MCV 98.0 97.3 98.0 97.8 96.9  PLT 207 210 230 241 224    Cardiac Enzymes: No results for input(s): CKTOTAL, CKMB, CKMBINDEX, TROPONINI in the last 168 hours.  BNP: Invalid input(s): POCBNP  CBG: No results for input(s): GLUCAP in the last 168 hours.  Microbiology: Results for orders placed or performed during the hospital encounter of 11/09/14  MRSA PCR Screening     Status: None   Collection Time: 11/09/14  9:40 PM  Result Value Ref Range Status   MRSA by PCR NEGATIVE NEGATIVE Final    Comment:        The GeneXpert MRSA Assay (FDA approved for NASAL specimens only), is one component of a comprehensive MRSA colonization surveillance program. It is not intended to diagnose MRSA infection nor to guide or monitor treatment for MRSA infections.     Coagulation Studies: No results for input(s): LABPROT, INR in the last 72 hours.  Urinalysis: No results for input(s): COLORURINE, LABSPEC, PHURINE, GLUCOSEU, HGBUR, BILIRUBINUR, KETONESUR, PROTEINUR, UROBILINOGEN,  NITRITE, LEUKOCYTESUR in the last 72 hours.  Invalid input(s): APPERANCEUR    Imaging: No results found.   Medications:     . ALPRAZolam  1 mg Oral TID  . amoxicillin-clavulanate  1 tablet Oral Q24H  . budesonide (PULMICORT) nebulizer solution  0.25 mg Nebulization BID  . citalopram  20 mg Oral Daily  . docusate sodium  100 mg Oral BID  . donepezil  5 mg Oral QHS  . feeding supplement (NEPRO CARB STEADY)  237 mL Oral BID BM  . ferrous sulfate  325 mg Oral BID WC  . ipratropium-albuterol  3 mL Nebulization TID  . midodrine  5 mg Oral TID WC  . multivitamin with minerals  1 tablet Oral Daily  . pantoprazole  40 mg Oral BID  . pravastatin  20 mg  Oral q1800  . sevelamer carbonate  1,600 mg Oral TID WC  . traZODone  25-50 mg Oral QHS  . vancomycin  750 mg Intravenous Q T,Th,Sa-HD   sodium chloride, sodium chloride, albuterol, benzonatate, chlorpheniramine-HYDROcodone, hydrOXYzine, lidocaine (PF), lidocaine-prilocaine, menthol-cetylpyridinium, pentafluoroprop-tetrafluoroeth, sevelamer carbonate, traMADol, traMADol, zolpidem  Assessment/ Plan:  69 y.o. female with past medical history of ESRD on HD TTHS, SHPTH, AOCD, HTN, hyperlipidemia, COPD, DVT with PE, tx with long term coumadin and IVC filter placement, L knee TKA, R shoulder surgery  [facility] TTS [address] [facility]  1. End-stage renal disease on hemodialysis: three days of hemodialysis. UF of 15.5 litres total.  Monitor daily for dialysis need.  No acute indication for dialysis.  - Continue TTS schedule.  - midodrine before next treatment to help with blood pressure and ultrafiltration.   2. Anemia of chronic kidney disease/anemia of blood loss. Hemoglobin 8.7. Secondary to GI bleed. No recent bleeding - status post transfusion PRBC on 7/22 and 8/2 - epo with HD.   3. Secondary hyperparathyroidism. PTH 142 (goal 150-600). Phos 2.4 - discontinued calcitriol and cinacalcet.  - Continue renvela, dose reduced  4. Acute exacerbation of COPD with Respiratory Distress with acute exacerbation of congestive heart failure: improved clinically. Patient is DNR - augmentin, vanco, off steroids. - Continue oxygen.  - echocardiogram showing aortic stenosis.    5. Actue DVT: with history of DVT. Status post IVC placed 7/25 [name].  - was on warfarin in the past.  - currently not on anticoagulation due to active GI bleed. Will need something like Eliquis on discharge. Failed outpatient warfarin. Will wait for GI to clear for clearance for anticoagulation.   6. Hypotension: started on midodrine before HD treatments. Started 5mg  daily. Give 30-45 minutes before dialysis treatment.    LOS:  12 Yarel Kilcrease 8/3/20168:59 AM

## 2014-11-21 NOTE — Progress Notes (Signed)
A&O. Slept well through the night. Ambien given to help sleep. Tramadol given for headache. On 2L O2.

## 2014-11-21 NOTE — Clinical Social Work Placement (Signed)
   CLINICAL SOCIAL WORK PLACEMENT  NOTE  Date:  11/21/2014  Patient Details  Name: JUDEEN CASABLANCA MRN: [id number] Date of Birth: [birth year]  Clinical Social Work is seeking post-discharge placement for this patient at the Skilled  Nursing Facility level of care (*CSW will initial, date and re-position this form in  chart as items are completed):  Yes   Patient/family provided with [facility] Clinical Social Work Department's list of facilities offering this level of care within the geographic area requested by the patient (or if unable, by the patient's family).  Yes   Patient/family informed of their freedom to choose among providers that offer the needed level of care, that participate in Medicare, Medicaid or managed care program needed by the patient, have an available bed and are willing to accept the patient.  Yes   Patient/family informed of [facility]'s ownership interest in [facility] and [facility], as well as of the fact that they are under no obligation to receive care at these facilities.  PASRR submitted to EDS on 11/21/14     PASRR number received on 11/21/14     Existing PASRR number confirmed on       FL2 transmitted to all facilities in geographic area requested by pt/family on 11/21/14     FL2 transmitted to all facilities within larger geographic area on       Patient informed that his/her managed care company has contracts with or will negotiate with certain facilities, including the following:   (pt and her husband given SNF packet with this information on it.)     Yes   Patient/family informed of bed offers received.  Patient chooses bed at       Physician recommends and patient chooses bed at      Patient to be transferred to   on  .  Patient to be transferred to facility by       Patient family notified on   of transfer.  Name of family member notified:        PHYSICIAN Please sign FL2     Additional Comment:     _______________________________________________ Chauncy Passy, LCSW 11/21/2014, 2:55 PM

## 2014-11-21 NOTE — Clinical Social Work Note (Signed)
Clinical Social Work Assessment  Patient Details  Name: Ariel Smith MRN: [id number] Date of Birth: Oct 11, 1945  Date of referral:  11/21/14               Reason for consult:  Facility Placement                Permission sought to share information with:  Family Supports, Magazine features editor Permission granted to share information::  Yes, Verbal Permission Granted  Name::     Kathlene November (Husband) Darl Pikes (Daughter)  Agency::  SNF facilities in [place]  Relationship::  Husband and Daughter  Contact Information:  Husband [phone]) Daughter [phone])  Housing/Transportation Living arrangements for the past 2 months:  Single Family Home Source of Information:  Patient, Spouse Patient Interpreter Needed:  None Criminal Activity/Legal Involvement Pertinent to Current Situation/Hospitalization:  No - Comment as needed Significant Relationships:  Spouse, Adult Children Lives with:  Spouse Do you feel safe going back to the place where you live?  Yes Need for family participation in patient care:  No (Coment) (But family is very supportive.)  Care giving concerns:  CSW spoke to pt and her husband in the pt's room.  Pt and pt's husband agreed that pt would need rehab before returning home.     Social Worker assessment / plan:  Per pt and pt's husband, they are the only who in the home.  Their daughter is also involved with the pt's care.  Pt is in agreement with DC to SNF.  She stated that she did not have any experience with SNF placement.  CSW explained that this would be temporary and it would not be for permanent placement.  CSW left a SNF packet with pt and her husband in the room.  Will f/u once offers have been received.  Employment status:  Retired Health and safety inspector:  Armed forces operational officer, Medicaid In [place] PT Recommendations:  Skilled Nursing Facility Information / Referral to community resources:  Skilled Nursing Facility  Patient/Family's Response to care:  Pt and husband  were in agreement with SNF DC.  Patient/Family's Understanding of and Emotional Response to Diagnosis, Current Treatment, and Prognosis:   Pt and husband were in agreement with SNF DC, and both verbalized their understanding on SNF DC.     Emotional Assessment Appearance:  Appears younger than stated age Attitude/Demeanor/Rapport:   (polite but tired) Affect (typically observed):  Appropriate Orientation:  Oriented to Self, Oriented to Place, Oriented to  Time, Oriented to Situation Alcohol / Substance use:  Never Used Psych involvement (Current and /or in the community):  No (Comment)  Discharge Needs  Concerns to be addressed:  Care Coordination Readmission within the last 30 days:    Current discharge risk:  Physical Impairment Barriers to Discharge:  No Barriers Identified   Chauncy Passy, LCSW 11/21/2014, 2:47 PM

## 2014-11-22 ENCOUNTER — Telehealth: Payer: Self-pay | Admitting: *Deleted

## 2014-11-22 LAB — CBC
HEMATOCRIT: 24.8 % — AB (ref 35.0–47.0)
Hemoglobin: 8.5 g/dL — ABNORMAL LOW (ref 12.0–16.0)
MCH: 33.9 pg (ref 26.0–34.0)
MCHC: 34.4 g/dL (ref 32.0–36.0)
MCV: 98.4 fL (ref 80.0–100.0)
Platelets: 196 10*3/uL (ref 150–440)
RBC: 2.52 MIL/uL — ABNORMAL LOW (ref 3.80–5.20)
RDW: 16.1 % — ABNORMAL HIGH (ref 11.5–14.5)
WBC: 9.8 10*3/uL (ref 3.6–11.0)

## 2014-11-22 LAB — RENAL FUNCTION PANEL
ANION GAP: 12 (ref 5–15)
Albumin: 2.8 g/dL — ABNORMAL LOW (ref 3.5–5.0)
BUN: 58 mg/dL — AB (ref 6–20)
CALCIUM: 8.9 mg/dL (ref 8.9–10.3)
CHLORIDE: 94 mmol/L — AB (ref 101–111)
CO2: 31 mmol/L (ref 22–32)
Creatinine, Ser: 6.63 mg/dL — ABNORMAL HIGH (ref 0.44–1.00)
GFR calc Af Amer: 7 mL/min — ABNORMAL LOW (ref >60)
GFR, EST NON AFRICAN AMERICAN: 6 mL/min — AB (ref >60)
Glucose, Bld: 130 mg/dL — ABNORMAL HIGH (ref 65–99)
PHOSPHORUS: 3 mg/dL (ref 2.5–4.6)
POTASSIUM: 5.2 mmol/L — AB (ref 3.5–5.1)
Sodium: 137 mmol/L (ref 135–145)

## 2014-11-22 LAB — VANCOMYCIN, TROUGH: VANCOMYCIN TR: 25 ug/mL — AB (ref 10–20)

## 2014-11-22 MED ORDER — TRAMADOL HCL 50 MG PO TABS: 50.0000 mg | ORAL_TABLET | Freq: Four times a day (QID) | ORAL | Status: DC | PRN

## 2014-11-22 MED ORDER — HYDROCOD POLST-CPM POLST ER 10-8 MG/5ML PO SUER: 5.0000 mL | Freq: Two times a day (BID) | ORAL | Status: DC | PRN

## 2014-11-22 MED ORDER — ZOLPIDEM TARTRATE 5 MG PO TABS: 5.0000 mg | ORAL_TABLET | Freq: Every evening | ORAL | Status: DC | PRN

## 2014-11-22 MED ORDER — ALBUMIN HUMAN 25 % IV SOLN
12.5000 g | Freq: Once | INTRAVENOUS | Status: AC
  Administered 2014-11-22: 12.5 g via INTRAVENOUS
  Filled 2014-11-22: qty 50

## 2014-11-22 MED ORDER — FERROUS SULFATE 325 (65 FE) MG PO TABS: 325.0000 mg | ORAL_TABLET | Freq: Two times a day (BID) | ORAL | Status: DC

## 2014-11-22 MED ORDER — SODIUM CHLORIDE 0.9 % IJ SOLN
10.0000 mL | Freq: Two times a day (BID) | INTRAMUSCULAR | Status: DC
  Administered 2014-11-22: 20 mL

## 2014-11-22 MED ORDER — IPRATROPIUM-ALBUTEROL 0.5-2.5 (3) MG/3ML IN SOLN: 3.0000 mL | Freq: Four times a day (QID) | RESPIRATORY_TRACT | Status: DC | PRN

## 2014-11-22 MED ORDER — PRAVASTATIN SODIUM 20 MG PO TABS: 20.0000 mg | ORAL_TABLET | Freq: Every day | ORAL | Status: DC

## 2014-11-22 MED ORDER — MIDODRINE HCL 5 MG PO TABS: 5.0000 mg | ORAL_TABLET | Freq: Three times a day (TID) | ORAL | Status: DC

## 2014-11-22 MED ORDER — ADULT MULTIVITAMIN W/MINERALS CH: 1.0000 | ORAL_TABLET | Freq: Every day | ORAL | Status: DC

## 2014-11-22 MED ORDER — ALPRAZOLAM 1 MG PO TABS: 1.0000 mg | ORAL_TABLET | Freq: Three times a day (TID) | ORAL | Status: DC

## 2014-11-22 MED ORDER — LIDOCAINE-PRILOCAINE 2.5-2.5 % EX CREA: 1.0000 "application " | TOPICAL_CREAM | CUTANEOUS | Status: AC | PRN

## 2014-11-22 MED ORDER — NEPRO/CARBSTEADY PO LIQD: 237.0000 mL | Freq: Two times a day (BID) | ORAL | Status: DC

## 2014-11-22 MED ORDER — AMOXICILLIN-POT CLAVULANATE 250-125 MG PO TABS: 1.0000 | ORAL_TABLET | Freq: Two times a day (BID) | ORAL | Status: AC

## 2014-11-22 MED ORDER — SODIUM CHLORIDE 0.9 % IJ SOLN
10.0000 mL | INTRAMUSCULAR | Status: DC | PRN
  Administered 2014-11-22: 10 mL
  Filled 2014-11-22: qty 40

## 2014-11-22 MED ORDER — BENZONATATE 100 MG PO CAPS: 100.0000 mg | ORAL_CAPSULE | Freq: Four times a day (QID) | ORAL | Status: DC | PRN

## 2014-11-22 MED ORDER — EPOETIN ALFA 10000 UNIT/ML IJ SOLN
10000.0000 [IU] | Freq: Once | INTRAMUSCULAR | Status: AC
  Administered 2014-11-22: 10000 [IU] via INTRAVENOUS

## 2014-11-22 MED ORDER — DOCUSATE SODIUM 100 MG PO CAPS: 100.0000 mg | ORAL_CAPSULE | Freq: Two times a day (BID) | ORAL | Status: DC

## 2014-11-22 NOTE — Progress Notes (Signed)
HD END 

## 2014-11-22 NOTE — Progress Notes (Signed)
HD START 

## 2014-11-22 NOTE — Progress Notes (Signed)
PRE HD   

## 2014-11-22 NOTE — Telephone Encounter (Signed)
Spoke to patient's husband per DPR.  She was discharged from the hospital this morning and is being admitted to a rehab facility, [facility] in [place], [place].  He reports that she has improved but is still not near baseline.  Will follow up once patient is discharged to home.

## 2014-11-22 NOTE — Clinical Social Work Note (Signed)
CSW notified pt, pt's husband, facility and RN that pt would DC to [facility] today via EMS.  CSW signing off.

## 2014-11-22 NOTE — Discharge Instructions (Signed)
°  DIET:  Renal diet  DISCHARGE CONDITION:  Good  ACTIVITY:  Activity as tolerated, pt eval and treat  OXYGEN:  Home Oxygen: Yes.     Oxygen Delivery: 2 liters/min via Patient connected to nasal cannula oxygen  DISCHARGE LOCATION:  home    ADDITIONAL DISCHARGE INSTRUCTION:   If you experience worsening of your admission symptoms, develop shortness of breath, life threatening emergency, suicidal or homicidal thoughts you must seek medical attention immediately by calling 911 or calling your MD immediately  if symptoms less severe.  You Must read complete instructions/literature along with all the possible adverse reactions/side effects for all the Medicines you take and that have been prescribed to you. Take any new Medicines after you have completely understood and accpet all the possible adverse reactions/side effects.   Please note  You were cared for by a hospitalist during your hospital stay. If you have any questions about your discharge medications or the care you received while you were in the hospital after you are discharged, you can call the unit and asked to speak with the hospitalist on call if the hospitalist that took care of you is not available. Once you are discharged, your primary care physician will handle any further medical issues. Please note that NO REFILLS for any discharge medications will be authorized once you are discharged, as it is imperative that you return to your primary care physician (or establish a relationship with a primary care physician if you do not have one) for your aftercare needs so that they can reassess your need for medications and monitor your lab values.

## 2014-11-22 NOTE — Discharge Summary (Signed)
Ariel Smith, 69 y.o., DOB 1945-12-17, MRN [id number]. Admission date: 11/09/2014 Discharge Date 11/22/2014 Primary MD [name], MD Admitting Physician Ramonita Lab, MD  Admission Diagnosis  Coagulopathy [D68.9] Hypotension, unspecified hypotension type [I95.9] Gastrointestinal hemorrhage, unspecified gastritis, unspecified gastrointestinal hemorrhage type [K92.2]  Discharge Diagnosis   Active Problems:   UGI bleed due to gastritis   Acute respiratory distress B/L pneumonia , fluid overload   ESRD on dialysis   HAP (hospital-acquired pneumonia)   Acute on chronic COPD exacerbation   DVT (deep venous thrombosis)   Acute respiratory failure with hypoxemia   Aortic valvar stenosis severe   Dyspnea   Left lower extremity DVT s/p IVC filter  Osteoarthritis Depression Recurrent headaches Hyperlipidemia GERD Secondary hyperparathyroidism of renal origin Osteopenia         Hospital Course Ariel Smith is a 69 y.o. female with a known history of hypertension, hyperlipidemia, DVT and multiple other medical problems on Coumadin is brought into the ED with a chief complaint of weakness and fatigue associated with black tarry stool.  Patient's hemoglobin was noted to be ED is at 6.4. Repeat hemoglobin is at 5.7. 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 Her INR was at 2.69. Patient was hospitalized for acute GI bleed and anemia. Her Coumadin was held and she received blood transfusion. Patient also was seen by GI and they performed an endoscopy. Which showed gastritis. Patient was monitored on PPIs. Also during hospitalization she developed acute respiratory failure and was noted to have bilateral pneumonia felt to be due to hospital-acquired pneumonia as well as fluid overload. Patient was treated with the aggressive IV anabiotic's as well as fluid removal through dialysis. Patient's respiratory status is now improved she continues to require 2 L of oxygen. Patient has  multiple medical problems and was made DO NOT RESUSCITATE. She is very weak and will need aggressive rehabilitation. Patient also was noted to have severe aortic stenosis and due to her multiple medical problems is not felt to be a good candidate for any type of intervention. If her status improves then can consider referral to for valve replacement. In terms of her chronic anticoagulation patient was noted to have lower extremity DVT therefore she underwent IVC filter placement. According to the family there is some history of hypercoagulable state if her hemoglobin is stable there may need to be reconsideration for resumption of Coumadin I will refer her to oncology for that as outpatient to decide.           Consults  cardiology and pulmonary/intensive care , nephrology Significant Tests:  See full reports for all details    Dg Chest 1 View  11/18/2014   CLINICAL DATA:  Short of breath  EXAM: CHEST  1 VIEW  COMPARISON:  Radiograph 11/17/2014  FINDINGS: LEFT central venous line unchanged. Stable cardiac silhouette. There is central venous pulmonary congestion unchanged. Mild basilar atelectasis noted. Mild residual RIGHT basilar infiltrate noted. No overt pulmonary edema.  IMPRESSION: 1. Improvement in the airspace disease seen on CT from 11/12/2014. 2. Mild RIGHT basilar infiltrate remains.   Electronically Signed   By: Genevive Bi M.D.   On: 11/18/2014 09:20   Dg Chest 1 View  11/17/2014   CLINICAL DATA:  69 year old female with a history of dyspnea.  EXAM: CHEST  1 VIEW  COMPARISON:  11/14/2014, chest CT 11/12/2014, chest x-ray 11/12/2014  FINDINGS: Cardiomediastinal silhouette unchanged.  Low lung volumes.  Improving interstitial and airspace opacities in the hilar regions and  lower lungs. Retrocardiac region not well evaluated. No large pleural effusion. No pneumothorax.  Unchanged position of left IJ approach central venous catheter.  Atherosclerotic calcifications of the aortic arch.   No displaced fracture.  IMPRESSION: Improving bilateral interstitial and airspace disease.  Unchanged central venous catheter.  Atherosclerosis.  Signed,  Yvone Neu. Loreta Ave, DO  [facility]  [facility]   Electronically Signed   By: Gilmer Mor D.O.   On: 11/17/2014 08:13   Dg Chest 1 View  11/14/2014   CLINICAL DATA:  Central line placement  EXAM: CHEST  1 VIEW  COMPARISON:  11/12/2014  FINDINGS: Left internal jugular central venous catheter place with its tip in the mid SVC and no left pneumothorax. Heart remains borderline enlarged. Bilateral airspace disease with a central and basilar distribution has worsened.  IMPRESSION: Left internal jugular central venous catheter place with its tip in the mid SVC and no pneumothorax.  Worsening bilateral airspace disease.   Electronically Signed   By: Jolaine Click M.D.   On: 11/14/2014 11:18   Ct Chest Wo Contrast  11/12/2014   CLINICAL DATA:  Exertional dyspnea.  EXAM: CT CHEST WITHOUT CONTRAST  TECHNIQUE: Multidetector CT imaging of the chest was performed following the standard protocol without IV contrast.  COMPARISON:  CT scan of August 15, 2013.  FINDINGS: No pneumothorax is noted. Mild bilateral pleural effusions are noted. Airspace opacity is noted in the upper and lower lobes bilaterally consistent with pneumonia or possibly edema. Atherosclerosis of thoracic aorta is noted without aneurysm formation. No significant mediastinal mass or adenopathy is noted. No significant osseous abnormality is noted. Visualized portion of upper abdomen demonstrates severe bilateral renal atrophy. Stable exophytic calcified abnormality is seen arising from right hepatic lobe consistent with benign abnormality.  IMPRESSION: Bilateral upper lobe and lower lobe opacities are noted consistent with pneumonia or edema. Mild bilateral pleural effusions are noted.   Electronically Signed   By: Lupita Raider, M.D.   On: 11/12/2014 12:23    [place] Extrem Low Bilat Comp  11/10/2014   ADDENDUM REPORT: 11/10/2014 19:00  ADDENDUM: Critical Value/emergent results were called by telephone at the time of interpretation on 11/10/2014 at 7:00 pm to Jenel Lucks, RN, who verbally acknowledged these results.   Electronically Signed   By: Bretta Bang III M.D.   On: 11/10/2014 19:00   11/10/2014   CLINICAL DATA:  Lower extremity edema and pain for 1 week  EXAM: BILATERAL LOWER EXTREMITY VENOUS DUPLEX ULTRASOUND  TECHNIQUE: Gray-scale sonography with graded compression, as well as color Doppler and duplex ultrasound were performed to evaluate the lower extremity deep venous systems from the level of the common femoral vein and including the common femoral, femoral, profunda femoral, popliteal and calf veins including the posterior tibial, peroneal and gastrocnemius veins when visible. The superficial great saphenous vein was also interrogated. Spectral Doppler was utilized to evaluate flow at rest and with distal augmentation maneuvers in the common femoral, femoral and popliteal veins.  COMPARISON:  Sep 15, 2011  FINDINGS: RIGHT LOWER EXTREMITY  Common Femoral Vein: No evidence of thrombus. Normal compressibility, respiratory phasicity and response to augmentation.  Saphenofemoral Junction: No evidence of thrombus. Normal compressibility and flow on color Doppler imaging.  Profunda Femoral Vein: No evidence of thrombus. Normal compressibility and flow on color Doppler imaging.  Femoral Vein: No evidence of thrombus. Normal compressibility, respiratory phasicity and response to augmentation.  Popliteal Vein: No evidence of thrombus. Normal compressibility, respiratory phasicity  and response to augmentation.  Calf Veins: No evidence of thrombus. Normal compressibility and flow on color Doppler imaging. Note that the peroneal vein is not well visualized due to soft tissue edema.  Superficial Great Saphenous Vein: No evidence of thrombus. Normal compressibility  and flow on color Doppler imaging.  Venous Reflux:  None.  Other Findings:  None.  LEFT LOWER EXTREMITY  Common Femoral Vein: No evidence of thrombus. Normal compressibility, respiratory phasicity and response to augmentation.  Saphenofemoral Junction: No evidence of thrombus. Normal compressibility and flow on color Doppler imaging.  Profunda Femoral Vein: No evidence of thrombus. Normal compressibility and flow on color Doppler imaging.  Femoral Vein: There is incompletely obstructing thrombus in the proximal left femoral vein. There is diminished compression and lack of augmentation focally in this area. The remainder of the left common femoral vein is somewhat diminutive without obvious thrombus.  Popliteal Vein: No evidence of thrombus. Normal compressibility, respiratory phasicity and response to augmentation.  Calf Veins: No evidence of thrombus. Normal compressibility and flow on color Doppler imaging. Peroneal vein not well visualized due to soft tissue edema.  Superficial Great Saphenous Vein: No evidence of thrombus. Normal compressibility and flow on color Doppler imaging.  Venous Reflux:  None.  Other Findings:  None.  IMPRESSION: Incompletely obstructing thrombus in the proximal left femoral vein. No other appreciable areas of deep venous thrombosis. Neither peroneal vein well seen due to soft tissue edema.  Electronically Signed: By: Bretta Bang III M.D. On: 11/10/2014 18:43   Dg Chest Port 1 View  11/12/2014   CLINICAL DATA:  Shortness of Breath  EXAM: PORTABLE CHEST - 1 VIEW  COMPARISON:  01/12/2014  FINDINGS: Cardiac shadow is within normal limits. Patchy infiltrate is noted in the medial left apex as well as in the right perihilar and infrahilar region. No sizable effusion is seen. No bony abnormality is noted.  IMPRESSION: Patchy infiltrates bilaterally. Followup PA and lateral chest X-ray is recommended in 3-4 weeks following trial of antibiotic therapy to ensure resolution and exclude  underlying malignancy.   Electronically Signed   By: Alcide Clever M.D.   On: 11/12/2014 07:47       Today   Subjective:   Tenzin Pavon  feeling much better but this still very weak  Objective:   Blood pressure 133/64, pulse 101, temperature 98.4 F (36.9 C), temperature source Oral, resp. rate 20, height  (1.626 m), weight 99.3 kg (218 lb 14.7 oz), SpO2 92 %.  .  Intake/Output Summary (Last 24 hours) at 11/22/14 0910 Last data filed at 11/22/14 0448  Gross per 24 hour  Intake     90 ml  Output      0 ml  Net     90 ml    Exam VITAL SIGNS: Blood pressure 133/64, pulse 101, temperature 98.4 F (36.9 C), temperature source Oral, resp. rate 20, height  (1.626 m), weight 99.3 kg (218 lb 14.7 oz), SpO2 92 %.  GENERAL:  69 y.o.-year-old patient lying in the bed with no acute distress.  EYES: Pupils equal, round, reactive to light and accommodation. No scleral icterus. Extraocular muscles intact.  HEENT: Head atraumatic, normocephalic. Oropharynx and nasopharynx clear.  NECK:  Supple, no jugular venous distention. No thyroid enlargement, no tenderness.  LUNGS: Normal breath sounds bilaterally, no wheezing, rales,rhonchi or crepitation. No use of accessory muscles of respiration.  CARDIOVASCULAR: S1, S2 normal. No murmurs, rubs, or gallops.  ABDOMEN: Soft, nontender, nondistended. Bowel sounds present.  No organomegaly or mass.  EXTREMITIES: No pedal edema, cyanosis, or clubbing.  NEUROLOGIC: Cranial nerves II through XII are intact. Muscle strength 5/5 in all extremities. Sensation intact. Gait not checked.  PSYCHIATRIC: The patient is alert and oriented x 3.  SKIN: No obvious rash, lesion, or ulcer.   Data Review     CBC w Diff: Lab Results  Component Value Date   WBC 9.8 11/22/2014   WBC 8.0 01/11/2014   HGB 8.5* 11/22/2014   HGB 9.0* 01/11/2014   HGB 9.0 06/02/2013   HCT 24.8* 11/22/2014   HCT 27.0* 01/11/2014   PLT 196 11/22/2014   PLT 149* 01/15/2014    LYMPHOPCT 13 11/20/2014   LYMPHOPCT 13.1 11/21/2013   MONOPCT 10 11/20/2014   MONOPCT 8.0 11/21/2013   EOSPCT 4 11/20/2014   EOSPCT 1.9 11/21/2013   BASOPCT 1 11/20/2014   BASOPCT 0.3 11/21/2013   CMP: Lab Results  Component Value Date   NA 133* 11/20/2014   NA 142 01/11/2014   NA 141 08/10/2011   K 5.6* 11/20/2014   K 3.7 01/11/2014   K 4.3 08/10/2011   CL 88* 11/20/2014   CL 109* 01/11/2014   CO2 32 11/20/2014   CO2 24 01/11/2014   BUN 83* 11/20/2014   BUN 42* 01/11/2014   CREATININE 6.87* 11/20/2014   CREATININE 6.17* 03/30/2014   CREATININE 6.27* 01/11/2014   PROT 5.9* 11/09/2014   PROT 6.9 01/11/2014   ALBUMIN 2.7* 11/20/2014   ALBUMIN 3.3* 01/11/2014   BILITOT 0.4 11/09/2014   BILITOT 0.2 01/11/2014   ALKPHOS 58 11/09/2014   ALKPHOS 70 01/11/2014   ALKPHOS 67 06/02/2013   AST 25 11/09/2014   AST 21 01/11/2014   ALT 18 11/09/2014   ALT 15 01/11/2014  .  Micro Results No results found for this or any previous visit (from the past 240 hour(s)).      Code Status Orders        Start     Ordered   11/12/14 1252  Do not attempt resuscitation (DNR)   Continuous    Question Answer Comment  In the event of cardiac or respiratory ARREST Do not call a "code blue"   In the event of cardiac or respiratory ARREST Do not perform Intubation, CPR, defibrillation or ACLS   In the event of cardiac or respiratory ARREST Use medication by any route, position, wound care, and other measures to relive pain and suffering. May use oxygen, suction and manual treatment of airway obstruction as needed for comfort.   Comments Nurse may pronounce      11/12/14 1251    Advance Directive Documentation        Most Recent Value   Type of Advance Directive  Living will   Pre-existing out of facility DNR order (yellow form or pink MOST form)     "MOST" Form in Place?            Follow-up Information    Follow up with md at snf In 5 days.      Follow up with hd as doing  previous.      Follow up with Dr. Sherrlyn Hock, oncology hemetology.   Why:  for h/o dvt, anemia if stable to resume coumadin      Discharge Medications     Medication List    STOP taking these medications        warfarin 2.5 MG tablet  Commonly known as:  COUMADIN      TAKE  these medications        acetaminophen 500 MG tablet  Commonly known as:  TYLENOL  Take 1,000 mg by mouth every 6 (six) hours as needed for mild pain or headache.     ALPRAZolam 1 MG tablet  Commonly known as:  XANAX  Take 1 tablet (1 mg total) by mouth 3 (three) times daily.     amoxicillin-clavulanate 250-125 MG per tablet  Commonly known as:  AUGMENTIN  Take 1 tablet by mouth 2 (two) times daily.     baclofen 10 MG tablet  Commonly known as:  LIORESAL  Take 5-10 mg by mouth 2 (two) times daily as needed for muscle spasms.     benzonatate 100 MG capsule  Commonly known as:  TESSALON  Take 1 capsule (100 mg total) by mouth every 6 (six) hours as needed for cough.     budesonide-formoterol 160-4.5 MCG/ACT inhaler  Commonly known as:  SYMBICORT  Inhale 2 puffs into the lungs 2 (two) times daily.     calcitRIOL 0.25 MCG capsule  Commonly known as:  ROCALTROL  Take 0.25 mcg by mouth daily.     calcium acetate 667 MG capsule  Commonly known as:  PHOSLO  Take 2,001 mg by mouth 3 (three) times daily with meals.     chlorpheniramine-HYDROcodone 10-8 MG/5ML Suer  Commonly known as:  TUSSIONEX  Take 5 mLs by mouth every 12 (twelve) hours as needed for cough.     cinacalcet 60 MG tablet  Commonly known as:  SENSIPAR  Take 60 mg by mouth daily.     citalopram 20 MG tablet  Commonly known as:  CELEXA  Take 1 tablet (20 mg total) by mouth daily.     cyanocobalamin 1000 MCG/ML injection  Commonly known as:  (VITAMIN B-12)  Inject 1,000 mcg into the muscle every 30 (thirty) days. Start 10/2014     docusate sodium 100 MG capsule  Commonly known as:  COLACE  Take 1 capsule (100 mg total) by mouth 2  (two) times daily.     donepezil 5 MG tablet  Commonly known as:  ARICEPT  Take 1 tablet (5 mg total) by mouth at bedtime.     feeding supplement (NEPRO CARB STEADY) Liqd  Take 237 mLs by mouth 2 (two) times daily between meals.     ferrous sulfate 325 (65 FE) MG tablet  Take 1 tablet (325 mg total) by mouth 2 (two) times daily with a meal.     hydrOXYzine 25 MG tablet  Commonly known as:  ATARAX/VISTARIL  Take 12.5-25 mg by mouth 2 (two) times daily as needed for anxiety.     lidocaine-prilocaine cream  Commonly known as:  EMLA  Apply 1 application topically as needed (topical anesthesia for hemodialysis if Gebauers and Lidocaine injection are ineffective.).     lovastatin 20 MG tablet  Commonly known as:  MEVACOR  Take 20 mg by mouth at bedtime.     midodrine 5 MG tablet  Commonly known as:  PROAMATINE  Take 1 tablet (5 mg total) by mouth 3 (three) times daily with meals.     multivitamin with minerals Tabs tablet  Take 1 tablet by mouth daily.     pantoprazole 40 MG tablet  Commonly known as:  PROTONIX  Take 40 mg by mouth daily as needed (for heartburn/indigestion).     pravastatin 20 MG tablet  Commonly known as:  PRAVACHOL  Take 1 tablet (20 mg total) by mouth daily at  6 PM.     promethazine 12.5 MG tablet  Commonly known as:  PHENERGAN  Take 12.5 mg by mouth every 6 (six) hours as needed for nausea or vomiting.     traMADol 50 MG tablet  Commonly known as:  ULTRAM  Take 1 tablet (50 mg total) by mouth every 6 (six) hours as needed for moderate pain.     traZODone 50 MG tablet  Commonly known as:  DESYREL  Take 0.5-1 tablets (25-50 mg total) by mouth at bedtime.     VENTOLIN HFA 108 (90 BASE) MCG/ACT inhaler  Generic drug:  albuterol  Inhale 2 puffs into the lungs every 6 (six) hours as needed for wheezing or shortness of breath.     zolpidem 5 MG tablet  Commonly known as:  AMBIEN  Take 1 tablet (5 mg total) by mouth at bedtime as needed for sleep.            Total Time in preparing paper work, data evaluation and todays exam - 35 minutes  Auburn Bilberry M.D on 11/22/2014 at 9:10 AM  [facility]   Office  [phone]

## 2014-11-22 NOTE — Progress Notes (Signed)
Physical Therapy Treatment Patient Details Name: Ariel Smith MRN: [id number] DOB: 1945/08/23 Today's Date: 11/22/2014    History of Present Illness presented to ER with progrsesive weakness, black/tarry stools; admitted with acute hemorrhagic anemia related to upper GIB.  Hosital course significant for EGD showing no active bleed; L LE DVT s/p IVC filter placed (7/25), unable to anticoagulate secondary to GIB; respiratory distress related to bilat PNA, now on 2-3L supplemental O2 via [place].    PT Comments    Pt progressing slowly towards all goals. Stand activities/ambulation significantly limited at this time due to SOB, fatigue and weakness. Pt requires safety cues for Sit to stand transfers. Pt complains of RLE weakness that is noted with stand on RLE during LLE stand exercises even with BUE support on rolling walker. Pt would benefit from skilled PT for progression of safe transfers, increased distance and quality of ambulation and well as endurance with stand activities to allow for safe functional mobility.   Follow Up Recommendations  SNF     Equipment Recommendations  Rolling walker with 5" wheels    Recommendations for Other Services       Precautions / Restrictions Precautions Precautions: Fall Restrictions Weight Bearing Restrictions: No    Mobility  Bed Mobility Overal bed mobility: Modified Independent Bed Mobility: Supine to Sit     Supine to sit: Modified independent (Device/Increase time)     General bed mobility comments: cues for repositioning upward in bed with use of rails and LEs  Transfers Overall transfer level: Needs assistance Equipment used: Rolling walker (2 wheeled) Transfers: Sit to/from Stand Sit to Stand: Min guard (cues for safe hand placement)            Ambulation/Gait Ambulation/Gait assistance: Min guard Ambulation Distance (Feet): 22 Feet Assistive device: Rolling walker (2 wheeled) Gait Pattern/deviations: Step-through  pattern Gait velocity: reduced Gait velocity interpretation: Below normal speed for age/gender General Gait Details: limited due to SOB and fatigue. mildly unsteady   Information systems manager [place] (Stroke Patients Only)       Balance             Standing balance-Leahy Scale: Poor                      Cognition Arousal/Alertness: Awake/alert (Fatigued) Behavior During Therapy: WFL for tasks assessed/performed Overall Cognitive Status: Within Functional Limits for tasks assessed                      Exercises General Exercises - Lower Extremity Hip ABduction/ADduction: AROM;Both;10 reps;Standing Straight Leg Raises: AROM;Both;10 reps;Standing Hip Flexion/Marching: AROM;Both;10 reps;Standing Heel Raises: Strengthening;Both;10 reps;Standing    General Comments        Pertinent Vitals/Pain Pain Assessment: No/denies pain    Home Living                      Prior Function            PT Goals (current goals can now be found in the care plan section) Progress towards PT goals: Progressing toward goals (slowly)    Frequency  Min 2X/week    PT Plan Current plan remains appropriate    Co-evaluation             End of Session Equipment Utilized During Treatment: Gait belt Activity Tolerance: Patient limited by fatigue (limited by SOB) Patient left:  in bed;with call bell/phone within reach;with bed alarm set;with family/visitor present     Time: 1191-4782 PT Time Calculation (min) (ACUTE ONLY): 24 min  Charges:  $Gait Training: 8-22 mins $Therapeutic Exercise: 8-22 mins                    G Codes:      Kristeen Miss 11/22/2014, 2:06 PM

## 2014-11-22 NOTE — Clinical Social Work Placement (Signed)
   CLINICAL SOCIAL WORK PLACEMENT  NOTE  Date:  11/22/2014  Patient Details  Name: Ariel Smith MRN: [id number] Date of Birth: 07-01-1945  Clinical Social Work is seeking post-discharge placement for this patient at the Skilled  Nursing Facility level of care (*CSW will initial, date and re-position this form in  chart as items are completed):  Yes   Patient/family provided with [facility] Clinical Social Work Department's list of facilities offering this level of care within the geographic area requested by the patient (or if unable, by the patient's family).  Yes   Patient/family informed of their freedom to choose among providers that offer the needed level of care, that participate in Medicare, Medicaid or managed care program needed by the patient, have an available bed and are willing to accept the patient.  Yes   Patient/family informed of [facility]'s ownership interest in [facility] and [facility], as well as of the fact that they are under no obligation to receive care at these facilities.  PASRR submitted to EDS on 11/21/14     PASRR number received on 11/21/14     Existing PASRR number confirmed on       FL2 transmitted to all facilities in geographic area requested by pt/family on 11/21/14     FL2 transmitted to all facilities within larger geographic area on       Patient informed that his/her managed care company has contracts with or will negotiate with certain facilities, including the following:   (pt and her husband given SNF packet with this information on it.)     Yes   Patient/family informed of bed offers received.  Patient chooses bed at [facility]     Physician recommends and patient chooses bed at      Patient to be transferred to [facility] on 11/22/14.  Patient to be transferred to facility by EMS     Patient family notified on 11/22/14 of transfer.  Name of family member notified:  Husband [facility]      PHYSICIAN       Additional Comment:    _______________________________________________ Chauncy Passy, LCSW 11/22/2014, 9:16 AM

## 2014-11-22 NOTE — Progress Notes (Signed)
POST HD 

## 2014-11-22 NOTE — Progress Notes (Signed)
Report called to receiving RN, Chrissy at [facility].  Pt in NAD, skin warm and dry, respirations even and unlabored.  Pt denies any pain or discomfort at this time.  IV and telemetry discontinued per policy and procedure. Awaiting EMS transport.

## 2014-11-22 NOTE — Progress Notes (Signed)
[facility]  ROUNDING NOTE   Subjective:   Seen and examined on hemodialysis. Tolerating treatment well. UF goal of 2 litres.  Breathing comfortably. Sitting in chair.    Objective:  Vital signs in last 24 hours:  Temp:  [97.9 F (36.6 C)-98.4 F (36.9 C)] 98.4 F (36.9 C) (08/04 0554) Pulse Rate:  [88-101] 101 (08/04 0554) Resp:  [17-20] 20 (08/04 0554) BP: (111-134)/(49-64) 133/64 mmHg (08/04 0554) SpO2:  [90 %-94 %] 92 % (08/04 0554)  Weight change:  Filed Weights   11/17/14 1010 11/20/14 0930 11/20/14 1315  Weight: 103.8 kg (228 lb 13.4 oz) 99.7 kg (219 lb 12.8 oz) 99.3 kg (218 lb 14.7 oz)    Intake/Output: I/O last 3 completed shifts: In: 90 [P.O.:50; I.V.:40] Out: 0    Intake/Output this shift:     Physical Exam: General: Resting comfortably  Head: Normocephalic, atraumatic.  Eyes: Anicteric  Neck: Supple, trachea midline  Lungs:  Diminished, 2 Litres [place]  Heart: Regular, +murmur  Abdomen:  Soft, nontender, BS present  Extremities:  no peripheral edema.  Neurologic: Nonfocal, moving all four extremities  Skin: No lesions  Access: LUE AVF    Basic Metabolic Panel:  Recent Labs Lab 11/16/14 1224 11/17/14 0540 11/18/14 0529 11/19/14 0450 11/20/14 0518  NA 142 141 138 135 133*  K 4.3 4.0 4.0 4.8 5.6*  CL 99* 98* 94* 90* 88*  CO2 33* 33* 34* 32 32  GLUCOSE 115* 97 99 110* 94  BUN 38* 30* 30* 60* 83*  CREATININE 4.03* 3.40* 3.77* 5.32* 6.87*  CALCIUM 8.3* 8.1* 8.1* 8.2* 8.4*  PHOS 1.8*  --   --   --  2.4*    Liver Function Tests:  Recent Labs Lab 11/16/14 1224 11/20/14 0518  ALBUMIN 2.8* 2.7*   No results for input(s): LIPASE, AMYLASE in the last 168 hours. No results for input(s): AMMONIA in the last 168 hours.  CBC:  Recent Labs Lab 11/19/14 0450 11/20/14 0518 11/20/14 1043 11/21/14 0425 11/22/14 0437  WBC 9.1 9.1 10.1 10.0 9.8  NEUTROABS  --  6.6*  --   --   --   HGB 7.8* 7.5* 7.9* 8.7* 8.5*  HCT 23.3* 22.5*  24.1* 25.9* 24.8*  MCV 97.3 98.0 97.8 96.9 98.4  PLT 210 230 241 224 196    Cardiac Enzymes: No results for input(s): CKTOTAL, CKMB, CKMBINDEX, TROPONINI in the last 168 hours.  BNP: Invalid input(s): POCBNP  CBG: No results for input(s): GLUCAP in the last 168 hours.  Microbiology: Results for orders placed or performed during the hospital encounter of 11/09/14  MRSA PCR Screening     Status: None   Collection Time: 11/09/14  9:40 PM  Result Value Ref Range Status   MRSA by PCR NEGATIVE NEGATIVE Final    Comment:        The GeneXpert MRSA Assay (FDA approved for NASAL specimens only), is one component of a comprehensive MRSA colonization surveillance program. It is not intended to diagnose MRSA infection nor to guide or monitor treatment for MRSA infections.     Coagulation Studies: No results for input(s): LABPROT, INR in the last 72 hours.  Urinalysis: No results for input(s): COLORURINE, LABSPEC, PHURINE, GLUCOSEU, HGBUR, BILIRUBINUR, KETONESUR, PROTEINUR, UROBILINOGEN, NITRITE, LEUKOCYTESUR in the last 72 hours.  Invalid input(s): APPERANCEUR    Imaging: No results found.   Medications:     . ALPRAZolam  1 mg Oral TID  . amoxicillin-clavulanate  1 tablet Oral Q24H  .  budesonide (PULMICORT) nebulizer solution  0.25 mg Nebulization BID  . citalopram  20 mg Oral Daily  . docusate sodium  100 mg Oral BID  . donepezil  5 mg Oral QHS  . epoetin (EPOGEN/PROCRIT) injection  10,000 Units Intravenous Once  . feeding supplement (NEPRO CARB STEADY)  237 mL Oral BID BM  . ferrous sulfate  325 mg Oral BID WC  . ipratropium-albuterol  3 mL Nebulization TID  . midodrine  5 mg Oral TID WC  . multivitamin with minerals  1 tablet Oral Daily  . pantoprazole  40 mg Oral BID  . pravastatin  20 mg Oral q1800  . sevelamer carbonate  1,600 mg Oral TID WC  . sodium chloride  10-40 mL Intracatheter Q12H  . traZODone  25-50 mg Oral QHS  . vancomycin  750 mg Intravenous Q  T,Th,Sa-HD   sodium chloride, sodium chloride, albuterol, benzonatate, chlorpheniramine-HYDROcodone, hydrOXYzine, lidocaine (PF), lidocaine-prilocaine, menthol-cetylpyridinium, pentafluoroprop-tetrafluoroeth, sevelamer carbonate, sodium chloride, traMADol, traMADol, zolpidem  Assessment/ Plan:  69 y.o. female with past medical history of ESRD on HD TTHS, SHPTH, AOCD, HTN, hyperlipidemia, COPD, DVT with PE, tx with long term coumadin and IVC filter placement, L knee TKA, R shoulder surgery  [facility] TTS [address] [facility]  1. End-stage renal disease on hemodialysis: three days of hemodialysis. UF of 15.5 litres total.  Monitor daily for dialysis need.  No acute indication for dialysis.  - Continue TTS schedule.  - midodrine before treatment  2. Anemia of chronic kidney disease/anemia of blood loss. Hemoglobin 8.5. Secondary to GI bleed. No recent bleeding - status post transfusion PRBC on 7/22 and 8/2 - epo with HD.   3. Secondary hyperparathyroidism. PTH 142 (goal 150-600). Phos 2.4 - discontinued calcitriol and cinacalcet.  - Continue renvela, dose reduced  4. Actue DVT: with history of DVT. Status post IVC placed 7/25 [name].  - was on warfarin in the past.  - currently not on anticoagulation due to active GI bleed. Will need something like Eliquis on discharge. Failed outpatient warfarin. Will wait for GI to clear for clearance for anticoagulation.   5. Hypotension: started on midodrine before HD treatments. Started 5mg  daily. Give 30-45 minutes before dialysis treatment.    LOS: 13 Uzziah Rigg 8/4/201610:00 AM

## 2014-12-03 ENCOUNTER — Inpatient Hospital Stay: Payer: Medicare Other | Attending: Internal Medicine | Admitting: Internal Medicine

## 2014-12-11 ENCOUNTER — Emergency Department: Payer: Medicare Other

## 2014-12-11 ENCOUNTER — Inpatient Hospital Stay
Admission: EM | Admit: 2014-12-11 | Discharge: 2014-12-14 | DRG: 193 | Disposition: A | Discharge status: Home / Self Care | Payer: Medicare Other | Attending: Internal Medicine | Admitting: Internal Medicine

## 2014-12-11 ENCOUNTER — Encounter: Payer: Self-pay | Admitting: *Deleted

## 2014-12-11 DIAGNOSIS — F329 Major depressive disorder, single episode, unspecified: Secondary | ICD-10-CM | POA: Diagnosis present

## 2014-12-11 DIAGNOSIS — N2581 Secondary hyperparathyroidism of renal origin: Secondary | ICD-10-CM | POA: Diagnosis present

## 2014-12-11 DIAGNOSIS — J441 Chronic obstructive pulmonary disease with (acute) exacerbation: Secondary | ICD-10-CM | POA: Diagnosis present

## 2014-12-11 DIAGNOSIS — Z87891 Personal history of nicotine dependence: Secondary | ICD-10-CM

## 2014-12-11 DIAGNOSIS — I120 Hypertensive chronic kidney disease with stage 5 chronic kidney disease or end stage renal disease: Secondary | ICD-10-CM | POA: Diagnosis present

## 2014-12-11 DIAGNOSIS — Z8042 Family history of malignant neoplasm of prostate: Secondary | ICD-10-CM

## 2014-12-11 DIAGNOSIS — I10 Essential (primary) hypertension: Secondary | ICD-10-CM | POA: Diagnosis present

## 2014-12-11 DIAGNOSIS — Z9889 Other specified postprocedural states: Secondary | ICD-10-CM

## 2014-12-11 DIAGNOSIS — Z888 Allergy status to other drugs, medicaments and biological substances status: Secondary | ICD-10-CM | POA: Diagnosis not present

## 2014-12-11 DIAGNOSIS — Z820 Family history of epilepsy and other diseases of the nervous system: Secondary | ICD-10-CM | POA: Diagnosis not present

## 2014-12-11 DIAGNOSIS — E785 Hyperlipidemia, unspecified: Secondary | ICD-10-CM | POA: Diagnosis present

## 2014-12-11 DIAGNOSIS — N186 End stage renal disease: Secondary | ICD-10-CM

## 2014-12-11 DIAGNOSIS — K219 Gastro-esophageal reflux disease without esophagitis: Secondary | ICD-10-CM | POA: Diagnosis present

## 2014-12-11 DIAGNOSIS — J189 Pneumonia, unspecified organism: Secondary | ICD-10-CM | POA: Diagnosis present

## 2014-12-11 DIAGNOSIS — Z86718 Personal history of other venous thrombosis and embolism: Secondary | ICD-10-CM

## 2014-12-11 DIAGNOSIS — Y95 Nosocomial condition: Secondary | ICD-10-CM | POA: Diagnosis present

## 2014-12-11 DIAGNOSIS — Z7901 Long term (current) use of anticoagulants: Secondary | ICD-10-CM | POA: Diagnosis not present

## 2014-12-11 DIAGNOSIS — J9621 Acute and chronic respiratory failure with hypoxia: Secondary | ICD-10-CM | POA: Diagnosis present

## 2014-12-11 DIAGNOSIS — Z66 Do not resuscitate: Secondary | ICD-10-CM | POA: Diagnosis present

## 2014-12-11 DIAGNOSIS — Z800 Family history of malignant neoplasm of digestive organs: Secondary | ICD-10-CM | POA: Diagnosis not present

## 2014-12-11 DIAGNOSIS — D631 Anemia in chronic kidney disease: Secondary | ICD-10-CM | POA: Diagnosis present

## 2014-12-11 DIAGNOSIS — Z992 Dependence on renal dialysis: Secondary | ICD-10-CM | POA: Diagnosis not present

## 2014-12-11 DIAGNOSIS — Z9851 Tubal ligation status: Secondary | ICD-10-CM | POA: Diagnosis not present

## 2014-12-11 DIAGNOSIS — Z86010 Personal history of colonic polyps: Secondary | ICD-10-CM

## 2014-12-11 DIAGNOSIS — M1990 Unspecified osteoarthritis, unspecified site: Secondary | ICD-10-CM | POA: Diagnosis present

## 2014-12-11 DIAGNOSIS — Z9981 Dependence on supplemental oxygen: Secondary | ICD-10-CM | POA: Diagnosis not present

## 2014-12-11 DIAGNOSIS — Z823 Family history of stroke: Secondary | ICD-10-CM | POA: Diagnosis not present

## 2014-12-11 DIAGNOSIS — E875 Hyperkalemia: Secondary | ICD-10-CM | POA: Diagnosis present

## 2014-12-11 DIAGNOSIS — R0602 Shortness of breath: Secondary | ICD-10-CM

## 2014-12-11 DIAGNOSIS — Z9049 Acquired absence of other specified parts of digestive tract: Secondary | ICD-10-CM | POA: Diagnosis present

## 2014-12-11 DIAGNOSIS — Z803 Family history of malignant neoplasm of breast: Secondary | ICD-10-CM

## 2014-12-11 DIAGNOSIS — Z86711 Personal history of pulmonary embolism: Secondary | ICD-10-CM | POA: Diagnosis not present

## 2014-12-11 DIAGNOSIS — I350 Nonrheumatic aortic (valve) stenosis: Secondary | ICD-10-CM | POA: Diagnosis present

## 2014-12-11 DIAGNOSIS — Z79899 Other long term (current) drug therapy: Secondary | ICD-10-CM | POA: Diagnosis not present

## 2014-12-11 DIAGNOSIS — Z96651 Presence of right artificial knee joint: Secondary | ICD-10-CM | POA: Diagnosis present

## 2014-12-11 DIAGNOSIS — Z8249 Family history of ischemic heart disease and other diseases of the circulatory system: Secondary | ICD-10-CM

## 2014-12-11 LAB — BASIC METABOLIC PANEL
ANION GAP: 15 (ref 5–15)
ANION GAP: 17 — AB (ref 5–15)
BUN: 58 mg/dL — ABNORMAL HIGH (ref 6–20)
BUN: 69 mg/dL — AB (ref 6–20)
CHLORIDE: 98 mmol/L — AB (ref 101–111)
CO2: 25 mmol/L (ref 22–32)
CO2: 25 mmol/L (ref 22–32)
CREATININE: 8.08 mg/dL — AB (ref 0.44–1.00)
Calcium: 8.7 mg/dL — ABNORMAL LOW (ref 8.9–10.3)
Calcium: 8.4 mg/dL — ABNORMAL LOW (ref 8.9–10.3)
Chloride: 97 mmol/L — ABNORMAL LOW (ref 101–111)
Creatinine, Ser: 9.04 mg/dL — ABNORMAL HIGH (ref 0.44–1.00)
GFR calc Af Amer: 5 mL/min — ABNORMAL LOW (ref >60)
GFR calc non Af Amer: 5 mL/min — ABNORMAL LOW (ref >60)
GFR, EST AFRICAN AMERICAN: 5 mL/min — AB (ref >60)
GFR, EST NON AFRICAN AMERICAN: 4 mL/min — AB (ref >60)
GLUCOSE: 144 mg/dL — AB (ref 65–99)
Glucose, Bld: 107 mg/dL — ABNORMAL HIGH (ref 65–99)
POTASSIUM: 4.8 mmol/L (ref 3.5–5.1)
POTASSIUM: 5.3 mmol/L — AB (ref 3.5–5.1)
SODIUM: 138 mmol/L (ref 135–145)
Sodium: 139 mmol/L (ref 135–145)

## 2014-12-11 LAB — BLOOD GAS, ARTERIAL
ACID-BASE EXCESS: 2 mmol/L (ref 0.0–3.0)
ALLENS TEST (PASS/FAIL): POSITIVE — AB
BICARBONATE: 26.5 meq/L (ref 21.0–28.0)
DELIVERY SYSTEMS: POSITIVE
Expiratory PAP: 5
FIO2: 0.4
Inspiratory PAP: 15
O2 SAT: 97 %
PATIENT TEMPERATURE: 37
PH ART: 7.43 (ref 7.350–7.450)
pCO2 arterial: 40 mmHg (ref 32.0–48.0)
pO2, Arterial: 88 mmHg (ref 83.0–108.0)

## 2014-12-11 LAB — CBC
HEMATOCRIT: 24.8 % — AB (ref 35.0–47.0)
HEMATOCRIT: 24.2 % — AB (ref 35.0–47.0)
HEMOGLOBIN: 8.2 g/dL — AB (ref 12.0–16.0)
HEMOGLOBIN: 8.1 g/dL — AB (ref 12.0–16.0)
MCH: 33.4 pg (ref 26.0–34.0)
MCH: 33.9 pg (ref 26.0–34.0)
MCHC: 33.1 g/dL (ref 32.0–36.0)
MCHC: 33.4 g/dL (ref 32.0–36.0)
MCV: 100.9 fL — AB (ref 80.0–100.0)
MCV: 101.5 fL — ABNORMAL HIGH (ref 80.0–100.0)
Platelets: 251 10*3/uL (ref 150–440)
Platelets: 207 10*3/uL (ref 150–440)
RBC: 2.46 MIL/uL — AB (ref 3.80–5.20)
RBC: 2.38 MIL/uL — ABNORMAL LOW (ref 3.80–5.20)
RDW: 17.5 % — ABNORMAL HIGH (ref 11.5–14.5)
RDW: 17.9 % — AB (ref 11.5–14.5)
WBC: 8.7 10*3/uL (ref 3.6–11.0)
WBC: 7.3 10*3/uL (ref 3.6–11.0)

## 2014-12-11 LAB — PHOSPHORUS: PHOSPHORUS: 3.8 mg/dL (ref 2.5–4.6)

## 2014-12-11 LAB — TROPONIN I: Troponin I: <0.03 ng/mL (ref <0.031)

## 2014-12-11 MED ORDER — BUDESONIDE-FORMOTEROL FUMARATE 160-4.5 MCG/ACT IN AERO
2.0000 | INHALATION_SPRAY | Freq: Two times a day (BID) | RESPIRATORY_TRACT | Status: DC
  Administered 2014-12-11 – 2014-12-14 (×5): 2 via RESPIRATORY_TRACT
  Filled 2014-12-11 (×2): qty 6

## 2014-12-11 MED ORDER — CINACALCET HCL 30 MG PO TABS
60.0000 mg | ORAL_TABLET | Freq: Every day | ORAL | Status: DC
  Administered 2014-12-12 – 2014-12-14 (×2): 60 mg via ORAL
  Filled 2014-12-11 (×4): qty 2

## 2014-12-11 MED ORDER — METHYLPREDNISOLONE SODIUM SUCC 40 MG IJ SOLR
40.0000 mg | Freq: Four times a day (QID) | INTRAMUSCULAR | Status: DC
  Administered 2014-12-11 – 2014-12-12 (×5): 40 mg via INTRAVENOUS
  Filled 2014-12-11 (×5): qty 1

## 2014-12-11 MED ORDER — MIDODRINE HCL 5 MG PO TABS
5.0000 mg | ORAL_TABLET | Freq: Three times a day (TID) | ORAL | Status: DC
  Administered 2014-12-12 – 2014-12-14 (×6): 5 mg via ORAL
  Filled 2014-12-11 (×6): qty 1

## 2014-12-11 MED ORDER — BENZONATATE 100 MG PO CAPS
100.0000 mg | ORAL_CAPSULE | Freq: Three times a day (TID) | ORAL | Status: DC | PRN
  Administered 2014-12-12: 100 mg via ORAL
  Filled 2014-12-11 (×2): qty 1

## 2014-12-11 MED ORDER — VANCOMYCIN HCL IN DEXTROSE 1-5 GM/200ML-% IV SOLN
1000.0000 mg | Freq: Once | INTRAVENOUS | Status: AC
  Administered 2014-12-11: 1000 mg via INTRAVENOUS
  Filled 2014-12-11: qty 200

## 2014-12-11 MED ORDER — ONDANSETRON HCL 4 MG PO TABS: 4.0000 mg | ORAL_TABLET | Freq: Four times a day (QID) | ORAL | Status: DC | PRN

## 2014-12-11 MED ORDER — ACETAMINOPHEN 325 MG PO TABS
650.0000 mg | ORAL_TABLET | Freq: Four times a day (QID) | ORAL | Status: DC | PRN
  Administered 2014-12-11 – 2014-12-12 (×2): 650 mg via ORAL
  Filled 2014-12-11: qty 2

## 2014-12-11 MED ORDER — FERROUS SULFATE 325 (65 FE) MG PO TABS
325.0000 mg | ORAL_TABLET | Freq: Two times a day (BID) | ORAL | Status: DC
  Administered 2014-12-12 – 2014-12-14 (×4): 325 mg via ORAL
  Filled 2014-12-11 (×4): qty 1

## 2014-12-11 MED ORDER — CALCITRIOL 0.25 MCG PO CAPS
0.2500 ug | ORAL_CAPSULE | Freq: Every day | ORAL | Status: DC
Start: 2014-12-11 — End: 2014-12-14
  Administered 2014-12-11 – 2014-12-14 (×4): 0.25 ug via ORAL
  Filled 2014-12-11 (×4): qty 1

## 2014-12-11 MED ORDER — DONEPEZIL HCL 5 MG PO TABS
5.0000 mg | ORAL_TABLET | Freq: Every day | ORAL | Status: DC
  Administered 2014-12-11 – 2014-12-13 (×3): 5 mg via ORAL
  Filled 2014-12-11 (×3): qty 1

## 2014-12-11 MED ORDER — TRAZODONE HCL 50 MG PO TABS
25.0000 mg | ORAL_TABLET | Freq: Every day | ORAL | Status: DC
  Administered 2014-12-11 – 2014-12-13 (×3): 50 mg via ORAL
  Filled 2014-12-11: qty 1
  Filled 2014-12-11: qty 2
  Filled 2014-12-11: qty 1

## 2014-12-11 MED ORDER — ZOLPIDEM TARTRATE 5 MG PO TABS
5.0000 mg | ORAL_TABLET | Freq: Every evening | ORAL | Status: DC | PRN
  Administered 2014-12-13 (×2): 5 mg via ORAL
  Filled 2014-12-11 (×2): qty 1

## 2014-12-11 MED ORDER — ONDANSETRON HCL 4 MG/2ML IJ SOLN: 4.0000 mg | Freq: Four times a day (QID) | INTRAMUSCULAR | Status: DC | PRN

## 2014-12-11 MED ORDER — PRAVASTATIN SODIUM 20 MG PO TABS
20.0000 mg | ORAL_TABLET | Freq: Every day | ORAL | Status: DC
  Administered 2014-12-12 – 2014-12-13 (×2): 20 mg via ORAL
  Filled 2014-12-11 (×2): qty 1

## 2014-12-11 MED ORDER — IPRATROPIUM-ALBUTEROL 0.5-2.5 (3) MG/3ML IN SOLN
3.0000 mL | Freq: Four times a day (QID) | RESPIRATORY_TRACT | Status: DC
  Administered 2014-12-11 – 2014-12-14 (×11): 3 mL via RESPIRATORY_TRACT
  Filled 2014-12-11 (×9): qty 3

## 2014-12-11 MED ORDER — BACLOFEN 10 MG PO TABS: 5.0000 mg | ORAL_TABLET | Freq: Two times a day (BID) | ORAL | Status: DC | PRN

## 2014-12-11 MED ORDER — VANCOMYCIN HCL IN DEXTROSE 750-5 MG/150ML-% IV SOLN
750.0000 mg | INTRAVENOUS | Status: DC
  Administered 2014-12-11 – 2014-12-13 (×2): 750 mg via INTRAVENOUS
  Filled 2014-12-11 (×6): qty 150

## 2014-12-11 MED ORDER — VANCOMYCIN HCL 500 MG IV SOLR
500.0000 mg | INTRAVENOUS | Status: AC
Start: 2014-12-11 — End: 2014-12-11
  Administered 2014-12-11: 500 mg via INTRAVENOUS
  Filled 2014-12-11 (×2): qty 500

## 2014-12-11 MED ORDER — PIPERACILLIN-TAZOBACTAM 3.375 G IVPB
3.3750 g | Freq: Once | INTRAVENOUS | Status: AC
  Administered 2014-12-11: 3.375 g via INTRAVENOUS
  Filled 2014-12-11: qty 50

## 2014-12-11 MED ORDER — ALBUTEROL SULFATE (2.5 MG/3ML) 0.083% IN NEBU: 2.5000 mg | INHALATION_SOLUTION | Freq: Once | RESPIRATORY_TRACT | Status: DC

## 2014-12-11 MED ORDER — PIPERACILLIN-TAZOBACTAM 3.375 G IVPB
3.3750 g | Freq: Two times a day (BID) | INTRAVENOUS | Status: DC
  Administered 2014-12-11 – 2014-12-14 (×6): 3.375 g via INTRAVENOUS
  Filled 2014-12-11 (×10): qty 50

## 2014-12-11 MED ORDER — LIDOCAINE-PRILOCAINE 2.5-2.5 % EX CREA: TOPICAL_CREAM | CUTANEOUS | Status: DC

## 2014-12-11 MED ORDER — CITALOPRAM HYDROBROMIDE 20 MG PO TABS
20.0000 mg | ORAL_TABLET | Freq: Every day | ORAL | Status: DC
  Administered 2014-12-11 – 2014-12-14 (×4): 20 mg via ORAL
  Filled 2014-12-11 (×4): qty 1

## 2014-12-11 MED ORDER — ACETAMINOPHEN 650 MG RE SUPP: 650.0000 mg | Freq: Four times a day (QID) | RECTAL | Status: DC | PRN

## 2014-12-11 MED ORDER — HYDROCOD POLST-CPM POLST ER 10-8 MG/5ML PO SUER
5.0000 mL | Freq: Two times a day (BID) | ORAL | Status: DC | PRN
  Administered 2014-12-12 – 2014-12-14 (×4): 5 mL via ORAL
  Filled 2014-12-11 (×4): qty 5

## 2014-12-11 MED ORDER — SODIUM CHLORIDE 0.9 % IJ SOLN
3.0000 mL | Freq: Two times a day (BID) | INTRAMUSCULAR | Status: DC
  Administered 2014-12-11 – 2014-12-14 (×7): 3 mL via INTRAVENOUS

## 2014-12-11 MED ORDER — EPOETIN ALFA 10000 UNIT/ML IJ SOLN
10000.0000 [IU] | INTRAMUSCULAR | Status: DC
  Administered 2014-12-13: 10000 [IU] via INTRAVENOUS

## 2014-12-11 MED ORDER — IPRATROPIUM-ALBUTEROL 0.5-2.5 (3) MG/3ML IN SOLN
3.0000 mL | Freq: Once | RESPIRATORY_TRACT | Status: AC
  Administered 2014-12-11: 3 mL via RESPIRATORY_TRACT

## 2014-12-11 MED ORDER — PANTOPRAZOLE SODIUM 40 MG PO TBEC: 40.0000 mg | DELAYED_RELEASE_TABLET | Freq: Every day | ORAL | Status: DC | PRN

## 2014-12-11 MED ORDER — IPRATROPIUM-ALBUTEROL 0.5-2.5 (3) MG/3ML IN SOLN
3.0000 mL | Freq: Once | RESPIRATORY_TRACT | Status: DC
  Filled 2014-12-11: qty 3

## 2014-12-11 MED ORDER — DOCUSATE SODIUM 100 MG PO CAPS
100.0000 mg | ORAL_CAPSULE | Freq: Two times a day (BID) | ORAL | Status: DC
  Administered 2014-12-11 – 2014-12-14 (×7): 100 mg via ORAL
  Filled 2014-12-11 (×7): qty 1

## 2014-12-11 MED ORDER — ADULT MULTIVITAMIN W/MINERALS CH
1.0000 | ORAL_TABLET | Freq: Every day | ORAL | Status: DC
  Administered 2014-12-11 – 2014-12-14 (×4): 1 via ORAL
  Filled 2014-12-11 (×4): qty 1

## 2014-12-11 MED ORDER — ALPRAZOLAM 1 MG PO TABS
1.0000 mg | ORAL_TABLET | Freq: Three times a day (TID) | ORAL | Status: DC
  Administered 2014-12-11 – 2014-12-14 (×8): 1 mg via ORAL
  Filled 2014-12-11 (×8): qty 1

## 2014-12-11 MED ORDER — ALBUTEROL SULFATE (2.5 MG/3ML) 0.083% IN NEBU
5.0000 mg | INHALATION_SOLUTION | Freq: Once | RESPIRATORY_TRACT | Status: AC
  Administered 2014-12-11: 5 mg via RESPIRATORY_TRACT

## 2014-12-11 MED ORDER — HYDROXYZINE HCL 25 MG PO TABS
12.5000 mg | ORAL_TABLET | Freq: Two times a day (BID) | ORAL | Status: DC | PRN
  Filled 2014-12-11: qty 1

## 2014-12-11 MED ORDER — NEPRO/CARBSTEADY PO LIQD
237.0000 mL | Freq: Two times a day (BID) | ORAL | Status: DC
  Administered 2014-12-12 – 2014-12-14 (×3): 237 mL via ORAL

## 2014-12-11 MED ORDER — CALCIUM ACETATE 667 MG PO CAPS
2001.0000 mg | ORAL_CAPSULE | Freq: Three times a day (TID) | ORAL | Status: DC
  Administered 2014-12-12 – 2014-12-14 (×6): 2001 mg via ORAL
  Filled 2014-12-11 (×16): qty 3

## 2014-12-11 NOTE — Progress Notes (Signed)
Subjective:  Patient is well known to our practice from outpatient. She is followed for outpatient dialysis. She is admitted for worsening shortness of breath and cough. Chest x-ray shows pneumonia. She is admitted for further evaluation and management. Overnight, she required BiPAP for respiratory support. Nephrology consult has been requested to evaluate for dialysis. Patient currently does report some dyspnea and productive cough.   Objective:  Vital signs in last 24 hours:  Temp:  [98.5 F (36.9 C)] 98.5 F (36.9 C) (08/23 1406) Pulse Rate:  [92-116] 96 (08/23 1406) Resp:  [10-22] 18 (08/23 1406) BP: (90-145)/(42-81) 137/73 mmHg (08/23 1406) SpO2:  [63 %-99 %] 94 % (08/23 1406) FiO2 (%):  [40 %] 40 % (08/23 0620) Weight:  [98.431 kg (217 lb)] 98.431 kg (217 lb) (08/23 0200)  Weight change:  Filed Weights   12/11/14 0200  Weight: 98.431 kg (217 lb)    Intake/Output:   No intake or output data in the 24 hours ending 12/11/14 1454   Physical Exam: General:  no acute distress,   HEENT  anicteric, moist mucous membranes   Neck  supple, no masses   Pulm/lungs  right basilar crackles, oxygen by nasal cannula   CVS/Heart  irregular rhythm, no rub or gallop   Abdomen:   soft, nontender, nondistended   Extremities:  no peripheral edema   Neurologic:  alert, oriented, speech normal, nonfocal   Skin:  no acute rashes   Access:  AV fistula        Basic Metabolic Panel:   Recent Labs Lab 12/11/14 0145  NA 138  K 4.8  CL 98*  CO2 25  GLUCOSE 107*  BUN 58*  CREATININE 8.08*  CALCIUM 8.7*     CBC:  Recent Labs Lab 12/11/14 0145  WBC 8.7  HGB 8.2*  HCT 24.8*  MCV 100.9*  PLT 251      Microbiology:  No results found for this or any previous visit (from the past 720 hour(s)).  Coagulation Studies: No results for input(s): LABPROT, INR in the last 72 hours.  Urinalysis: No results for input(s): COLORURINE, LABSPEC, PHURINE, GLUCOSEU, HGBUR,  BILIRUBINUR, KETONESUR, PROTEINUR, UROBILINOGEN, NITRITE, LEUKOCYTESUR in the last 72 hours.  Invalid input(s): APPERANCEUR    Imaging: Dg Chest Portable 1 View  12/11/2014   CLINICAL DATA:  Shortness of breath with decreased oxygen saturation.  EXAM: PORTABLE CHEST - 1 VIEW  COMPARISON:  11/18/2014  FINDINGS: Cardiac enlargement without vascular congestion. Increasing infiltration in the right mid and lower lung suggesting pneumonia. No blunting of costophrenic angles. No pneumothorax.  IMPRESSION: Increased right lung infiltrates suggesting pneumonia. Cardiac enlargement.   Electronically Signed   By: Burman Nieves M.D.   On: 12/11/2014 02:06     Medications:     . ALPRAZolam  1 mg Oral TID  . budesonide-formoterol  2 puff Inhalation BID  . calcitRIOL  0.25 mcg Oral Daily  . calcium acetate  2,001 mg Oral TID WC  . [START ON 12/12/2014] cinacalcet  60 mg Oral Q breakfast  . citalopram  20 mg Oral Daily  . docusate sodium  100 mg Oral BID  . donepezil  5 mg Oral QHS  . feeding supplement (NEPRO CARB STEADY)  237 mL Oral BID BM  . ferrous sulfate  325 mg Oral BID WC  . ipratropium-albuterol  3 mL Nebulization Once  . ipratropium-albuterol  3 mL Nebulization Q6H  . methylPREDNISolone (SOLU-MEDROL) injection  40 mg Intravenous Q6H  . midodrine  5 mg  Oral TID WC  . multivitamin with minerals  1 tablet Oral Daily  . piperacillin-tazobactam (ZOSYN)  IV  3.375 g Intravenous Q12H  . pravastatin  20 mg Oral q1800  . sodium chloride  3 mL Intravenous Q12H  . traZODone  25-50 mg Oral QHS  . vancomycin  500 mg Intravenous STAT   acetaminophen **OR** acetaminophen, baclofen, benzonatate, chlorpheniramine-HYDROcodone, hydrOXYzine, ondansetron **OR** ondansetron (ZOFRAN) IV, pantoprazole, zolpidem  Assessment/ Plan:  69 y.o. female with end-stage renal disease, on HD TTHS, SHPTH, AOCD, HTN, hyperlipidemia, COPD, DVT with PE, tx with long term coumadin and IVC filter placement, L knee TKA, R  shoulder surgery, admission for GI bleed in early August 2016  [facility] TTS [address] [facility]  1. ESRD. We'll plan on dialysis today 2. Anemia of chronic kidney disease - We'll continue IV Procrit with dialysis 3. Secondary hyperparathyroidism - We will monitor phosphorus level during hospital stay - Continue home dose of PhosLo 4. Right lung pneumonia. - Broad-spectrum antibiotics as per hospitalist team      LOS: 0 Betrice Wanat 8/23/20162:54 PM

## 2014-12-11 NOTE — Progress Notes (Signed)
[facility]  Patient Status: New - patient was admitted to hospital prior to starting [facility] services.   [facility] is providing the following services: RN, PT and OT  If patient discharges after hours, please call [phone].   Sherryll Burger 12/11/2014, 1:21 PM

## 2014-12-11 NOTE — ED Notes (Signed)
Patient transferred from hospital stretcher into hospital bed for comfort. Spoke with family to inform family patient will be waiting for next available stepdown bed. Family reported they will be going home but to please inform them when patient is moved. Elyse Jarvis [phone] (daughter), Mosley Stitz [phone] (patient's husband).

## 2014-12-11 NOTE — ED Notes (Addendum)
MD at bedside. RT called

## 2014-12-11 NOTE — Progress Notes (Signed)
ANTIBIOTIC CONSULT NOTE - INITIAL  Pharmacy Consult for Vancomycin and Zosyn Indication: pneumonia/HCAP  Allergies  Allergen Reactions  . Aspirin Other (See Comments)    Due to kidney disease  . Other Other (See Comments)    Pt states that she is allergic to Midrin and it causes headaches.     Patient Measurements: Height: 5\' 5"  (165.1 cm) Weight: 217 lb (98.431 kg) IBW/kg (Calculated) : 57 Adjusted Body Weight: 73.6 kg  Vital Signs: Temp: 98.5 F (36.9 C) (08/23 1406) Temp Source: Oral (08/23 1406) BP: 137/73 mmHg (08/23 1406) Pulse Rate: 96 (08/23 1406) Intake/Output from previous day:   Intake/Output from this shift:    Labs:  Recent Labs  12/11/14 0145  WBC 8.7  HGB 8.2*  PLT 251  CREATININE 8.08*   Estimated Creatinine Clearance: 7.7 mL/min (by C-G formula based on Cr of 8.08). No results for input(s): VANCOTROUGH, VANCOPEAK, VANCORANDOM, GENTTROUGH, GENTPEAK, GENTRANDOM, TOBRATROUGH, TOBRAPEAK, TOBRARND, AMIKACINPEAK, AMIKACINTROU, AMIKACIN in the last 72 hours.   Microbiology: No results found for this or any previous visit (from the past 720 hour(s)).  Medical History: Past Medical History  Diagnosis Date  . Osteoarthritis   . Depression   . Frequent headaches   . HTN (hypertension)   . HLD (hyperlipidemia)   . History of colon polyps 2013    colonoscopy ([name])  . 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 Systolic murmur     mild  . GERD (gastroesophageal reflux disease)   . COPD (chronic obstructive pulmonary disease)   . ESRD (end stage renal disease) 08/2011    TThSa, L forearm AV fistula, [name]  . Secondary hyperparathyroidism of renal origin   . Osteopenia 01/2013  . Bronchiectasis 08/2014     suggested by thoracic xray  . Anemia     Assessment: 69 yo female here with shortness of breath, hx of COPD, being started on vancomycin and Zosyn for  HCAP. Hx of ESRD on HD TTS as outpt.  Pt received vancomycin 1000 mg IV x1 in the ED at 0225 and Zosyn 3.375 g IV x1 at 0329.   Per floor RN, plan to dialyze pt today (consult being placed)  Goal of Therapy:  Pre-HD vanc trough goal 15-25 mcg/ml  Plan:  Will order vancomycin 500 mg IV x1 now to complete total vancomycin loading dose of 1500 mg before start of dialysis.  Will order vancomycin 750 mg IV qHD - will order as TTS schedule - starting today as pt to go to HD today Will need to continue to follow HD schedule and charting of vanc doses. Will order vanc trough prior to 3rd HD session - 8/27  Will order Zosyn 3.375 g IV q12h EI.    Marty Heck 12/11/2014,2:23 PM

## 2014-12-11 NOTE — ED Notes (Signed)
Dr. Manson Passey speaking with family, pcxr at the bedside.

## 2014-12-11 NOTE — ED Notes (Signed)
Dr. Betti Cruz, hospitalist at bedside.

## 2014-12-11 NOTE — ED Notes (Signed)
Resumed care from [place], [place]. Pt is resting comfortably on BiPap. Awaiting transfer to CCU when bed available.

## 2014-12-11 NOTE — ED Notes (Signed)
Pt assisted to toilet. O2 sat at 83% upon return to bed. Pt still on 5L [place] and sats improving as O2 administered.

## 2014-12-11 NOTE — H&P (Signed)
[facility] [place] at [facility]   PATIENT NAME: Ariel Smith    MR#:  [id number]  DATE OF BIRTH:  [birth year]  DATE OF ADMISSION:  12/11/2014  PRIMARY CARE PHYSICIAN: [name], MD   REQUESTING/REFERRING PHYSICIAN: Dr. Manson Passey  CHIEF COMPLAINT:   Chief Complaint  Patient presents with  . Shortness of Breath   cough  HISTORY OF PRESENT ILLNESS:  Ariel Smith  is a 69 y.o. female with a known history of COPD on home oxygen 3 L/m, end-stage renal disease on hemodialysis-Tuesday, Thursday, Saturday, recent hospital admission 3 weeks ago with GI bleed, history of DVT status post IVC filter, hyperlipidemia, hypertension, aortic stenosis, depression, chronic anemia, on DO NOT RESUSCITATE status was brought in by EMS with acute respiratory distress with hypoxia with O2 saturations in the mid 70s. According to the patient's daughter and patient's husband who are with the patient at this time, patient was recently discharged home from rehabilitation 3 days ago and last night she developed acute respiratory distress, hence EMS was called on-site and found the patient with severe dyspnea with O2 saturations in the mid 70s, was placed on nonrebreather mask and brought to the emergency room for further evaluation. History of cough for the past few days present, denies any fever, chills, chest pain, palpitations, nausea, vomiting, diarrhea, constipation, abdominal pain, dysuria. On arrival patient was placed on BiPAP and was given vigorous nebs and workup revealed chest x-ray with increasing right lung infiltrate suggestive of pneumonia. Lab work significant for WBC of 8.7, H&H 8.2/24.8, BUN/creatinine 58/8.08, troponin less than 0.03. After obtaining blood cultures patient was started on IV antibiotics-vancomycin and Zosyn for healthcare associated pneumonia and hospitalist service was consulted for further management. Patient currently is maintained on O2 supplementation with  BiPAP, states feeling better. Patient is on DO NOT RESUSCITATE status which is confirmed with the patient's daughter and patient's husband who are her healthcare power of attorney.  PAST MEDICAL HISTORY:   Past Medical History  Diagnosis Date  . Osteoarthritis   . Depression   . Frequent headaches   . HTN (hypertension)   . HLD (hyperlipidemia)   . History of colon polyps 2013    colonoscopy ([name])  . 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 Systolic murmur     mild  . GERD (gastroesophageal reflux disease)   . COPD (chronic obstructive pulmonary disease)   . ESRD (end stage renal disease) 08/2011    TThSa, L forearm AV fistula, [name]  . Secondary hyperparathyroidism of renal origin   . Osteopenia 01/2013  . Bronchiectasis 08/2014     suggested by thoracic xray  . Anemia     PAST SURGICAL HISTORY:   Past Surgical History  Procedure Laterality Date  . Cholecystectomy  2012  . Bunionectomy Left 2003  . Replacement total knee Right 2006  . Shoulder arthroscopy Right 2009  . Colonoscopy  08/2011    colon biopsies, [name]  . Spirometry  04/2011    WNL  . US echocardiography  06/2008    EF >55%  . Esophagogastroduodenoscopy  08/2011    gastric cardia polyp  . Tonsillectomy  1955  . Tubal ligation  1980  . Exteriorization of a continuous ambulatory peritoneal dialysis catheter  01/2013    removal 12/2103 - [name]  . Dexa  01/2013    osteopenia with -  2.0 at hip and spine  . Hospitalization  12/2013    recurrent R pleural effusion due to peritoneal fluid translocation s/p rpt thoracentesis with 1.3 L fluid removed, ERSD started on HD this hospitalization  . US echocardiography  12/2013    EF 55-60%, nl LV sys fxn, mild-mod MR, AS, increased LV posterior wall thickness, mild TR  . Esophagogastroduodenoscopy Left 11/11/2014    Procedure: ESOPHAGOGASTRODUODENOSCOPY (EGD);  Surgeon:  [name], MD;  Location: [facility] ENDOSCOPY;  Service: Endoscopy;  Laterality: Left;  . Peripheral vascular catheterization N/A 11/12/2014    Procedure: IVC Filter Insertion;  Surgeon: [name], MD;  Location: [facility] INVASIVE CV LAB;  Service: Cardiovascular;  Laterality: N/A;    SOCIAL HISTORY:   Social History  Substance Use Topics  . Smoking status: Former Smoker -- 2.00 packs/day for 30 years    Start date: 04/20/1978    Quit date: 04/20/2009  . Smokeless tobacco: Never Used  . Alcohol Use: No    FAMILY HISTORY:   Family History  Problem Relation Age of Onset  . Deep vein thrombosis Brother   . Cancer Mother 48    colon  . Cancer Father     prostate  . Hypertension Father   . Hypertension Brother   . Stroke Mother   . Diabetes Neg Hx   . CAD Neg Hx   . Cancer Daughter 67    breast  . Dementia Father 56    DRUG ALLERGIES:   Allergies  Allergen Reactions  . Aspirin Other (See Comments)    Due to kidney disease  . Other Other (See Comments)    Pt states that she is allergic to Midrin and it causes headaches.     REVIEW OF SYSTEMS:   Review of Systems  Constitutional: Positive for malaise/fatigue. Negative for fever and chills.  HENT: Negative for ear pain, hearing loss, nosebleeds, sore throat and tinnitus.   Eyes: Negative for blurred vision, double vision, pain, discharge and redness.  Respiratory: Positive for cough, shortness of breath and wheezing. Negative for hemoptysis and sputum production.   Cardiovascular: Negative for chest pain, palpitations, orthopnea and leg swelling.  Gastrointestinal: Negative for nausea, vomiting, abdominal pain, diarrhea, constipation, blood in stool and melena.  Genitourinary: Negative for dysuria, urgency, frequency and hematuria.  Musculoskeletal: Negative for back pain, joint pain and neck pain.  Skin: Negative for itching and rash.  Neurological: Negative for dizziness, tingling, sensory change, focal weakness and  seizures.  Endo/Heme/Allergies: Does not bruise/bleed easily.  Psychiatric/Behavioral: Positive for depression. The patient is not nervous/anxious.     MEDICATIONS AT HOME:   Prior to Admission medications   Medication Sig Start Date End Date Taking? Authorizing Provider  acetaminophen (TYLENOL) 500 MG tablet Take 1,000 mg by mouth every 6 (six) hours as needed for mild pain or headache.    Yes Historical Provider, MD  albuterol (VENTOLIN HFA) 108 (90 BASE) MCG/ACT inhaler Inhale 2 puffs into the lungs every 6 (six) hours as needed for wheezing or shortness of breath.    Yes Historical Provider, MD  ALPRAZolam Prudy Feeler) 1 MG tablet Take 1 tablet (1 mg total) by mouth 3 (three) times daily. 11/22/14  Yes Auburn Bilberry, MD  baclofen (LIORESAL) 10 MG tablet Take 5-10 mg by mouth 2 (two) times daily as needed for muscle spasms.   Yes Historical Provider, MD  benzonatate (TESSALON) 100 MG capsule Take 1 capsule (100 mg total) by mouth every 6 (six) hours as needed for  cough. 11/22/14  Yes Auburn Bilberry, MD  budesonide-formoterol [facility]) 160-4.5 MCG/ACT inhaler Inhale 2 puffs into the lungs 2 (two) times daily.   Yes Historical Provider, MD  calcitRIOL (ROCALTROL) 0.25 MCG capsule Take 0.25 mcg by mouth daily.   Yes Historical Provider, MD  calcium acetate (PHOSLO) 667 MG capsule Take 2,001 mg by mouth 3 (three) times daily with meals.    Yes Historical Provider, MD  chlorpheniramine-HYDROcodone (TUSSIONEX) 10-8 MG/5ML SUER Take 5 mLs by mouth every 12 (twelve) hours as needed for cough. 11/22/14  Yes Auburn Bilberry, MD  cinacalcet (SENSIPAR) 60 MG tablet Take 60 mg by mouth daily.   Yes Historical Provider, MD  citalopram (CELEXA) 20 MG tablet Take 1 tablet (20 mg total) by mouth daily. 03/30/14  Yes [name], MD  cyanocobalamin (,VITAMIN B-12,) 1000 MCG/ML injection Inject 1,000 mcg into the muscle every 30 (thirty) days. Start 10/2014   Yes [name], MD  docusate sodium (COLACE) 100 MG  capsule Take 1 capsule (100 mg total) by mouth 2 (two) times daily. 11/22/14  Yes Auburn Bilberry, MD  donepezil (ARICEPT) 5 MG tablet Take 1 tablet (5 mg total) by mouth at bedtime. 11/01/14  Yes [name], MD  ferrous sulfate 325 (65 FE) MG tablet Take 1 tablet (325 mg total) by mouth 2 (two) times daily with a meal. 11/22/14  Yes Auburn Bilberry, MD  hydrOXYzine (ATARAX/VISTARIL) 25 MG tablet Take 12.5-25 mg by mouth 2 (two) times daily as needed for anxiety.   Yes Historical Provider, MD  lidocaine-prilocaine (EMLA) cream Apply 1 application topically as needed (topical anesthesia for hemodialysis if Gebauers and Lidocaine injection are ineffective.). 11/22/14  Yes Auburn Bilberry, MD  lovastatin (MEVACOR) 20 MG tablet Take 20 mg by mouth at bedtime.   Yes Historical Provider, MD  midodrine (PROAMATINE) 5 MG tablet Take 1 tablet (5 mg total) by mouth 3 (three) times daily with meals. 11/22/14  Yes Auburn Bilberry, MD  Multiple Vitamin (MULTIVITAMIN WITH MINERALS) TABS tablet Take 1 tablet by mouth daily. 11/22/14  Yes Auburn Bilberry, MD  Nutritional Supplements (FEEDING SUPPLEMENT, NEPRO CARB STEADY,) LIQD Take 237 mLs by mouth 2 (two) times daily between meals. 11/22/14  Yes Auburn Bilberry, MD  pantoprazole (PROTONIX) 40 MG tablet Take 40 mg by mouth daily as needed (for heartburn/indigestion).   Yes Historical Provider, MD  promethazine (PHENERGAN) 12.5 MG tablet Take 12.5 mg by mouth every 6 (six) hours as needed for nausea or vomiting.   Yes Historical Provider, MD  traMADol (ULTRAM) 50 MG tablet Take 1 tablet (50 mg total) by mouth every 6 (six) hours as needed for moderate pain. 11/22/14  Yes Auburn Bilberry, MD  traZODone (DESYREL) 50 MG tablet Take 0.5-1 tablets (25-50 mg total) by mouth at bedtime. Patient taking differently: Take 25 mg by mouth at bedtime.  11/01/14  Yes [name], MD  zolpidem (AMBIEN) 5 MG tablet Take 1 tablet (5 mg total) by mouth at bedtime as needed for sleep. 11/22/14  Yes  Auburn Bilberry, MD  pravastatin (PRAVACHOL) 20 MG tablet Take 1 tablet (20 mg total) by mouth daily at 6 PM. 11/22/14   Auburn Bilberry, MD      VITAL SIGNS:  Blood pressure 108/81, pulse 111, temperature 98.5 F (36.9 C), temperature source Oral, resp. rate 16, height 5\' 5"  (1.651 m), weight 98.431 kg (217 lb), SpO2 99 %.  PHYSICAL EXAMINATION:  Physical Exam  Constitutional: She is oriented to person, place, and time. She appears well-developed and  well-nourished.  HENT:  Head: Normocephalic and atraumatic.  Right Ear: External ear normal.  Left Ear: External ear normal.  Nose: Nose normal.  Mouth/Throat: Oropharynx is clear and moist. No oropharyngeal exudate.  On BiPAP mask  Eyes: EOM are normal. Pupils are equal, round, and reactive to light. No scleral icterus.  Neck: Normal range of motion. Neck supple. No JVD present. No thyromegaly present.  Cardiovascular: Normal rate, regular rhythm and intact distal pulses.  Exam reveals no friction rub.   Murmur (Systolic) heard. Respiratory: She is in respiratory distress. She has wheezes. She has rales (Right base). She exhibits no tenderness.  GI: Soft. Bowel sounds are normal. She exhibits no distension and no mass. There is no tenderness. There is no rebound and no guarding.  Musculoskeletal: Normal range of motion. She exhibits no edema.  Lymphadenopathy:    She has no cervical adenopathy.  Neurological: She is alert and oriented to person, place, and time. She has normal reflexes. She displays normal reflexes. No cranial nerve deficit. She exhibits normal muscle tone.  Skin: Skin is warm. No rash noted. No erythema.  Psychiatric: She has a normal mood and affect. Her behavior is normal. Thought content normal.   LABORATORY PANEL:   CBC  Recent Labs Lab 12/11/14 0145  WBC 8.7  HGB 8.2*  HCT 24.8*  PLT 251    ------------------------------------------------------------------------------------------------------------------  Chemistries   Recent Labs Lab 12/11/14 0145  NA 138  K 4.8  CL 98*  CO2 25  GLUCOSE 107*  BUN 58*  CREATININE 8.08*  CALCIUM 8.7*   ------------------------------------------------------------------------------------------------------------------  Cardiac Enzymes  Recent Labs Lab 12/11/14 0145  TROPONINI <0.03   ------------------------------------------------------------------------------------------------------------------  RADIOLOGY:  Dg Chest Portable 1 View  12/11/2014   CLINICAL DATA:  Shortness of breath with decreased oxygen saturation.  EXAM: PORTABLE CHEST - 1 VIEW  COMPARISON:  11/18/2014  FINDINGS: Cardiac enlargement without vascular congestion. Increasing infiltration in the right mid and lower lung suggesting pneumonia. No blunting of costophrenic angles. No pneumothorax.  IMPRESSION: Increased right lung infiltrates suggesting pneumonia. Cardiac enlargement.   Electronically Signed   By: Burman Nieves M.D.   On: 12/11/2014 02:06    EKG:   Orders placed or performed during the hospital encounter of 12/11/14  . ED EKG  . ED EKG  Poor baseline. Sinus tachycardia with ventricular rate of 114 bpm.  IMPRESSION AND PLAN:   69 year old woman with history of multiple medical problems including COPD on home oxygen 3 L/m, end-stage renal disease on hemodialysis, recent hospital admission 3 weeks ago with GI bleed and pneumonia, on DO NOT RESUSCITATE status, brought in with the complaints of acute respiratory distress with hypoxia, diagnosed to have acute and chronic respiratory failure with hypoxia and right lower lobe pneumonia. 1. Acute on chronic respiratory failure with hypoxia secondary to combination of right lower lobe pneumonia(HCAP) and COPD exacerbation. Plan: Admit to stepdown, continue O2 supplementation through BiPAP, monitor O2  saturations. 2. Right lower lobe pneumonia, HCAP. Plan: Continue IV antibiotics-vancomycin and Zosyn, O2 supplementation. Follow-up CBC, sputum and blood cultures. 3. COPD exacerbation secondary to pneumonia. Plan: O2 supplementation, vigorous DuoNeb's, IV Solu-Medrol, follow-up O2 sats, continue home medications for COPD. 4. End-stage renal disease on hemodialysis-Tuesday, Thursday, Saturday. BUN/creatinine 58/8.08. Patient stable. Plan: Nephrology consultation placed for initiation of hemodialysis. Follow-up BMP. 5. Chronic anemia, recent GI bleed. H&H low but stable. Plan: Monitor H&H closely, continue iron supplementation. 6. History of DVT status post IVC filter placement, not on Coumadin because of  recent GI bleed. 7. Depression, stable on home medications. Continue same.    All the records are reviewed and case discussed with ED provider. Management plans discussed with the patient, family and they are in agreement.  CODE STATUS: DO NOT RESUSCITATE (confirmed with patient's daughter and husband.)  TOTAL TIME TAKING CARE OF THIS PATIENT: 50 minutes.    Jonnie Kind N M.D on 12/11/2014 at 3:26 AM  Between 7am to 6pm - Pager - [phone]  After 6pm go to [email or web address] - password [email or web address] [facility]  [facility]  Office  [phone]  CC: Primary care physician; [name], MD

## 2014-12-11 NOTE — ED Notes (Addendum)
Pt awakened while this nurse in room. Denies pain, states she is feeling a bit better. Pt has been removed from bipap and placed on 5L [place]. Oxygenation appears appropriate.

## 2014-12-11 NOTE — ED Notes (Addendum)
Pt arrives from home via EMS. Awoke tonight with SOB, pt sats in 70's on EMS arrival, placed on NRB with minimal approval. Breathing treatment administered, improved O2 sats. Pt with hx of COPD, DM, Dialysis (T, TH, S) , on 3.5 liters chronically. Recent dx and admission for PNA

## 2014-12-11 NOTE — Care Management Note (Signed)
Patient is active at [facility] [address] MWF.  I have sent admission records to clinic and will update with discharge records when it occurs. Ivor Reining Dialysis Liaison [phone]

## 2014-12-11 NOTE — ED Provider Notes (Signed)
[facility] Emergency Department Provider Note  ____________________________________________  Time seen: 1:37AM  I have reviewed the triage vital signs and the nursing notes.   HISTORY  Chief Complaint Shortness of Breath      HPI Ariel Smith is a 69 y.o. female presents via EMS in respiratory distress. Per EMS on their arrival the patient was in obvious respiratory distress initial O2 sat 72% on 5 L nasal cannula. Patient's initial O2 sat on arrival to the emergency department was 72% on 5 L nasal cannula. EMS administered 2 DuoNeb's in route to the emergency department. Of note patient was recently treated for bilateral pneumonia discharged to a rehabilitation facility and just returned home 3 days ago. Patient states that she is progressing well and acutely worsened yesterday.     Past Medical History  Diagnosis Date  . Osteoarthritis   . Depression   . Frequent headaches   . HTN (hypertension)   . HLD (hyperlipidemia)   . History of colon polyps 2013    colonoscopy ([name])  . 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 Systolic murmur     mild  . GERD (gastroesophageal reflux disease)   . COPD (chronic obstructive pulmonary disease)   . ESRD (end stage renal disease) 08/2011    TThSa, L forearm AV fistula, [name]  . Secondary hyperparathyroidism of renal origin   . Osteopenia 01/2013  . Bronchiectasis 08/2014     suggested by thoracic xray    Patient Active Problem List   Diagnosis Date Noted  . Bleeding gastrointestinal   . Aortic valve stenosis, severe   . Dyspnea   . Chronic blood loss anemia   . ESRD on dialysis 11/16/2014  . HAP (hospital-acquired pneumonia) 11/16/2014  . COPD exacerbation 11/16/2014  . DVT (deep venous thrombosis) 11/16/2014  . Acute respiratory failure with hypoxemia 11/16/2014  . Aortic valvar stenosis 11/16/2014  .  Acute respiratory distress   . UGI bleed 11/09/2014  . Vitamin D deficiency 11/03/2014  . Memory deficit 11/01/2014  . Right-sided thoracic back pain 08/17/2014  . Advanced care planning/counseling discussion 03/30/2014  . Health maintenance examination 03/30/2014  . Vitamin B12 deficiency 01/27/2014  . Bilateral leg edema 08/02/2013  . Medicare annual wellness visit, subsequent 01/27/2013  . Osteopenia 01/18/2013  . Chronic cough 10/22/2012  . Osteoarthritis   . Depression   . HTN (hypertension)   . HLD (hyperlipidemia)   . History of DVT of lower extremity   . GERD (gastroesophageal reflux disease)   . ESRD (end stage renal disease) on dialysis 08/19/2011    Past Surgical History  Procedure Laterality Date  . Cholecystectomy  2012  . Bunionectomy Left 2003  . Replacement total knee Right 2006  . Shoulder arthroscopy Right 2009  . Colonoscopy  08/2011    colon biopsies, [name]  . Spirometry  04/2011    WNL  . US echocardiography  06/2008    EF >55%  . Esophagogastroduodenoscopy  08/2011    gastric cardia polyp  . Tonsillectomy  1955  . Tubal ligation  1980  . Exteriorization of a continuous ambulatory peritoneal dialysis catheter  01/2013    removal 12/2103 - [name]  . Dexa  01/2013    osteopenia with -2.0 at hip and spine  . Hospitalization  12/2013    recurrent R pleural effusion due to peritoneal fluid  translocation s/p rpt thoracentesis with 1.3 L fluid removed, ERSD started on HD this hospitalization  . US echocardiography  12/2013    EF 55-60%, nl LV sys fxn, mild-mod MR, AS, increased LV posterior wall thickness, mild TR  . Esophagogastroduodenoscopy Left 11/11/2014    Procedure: ESOPHAGOGASTRODUODENOSCOPY (EGD);  Surgeon: [name], MD;  Location: [facility] ENDOSCOPY;  Service: Endoscopy;  Laterality: Left;  . Peripheral vascular catheterization N/A 11/12/2014    Procedure: IVC Filter Insertion;  Surgeon: [name], MD;  Location: [facility] INVASIVE CV LAB;  Service:  Cardiovascular;  Laterality: N/A;    Current Outpatient Rx  Name  Route  Sig  Dispense  Refill  . acetaminophen (TYLENOL) 500 MG tablet   Oral   Take 1,000 mg by mouth every 6 (six) hours as needed for mild pain or headache.          . albuterol (VENTOLIN HFA) 108 (90 BASE) MCG/ACT inhaler   Inhalation   Inhale 2 puffs into the lungs every 6 (six) hours as needed for wheezing or shortness of breath.          . ALPRAZolam (XANAX) 1 MG tablet   Oral   Take 1 tablet (1 mg total) by mouth 3 (three) times daily.   30 tablet   0   . baclofen (LIORESAL) 10 MG tablet   Oral   Take 5-10 mg by mouth 2 (two) times daily as needed for muscle spasms.         . benzonatate (TESSALON) 100 MG capsule   Oral   Take 1 capsule (100 mg total) by mouth every 6 (six) hours as needed for cough.   20 capsule   0   . budesonide-formoterol (SYMBICORT) 160-4.5 MCG/ACT inhaler   Inhalation   Inhale 2 puffs into the lungs 2 (two) times daily.         . calcitRIOL (ROCALTROL) 0.25 MCG capsule   Oral   Take 0.25 mcg by mouth daily.         . calcium acetate (PHOSLO) 667 MG capsule   Oral   Take 2,001 mg by mouth 3 (three) times daily with meals.          . chlorpheniramine-HYDROcodone (TUSSIONEX) 10-8 MG/5ML SUER   Oral   Take 5 mLs by mouth every 12 (twelve) hours as needed for cough.   140 mL   0   . cinacalcet (SENSIPAR) 60 MG tablet   Oral   Take 60 mg by mouth daily.         . citalopram (CELEXA) 20 MG tablet   Oral   Take 1 tablet (20 mg total) by mouth daily.   90 tablet   3   . cyanocobalamin (,VITAMIN B-12,) 1000 MCG/ML injection   Intramuscular   Inject 1,000 mcg into the muscle every 30 (thirty) days. Start 10/2014   1 mL   0   . docusate sodium (COLACE) 100 MG capsule   Oral   Take 1 capsule (100 mg total) by mouth 2 (two) times daily.   10 capsule   0   . donepezil (ARICEPT) 5 MG tablet   Oral   Take 1 tablet (5 mg total) by mouth at bedtime.   30  tablet   3   . ferrous sulfate 325 (65 FE) MG tablet   Oral   Take 1 tablet (325 mg total) by mouth 2 (two) times daily with a meal.   30 tablet   0   .  hydrOXYzine (ATARAX/VISTARIL) 25 MG tablet   Oral   Take 12.5-25 mg by mouth 2 (two) times daily as needed for anxiety.         . lidocaine-prilocaine (EMLA) cream   Topical   Apply 1 application topically as needed (topical anesthesia for hemodialysis if Gebauers and Lidocaine injection are ineffective.).   30 g   0   . lovastatin (MEVACOR) 20 MG tablet   Oral   Take 20 mg by mouth at bedtime.         . midodrine (PROAMATINE) 5 MG tablet   Oral   Take 1 tablet (5 mg total) by mouth 3 (three) times daily with meals.   30 tablet   0   . Multiple Vitamin (MULTIVITAMIN WITH MINERALS) TABS tablet   Oral   Take 1 tablet by mouth daily.   1 tablet   0   . Nutritional Supplements (FEEDING SUPPLEMENT, NEPRO CARB STEADY,) LIQD   Oral   Take 237 mLs by mouth 2 (two) times daily between meals.   1 Can   0   . pantoprazole (PROTONIX) 40 MG tablet   Oral   Take 40 mg by mouth daily as needed (for heartburn/indigestion).         . promethazine (PHENERGAN) 12.5 MG tablet   Oral   Take 12.5 mg by mouth every 6 (six) hours as needed for nausea or vomiting.         . traMADol (ULTRAM) 50 MG tablet   Oral   Take 1 tablet (50 mg total) by mouth every 6 (six) hours as needed for moderate pain.   30 tablet   0   . traZODone (DESYREL) 50 MG tablet   Oral   Take 0.5-1 tablets (25-50 mg total) by mouth at bedtime. Patient taking differently: Take 25 mg by mouth at bedtime.    30 tablet   3   . zolpidem (AMBIEN) 5 MG tablet   Oral   Take 1 tablet (5 mg total) by mouth at bedtime as needed for sleep.   30 tablet   0   . pravastatin (PRAVACHOL) 20 MG tablet   Oral   Take 1 tablet (20 mg total) by mouth daily at 6 PM.   1 tablet   0     Allergies Aspirin and Other  Family History  Problem Relation Age of  Onset  . Deep vein thrombosis Brother   . Cancer Mother 24    colon  . Cancer Father     prostate  . Hypertension Father   . Hypertension Brother   . Stroke Mother   . Diabetes Neg Hx   . CAD Neg Hx   . Cancer Daughter 4    breast  . Dementia Father 58    Social History Social History  Substance Use Topics  . Smoking status: Former Smoker -- 2.00 packs/day for 30 years    Start date: 04/20/1978    Quit date: 04/20/2009  . Smokeless tobacco: Never Used  . Alcohol Use: No    Review of Systems  Constitutional: Negative for fever. Eyes: Negative for visual changes. ENT: Negative for sore throat. Cardiovascular: Negative for chest pain. Respiratory: Positive for shortness of breath. Gastrointestinal: Negative for abdominal pain, vomiting and diarrhea. Genitourinary: Negative for dysuria. Musculoskeletal: Negative for back pain. Skin: Negative for rash. Neurological: Negative for headaches, focal weakness or numbness.   10-point ROS otherwise negative.  ____________________________________________   PHYSICAL EXAM:  VITAL SIGNS: ED  Triage Vitals  Enc Vitals Group     BP 12/11/14 0137 145/75 mmHg     Pulse Rate 12/11/14 0137 116     Resp 12/11/14 0155 18     Temp 12/11/14 0137 98.5 F (36.9 C)     Temp Source 12/11/14 0137 Oral     SpO2 12/11/14 0137 63 %     Weight 12/11/14 0200 217 lb (98.431 kg)     Height 12/11/14 0200  (1.651 m)     Head Cir --      [facility] Flow --      Pain Score 12/11/14 0145 0     Pain Loc --      Pain Edu? --      Excl. in GC? --      Constitutional: Alert and oriented. Apparent distress Eyes: Conjunctivae are normal. PERRL. Normal extraocular movements. ENT   Head: Normocephalic and atraumatic.   Nose: No congestion/rhinnorhea.   Mouth/Throat: Mucous membranes are moist.   Neck: No stridor. Hematological/Lymphatic/Immunilogical: No cervical lymphadenopathy. Cardiovascular: Normal rate, regular rhythm. Normal  and symmetric distal pulses are present in all extremities. Grade 3 systolic ejection murmur Respiratory: Normal respiratory effort without tachypnea nor retractions. Breath sounds are clear and equal bilaterally. No wheezes/rales/rhonchi. Gastrointestinal: Soft and nontender. No distention. There is no CVA tenderness. Genitourinary: deferred Musculoskeletal: Nontender with normal range of motion in all extremities. No joint effusions.  No lower extremity tenderness nor edema. Neurologic:  Normal speech and language. No gross focal neurologic deficits are appreciated. Speech is normal.  Skin:  Skin is warm, dry and intact. No rash noted. Psychiatric: Mood and affect are normal. Speech and behavior are normal. Patient exhibits appropriate insight and judgment.  ____________________________________________    LABS (pertinent positives/negatives)  Labs Reviewed  BASIC METABOLIC PANEL - Abnormal; Notable for the following:    Chloride 98 (*)    Glucose, Bld 107 (*)    BUN 58 (*)    Creatinine, Ser 8.08 (*)    Calcium 8.7 (*)    GFR calc non Af Amer 5 (*)    GFR calc Af Amer 5 (*)    All other components within normal limits  CBC - Abnormal; Notable for the following:    RBC 2.46 (*)    Hemoglobin 8.2 (*)    HCT 24.8 (*)    MCV 100.9 (*)    RDW 17.5 (*)    All other components within normal limits  BLOOD GAS, ARTERIAL - Abnormal; Notable for the following:    Allens test (pass/fail) POSITIVE (*)    All other components within normal limits  TROPONIN I     ____________________________________________   EKG  ED ECG REPORT I, Kobyn Kray, Van N, the attending physician, personally viewed and interpreted this ECG.   Date: 12/11/2014  EKG Time: 1:37 AM  Rate: 116  Rhythm: Sinus tachycardia  Axis: None  Intervals: Normal  ST&T Change: None   ____________________________________________    RADIOLOGY     DG Chest Portable 1 View (Final result) Result time: 12/11/14  02:06:54   Final result by Rad Results In Interface (12/11/14 02:06:54)   Narrative:   CLINICAL DATA: Shortness of breath with decreased oxygen saturation.  EXAM: PORTABLE CHEST - 1 VIEW  COMPARISON: 11/18/2014  FINDINGS: Cardiac enlargement without vascular congestion. Increasing infiltration in the right mid and lower lung suggesting pneumonia. No blunting of costophrenic angles. No pneumothorax.  IMPRESSION: Increased right lung infiltrates suggesting pneumonia. Cardiac enlargement.   Electronically  Signed By: Burman Nieves M.D. On: 12/11/2014 02:06     ____________________________________________   PROCEDURES    Critical Care performed: 60 minutes  ____________________________________________   INITIAL IMPRESSION / ASSESSMENT AND PLAN / ED COURSE  Pertinent labs & imaging results that were available during my care of the patient were reviewed by me and considered in my medical decision making (see chart for details).  Given initial presentation BiPAP was applied to the patient with improvement in symptoms. Patient also received a continued nebulized bronchodilators with improvement of oxygen saturation in 94%. Chest x-ray revealed increased right lung infiltrate suggesting pneumonia. Given patient's recent hospital stay antibiotic coverage for hospital-acquired pneumonia was employed. She was discussed with Dr. Kayleen Memos for hospital admission.  ____________________________________________   FINAL CLINICAL IMPRESSION(S) / ED DIAGNOSES  Final diagnoses:  HCAP (healthcare-associated pneumonia)      Darci Current, MD 12/11/14 [phone]

## 2014-12-11 NOTE — Progress Notes (Signed)
[facility] - [place] at [facility]   PATIENT NAME: Ariel Smith    MR#:  [id number]  DATE OF BIRTH:  Apr 20, 1946  SUBJECTIVE:  CHIEF COMPLAINT:   Chief Complaint  Patient presents with  . Shortness of Breath   still struggling to breathe, waiting for dialysis  REVIEW OF SYSTEMS:  Review of Systems  Constitutional: Negative for fever, weight loss, malaise/fatigue and diaphoresis.  HENT: Negative for ear discharge, ear pain, hearing loss, nosebleeds, sore throat and tinnitus.   Eyes: Negative for blurred vision and pain.  Respiratory: Positive for shortness of breath. Negative for cough, hemoptysis and wheezing.   Cardiovascular: Negative for chest pain, palpitations, orthopnea and leg swelling.  Gastrointestinal: Negative for heartburn, nausea, vomiting, abdominal pain, diarrhea, constipation and blood in stool.  Genitourinary: Negative for dysuria, urgency and frequency.  Musculoskeletal: Negative for myalgias and back pain.  Skin: Negative for itching and rash.  Neurological: Negative for dizziness, tingling, tremors, focal weakness, seizures, weakness and headaches.  Psychiatric/Behavioral: Negative for depression. The patient is not nervous/anxious.    DRUG ALLERGIES:   Allergies  Allergen Reactions  . Aspirin Other (See Comments)    Due to kidney disease  . Other Other (See Comments)    Pt states that she is allergic to Midrin and it causes headaches.    VITALS:  Blood pressure 137/73, pulse 96, temperature 98.5 F (36.9 C), temperature source Oral, resp. rate 18, height  (1.651 m), weight 98.431 kg (217 lb), SpO2 94 %. PHYSICAL EXAMINATION:  Physical Exam  Constitutional: She is oriented to person, place, and time and well-developed, well-nourished, and in no distress.  HENT:  Head: Normocephalic and atraumatic.  Eyes: Conjunctivae and EOM are normal. Pupils are equal, round, and reactive to light.  Neck: Normal range of motion. Neck  supple. No tracheal deviation present. No thyromegaly present.  Cardiovascular: Normal rate, regular rhythm and normal heart sounds.   Pulmonary/Chest: She is in respiratory distress. She has no wheezes. She has rales. She exhibits no tenderness.  Abdominal: Soft. Bowel sounds are normal. She exhibits no distension. There is no tenderness.  Musculoskeletal: Normal range of motion.  Neurological: She is alert and oriented to person, place, and time. No cranial nerve deficit.  Skin: Skin is warm and dry. No rash noted.  Psychiatric: Mood and affect normal.   LABORATORY PANEL:   CBC  Recent Labs Lab 12/11/14 0145  WBC 8.7  HGB 8.2*  HCT 24.8*  PLT 251   ------------------------------------------------------------------------------------------------------------------ Chemistries   Recent Labs Lab 12/11/14 0145  NA 138  K 4.8  CL 98*  CO2 25  GLUCOSE 107*  BUN 58*  CREATININE 8.08*  CALCIUM 8.7*   RADIOLOGY:  Dg Chest Portable 1 View  12/11/2014   CLINICAL DATA:  Shortness of breath with decreased oxygen saturation.  EXAM: PORTABLE CHEST - 1 VIEW  COMPARISON:  11/18/2014  FINDINGS: Cardiac enlargement without vascular congestion. Increasing infiltration in the right mid and lower lung suggesting pneumonia. No blunting of costophrenic angles. No pneumothorax.  IMPRESSION: Increased right lung infiltrates suggesting pneumonia. Cardiac enlargement.   Electronically Signed   By: Burman Nieves M.D.   On: 12/11/2014 02:06   ASSESSMENT AND PLAN:  69 year old woman with history of multiple medical problems including COPD on home oxygen 3 L/m, end-stage renal disease on hemodialysis, recent hospital admission 3 weeks ago with GI bleed and pneumonia, on DO NOT RESUSCITATE status, brought in with the complaints of acute respiratory distress with  hypoxia, diagnosed to have acute and chronic respiratory failure with hypoxia and right lower lobe pneumonia.  1. Acute on chronic respiratory  failure with hypoxia secondary to combination of right lower lobe pneumonia(HCAP) and COPD exacerbation.  Off BiPAP, now on nasal cannula oxygen  2. Right lower lobe pneumonia, HCAP. Continue IV antibiotics-vancomycin and Zosyn, O2 supplementation. Follow-up CBC, sputum and blood cultures.  3. COPD exacerbation secondary to pneumonia. O2 supplementation, vigorous DuoNeb's, IV Solu-Medrol, follow-up O2 sats, continue home medications for COPD.  4. End-stage renal disease on hemodialysis-Tuesday, Thursday, Saturday.  Nephrology planning dialysis today  5. Chronic anemia, recent GI bleed. H&H low but stable. Monitor H&H closely, continue iron supplementation.  6. History of DVT status post IVC filter placement, not on Coumadin because of recent GI bleed.  7. Depression, stable on home medications. Continue same.    All the records are reviewed and case discussed with Care Management/Social Worker. Management plans discussed with the patient, family and they are in agreement.  CODE STATUS: DO NOT RESUSCITATE  TOTAL TIME (CRITICAL CARE) TAKING CARE OF THIS PATIENT: 35 minutes.   More than 50% of the time was spent in counseling/coordination of care: YES  POSSIBLE D/C IN 1-2 DAYS, DEPENDING ON CLINICAL CONDITION.   [facility], Frederico Gerling M.D on 12/11/2014 at 3:58 PM  Between 7am to 6pm - Pager - [phone]  After 6pm go to [email or web address] - password [email or web address] [facility]  [facility]  Office  [phone]  CC:  Primary care physician; [name], MD

## 2014-12-12 LAB — PROCALCITONIN: PROCALCITONIN: 0.95 ng/mL

## 2014-12-12 MED ORDER — METHYLPREDNISOLONE SODIUM SUCC 40 MG IJ SOLR
40.0000 mg | Freq: Two times a day (BID) | INTRAMUSCULAR | Status: DC
  Administered 2014-12-13 – 2014-12-14 (×2): 40 mg via INTRAVENOUS
  Filled 2014-12-12 (×4): qty 1

## 2014-12-12 MED ORDER — FAMOTIDINE 20 MG PO TABS: 20.0000 mg | ORAL_TABLET | Freq: Every day | ORAL | Status: DC | PRN

## 2014-12-12 MED ORDER — LORAZEPAM 1 MG PO TABS: 1.0000 mg | ORAL_TABLET | Freq: Three times a day (TID) | ORAL | Status: DC | PRN

## 2014-12-12 NOTE — Progress Notes (Signed)
Report called to [place] on 2C. No further concerns at this time.

## 2014-12-12 NOTE — Care Management (Signed)
Patient has had recent admission to [facility] and DC to [facility] on 8/4.  Was discharged home from [facility] on 8/19 and referral made to [facility] but agency unable to make initial visit as no return calls to agency to schedule initial visit until 8/23- when patient was admitted.  Sent readmission assessment data to [facility] physician.  Patient has chronic home 02.  Notified [facility] of admission.  This patient may benefit from [facility]. Have referred this question to [facility] physician and [facility] [place] on on [facility] rotation this week.  Spoke with Delorise Shiner with [facility] and this agency is not in network with patient's insurance.  Will discuss HRI protocol with [facility] when get approval.

## 2014-12-12 NOTE — Progress Notes (Signed)
Listened attentively as Ariel Smith provided a report on patient. Ariel Smith stated that patient tolerated dialysis well. Patient and VS stabled.

## 2014-12-12 NOTE — Progress Notes (Signed)
[facility] Physicians - [place] at [facility]   PATIENT NAME: Ariel Smith    MR#:  [id number]  DATE OF BIRTH:  [birth year]  SUBJECTIVE:  CHIEF COMPLAINT:   Chief Complaint  Patient presents with  . Shortness of Breath   much better s/p HD yesterday but still continues to have SOB REVIEW OF SYSTEMS:  Review of Systems  Constitutional: Negative for fever, weight loss, malaise/fatigue and diaphoresis.  HENT: Negative for ear discharge, ear pain, hearing loss, nosebleeds, sore throat and tinnitus.   Eyes: Negative for blurred vision and pain.  Respiratory: Positive for shortness of breath. Negative for cough, hemoptysis and wheezing.   Cardiovascular: Negative for chest pain, palpitations, orthopnea and leg swelling.  Gastrointestinal: Negative for heartburn, nausea, vomiting, abdominal pain, diarrhea, constipation and blood in stool.  Genitourinary: Negative for dysuria, urgency and frequency.  Musculoskeletal: Negative for myalgias and back pain.  Skin: Negative for itching and rash.  Neurological: Negative for dizziness, tingling, tremors, focal weakness, seizures, weakness and headaches.  Psychiatric/Behavioral: Negative for depression. The patient is not nervous/anxious.    DRUG ALLERGIES:   Allergies  Allergen Reactions  . Aspirin Other (See Comments)    Due to kidney disease  . Other Other (See Comments)    Pt states that she is allergic to Midrin and it causes headaches.    VITALS:  Blood pressure 115/80, pulse 108, temperature 98.6 F (37 C), temperature source Oral, resp. rate 20, height  (1.651 m), weight 99.7 kg (219 lb 12.8 oz), SpO2 98 %. PHYSICAL EXAMINATION:  Physical Exam  Constitutional: She is oriented to person, place, and time and well-developed, well-nourished, and in no distress.  HENT:  Head: Normocephalic and atraumatic.  Eyes: Conjunctivae and EOM are normal. Pupils are equal, round, and reactive to light.  Neck: Normal range of  motion. Neck supple. No tracheal deviation present. No thyromegaly present.  Cardiovascular: Normal rate, regular rhythm and normal heart sounds.   Pulmonary/Chest: She is in respiratory distress. She has no wheezes. She has rales. She exhibits no tenderness.  Abdominal: Soft. Bowel sounds are normal. She exhibits no distension. There is no tenderness.  Musculoskeletal: Normal range of motion.  Neurological: She is alert and oriented to person, place, and time. No cranial nerve deficit.  Skin: Skin is warm and dry. No rash noted.  Psychiatric: Mood and affect normal.   LABORATORY PANEL:   CBC  Recent Labs Lab 12/11/14 1925  WBC 7.3  HGB 8.1*  HCT 24.2*  PLT 207   ------------------------------------------------------------------------------------------------------------------ Chemistries   Recent Labs Lab 12/11/14 1925  NA 139  K 5.3*  CL 97*  CO2 25  GLUCOSE 144*  BUN 69*  CREATININE 9.04*  CALCIUM 8.4*   RADIOLOGY:  No results found. ASSESSMENT AND PLAN:  69 year old woman with history of multiple medical problems including COPD on home oxygen 3 L/m, end-stage renal disease on hemodialysis, recent hospital admission 3 weeks ago with GI bleed and pneumonia, on DO NOT RESUSCITATE status, brought in with the complaints of acute respiratory distress with hypoxia, diagnosed to have acute and chronic respiratory failure with hypoxia and right lower lobe pneumonia.  1. Acute on chronic respiratory failure with hypoxia secondary to combination of right lower lobe pneumonia(HCAP) and COPD exacerbation. Continue nasal cannula oxygen  2. Right lower lobe pneumonia, HCAP. Continue IV antibiotics-vancomycin and Zosyn, O2 supplementation. Follow-up CBC, sputum and blood cultures. I doubt she has pna, will check procalcitonin to decide need for abx.  3.  COPD exacerbation secondary to pneumonia. O2 supplementation, vigorous DuoNeb's, IV Solu-Medrol, follow-up O2 sats, continue home  medications for COPD.  4. End-stage renal disease on hemodialysis-Tuesday, Thursday, Saturday.  dialysis per nephro.  5. Chronic anemia, recent GI bleed. H&H low but stable. Monitor H&H closely, continue iron supplementation.  6. History of DVT status post IVC filter placement, not on Coumadin because of recent GI bleed.  7. Depression, stable on home medications. Continue same.  Can D/C tele.    All the records are reviewed and case discussed with Care Management/Social Worker. Management plans discussed with the patient, family and they are in agreement.  CODE STATUS: DO NOT RESUSCITATE  TOTAL TIME TAKING CARE OF THIS PATIENT: 35 minutes.   More than 50% of the time was spent in counseling/coordination of care: YES  POSSIBLE D/C IN 1-2 DAYS, DEPENDING ON CLINICAL CONDITION.   [facility], Yuri Flener M.D on 12/12/2014 at 5:38 PM  Between 7am to 6pm - Pager - [phone]  After 6pm go to [email or web address] - password [email or web address]  [place] [facility]  Office  [phone]  CC:  Primary care physician; [name], MD

## 2014-12-12 NOTE — Progress Notes (Signed)
Subjective:  Patient is well known to our practice from outpatient. She is followed for outpatient dialysis. She is admitted for worsening shortness of breath and cough. Chest x-ray shows pneumonia. Pt tolerated dialysis well yesterday. 1000 cc fluid removal.  Today she feels better. Able to eat w/o nausea or vomiting.     Objective:  Vital signs in last 24 hours:  Temp:  [98.1 F (36.7 C)-98.6 F (37 C)] 98.6 F (37 C) (08/24 1118) Pulse Rate:  [96-110] 108 (08/24 1118) Resp:  [18-24] 20 (08/24 1118) BP: (110-162)/(73-107) 115/80 mmHg (08/24 1118) SpO2:  [85 %-100 %] 98 % (08/24 1118) Weight:  [98.4 kg (216 lb 14.9 oz)-100 kg (220 lb 7.4 oz)] 99.7 kg (219 lb 12.8 oz) (08/24 0500)  Weight change: -0.031 kg (-1.1 oz) Filed Weights   12/11/14 1807 12/11/14 2145 12/12/14 0500  Weight: 98.4 kg (216 lb 14.9 oz) 100 kg (220 lb 7.4 oz) 99.7 kg (219 lb 12.8 oz)    Intake/Output:    Intake/Output Summary (Last 24 hours) at 12/12/14 1320 Last data filed at 12/12/14 0830  Gross per 24 hour  Intake    153 ml  Output   1000 ml  Net   -847 ml     Physical Exam: General:  no acute distress,   HEENT  anicteric, moist mucous membranes   Neck  supple, no masses   Pulm/lungs  right basilar crackles, oxygen by nasal cannula   CVS/Heart  irregular rhythm, no rub or gallop   Abdomen:   soft, nontender, nondistended   Extremities:  no peripheral edema   Neurologic:  alert, oriented, speech normal, nonfocal   Skin:  no acute rashes   Access:  AV fistula        Basic Metabolic Panel:   Recent Labs Lab 12/11/14 0145 12/11/14 1925  NA 138 139  K 4.8 5.3*  CL 98* 97*  CO2 25 25  GLUCOSE 107* 144*  BUN 58* 69*  CREATININE 8.08* 9.04*  CALCIUM 8.7* 8.4*  PHOS  --  3.8     CBC:  Recent Labs Lab 12/11/14 0145 12/11/14 1925  WBC 8.7 7.3  HGB 8.2* 8.1*  HCT 24.8* 24.2*  MCV 100.9* 101.5*  PLT 251 207      Microbiology:  No results found for this or any previous  visit (from the past 720 hour(s)).  Coagulation Studies: No results for input(s): LABPROT, INR in the last 72 hours.  Urinalysis: No results for input(s): COLORURINE, LABSPEC, PHURINE, GLUCOSEU, HGBUR, BILIRUBINUR, KETONESUR, PROTEINUR, UROBILINOGEN, NITRITE, LEUKOCYTESUR in the last 72 hours.  Invalid input(s): APPERANCEUR    Imaging: Dg Chest Portable 1 View  12/11/2014   CLINICAL DATA:  Shortness of breath with decreased oxygen saturation.  EXAM: PORTABLE CHEST - 1 VIEW  COMPARISON:  11/18/2014  FINDINGS: Cardiac enlargement without vascular congestion. Increasing infiltration in the right mid and lower lung suggesting pneumonia. No blunting of costophrenic angles. No pneumothorax.  IMPRESSION: Increased right lung infiltrates suggesting pneumonia. Cardiac enlargement.   Electronically Signed   By: Burman Nieves M.D.   On: 12/11/2014 02:06     Medications:     . ALPRAZolam  1 mg Oral TID  . budesonide-formoterol  2 puff Inhalation BID  . calcitRIOL  0.25 mcg Oral Daily  . calcium acetate  2,001 mg Oral TID WC  . cinacalcet  60 mg Oral Q breakfast  . citalopram  20 mg Oral Daily  . docusate sodium  100 mg Oral BID  .  donepezil  5 mg Oral QHS  . [START ON 12/13/2014] epoetin (EPOGEN/PROCRIT) injection  10,000 Units Intravenous Q T,Th,Sa-HD  . feeding supplement (NEPRO CARB STEADY)  237 mL Oral BID BM  . ferrous sulfate  325 mg Oral BID WC  . ipratropium-albuterol  3 mL Nebulization Once  . ipratropium-albuterol  3 mL Nebulization Q6H  . lidocaine-prilocaine   Topical Once per day on Mon Wed Fri  . methylPREDNISolone (SOLU-MEDROL) injection  40 mg Intravenous Q6H  . midodrine  5 mg Oral TID WC  . multivitamin with minerals  1 tablet Oral Daily  . piperacillin-tazobactam (ZOSYN)  IV  3.375 g Intravenous Q12H  . pravastatin  20 mg Oral q1800  . sodium chloride  3 mL Intravenous Q12H  . traZODone  25-50 mg Oral QHS  . vancomycin  750 mg Intravenous Q T,Th,Sa-HD    acetaminophen **OR** acetaminophen, baclofen, benzonatate, chlorpheniramine-HYDROcodone, hydrOXYzine, ondansetron **OR** ondansetron (ZOFRAN) IV, pantoprazole, zolpidem  Assessment/ Plan:  69 y.o. female with end-stage renal disease, on HD TTHS, SHPTH, AOCD, HTN, hyperlipidemia, COPD, DVT with PE, tx with long term coumadin and IVC filter placement, L knee TKA, R shoulder surgery, admission for GI bleed in early August 2016  [facility] TTS [address] [facility]  1. ESRD. Continue dialysis TTS schedule, next tx tomorrow.  2. Anemia of chronic kidney disease - We'll continue IV Procrit with dialysis 3. Secondary hyperparathyroidism - We will monitor phosphorus level during hospital stay - Continue home dose of PhosLo 4. Right lung pneumonia. - Broad-spectrum antibiotics as per hospitalist team      LOS: 1 Dennisha Mouser 8/24/20161:20 PM

## 2014-12-13 LAB — CBC
HCT: 22.2 % — ABNORMAL LOW (ref 35.0–47.0)
HEMOGLOBIN: 7.3 g/dL — AB (ref 12.0–16.0)
MCH: 33.6 pg (ref 26.0–34.0)
MCHC: 33 g/dL (ref 32.0–36.0)
MCV: 101.8 fL — ABNORMAL HIGH (ref 80.0–100.0)
Platelets: 215 10*3/uL (ref 150–440)
RBC: 2.18 MIL/uL — AB (ref 3.80–5.20)
RDW: 17.9 % — ABNORMAL HIGH (ref 11.5–14.5)
WBC: 7.3 10*3/uL (ref 3.6–11.0)

## 2014-12-13 LAB — RENAL FUNCTION PANEL
Albumin: 3.2 g/dL — ABNORMAL LOW (ref 3.5–5.0)
Anion gap: 14 (ref 5–15)
BUN: 56 mg/dL — AB (ref 6–20)
CALCIUM: 8.8 mg/dL — AB (ref 8.9–10.3)
CHLORIDE: 97 mmol/L — AB (ref 101–111)
CO2: 29 mmol/L (ref 22–32)
CREATININE: 6.82 mg/dL — AB (ref 0.44–1.00)
GFR calc Af Amer: 6 mL/min — ABNORMAL LOW (ref >60)
GFR calc non Af Amer: 6 mL/min — ABNORMAL LOW (ref >60)
Glucose, Bld: 134 mg/dL — ABNORMAL HIGH (ref 65–99)
PHOSPHORUS: 4 mg/dL (ref 2.5–4.6)
POTASSIUM: 4.5 mmol/L (ref 3.5–5.1)
Sodium: 140 mmol/L (ref 135–145)

## 2014-12-13 LAB — HEPATITIS B SURFACE ANTIGEN: HEP B S AG: NEGATIVE

## 2014-12-13 NOTE — Progress Notes (Signed)
Pre-hd tx 

## 2014-12-13 NOTE — Progress Notes (Signed)
Subjective:  Patient is well known to our practice from outpatient. She is followed for outpatient dialysis. She is admitted for worsening shortness of breath and cough. Chest x-ray shows pneumonia. Patient seen during dialysis Tolerating well  Able to eat w/o nausea or vomiting.     Objective:  Vital signs in last 24 hours:  Temp:  [97.6 F (36.4 C)-97.9 F (36.6 C)] 97.6 F (36.4 C) (08/25 0840) Pulse Rate:  [87-106] 87 (08/25 1200) Resp:  [13-24] 16 (08/25 1200) BP: (108-152)/(58-83) 133/75 mmHg (08/25 1200) SpO2:  [95 %-100 %] 99 % (08/25 1200) Weight:  [102.195 kg (225 lb 4.8 oz)-103.9 kg (229 lb 0.9 oz)] 103.9 kg (229 lb 0.9 oz) (08/25 0840)  Weight change: 3.795 kg (8 lb 5.9 oz) Filed Weights   12/12/14 0500 12/13/14 0500 12/13/14 0840  Weight: 99.7 kg (219 lb 12.8 oz) 102.195 kg (225 lb 4.8 oz) 103.9 kg (229 lb 0.9 oz)    Intake/Output:    Intake/Output Summary (Last 24 hours) at 12/13/14 1219 Last data filed at 12/13/14 0550  Gross per 24 hour  Intake     48 ml  Output    100 ml  Net    -52 ml     Physical Exam: General:  no acute distress,   HEENT  anicteric, moist mucous membranes   Neck  supple, no masses   Pulm/lungs  right basilar crackles, oxygen by nasal cannula   CVS/Heart  irregular rhythm, no rub or gallop   Abdomen:   soft, nontender, nondistended   Extremities:  no peripheral edema   Neurologic:  alert, oriented, speech normal, nonfocal   Skin:  no acute rashes   Access:  AV fistula        Basic Metabolic Panel:   Recent Labs Lab 12/11/14 0145 12/11/14 1925 12/13/14 1035  NA 138 139 140  K 4.8 5.3* 4.5  CL 98* 97* 97*  CO2 25 25 29   GLUCOSE 107* 144* 134*  BUN 58* 69* 56*  CREATININE 8.08* 9.04* 6.82*  CALCIUM 8.7* 8.4* 8.8*  PHOS  --  3.8 4.0     CBC:  Recent Labs Lab 12/11/14 0145 12/11/14 1925 12/13/14 1035  WBC 8.7 7.3 7.3  HGB 8.2* 8.1* 7.3*  HCT 24.8* 24.2* 22.2*  MCV 100.9* 101.5* 101.8*  PLT 251 207 215       Microbiology:  No results found for this or any previous visit (from the past 720 hour(s)).  Coagulation Studies: No results for input(s): LABPROT, INR in the last 72 hours.  Urinalysis: No results for input(s): COLORURINE, LABSPEC, PHURINE, GLUCOSEU, HGBUR, BILIRUBINUR, KETONESUR, PROTEINUR, UROBILINOGEN, NITRITE, LEUKOCYTESUR in the last 72 hours.  Invalid input(s): APPERANCEUR    Imaging: No results found.   Medications:     . ALPRAZolam  1 mg Oral TID  . budesonide-formoterol  2 puff Inhalation BID  . calcitRIOL  0.25 mcg Oral Daily  . calcium acetate  2,001 mg Oral TID WC  . cinacalcet  60 mg Oral Q breakfast  . citalopram  20 mg Oral Daily  . docusate sodium  100 mg Oral BID  . donepezil  5 mg Oral QHS  . epoetin (EPOGEN/PROCRIT) injection  10,000 Units Intravenous Q T,Th,Sa-HD  . feeding supplement (NEPRO CARB STEADY)  237 mL Oral BID BM  . ferrous sulfate  325 mg Oral BID WC  . ipratropium-albuterol  3 mL Nebulization Once  . ipratropium-albuterol  3 mL Nebulization Q6H  . lidocaine-prilocaine   Topical  Once per day on Mon Wed Fri  . methylPREDNISolone (SOLU-MEDROL) injection  40 mg Intravenous Q12H  . midodrine  5 mg Oral TID WC  . multivitamin with minerals  1 tablet Oral Daily  . piperacillin-tazobactam (ZOSYN)  IV  3.375 g Intravenous Q12H  . pravastatin  20 mg Oral q1800  . sodium chloride  3 mL Intravenous Q12H  . traZODone  25-50 mg Oral QHS  . vancomycin  750 mg Intravenous Q T,Th,Sa-HD   acetaminophen **OR** acetaminophen, baclofen, benzonatate, chlorpheniramine-HYDROcodone, famotidine, hydrOXYzine, ondansetron **OR** ondansetron (ZOFRAN) IV, zolpidem  Assessment/ Plan:  69 y.o. female with end-stage renal disease, on HD TTHS, SHPTH, AOCD, HTN, hyperlipidemia, COPD, DVT with PE, tx with long term coumadin and IVC filter placement, L knee TKA, R shoulder surgery, admission for GI bleed in early August 2016  [facility] TTS [address] [facility]  1.  ESRD. Continue dialysis TTS schedule, Patient seen during dialysis Tolerating well .  2. Anemia of chronic kidney disease - We'll continue IV Procrit with dialysis 3. Secondary hyperparathyroidism - We will monitor phosphorus level during hospital stay - Continue home dose of PhosLo 4. Right lung pneumonia. - Broad-spectrum antibiotics as per hospitalist team [facility], Laqueta Jean     LOS: 2 Meleni Delahunt 8/25/201612:19 PM

## 2014-12-13 NOTE — Progress Notes (Signed)
[facility] - [place] at [facility]   PATIENT NAME: Ariel Smith    MR#:  [id number]  DATE OF BIRTH:  27-Jul-1945  SUBJECTIVE:  CHIEF COMPLAINT:   Chief Complaint  Patient presents with  . Shortness of Breath   seen at hemodialysis, feeling much better. REVIEW OF SYSTEMS:  Review of Systems  Constitutional: Negative for fever, weight loss, malaise/fatigue and diaphoresis.  HENT: Negative for ear discharge, ear pain, hearing loss, nosebleeds, sore throat and tinnitus.   Eyes: Negative for blurred vision and pain.  Respiratory: Positive for shortness of breath. Negative for cough, hemoptysis and wheezing.   Cardiovascular: Negative for chest pain, palpitations, orthopnea and leg swelling.  Gastrointestinal: Negative for heartburn, nausea, vomiting, abdominal pain, diarrhea, constipation and blood in stool.  Genitourinary: Negative for dysuria, urgency and frequency.  Musculoskeletal: Negative for myalgias and back pain.  Skin: Negative for itching and rash.  Neurological: Negative for dizziness, tingling, tremors, focal weakness, seizures, weakness and headaches.  Psychiatric/Behavioral: Negative for depression. The patient is not nervous/anxious.    DRUG ALLERGIES:   Allergies  Allergen Reactions  . Aspirin Other (See Comments)    Due to kidney disease  . Other Other (See Comments)    Pt states that she is allergic to Midrin and it causes headaches.    VITALS:  Blood pressure 130/73, pulse 90, temperature 97.6 F (36.4 C), temperature source Oral, resp. rate 13, height 5\' 5"  (1.651 m), weight 103.9 kg (229 lb 0.9 oz), SpO2 100 %. PHYSICAL EXAMINATION:  Physical Exam  Constitutional: She is oriented to person, place, and time and well-developed, well-nourished, and in no distress.  HENT:  Head: Normocephalic and atraumatic.  Eyes: Conjunctivae and EOM are normal. Pupils are equal, round, and reactive to light.  Neck: Normal range of motion. Neck  supple. No tracheal deviation present. No thyromegaly present.  Cardiovascular: Normal rate, regular rhythm and normal heart sounds.   Pulmonary/Chest: No respiratory distress. She has no wheezes. She has no rales. She exhibits no tenderness.  Abdominal: Soft. Bowel sounds are normal. She exhibits no distension. There is no tenderness.  Musculoskeletal: Normal range of motion.  Neurological: She is alert and oriented to person, place, and time. No cranial nerve deficit.  Skin: Skin is warm and dry. No rash noted.  Psychiatric: Mood and affect normal.   LABORATORY PANEL:   CBC  Recent Labs Lab 12/13/14 1035  WBC 7.3  HGB 7.3*  HCT 22.2*  PLT 215   ------------------------------------------------------------------------------------------------------------------ Chemistries   Recent Labs Lab 12/11/14 1925  NA 139  K 5.3*  CL 97*  CO2 25  GLUCOSE 144*  BUN 69*  CREATININE 9.04*  CALCIUM 8.4*   RADIOLOGY:  No results found. ASSESSMENT AND PLAN:  69 year old woman with history of multiple medical problems including COPD on home oxygen 3 L/m, end-stage renal disease on hemodialysis, recent hospital admission 3 weeks ago with GI bleed and pneumonia, on DO NOT RESUSCITATE status, brought in with the complaints of acute respiratory distress with hypoxia, diagnosed to have acute and chronic respiratory failure with hypoxia and right lower lobe pneumonia.  1. Acute on chronic respiratory failure with hypoxia secondary to combination of right lower lobe pneumonia(HCAP) and COPD exacerbation. Continue nasal cannula oxygen  2. Right lower lobe pneumonia, HCAP. Continue IV antibiotics- vancomycin and Zosyn, O2 supplementation. Follow-up CBC, sputum and blood cultures.   3. COPD exacerbation secondary to pneumonia. O2 supplementation, vigorous DuoNeb's, IV Solu-Medrol, follow-up O2 sats, continue home medications  for COPD.  4. End-stage renal disease on hemodialysis-Tuesday, Thursday,  Saturday.  dialysis per nephro.  5. Chronic anemia, recent GI bleed. H&H low but stable. Monitor H&H closely, continue iron supplementation.  6. History of DVT status post IVC filter placement, not on Coumadin because of recent GI bleed.  7. Depression, stable on home medications. Continue same.  We will get physical therapy evaluation - evaluate for placement  All the records are reviewed and case discussed with Care Management/Social Worker. Management plans discussed with the patient, family and they are in agreement.  CODE STATUS: DO NOT RESUSCITATE  TOTAL TIME TAKING CARE OF THIS PATIENT: 35 minutes.   More than 50% of the time was spent in counseling/coordination of care: YES  POSSIBLE D/C IN 1-2 DAYS, DEPENDING ON CLINICAL CONDITION.   [facility], Wh[place]any Parish M.D on 12/13/2014 at 11:59 AM  Between 7am to 6pm - Pager - [phone]  After 6pm go to [email or web address] - password [email or web address] [facility]  [place] [facility]  Office  [phone]  CC:  Primary care physician; [name], MD

## 2014-12-13 NOTE — Progress Notes (Signed)
Initial Nutrition Assessment    INTERVENTION:   Meals and Snacks: Cater to patient preferences; on renal diet Medical Food Supplement Therapy: recommend continuing Nepro BID Education: provide diet education as able on follow-up, on appropriate diet as present   NUTRITION DIAGNOSIS:   Altered nutrition lab value related to chronic illness as evidenced by  (hyperkalemia, receives HD).  GOAL:   Patient will meet greater than or equal to 90% of their needs  MONITOR:    (Energy Intake, Anthropometrics, Electrolyte/Renal Profile, Digestive System, Glucose Profile)  REASON FOR ASSESSMENT:    (Renal Diet, Dialsysis)    ASSESSMENT:    Pt admitted with acute on chronic respiratory failure with RLL pneumonia; pt down for dialysis on visit today. PT received HD 2 days ago with 1.5 L removed.   Past Medical History  Diagnosis Date  . Osteoarthritis   . Depression   . Frequent headaches   . HTN (hypertension)   . HLD (hyperlipidemia)   . History of colon polyps 2013    colonoscopy ([name])  . 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 Systolic murmur     mild  . GERD (gastroesophageal reflux disease)   . COPD (chronic obstructive pulmonary disease)   . ESRD (end stage renal disease) 08/2011    TThSa, L forearm AV fistula, [name]  . Secondary hyperparathyroidism of renal origin   . Osteopenia 01/2013  . Bronchiectasis 08/2014     suggested by thoracic xray  . Anemia    Past Surgical History  Procedure Laterality Date  . Cholecystectomy  2012  . Bunionectomy Left 2003  . Replacement total knee Right 2006  . Shoulder arthroscopy Right 2009  . Colonoscopy  08/2011    colon biopsies, [name]  . Spirometry  04/2011    WNL  . US echocardiography  06/2008    EF >55%  . Esophagogastroduodenoscopy  08/2011    gastric cardia polyp  . Tonsillectomy  1955  . Tubal ligation  1980  .  Exteriorization of a continuous ambulatory peritoneal dialysis catheter  01/2013    removal 12/2103 - [name]  . Dexa  01/2013    osteopenia with -2.0 at hip and spine  . Hospitalization  12/2013    recurrent R pleural effusion due to peritoneal fluid translocation s/p rpt thoracentesis with 1.3 L fluid removed, ERSD started on HD this hospitalization  . US echocardiography  12/2013    EF 55-60%, nl LV sys fxn, mild-mod MR, AS, increased LV posterior wall thickness, mild TR  . Esophagogastroduodenoscopy Left 11/11/2014    Procedure: ESOPHAGOGASTRODUODENOSCOPY (EGD);  Surgeon: [name], MD;  Location: [facility] ENDOSCOPY;  Service: Endoscopy;  Laterality: Left;  . Peripheral vascular catheterization N/A 11/12/2014    Procedure: IVC Filter Insertion;  Surgeon: [name], MD;  Location: [facility] INVASIVE CV LAB;  Service: Cardiovascular;  Laterality: N/A;    Diet Order:  Diet renal with fluid restriction Fluid restriction:: 1200 mL Fluid; Room service appropriate?: Yes; Fluid consistency:: Thin  Energy Intake: recorded po intake 100%, pt also receiving Nepro BID. Pt drinking Nepro on recent admission as well.   Food and nutrition related history: unable to assess  Electrolyte/Renal Profile and Glucose Profile  Recent Labs Lab 12/11/14 0145 12/11/14 1925  NA 138 139  K 4.8 5.3*  CL 98* 97*  CO2 25 25  BUN 58* 69*  CREATININE 8.08* 9.04*  CALCIUM 8.7* 8.4*  PHOS  --  3.8  GLUCOSE 107* 144*   Meds: phoslo, sensipar, colace, MVI, midodrine, solumedrol, pravastatin  Skin:  Reviewed, no issues  Last BM:  8/24  Height:   Ht Readings from Last 1 Encounters:  12/11/14  (1.651 m)    Weight: weights relatively stable over the past 2 years per weight encounters; do not know if pt dry weight as changed  Wt Readings from Last 1 Encounters:  12/13/14 225 lb 4.8 oz (102.195 kg)   Filed Weights   12/11/14 2145 12/12/14 0500 12/13/14 0500  Weight: 220 lb 7.4 oz (100 kg) 219 lb 12.8 oz  (99.7 kg) 225 lb 4.8 oz (102.195 kg)    Wt Readings from Last 10 Encounters:  12/13/14 225 lb 4.8 oz (102.195 kg)  11/22/14 217 lb 3.2 oz (98.521 kg)  11/01/14 217 lb 4 oz (98.544 kg)  08/17/14 208 lb 8 oz (94.575 kg)  03/30/14 209 lb (94.802 kg)  12/04/13 200 lb 12 oz (91.06 kg)  08/02/13 214 lb (97.07 kg)  01/27/13 219 lb 4 oz (99.451 kg)  10/20/12 225 lb (102.059 kg)    Ideal Body Weight:   125 pounds  BMI:  Body mass index is 37.49 kg/(m^2).  LOW Care Level  Romelle Starcher MS, [place], LDN [phone] Pager

## 2014-12-13 NOTE — Care Management Important Message (Signed)
Important Message  Patient Details  Name: NELROSE WIMBLEY MRN: [id number] Date of Birth: [birth year]   Medicare Important Message Given:  Alessandra Bevels notification given    Olegario Messier A Allmond 12/13/2014, 10:14 AM

## 2014-12-13 NOTE — Progress Notes (Signed)
PT Cancellation Note  Patient Details Name: Ariel Smith MRN: [id number] DOB: Oct 09, 1945   Cancelled Treatment:    Reason Eval/Treat Not Completed: Patient at procedure or test/unavailable. Patient is currently at hemodialysis and unavailable for mobility evaluation. PT will re-attempt when patient is available and appropriate.   Kerin Ransom, PT, DPT    12/13/2014, 12:15 PM

## 2014-12-13 NOTE — Progress Notes (Signed)
Post hd tx 

## 2014-12-13 NOTE — Evaluation (Signed)
Physical Therapy Evaluation Patient Details Name: Ariel Smith MRN: [id number] DOB: 23-Jul-1945 Today's Date: 12/13/2014   History of Present Illness  Pt is a 69 y.o. female (h/o COPD and on home O2; ESRD on HD T/R/Sat) presenting to hospital with SOB and cough 12/11/14 and admitted with acute on chronic respiratory failure with hypoxia and R lower lobe PNA.  Pt with recent admit about 3 weeks ago with GI bleed (s/p IVC filter 11/12/14 with L LE DVT) and discharged to [facility] (pt returned home 12/07/14).  Clinical Impression  Currently pt demonstrates limitations with functional mobility d/t SOB and O2 desaturation with functional mobility.  Prior to admission, pt was recently discharged home 12/07/14 from STR and was independent ambulating with RW.  Pt lives with her husband in 1 level home with 5 steps to enter with railing.  Currently pt is CGA with transfers and ambulation with RW (pt's O2 on 4 L/min via nasal cannula decreased from 91% at rest to 85% post ambulation but returned to 92% within a couple minutes; nursing present and aware).  Pt would benefit from skilled PT to address above noted impairments and functional limitations.  Recommend pt discharge to home with support of family when medically appropriate.     Follow Up Recommendations No PT follow up    Equipment Recommendations       Recommendations for Other Services       Precautions / Restrictions Precautions Precautions: Fall Precaution Comments: No BP L UE (AVF) Restrictions Weight Bearing Restrictions: No      Mobility  Bed Mobility Overal bed mobility: Modified Independent Bed Mobility: Supine to Sit;Sit to Supine     Supine to sit: Modified independent (Device/Increase time);HOB elevated Sit to supine: Modified independent (Device/Increase time);HOB elevated      Transfers Overall transfer level: Needs assistance Equipment used: Rolling walker (2 wheeled) Transfers: Sit to/from Stand Sit to Stand: Min  guard         General transfer comment: steady without loss of balance  Ambulation/Gait Ambulation/Gait assistance: Min guard Ambulation Distance (Feet): 50 Feet Assistive device: Rolling walker (2 wheeled) Gait Pattern/deviations: Step-through pattern Gait velocity: decreased   General Gait Details: limited due to SOB and fatigue  Stairs            Wheelchair Mobility    Modified Rankin (Stroke Patients Only)       Balance Overall balance assessment: Needs assistance Sitting-balance support: No upper extremity supported;Feet supported Sitting balance-Leahy Scale: Good     Standing balance support: Bilateral upper extremity supported Standing balance-Leahy Scale: Good                               Pertinent Vitals/Pain Pain Assessment: No/denies pain  HR 104-111 bpm during session.    Home Living Family/patient expects to be discharged to:: Private residence Living Arrangements: Spouse/significant other Available Help at Discharge: Family Type of Home: House Home Access: Stairs to enter Entrance Stairs-Rails: Right Entrance Stairs-Number of Steps: 5 Home Layout: One level Home Equipment: Cane - single point;Walker - 2 wheels;Walker - 4 wheels      Prior Function Level of Independence: Independent with assistive device(s)         Comments: Indep with household and limited community with RW; reports 1 fall in previous year.  Husband transports to/from dialysis.     Hand Dominance        Extremity/Trunk Assessment   Upper Extremity  Assessment: Generalized weakness (B UE ROM WFL)           Lower Extremity Assessment: Generalized weakness (B LE ROM WFL)         Communication   Communication: No difficulties  Cognition Arousal/Alertness: Awake/alert Behavior During Therapy: WFL for tasks assessed/performed Overall Cognitive Status: Within Functional Limits for tasks assessed                      General Comments    Nursing cleared pt for participation in physical therapy.  Pt agreeable to PT session.  Pt's husband present during session.    Exercises        Assessment/Plan    PT Assessment Patient needs continued PT services  PT Diagnosis Generalized weakness;Difficulty walking   PT Problem List Cardiopulmonary status limiting activity;Decreased activity tolerance;Decreased strength;Decreased balance;Decreased mobility  PT Treatment Interventions DME instruction;Gait training;Stair training;Functional mobility training;Therapeutic activities;Therapeutic exercise;Balance training;Patient/family education   PT Goals (Current goals can be found in the Care Plan section) Acute Rehab PT Goals Patient Stated Goal: To go home PT Goal Formulation: With patient Time For Goal Achievement: 12/27/14 Potential to Achieve Goals: Good    Frequency Min 2X/week   Barriers to discharge        Co-evaluation               End of Session Equipment Utilized During Treatment: Gait belt;Oxygen (4 L/min via nasal cannula) Activity Tolerance: Patient limited by fatigue (limited d/t SOB) Patient left: in bed;with call bell/phone within reach;with bed alarm set;with family/visitor present Nurse Communication: Mobility status (O2 desaturation with activity)         Time: 1610-9604 PT Time Calculation (min) (ACUTE ONLY): 20 min   Charges:   PT Evaluation $Initial PT Evaluation Tier I: 1 Procedure     PT G CodesHendricks Limes Jan 12, 2015, 4:31 PM Hendricks Limes, PT [phone]

## 2014-12-13 NOTE — Progress Notes (Signed)
HD tx completed.

## 2014-12-13 NOTE — Progress Notes (Signed)
Hemodialysis start 

## 2014-12-14 ENCOUNTER — Inpatient Hospital Stay: Payer: Medicare Other

## 2014-12-14 ENCOUNTER — Telehealth: Payer: Self-pay

## 2014-12-14 LAB — BASIC METABOLIC PANEL
ANION GAP: 8 (ref 5–15)
BUN: 31 mg/dL — AB (ref 6–20)
CHLORIDE: 99 mmol/L — AB (ref 101–111)
CO2: 31 mmol/L (ref 22–32)
Calcium: 8.5 mg/dL — ABNORMAL LOW (ref 8.9–10.3)
Creatinine, Ser: 4.3 mg/dL — ABNORMAL HIGH (ref 0.44–1.00)
GFR calc Af Amer: 11 mL/min — ABNORMAL LOW (ref >60)
GFR calc non Af Amer: 10 mL/min — ABNORMAL LOW (ref >60)
Glucose, Bld: 124 mg/dL — ABNORMAL HIGH (ref 65–99)
POTASSIUM: 5.9 mmol/L — AB (ref 3.5–5.1)
SODIUM: 138 mmol/L (ref 135–145)

## 2014-12-14 LAB — PROCALCITONIN: Procalcitonin: 1.26 ng/mL

## 2014-12-14 LAB — CBC
HCT: 23.6 % — ABNORMAL LOW (ref 35.0–47.0)
HEMOGLOBIN: 7.7 g/dL — AB (ref 12.0–16.0)
MCH: 33.6 pg (ref 26.0–34.0)
MCHC: 32.5 g/dL (ref 32.0–36.0)
MCV: 103.3 fL — AB (ref 80.0–100.0)
Platelets: 190 10*3/uL (ref 150–440)
RBC: 2.29 MIL/uL — AB (ref 3.80–5.20)
RDW: 17.4 % — ABNORMAL HIGH (ref 11.5–14.5)
WBC: 6 10*3/uL (ref 3.6–11.0)

## 2014-12-14 LAB — POTASSIUM: POTASSIUM: 5.6 mmol/L — AB (ref 3.5–5.1)

## 2014-12-14 MED ORDER — SODIUM POLYSTYRENE SULFONATE 15 GM/60ML PO SUSP
30.0000 g | Freq: Once | ORAL | Status: AC
  Administered 2014-12-14: 30 g via ORAL
  Filled 2014-12-14: qty 120

## 2014-12-14 MED ORDER — AMOXICILLIN-POT CLAVULANATE 875-125 MG PO TABS: 1.0000 | ORAL_TABLET | Freq: Two times a day (BID) | ORAL | Status: DC

## 2014-12-14 NOTE — Progress Notes (Signed)
Subjective:  Patient is well known to our practice from outpatient. She is followed for outpatient dialysis. She is admitted for worsening shortness of breath and cough. Chest x-ray shows pneumonia.  Doing better today Able to ambulate Reports soreness over left lower chest from coughing No N/V     Objective:  Vital signs in last 24 hours:  Temp:  [97.7 F (36.5 C)-98.3 F (36.8 C)] 98.3 F (36.8 C) (08/26 0815) Pulse Rate:  [87-111] 98 (08/26 0815) Resp:  [16-24] 17 (08/26 0815) BP: (117-149)/(64-127) 117/72 mmHg (08/26 0815) SpO2:  [85 %-99 %] 97 % (08/26 0823) FiO2 (%):  [36 %] 36 % (08/25 1424) Weight:  [100.3 kg (221 lb 1.9 oz)-102.4 kg (225 lb 12 oz)] 100.3 kg (221 lb 1.9 oz) (08/26 0500)  Weight change: 1.705 kg (3 lb 12.1 oz) Filed Weights   12/13/14 0840 12/13/14 1249 12/14/14 0500  Weight: 103.9 kg (229 lb 0.9 oz) 102.4 kg (225 lb 12 oz) 100.3 kg (221 lb 1.9 oz)    Intake/Output:    Intake/Output Summary (Last 24 hours) at 12/14/14 1158 Last data filed at 12/14/14 0950  Gross per 24 hour  Intake    638 ml  Output   1500 ml  Net   -862 ml     Physical Exam: General:  no acute distress,   HEENT  anicteric, moist mucous membranes   Neck  supple, no masses   Pulm/lungs  normal effort, scattered rhonchi otherwise clear,oxygen by nasal cannula   CVS/Heart  irregular rhythm, no rub or gallop , harsh systolic murmur  Abdomen:   soft, nontender, nondistended   Extremities:  no peripheral edema   Neurologic:  alert, oriented, speech normal, nonfocal   Skin:  no acute rashes   Access:  AV fistula        Basic Metabolic Panel:   Recent Labs Lab 12/11/14 0145 12/11/14 1925 12/13/14 1035 12/14/14 0444  NA 138 139 140 138  K 4.8 5.3* 4.5 5.9*  CL 98* 97* 97* 99*  CO2 GLUCOSE 107* 144* 134* 124*  BUN 58* 69* 56* 31*  CREATININE 8.08* 9.04* 6.82* 4.30*  CALCIUM 8.7* 8.4* 8.8* 8.5*  PHOS  --  3.8 4.0  --      CBC:  Recent Labs Lab  12/11/14 0145 12/11/14 1925 12/13/14 1035 12/14/14 0444  WBC 8.7 7.3 7.3 6.0  HGB 8.2* 8.1* 7.3* 7.7*  HCT 24.8* 24.2* 22.2* 23.6*  MCV 100.9* 101.5* 101.8* 103.3*  PLT 251 207 215 190      Microbiology:  No results found for this or any previous visit (from the past 720 hour(s)).  Coagulation Studies: No results for input(s): LABPROT, INR in the last 72 hours.  Urinalysis: No results for input(s): COLORURINE, LABSPEC, PHURINE, GLUCOSEU, HGBUR, BILIRUBINUR, KETONESUR, PROTEINUR, UROBILINOGEN, NITRITE, LEUKOCYTESUR in the last 72 hours.  Invalid input(s): APPERANCEUR    Imaging: Dg Chest 2 View  12/14/2014   CLINICAL DATA:  Shortness of Breath  EXAM: CHEST - 2 VIEW  COMPARISON:  12/11/2014  FINDINGS: Cardiac shadow is stable. The lungs are well aerated bilaterally. The previously seen right basilar infiltrate has shown some mild improvement when compare with the prior exam. No new focal infiltrate is seen.  IMPRESSION: Slight improved aeration in the right lung base.   Electronically Signed   By: Alcide Clever M.D.   On: 12/14/2014 08:00     Medications:     . ALPRAZolam  1 mg  Oral TID  . budesonide-formoterol  2 puff Inhalation BID  . calcitRIOL  0.25 mcg Oral Daily  . calcium acetate  2,001 mg Oral TID WC  . cinacalcet  60 mg Oral Q breakfast  . citalopram  20 mg Oral Daily  . docusate sodium  100 mg Oral BID  . donepezil  5 mg Oral QHS  . epoetin (EPOGEN/PROCRIT) injection  10,000 Units Intravenous Q T,Th,Sa-HD  . feeding supplement (NEPRO CARB STEADY)  237 mL Oral BID BM  . ferrous sulfate  325 mg Oral BID WC  . ipratropium-albuterol  3 mL Nebulization Once  . ipratropium-albuterol  3 mL Nebulization Q6H  . lidocaine-prilocaine   Topical Once per day on Mon Wed Fri  . methylPREDNISolone (SOLU-MEDROL) injection  40 mg Intravenous Q12H  . midodrine  5 mg Oral TID WC  . multivitamin with minerals  1 tablet Oral Daily  . piperacillin-tazobactam (ZOSYN)  IV  3.375 g  Intravenous Q12H  . pravastatin  20 mg Oral q1800  . sodium chloride  3 mL Intravenous Q12H  . traZODone  25-50 mg Oral QHS  . vancomycin  750 mg Intravenous Q T,Th,Sa-HD   acetaminophen **OR** acetaminophen, baclofen, benzonatate, chlorpheniramine-HYDROcodone, famotidine, hydrOXYzine, ondansetron **OR** ondansetron (ZOFRAN) IV, zolpidem  Assessment/ Plan:  69 y.o. female with end-stage renal disease, on HD TTHS, SHPTH, AOCD, HTN, hyperlipidemia, COPD, DVT with PE, tx with long term coumadin and IVC filter placement, L knee TKA, R shoulder surgery, admission for GI bleed in early August 2016  [facility] TTS [address]  1. ESRD. Continue dialysis TTS schedule, Next HD tentatively tomorrow  2. Anemia of chronic kidney disease - We'll continue IV Procrit with dialysis  3. Secondary hyperparathyroidism - We will monitor phosphorus level during hospital stay - Continue home dose of PhosLo  4. Right lung pneumonia. - Broad-spectrum antibiotics as per hospitalist team VANC, ZOSYN  5. HYPERKALEMIA - repeat K  If high dialyze today     LOS: 3 Ariel Smith 8/26/201611:58 AM

## 2014-12-14 NOTE — Progress Notes (Signed)
IV removed, discharge instructions and follow-up appointments discussed with pt and husband. They both verbalized understanding and had bo questions.

## 2014-12-14 NOTE — Discharge Instructions (Signed)

## 2014-12-14 NOTE — Care Management (Signed)
Late Entry- due to down time in epic- [facility] is aware of need for [facility]

## 2014-12-14 NOTE — Telephone Encounter (Signed)
-----   Message from Coralee Rud sent at 12/14/2014  3:44 PM EDT ----- Regarding: TCM/PH 01/11/2015 2:00 Eula Listen, PA

## 2014-12-14 NOTE — Telephone Encounter (Signed)
Attempted to contact pt regarding discharge from [facility] on 12/14/14. No answer, vm is full, unable to leave message.

## 2014-12-16 NOTE — Discharge Summary (Signed)
[facility] [place] [facility]   PATIENT NAME: Ariel Smith    MR#:  [id number]  DATE OF BIRTH:  1945-08-07  DATE OF ADMISSION:  12/11/2014 ADMITTING PHYSICIAN: Delfino Lovett, MD  DATE OF DISCHARGE: 12/14/2014  5:09 PM  PRIMARY CARE PHYSICIAN: [name], MD    ADMISSION DIAGNOSIS:  HCAP (healthcare-associated pneumonia) [J18.9]  DISCHARGE DIAGNOSIS:  Principal Problem:   Acute on chronic respiratory failure with hypoxia Active Problems:   HTN (hypertension)   ESRD (end stage renal disease) on dialysis   COPD exacerbation   HCAP (healthcare-associated pneumonia)   ESRD on hemodialysis   SECONDARY DIAGNOSIS:   Past Medical History  Diagnosis Date  . Osteoarthritis   . Depression   . Frequent headaches   . HTN (hypertension)   . HLD (hyperlipidemia)   . History of colon polyps 2013    colonoscopy ([name])  . 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 Systolic murmur     mild  . GERD (gastroesophageal reflux disease)   . COPD (chronic obstructive pulmonary disease)   . ESRD (end stage renal disease) 08/2011    TThSa, L forearm AV fistula, [name]  . Secondary hyperparathyroidism of renal origin   . Osteopenia 01/2013  . Bronchiectasis 08/2014     suggested by thoracic xray  . Anemia    HOSPITAL COURSE:  69 y.o. female with a known history of COPD on home oxygen 3 L/m, end-stage renal disease on hemodialysis-Tuesday, Thursday, Saturday, recent hospital admission 3 weeks ago with GI bleed, history of DVT status post IVC filter, hyperlipidemia, hypertension, aortic stenosis, depression, chronic anemia, on  status was brought in by EMS with acute respiratory distress with hypoxia with O2 saturations in the mid 70s. Please see Dr Reddy's dictated H & P for further details.  She was found to have Acute on chronic respiratory failure with hypoxia  secondary to combination of right lower lobe pneumonia(HCAP) and COPD exacerbation. She was treated with broad spectrum abx and nebs. Her biggest issue was thought to be fluid overload due to underlying aortic stenosis with dialysis. She underwent 2 HD while in the Hospital and was feeling much better and close to her baseline and was D/C home in stable condition.  She was agreeable with D/C plans. DISCHARGE CONDITIONS:  stable CONSULTS OBTAINED:  Treatment Team:  Mosetta Pigeon, MD DRUG ALLERGIES:   Allergies  Allergen Reactions  . Aspirin Other (See Comments)    Due to kidney disease  . Other Other (See Comments)    Pt states that she is allergic to Midrin and it causes headaches.    DISCHARGE MEDICATIONS:   Discharge Medication List as of 12/14/2014  4:08 PM    START taking these medications   Details  amoxicillin-clavulanate (AUGMENTIN) 875-125 MG per tablet Take 1 tablet by mouth 2 (two) times daily., Starting 12/14/2014, Until Discontinued, Normal      CONTINUE these medications which have NOT CHANGED   Details  acetaminophen (TYLENOL) 500 MG tablet Take 1,000 mg by mouth every 6 (six) hours as needed for mild pain or headache. , Until Discontinued, Historical Med    albuterol (VENTOLIN HFA) 108 (90 BASE) MCG/ACT inhaler Inhale 2 puffs into the lungs every 6 (six) hours as needed for wheezing or shortness of breath. , Until Discontinued, Historical Med    ALPRAZolam (XANAX) 1 MG tablet  Take 1 tablet (1 mg total) by mouth 3 (three) times daily., Starting 11/22/2014, Until Discontinued, Print    baclofen (LIORESAL) 10 MG tablet Take 5-10 mg by mouth 2 (two) times daily as needed for muscle spasms., Until Discontinued, Historical Med    benzonatate (TESSALON) 100 MG capsule Take 1 capsule (100 mg total) by mouth every 6 (six) hours as needed for cough., Starting 11/22/2014, Until Discontinued, Normal    budesonide-formoterol (SYMBICORT) 160-4.5 MCG/ACT inhaler Inhale 2 puffs into  the lungs 2 (two) times daily., Until Discontinued, Historical Med    calcitRIOL (ROCALTROL) 0.25 MCG capsule Take 0.25 mcg by mouth daily., Until Discontinued, Historical Med    calcium acetate (PHOSLO) 667 MG capsule Take 2,001 mg by mouth 3 (three) times daily with meals. , Until Discontinued, Historical Med    chlorpheniramine-HYDROcodone (TUSSIONEX) 10-8 MG/5ML SUER Take 5 mLs by mouth every 12 (twelve) hours as needed for cough., Starting 11/22/2014, Until Discontinued, Print    cinacalcet (SENSIPAR) 60 MG tablet Take 60 mg by mouth daily., Until Discontinued, Historical Med    citalopram (CELEXA) 20 MG tablet Take 1 tablet (20 mg total) by mouth daily., Starting 03/30/2014, Until Discontinued, Normal    cyanocobalamin (,VITAMIN B-12,) 1000 MCG/ML injection Inject 1,000 mcg into the muscle every 30 (thirty) days. Start 10/2014, Until Discontinued, Historical Med    docusate sodium (COLACE) 100 MG capsule Take 1 capsule (100 mg total) by mouth 2 (two) times daily., Starting 11/22/2014, Until Discontinued, Normal    donepezil (ARICEPT) 5 MG tablet Take 1 tablet (5 mg total) by mouth at bedtime., Starting 11/01/2014, Until Discontinued, Normal    ferrous sulfate 325 (65 FE) MG tablet Take 1 tablet (325 mg total) by mouth 2 (two) times daily with a meal., Starting 11/22/2014, Until Discontinued, Normal    hydrOXYzine (ATARAX/VISTARIL) 25 MG tablet Take 12.5-25 mg by mouth 2 (two) times daily as needed for anxiety., Until Discontinued, Historical Med    lidocaine-prilocaine (EMLA) cream Apply 1 application topically as needed (topical anesthesia for hemodialysis if Gebauers and Lidocaine injection are ineffective.)., Starting 11/22/2014, Until Discontinued, Normal    lovastatin (MEVACOR) 20 MG tablet Take 20 mg by mouth at bedtime., Until Discontinued, Historical Med    midodrine (PROAMATINE) 5 MG tablet Take 1 tablet (5 mg total) by mouth 3 (three) times daily with meals., Starting 11/22/2014, Until  Discontinued, Normal    Multiple Vitamin (MULTIVITAMIN WITH MINERALS) TABS tablet Take 1 tablet by mouth daily., Starting 11/22/2014, Until Discontinued, Normal    Nutritional Supplements (FEEDING SUPPLEMENT, NEPRO CARB STEADY,) LIQD Take 237 mLs by mouth 2 (two) times daily between meals., Starting 11/22/2014, Until Discontinued, Normal    pantoprazole (PROTONIX) 40 MG tablet Take 40 mg by mouth daily as needed (for heartburn/indigestion)., Until Discontinued, Historical Med    promethazine (PHENERGAN) 12.5 MG tablet Take 12.5 mg by mouth every 6 (six) hours as needed for nausea or vomiting., Until Discontinued, Historical Med    traMADol (ULTRAM) 50 MG tablet Take 1 tablet (50 mg total) by mouth every 6 (six) hours as needed for moderate pain., Starting 11/22/2014, Until Discontinued, Print    traZODone (DESYREL) 50 MG tablet Take 0.5-1 tablets (25-50 mg total) by mouth at bedtime., Starting 11/01/2014, Until Discontinued, Normal    zolpidem (AMBIEN) 5 MG tablet Take 1 tablet (5 mg total) by mouth at bedtime as needed for sleep., Starting 11/22/2014, Until Discontinued, Print    pravastatin (PRAVACHOL) 20 MG tablet Take 1 tablet (20 mg total) by  mouth daily at 6 PM., Starting 11/22/2014, Until Discontinued, Normal       DISCHARGE INSTRUCTIONS:   DIET:  Renal diet DISCHARGE CONDITION:  Good ACTIVITY:  Activity as tolerated OXYGEN:  Home Oxygen: No.  Oxygen Delivery: room air DISCHARGE LOCATION:  home   If you experience worsening of your admission symptoms, develop shortness of breath, life threatening emergency, suicidal or homicidal thoughts you must seek medical attention immediately by calling 911 or calling your MD immediately  if symptoms less severe.  You Must read complete instructions/literature along with all the possible adverse reactions/side effects for all the Medicines you take and that have been prescribed to you. Take any new Medicines after you have completely understood  and accpet all the possible adverse reactions/side effects.   Please note  You were cared for by a hospitalist during your hospital stay. If you have any questions about your discharge medications or the care you received while you were in the hospital after you are discharged, you can call the unit and asked to speak with the hospitalist on call if the hospitalist that took care of you is not available. Once you are discharged, your primary care physician will handle any further medical issues. Please note that NO REFILLS for any discharge medications will be authorized once you are discharged, as it is imperative that you return to your primary care physician (or establish a relationship with a primary care physician if you do not have one) for your aftercare needs so that they can reassess your need for medications and monitor your lab values.    On the day of Discharge:  VITAL SIGNS:  Blood pressure 137/73, pulse 109, temperature 98.7 F (37.1 C), temperature source Oral, resp. rate 17, height 5\' 5"  (1.651 m), weight 100.3 kg (221 lb 1.9 oz), SpO2 91 %. PHYSICAL EXAMINATION:  GENERAL:  69 y.o.-year-old patient lying in the bed with no acute distress.  EYES: Pupils equal, round, reactive to light and accommodation. No scleral icterus. Extraocular muscles intact.  HEENT: Head atraumatic, normocephalic. Oropharynx and nasopharynx clear.  NECK:  Supple, no jugular venous distention. No thyroid enlargement, no tenderness.  LUNGS: Normal breath sounds bilaterally, no wheezing, rales,rhonchi or crepitation. No use of accessory muscles of respiration.  CARDIOVASCULAR: S1, S2 normal. No murmurs, rubs, or gallops.  ABDOMEN: Soft, non-tender, non-distended. Bowel sounds present. No organomegaly or mass.  EXTREMITIES: No pedal edema, cyanosis, or clubbing.  NEUROLOGIC: Cranial nerves II through XII are intact. Muscle strength 5/5 in all extremities. Sensation intact. Gait not checked.  PSYCHIATRIC: The  patient is alert and oriented x 3.  SKIN: No obvious rash, lesion, or ulcer.  DATA REVIEW:   CBC  Recent Labs Lab 12/14/14 0444  WBC 6.0  HGB 7.7*  HCT 23.6*  PLT 190    Chemistries   Recent Labs Lab 12/14/14 0444 12/14/14 1320  NA 138  --   K 5.9* 5.6*  CL 99*  --   CO2 31  --   GLUCOSE 124*  --   BUN 31*  --   CREATININE 4.30*  --   CALCIUM 8.5*  --    Microbiology Results  Results for orders placed or performed during the hospital encounter of 11/09/14  MRSA PCR Screening     Status: None   Collection Time: 11/09/14  9:40 PM  Result Value Ref Range Status   MRSA by PCR NEGATIVE NEGATIVE Final    Comment:        The  GeneXpert MRSA Assay (FDA approved for NASAL specimens only), is one component of a comprehensive MRSA colonization surveillance program. It is not intended to diagnose MRSA infection nor to guide or monitor treatment for MRSA infections.     Management plans discussed with the patient, family and they are in agreement.  CODE STATUS: DO NOT RESUSCITATE  TOTAL TIME TAKING CARE OF THIS PATIENT: 55 minutes.    Detar North, Rogelio Winbush M.D on 12/16/2014 at 9:01 PM  Between 7am to 6pm - Pager - [phone]  After 6pm go to [email or web address] - password [email or web address] [facility]  [facility]  Office  [phone]  CC: Primary care physician; [name], MD

## 2014-12-17 ENCOUNTER — Telehealth: Payer: Self-pay | Admitting: *Deleted

## 2014-12-17 ENCOUNTER — Ambulatory Visit: Payer: Medicare Other | Admitting: Family Medicine

## 2014-12-17 NOTE — Telephone Encounter (Signed)
Transition Care Management Follow-up Telephone Call   Date discharged? 12/14/14   How have you been since you were released from the hospital? Still having some SOB, but improving.   Do you understand why you were in the hospital? yes   Do you understand the discharge instructions? yes   Where were you discharged to? Home   Items Reviewed:  Medications reviewed: yes  Allergies reviewed: yes  Dietary changes reviewed: no  Referrals reviewed: no   Functional Questionnaire:   Activities of Daily Living (ADLs):   She states they are independent in the following: ambulation, bathing and hygiene, feeding, continence, grooming, toileting and dressing States they require assistance with the following: None   Any transportation issues/concerns?: yes   Any patient concerns? no   Confirmed importance and date/time of follow-up visits scheduled yes, 12/21/14 @ 1130  Provider Appointment booked with [name], MD  Confirmed with patient if condition begins to worsen call PCP or go to the ER.  Patient was given the office number and encouraged to call back with question or concerns.  : yes

## 2014-12-20 ENCOUNTER — Encounter: Payer: Self-pay | Admitting: Intensive Care

## 2014-12-20 ENCOUNTER — Emergency Department: Payer: Medicare Other

## 2014-12-20 ENCOUNTER — Telehealth: Payer: Self-pay

## 2014-12-20 ENCOUNTER — Inpatient Hospital Stay
Admission: EM | Admit: 2014-12-20 | Discharge: 2014-12-22 | DRG: 189 | Disposition: A | Discharge status: Home Health | Payer: Medicare Other | Attending: Internal Medicine | Admitting: Internal Medicine

## 2014-12-20 DIAGNOSIS — Z86718 Personal history of other venous thrombosis and embolism: Secondary | ICD-10-CM

## 2014-12-20 DIAGNOSIS — I10 Essential (primary) hypertension: Secondary | ICD-10-CM | POA: Diagnosis present

## 2014-12-20 DIAGNOSIS — Z79899 Other long term (current) drug therapy: Secondary | ICD-10-CM | POA: Diagnosis not present

## 2014-12-20 DIAGNOSIS — Z79891 Long term (current) use of opiate analgesic: Secondary | ICD-10-CM

## 2014-12-20 DIAGNOSIS — Z992 Dependence on renal dialysis: Secondary | ICD-10-CM | POA: Diagnosis not present

## 2014-12-20 DIAGNOSIS — Z9862 Peripheral vascular angioplasty status: Secondary | ICD-10-CM | POA: Diagnosis not present

## 2014-12-20 DIAGNOSIS — N2581 Secondary hyperparathyroidism of renal origin: Secondary | ICD-10-CM | POA: Diagnosis present

## 2014-12-20 DIAGNOSIS — R748 Abnormal levels of other serum enzymes: Secondary | ICD-10-CM | POA: Diagnosis present

## 2014-12-20 DIAGNOSIS — Z7901 Long term (current) use of anticoagulants: Secondary | ICD-10-CM

## 2014-12-20 DIAGNOSIS — Z96651 Presence of right artificial knee joint: Secondary | ICD-10-CM | POA: Diagnosis present

## 2014-12-20 DIAGNOSIS — Z86711 Personal history of pulmonary embolism: Secondary | ICD-10-CM | POA: Diagnosis not present

## 2014-12-20 DIAGNOSIS — I120 Hypertensive chronic kidney disease with stage 5 chronic kidney disease or end stage renal disease: Secondary | ICD-10-CM | POA: Diagnosis present

## 2014-12-20 DIAGNOSIS — M1990 Unspecified osteoarthritis, unspecified site: Secondary | ICD-10-CM | POA: Diagnosis present

## 2014-12-20 DIAGNOSIS — F331 Major depressive disorder, recurrent, moderate: Secondary | ICD-10-CM | POA: Diagnosis present

## 2014-12-20 DIAGNOSIS — R0600 Dyspnea, unspecified: Secondary | ICD-10-CM

## 2014-12-20 DIAGNOSIS — J811 Chronic pulmonary edema: Secondary | ICD-10-CM

## 2014-12-20 DIAGNOSIS — Z9981 Dependence on supplemental oxygen: Secondary | ICD-10-CM | POA: Diagnosis not present

## 2014-12-20 DIAGNOSIS — J9621 Acute and chronic respiratory failure with hypoxia: Principal | ICD-10-CM | POA: Diagnosis present

## 2014-12-20 DIAGNOSIS — Z888 Allergy status to other drugs, medicaments and biological substances status: Secondary | ICD-10-CM | POA: Diagnosis not present

## 2014-12-20 DIAGNOSIS — E785 Hyperlipidemia, unspecified: Secondary | ICD-10-CM | POA: Diagnosis present

## 2014-12-20 DIAGNOSIS — Z7951 Long term (current) use of inhaled steroids: Secondary | ICD-10-CM

## 2014-12-20 DIAGNOSIS — R0902 Hypoxemia: Secondary | ICD-10-CM

## 2014-12-20 DIAGNOSIS — Z87891 Personal history of nicotine dependence: Secondary | ICD-10-CM | POA: Diagnosis not present

## 2014-12-20 DIAGNOSIS — J449 Chronic obstructive pulmonary disease, unspecified: Secondary | ICD-10-CM | POA: Diagnosis present

## 2014-12-20 DIAGNOSIS — Z66 Do not resuscitate: Secondary | ICD-10-CM | POA: Diagnosis present

## 2014-12-20 DIAGNOSIS — K219 Gastro-esophageal reflux disease without esophagitis: Secondary | ICD-10-CM | POA: Diagnosis present

## 2014-12-20 DIAGNOSIS — F329 Major depressive disorder, single episode, unspecified: Secondary | ICD-10-CM | POA: Diagnosis present

## 2014-12-20 DIAGNOSIS — I509 Heart failure, unspecified: Secondary | ICD-10-CM

## 2014-12-20 DIAGNOSIS — N186 End stage renal disease: Secondary | ICD-10-CM

## 2014-12-20 DIAGNOSIS — R7989 Other specified abnormal findings of blood chemistry: Secondary | ICD-10-CM

## 2014-12-20 DIAGNOSIS — D631 Anemia in chronic kidney disease: Secondary | ICD-10-CM | POA: Diagnosis present

## 2014-12-20 DIAGNOSIS — I350 Nonrheumatic aortic (valve) stenosis: Secondary | ICD-10-CM | POA: Diagnosis present

## 2014-12-20 DIAGNOSIS — I959 Hypotension, unspecified: Secondary | ICD-10-CM | POA: Diagnosis not present

## 2014-12-20 DIAGNOSIS — R778 Other specified abnormalities of plasma proteins: Secondary | ICD-10-CM

## 2014-12-20 DIAGNOSIS — M8580 Other specified disorders of bone density and structure, unspecified site: Secondary | ICD-10-CM | POA: Diagnosis present

## 2014-12-20 LAB — COMPREHENSIVE METABOLIC PANEL
ALBUMIN: 3.4 g/dL — AB (ref 3.5–5.0)
ALT: 13 U/L — ABNORMAL LOW (ref 14–54)
ANION GAP: 14 (ref 5–15)
AST: 23 U/L (ref 15–41)
Alkaline Phosphatase: 79 U/L (ref 38–126)
BUN: 22 mg/dL — AB (ref 6–20)
CHLORIDE: 97 mmol/L — AB (ref 101–111)
CO2: 28 mmol/L (ref 22–32)
Calcium: 8.5 mg/dL — ABNORMAL LOW (ref 8.9–10.3)
Creatinine, Ser: 3.63 mg/dL — ABNORMAL HIGH (ref 0.44–1.00)
GFR calc Af Amer: 14 mL/min — ABNORMAL LOW (ref >60)
GFR, EST NON AFRICAN AMERICAN: 12 mL/min — AB (ref >60)
Glucose, Bld: 102 mg/dL — ABNORMAL HIGH (ref 65–99)
POTASSIUM: 3.7 mmol/L (ref 3.5–5.1)
Sodium: 139 mmol/L (ref 135–145)
Total Bilirubin: 0.4 mg/dL (ref 0.3–1.2)
Total Protein: 6.4 g/dL — ABNORMAL LOW (ref 6.5–8.1)

## 2014-12-20 LAB — CBC WITH DIFFERENTIAL/PLATELET
BASOS ABS: 0.1 10*3/uL (ref 0–0.1)
BASOS PCT: 1 %
EOS ABS: 0.1 10*3/uL (ref 0–0.7)
Eosinophils Relative: 1 %
HEMATOCRIT: 24.5 % — AB (ref 35.0–47.0)
HEMOGLOBIN: 8.2 g/dL — AB (ref 12.0–16.0)
Lymphocytes Relative: 14 %
Lymphs Abs: 1 10*3/uL (ref 1.0–3.6)
MCH: 34 pg (ref 26.0–34.0)
MCHC: 33.3 g/dL (ref 32.0–36.0)
MCV: 102.1 fL — ABNORMAL HIGH (ref 80.0–100.0)
MONOS PCT: 10 %
Monocytes Absolute: 0.7 10*3/uL (ref 0.2–0.9)
NEUTROS ABS: 5.4 10*3/uL (ref 1.4–6.5)
NEUTROS PCT: 74 %
Platelets: 240 10*3/uL (ref 150–440)
RBC: 2.4 MIL/uL — AB (ref 3.80–5.20)
RDW: 18.1 % — ABNORMAL HIGH (ref 11.5–14.5)
WBC: 7.3 10*3/uL (ref 3.6–11.0)

## 2014-12-20 LAB — TROPONIN I: Troponin I: 0.04 ng/mL — ABNORMAL HIGH (ref <0.031)

## 2014-12-20 LAB — BRAIN NATRIURETIC PEPTIDE: B NATRIURETIC PEPTIDE 5: 2108 pg/mL — AB (ref 0.0–100.0)

## 2014-12-20 MED ORDER — FUROSEMIDE 10 MG/ML IJ SOLN
20.0000 mg | Freq: Once | INTRAMUSCULAR | Status: AC
  Administered 2014-12-20: 20 mg via INTRAVENOUS
  Filled 2014-12-20: qty 4

## 2014-12-20 MED ORDER — IPRATROPIUM-ALBUTEROL 0.5-2.5 (3) MG/3ML IN SOLN
RESPIRATORY_TRACT | Status: AC
  Administered 2014-12-20: 3 mL via RESPIRATORY_TRACT
  Filled 2014-12-20: qty 3

## 2014-12-20 MED ORDER — IPRATROPIUM-ALBUTEROL 0.5-2.5 (3) MG/3ML IN SOLN
3.0000 mL | Freq: Once | RESPIRATORY_TRACT | Status: AC
  Administered 2014-12-20: 3 mL via RESPIRATORY_TRACT

## 2014-12-20 MED ORDER — ALPRAZOLAM 0.5 MG PO TABS
1.0000 mg | ORAL_TABLET | Freq: Once | ORAL | Status: AC
  Administered 2014-12-20: 1 mg via ORAL
  Filled 2014-12-20: qty 2

## 2014-12-20 NOTE — Telephone Encounter (Signed)
Mandy with [facility] at pts home now; O2 sat 79-90 % sitting with 3L of O2 and pulse 115. Difficulty breathing and SOB but no CP. Right now O2 sat at 93 % with P 92. Dr Reece Agar said pt needs to go to ED for eval. Mandy voiced understanding and will instruct pt needs to go for eval.

## 2014-12-20 NOTE — ED Provider Notes (Signed)
[facility] Emergency Department Provider Note  ____________________________________________  Time seen: Approximately 10:33 PM  I have reviewed the triage vital signs and the nursing notes.   HISTORY  Chief Complaint Shortness of Breath    HPI Ariel Smith is a 69 y.o. female patient gets dialysis she had her dialysis today. Patient also has severe aortic stenosis. Patient has COPD and hypertension. Patient was noted by home health after dialysis to be very short of breath and hypoxic when she got up and moved around the house. This resulted in her being sent to the emergency room. The emergency room patient was noted to have edema in her legs and be working to breathe. The patient and crackles in the lungs and chest x-ray showed congestive heart failure. As noted patient had just had dialysis today. Patient did not have any chest pain or any other complaints. The shortness of breath appeared to come on fairly gradually over the last few days. Did not improve after dialysis. Patient does have a known history of DVT and says she has a screener filter has been placed with a DVT  Past Medical History  Diagnosis Date  . Osteoarthritis   . Depression   . Frequent headaches   . HTN (hypertension)   . HLD (hyperlipidemia)   . History of colon polyps 2013    colonoscopy ([name])  . 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 Systolic murmur     mild  . GERD (gastroesophageal reflux disease)   . COPD (chronic obstructive pulmonary disease)   . ESRD (end stage renal disease) 08/2011    TThSa, L forearm AV fistula, [name]  . Secondary hyperparathyroidism of renal origin   . Osteopenia 01/2013  . Bronchiectasis 08/2014     suggested by thoracic xray  . Anemia   . Nonrheumatic aortic valve stenosis     unspecified  . DVT (deep venous thrombosis)     Chronic    Patient  Active Problem List   Diagnosis Date Noted  . Acute on chronic respiratory failure with hypoxia 12/11/2014  . HCAP (healthcare-associated pneumonia) 12/11/2014  . ESRD on hemodialysis 12/11/2014  . Bleeding gastrointestinal   . Aortic valve stenosis, severe   . Dyspnea   . Chronic blood loss anemia   . ESRD on dialysis 11/16/2014  . HAP (hospital-acquired pneumonia) 11/16/2014  . COPD exacerbation 11/16/2014  . DVT (deep venous thrombosis) 11/16/2014  . Acute respiratory failure with hypoxemia 11/16/2014  . Aortic valvar stenosis 11/16/2014  . Acute respiratory distress   . UGI bleed 11/09/2014  . Vitamin D deficiency 11/03/2014  . Memory deficit 11/01/2014  . Right-sided thoracic back pain 08/17/2014  . Advanced care planning/counseling discussion 03/30/2014  . Health maintenance examination 03/30/2014  . Vitamin B12 deficiency 01/27/2014  . Bilateral leg edema 08/02/2013  . Medicare annual wellness visit, subsequent 01/27/2013  . Osteopenia 01/18/2013  . Chronic cough 10/22/2012  . Osteoarthritis   . Depression   . HTN (hypertension)   . HLD (hyperlipidemia)   . History of DVT of lower extremity   . GERD (gastroesophageal reflux disease)   . ESRD (end stage renal disease) on dialysis 08/19/2011    Past Surgical History  Procedure Laterality Date  . Cholecystectomy  2012  . Bunionectomy Left 2003  . Replacement total knee Right 2006  . Shoulder arthroscopy Right 2009  .  Colonoscopy  08/2011    colon biopsies, [name]  . Spirometry  04/2011    WNL  . US echocardiography  06/2008    EF >55%  . Esophagogastroduodenoscopy  08/2011    gastric cardia polyp  . Tonsillectomy  1955  . Tubal ligation  1980  . Exteriorization of a continuous ambulatory peritoneal dialysis catheter  01/2013    removal 12/2103 - [name]  . Dexa  01/2013    osteopenia with -2.0 at hip and spine  . Hospitalization  12/2013    recurrent R pleural effusion due to peritoneal fluid translocation  s/p rpt thoracentesis with 1.3 L fluid removed, ERSD started on HD this hospitalization  . US echocardiography  12/2013    EF 55-60%, nl LV sys fxn, mild-mod MR, AS, increased LV posterior wall thickness, mild TR  . Esophagogastroduodenoscopy Left 11/11/2014    Procedure: ESOPHAGOGASTRODUODENOSCOPY (EGD);  Surgeon: [name], MD;  Location[facility] ENDOSCOPY;  Service: Endoscopy;  Laterality: Left;  . Peripheral vascular catheterization N/A 11/12/2014    Procedure: IVC Filter Insertion;  Surgeon: [name], MD;  Location: [facility] INVASIVE CV LAB;  Service: Cardiovascular;  Laterality: N/A;    Current Outpatient Rx  Name  Route  Sig  Dispense  Refill  . acetaminophen (TYLENOL) 500 MG tablet   Oral   Take 1,000 mg by mouth every 6 (six) hours as needed for mild pain or headache.          . albuterol (VENTOLIN HFA) 108 (90 BASE) MCG/ACT inhaler   Inhalation   Inhale 2 puffs into the lungs every 6 (six) hours as needed for wheezing or shortness of breath.          . ALPRAZolam (XANAX) 1 MG tablet   Oral   Take 1 tablet (1 mg total) by mouth 3 (three) times daily.   30 tablet   0   . amoxicillin-clavulanate (AUGMENTIN) 875-125 MG per tablet   Oral   Take 1 tablet by mouth 2 (two) times daily.   14 tablet   0   . baclofen (LIORESAL) 10 MG tablet   Oral   Take 5-10 mg by mouth 2 (two) times daily as needed for muscle spasms.         . benzonatate (TESSALON) 100 MG capsule   Oral   Take 1 capsule (100 mg total) by mouth every 6 (six) hours as needed for cough.   20 capsule   0   . budesonide-formoterol (SYMBICORT) 160-4.5 MCG/ACT inhaler   Inhalation   Inhale 2 puffs into the lungs 2 (two) times daily.         . calcitRIOL (ROCALTROL) 0.25 MCG capsule   Oral   Take 0.25 mcg by mouth daily.         . calcium acetate (PHOSLO) 667 MG capsule   Oral   Take 2,001 mg by mouth 3 (three) times daily with meals.          . chlorpheniramine-HYDROcodone (TUSSIONEX) 10-8 MG/5ML  SUER   Oral   Take 5 mLs by mouth every 12 (twelve) hours as needed for cough.   140 mL   0   . cinacalcet (SENSIPAR) 60 MG tablet   Oral   Take 60 mg by mouth daily.         . citalopram (CELEXA) 20 MG tablet   Oral   Take 1 tablet (20 mg total) by mouth daily.   90 tablet   3   .  cyanocobalamin (,VITAMIN B-12,) 1000 MCG/ML injection   Intramuscular   Inject 1,000 mcg into the muscle every 30 (thirty) days. Start 10/2014   1 mL   0   . docusate sodium (COLACE) 100 MG capsule   Oral   Take 1 capsule (100 mg total) by mouth 2 (two) times daily.   10 capsule   0   . donepezil (ARICEPT) 5 MG tablet   Oral   Take 1 tablet (5 mg total) by mouth at bedtime.   30 tablet   3   . hydrOXYzine (ATARAX/VISTARIL) 25 MG tablet   Oral   Take 12.5-25 mg by mouth 2 (two) times daily as needed for anxiety.         . lidocaine-prilocaine (EMLA) cream   Topical   Apply 1 application topically as needed (topical anesthesia for hemodialysis if Gebauers and Lidocaine injection are ineffective.).   30 g   0   . lovastatin (MEVACOR) 20 MG tablet   Oral   Take 20 mg by mouth at bedtime.         . Multiple Vitamin (MULTIVITAMIN WITH MINERALS) TABS tablet   Oral   Take 1 tablet by mouth daily.   1 tablet   0   . Nutritional Supplements (FEEDING SUPPLEMENT, NEPRO CARB STEADY,) LIQD   Oral   Take 237 mLs by mouth 2 (two) times daily between meals.   1 Can   0   . pantoprazole (PROTONIX) 40 MG tablet   Oral   Take 40 mg by mouth daily as needed (for heartburn/indigestion).         . promethazine (PHENERGAN) 12.5 MG tablet   Oral   Take 12.5 mg by mouth every 6 (six) hours as needed for nausea or vomiting.         . traZODone (DESYREL) 50 MG tablet   Oral   Take 0.5-1 tablets (25-50 mg total) by mouth at bedtime. Patient taking differently: Take 25 mg by mouth at bedtime.    30 tablet   3   . zolpidem (AMBIEN) 5 MG tablet   Oral   Take 1 tablet (5 mg total) by  mouth at bedtime as needed for sleep.   30 tablet   0   . ferrous sulfate 325 (65 FE) MG tablet   Oral   Take 1 tablet (325 mg total) by mouth 2 (two) times daily with a meal.   30 tablet   0   . midodrine (PROAMATINE) 5 MG tablet   Oral   Take 1 tablet (5 mg total) by mouth 3 (three) times daily with meals.   30 tablet   0   . pravastatin (PRAVACHOL) 20 MG tablet   Oral   Take 1 tablet (20 mg total) by mouth daily at 6 PM.   1 tablet   0   . traMADol (ULTRAM) 50 MG tablet   Oral   Take 1 tablet (50 mg total) by mouth every 6 (six) hours as needed for moderate pain.   30 tablet   0     Allergies Aspirin and Other  Family History  Problem Relation Age of Onset  . Deep vein thrombosis Brother   . Cancer Mother 29    colon  . Cancer Father     prostate  . Hypertension Father   . Hypertension Brother   . Stroke Mother   . Diabetes Neg Hx   . CAD Neg Hx   . Cancer Daughter  42    breast  . Dementia Father 54    Social History Social History  Substance Use Topics  . Smoking status: Former Smoker -- 2.00 packs/day for 30 years    Start date: 04/20/1978    Quit date: 04/20/2009  . Smokeless tobacco: Never Used  . Alcohol Use: No    Review of Systems Constitutional: No fever/chills Eyes: No visual changes. ENT: No sore throat. Cardiovascular: Denies chest pain. Respiratory: See history of present illness Gastrointestinal: No abdominal pain.  No nausea, no vomiting.  No diarrhea.  No constipation. Genitourinary: Negative for dysuria. Musculoskeletal: Negative for back pain. Skin: Negative for rash. Neurological: Negative for headaches, focal weakness or numbness.  10-point ROS otherwise negative.  ____________________________________________   PHYSICAL EXAM:  VITAL SIGNS: ED Triage Vitals  Enc Vitals Group     BP 12/20/14 1759 158/82 mmHg     Pulse Rate 12/20/14 1759 91     Resp 12/20/14 1759 21     Temp 12/20/14 1759 97.9 F (36.6 C)      Temp Source 12/20/14 1759 Oral     SpO2 12/20/14 1759 96 %     Weight 12/20/14 1759 219 lb 9.2 oz (99.599 kg)     Height 12/20/14 1759 5\' 4"  (1.626 m)     Head Cir --      Peak Flow --      Pain Score --      Pain Loc --      Pain Edu? --      Excl. in GC? --     Constitutional: Alert and oriented. Chronically ill-appearing female who is working to breathe Eyes: Conjunctivae are normal. PERRL. EOMI. Head: Atraumatic. Nose: No congestion/rhinnorhea. Mouth/Throat: Mucous membranes are moist.  Oropharynx non-erythematous. Neck: No stridor.  Cardiovascular: Normal rate, regular rhythm. Grossly normal heart sounds.  Good peripheral circulation. Respiratory: Increased respiratory effort.  No retractions. Lungs scattered crackles. Gastrointestinal: Soft and nontender. No distention. No abdominal bruits. No CVA tenderness. Musculoskeletal: No lower extremity tenderness patient does have edema 1+ to the knees.  No joint effusions. Neurologic:  Normal speech and language. No gross focal neurologic deficits are appreciated. No gait instability. Skin:  Skin is warm, dry and intact. No rash noted. Psychiatric: Mood and affect are normal. Speech and behavior are normal.  ____________________________________________   LABS (all labs ordered are listed, but only abnormal results are displayed)  Labs Reviewed  COMPREHENSIVE METABOLIC PANEL - Abnormal; Notable for the following:    Chloride 97 (*)    Glucose, Bld 102 (*)    BUN 22 (*)    Creatinine, Ser 3.63 (*)    Calcium 8.5 (*)    Total Protein 6.4 (*)    Albumin 3.4 (*)    ALT 13 (*)    GFR calc non Af Amer 12 (*)    GFR calc Af Amer 14 (*)    All other components within normal limits  TROPONIN I - Abnormal; Notable for the following:    Troponin I 0.04 (*)    All other components within normal limits  CBC WITH DIFFERENTIAL/PLATELET - Abnormal; Notable for the following:    RBC 2.40 (*)    Hemoglobin 8.2 (*)    HCT 24.5 (*)     MCV 102.1 (*)    RDW 18.1 (*)    All other components within normal limits  BRAIN NATRIURETIC PEPTIDE   ____________________________________________  EKG  EKG read and interpreted by me shows normal sinus rhythm  no acute changes and occasional PVCs ____________________________________________  RADIOLOGY  Chest x-ray read by radiology as increased pelvic congestion and enlarged heart. I agree with the with the reading patient has congestive heart failure ____________________________________________   PROCEDURES    ____________________________________________   INITIAL IMPRESSION / ASSESSMENT AND PLAN / ED COURSE  Pertinent labs & imaging results that were available during my care of the patient were reviewed by me and considered in my medical decision making (see chart for details).   ____________________________________________   FINAL CLINICAL IMPRESSION(S) / ED DIAGNOSES  Final diagnoses:  Elevated troponin  Acute on chronic congestive heart failure, unspecified congestive heart failure type  Dyspnea  Hypoxia      Arnaldo Natal, MD 12/20/14 2238

## 2014-12-20 NOTE — Telephone Encounter (Signed)
Agree. Dyspnea with desaturation needs further eval today. Remaining dyspneic even with O2 sat at 93%. Recent hospitalization for HCAP. ESRD

## 2014-12-20 NOTE — ED Notes (Signed)
Lab called about BNP. Lab verbalized purple top is in lab and will be run.

## 2014-12-20 NOTE — H&P (Addendum)
[facility] [place] at [facility]   PATIENT NAME: Ariel Smith    MR#:  [id number]  DATE OF BIRTH:  22-Feb-1946  DATE OF ADMISSION:  12/20/2014  PRIMARY CARE PHYSICIAN: [name], MD   REQUESTING/REFERRING PHYSICIAN: Darnelle Catalan, MD  CHIEF COMPLAINT:   Chief Complaint  Patient presents with  . Shortness of Breath    HISTORY OF PRESENT ILLNESS:  Ilian Riling  is a 69 y.o. female who presents with persistent shortness of breath. Patient states that over the past couple of months she's had 2 bouts of pneumonia, this is superimposed on baseline COPD and end-stage renal disease with tenuous fluid balance situation. She states that she has aortic stenosis which is a limiting factor in regulating her fluid balance of dialysis. She comes in today with worsening shortness of breath was evaluated by her home health nurse which led to the recommendation to come to the ED by her primary care physician. She is on home oxygen, and her sats have been dropping at home with even minimal exertion. She also had increased work of breathing at rest. On evaluation in the ED she was noted to have pulmonary edema on chest x-ray. She did just have a dialysis session the day prior to presentation in the ED, but she does not know how much fluid was pulled. In the ED she has required increase in her supplemental oxygen amount in order to maintain oxygen sats had an appropriate level. Hospitals were called for admission for acute on chronic respiratory failure.  PAST MEDICAL HISTORY:   Past Medical History  Diagnosis Date  . Osteoarthritis   . Depression   . Frequent headaches   . HTN (hypertension)   . HLD (hyperlipidemia)   . History of colon polyps 2013    colonoscopy ([name])  . 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 Systolic murmur     mild  . GERD (gastroesophageal reflux disease)    . COPD (chronic obstructive pulmonary disease)   . ESRD (end stage renal disease) 08/2011    TThSa, L forearm AV fistula, [name]  . Secondary hyperparathyroidism of renal origin   . Osteopenia 01/2013  . Bronchiectasis 08/2014     suggested by thoracic xray  . Anemia   . Nonrheumatic aortic valve stenosis     unspecified  . DVT (deep venous thrombosis)     Chronic    PAST SURGICAL HISTORY:   Past Surgical History  Procedure Laterality Date  . Cholecystectomy  2012  . Bunionectomy Left 2003  . Replacement total knee Right 2006  . Shoulder arthroscopy Right 2009  . Colonoscopy  08/2011    colon biopsies, [name]  . Spirometry  04/2011    WNL  . US echocardiography  06/2008    EF >55%  . Esophagogastroduodenoscopy  08/2011    gastric cardia polyp  . Tonsillectomy  1955  . Tubal ligation  1980  . Exteriorization of a continuous ambulatory peritoneal dialysis catheter  01/2013    removal 12/2103 - [name]  . Dexa  01/2013    osteopenia with -2.0 at hip and spine  . Hospitalization  12/2013    recurrent R pleural effusion due to peritoneal fluid translocation s/p rpt thoracentesis with 1.3 L fluid removed, ERSD started on HD this hospitalization  . US echocardiography  12/2013    EF 55-60%,  nl LV sys fxn, mild-mod MR, AS, increased LV posterior wall thickness, mild TR  . Esophagogastroduodenoscopy Left 11/11/2014    Procedure: ESOPHAGOGASTRODUODENOSCOPY (EGD);  Surgeon: [name], MD;  Location: [facility] ENDOSCOPY;  Service: Endoscopy;  Laterality: Left;  . Peripheral vascular catheterization N/A 11/12/2014    Procedure: IVC Filter Insertion;  Surgeon: [name], MD;  Location: [facility] INVASIVE CV LAB;  Service: Cardiovascular;  Laterality: N/A;    SOCIAL HISTORY:   Social History  Substance Use Topics  . Smoking status: Former Smoker -- 2.00 packs/day for 30 years    Start date: 04/20/1978    Quit date: 04/20/2009  . Smokeless tobacco: Never Used  . Alcohol Use: No     FAMILY HISTORY:   Family History  Problem Relation Age of Onset  . Deep vein thrombosis Brother   . Cancer Mother 64    colon  . Cancer Father     prostate  . Hypertension Father   . Hypertension Brother   . Stroke Mother   . Diabetes Neg Hx   . CAD Neg Hx   . Cancer Daughter 77    breast  . Dementia Father 35    DRUG ALLERGIES:   Allergies  Allergen Reactions  . Aspirin Other (See Comments)    Due to kidney disease  . Other Other (See Comments)    Pt states that she is allergic to Midrin and it causes headaches.     MEDICATIONS AT HOME:   Prior to Admission medications   Medication Sig Start Date End Date Taking? Authorizing Provider  acetaminophen (TYLENOL) 500 MG tablet Take 1,000 mg by mouth every 6 (six) hours as needed for mild pain or headache.    Yes Historical Provider, MD  albuterol (VENTOLIN HFA) 108 (90 BASE) MCG/ACT inhaler Inhale 2 puffs into the lungs every 6 (six) hours as needed for wheezing or shortness of breath.    Yes Historical Provider, MD  ALPRAZolam Prudy Feeler) 1 MG tablet Take 1 tablet (1 mg total) by mouth 3 (three) times daily. 11/22/14  Yes Auburn Bilberry, MD  amoxicillin-clavulanate (AUGMENTIN) 875-125 MG per tablet Take 1 tablet by mouth 2 (two) times daily. 12/14/14  Yes Delfino Lovett, MD  baclofen (LIORESAL) 10 MG tablet Take 5-10 mg by mouth 2 (two) times daily as needed for muscle spasms.   Yes Historical Provider, MD  benzonatate (TESSALON) 100 MG capsule Take 1 capsule (100 mg total) by mouth every 6 (six) hours as needed for cough. 11/22/14  Yes Auburn Bilberry, MD  budesonide-formoterol (SYMBICORT) 160-4.5 MCG/ACT inhaler Inhale 2 puffs into the lungs 2 (two) times daily.   Yes Historical Provider, MD  calcitRIOL (ROCALTROL) 0.25 MCG capsule Take 0.25 mcg by mouth daily.   Yes Historical Provider, MD  calcium acetate (PHOSLO) 667 MG capsule Take 2,001 mg by mouth 3 (three) times daily with meals.    Yes Historical Provider, MD   chlorpheniramine-HYDROcodone (TUSSIONEX) 10-8 MG/5ML SUER Take 5 mLs by mouth every 12 (twelve) hours as needed for cough. 11/22/14  Yes Auburn Bilberry, MD  cinacalcet (SENSIPAR) 60 MG tablet Take 60 mg by mouth daily.   Yes Historical Provider, MD  citalopram (CELEXA) 20 MG tablet Take 1 tablet (20 mg total) by mouth daily. 03/30/14  Yes [name], MD  cyanocobalamin (,VITAMIN B-12,) 1000 MCG/ML injection Inject 1,000 mcg into the muscle every 30 (thirty) days. Start 10/2014   Yes [name], MD  docusate sodium (COLACE) 100 MG capsule Take 1 capsule (  100 mg total) by mouth 2 (two) times daily. 11/22/14  Yes Auburn Bilberry, MD  donepezil (ARICEPT) 5 MG tablet Take 1 tablet (5 mg total) by mouth at bedtime. 11/01/14  Yes [name], MD  hydrOXYzine (ATARAX/VISTARIL) 25 MG tablet Take 12.5-25 mg by mouth 2 (two) times daily as needed for anxiety.   Yes Historical Provider, MD  lidocaine-prilocaine (EMLA) cream Apply 1 application topically as needed (topical anesthesia for hemodialysis if Gebauers and Lidocaine injection are ineffective.). 11/22/14  Yes Auburn Bilberry, MD  lovastatin (MEVACOR) 20 MG tablet Take 20 mg by mouth at bedtime.   Yes Historical Provider, MD  Multiple Vitamin (MULTIVITAMIN WITH MINERALS) TABS tablet Take 1 tablet by mouth daily. 11/22/14  Yes Auburn Bilberry, MD  Nutritional Supplements (FEEDING SUPPLEMENT, NEPRO CARB STEADY,) LIQD Take 237 mLs by mouth 2 (two) times daily between meals. 11/22/14  Yes Auburn Bilberry, MD  pantoprazole (PROTONIX) 40 MG tablet Take 40 mg by mouth daily as needed (for heartburn/indigestion).   Yes Historical Provider, MD  promethazine (PHENERGAN) 12.5 MG tablet Take 12.5 mg by mouth every 6 (six) hours as needed for nausea or vomiting.   Yes Historical Provider, MD  traZODone (DESYREL) 50 MG tablet Take 0.5-1 tablets (25-50 mg total) by mouth at bedtime. Patient taking differently: Take 25 mg by mouth at bedtime.  11/01/14  Yes [name], MD  zolpidem (AMBIEN) 5 MG tablet Take 1 tablet (5 mg total) by mouth at bedtime as needed for sleep. 11/22/14  Yes Auburn Bilberry, MD    REVIEW OF SYSTEMS:  Review of Systems  Constitutional: Positive for malaise/fatigue. Negative for fever, chills and weight loss.  HENT: Negative for ear pain, hearing loss and tinnitus.   Eyes: Negative for blurred vision, double vision, pain and redness.  Respiratory: Positive for shortness of breath. Negative for cough and hemoptysis.   Cardiovascular: Positive for leg swelling. Negative for chest pain, palpitations and orthopnea.  Gastrointestinal: Negative for nausea, vomiting, abdominal pain, diarrhea and constipation.  Genitourinary: Negative for dysuria, frequency and hematuria.  Musculoskeletal: Negative for back pain, joint pain and neck pain.  Skin:       No acne, rash, or lesions  Neurological: Positive for weakness. Negative for dizziness, tremors and focal weakness.  Endo/Heme/Allergies: Negative for polydipsia. Does not bruise/bleed easily.  Psychiatric/Behavioral: Negative for depression. The patient is not nervous/anxious and does not have insomnia.      VITAL SIGNS:   Filed Vitals:   12/20/14 1800 12/20/14 1930 12/20/14 2201 12/20/14 2230  BP: 148/86 128/106 132/75 131/78  Pulse: 91 94 97 101  Temp:      TempSrc:      Resp: 23 18 21 23   Height:      Weight:      SpO2: 93% 92% 98% 91%   Wt Readings from Last 3 Encounters:  12/20/14 99.599 kg (219 lb 9.2 oz)  12/14/14 100.3 kg (221 lb 1.9 oz)  11/22/14 98.521 kg (217 lb 3.2 oz)    PHYSICAL EXAMINATION:  Physical Exam  Vitals reviewed. Constitutional: She is oriented to person, place, and time. She appears well-developed and well-nourished. No distress.  HENT:  Head: Normocephalic and atraumatic.  Mouth/Throat: Oropharynx is clear and moist.  Eyes: Conjunctivae and EOM are normal. Pupils are equal, round, and reactive to light. No scleral icterus.  Neck: Normal  range of motion. Neck supple. No JVD present. No thyromegaly present.  Cardiovascular: Normal rate, regular rhythm and intact distal pulses.  Exam reveals  no gallop and no friction rub.   No murmur heard. Respiratory: Effort normal and breath sounds normal. No respiratory distress. She has no wheezes. She has no rales.  GI: Soft. Bowel sounds are normal. She exhibits no distension. There is no tenderness.  Musculoskeletal: Normal range of motion. She exhibits no edema.  No arthritis, no gout  Lymphadenopathy:    She has no cervical adenopathy.  Neurological: She is alert and oriented to person, place, and time. No cranial nerve deficit.  No dysarthria, no aphasia  Skin: Skin is warm and dry. No rash noted. No erythema.  Psychiatric: She has a normal mood and affect. Her behavior is normal. Judgment and thought content normal.    LABORATORY PANEL:   CBC  Recent Labs Lab 12/20/14 1733  WBC 7.3  HGB 8.2*  HCT 24.5*  PLT 240   ------------------------------------------------------------------------------------------------------------------  Chemistries   Recent Labs Lab 12/20/14 1733  NA 139  K 3.7  CL 97*  CO2 28  GLUCOSE 102*  BUN 22*  CREATININE 3.63*  CALCIUM 8.5*  AST 23  ALT 13*  ALKPHOS 79  BILITOT 0.4   ------------------------------------------------------------------------------------------------------------------  Cardiac Enzymes  Recent Labs Lab 12/20/14 1733  TROPONINI 0.04*   ------------------------------------------------------------------------------------------------------------------  RADIOLOGY:  Dg Chest 2 View  12/20/2014   CLINICAL DATA:  End-stage renal disease.  Difficulty breathing.  EXAM: CHEST  2 VIEW  COMPARISON:  PA and lateral chest 12/14/2014. Single view of the chest 11/12/2014 and CT chest 11/12/2014.  FINDINGS: There is cardiomegaly and vascular congestion. Small left pleural effusion is noted. Streaky left basilar airspace  disease is identified.  IMPRESSION: Small left pleural effusion streaky left basilar airspace disease which could be atelectasis or pneumonia.  Cardiomegaly and vascular congestion.   Electronically Signed   By: Drusilla Kanner M.D.   On: 12/20/2014 18:06    EKG:   Orders placed or performed during the hospital encounter of 12/20/14  . ED EKG  . ED EKG  . EKG 12-Lead  . EKG 12-Lead    IMPRESSION AND PLAN:  Principal Problem:   Acute on chronic respiratory failure with hypoxia - baseline COPD with multiple recurrent recent pneumonias in an end-stage renal disease patient with a tenuous fluid balance at baseline, who presents now with pulmonary edema and increased oxygen requirement. Initial workup in the ED does not seem to indicate infectious component, with a normal white blood cell count and no report of fevers or chills at home, or sputum production. respiratory issues likely due to acute pulmonary edema in the setting of diminished baseline reserve  Active Problems:   ESRD (end stage renal disease) on dialysis -  consult nephrology for dialysis support, patient may benefit from an extra dialysis session to pull some fluids but will defer to nephrology's recommendations for the same.  Patient does still make some urine and was given a dose of IV Lasix in the ED, we will repeat this dose one time overnight to see if we can get some fluid off he is her work of breathing acutely.   COPD (chronic obstructive pulmonary disease) - continue home inhalers, when necessary duo nebs while here.    HTN (hypertension) - currently controlled, continue home meds    Depression - Continue home meds   HLD (hyperlipidemia) - continue home meds    GERD (gastroesophageal reflux disease) - home dose PPI   All the records are reviewed and case discussed with ED provider. Management plans discussed with the patient and/or  family.  DVT PROPHYLAXIS: SubQ heparin  ADMISSION STATUS: Inpatient  CODE STATUS:  Full Advance Directive Documentation        Most Recent Value   Type of Advance Directive  Healthcare Power of Attorney   Pre-existing out of facility DNR order (yellow form or pink MOST form)     "MOST" Form in Place?        TOTAL TIME TAKING CARE OF THIS PATIENT: 50 minutes.    Alexismarie Flaim FIELDING 12/20/2014, 10:57 PM  [facility]  Office  [phone]  CC: Primary care physician; [name], MD

## 2014-12-20 NOTE — ED Notes (Signed)
Patient arrived by EMS from home. Patient was treated for pneumonia in July and has been in and out of the hospital since. Patient is currently a dialysis patient and had a treatment this morning that went well. Patient wears 2L at home of O2. EMS vitals 146/92 and 98.3oral temp

## 2014-12-20 NOTE — ED Notes (Addendum)
Pt became SOB transfering from toilet to bed. O2 stat dropped from 91% to 85% and pt became tachycardic with noticeable increase is distress. Pt now lying in bed attempting to catch breath. Pt is placed on a non-rebreather at this time in order to assist pt with catching breath.

## 2014-12-20 NOTE — ED Notes (Signed)
Pt up and sitting on the toilet. Family in with pt. Oxygen remaining on pt.

## 2014-12-21 ENCOUNTER — Inpatient Hospital Stay: Payer: Medicare Other

## 2014-12-21 ENCOUNTER — Ambulatory Visit: Payer: Medicare Other | Admitting: Family Medicine

## 2014-12-21 LAB — RENAL FUNCTION PANEL
Albumin: 3.2 g/dL — ABNORMAL LOW (ref 3.5–5.0)
Anion gap: 11 (ref 5–15)
BUN: 30 mg/dL — AB (ref 6–20)
CHLORIDE: 102 mmol/L (ref 101–111)
CO2: 29 mmol/L (ref 22–32)
CREATININE: 5.29 mg/dL — AB (ref 0.44–1.00)
Calcium: 8.3 mg/dL — ABNORMAL LOW (ref 8.9–10.3)
GFR calc Af Amer: 9 mL/min — ABNORMAL LOW (ref >60)
GFR, EST NON AFRICAN AMERICAN: 8 mL/min — AB (ref >60)
GLUCOSE: 147 mg/dL — AB (ref 65–99)
Phosphorus: 2.9 mg/dL (ref 2.5–4.6)
Potassium: 3.1 mmol/L — ABNORMAL LOW (ref 3.5–5.1)
Sodium: 142 mmol/L (ref 135–145)

## 2014-12-21 LAB — BASIC METABOLIC PANEL
Anion gap: 10 (ref 5–15)
BUN: 27 mg/dL — AB (ref 6–20)
CALCIUM: 8.3 mg/dL — AB (ref 8.9–10.3)
CO2: 31 mmol/L (ref 22–32)
CREATININE: 4.57 mg/dL — AB (ref 0.44–1.00)
Chloride: 100 mmol/L — ABNORMAL LOW (ref 101–111)
GFR calc non Af Amer: 9 mL/min — ABNORMAL LOW (ref >60)
GFR, EST AFRICAN AMERICAN: 10 mL/min — AB (ref >60)
Glucose, Bld: 119 mg/dL — ABNORMAL HIGH (ref 65–99)
Potassium: 3.4 mmol/L — ABNORMAL LOW (ref 3.5–5.1)
SODIUM: 141 mmol/L (ref 135–145)

## 2014-12-21 LAB — TROPONIN I: Troponin I: 0.04 ng/mL — ABNORMAL HIGH (ref <0.031)

## 2014-12-21 LAB — CBC
HCT: 22.4 % — ABNORMAL LOW (ref 35.0–47.0)
HCT: 22.6 % — ABNORMAL LOW (ref 35.0–47.0)
Hemoglobin: 7.5 g/dL — ABNORMAL LOW (ref 12.0–16.0)
Hemoglobin: 7.6 g/dL — ABNORMAL LOW (ref 12.0–16.0)
MCH: 34.1 pg — AB (ref 26.0–34.0)
MCH: 34.5 pg — AB (ref 26.0–34.0)
MCHC: 33.4 g/dL (ref 32.0–36.0)
MCHC: 33.7 g/dL (ref 32.0–36.0)
MCV: 102 fL — ABNORMAL HIGH (ref 80.0–100.0)
MCV: 102.4 fL — AB (ref 80.0–100.0)
PLATELETS: 205 10*3/uL (ref 150–440)
PLATELETS: 205 10*3/uL (ref 150–440)
RBC: 2.2 MIL/uL — AB (ref 3.80–5.20)
RBC: 2.21 MIL/uL — ABNORMAL LOW (ref 3.80–5.20)
RDW: 18.2 % — AB (ref 11.5–14.5)
RDW: 18.3 % — AB (ref 11.5–14.5)
WBC: 6 10*3/uL (ref 3.6–11.0)
WBC: 5.9 10*3/uL (ref 3.6–11.0)

## 2014-12-21 LAB — MRSA PCR SCREENING: MRSA BY PCR: NEGATIVE

## 2014-12-21 MED ORDER — DONEPEZIL HCL 5 MG PO TABS
5.0000 mg | ORAL_TABLET | Freq: Every day | ORAL | Status: DC
  Administered 2014-12-21 (×2): 5 mg via ORAL
  Filled 2014-12-21 (×2): qty 1

## 2014-12-21 MED ORDER — ONDANSETRON HCL 4 MG PO TABS: 4.0000 mg | ORAL_TABLET | Freq: Four times a day (QID) | ORAL | Status: DC | PRN

## 2014-12-21 MED ORDER — TRAZODONE HCL 50 MG PO TABS
25.0000 mg | ORAL_TABLET | Freq: Every day | ORAL | Status: DC
  Administered 2014-12-21 (×2): 25 mg via ORAL
  Filled 2014-12-21 (×2): qty 1

## 2014-12-21 MED ORDER — SODIUM CHLORIDE 0.9 % IJ SOLN
3.0000 mL | Freq: Two times a day (BID) | INTRAMUSCULAR | Status: DC
  Administered 2014-12-21 – 2014-12-22 (×4): 3 mL via INTRAVENOUS

## 2014-12-21 MED ORDER — LIDOCAINE-PRILOCAINE 2.5-2.5 % EX CREA: 1.0000 "application " | TOPICAL_CREAM | CUTANEOUS | Status: DC | PRN

## 2014-12-21 MED ORDER — ALTEPLASE 2 MG IJ SOLR
2.0000 mg | Freq: Once | INTRAMUSCULAR | Status: DC | PRN
  Filled 2014-12-21: qty 2

## 2014-12-21 MED ORDER — BUDESONIDE-FORMOTEROL FUMARATE 160-4.5 MCG/ACT IN AERO
2.0000 | INHALATION_SPRAY | Freq: Two times a day (BID) | RESPIRATORY_TRACT | Status: DC
  Administered 2014-12-21 – 2014-12-22 (×4): 2 via RESPIRATORY_TRACT
  Filled 2014-12-21: qty 6

## 2014-12-21 MED ORDER — CITALOPRAM HYDROBROMIDE 20 MG PO TABS
20.0000 mg | ORAL_TABLET | Freq: Every day | ORAL | Status: DC
  Administered 2014-12-21 – 2014-12-22 (×2): 20 mg via ORAL
  Filled 2014-12-21 (×2): qty 1

## 2014-12-21 MED ORDER — SODIUM CHLORIDE 0.9 % IV SOLN: 100.0000 mL | INTRAVENOUS | Status: DC | PRN

## 2014-12-21 MED ORDER — CALCITRIOL 0.25 MCG PO CAPS
0.2500 ug | ORAL_CAPSULE | Freq: Every day | ORAL | Status: DC
  Administered 2014-12-21 – 2014-12-22 (×2): 0.25 ug via ORAL
  Filled 2014-12-21 (×2): qty 1

## 2014-12-21 MED ORDER — ACETAMINOPHEN 325 MG PO TABS
650.0000 mg | ORAL_TABLET | Freq: Four times a day (QID) | ORAL | Status: DC | PRN
  Administered 2014-12-21: 650 mg via ORAL
  Filled 2014-12-21: qty 2

## 2014-12-21 MED ORDER — HEPARIN SODIUM (PORCINE) 5000 UNIT/ML IJ SOLN
5000.0000 [IU] | Freq: Three times a day (TID) | INTRAMUSCULAR | Status: DC
  Administered 2014-12-21 – 2014-12-22 (×4): 5000 [IU] via SUBCUTANEOUS
  Filled 2014-12-21 (×5): qty 1

## 2014-12-21 MED ORDER — PENTAFLUOROPROP-TETRAFLUOROETH EX AERO: 1.0000 "application " | INHALATION_SPRAY | CUTANEOUS | Status: DC | PRN

## 2014-12-21 MED ORDER — PRAVASTATIN SODIUM 20 MG PO TABS
20.0000 mg | ORAL_TABLET | Freq: Every day | ORAL | Status: DC
  Administered 2014-12-21: 20 mg via ORAL
  Filled 2014-12-21: qty 1

## 2014-12-21 MED ORDER — ONDANSETRON HCL 4 MG/2ML IJ SOLN: 4.0000 mg | Freq: Four times a day (QID) | INTRAMUSCULAR | Status: DC | PRN

## 2014-12-21 MED ORDER — ACETAMINOPHEN 650 MG RE SUPP: 650.0000 mg | Freq: Four times a day (QID) | RECTAL | Status: DC | PRN

## 2014-12-21 MED ORDER — NEPRO/CARBSTEADY PO LIQD
237.0000 mL | Freq: Two times a day (BID) | ORAL | Status: DC
  Administered 2014-12-21 – 2014-12-22 (×3): 237 mL via ORAL

## 2014-12-21 MED ORDER — HYDROXYZINE HCL 25 MG PO TABS: 12.5000 mg | ORAL_TABLET | Freq: Two times a day (BID) | ORAL | Status: DC | PRN

## 2014-12-21 MED ORDER — PANTOPRAZOLE SODIUM 40 MG PO TBEC: 40.0000 mg | DELAYED_RELEASE_TABLET | Freq: Every day | ORAL | Status: DC | PRN

## 2014-12-21 MED ORDER — ALPRAZOLAM 1 MG PO TABS
1.0000 mg | ORAL_TABLET | Freq: Three times a day (TID) | ORAL | Status: DC
  Administered 2014-12-21 – 2014-12-22 (×5): 1 mg via ORAL
  Filled 2014-12-21 (×5): qty 1

## 2014-12-21 MED ORDER — LIDOCAINE HCL (PF) 1 % IJ SOLN
5.0000 mL | INTRAMUSCULAR | Status: DC | PRN
  Filled 2014-12-21: qty 5

## 2014-12-21 MED ORDER — IPRATROPIUM-ALBUTEROL 0.5-2.5 (3) MG/3ML IN SOLN: 3.0000 mL | RESPIRATORY_TRACT | Status: DC | PRN

## 2014-12-21 MED ORDER — FUROSEMIDE 10 MG/ML IJ SOLN
20.0000 mg | Freq: Once | INTRAMUSCULAR | Status: AC
  Administered 2014-12-21: 20 mg via INTRAVENOUS
  Filled 2014-12-21: qty 2

## 2014-12-21 MED ORDER — HEPARIN SODIUM (PORCINE) 1000 UNIT/ML IJ SOLN
1000.0000 [IU] | INTRAMUSCULAR | Status: DC | PRN
  Filled 2014-12-21: qty 1

## 2014-12-21 NOTE — Progress Notes (Signed)
[facility] - [place] at [facility]   PATIENT NAME: Ariel Smith    MR#:  [id number]  DATE OF BIRTH:  02-17-1946  SUBJECTIVE:  CHIEF COMPLAINT:   Chief Complaint  Patient presents with  . Shortness of Breath   SOB and needing more O2. Watches her fluid and salt intake. REVIEW OF SYSTEMS:  Review of Systems  Constitutional: Negative for fever, weight loss, malaise/fatigue and diaphoresis.  HENT: Negative for ear discharge, ear pain, hearing loss, nosebleeds, sore throat and tinnitus.   Eyes: Negative for blurred vision and pain.  Respiratory: Positive for shortness of breath. Negative for cough, hemoptysis and wheezing.   Cardiovascular: Negative for chest pain, palpitations, orthopnea and leg swelling.  Gastrointestinal: Negative for heartburn, nausea, vomiting, abdominal pain, diarrhea, constipation and blood in stool.  Genitourinary: Negative for dysuria, urgency and frequency.  Musculoskeletal: Negative for myalgias and back pain.  Skin: Negative for itching and rash.  Neurological: Negative for dizziness, tingling, tremors, focal weakness, seizures, weakness and headaches.  Psychiatric/Behavioral: Negative for depression. The patient is not nervous/anxious.    DRUG ALLERGIES:   Allergies  Allergen Reactions  . Aspirin Other (See Comments)    Due to kidney disease  . Other Other (See Comments)    Pt states that she is allergic to Midrin and it causes headaches.    VITALS:  Blood pressure 125/98, pulse 95, temperature 97.9 F (36.6 C), temperature source Oral, resp. rate 20, height 5\' 4"  (1.626 m), weight 98.34 kg (216 lb 12.8 oz), SpO2 93 %. PHYSICAL EXAMINATION:  Physical Exam  Constitutional: She is oriented to person, place, and time and well-developed, well-nourished, and in no distress.  HENT:  Head: Normocephalic and atraumatic.  Eyes: Conjunctivae and EOM are normal. Pupils are equal, round, and reactive to light.  Neck: Normal range  of motion. Neck supple. No tracheal deviation present. No thyromegaly present.  Cardiovascular: Normal rate, regular rhythm and normal heart sounds.   Pulmonary/Chest: No respiratory distress. She has no wheezes. She has no rales. She exhibits no tenderness.  Abdominal: Soft. Bowel sounds are normal. She exhibits no distension. There is no tenderness.  Musculoskeletal: Normal range of motion.  Neurological: She is alert and oriented to person, place, and time. No cranial nerve deficit.  Skin: Skin is warm and dry. No rash noted.  Psychiatric: Mood and affect normal.   LABORATORY PANEL:   CBC  Recent Labs Lab 12/21/14 0410  WBC 6.0  HGB 7.5*  HCT 22.4*  PLT 205   ------------------------------------------------------------------------------------------------------------------ Chemistries   Recent Labs Lab 12/20/14 1733 12/21/14 0410  NA 139 141  K 3.7 3.4*  CL 97* 100*  CO2 28 31  GLUCOSE 102* 119*  BUN 22* 27*  CREATININE 3.63* 4.57*  CALCIUM 8.5* 8.3*  AST 23  --   ALT 13*  --   ALKPHOS 79  --   BILITOT 0.4  --    RADIOLOGY:  Dg Chest 2 View  12/20/2014   CLINICAL DATA:  End-stage renal disease.  Difficulty breathing.  EXAM: CHEST  2 VIEW  COMPARISON:  PA and lateral chest 12/14/2014. Single view of the chest 11/12/2014 and CT chest 11/12/2014.  FINDINGS: There is cardiomegaly and vascular congestion. Small left pleural effusion is noted. Streaky left basilar airspace disease is identified.  IMPRESSION: Small left pleural effusion streaky left basilar airspace disease which could be atelectasis or pneumonia.  Cardiomegaly and vascular congestion.   Electronically Signed   By: Drusilla Kanner M.D.  On: 12/20/2014 18:06   Portable Chest 1 View  12/21/2014   CLINICAL DATA:  Respiratory failure, followup  EXAM: PORTABLE CHEST - 1 VIEW  COMPARISON:  Chest x-ray of 12/20/2014  FINDINGS: The lungs remain poorly aerated with indistinct perihilar vasculature suggesting mild  pulmonary vascular congestion. No definite pleural effusion is seen. Cardiomegaly is stable.  IMPRESSION: Cardiomegaly and probable worsening of pulmonary vascular congestion.   Electronically Signed   By: Dwyane Dee M.D.   On: 12/21/2014 07:59   ASSESSMENT AND PLAN:  69 year old woman with history of multiple medical problems including COPD on home oxygen 3 L/m, end-stage renal disease on hemodialysis, recent hospital admission 5 weeks ago with GI bleed and pneumonia, on DO NOT RESUSCITATE status, brought in with the complaints of acute respiratory distress with hypoxia, diagnosed to have acute and chronic respiratory failure with hypoxia and CHF  1. Acute on chronic respiratory failure with hypoxia secondary to combination of COPD and acute on chronic CHF.  2. Recent Right lower lobe pneumonia, HCAP.  Afebrile and has normal white count. This is resolved.  3. COPD  No wheezing. Continue home inhalers and nebulizers when necessary.  4. End-stage renal disease on hemodialysis- Discussed with Dr. Cherylann Ratel. Extra dialysis today for removal of fluid. She is to get dialysis again tomorrow as per her outpatient schedule.  5. Chronic anemia, recent GI bleed. H&H low but stable. Monitor H&H closely, continue iron supplementation.  6. History of DVT status post IVC filter placement, not on Coumadin because of recent GI bleed.  7. Depression, stable on home medications. Continue same.  Possible discharge tomorrow after dialysis if patient is feeling better with her trouble breathing.  All the records are reviewed and case discussed with Care Management/Social Worker. Management plans discussed with the patient, family and they are in agreement.  CODE STATUS: Full code  TOTAL TIME TAKING CARE OF THIS PATIENT: 35 minutes.   More than 50% of the time was spent in counseling/coordination of care: YES  POSSIBLE D/C IN 1-2 DAYS, DEPENDING ON CLINICAL CONDITION.   Milagros Loll R M.D on 12/21/2014  at 12:26 PM  Between 7am to 6pm - Pager - [phone]  After 6pm go to [email or web address] - password [email or web address] [facility]  [facility]  Office  [phone]  CC:  Primary care physician; [name], MD

## 2014-12-21 NOTE — Care Management (Signed)
Patient presents from home.  She was seen by her home health nurse and was experiencing shortness of breath.  Primary care physician was called and instructed to have patient patient proceed to the ED.  CXR showed pulmonary edema.  Patient has been compliant with her HD regime, diet, and mediations.  Spoke with nephrology today and states that patient is in need of additional hemodialysis treatment today in addition to her usual routine session tomorrow prior to her discharge.  There is concern that her symptoms may be complicated by her aortic valve stenosis and feel it is in the best interest of the patient to have the session on 9/3 in a closer supervised setting. Left voicemail for [facility] to notify of discharge home tomorrow and requested home visit within 24 hours of discharge.  This patient is being seen under [facility].  She does have chronic home 02.

## 2014-12-21 NOTE — Progress Notes (Signed)
Initial Nutrition Assessment  INTERVENTION:   Meals and Snacks: Cater to patient preferences on renal diet Medical Food Supplement Therapy: will recommend Nepro Shake po BID, each supplement provides 425 kcal and 19 grams protein   NUTRITION DIAGNOSIS:   Inadequate oral intake related to poor appetite as evidenced by per patient/family report, per [facility]  GOAL:   Patient will meet greater than or equal to 90% of their needs  MONITOR:    (Energy Intake, Electrolyte and nreal Profile, Digestive System, Anthropometrics, UOP)  REASON FOR ASSESSMENT:    (Renal diet, Dialysis)    ASSESSMENT:   Pt admitted with SOB with acute on chronic respiratory failure. Pt with h/o ESRD on HD. Pt out of room on visit as pt headed to HD.  Past Medical History  Diagnosis Date  . Osteoarthritis   . Depression   . Frequent headaches   . HTN (hypertension)   . HLD (hyperlipidemia)   . History of colon polyps 2013    colonoscopy ([name])  . 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 Systolic murmur     mild  . GERD (gastroesophageal reflux disease)   . COPD (chronic obstructive pulmonary disease)   . ESRD (end stage renal disease) 08/2011    TThSa, L forearm AV fistula, [name]  . Secondary hyperparathyroidism of renal origin   . Osteopenia 01/2013  . Bronchiectasis 08/2014     suggested by thoracic xray  . Anemia   . Nonrheumatic aortic valve stenosis     unspecified  . DVT (deep venous thrombosis)     Chronic     Diet Order:  Diet renal with fluid restriction Fluid restriction:: 1200 mL Fluid; Room service appropriate?: Yes; Fluid consistency:: Thin    Current Nutrition: Pt ate 100% of breakfast this am.   Food/Nutrition-Related History: Per MST pt reported decrease in appetite PTA. Pt receiving Nepro BID on previous admissions. RD notes also in MD note, pt reports watching salt and fluid  intake.   Medications: reviewed  Electrolyte/Renal Profile and Glucose Profile:   Recent Labs Lab 12/20/14 1733 12/21/14 0410  NA 139 141  K 3.7 3.4*  CL 97* 100*  CO2 28 31  BUN 22* 27*  CREATININE 3.63* 4.57*  CALCIUM 8.5* 8.3*  GLUCOSE 102* 119*   Protein Profile:  Recent Labs Lab 12/20/14 1733  ALBUMIN 3.4*    Gastrointestinal Profile: Last BM: 12/21/2014   Nutrition-Focused Physical Exam Findings:  Unable to complete Nutrition-Focused physical exam at this time.    Weight Change: Pt weight last week 225lbs. Per MD note pt reported 'may have gained weight.' Pt weight relatively stable per [facility].  Height:   Ht Readings from Last 1 Encounters:  12/20/14  (1.626 m)    Weight:   Wt Readings from Last 1 Encounters:  12/21/14 216 lb 12.8 oz (98.34 kg)   Wt Readings from Last 10 Encounters:  12/21/14 216 lb 12.8 oz (98.34 kg)  12/14/14 221 lb 1.9 oz (100.3 kg)  11/22/14 217 lb 3.2 oz (98.521 kg)  11/01/14 217 lb 4 oz (98.544 kg)  08/17/14 208 lb 8 oz (94.575 kg)  03/30/14 209 lb (94.802 kg)  12/04/13 200 lb 12 oz (91.06 kg)  08/02/13 214 lb (97.07 kg)  01/27/13 219 lb 4 oz (99.451 kg)  10/20/12 225 lb (102.059 kg)    Ideal Body Weight:  54.5kg  BMI:  Body mass index is 37.2 kg/(m^2).  Estimated Nutritional Needs:   Kcal:  BEE: 1080kcals, TEE: (IF 1.2-1.4)(AF 1.3) 1653-1929kcals, using IBW of 54.5kg  Protein:  65-82g protein (1.2-1.5g/kg) using IBW of 54.5kg  Fluid:  UOP+1026mL  EDUCATION NEEDS:   No education needs identified at this time   MODERATE Care Level  Leda Quail, RD, LDN Pager [phone]

## 2014-12-21 NOTE — Progress Notes (Signed)
PRE HD   12/21/14 1430  Vital Signs  Temp 98.7 F (37.1 C)  Temp Source Other  Pulse Rate 95  Pulse Rate Source Monitor  Resp (!) 35  BP (!) 147/76 mmHg  BP Location Right Arm  BP Method Automatic  Patient Position (if appropriate) Lying  Oxygen Therapy  SpO2 96 %  O2 Device Nasal Cannula  O2 Flow Rate (L/min) 2 L/min  Pain Assessment  Pain Assessment No/denies pain  Pain Score 0  Dialysis Weight  Weight 100.4 kg (221 lb 5.5 oz)  Type of Weight Pre-Dialysis  Time-Out for Hemodialysis  What Procedure? HEMODIALYSIS  Pt Identifiers(min of two) First/Last Name;MRN/Account#  Correct Site? Yes  Correct Side? Yes  Correct Procedure? Yes  Consents Verified? Yes  Rad Studies Available? N/A  Safety Precautions Reviewed? Yes  Machine [employer] Number [id number]  Station Number 2  UF/Alarm Test Passed  Conductivity: Meter 13.8  Conductivity: Machine  13.8  pH 7.4  Reverse Osmosis MAIN  Dialyzer Lot Number [id number]  Disposable Set Lot Number [id number]  Machine Temperature 98.6 F (37 C)  Immunologist and Audible Yes  Blood Lines Intact and Secured Yes  Pre Treatment Patient Checks  Vascular access used during treatment Fistula  Hepatitis B Surface Antigen Results Negative  Date Hepatitis B Surface Antigen Drawn 12/12/14  Hepatitis B Surface Antibody 10.8  Date Hepatitis B Surface Antibody Drawn 11/11/14  Hemodialysis Consent Verified Yes  Hemodialysis Standing Orders Initiated Yes  ECG (Telemetry) Monitor On Yes  Prime Ordered Normal Saline  Length of  DialysisTreatment -hour(s) 3 Hour(s)  Dialysis Treatment Comments GOAL TO REMOVE 1.5L OVER 3 HOURS.  PT ALERT AND VS STABLE.  15G NEEDLES INSERTED WITHOUT DIFFICULTY.    Dialyzer Optiflux 180 NR  Dialysate 2K, 2.5 Ca  Dialysis Anticoagulant None  Dialysate Flow Ordered 600  Blood Flow Rate Ordered 400 mL/min  Ultrafiltration Goal 1.5 Liters  Pre Treatment Labs Renal panel;CBC;Phosphorus  Dialysis Blood  Pressure Support Ordered Normal Saline  Education / Care Plan  Hemodialysis Education Provided Yes  Documented Education in Clinical Pathway Yes  Fistula / Graft Left Forearm Arteriovenous fistula  No Placement Date or Time found.   Placed prior to admission: Yes  Orientation: Left  Access Location: Forearm  Access Type: Arteriovenous fistula  Site Condition No complications  Fistula / Graft Assessment Present;Thrill;Bruit  Status Accessed  Needle Size 15  Drainage Description None

## 2014-12-21 NOTE — Care Management Note (Addendum)
Patient is active at [facility] [address] TTS 1st shift. I have sent admission records to clinic and will update with discharge records when it occurs. Ivor Reining Dialysis Liaison [phone]

## 2014-12-21 NOTE — Progress Notes (Signed)
Subjective: Pt seen at bedside. Well known to [place] from outpt HD center. Presents now with shortness of breath. Found to have pulmonary edema. Denies excessive fluid or salt intake.  Adherent with HD schedule per report. May have gained weight per her report.   Objective: Vital signs in last 24 hours: Temp:  [97.9 F (36.6 C)-98 F (36.7 C)] 97.9 F (36.6 C) (09/02 0533) Pulse Rate:  [91-102] 95 (09/02 1133) Resp:  [14-24] 20 (09/02 1133) BP: (125-158)/(69-106) 125/98 mmHg (09/02 1133) SpO2:  [91 %-98 %] 93 % (09/02 1133) Weight:  [98.34 kg (216 lb 12.8 oz)-99.599 kg (219 lb 9.2 oz)] 98.34 kg (216 lb 12.8 oz) (09/02 0500) Weight change:   Intake/Output from previous day:  Intake/Output Summary (Last 24 hours) at 12/21/14 1155 Last data filed at 12/21/14 0929  Gross per 24 hour  Intake    243 ml  Output     76 ml  Net    167 ml    Intake/Output this shift: Total I/O In: 240 [P.O.:240] Out: 0   General appearance: NAD HEENT: Atraumatic; EOMI, hearing intact, OM moist Neck: Trachea midline; supple Lungs: basilar rales, with normal respiratory effort  CV: S1S2, no obvious rub Abdomen: Soft, NTND, BS present Extremities: trace + b/l LE edema Skin: Warm and dry, no rashes noted. Neuro/Psych: Awake, alert, following commands, normal mood  Lab Results: Results for orders placed or performed during the hospital encounter of 12/20/14 (from the past 24 hour(s))  Comprehensive metabolic panel     Status: Abnormal   Collection Time: 12/20/14  5:33 PM  Result Value Ref Range   Sodium 139 135 - 145 mmol/L   Potassium 3.7 3.5 - 5.1 mmol/L   Chloride 97 (L) 101 - 111 mmol/L   CO2 28 22 - 32 mmol/L   Glucose, Bld 102 (H) 65 - 99 mg/dL   BUN 22 (H) 6 - 20 mg/dL   Creatinine, Ser 1.61 (H) 0.44 - 1.00 mg/dL   Calcium 8.5 (L) 8.9 - 10.3 mg/dL   Total Protein 6.4 (L) 6.5 - 8.1 g/dL   Albumin 3.4 (L) 3.5 - 5.0 g/dL   AST 23 15 - 41 U/L   ALT 13 (L) 14 - 54 U/L   Alkaline  Phosphatase 79 38 - 126 U/L   Total Bilirubin 0.4 0.3 - 1.2 mg/dL   GFR calc non Af Amer 12 (L) >60 mL/min   GFR calc Af Amer 14 (L) >60 mL/min   Anion gap 14 5 - 15  Brain natriuretic peptide     Status: Abnormal   Collection Time: 12/20/14  5:33 PM  Result Value Ref Range   B Natriuretic Peptide 2108.0 (H) 0.0 - 100.0 pg/mL  Troponin I     Status: Abnormal   Collection Time: 12/20/14  5:33 PM  Result Value Ref Range   Troponin I 0.04 (H) <0.031 ng/mL  CBC with Differential     Status: Abnormal   Collection Time: 12/20/14  5:33 PM  Result Value Ref Range   WBC 7.3 3.6 - 11.0 K/uL   RBC 2.40 (L) 3.80 - 5.20 MIL/uL   Hemoglobin 8.2 (L) 12.0 - 16.0 g/dL   HCT 09.6 (L) 04.5 - 40.9 %   MCV 102.1 (H) 80.0 - 100.0 fL   MCH 34.0 26.0 - 34.0 pg   MCHC 33.3 32.0 - 36.0 g/dL   RDW 81.1 (H) 91.4 - 78.2 %   Platelets 240 150 - 440 K/uL   Neutrophils Relative %  74 %   Neutro Abs 5.4 1.4 - 6.5 K/uL   Lymphocytes Relative 14 %   Lymphs Abs 1.0 1.0 - 3.6 K/uL   Monocytes Relative 10 %   Monocytes Absolute 0.7 0.2 - 0.9 K/uL   Eosinophils Relative 1 %   Eosinophils Absolute 0.1 0 - 0.7 K/uL   Basophils Relative 1 %   Basophils Absolute 0.1 0 - 0.1 K/uL  Troponin I     Status: Abnormal   Collection Time: 12/20/14 11:40 PM  Result Value Ref Range   Troponin I 0.04 (H) <0.031 ng/mL  MRSA PCR Screening     Status: None   Collection Time: 12/21/14  2:13 AM  Result Value Ref Range   MRSA by PCR NEGATIVE NEGATIVE  Basic metabolic panel     Status: Abnormal   Collection Time: 12/21/14  4:10 AM  Result Value Ref Range   Sodium 141 135 - 145 mmol/L   Potassium 3.4 (L) 3.5 - 5.1 mmol/L   Chloride 100 (L) 101 - 111 mmol/L   CO2 31 22 - 32 mmol/L   Glucose, Bld 119 (H) 65 - 99 mg/dL   BUN 27 (H) 6 - 20 mg/dL   Creatinine, Ser 2.11 (H) 0.44 - 1.00 mg/dL   Calcium 8.3 (L) 8.9 - 10.3 mg/dL   GFR calc non Af Amer 9 (L) >60 mL/min   GFR calc Af Amer 10 (L) >60 mL/min   Anion gap 10 5 - 15  CBC      Status: Abnormal   Collection Time: 12/21/14  4:10 AM  Result Value Ref Range   WBC 6.0 3.6 - 11.0 K/uL   RBC 2.20 (L) 3.80 - 5.20 MIL/uL   Hemoglobin 7.5 (L) 12.0 - 16.0 g/dL   HCT 17.3 (L) 56.7 - 01.4 %   MCV 102.0 (H) 80.0 - 100.0 fL   MCH 34.1 (H) 26.0 - 34.0 pg   MCHC 33.4 32.0 - 36.0 g/dL   RDW 10.3 (H) 01.3 - 14.3 %   Platelets 205 150 - 440 K/uL    Studies/Results: Dg Chest 2 View  12/20/2014   CLINICAL DATA:  End-stage renal disease.  Difficulty breathing.  EXAM: CHEST  2 VIEW  COMPARISON:  PA and lateral chest 12/14/2014. Single view of the chest 11/12/2014 and CT chest 11/12/2014.  FINDINGS: There is cardiomegaly and vascular congestion. Small left pleural effusion is noted. Streaky left basilar airspace disease is identified.  IMPRESSION: Small left pleural effusion streaky left basilar airspace disease which could be atelectasis or pneumonia.  Cardiomegaly and vascular congestion.   Electronically Signed   By: Drusilla Kanner M.D.   On: 12/20/2014 18:06   Portable Chest 1 View  12/21/2014   CLINICAL DATA:  Respiratory failure, followup  EXAM: PORTABLE CHEST - 1 VIEW  COMPARISON:  Chest x-ray of 12/20/2014  FINDINGS: The lungs remain poorly aerated with indistinct perihilar vasculature suggesting mild pulmonary vascular congestion. No definite pleural effusion is seen. Cardiomegaly is stable.  IMPRESSION: Cardiomegaly and probable worsening of pulmonary vascular congestion.   Electronically Signed   By: Dwyane Dee M.D.   On: 12/21/2014 07:59    . ALPRAZolam  1 mg Oral TID  . budesonide-formoterol  2 puff Inhalation BID  . calcitRIOL  0.25 mcg Oral Daily  . citalopram  20 mg Oral Daily  . donepezil  5 mg Oral QHS  . heparin  5,000 Units Subcutaneous 3 times per day  . pravastatin  20 mg Oral q1800  .  sodium chloride  3 mL Intravenous Q12H  . traZODone  25 mg Oral QHS    Assessment/Plan: 69 year old female with past medical history of ESRD, SHPTH, AOCD, HTN,  hyperlipidemia, COPD, DVT with PE, history of tx with long term coumadin and IVC filter placement, L knee TKA, R shoulder surgery, IVC fileer removed Aug 2014, pnemonia 8/16, admitted with shortness of breath 9/16.  1.  ESRD on HD TTHS:  Pulmonary vascular congestion noted on chest x-ray yesterday and today.  Patient doesn't appear to be in need of additional volume removal.  Therefore we will plan for extra hemodialysis treatment today and we'll resume her normal schedule tomorrow as well.  We will like to determine her response to dialysis tomorrow prior to discharge.  She has been requiring oxygen recently as well.  2.  Pulmonary edema.  Noted on chest x-ray.  We will plan for ultrafiltration as above today and tomorrow.  3.  Anemia of chronic kidney disease.  Hemoglobin currently down to 7.5.  This may be playing some role in her shortness of breath.  We will start the patient on Epogen 10,000 units IV with hemodialysis.  4.  Secondary hyperparathyroidism.  Patient will continue on calcitriol 0.25 g by mouth daily.  We will check PTH as well as phosphorus today.  5.  Thanks for consult.    LOS: 1 day   Ariel Smith 12/21/2014,11:55 AM

## 2014-12-21 NOTE — Progress Notes (Signed)
POST HD   12/21/14 1800  Report  Report Received From BRANDY MANSFIELD, RN  Vital Signs  Temp 97.9 F (36.6 C)  Temp Source Axillary  Pulse Rate (!) 101  Pulse Rate Source Monitor  Resp (!) 24  BP (!) 142/76 mmHg  BP Location Right Arm  BP Method Automatic  Patient Position (if appropriate) Lying  Dialysis Weight  Weight 100 kg (220 lb 7.4 oz)  Type of Weight Post-Dialysis  During Hemodialysis Assessment  Intra-Hemodialysis Comments HD TREATMENT END.  GOAL MET.  BLOOD RETURNED AND 15G NEEDLES REMOVED PER POLICY.  PT ALERT AND VS STABLE.   Post-Hemodialysis Assessment  Rinseback Volume (mL) 250 mL  Dialyzer Clearance Lightly streaked  Duration of HD Treatment -hour(s) 3 hour(s)  Hemodialysis Intake (mL) 500 mL  UF Total -Machine (mL) 2000 mL  Net UF (mL) 1500 mL  Tolerated HD Treatment Yes  Post-Hemodialysis Comments HD TREATMENT END.  GOAL MET.  BLOOD RETURNED AND 15G NEEDLES REMOVED PER POLICY.  PT ALERT AND VS STABLE.   AVG/AVF Arterial Site Held (minutes) 7 minutes  AVG/AVF Venous Site Held (minutes) 7 minutes  Education / Care Plan  Hemodialysis Education Provided Yes  Documented Education in Clinical Pathway Yes  Fistula / Graft Left Forearm Arteriovenous fistula  No Placement Date or Time found.   Placed prior to admission: Yes  Orientation: Left  Access Location: Forearm  Access Type: Arteriovenous fistula  Site Condition No complications  Fistula / Graft Assessment Present;Thrill;Bruit  Status Deaccessed  Needle Size 15  Drainage Description None

## 2014-12-21 NOTE — Progress Notes (Signed)
POST HD   12/21/14 1800  Neurological  Level of Consciousness Alert  Orientation Level Oriented X4  Respiratory  Respiratory Pattern Dyspnea with exertion  Chest Assessment Chest expansion symmetrical  Bilateral Breath Sounds Clear;Diminished  Cough Non-productive  Cardiac  Pulse Regular  Heart Sounds Murmur  ECG Monitor Yes  Cardiac Rhythm NSR  Vascular  Edema Left lower extremity  Generalized Edema Other (Comment) (NON PITTING)  RUE Edema Non-pitting  LUE Edema Non-pitting  RLE Edema Non-pitting  LLE Edema +1  Integumentary  Integumentary (WDL) X  Skin Color Appropriate for ethnicity  Skin Condition Dry  Skin Integrity Ecchymosis  Ecchymosis Location Arm  Ecchymosis Location Orientation Right  Rash Location Thigh  Rash Location Orientation Bilateral  Musculoskeletal  Musculoskeletal (WDL) X  Generalized Weakness Yes  Assistive Device BSC;Standard walker  Gastrointestinal  Bowel Sounds Assessment Active  GU Assessment  Genitourinary (WDL) X  Genitourinary Symptoms Other (Comment) (HD)  Psychosocial  Psychosocial (WDL) WDL

## 2014-12-21 NOTE — Progress Notes (Signed)
PRE HD   12/21/14 1430  Neurological  Level of Consciousness Alert  Orientation Level Oriented X4  Respiratory  Respiratory Pattern Dyspnea with exertion  Chest Assessment Chest expansion symmetrical  Bilateral Breath Sounds Clear;Diminished  Cough Non-productive  Cardiac  Pulse Regular  Heart Sounds Murmur  ECG Monitor Yes  Cardiac Rhythm NSR  Vascular  Edema Left lower extremity  Generalized Edema Other (Comment) (NON PITTING)  RUE Edema Non-pitting  LUE Edema Non-pitting  RLE Edema Non-pitting  LLE Edema +1  Integumentary  Integumentary (WDL) X  Skin Color Appropriate for ethnicity  Skin Condition Dry  Skin Integrity Ecchymosis  Ecchymosis Location Arm  Ecchymosis Location Orientation Right  Rash Location Thigh  Rash Location Orientation Bilateral  Musculoskeletal  Musculoskeletal (WDL) X  Generalized Weakness Yes  Assistive Device BSC;Standard walker  Gastrointestinal  Bowel Sounds Assessment Active  GU Assessment  Genitourinary (WDL) X  Genitourinary Symptoms Other (Comment);Oliguria (HD)  Psychosocial  Psychosocial (WDL) WDL

## 2014-12-22 LAB — PARATHYROID HORMONE, INTACT (NO CA): PTH: 146 pg/mL — AB (ref 15–65)

## 2014-12-22 LAB — HEMOGLOBIN: HEMOGLOBIN: 7.9 g/dL — AB (ref 12.0–16.0)

## 2014-12-22 MED ORDER — GUAIFENESIN 100 MG/5ML PO SYRP: 200.0000 mg | ORAL_SOLUTION | Freq: Four times a day (QID) | ORAL | Status: DC | PRN

## 2014-12-22 MED ORDER — GUAIFENESIN 100 MG/5ML PO SYRP
200.0000 mg | ORAL_SOLUTION | Freq: Four times a day (QID) | ORAL | Status: DC | PRN
  Administered 2014-12-22: 200 mg via ORAL
  Filled 2014-12-22 (×3): qty 10

## 2014-12-22 MED ORDER — MIDODRINE HCL 5 MG PO TABS
5.0000 mg | ORAL_TABLET | Freq: Once | ORAL | Status: AC
  Administered 2014-12-22: 5 mg via ORAL
  Filled 2014-12-22 (×2): qty 1

## 2014-12-22 NOTE — Care Management Note (Signed)
Case Management Note  Patient Details  Name: Ariel Smith MRN: [id number] Date of Birth: 1946-01-13  Subjective/Objective:    Chronic oxygen at home. No new oxygen orders. Resume home health orders faxed to [facility].  [name] was receiving PT, RN, OT services per [facility].               Action/Plan:   Expected Discharge Date:                  Expected Discharge Plan:     In-House Referral:     Discharge planning Services     Post Acute Care Choice:    Choice offered to:     DME Arranged:    DME Agency:     HH Arranged:    HH Agency:     Status of Service:     Medicare Important Message Given:    Date Medicare IM Given:    Medicare IM give by:    Date Additional Medicare IM Given:    Additional Medicare Important Message give by:     If discussed at Long Length of Stay Meetings, dates discussed:    Additional Comments:  Griffin Gerrard A, RN 12/22/2014, 12:07 PM

## 2014-12-22 NOTE — Progress Notes (Signed)
POST HD   12/22/14 1500  Neurological  Level of Consciousness Alert  Orientation Level Oriented X4  Respiratory  Respiratory Pattern Dyspnea with exertion  Chest Assessment Chest expansion symmetrical  Bilateral Breath Sounds Clear;Diminished  Cough Non-productive  Cardiac  Pulse Regular  Heart Sounds Murmur  ECG Monitor Yes  Cardiac Rhythm NSR  Ectopy PAC  Ectopy Frequency Occasional  Vascular  Edema Left lower extremity;Right lower extremity  RLE Edema Non-pitting  LLE Edema +1  Integumentary  Integumentary (WDL) X  Skin Color Appropriate for ethnicity  Skin Condition Dry  Skin Integrity Ecchymosis  Ecchymosis Location Arm  Ecchymosis Location Orientation Right  Rash Location Thigh  Rash Location Orientation Bilateral  Musculoskeletal  Musculoskeletal (WDL) X  Generalized Weakness Yes  Assistive Device [facility]  Gastrointestinal  Bowel Sounds Assessment Active  GU Assessment  Genitourinary (WDL) WDL (HD)  Psychosocial  Psychosocial (WDL) WDL

## 2014-12-22 NOTE — Discharge Instructions (Signed)
Fluid ( 1200 ml/day) and salt restriction advised.

## 2014-12-22 NOTE — Progress Notes (Signed)
POST HD    12/22/14 1500  Report  Report Received From BRANDY MANSFIELD, RN  Vital Signs  Temp 98.3 F (36.8 C)  Temp Source Oral  Pulse Rate 96  Pulse Rate Source Monitor  Resp (!) 21  BP (!) 116/105 mmHg  BP Location Right Arm  BP Method Automatic  Patient Position (if appropriate) Lying  Dialysis Weight  Weight 97.4 kg (214 lb 11.7 oz)  Type of Weight Post-Dialysis  During Hemodialysis Assessment  Intra-Hemodialysis Comments HD TREATMENT END.  PATIENT  ALERT AND VS STABLE.  BLOOD RETURNED AND 15G NEEDLES REMOVED PER POLICY.    Post-Hemodialysis Assessment  Rinseback Volume (mL) 250 mL  Dialyzer Clearance Lightly streaked  Duration of HD Treatment -hour(s) 3 hour(s)  Hemodialysis Intake (mL) 500 mL  UF Total -Machine (mL) 2000 mL  Net UF (mL) 1500 mL  Tolerated HD Treatment Yes  Post-Hemodialysis Comments HD TREATMENT END.  PATIENT  ALERT AND VS STABLE.  BLOOD RETURNED AND 15G NEEDLES REMOVED PER POLICY.    AVG/AVF Arterial Site Held (minutes) 8 minutes  AVG/AVF Venous Site Held (minutes) 8 minutes  Education / Care Plan  Hemodialysis Education Provided Yes  Documented Education in Clinical Pathway Yes  Fistula / Graft Left Forearm Arteriovenous fistula  No Placement Date or Time found.   Placed prior to admission: Yes  Orientation: Left  Access Location: Forearm  Access Type: Arteriovenous fistula  Site Condition No complications  Fistula / Graft Assessment Present;Thrill;Bruit  Status Deaccessed  Needle Size 15  Drainage Description None

## 2014-12-22 NOTE — Progress Notes (Signed)
[facility]  ROUNDING NOTE   Subjective:   Hemodialysis yesterday. Tolerated treatment well. UF of 1.5 litres Now breathing more comfortably.   Objective:  Vital signs in last 24 hours:  Temp:  [97.8 F (36.6 C)-98.7 F (37.1 C)] 98 F (36.7 C) (09/03 0510) Pulse Rate:  [86-108] 98 (09/03 0510) Resp:  [14-35] 19 (09/03 0510) BP: (89-147)/(59-98) 131/71 mmHg (09/03 0510) SpO2:  [92 %-96 %] 92 % (09/03 0510) Weight:  [96.662 kg (213 lb 1.6 oz)-100.4 kg (221 lb 5.5 oz)] 97.07 kg (214 lb) (09/03 0500)  Weight change: 0.801 kg (1 lb 12.3 oz) Filed Weights   12/21/14 1800 12/21/14 1830 12/22/14 0500  Weight: 100 kg (220 lb 7.4 oz) 96.662 kg (213 lb 1.6 oz) 97.07 kg (214 lb)    Intake/Output: I/O last 3 completed shifts: In: 246 [P.O.:240; I.V.:6] Out: 1576 [Urine:75; Other:1500; Stool:1]   Intake/Output this shift:  Total I/O In: 240 [P.O.:240] Out: 0   Physical Exam: General: NAD,   Head: Normocephalic, atraumatic. Moist oral mucosal membranes  Eyes: Anicteric, PERRL  Neck: Supple, trachea midline  Lungs:  Clear to auscultation, 3 L [place]  Heart: Regular rate and rhythm  Abdomen:  Soft, nontender,   Extremities: no peripheral edema.  Neurologic: Nonfocal, moving all four extremities  Skin: No lesions  Access: AVF    Basic Metabolic Panel:  Recent Labs Lab 12/20/14 1733 12/21/14 0410 12/21/14 1534  NA 139 141 142  K 3.7 3.4* 3.1*  CL 97* 100* 102  CO2 GLUCOSE 102* 119* 147*  BUN 22* 27* 30*  CREATININE 3.63* 4.57* 5.29*  CALCIUM 8.5* 8.3* 8.3*  PHOS  --   --  2.9    Liver Function Tests:  Recent Labs Lab 12/20/14 1733 12/21/14 1534  AST 23  --   ALT 13*  --   ALKPHOS 79  --   BILITOT 0.4  --   PROT 6.4*  --   ALBUMIN 3.4* 3.2*   No results for input(s): LIPASE, AMYLASE in the last 168 hours. No results for input(s): AMMONIA in the last 168 hours.  CBC:  Recent Labs Lab 12/20/14 1733 12/21/14 0410 12/21/14 1534  12/22/14 0453  WBC 7.3 6.0 5.9  --   NEUTROABS 5.4  --   --   --   HGB 8.2* 7.5* 7.6* 7.9*  HCT 24.5* 22.4* 22.6*  --   MCV 102.1* 102.0* 102.4*  --   PLT 240 205 205  --     Cardiac Enzymes:  Recent Labs Lab 12/20/14 1733 12/20/14 2340  TROPONINI 0.04* 0.04*    BNP: Invalid input(s): POCBNP  CBG: No results for input(s): GLUCAP in the last 168 hours.  Microbiology: Results for orders placed or performed during the hospital encounter of 12/20/14  MRSA PCR Screening     Status: None   Collection Time: 12/21/14  2:13 AM  Result Value Ref Range Status   MRSA by PCR NEGATIVE NEGATIVE Final    Comment:        The GeneXpert MRSA Assay (FDA approved for NASAL specimens only), is one component of a comprehensive MRSA colonization surveillance program. It is not intended to diagnose MRSA infection nor to guide or monitor treatment for MRSA infections.     Coagulation Studies: No results for input(s): LABPROT, INR in the last 72 hours.  Urinalysis: No results for input(s): COLORURINE, LABSPEC, PHURINE, GLUCOSEU, HGBUR, BILIRUBINUR, KETONESUR, PROTEINUR, UROBILINOGEN, NITRITE, LEUKOCYTESUR in the last 72 hours.  Invalid input(s): APPERANCEUR    Imaging: Dg Chest 2 View  12/20/2014   CLINICAL DATA:  End-stage renal disease.  Difficulty breathing.  EXAM: CHEST  2 VIEW  COMPARISON:  PA and lateral chest 12/14/2014. Single view of the chest 11/12/2014 and CT chest 11/12/2014.  FINDINGS: There is cardiomegaly and vascular congestion. Small left pleural effusion is noted. Streaky left basilar airspace disease is identified.  IMPRESSION: Small left pleural effusion streaky left basilar airspace disease which could be atelectasis or pneumonia.  Cardiomegaly and vascular congestion.   Electronically Signed   By: Drusilla Kanner M.D.   On: 12/20/2014 18:06   Portable Chest 1 View  12/21/2014   CLINICAL DATA:  Respiratory failure, followup  EXAM: PORTABLE CHEST - 1 VIEW   COMPARISON:  Chest x-ray of 12/20/2014  FINDINGS: The lungs remain poorly aerated with indistinct perihilar vasculature suggesting mild pulmonary vascular congestion. No definite pleural effusion is seen. Cardiomegaly is stable.  IMPRESSION: Cardiomegaly and probable worsening of pulmonary vascular congestion.   Electronically Signed   By: Dwyane Dee M.D.   On: 12/21/2014 07:59     Medications:     . ALPRAZolam  1 mg Oral TID  . budesonide-formoterol  2 puff Inhalation BID  . calcitRIOL  0.25 mcg Oral Daily  . citalopram  20 mg Oral Daily  . donepezil  5 mg Oral QHS  . feeding supplement (NEPRO CARB STEADY)  237 mL Oral BID BM  . heparin  5,000 Units Subcutaneous 3 times per day  . pravastatin  20 mg Oral q1800  . sodium chloride  3 mL Intravenous Q12H  . traZODone  25 mg Oral QHS   sodium chloride, sodium chloride, acetaminophen **OR** acetaminophen, alteplase, guaifenesin, heparin, hydrOXYzine, ipratropium-albuterol, lidocaine (PF), lidocaine-prilocaine, ondansetron **OR** ondansetron (ZOFRAN) IV, pantoprazole, pentafluoroprop-tetrafluoroeth  Assessment/ Plan:  Ariel Smith is a 69 y.o. white female with ESRD, SHPTH, AOCD, HTN, hyperlipidemia, COPD, DVT with PE, history of tx with long term coumadin and IVC filter placement, L knee TKA, R shoulder surgery, IVC filter removed Aug 2014, pnemonia 8/16, admitted with shortness of breath 9/16.  TTS [facility] [address]   1. ESRD on HD TTHS: Pulmonary vascular congestion noted on chest x-ray. Extra hemodialysis yesterday. Tolerated treatment well.Ultrafiltration of 1.5 litres - Treatmetn today to keep on TTS schedule  2. Hyotension: well controlled. Midodrine before treatment.   3. Anemia of chronic kidney disease. Hemoglobin 7.9.  - Continue epo with HD.   4. Secondary hyperparathyroidism. Patient will continue on calcitriol cinacalcet. She also gets hectorol on outpatient HD.  - on calcium acetate with meals.    LOS:  2 Ariel Smith 9/3/201611:02 AM

## 2014-12-22 NOTE — Discharge Summary (Signed)
[facility] -  at [facility]   PATIENT NAME: Ariel Smith    MR#:  [id number]  DATE OF BIRTH:  1945-09-19  DATE OF ADMISSION:  12/20/2014 ADMITTING PHYSICIAN: Oralia Manis, MD  DATE OF DISCHARGE: 12/22/2014  PRIMARY CARE PHYSICIAN: [name], MD    ADMISSION DIAGNOSIS:  Dyspnea [R06.00] Hypoxia [R09.02] Elevated troponin [R79.89] Acute on chronic congestive heart failure, unspecified congestive heart failure type [I50.9]  DISCHARGE DIAGNOSIS:  Principal Problem:   Acute on chronic respiratory failure with hypoxia Active Problems:   Depression   HTN (hypertension)   HLD (hyperlipidemia)   ESRD (end stage renal disease) on dialysis   GERD (gastroesophageal reflux disease)   COPD (chronic obstructive pulmonary disease)   Acute on chronic respiratory failure with hypoxemia   SECONDARY DIAGNOSIS:   Past Medical History  Diagnosis Date  . Osteoarthritis   . Depression   . Frequent headaches   . HTN (hypertension)   . HLD (hyperlipidemia)   . History of colon polyps 2013    colonoscopy ([name])  . 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 Systolic murmur     mild  . GERD (gastroesophageal reflux disease)   . COPD (chronic obstructive pulmonary disease)   . ESRD (end stage renal disease) 08/2011    TThSa, L forearm AV fistula, [name]  . Secondary hyperparathyroidism of renal origin   . Osteopenia 01/2013  . Bronchiectasis 08/2014     suggested by thoracic xray  . Anemia   . Nonrheumatic aortic valve stenosis     unspecified  . DVT (deep venous thrombosis)     Chronic    HOSPITAL COURSE:   69 year old woman with history of multiple medical problems including COPD on home oxygen 3 L/m, end-stage renal disease on hemodialysis, recent hospital admission 5 weeks ago with GI bleed and pneumonia, on DO NOT RESUSCITATE status, brought in with  the complaints of acute respiratory distress with hypoxia, diagnosed to have acute and chronic respiratory failure with hypoxia and CHF  1. Acute on chronic respiratory failure with hypoxia secondary to combination of COPD and acute on chronic CHF. Much better now- back to 2 ltr/min - home dose of oxygen.  2. Recent Right lower lobe pneumonia, HCAP.  Afebrile and has normal white count. This is resolved.  3. COPD  No wheezing. Continue home inhalers and nebulizers when necessary. started on robitussin for cough.  4. End-stage renal disease on hemodialysis- Discussed with Dr. Cherylann Ratel. HD done Friday. Saturday being her regular HD day- will do dialysis here- and then d/c.  5. Chronic anemia, recent GI bleed. H&H low but stable. Monitor H&H closely, continue iron supplementation.  6. History of DVT status post IVC filter placement, not on Coumadin because of recent GI bleed.  7. Depression, stable on home medications. Continue same.   DISCHARGE CONDITIONS:   stable  CONSULTS OBTAINED:  Treatment Team:  Mady Haagensen, MD  DRUG ALLERGIES:   Allergies  Allergen Reactions  . Aspirin Other (See Comments)    Due to kidney disease  . Other Other (See Comments)    Pt states that she is allergic to Midrin and it causes headaches.     DISCHARGE MEDICATIONS:   Current Discharge Medication List    START taking these medications   Details  guaifenesin (ROBITUSSIN) 100 MG/5ML syrup Take 10 mLs (200 mg total) by  mouth every 6 (six) hours as needed for cough or congestion. Qty: 120 mL, Refills: 0      CONTINUE these medications which have NOT CHANGED   Details  acetaminophen (TYLENOL) 500 MG tablet Take 1,000 mg by mouth every 6 (six) hours as needed for mild pain or headache.     albuterol (VENTOLIN HFA) 108 (90 BASE) MCG/ACT inhaler Inhale 2 puffs into the lungs every 6 (six) hours as needed for wheezing or shortness of breath.     ALPRAZolam (XANAX) 1 MG tablet Take 1  tablet (1 mg total) by mouth 3 (three) times daily. Qty: 30 tablet, Refills: 0    baclofen (LIORESAL) 10 MG tablet Take 5-10 mg by mouth 2 (two) times daily as needed for muscle spasms.    benzonatate (TESSALON) 100 MG capsule Take 1 capsule (100 mg total) by mouth every 6 (six) hours as needed for cough. Qty: 20 capsule, Refills: 0    budesonide-formoterol (SYMBICORT) 160-4.5 MCG/ACT inhaler Inhale 2 puffs into the lungs 2 (two) times daily.    calcitRIOL (ROCALTROL) 0.25 MCG capsule Take 0.25 mcg by mouth daily.    calcium acetate (PHOSLO) 667 MG capsule Take 2,001 mg by mouth 3 (three) times daily with meals.     chlorpheniramine-HYDROcodone (TUSSIONEX) 10-8 MG/5ML SUER Take 5 mLs by mouth every 12 (twelve) hours as needed for cough. Qty: 140 mL, Refills: 0    cinacalcet (SENSIPAR) 60 MG tablet Take 60 mg by mouth daily.    citalopram (CELEXA) 20 MG tablet Take 1 tablet (20 mg total) by mouth daily. Qty: 90 tablet, Refills: 3    cyanocobalamin (,VITAMIN B-12,) 1000 MCG/ML injection Inject 1,000 mcg into the muscle every 30 (thirty) days. Start 10/2014 Qty: 1 mL, Refills: 0    docusate sodium (COLACE) 100 MG capsule Take 1 capsule (100 mg total) by mouth 2 (two) times daily. Qty: 10 capsule, Refills: 0    donepezil (ARICEPT) 5 MG tablet Take 1 tablet (5 mg total) by mouth at bedtime. Qty: 30 tablet, Refills: 3    hydrOXYzine (ATARAX/VISTARIL) 25 MG tablet Take 12.5-25 mg by mouth 2 (two) times daily as needed for anxiety.    lidocaine-prilocaine (EMLA) cream Apply 1 application topically as needed (topical anesthesia for hemodialysis if Gebauers and Lidocaine injection are ineffective.). Qty: 30 g, Refills: 0    lovastatin (MEVACOR) 20 MG tablet Take 20 mg by mouth at bedtime.    Multiple Vitamin (MULTIVITAMIN WITH MINERALS) TABS tablet Take 1 tablet by mouth daily. Qty: 1 tablet, Refills: 0    Nutritional Supplements (FEEDING SUPPLEMENT, NEPRO CARB STEADY,) LIQD Take 237  mLs by mouth 2 (two) times daily between meals. Qty: 1 Can, Refills: 0    pantoprazole (PROTONIX) 40 MG tablet Take 40 mg by mouth daily as needed (for heartburn/indigestion).    promethazine (PHENERGAN) 12.5 MG tablet Take 12.5 mg by mouth every 6 (six) hours as needed for nausea or vomiting.    traZODone (DESYREL) 50 MG tablet Take 0.5-1 tablets (25-50 mg total) by mouth at bedtime. Qty: 30 tablet, Refills: 3    zolpidem (AMBIEN) 5 MG tablet Take 1 tablet (5 mg total) by mouth at bedtime as needed for sleep. Qty: 30 tablet, Refills: 0      STOP taking these medications     amoxicillin-clavulanate (AUGMENTIN) 875-125 MG per tablet          DISCHARGE INSTRUCTIONS:    Follow with PMD.  If you experience worsening of your admission symptoms,  develop shortness of breath, life threatening emergency, suicidal or homicidal thoughts you must seek medical attention immediately by calling 911 or calling your MD immediately  if symptoms less severe.  You Must read complete instructions/literature along with all the possible adverse reactions/side effects for all the Medicines you take and that have been prescribed to you. Take any new Medicines after you have completely understood and accept all the possible adverse reactions/side effects.   Please note  You were cared for by a hospitalist during your hospital stay. If you have any questions about your discharge medications or the care you received while you were in the hospital after you are discharged, you can call the unit and asked to speak with the hospitalist on call if the hospitalist that took care of you is not available. Once you are discharged, your primary care physician will handle any further medical issues. Please note that NO REFILLS for any discharge medications will be authorized once you are discharged, as it is imperative that you return to your primary care physician (or establish a relationship with a primary care  physician if you do not have one) for your aftercare needs so that they can reassess your need for medications and monitor your lab values.    Today   CHIEF COMPLAINT:   Chief Complaint  Patient presents with  . Shortness of Breath    HISTORY OF PRESENT ILLNESS:  Ariel Smith  is a 69 y.o. female presents with persistent shortness of breath. Patient states that over the past couple of months she's had 2 bouts of pneumonia, this is superimposed on baseline COPD and end-stage renal disease with tenuous fluid balance situation. She states that she has aortic stenosis which is a limiting factor in regulating her fluid balance of dialysis. She comes in today with worsening shortness of breath was evaluated by her home health nurse which led to the recommendation to come to the ED by her primary care physician. She is on home oxygen, and her sats have been dropping at home with even minimal exertion. She also had increased work of breathing at rest. On evaluation in the ED she was noted to have pulmonary edema on chest x-ray. She did just have a dialysis session the day prior to presentation in the ED, but she does not know how much fluid was pulled. In the ED she has required increase in her supplemental oxygen amount in order to maintain oxygen sats had an appropriate level. Hospitals were called for admission for acute on chronic respiratory failure.   VITAL SIGNS:  Blood pressure 131/71, pulse 98, temperature 98 F (36.7 C), temperature source Oral, resp. rate 19, height 5\' 4"  (1.626 m), weight 97.07 kg (214 lb), SpO2 92 %.  I/O:   Intake/Output Summary (Last 24 hours) at 12/22/14 1114 Last data filed at 12/22/14 1048  Gross per 24 hour  Intake    243 ml  Output   1500 ml  Net  -1257 ml    PHYSICAL EXAMINATION:   Constitutional: She is oriented to person, place, and time and well-developed, well-nourished, and in no distress.  HENT:  Head: Normocephalic and atraumatic.  Eyes:  Conjunctivae and EOM are normal. Pupils are equal, round, and reactive to light.  Neck: Normal range of motion. Neck supple. No tracheal deviation present. No thyromegaly present.  Cardiovascular: Normal rate, regular rhythm and normal heart sounds.  Pulmonary/Chest: No respiratory distress. She has no wheezes. She has no rales. She exhibits no tenderness.  Abdominal: Soft. Bowel sounds are normal. She exhibits no distension. There is no tenderness.  Musculoskeletal: Normal range of motion.  Neurological: She is alert and oriented to person, place, and time. No cranial nerve deficit.  Skin: Skin is warm and dry. No rash noted.  Psychiatric: Mood and affect normal.   DATA REVIEW:   CBC  Recent Labs Lab 12/21/14 1534 12/22/14 0453  WBC 5.9  --   HGB 7.6* 7.9*  HCT 22.6*  --   PLT 205  --     Chemistries   Recent Labs Lab 12/20/14 1733  12/21/14 1534  NA 139  < > 142  K 3.7  < > 3.1*  CL 97*  < > 102  CO2 28  < > 29  GLUCOSE 102*  < > 147*  BUN 22*  < > 30*  CREATININE 3.63*  < > 5.29*  CALCIUM 8.5*  < > 8.3*  AST 23  --   --   ALT 13*  --   --   ALKPHOS 79  --   --   BILITOT 0.4  --   --   < > = values in this interval not displayed.  Cardiac Enzymes  Recent Labs Lab 12/20/14 2340  TROPONINI 0.04*    Microbiology Results  Results for orders placed or performed during the hospital encounter of 12/20/14  MRSA PCR Screening     Status: None   Collection Time: 12/21/14  2:13 AM  Result Value Ref Range Status   MRSA by PCR NEGATIVE NEGATIVE Final    Comment:        The GeneXpert MRSA Assay (FDA approved for NASAL specimens only), is one component of a comprehensive MRSA colonization surveillance program. It is not intended to diagnose MRSA infection nor to guide or monitor treatment for MRSA infections.     RADIOLOGY:  Dg Chest 2 View  12/20/2014   CLINICAL DATA:  End-stage renal disease.  Difficulty breathing.  EXAM: CHEST  2 VIEW  COMPARISON:  PA  and lateral chest 12/14/2014. Single view of the chest 11/12/2014 and CT chest 11/12/2014.  FINDINGS: There is cardiomegaly and vascular congestion. Small left pleural effusion is noted. Streaky left basilar airspace disease is identified.  IMPRESSION: Small left pleural effusion streaky left basilar airspace disease which could be atelectasis or pneumonia.  Cardiomegaly and vascular congestion.   Electronically Signed   By: Drusilla Kanner M.D.   On: 12/20/2014 18:06   Portable Chest 1 View  12/21/2014   CLINICAL DATA:  Respiratory failure, followup  EXAM: PORTABLE CHEST - 1 VIEW  COMPARISON:  Chest x-ray of 12/20/2014  FINDINGS: The lungs remain poorly aerated with indistinct perihilar vasculature suggesting mild pulmonary vascular congestion. No definite pleural effusion is seen. Cardiomegaly is stable.  IMPRESSION: Cardiomegaly and probable worsening of pulmonary vascular congestion.   Electronically Signed   By: Dwyane Dee M.D.   On: 12/21/2014 07:59    Management plans discussed with the patient, family and they are in agreement.  CODE STATUS:     Code Status Orders        Start     Ordered   12/21/14 0135  Full code   Continuous     12/21/14 0135    Advance Directive Documentation        Most Recent Value   Type of Advance Directive  Healthcare Power of Attorney   Pre-existing out of facility DNR order (yellow form or pink MOST form)     "  MOST" Form in Place?        TOTAL TIME TAKING CARE OF THIS PATIENT:40  minutes.    Altamese Dilling M.D on 12/22/2014 at 11:14 AM  Between 7am to 6pm - Pager - [phone]  After 6pm go to [email or web address] - password [email or web address]  [place]  Office  [phone]  CC: Primary care physician; [name], MD

## 2014-12-22 NOTE — Progress Notes (Signed)
[facility] - [place] at [facility]   PATIENT NAME: Ariel Smith    MR#:  [id number]  DATE OF BIRTH:  12/21/1945  SUBJECTIVE:  CHIEF COMPLAINT:   Chief Complaint  Patient presents with  . Shortness of Breath   Less SOB- back to 2 ltr/min as her home use. Some dry cough.  Today is her regular dialysis day.  REVIEW OF SYSTEMS:  Review of Systems  Constitutional: Negative for fever, weight loss, malaise/fatigue and diaphoresis.  HENT: Negative for ear discharge, ear pain, hearing loss, nosebleeds, sore throat and tinnitus.   Eyes: Negative for blurred vision and pain.  Respiratory: Positive for cough. Negative for hemoptysis, shortness of breath and wheezing.   Cardiovascular: Negative for chest pain, palpitations, orthopnea and leg swelling.  Gastrointestinal: Negative for heartburn, nausea, vomiting, abdominal pain, diarrhea, constipation and blood in stool.  Genitourinary: Negative for dysuria, urgency and frequency.  Musculoskeletal: Negative for myalgias and back pain.  Skin: Negative for itching and rash.  Neurological: Negative for dizziness, tingling, tremors, focal weakness, seizures, weakness and headaches.  Psychiatric/Behavioral: Negative for depression. The patient is not nervous/anxious.    DRUG ALLERGIES:   Allergies  Allergen Reactions  . Aspirin Other (See Comments)    Due to kidney disease  . Other Other (See Comments)    Pt states that she is allergic to Midrin and it causes headaches.    VITALS:  Blood pressure 131/71, pulse 98, temperature 98 F (36.7 C), temperature source Oral, resp. rate 19, height  (1.626 m), weight 97.07 kg (214 lb), SpO2 92 %. PHYSICAL EXAMINATION:  Physical Exam  Constitutional: She is oriented to person, place, and time and well-developed, well-nourished, and in no distress.  HENT:  Head: Normocephalic and atraumatic.  Eyes: Conjunctivae and EOM are normal. Pupils are equal, round, and reactive to  light.  Neck: Normal range of motion. Neck supple. No tracheal deviation present. No thyromegaly present.  Cardiovascular: Normal rate, regular rhythm and normal heart sounds.   Pulmonary/Chest: No respiratory distress. She has no wheezes. She has no rales. She exhibits no tenderness.  Abdominal: Soft. Bowel sounds are normal. She exhibits no distension. There is no tenderness.  Musculoskeletal: Normal range of motion.  Neurological: She is alert and oriented to person, place, and time. No cranial nerve deficit.  Skin: Skin is warm and dry. No rash noted.  Psychiatric: Mood and affect normal.   LABORATORY PANEL:   CBC  Recent Labs Lab 12/21/14 1534 12/22/14 0453  WBC 5.9  --   HGB 7.6* 7.9*  HCT 22.6*  --   PLT 205  --    ------------------------------------------------------------------------------------------------------------------ Chemistries   Recent Labs Lab 12/20/14 1733  12/21/14 1534  NA 139  < > 142  K 3.7  < > 3.1*  CL 97*  < > 102  CO2 28  < > 29  GLUCOSE 102*  < > 147*  BUN 22*  < > 30*  CREATININE 3.63*  < > 5.29*  CALCIUM 8.5*  < > 8.3*  AST 23  --   --   ALT 13*  --   --   ALKPHOS 79  --   --   BILITOT 0.4  --   --   < > = values in this interval not displayed. RADIOLOGY:  No results found. ASSESSMENT AND PLAN:  69 year old woman with history of multiple medical problems including COPD on home oxygen 3 L/m, end-stage renal disease on hemodialysis, recent hospital admission  5 weeks ago with GI bleed and pneumonia, on DO NOT RESUSCITATE status, brought in with the complaints of acute respiratory distress with hypoxia, diagnosed to have acute and chronic respiratory failure with hypoxia and CHF  1. Acute on chronic respiratory failure with hypoxia secondary to combination of COPD and acute on chronic CHF. Much better now- back to 2 ltr/min - home dose of oxygen.  2. Recent Right lower lobe pneumonia, HCAP.  Afebrile and has normal white count. This is  resolved.  3. COPD  No wheezing. Continue home inhalers and nebulizers when necessary.  started on robitussin for cough.  4. End-stage renal disease on hemodialysis- Discussed with Dr. Cherylann Ratel. HD done Friday. Saturday being her regular HD day- will do dialysis here- and then d/c.  5. Chronic anemia, recent GI bleed. H&H low but stable. Monitor H&H closely, continue iron supplementation.  6. History of DVT status post IVC filter placement, not on Coumadin because of recent GI bleed.  7. Depression, stable on home medications. Continue same.  D/c today after HD.  All the records are reviewed and case discussed with Care Management/Social Worker. Management plans discussed with the patient, family and they are in agreement.  CODE STATUS: Full code  TOTAL TIME TAKING CARE OF THIS PATIENT: 35 minutes.   More than 50% of the time was spent in counseling/coordination of care: YES  POSSIBLE D/C IN 1-2 DAYS, DEPENDING ON CLINICAL CONDITION.   Altamese Dilling M.D on 12/22/2014 at 10:56 AM  Between 7am to 6pm - Pager - [phone]  After 6pm go to [email or web address] - password [email or web address] [facility]  [place] [facility]  Office  [phone]  CC:  Primary care physician; [name], MD

## 2014-12-22 NOTE — Care Management Note (Signed)
Case Management Note  Patient Details  Name: Ariel Smith MRN: [id number] Date of Birth: 1946/01/19  Subjective/Objective:       Resume home health PT, RN, OT with [facility]. V.O. Dr Audree Bane, RN, BSN, CM on 12/22/14 at 11:40am.              Action/Plan:   Expected Discharge Date:                  Expected Discharge Plan:     In-House Referral:     Discharge planning Services     Post Acute Care Choice:    Choice offered to:     DME Arranged:    DME Agency:     HH Arranged:    HH Agency:     Status of Service:     Medicare Important Message Given:    Date Medicare IM Given:    Medicare IM give by:    Date Additional Medicare IM Given:    Additional Medicare Important Message give by:     If discussed at Long Length of Stay Meetings, dates discussed:    Additional Comments:  Travis Purk A, RN 12/22/2014, 11:39 AM

## 2014-12-22 NOTE — Progress Notes (Signed)
PRE HD   12/22/14 1145  Neurological  Level of Consciousness Alert  Orientation Level Oriented X4  Respiratory  Respiratory Pattern Dyspnea with exertion  Chest Assessment Chest expansion symmetrical  Bilateral Breath Sounds Clear;Diminished  Cardiac  Pulse Regular  Heart Sounds Murmur  ECG Monitor Yes  Cardiac Rhythm NSR  Ectopy PAC  Ectopy Frequency Occasional  Vascular  Edema Left lower extremity;Right lower extremity  RLE Edema +1  LLE Edema +1  Integumentary  Integumentary (WDL) X  Skin Color Appropriate for ethnicity  Skin Condition Dry  Skin Integrity Ecchymosis  Ecchymosis Location Arm  Ecchymosis Location Orientation Right  Rash Location Thigh  Rash Location Orientation Bilateral  Musculoskeletal  Musculoskeletal (WDL) X  Generalized Weakness Yes  Assistive Device [facility]  Gastrointestinal  Bowel Sounds Assessment Active  GU Assessment  Genitourinary (WDL) WDL (HD)  Psychosocial  Psychosocial (WDL) WDL

## 2014-12-22 NOTE — Progress Notes (Signed)
HD END 

## 2014-12-22 NOTE — Progress Notes (Signed)
Patient d/c'd home. Education provided, no questions at this time. Patient to be picked up by husband. Telemetry removed. Jayleene Glaeser R [place]  

## 2014-12-22 NOTE — Progress Notes (Signed)
PRE HD   12/22/14 1145  Vital Signs  Temp 97.5 F (36.4 C)  Temp Source Oral  Pulse Rate Source Monitor  BP Location Right Arm  BP Method Automatic  Patient Position (if appropriate) Lying  Oxygen Therapy  SpO2 98 %  O2 Device Nasal Cannula  O2 Flow Rate (L/min) 2 L/min  Pain Assessment  Pain Assessment No/denies pain  Pain Score 0  Dialysis Weight  Weight 99 kg (218 lb 4.1 oz)  Type of Weight Pre-Dialysis  Time-Out for Hemodialysis  What Procedure? HEMODIALYSIS  Pt Identifiers(min of two) First/Last Name;MRN/Account#  Correct Site? Yes  Correct Side? Yes  Correct Procedure? Yes  Consents Verified? Yes  Rad Studies Available? N/A  Safety Precautions Reviewed? Yes  Biochemist, clinical Number [phone]  Station Number 1  UF/Alarm Test Passed  Conductivity: Meter 13.9  Conductivity: Machine  14.2  pH 7.4  Reverse Osmosis MAIN  Dialyzer Lot Number [id number]  Disposable Set Lot Number [id number]  Machine Temperature 98.6 F (37 C)  Immunologist and Audible Yes  Blood Lines Intact and Secured Yes  Pre Treatment Patient Checks  Vascular access used during treatment Fistula  Hepatitis B Surface Antigen Results Negative  Date Hepatitis B Surface Antigen Drawn 12/12/14  Hepatitis B Surface Antibody 10.8  Date Hepatitis B Surface Antibody Drawn 11/11/14  Hemodialysis Consent Verified Yes  Hemodialysis Standing Orders Initiated Yes  ECG (Telemetry) Monitor On Yes  Prime Ordered Normal Saline  Length of  DialysisTreatment -hour(s) 3 Hour(s)  Dialysis Treatment Comments GOAL TO REMOVE 1.5 L OVER 3 HOURS.  PT ALERT AND VS STABLE.  15G NEEDLES INSERTED WITHOUT DIFFICULTY.    Dialyzer Optiflux 180 NR  Dialysate 2K, 2.5 Ca  Dialysis Anticoagulant None  Dialysate Flow Ordered 600  Blood Flow Rate Ordered 400 mL/min  Ultrafiltration Goal 1.5 Liters  Pre Treatment Labs Renal panel;Differential;CBC  Dialysis Blood Pressure Support Ordered Normal Saline  Education / Care  Plan  Hemodialysis Education Provided Yes  Documented Education in Clinical Pathway Yes  Fistula / Graft Left Forearm Arteriovenous fistula  No Placement Date or Time found.   Placed prior to admission: Yes  Orientation: Left  Access Location: Forearm  Access Type: Arteriovenous fistula  Site Condition No complications  Fistula / Graft Assessment Present;Thrill;Bruit  Status Accessed  Needle Size 15  Drainage Description None

## 2014-12-22 NOTE — Progress Notes (Signed)
HD START 

## 2014-12-25 ENCOUNTER — Telehealth: Payer: Self-pay | Admitting: *Deleted

## 2014-12-25 NOTE — Telephone Encounter (Signed)
Spoke with patient and she said she's doing okay. She just gets very short winded whenever she moves around. She has to move around due to fluid build up. She said she doesn't have any needs at the moment and is doing okay at home. Appt scheduled.

## 2014-12-28 ENCOUNTER — Encounter: Payer: Self-pay | Admitting: Emergency Medicine

## 2014-12-28 ENCOUNTER — Inpatient Hospital Stay
Admission: EM | Admit: 2014-12-28 | Discharge: 2014-12-31 | DRG: 291 | Disposition: A | Discharge status: Home Health | Payer: Medicare Other | Attending: Specialist | Admitting: Specialist

## 2014-12-28 ENCOUNTER — Telehealth: Payer: Self-pay | Admitting: Family Medicine

## 2014-12-28 ENCOUNTER — Telehealth: Payer: Self-pay | Admitting: Cardiovascular Disease

## 2014-12-28 ENCOUNTER — Emergency Department: Payer: Medicare Other

## 2014-12-28 ENCOUNTER — Ambulatory Visit: Payer: Medicare Other | Admitting: Family Medicine

## 2014-12-28 DIAGNOSIS — M8580 Other specified disorders of bone density and structure, unspecified site: Secondary | ICD-10-CM | POA: Diagnosis present

## 2014-12-28 DIAGNOSIS — J449 Chronic obstructive pulmonary disease, unspecified: Secondary | ICD-10-CM | POA: Diagnosis present

## 2014-12-28 DIAGNOSIS — I120 Hypertensive chronic kidney disease with stage 5 chronic kidney disease or end stage renal disease: Secondary | ICD-10-CM | POA: Diagnosis present

## 2014-12-28 DIAGNOSIS — N186 End stage renal disease: Secondary | ICD-10-CM | POA: Diagnosis present

## 2014-12-28 DIAGNOSIS — Z823 Family history of stroke: Secondary | ICD-10-CM

## 2014-12-28 DIAGNOSIS — Z992 Dependence on renal dialysis: Secondary | ICD-10-CM

## 2014-12-28 DIAGNOSIS — J810 Acute pulmonary edema: Secondary | ICD-10-CM | POA: Diagnosis not present

## 2014-12-28 DIAGNOSIS — I350 Nonrheumatic aortic (valve) stenosis: Secondary | ICD-10-CM | POA: Diagnosis present

## 2014-12-28 DIAGNOSIS — R0902 Hypoxemia: Secondary | ICD-10-CM

## 2014-12-28 DIAGNOSIS — F329 Major depressive disorder, single episode, unspecified: Secondary | ICD-10-CM | POA: Diagnosis present

## 2014-12-28 DIAGNOSIS — Z86718 Personal history of other venous thrombosis and embolism: Secondary | ICD-10-CM

## 2014-12-28 DIAGNOSIS — Z86711 Personal history of pulmonary embolism: Secondary | ICD-10-CM

## 2014-12-28 DIAGNOSIS — Z9981 Dependence on supplemental oxygen: Secondary | ICD-10-CM

## 2014-12-28 DIAGNOSIS — I5033 Acute on chronic diastolic (congestive) heart failure: Secondary | ICD-10-CM | POA: Diagnosis present

## 2014-12-28 DIAGNOSIS — Z8249 Family history of ischemic heart disease and other diseases of the circulatory system: Secondary | ICD-10-CM

## 2014-12-28 DIAGNOSIS — K219 Gastro-esophageal reflux disease without esophagitis: Secondary | ICD-10-CM | POA: Diagnosis present

## 2014-12-28 DIAGNOSIS — N2581 Secondary hyperparathyroidism of renal origin: Secondary | ICD-10-CM | POA: Diagnosis present

## 2014-12-28 DIAGNOSIS — E785 Hyperlipidemia, unspecified: Secondary | ICD-10-CM | POA: Diagnosis present

## 2014-12-28 DIAGNOSIS — Z87891 Personal history of nicotine dependence: Secondary | ICD-10-CM

## 2014-12-28 DIAGNOSIS — M1990 Unspecified osteoarthritis, unspecified site: Secondary | ICD-10-CM | POA: Diagnosis present

## 2014-12-28 DIAGNOSIS — D631 Anemia in chronic kidney disease: Secondary | ICD-10-CM | POA: Diagnosis present

## 2014-12-28 DIAGNOSIS — J9601 Acute respiratory failure with hypoxia: Secondary | ICD-10-CM | POA: Diagnosis present

## 2014-12-28 DIAGNOSIS — Z86010 Personal history of colonic polyps: Secondary | ICD-10-CM | POA: Diagnosis not present

## 2014-12-28 LAB — BLOOD GAS, VENOUS
Acid-Base Excess: 13.7 mmol/L — ABNORMAL HIGH (ref 0.0–3.0)
BICARBONATE: 37.6 meq/L — AB (ref 21.0–28.0)
DELIVERY SYSTEMS: POSITIVE
FIO2: 0.3
PCO2 VEN: 43 mmHg — AB (ref 44.0–60.0)
Patient temperature: 37
pH, Ven: 7.55 — ABNORMAL HIGH (ref 7.320–7.430)

## 2014-12-28 LAB — TROPONIN I: Troponin I: 0.07 ng/mL — ABNORMAL HIGH (ref <0.031)

## 2014-12-28 LAB — BASIC METABOLIC PANEL
Anion gap: 10 (ref 5–15)
BUN: 25 mg/dL — AB (ref 6–20)
CHLORIDE: 99 mmol/L — AB (ref 101–111)
CO2: 33 mmol/L — AB (ref 22–32)
CREATININE: 4.95 mg/dL — AB (ref 0.44–1.00)
Calcium: 9.2 mg/dL (ref 8.9–10.3)
GFR calc Af Amer: 9 mL/min — ABNORMAL LOW (ref >60)
GFR calc non Af Amer: 8 mL/min — ABNORMAL LOW (ref >60)
GLUCOSE: 107 mg/dL — AB (ref 65–99)
POTASSIUM: 3.9 mmol/L (ref 3.5–5.1)
Sodium: 142 mmol/L (ref 135–145)

## 2014-12-28 LAB — CBC
HEMATOCRIT: 23.5 % — AB (ref 35.0–47.0)
HEMOGLOBIN: 7.6 g/dL — AB (ref 12.0–16.0)
MCH: 33.4 pg (ref 26.0–34.0)
MCHC: 32.5 g/dL (ref 32.0–36.0)
MCV: 102.6 fL — AB (ref 80.0–100.0)
Platelets: 196 10*3/uL (ref 150–440)
RBC: 2.29 MIL/uL — AB (ref 3.80–5.20)
RDW: 17.2 % — ABNORMAL HIGH (ref 11.5–14.5)
WBC: 6.6 10*3/uL (ref 3.6–11.0)

## 2014-12-28 LAB — BRAIN NATRIURETIC PEPTIDE: B NATRIURETIC PEPTIDE 5: 2712 pg/mL — AB (ref 0.0–100.0)

## 2014-12-28 LAB — PROTIME-INR
INR: 1.07
PROTHROMBIN TIME: 14.1 s (ref 11.4–15.0)

## 2014-12-28 MED ORDER — BUDESONIDE-FORMOTEROL FUMARATE 160-4.5 MCG/ACT IN AERO
2.0000 | INHALATION_SPRAY | Freq: Two times a day (BID) | RESPIRATORY_TRACT | Status: DC
  Administered 2014-12-28 – 2014-12-31 (×5): 2 via RESPIRATORY_TRACT
  Filled 2014-12-28: qty 6

## 2014-12-28 MED ORDER — PROMETHAZINE HCL 25 MG PO TABS
12.5000 mg | ORAL_TABLET | Freq: Four times a day (QID) | ORAL | Status: DC | PRN
  Administered 2014-12-29: 12.5 mg via ORAL
  Filled 2014-12-28 (×2): qty 1

## 2014-12-28 MED ORDER — ALBUTEROL SULFATE HFA 108 (90 BASE) MCG/ACT IN AERS: 2.0000 | INHALATION_SPRAY | RESPIRATORY_TRACT | Status: DC | PRN

## 2014-12-28 MED ORDER — ACETAMINOPHEN 650 MG RE SUPP: 650.0000 mg | Freq: Four times a day (QID) | RECTAL | Status: DC | PRN

## 2014-12-28 MED ORDER — ALBUMIN HUMAN 25 % IV SOLN
25.0000 g | Freq: Once | INTRAVENOUS | Status: AC
  Administered 2014-12-28: 25 g via INTRAVENOUS
  Filled 2014-12-28: qty 100

## 2014-12-28 MED ORDER — ALBUTEROL SULFATE (2.5 MG/3ML) 0.083% IN NEBU: 2.5000 mg | INHALATION_SOLUTION | RESPIRATORY_TRACT | Status: DC | PRN

## 2014-12-28 MED ORDER — IPRATROPIUM-ALBUTEROL 0.5-2.5 (3) MG/3ML IN SOLN
3.0000 mL | Freq: Once | RESPIRATORY_TRACT | Status: AC
  Administered 2014-12-28: 3 mL via RESPIRATORY_TRACT
  Filled 2014-12-28: qty 3

## 2014-12-28 MED ORDER — BACLOFEN 5 MG HALF TABLET
5.0000 mg | ORAL_TABLET | Freq: Two times a day (BID) | ORAL | Status: DC | PRN
  Filled 2014-12-28: qty 2

## 2014-12-28 MED ORDER — CALCIUM ACETATE (PHOS BINDER) 667 MG PO CAPS
2001.0000 mg | ORAL_CAPSULE | Freq: Three times a day (TID) | ORAL | Status: DC
  Administered 2014-12-29 – 2014-12-31 (×7): 2001 mg via ORAL
  Filled 2014-12-28 (×7): qty 3

## 2014-12-28 MED ORDER — TRAZODONE HCL 50 MG PO TABS
25.0000 mg | ORAL_TABLET | Freq: Every day | ORAL | Status: DC
  Administered 2014-12-28 – 2014-12-30 (×3): 50 mg via ORAL
  Filled 2014-12-28 (×3): qty 1

## 2014-12-28 MED ORDER — ZOLPIDEM TARTRATE 5 MG PO TABS
5.0000 mg | ORAL_TABLET | Freq: Every evening | ORAL | Status: DC | PRN
  Administered 2014-12-28: 5 mg via ORAL
  Filled 2014-12-28: qty 1

## 2014-12-28 MED ORDER — ONDANSETRON HCL 4 MG PO TABS: 4.0000 mg | ORAL_TABLET | Freq: Four times a day (QID) | ORAL | Status: DC | PRN

## 2014-12-28 MED ORDER — ADULT MULTIVITAMIN W/MINERALS CH
1.0000 | ORAL_TABLET | Freq: Every day | ORAL | Status: DC
  Administered 2014-12-29 – 2014-12-31 (×3): 1 via ORAL
  Filled 2014-12-28 (×3): qty 1

## 2014-12-28 MED ORDER — DONEPEZIL HCL 5 MG PO TABS
5.0000 mg | ORAL_TABLET | Freq: Every day | ORAL | Status: DC
  Administered 2014-12-28 – 2014-12-30 (×3): 5 mg via ORAL
  Filled 2014-12-28 (×3): qty 1

## 2014-12-28 MED ORDER — PANTOPRAZOLE SODIUM 40 MG PO TBEC: 40.0000 mg | DELAYED_RELEASE_TABLET | Freq: Every day | ORAL | Status: DC | PRN

## 2014-12-28 MED ORDER — BENZONATATE 100 MG PO CAPS
100.0000 mg | ORAL_CAPSULE | Freq: Four times a day (QID) | ORAL | Status: DC | PRN
  Administered 2014-12-28 – 2014-12-29 (×3): 100 mg via ORAL
  Filled 2014-12-28 (×3): qty 1

## 2014-12-28 MED ORDER — HEPARIN SODIUM (PORCINE) 5000 UNIT/ML IJ SOLN
5000.0000 [IU] | Freq: Three times a day (TID) | INTRAMUSCULAR | Status: DC
  Administered 2014-12-28 – 2014-12-31 (×8): 5000 [IU] via SUBCUTANEOUS
  Filled 2014-12-28 (×8): qty 1

## 2014-12-28 MED ORDER — ALPRAZOLAM 1 MG PO TABS: 1.0000 mg | ORAL_TABLET | Freq: Three times a day (TID) | ORAL | Status: DC

## 2014-12-28 MED ORDER — ACETAMINOPHEN 325 MG PO TABS: 650.0000 mg | ORAL_TABLET | Freq: Four times a day (QID) | ORAL | Status: DC | PRN

## 2014-12-28 MED ORDER — CALCITRIOL 0.25 MCG PO CAPS: 0.2500 ug | ORAL_CAPSULE | Freq: Every day | ORAL | Status: DC

## 2014-12-28 MED ORDER — METHYLPREDNISOLONE SODIUM SUCC 125 MG IJ SOLR
125.0000 mg | INTRAMUSCULAR | Status: AC
  Administered 2014-12-28: 125 mg via INTRAVENOUS
  Filled 2014-12-28: qty 2

## 2014-12-28 MED ORDER — PRAVASTATIN SODIUM 20 MG PO TABS
20.0000 mg | ORAL_TABLET | Freq: Every day | ORAL | Status: DC
  Administered 2014-12-29 – 2014-12-30 (×2): 20 mg via ORAL
  Filled 2014-12-28 (×2): qty 1

## 2014-12-28 MED ORDER — CHLORHEXIDINE GLUCONATE 0.12% ORAL RINSE (MEDLINE KIT)
15.0000 mL | Freq: Two times a day (BID) | OROMUCOSAL | Status: DC
  Administered 2014-12-30 – 2014-12-31 (×2): 15 mL via OROMUCOSAL
  Filled 2014-12-28 (×8): qty 15

## 2014-12-28 MED ORDER — LIDOCAINE-PRILOCAINE 2.5-2.5 % EX CREA: 1.0000 "application " | TOPICAL_CREAM | CUTANEOUS | Status: DC | PRN

## 2014-12-28 MED ORDER — ACETAMINOPHEN 500 MG PO TABS: 1000.0000 mg | ORAL_TABLET | Freq: Four times a day (QID) | ORAL | Status: DC | PRN

## 2014-12-28 MED ORDER — INFLUENZA VAC SPLIT QUAD 0.5 ML IM SUSY
0.5000 mL | PREFILLED_SYRINGE | INTRAMUSCULAR | Status: AC
  Administered 2014-12-29: 0.5 mL via INTRAMUSCULAR
  Filled 2014-12-28: qty 0.5

## 2014-12-28 MED ORDER — ONDANSETRON HCL 4 MG/2ML IJ SOLN: 4.0000 mg | Freq: Four times a day (QID) | INTRAMUSCULAR | Status: DC | PRN

## 2014-12-28 MED ORDER — ALPRAZOLAM 1 MG PO TABS
1.0000 mg | ORAL_TABLET | Freq: Three times a day (TID) | ORAL | Status: DC | PRN
  Administered 2014-12-28 – 2014-12-30 (×2): 1 mg via ORAL
  Filled 2014-12-28 (×2): qty 1

## 2014-12-28 MED ORDER — HYDROCOD POLST-CPM POLST ER 10-8 MG/5ML PO SUER
5.0000 mL | Freq: Two times a day (BID) | ORAL | Status: DC | PRN
  Administered 2014-12-29 – 2014-12-30 (×2): 5 mL via ORAL
  Filled 2014-12-28 (×2): qty 5

## 2014-12-28 MED ORDER — CYANOCOBALAMIN 1000 MCG/ML IJ SOLN
1000.0000 ug | INTRAMUSCULAR | Status: DC
  Administered 2014-12-29: 1000 ug via INTRAMUSCULAR
  Filled 2014-12-28 (×2): qty 1

## 2014-12-28 MED ORDER — CINACALCET HCL 30 MG PO TABS
60.0000 mg | ORAL_TABLET | Freq: Every day | ORAL | Status: DC
  Administered 2014-12-29 – 2014-12-31 (×3): 60 mg via ORAL
  Filled 2014-12-28 (×3): qty 2

## 2014-12-28 MED ORDER — CITALOPRAM HYDROBROMIDE 20 MG PO TABS
20.0000 mg | ORAL_TABLET | Freq: Every day | ORAL | Status: DC
  Administered 2014-12-29 – 2014-12-31 (×3): 20 mg via ORAL
  Filled 2014-12-28 (×3): qty 1

## 2014-12-28 MED ORDER — GUAIFENESIN 100 MG/5ML PO SYRP
200.0000 mg | ORAL_SOLUTION | Freq: Four times a day (QID) | ORAL | Status: DC | PRN
  Filled 2014-12-28: qty 10

## 2014-12-28 MED ORDER — DOCUSATE SODIUM 100 MG PO CAPS
100.0000 mg | ORAL_CAPSULE | Freq: Two times a day (BID) | ORAL | Status: DC
  Administered 2014-12-28 – 2014-12-31 (×5): 100 mg via ORAL
  Filled 2014-12-28 (×5): qty 1

## 2014-12-28 MED ORDER — HYDROXYZINE HCL 25 MG PO TABS
12.5000 mg | ORAL_TABLET | Freq: Two times a day (BID) | ORAL | Status: DC | PRN
Start: 2014-12-28 — End: 2014-12-31

## 2014-12-28 MED ORDER — ANTISEPTIC ORAL RINSE SOLUTION (CORINZ)
7.0000 mL | Freq: Four times a day (QID) | OROMUCOSAL | Status: DC
  Administered 2014-12-29 – 2014-12-31 (×4): 7 mL via OROMUCOSAL
  Filled 2014-12-28 (×14): qty 7

## 2014-12-28 NOTE — Progress Notes (Signed)
POST HD   12/28/14 2000  Neurological  Level of Consciousness Alert  Orientation Level Oriented X4  Respiratory  Respiratory Pattern Regular  Chest Assessment Chest expansion symmetrical  Bilateral Breath Sounds Diminished;Expiratory wheezes  Cough Non-productive  Cardiac  Pulse Regular  ECG Monitor Yes  Cardiac Rhythm ST  Vascular  Edema Generalized  Generalized Edema (NON PITTING)  Integumentary  Integumentary (WDL) WDL  Musculoskeletal  Musculoskeletal (WDL) X  Generalized Weakness Yes  Gastrointestinal  Bowel Sounds Assessment Hyperactive  GU Assessment  Genitourinary (WDL) WDL  Psychosocial  Psychosocial (WDL) WDL  Patient Behaviors Cooperative;Calm;Appropriate for situation  Needs Expressed Emotional;Physical  Emotional support given Given to patient

## 2014-12-28 NOTE — Progress Notes (Signed)
[facility]  ROUNDING NOTE   Subjective:   Brought to ED by family for shortness of breath and hypotension. Had hemodialysis yesterday.  Admitted to CCU. Placed on emergent hemodialysis. UF goal of 3 litres.   Objective:  Vital signs in last 24 hours:  Temp:  [97.6 F (36.4 C)-98 F (36.7 C)] 97.6 F (36.4 C) (09/09 1640) Pulse Rate:  [107-117] 107 (09/09 1700) Resp:  [13-28] 21 (09/09 1700) BP: (104-167)/(54-92) 162/76 mmHg (09/09 1700) SpO2:  [80 %-99 %] 98 % (09/09 1630) FiO2 (%):  [35 %-40 %] 35 % (09/09 1600) Weight:  [97.3 kg (214 lb 8.1 oz)] 97.3 kg (214 lb 8.1 oz) (09/09 1630)  Weight change:  Filed Weights   12/28/14 1630  Weight: 97.3 kg (214 lb 8.1 oz)    Intake/Output:     Intake/Output this shift:     Physical Exam: General: Critically ill  Head: Normocephalic, atraumatic. +BiPap  Eyes: Anicteric, PERRL  Neck: Supple, trachea midline  Lungs:  Bilateral crackles  Heart: Tachycardia, +systolic murmur  Abdomen:  Soft, nontender, obese  Extremities: 1+ peripheral edema.  Neurologic: Nonfocal, moving all four extremities  Skin: No lesions  Access: AVF left arm    Basic Metabolic Panel:  Recent Labs Lab 12/28/14 1321  NA 142  K 3.9  CL 99*  CO2 33*  GLUCOSE 107*  BUN 25*  CREATININE 4.95*  CALCIUM 9.2    Liver Function Tests: No results for input(s): AST, ALT, ALKPHOS, BILITOT, PROT, ALBUMIN in the last 168 hours. No results for input(s): LIPASE, AMYLASE in the last 168 hours. No results for input(s): AMMONIA in the last 168 hours.  CBC:  Recent Labs Lab 12/22/14 0453 12/28/14 1321  WBC  --  6.6  HGB 7.9* 7.6*  HCT  --  23.5*  MCV  --  102.6*  PLT  --  196    Cardiac Enzymes:  Recent Labs Lab 12/28/14 1321  TROPONINI 0.07*    BNP: Invalid input(s): POCBNP  CBG: No results for input(s): GLUCAP in the last 168 hours.  Microbiology: Results for orders placed or performed during the hospital encounter of  12/20/14  MRSA PCR Screening     Status: None   Collection Time: 12/21/14  2:13 AM  Result Value Ref Range Status   MRSA by PCR NEGATIVE NEGATIVE Final    Comment:        The GeneXpert MRSA Assay (FDA approved for NASAL specimens only), is one component of a comprehensive MRSA colonization surveillance program. It is not intended to diagnose MRSA infection nor to guide or monitor treatment for MRSA infections.     Coagulation Studies:  Recent Labs  12/28/14 1321  LABPROT 14.1  INR 1.07    Urinalysis: No results for input(s): COLORURINE, LABSPEC, PHURINE, GLUCOSEU, HGBUR, BILIRUBINUR, KETONESUR, PROTEINUR, UROBILINOGEN, NITRITE, LEUKOCYTESUR in the last 72 hours.  Invalid input(s): APPERANCEUR    Imaging: Dg Chest Port 1 View  12/28/2014   CLINICAL DATA:  Shortness of breath.  EXAM: PORTABLE CHEST - 1 VIEW  COMPARISON:  December 21, 2014.  FINDINGS: Stable cardiomediastinal silhouette is noted. No pneumothorax is noted. Slightly increased right infrahilar and basilar opacity is noted concerning for worsening pneumonia or edema. Stable left basilar opacity is noted concerning for edema or pneumonia. Mild central pulmonary vascular congestion is again noted and unchanged. No significant pleural effusion is noted.  IMPRESSION: Stable left basilar opacity is noted concerning for pneumonia or edema. Increased right infrahilar and basilar  opacity is noted concerning for worsening pneumonia or edema. Stable mild central pulmonary vascular congestion is noted.   Electronically Signed   By: Lupita Raider, M.D.   On: 12/28/2014 13:52     Medications:     . albumin human  25 g Intravenous Once  . budesonide-formoterol  2 puff Inhalation BID  . [START ON 12/29/2014] calcitRIOL  0.25 mcg Oral Daily  . calcium acetate  2,001 mg Oral TID WC  . [START ON 12/29/2014] cinacalcet  60 mg Oral Q breakfast  . citalopram  20 mg Oral Daily  . cyanocobalamin  1,000 mcg Intramuscular Q30 days  .  docusate sodium  100 mg Oral BID  . donepezil  5 mg Oral QHS  . heparin  5,000 Units Subcutaneous 3 times per day  . [START ON 12/29/2014] multivitamin with minerals  1 tablet Oral Daily  . pravastatin  20 mg Oral q1800  . traZODone  25-50 mg Oral QHS   acetaminophen, albuterol, ALPRAZolam, baclofen, benzonatate, chlorpheniramine-HYDROcodone, guaifenesin, hydrOXYzine, lidocaine-prilocaine, ondansetron **OR** ondansetron (ZOFRAN) IV, pantoprazole, promethazine, zolpidem  Assessment/ Plan:  [name] is a 70 y.o. white female with ESRD, SHPTH, AOCD, HTN, hyperlipidemia, COPD, DVT with PE, history of tx with long term coumadin and IVC filter placement, L knee TKA, R shoulder surgery, IVC filter removed Aug 2014, pnemonia 8/16, admitted with shortness of breath 9/16.  TTS [facility] [address]   1. ESRD on HD TTHS: Pulmonary vascular congestion noted on chest x-ray. Extra hemodialysis today emergently for pulmonary edema. Had regularly scheduled hemodialysis yesterday.  - Low salt diet - fluid restriction - Evaluate daily for dialysis need.   2. Hypotension: well controlled. Midodrine before treatment.  - IV albumin before treatment.   3. Anemia of chronic kidney disease. Hemoglobin 7.6, macrocytic - Continue epo with HD. TTS  4. Secondary hyperparathyroidism. Patient will continue on cinacalcet. She also gets hectorol on outpatient HD.  - discontinue calcitriol - on calcium acetate with meals.    LOS: 0 [name] 9/9/20165:08 PM

## 2014-12-28 NOTE — ED Notes (Addendum)
Dr. Fanny Bien made aware of positive trop of 0.07

## 2014-12-28 NOTE — ED Provider Notes (Signed)
[facility] Emergency Department Provider Note REMINDER - THIS NOTE IS NOT A FINAL MEDICAL RECORD UNTIL IT IS SIGNED. UNTIL THEN, THE CONTENT BELOW MAY REFLECT INFORMATION FROM A DOCUMENTATION TEMPLATE, NOT THE ACTUAL PATIENT VISIT. ____________________________________________  Time seen: Approximately 1:18 PM  I have reviewed the triage vital signs and the nursing notes.   HISTORY  Chief Complaint Shortness of Breath    HPI Ariel Smith is a 69 y.o. female history of congestive heart failure, COPD, end-stage renal disease. Patient reports she had dialysis yesterday and took a couple liters of fluid off. She then reports that last night she began feeling short of breath and this progressed throughout the day. She denies feeling like she is really wheezing, but has wheeze slightly. She does report she is been having symptoms like this frequently, and was admitted last week for similar. She also reports a history of blood clots, but was taken off blood thinner because of a bad bleeding episode, but does have an IVC filter in.  She denies chest pain. She states her legs are always swollen, do not seem any worse in usual. No fevers or chills. Nonproductive cough.   Past Medical History  Diagnosis Date  . Osteoarthritis   . Depression   . Frequent headaches   . HTN (hypertension)   . HLD (hyperlipidemia)   . History of colon polyps 2013    colonoscopy ([name])  . 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 Systolic murmur     mild  . GERD (gastroesophageal reflux disease)   . COPD (chronic obstructive pulmonary disease)   . ESRD (end stage renal disease) 08/2011    TThSa, L forearm AV fistula, [name]  . Secondary hyperparathyroidism of renal origin   . Osteopenia 01/2013  . Bronchiectasis 08/2014     suggested by thoracic xray  . Anemia   . Nonrheumatic aortic  valve stenosis     unspecified  . DVT (deep venous thrombosis)     Chronic    Patient Active Problem List   Diagnosis Date Noted  . Acute on chronic respiratory failure with hypoxemia 12/21/2014  . COPD (chronic obstructive pulmonary disease) 12/20/2014  . Acute on chronic respiratory failure with hypoxia 12/11/2014  . HCAP (healthcare-associated pneumonia) 12/11/2014  . Bleeding gastrointestinal   . Aortic valve stenosis, severe   . Chronic blood loss anemia   . COPD exacerbation 11/16/2014  . DVT (deep venous thrombosis) 11/16/2014  . Acute respiratory failure with hypoxemia 11/16/2014  . Aortic valvar stenosis 11/16/2014  . Acute respiratory distress   . UGI bleed 11/09/2014  . Vitamin D deficiency 11/03/2014  . Memory deficit 11/01/2014  . Right-sided thoracic back pain 08/17/2014  . Advanced care planning/counseling discussion 03/30/2014  . Health maintenance examination 03/30/2014  . Vitamin B12 deficiency 01/27/2014  . Bilateral leg edema 08/02/2013  . Medicare annual wellness visit, subsequent 01/27/2013  . Osteopenia 01/18/2013  . Chronic cough 10/22/2012  . Osteoarthritis   . Depression   . HTN (hypertension)   . HLD (hyperlipidemia)   . History of DVT of lower extremity   . GERD (gastroesophageal reflux disease)   . ESRD (end stage renal disease) on dialysis 08/19/2011    Past Surgical History  Procedure Laterality Date  . Cholecystectomy  2012  . Bunionectomy Left 2003  . Replacement total knee Right 2006  .  Shoulder arthroscopy Right 2009  . Colonoscopy  08/2011    colon biopsies, [name]  . Spirometry  04/2011    WNL  . US echocardiography  06/2008    EF >55%  . Esophagogastroduodenoscopy  08/2011    gastric cardia polyp  . Tonsillectomy  1955  . Tubal ligation  1980  . Exteriorization of a continuous ambulatory peritoneal dialysis catheter  01/2013    removal 12/2103 - [name]  . Dexa  01/2013    osteopenia with -2.0 at hip and spine  .  Hospitalization  12/2013    recurrent R pleural effusion due to peritoneal fluid translocation s/p rpt thoracentesis with 1.3 L fluid removed, ERSD started on HD this hospitalization  . US echocardiography  12/2013    EF 55-60%, nl LV sys fxn, mild-mod MR, AS, increased LV posterior wall thickness, mild TR  . Esophagogastroduodenoscopy Left 11/11/2014    Procedure: ESOPHAGOGASTRODUODENOSCOPY (EGD);  Surgeon: [name], MD;  Location: [facility] ENDOSCOPY;  Service: Endoscopy;  Laterality: Left;  . Peripheral vascular catheterization N/A 11/12/2014    Procedure: IVC Filter Insertion;  Surgeon: [name], MD;  Location: [facility] INVASIVE CV LAB;  Service: Cardiovascular;  Laterality: N/A;    Current Outpatient Rx  Name  Route  Sig  Dispense  Refill  . acetaminophen (TYLENOL) 500 MG tablet   Oral   Take 1,000 mg by mouth every 6 (six) hours as needed for mild pain or headache.          . albuterol (VENTOLIN HFA) 108 (90 BASE) MCG/ACT inhaler   Inhalation   Inhale 2 puffs into the lungs every 4 (four) hours as needed for wheezing or shortness of breath.          . ALPRAZolam (XANAX) 1 MG tablet   Oral   Take 1 tablet (1 mg total) by mouth 3 (three) times daily. Patient taking differently: Take 1 mg by mouth 3 (three) times daily as needed for anxiety.    30 tablet   0   . baclofen (LIORESAL) 10 MG tablet   Oral   Take 5-10 mg by mouth 2 (two) times daily as needed for muscle spasms.         . benzonatate (TESSALON) 100 MG capsule   Oral   Take 1 capsule (100 mg total) by mouth every 6 (six) hours as needed for cough.   20 capsule   0   . budesonide-formoterol (SYMBICORT) 160-4.5 MCG/ACT inhaler   Inhalation   Inhale 2 puffs into the lungs 2 (two) times daily.         . calcitRIOL (ROCALTROL) 0.25 MCG capsule   Oral   Take 0.25 mcg by mouth daily.         . calcium acetate (PHOSLO) 667 MG capsule   Oral   Take 2,001 mg by mouth 3 (three) times daily with meals.          .  chlorpheniramine-HYDROcodone (TUSSIONEX) 10-8 MG/5ML SUER   Oral   Take 5 mLs by mouth every 12 (twelve) hours as needed for cough.   140 mL   0   . cinacalcet (SENSIPAR) 60 MG tablet   Oral   Take 60 mg by mouth daily.         . citalopram (CELEXA) 20 MG tablet   Oral   Take 1 tablet (20 mg total) by mouth daily.   90 tablet   3   . cyanocobalamin (,VITAMIN B-12,) 1000  MCG/ML injection   Intramuscular   Inject 1,000 mcg into the muscle every 30 (thirty) days.         Marland Kitchen docusate sodium (COLACE) 100 MG capsule   Oral   Take 1 capsule (100 mg total) by mouth 2 (two) times daily.   10 capsule   0   . donepezil (ARICEPT) 5 MG tablet   Oral   Take 1 tablet (5 mg total) by mouth at bedtime.   30 tablet   3   . guaifenesin (ROBITUSSIN) 100 MG/5ML syrup   Oral   Take 10 mLs (200 mg total) by mouth every 6 (six) hours as needed for cough or congestion.   120 mL   0   . hydrOXYzine (ATARAX/VISTARIL) 25 MG tablet   Oral   Take 12.5-25 mg by mouth 2 (two) times daily as needed for anxiety.         . lidocaine-prilocaine (EMLA) cream   Topical   Apply 1 application topically as needed (topical anesthesia for hemodialysis if Gebauers and Lidocaine injection are ineffective.).   30 g   0   . lovastatin (MEVACOR) 20 MG tablet   Oral   Take 20 mg by mouth at bedtime.         . Multiple Vitamin (MULTIVITAMIN WITH MINERALS) TABS tablet   Oral   Take 1 tablet by mouth daily.   1 tablet   0   . pantoprazole (PROTONIX) 40 MG tablet   Oral   Take 40 mg by mouth daily as needed (for heartburn/indigestion).         . promethazine (PHENERGAN) 12.5 MG tablet   Oral   Take 12.5 mg by mouth every 6 (six) hours as needed for nausea or vomiting.         . traZODone (DESYREL) 50 MG tablet   Oral   Take 0.5-1 tablets (25-50 mg total) by mouth at bedtime. Patient taking differently: Take 25 mg by mouth at bedtime.    30 tablet   3   . zolpidem (AMBIEN) 5 MG tablet    Oral   Take 1 tablet (5 mg total) by mouth at bedtime as needed for sleep.   30 tablet   0   . Nutritional Supplements (FEEDING SUPPLEMENT, NEPRO CARB STEADY,) LIQD   Oral   Take 237 mLs by mouth 2 (two) times daily between meals. Patient not taking: Reported on 12/28/2014   1 Can   0     Allergies Aspirin and Nodolor  Family History  Problem Relation Age of Onset  . Deep vein thrombosis Brother   . Cancer Mother 77    colon  . Cancer Father     prostate  . Hypertension Father   . Hypertension Brother   . Stroke Mother   . Diabetes Neg Hx   . CAD Neg Hx   . Cancer Daughter 68    breast  . Dementia Father 27    Social History Social History  Substance Use Topics  . Smoking status: Former Smoker -- 2.00 packs/day for 30 years    Start date: 04/20/1978    Quit date: 04/20/2009  . Smokeless tobacco: Never Used  . Alcohol Use: No    Review of Systems Constitutional: No fever/chills Eyes: No visual changes. ENT: No sore throat. Cardiovascular: Denies chest pain. Respiratory: See history of present illness Gastrointestinal: No abdominal pain.  No nausea, no vomiting.  No diarrhea.  No constipation. Genitourinary: Negative for dysuria. Musculoskeletal:  Negative for back pain. Skin: Negative for rash. Neurological: Negative for headaches, focal weakness or numbness.  10-point ROS otherwise negative.  ____________________________________________   PHYSICAL EXAM:  VITAL SIGNS: ED Triage Vitals  Enc Vitals Group     BP 12/28/14 1309 153/92 mmHg     Pulse Rate 12/28/14 1309 113     Resp 12/28/14 1309 28     Temp 12/28/14 1309 98 F (36.7 C)     Temp Source 12/28/14 1309 Oral     SpO2 12/28/14 1309 80 %     Weight --      Height --      Head Cir --      Peak Flow --      Pain Score 12/28/14 1313 0     Pain Loc --      Pain Edu? --      Excl. in GC? --    Constitutional: Alert and oriented. Moderate increased work of breathing. Eyes: Conjunctivae are  normal. PERRL. EOMI. Head: Atraumatic. Nose: No congestion/rhinnorhea. Mouth/Throat: Mucous membranes are moist.  Oropharynx non-erythematous. Neck: No stridor.   Cardiovascular: Tachycardic rate, regular rhythm. Grossly normal heart sounds.  Good peripheral circulation. Respiratory: Moderate use of accessory muscles, moderate increased work of breathing, occasional end expiratory wheezing but seems to be taking overall good volume. She speaks in short phrases. Does not appear to be fatigued. Gastrointestinal: Soft and nontender. No distention. No abdominal bruits. No CVA tenderness. Musculoskeletal: No lower extremity tenderness nor edema.  No joint effusions. Neurologic:  Normal speech and language. No gross focal neurologic deficits are appreciated. No gait instability. Skin:  Skin is warm, dry and intact. No rash noted. Psychiatric: Mood and affect are normal. Speech and behavior are normal.  ____________________________________________   LABS (all labs ordered are listed, but only abnormal results are displayed)  Labs Reviewed  CBC - Abnormal; Notable for the following:    RBC 2.29 (*)    Hemoglobin 7.6 (*)    HCT 23.5 (*)    MCV 102.6 (*)    RDW 17.2 (*)    All other components within normal limits  BASIC METABOLIC PANEL - Abnormal; Notable for the following:    Chloride 99 (*)    CO2 33 (*)    Glucose, Bld 107 (*)    BUN 25 (*)    Creatinine, Ser 4.95 (*)    GFR calc non Af Amer 8 (*)    GFR calc Af Amer 9 (*)    All other components within normal limits  TROPONIN I - Abnormal; Notable for the following:    Troponin I 0.07 (*)    All other components within normal limits  BRAIN NATRIURETIC PEPTIDE - Abnormal; Notable for the following:    B Natriuretic Peptide 2712.0 (*)    All other components within normal limits  BLOOD GAS, VENOUS - Abnormal; Notable for the following:    pH, Ven 7.55 (*)    pCO2, Ven 43 (*)    Bicarbonate 37.6 (*)    Acid-Base Excess 13.7 (*)     All other components within normal limits  PROTIME-INR   ____________________________________________  EKG  ED ECG REPORT I, Prarthana Parlin, the attending physician, personally viewed and interpreted this ECG.  Date: 12/28/2014 EKG Time: 1315 Rate: 110 Rhythm: Sinus tachycardia QRS Axis: normal Intervals: normal ST/T Wave abnormalities: normal Conduction Disutrbances: none Narrative Interpretation: Sinus tachycardia without obvious ischemic change ____________________________________________  RADIOLOGY  DG Chest Port 1 View (Final result)  Result time: 12/28/14 13:52:28   Final result by Rad Results In Interface (12/28/14 13:52:28)   Narrative:   CLINICAL DATA: Shortness of breath.  EXAM: PORTABLE CHEST - 1 VIEW  COMPARISON: December 21, 2014.  FINDINGS: Stable cardiomediastinal silhouette is noted. No pneumothorax is noted. Slightly increased right infrahilar and basilar opacity is noted concerning for worsening pneumonia or edema. Stable left basilar opacity is noted concerning for edema or pneumonia. Mild central pulmonary vascular congestion is again noted and unchanged. No significant pleural effusion is noted.  IMPRESSION: Stable left basilar opacity is noted concerning for pneumonia or edema. Increased right infrahilar and basilar opacity is noted concerning for worsening pneumonia or edema. Stable mild central pulmonary vascular congestion is noted.     ____________________________________________   PROCEDURES  Procedure(s) performed: None  Critical Care performed: Yes, see critical care note(s)  CRITICAL CARE Performed by: Sharyn Creamer   Total critical care time: 45  Critical care time was exclusive of separately billable procedures and treating other patients.  Critical care was necessary to treat or prevent imminent or life-threatening deterioration.  Critical care was time spent personally by me on the following activities:  development of treatment plan with patient and/or surrogate as well as nursing, discussions with consultants, evaluation of patient's response to treatment, examination of patient, obtaining history from patient or surrogate, ordering and performing treatments and interventions, ordering and review of laboratory studies, ordering and review of radiographic studies, pulse oximetry and re-evaluation of patient's condition.  INITIAL IMPRESSION / ASSESSMENT AND PLAN / ED COURSE  Pertinent labs & imaging results that were available during my care of the patient were reviewed by me and considered in my medical decision making (see chart for details).  Patient presents with hypoxia even on home oxygen she desaturates to less than 80%. She has moderate increased work of breathing requiring emergent evaluation. She does correct rapidly on nonrebreather O2 saturation 100%, and we will have respiratory therapy titrate her down to a FiO2 which supports a sat in the low to mid 90s. I ordered a blood gas. She had dialysis yesterday which makes fluid overload seem less likely, she does have mild wheezing but does not appear to be frankly a COPD exacerbation. I would suspect that she is revealing with a combination of COPD and congestive heart failure. She does have a history of DVT but is not complaining of chest pain, in addition she has an IVC filter which she had theoretically should protect her from pulmonary emboli. She does have a history of recent and severe GI bleeding does not appear to have any symptoms of bleeding now. ____________________________________________  ----------------------------------------- 1:33 PM on 12/28/2014 -----------------------------------------  Patient continues to have moderate dyspnea and increased work of breathing. O2 saturation approximately 90-92% on nonrebreather at this time, I will place the patient on BiPAP for increased work of breathing as we continue to evaluate for  etiology. Respiratory therapy is seen and is evaluating the patient in conjunction.   ----------------------------------------- 2:38 PM on 12/28/2014 -----------------------------------------  Clinical data patient is improving. I have called and placed a consultation with Dr. Berneta Sages nephrology. Have paged hospitalist for admission. We will continue to monitor closely. Of note, the patient is afebrile and with a normal white count. I suspect this is mostly a presentation consistent with congestive heart failure though pneumonia is considered, I will defer to the hospitalist for further follow-up and care. I do not feel at the present time that the initiation of antibiotic's is  necessary unless the patient were to develop clinical symptoms of infection. ____________________________________________   FINAL CLINICAL IMPRESSION(S) / ED DIAGNOSES  Final diagnoses:  Acute pulmonary edema  Hypoxia      Sharyn Creamer, MD 12/28/14 1506

## 2014-12-28 NOTE — ED Notes (Signed)
RT placed pt on venti mask

## 2014-12-28 NOTE — Progress Notes (Signed)
Report received from ED RN Roselyn Meier. Patient being transported to CCU to receive dialysis tx.

## 2014-12-28 NOTE — H&P (Signed)
[facility] - [place] at [facility]   PATIENT NAME: Ariel Smith    MR#:  [id number]  DATE OF BIRTH:  [birth year]  DATE OF ADMISSION:  12/28/2014  PRIMARY CARE PHYSICIAN: [name], MD   REQUESTING/REFERRING PHYSICIAN: Dr. Sharyn Creamer.   CHIEF COMPLAINT:   Chief Complaint  Patient presents with  . Shortness of Breath    HISTORY OF PRESENT ILLNESS:  Ariel Smith  is a 69 y.o. female with a known history of end-stage renal disease on hemodialysis, severe aortic stenosis, hypertension, hyperlipidemia, GERD, history of previous DVT, secondary hyperparathyroidism, COPD, who presents to the hospital due to shortness of breath getting worse over the past 2 days. Patient was recently hospitalized about 5-6 days ago for CHF and volume overload. Patient was doing well at home until the past year or 2 she has had significant shortness of breath even on minimal exertion and also at rest. This morning when her daughter was speaking to on the phone she sounded really winded and therefore brought her to the ER further evaluation. In the emergency room patient was noted to be hypoxic with O2 sats in the mid 70s. Patient was noted to be in congestive heart failure and volume overload again and hospitalist services were contacted further treatment and evaluation. Patient admits to a cough which is productive with clear sputum, no fever, positive nausea and vomiting. Patient admits to some chest tightness but no obvious pain. Patient was noted to be in acute respiratory failure with hypoxia due to CHF and volume overload and therefore hospitalist services were contacted further treatment and evaluation.  PAST MEDICAL HISTORY:   Past Medical History  Diagnosis Date  . Osteoarthritis   . Depression   . Frequent headaches   . HTN (hypertension)   . HLD (hyperlipidemia)   . History of colon polyps 2013    colonoscopy ([name])  . History of DVT of lower extremity 2009   left sided x2, with PE s/p IVC filter placement (coumadin followed by HD center)  . Systolic murmur     mild  . GERD (gastroesophageal reflux disease)   . COPD (chronic obstructive pulmonary disease)   . ESRD (end stage renal disease) 08/2011    TThSa, L forearm AV fistula, [name]  . Secondary hyperparathyroidism of renal origin   . Osteopenia 01/2013  . Bronchiectasis 08/2014     suggested by thoracic xray  . Anemia   . Nonrheumatic aortic valve stenosis     unspecified  . DVT (deep venous thrombosis)     Chronic    PAST SURGICAL HISTORY:   Past Surgical History  Procedure Laterality Date  . Cholecystectomy  2012  . Bunionectomy Left 2003  . Replacement total knee Right 2006  . Shoulder arthroscopy Right 2009  . Colonoscopy  08/2011    colon biopsies, [name]  . Spirometry  04/2011    WNL  . US echocardiography  06/2008    EF >55%  . Esophagogastroduodenoscopy  08/2011    gastric cardia polyp  . Tonsillectomy  1955  . Tubal ligation  1980  . Exteriorization of a continuous ambulatory peritoneal dialysis catheter  01/2013    removal 12/2103 - [name]  . Dexa  01/2013    osteopenia with -2.0 at hip and spine  . Hospitalization  12/2013    recurrent R pleural effusion due to peritoneal fluid translocation s/p rpt thoracentesis with 1.3 L fluid removed, ERSD started on HD this hospitalization  .  US echocardiography  12/2013    EF 55-60%, nl LV sys fxn, mild-mod MR, AS, increased LV posterior wall thickness, mild TR  . Esophagogastroduodenoscopy Left 11/11/2014    Procedure: ESOPHAGOGASTRODUODENOSCOPY (EGD);  Surgeon: [name], MD;  Location: [facility] ENDOSCOPY;  Service: Endoscopy;  Laterality: Left;  . Peripheral vascular catheterization N/A 11/12/2014    Procedure: IVC Filter Insertion;  Surgeon: [name], MD;  Location: [facility] INVASIVE CV LAB;  Service: Cardiovascular;  Laterality: N/A;    SOCIAL HISTORY:   Social History  Substance Use Topics  . Smoking status:  Former Smoker -- 2.00 packs/day for 30 years    Start date: 04/20/1978    Quit date: 04/20/2009  . Smokeless tobacco: Never Used  . Alcohol Use: No    FAMILY HISTORY:   Family History  Problem Relation Age of Onset  . Deep vein thrombosis Brother   . Cancer Mother 79    colon  . Cancer Father     prostate  . Hypertension Father   . Hypertension Brother   . Stroke Mother   . Diabetes Neg Hx   . CAD Neg Hx   . Cancer Daughter 39    breast  . Dementia Father 59    DRUG ALLERGIES:   Allergies  Allergen Reactions  . Aspirin Other (See Comments)    Pt is unable to take because she has kidney disease.     Ariel Smith [Isometheptene-Dichloral-Apap] Other (See Comments)    Reaction:  Headaches     REVIEW OF SYSTEMS:   Review of Systems  Constitutional: Negative for fever and weight loss.  HENT: Negative for congestion, nosebleeds and tinnitus.   Eyes: Negative for blurred vision, double vision and redness.  Respiratory: Positive for cough and shortness of breath. Negative for hemoptysis.   Cardiovascular: Negative for chest pain, orthopnea, leg swelling and PND.  Gastrointestinal: Negative for nausea, vomiting, abdominal pain, diarrhea and melena.  Genitourinary: Negative for dysuria, urgency and hematuria.  Musculoskeletal: Negative for joint pain and falls.  Neurological: Negative for dizziness, tingling, sensory change, focal weakness, seizures, weakness and headaches.  Endo/Heme/Allergies: Negative for polydipsia. Does not bruise/bleed easily.  Psychiatric/Behavioral: Negative for depression and memory loss. The patient is not nervous/anxious.     MEDICATIONS AT HOME:   Prior to Admission medications   Medication Sig Start Date End Date Taking? Authorizing Provider  acetaminophen (TYLENOL) 500 MG tablet Take 1,000 mg by mouth every 6 (six) hours as needed for mild pain or headache.    Yes Historical Provider, MD  albuterol (VENTOLIN HFA) 108 (90 BASE) MCG/ACT inhaler  Inhale 2 puffs into the lungs every 4 (four) hours as needed for wheezing or shortness of breath.    Yes Historical Provider, MD  ALPRAZolam Prudy Feeler) 1 MG tablet Take 1 tablet (1 mg total) by mouth 3 (three) times daily. Patient taking differently: Take 1 mg by mouth 3 (three) times daily as needed for anxiety.  11/22/14  Yes Auburn Bilberry, MD  baclofen (LIORESAL) 10 MG tablet Take 5-10 mg by mouth 2 (two) times daily as needed for muscle spasms.   Yes Historical Provider, MD  benzonatate (TESSALON) 100 MG capsule Take 1 capsule (100 mg total) by mouth every 6 (six) hours as needed for cough. 11/22/14  Yes Auburn Bilberry, MD  budesonide-formoterol (SYMBICORT) 160-4.5 MCG/ACT inhaler Inhale 2 puffs into the lungs 2 (two) times daily.   Yes Historical Provider, MD  calcitRIOL (ROCALTROL) 0.25 MCG capsule Take 0.25 mcg by mouth  daily.   Yes Historical Provider, MD  calcium acetate (PHOSLO) 667 MG capsule Take 2,001 mg by mouth 3 (three) times daily with meals.    Yes Historical Provider, MD  chlorpheniramine-HYDROcodone (TUSSIONEX) 10-8 MG/5ML SUER Take 5 mLs by mouth every 12 (twelve) hours as needed for cough. 11/22/14  Yes Auburn Bilberry, MD  cinacalcet (SENSIPAR) 60 MG tablet Take 60 mg by mouth daily.   Yes Historical Provider, MD  citalopram (CELEXA) 20 MG tablet Take 1 tablet (20 mg total) by mouth daily. 03/30/14  Yes [name], MD  cyanocobalamin (,VITAMIN B-12,) 1000 MCG/ML injection Inject 1,000 mcg into the muscle every 30 (thirty) days.   Yes Historical Provider, MD  docusate sodium (COLACE) 100 MG capsule Take 1 capsule (100 mg total) by mouth 2 (two) times daily. 11/22/14  Yes Auburn Bilberry, MD  donepezil (ARICEPT) 5 MG tablet Take 1 tablet (5 mg total) by mouth at bedtime. 11/01/14  Yes [name], MD  guaifenesin (ROBITUSSIN) 100 MG/5ML syrup Take 10 mLs (200 mg total) by mouth every 6 (six) hours as needed for cough or congestion. 12/22/14  Yes Altamese Dilling, MD  hydrOXYzine  (ATARAX/VISTARIL) 25 MG tablet Take 12.5-25 mg by mouth 2 (two) times daily as needed for anxiety.   Yes Historical Provider, MD  lidocaine-prilocaine (EMLA) cream Apply 1 application topically as needed (topical anesthesia for hemodialysis if Gebauers and Lidocaine injection are ineffective.). 11/22/14  Yes Auburn Bilberry, MD  lovastatin (MEVACOR) 20 MG tablet Take 20 mg by mouth at bedtime.   Yes Historical Provider, MD  Multiple Vitamin (MULTIVITAMIN WITH MINERALS) TABS tablet Take 1 tablet by mouth daily. 11/22/14  Yes Auburn Bilberry, MD  pantoprazole (PROTONIX) 40 MG tablet Take 40 mg by mouth daily as needed (for heartburn/indigestion).   Yes Historical Provider, MD  promethazine (PHENERGAN) 12.5 MG tablet Take 12.5 mg by mouth every 6 (six) hours as needed for nausea or vomiting.   Yes Historical Provider, MD  traZODone (DESYREL) 50 MG tablet Take 0.5-1 tablets (25-50 mg total) by mouth at bedtime. Patient taking differently: Take 25 mg by mouth at bedtime.  11/01/14  Yes [name], MD  zolpidem (AMBIEN) 5 MG tablet Take 1 tablet (5 mg total) by mouth at bedtime as needed for sleep. 11/22/14  Yes Auburn Bilberry, MD  Nutritional Supplements (FEEDING SUPPLEMENT, NEPRO CARB STEADY,) LIQD Take 237 mLs by mouth 2 (two) times daily between meals. Patient not taking: Reported on 12/28/2014 11/22/14   Auburn Bilberry, MD      VITAL SIGNS:  Blood pressure 165/78, pulse 113, temperature 98 F (36.7 C), temperature source Oral, resp. rate 14, SpO2 93 %.  PHYSICAL EXAMINATION:  Physical Exam  GENERAL:  69 y.o.-year-old patient lying in the bed with mild respiratory distress.  EYES: Pupils equal, round, reactive to light and accommodation. No scleral icterus. Extraocular muscles intact.  HEENT: Head atraumatic, normocephalic. Oropharynx and nasopharynx clear. No oropharyngeal erythema, moist oral mucosa  NECK:  Supple, no jugular venous distention. No thyroid enlargement, no tenderness.  LUNGS: Normal  breath sounds bilaterally, no wheezing, rales, rhonchi. No use of accessory muscles of respiration.  CARDIOVASCULAR: S1, S2 2/6 systolic ejection murmur at the left sternal border. No murmurs, rubs, gallops, clicks.  ABDOMEN: Soft, nontender, nondistended. Bowel sounds present. No organomegaly or mass.  EXTREMITIES: +1 pedal edema bilaterally, no cyanosis, or clubbing. + 2 pedal & radial pulses b/l.   NEUROLOGIC: Cranial nerves II through XII are intact. No focal Motor or sensory deficits  appreciated b/l.  Globally weak PSYCHIATRIC: The patient is alert and oriented x 3. Good affect.  SKIN: No obvious rash, lesion, or ulcer.   LABORATORY PANEL:   CBC  Recent Labs Lab 12/28/14 1321  WBC 6.6  HGB 7.6*  HCT 23.5*  PLT 196   ------------------------------------------------------------------------------------------------------------------  Chemistries   Recent Labs Lab 12/28/14 1321  NA 142  K 3.9  CL 99*  CO2 33*  GLUCOSE 107*  BUN 25*  CREATININE 4.95*  CALCIUM 9.2   ------------------------------------------------------------------------------------------------------------------  Cardiac Enzymes  Recent Labs Lab 12/28/14 1321  TROPONINI 0.07*   ------------------------------------------------------------------------------------------------------------------  RADIOLOGY:  Dg Chest Port 1 View  12/28/2014   CLINICAL DATA:  Shortness of breath.  EXAM: PORTABLE CHEST - 1 VIEW  COMPARISON:  December 21, 2014.  FINDINGS: Stable cardiomediastinal silhouette is noted. No pneumothorax is noted. Slightly increased right infrahilar and basilar opacity is noted concerning for worsening pneumonia or edema. Stable left basilar opacity is noted concerning for edema or pneumonia. Mild central pulmonary vascular congestion is again noted and unchanged. No significant pleural effusion is noted.  IMPRESSION: Stable left basilar opacity is noted concerning for pneumonia or edema. Increased  right infrahilar and basilar opacity is noted concerning for worsening pneumonia or edema. Stable mild central pulmonary vascular congestion is noted.   Electronically Signed   By: Lupita Raider, M.D.   On: 12/28/2014 13:52     IMPRESSION AND PLAN:   69 year old female with past history of end-stage renal disease on hemodialysis, hypertension, severe aortic stenosis, GERD, history of previous DVT status post IVC filter, hyperlipidemia, anxiety who presents to the hospital due to shortness of breath and noted to be in acute respiratory failure with hypoxia.  #1 acute respiratory failure with hypoxia-this is likely secondary to volume overload and CHF. -Patient will get urgent hemodialysis for fluid removal and follow the patient clinically. -Patient is currently on BiPAP and will be admitted to stepdown and will wean off BiPAP and O2 as tolerated.  #2 CHF/volume overload-acute on chronic diastolic dysfunction. The exact etiology of this is unclear. -Suspected to be secondary to possible severe aortic stenosis. [facility] consult cardiology. -Patient to have urgent dialysis to have fluid removed and will follow patient clinically.  #3 severe aortic stenosis-patient likely not a good candidate for urgent surgical intervention given advanced age and comorbidities. -Continue supportive care for now. We'll get cardiology consult.  #4 end-stage renal disease on hemodialysis-patient to get urgent hemodialysis presently due to volume overload. [facility] consult nephrology and continue maintenance dialysis on Tuesday Thursday Saturday.  #5 COPD-no acute exacerbation -continue Symbicort, albuterol inhaler as needed.  #6 depression-continue Celexa.  #7 secondary hyperparathyroidism-continue PhosLo, Sensipar  #8 GERD-continue Protonix  #9 hyperlipidemia-continue Pravachol.    All the records are reviewed and case discussed with ED provider. Management plans discussed with the patient, family and  they are in agreement.  CODE STATUS: Full  TOTAL TIME TAKING CARE OF THIS PATIENT: 50 minutes.    Houston Siren M.D on 12/28/2014 at 4:03 PM  Between 7am to 6pm - Pager - [phone]  After 6pm go to [email or web address] - password [email or web address] [facility]  [place] [facility]  Office  [phone]  CC: Primary care physician; [name], MD

## 2014-12-28 NOTE — Progress Notes (Signed)
HD END 

## 2014-12-28 NOTE — Telephone Encounter (Signed)
noted 

## 2014-12-28 NOTE — ED Notes (Signed)
Pt placed on bipap  

## 2014-12-28 NOTE — Progress Notes (Signed)
   12/28/14 1800  Clinical Encounter Type  Visited With Patient and family together  Visit Type Initial  Referral From Family;Nurse  Consult/Referral To Chaplain  Spiritual Encounters  Spiritual Needs Literature;Emotional  Advance Directives (For Healthcare)  Does patient have an advance directive? No  Would patient like information on creating an advanced directive? Yes - Editor, commissioning education given.  No witness or notary available tonight.  Will have on call chaplain follow up tomorrow.  [employer] Johari Bennetts-pager [phone]

## 2014-12-28 NOTE — Progress Notes (Signed)
POST HD    12/28/14 2000  Report  Report Received From [facility], RN  Vital Signs  Temp 97.8 F (36.6 C)  Temp Source Axillary  Pulse Rate (!) 101  Pulse Rate Source Monitor  Resp 19  BP 139/76 mmHg  BP Location Right Leg  BP Method Automatic  Patient Position (if appropriate) Lying  Dialysis Weight  Weight 93.9 kg (207 lb 0.2 oz)  Type of Weight Post-Dialysis  During Hemodialysis Assessment  Intra-Hemodialysis Comments HD TREATMENT END.  BLOOD RETURNED AND 15G NEEDLES REMOVED PER POLICY.  PT ALERT AND VS STABLE.    Post-Hemodialysis Assessment  Rinseback Volume (mL) 250 mL  Dialyzer Clearance Heavily streaked  Duration of HD Treatment -hour(s) 3 hour(s)  Hemodialysis Intake (mL) 500 mL  UF Total -Machine (mL) 3500 mL  Net UF (mL) 3000 mL  Tolerated HD Treatment Yes  Post-Hemodialysis Comments HD TREATMENT END.  BLOOD RETURNED AND 15G NEEDLES REMOVED PER POLICY.  PT ALERT AND VS STABLE.    AVG/AVF Arterial Site Held (minutes) 10 minutes  AVG/AVF Venous Site Held (minutes) 10 minutes  Education / Care Plan  Hemodialysis Education Provided Yes  Documented Education in Clinical Pathway Yes  Fistula / Graft Left Forearm Arteriovenous fistula  No Placement Date or Time found.   Placed prior to admission: Yes  Orientation: Left  Access Location: Forearm  Access Type: Arteriovenous fistula  Site Condition No complications  Fistula / Graft Assessment Present;Thrill;Bruit  Status Deaccessed  Needle Size 15  Drainage Description None

## 2014-12-28 NOTE — Telephone Encounter (Signed)
Patient is in [facility] ed now for sob and daughter wants patient to be seen by [name] sooner than scheduled appt with Alycia Rossetti on 01/11/15.    Patient daughter wants referral  to see pulmonologist as well as she has dc service with Dr. Meredeth Ide at [facility].     Please call patient daughter to discuss.    Scheduled with Ramachandran on 12/31/14 .    Scheduled with [name] 01/01/15 .

## 2014-12-28 NOTE — Consult Note (Signed)
Cardiology Consultation Note  Patient ID: Ariel Smith, MRN: [id number], DOB/AGE: 68-Sep-1947 68 y.o. Admit date: 12/28/2014   Date of Consult: 12/28/2014 Primary Physician: [name], MD Primary Cardiologist:   Chief Complaint: Shortness of breath Reason for Consult: Congestive heart failure, acute on chronic  HPI: 69 y.o. female with h/o end-stage renal disease, on hemodialysis, followed by [name], moderate to severe aortic valve stenosis seen on recent echocardiogram, GI bleed the beginning of August 2016, her warfarin held, hypertension, previous DVT/PE, COPD presenting for shortness of breath over the past 2 days.  Recently hospitalized for similar symptoms, improved with dialysis. Notes indicate she was doing well following discharge but family noted she became more short of breath She was brought to the emergency room, found to be hypoxic with oxygen saturations in the mid 70s. Patient has BiPAP in place and difficult to obtain a full history. She has completed hemodialysis's evening with 3.5 L removed with improvement of her symptoms.  She does report some cough, clear, no significant chest discomfort She denies missing any days of hemodialysis Does not appear that she has been seen by cardiology in the past  She is on warfarin for history of DVT and PE , status post IVC filter  anemia on presentation, off warfarin  Admitted at the end of August beginning of September. 3 admissions in the past several weeks    Past Medical History  Diagnosis Date  . Osteoarthritis   . Depression   . Frequent headaches   . HTN (hypertension)   . HLD (hyperlipidemia)   . History of colon polyps 2013    colonoscopy ([name])  . 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 Systolic murmur     mild  . GERD (gastroesophageal reflux disease)   . COPD (chronic obstructive pulmonary disease)   .  ESRD (end stage renal disease) 08/2011    TThSa, L forearm AV fistula, [name]  . Secondary hyperparathyroidism of renal origin   . Osteopenia 01/2013  . Bronchiectasis 08/2014     suggested by thoracic xray  . Anemia   . Nonrheumatic aortic valve stenosis     unspecified  . DVT (deep venous thrombosis)     Chronic      Most Recent Cardiac Studies:    Surgical History:  Past Surgical History  Procedure Laterality Date  . Cholecystectomy  2012  . Bunionectomy Left 2003  . Replacement total knee Right 2006  . Shoulder arthroscopy Right 2009  . Colonoscopy  08/2011    colon biopsies, [name]  . Spirometry  04/2011    WNL  . US echocardiography  06/2008    EF >55%  . Esophagogastroduodenoscopy  08/2011    gastric cardia polyp  . Tonsillectomy  1955  . Tubal ligation  1980  . Exteriorization of a continuous ambulatory peritoneal dialysis catheter  01/2013    removal 12/2103 - [name]  . Dexa  01/2013    osteopenia with -2.0 at hip and spine  . Hospitalization  12/2013    recurrent R pleural effusion due to peritoneal fluid translocation s/p rpt thoracentesis with 1.3 L fluid removed, ERSD started on HD this hospitalization  . US echocardiography  12/2013    EF 55-60%, nl LV sys fxn, mild-mod MR, AS, increased LV posterior wall thickness, mild TR  . Esophagogastroduodenoscopy Left 11/11/2014  Procedure: ESOPHAGOGASTRODUODENOSCOPY (EGD);  Surgeon: [name], MD;  Location: [facility] ENDOSCOPY;  Service: Endoscopy;  Laterality: Left;  . Peripheral vascular catheterization N/A 11/12/2014    Procedure: IVC Filter Insertion;  Surgeon: [name], MD;  Location: [facility] INVASIVE CV LAB;  Service: Cardiovascular;  Laterality: N/A;     Home Meds: Prior to Admission medications   Medication Sig Start Date End Date Taking? Authorizing Provider  acetaminophen (TYLENOL) 500 MG tablet Take 1,000 mg by mouth every 6 (six) hours as needed for mild pain or headache.    Yes Historical Provider, MD    albuterol (VENTOLIN HFA) 108 (90 BASE) MCG/ACT inhaler Inhale 2 puffs into the lungs every 4 (four) hours as needed for wheezing or shortness of breath.    Yes Historical Provider, MD  ALPRAZolam Prudy Feeler) 1 MG tablet Take 1 tablet (1 mg total) by mouth 3 (three) times daily. Patient taking differently: Take 1 mg by mouth 3 (three) times daily as needed for anxiety.  11/22/14  Yes Auburn Bilberry, MD  baclofen (LIORESAL) 10 MG tablet Take 5-10 mg by mouth 2 (two) times daily as needed for muscle spasms.   Yes Historical Provider, MD  benzonatate (TESSALON) 100 MG capsule Take 1 capsule (100 mg total) by mouth every 6 (six) hours as needed for cough. 11/22/14  Yes Auburn Bilberry, MD  budesonide-formoterol (SYMBICORT) 160-4.5 MCG/ACT inhaler Inhale 2 puffs into the lungs 2 (two) times daily.   Yes Historical Provider, MD  calcitRIOL (ROCALTROL) 0.25 MCG capsule Take 0.25 mcg by mouth daily.   Yes Historical Provider, MD  calcium acetate (PHOSLO) 667 MG capsule Take 2,001 mg by mouth 3 (three) times daily with meals.    Yes Historical Provider, MD  chlorpheniramine-HYDROcodone (TUSSIONEX) 10-8 MG/5ML SUER Take 5 mLs by mouth every 12 (twelve) hours as needed for cough. 11/22/14  Yes Auburn Bilberry, MD  cinacalcet (SENSIPAR) 60 MG tablet Take 60 mg by mouth daily.   Yes Historical Provider, MD  citalopram (CELEXA) 20 MG tablet Take 1 tablet (20 mg total) by mouth daily. 03/30/14  Yes [name], MD  cyanocobalamin (,VITAMIN B-12,) 1000 MCG/ML injection Inject 1,000 mcg into the muscle every 30 (thirty) days.   Yes Historical Provider, MD  docusate sodium (COLACE) 100 MG capsule Take 1 capsule (100 mg total) by mouth 2 (two) times daily. 11/22/14  Yes Auburn Bilberry, MD  donepezil (ARICEPT) 5 MG tablet Take 1 tablet (5 mg total) by mouth at bedtime. 11/01/14  Yes [name], MD  guaifenesin (ROBITUSSIN) 100 MG/5ML syrup Take 10 mLs (200 mg total) by mouth every 6 (six) hours as needed for cough or  congestion. 12/22/14  Yes Altamese Dilling, MD  hydrOXYzine (ATARAX/VISTARIL) 25 MG tablet Take 12.5-25 mg by mouth 2 (two) times daily as needed for anxiety.   Yes Historical Provider, MD  lidocaine-prilocaine (EMLA) cream Apply 1 application topically as needed (topical anesthesia for hemodialysis if Gebauers and Lidocaine injection are ineffective.). 11/22/14  Yes Auburn Bilberry, MD  lovastatin (MEVACOR) 20 MG tablet Take 20 mg by mouth at bedtime.   Yes Historical Provider, MD  Multiple Vitamin (MULTIVITAMIN WITH MINERALS) TABS tablet Take 1 tablet by mouth daily. 11/22/14  Yes Auburn Bilberry, MD  pantoprazole (PROTONIX) 40 MG tablet Take 40 mg by mouth daily as needed (for heartburn/indigestion).   Yes Historical Provider, MD  promethazine (PHENERGAN) 12.5 MG tablet Take 12.5 mg by mouth every 6 (six) hours as needed for nausea or vomiting.   Yes Historical  Provider, MD  traZODone (DESYREL) 50 MG tablet Take 0.5-1 tablets (25-50 mg total) by mouth at bedtime. Patient taking differently: Take 25 mg by mouth at bedtime.  11/01/14  Yes [name], MD  zolpidem (AMBIEN) 5 MG tablet Take 1 tablet (5 mg total) by mouth at bedtime as needed for sleep. 11/22/14  Yes Auburn Bilberry, MD  Nutritional Supplements (FEEDING SUPPLEMENT, NEPRO CARB STEADY,) LIQD Take 237 mLs by mouth 2 (two) times daily between meals. Patient not taking: Reported on 12/28/2014 11/22/14   Auburn Bilberry, MD    Inpatient Medications:  . [START ON 12/29/2014] antiseptic oral rinse  7 mL Mouth Rinse QID  . budesonide-formoterol  2 puff Inhalation BID  . calcium acetate  2,001 mg Oral TID WC  . chlorhexidine gluconate  15 mL Mouth Rinse BID  . [START ON 12/29/2014] cinacalcet  60 mg Oral Q breakfast  . citalopram  20 mg Oral Daily  . cyanocobalamin  1,000 mcg Intramuscular Q30 days  . docusate sodium  100 mg Oral BID  . donepezil  5 mg Oral QHS  . heparin  5,000 Units Subcutaneous 3 times per day  . [START ON 12/29/2014]  Influenza vac split quadrivalent PF  0.5 mL Intramuscular Tomorrow-1000  . [START ON 12/29/2014] multivitamin with minerals  1 tablet Oral Daily  . pravastatin  20 mg Oral q1800  . traZODone  25-50 mg Oral QHS      Allergies:  Allergies  Allergen Reactions  . Aspirin Other (See Comments)    Pt is unable to take because she has kidney disease.     Gaspar Skeeters [Isometheptene-Dichloral-Apap] Other (See Comments)    Reaction:  Headaches     Social History   Social History  . Marital Status: Married    Spouse Name: N/A  . Number of Children: N/A  . Years of Education: N/A   Occupational History  . Not on file.   Social History Main Topics  . Smoking status: Former Smoker -- 2.00 packs/day for 30 years    Start date: 04/20/1978    Quit date: 04/20/2009  . Smokeless tobacco: Never Used  . Alcohol Use: No  . Drug Use: No  . Sexual Activity: Not on file   Other Topics Concern  . Not on file   Social History Narrative     Family History  Problem Relation Age of Onset  . Deep vein thrombosis Brother   . Cancer Mother 35    colon  . Cancer Father     prostate  . Hypertension Father   . Hypertension Brother   . Stroke Mother   . Diabetes Neg Hx   . CAD Neg Hx   . Cancer Daughter 63    breast  . Dementia Father 41     Review of Systems: Unable to perform review of systems as patient on BiPAP Review of Systems  Unable to perform ROS: medical condition  All other systems reviewed and are negative.   Labs:  Recent Labs  12/28/14 1321  TROPONINI 0.07*   Lab Results  Component Value Date   WBC 6.6 12/28/2014   HGB 7.6* 12/28/2014   HCT 23.5* 12/28/2014   MCV 102.6* 12/28/2014   PLT 196 12/28/2014    Recent Labs Lab 12/28/14 1321  NA 142  K 3.9  CL 99*  CO2 33*  BUN 25*  CREATININE 4.95*  CALCIUM 9.2  GLUCOSE 107*   Lab Results  Component Value Date   CHOL 155  03/30/2014   HDL 33* 03/30/2014   LDLCALC 67 03/30/2014   TRIG 275* 03/30/2014    No results found for: DDIMER  Radiology/Studies:  Dg Chest 2 View  12/20/2014   CLINICAL DATA:  End-stage renal disease.  Difficulty breathing.  EXAM: CHEST  2 VIEW  COMPARISON:  PA and lateral chest 12/14/2014. Single view of the chest 11/12/2014 and CT chest 11/12/2014.  FINDINGS: There is cardiomegaly and vascular congestion. Small left pleural effusion is noted. Streaky left basilar airspace disease is identified.  IMPRESSION: Small left pleural effusion streaky left basilar airspace disease which could be atelectasis or pneumonia.  Cardiomegaly and vascular congestion.   Electronically Signed   By: Drusilla Kanner M.D.   On: 12/20/2014 18:06   Dg Chest 2 View  12/14/2014   CLINICAL DATA:  Shortness of Breath  EXAM: CHEST - 2 VIEW  COMPARISON:  12/11/2014  FINDINGS: Cardiac shadow is stable. The lungs are well aerated bilaterally. The previously seen right basilar infiltrate has shown some mild improvement when compare with the prior exam. No new focal infiltrate is seen.  IMPRESSION: Slight improved aeration in the right lung base.   Electronically Signed   By: Alcide Clever M.D.   On: 12/14/2014 08:00   Dg Chest Port 1 View  12/28/2014   CLINICAL DATA:  Shortness of breath.  EXAM: PORTABLE CHEST - 1 VIEW  COMPARISON:  December 21, 2014.  FINDINGS: Stable cardiomediastinal silhouette is noted. No pneumothorax is noted. Slightly increased right infrahilar and basilar opacity is noted concerning for worsening pneumonia or edema. Stable left basilar opacity is noted concerning for edema or pneumonia. Mild central pulmonary vascular congestion is again noted and unchanged. No significant pleural effusion is noted.  IMPRESSION: Stable left basilar opacity is noted concerning for pneumonia or edema. Increased right infrahilar and basilar opacity is noted concerning for worsening pneumonia or edema. Stable mild central pulmonary vascular congestion is noted.   Electronically Signed   By: Lupita Raider,  M.D.   On: 12/28/2014 13:52   Portable Chest 1 View  12/21/2014   CLINICAL DATA:  Respiratory failure, followup  EXAM: PORTABLE CHEST - 1 VIEW  COMPARISON:  Chest x-ray of 12/20/2014  FINDINGS: The lungs remain poorly aerated with indistinct perihilar vasculature suggesting mild pulmonary vascular congestion. No definite pleural effusion is seen. Cardiomegaly is stable.  IMPRESSION: Cardiomegaly and probable worsening of pulmonary vascular congestion.   Electronically Signed   By: Dwyane Dee M.D.   On: 12/21/2014 07:59   Dg Chest Portable 1 View  12/11/2014   CLINICAL DATA:  Shortness of breath with decreased oxygen saturation.  EXAM: PORTABLE CHEST - 1 VIEW  COMPARISON:  11/18/2014  FINDINGS: Cardiac enlargement without vascular congestion. Increasing infiltration in the right mid and lower lung suggesting pneumonia. No blunting of costophrenic angles. No pneumothorax.  IMPRESSION: Increased right lung infiltrates suggesting pneumonia. Cardiac enlargement.   Electronically Signed   By: Burman Nieves M.D.   On: 12/11/2014 02:06    EKG: Sinus tachycardia with rate 110 bpm, no significant ST or T-wave changes  Weights: Filed Weights   12/28/14 1630  Weight: 214 lb 8.1 oz (97.3 kg)     Physical Exam: Telemetry showing sinus tachycardia  Blood pressure 148/79, pulse 99, temperature 97.6 F (36.4 C), temperature source Axillary, resp. rate 14, weight 214 lb 8.1 oz (97.3 kg), SpO2 98 %. Body mass index is 36.8 kg/(m^2). General: well nourished,supine, BiPAP in place  Head: Normocephalic, atraumatic, sclera  non-icteric, no xanthomas, nares are without discharge.  Neck: Negative for carotid bruits. JVD not elevated. Lungs:  Rhonchi bases b/l. Breathing is mildly labored. Heart: RRR with S1 S2. No murmurs, rubs, or gallops appreciated.Tachycardia  Abdomen: Soft, non-tender, non-distended with normoactive bowel sounds. No hepatomegaly. No rebound/guarding. No obvious abdominal masses. Msk:   Strength and tone appear normal for age. Extremities: No clubbing or cyanosis. No edema.  Distal pedal pulses are 2+ and equal bilaterally. Neuro: Alert and oriented X 3. No facial asymmetry. No focal deficit. Moves all extremities spontaneously. Psych:  Responds to questions appropriately with a normal affect.    Assessment and Plan:   69 year old female with past history of end-stage renal disease on hemodialysis, hypertension, severe aortic stenosis, GERD, history of previous DVT status post IVC filter, hyperlipidemia, anxiety who presents to the hospital due to shortness of breath and noted to be in acute respiratory failure with hypoxia.   1) acute respiratory failure with hypoxia Etiology secondary to acute on chronic diastolic CHF Contribution from her underlying anemia, fluid overload, severe aortic valve stenosis, in the setting of sinus tachycardia ---Ideally needs a lower dry weight given her frequent readmissions. May need education on decreasing her fluid intake as an outpatient. If she makes urine, could add diuretics on nondialysis days. --Aortic valve stenosis needs to be addressed. May be a candidate for TAVR. Would not be a good surgical candidate -Patient is currently on BiPAP and will be admitted to stepdown and will wean off BiPAP and O2 as tolerated.  #2 CHF/volume overload- acute on chronic diastolic dysfunction.  --Needs a lower dry weight, Epogen for anemia. Already feeling better this evening after 3.5 L removed following dialysis  #3 severe aortic stenosis- Could be considered for TAVR.  Would certainly need aggressive hemodialysis and improve shortness of breath before she could be considered a candidate. Workup would include CT scan and time in the catheterization lab, all which would require her to lay supine   #4 end-stage renal disease on hemodialysis-  dialysis on Tuesday Thursday Saturday. Given frequent admissions over the past month, need lower dry  weight  #5 COPD- no acute exacerbation -continue Symbicort, albuterol inhaler as needed.  #6 depression- continue Celexa.  #7 secondary hyperparathyroidism- continue PhosLo, Sensipar  #8 GERD- continue Protonix  #9 hyperlipidemia-continue Pravachol.    Signed, Dossie Arbour, MD [facility] 12/28/2014, 7:51 PM

## 2014-12-28 NOTE — ED Notes (Signed)
Pt awake and alert, and family at bedside, pt states she is agreeable to try bipap, pt able to answer questions

## 2014-12-28 NOTE — ED Notes (Signed)
Pt to ed via ems from home with c/o sob and difficulty breathing, pt with hx of COPD.  Pt on o2 at home at 3 lpm. sats at 80% on 3 lpm.  Increased o2 up to 4lpm, dr Fanny Bien called to bedside.  Pt on dialysis, received dialysis yesterday.

## 2014-12-28 NOTE — Progress Notes (Signed)
Patient active with [facility] skilled nursing, physical therapy, & occupational therapy services. Will need resumption orders at discharge if patients is admitted.

## 2014-12-28 NOTE — Progress Notes (Signed)
PRE HD   12/28/14 1630  Vital Signs  Temp 97.6 F (36.4 C)  Temp Source Axillary  Pulse Rate (!) 114  Pulse Rate Source Monitor  Resp (!) 22  BP (!) 162/80 mmHg  BP Location Right Leg  BP Method Automatic  Patient Position (if appropriate) Lying  Oxygen Therapy  SpO2 98 %  O2 Device Bi-PAP  Pain Assessment  Pain Assessment No/denies pain  Pain Score 0  Dialysis Weight  Weight 97.3 kg (214 lb 8.1 oz)  Type of Weight Pre-Dialysis  Time-Out for Hemodialysis  What Procedure? hemodialysis  Pt Identifiers(min of two) First/Last Name;MRN/Account#  Correct Site? Yes  Correct Side? Yes  Correct Procedure? Yes  Consents Verified? Yes  Rad Studies Available? N/A  Safety Precautions Reviewed? Yes  Machine Checks  Machine Number [id number]  UF/Alarm Test Passed  Conductivity: Meter 13.8  Conductivity: Machine  14.1  pH 7.4  Reverse Osmosis 1364  Dialyzer Lot Number [id number]  Disposable Set Lot Number [id number]  Machine Temperature 98.6 F (37 C)  Immunologist and Audible Yes  Blood Lines Intact and Secured Yes  Pre Treatment Patient Checks  Vascular access used during treatment Fistula  Hepatitis B Surface Antibody 10.8  Date Hepatitis B Surface Antibody Drawn 11/11/14  Hemodialysis Consent Verified Yes  Hemodialysis Standing Orders Initiated Yes  ECG (Telemetry) Monitor On Yes  Prime Ordered Normal Saline  Length of  DialysisTreatment -hour(s) 3 Hour(s)  Dialysis Treatment Comments HD TREATMENT INITITATED. GOAL TO REMOVE 3L OVER 3 HOURS.  PT  ALERT AND VS STABLE.  PT GIVEN BP SUPPORT, SEE MAR.    Dialyzer Optiflux 180 NR  Dialysate 2K, 2.5 Ca  Dialysis Anticoagulant None  Dialysate Flow Ordered 600  Blood Flow Rate Ordered 400 mL/min  Ultrafiltration Goal 3 Liters  Pre Treatment Labs Renal panel;Hepatitis B Surface Antigen;CBC  Dialysis Blood Pressure Support Ordered Normal Saline  Fistula / Graft Left Forearm Arteriovenous fistula  No Placement Date or Time  found.   Placed prior to admission: Yes  Orientation: Left  Access Location: Forearm  Access Type: Arteriovenous fistula  Site Condition No complications  Fistula / Graft Assessment Present;Thrill;Bruit  Status Accessed  Needle Size 15  Drainage Description None

## 2014-12-28 NOTE — Progress Notes (Signed)
HD START 

## 2014-12-28 NOTE — Progress Notes (Signed)
PRE HD   12/28/14 1630  Neurological  Level of Consciousness Alert  Orientation Level Oriented X4  Respiratory  Respiratory Pattern Regular  Chest Assessment Chest expansion symmetrical  Bilateral Breath Sounds Diminished;Expiratory wheezes  Cough Non-productive  Cardiac  Pulse Regular  Heart Sounds S1, S2;Murmur  Cardiac Rhythm ST  Vascular  R Radial Pulse +2  L Radial Pulse +2  R Dorsalis Pedis Pulse +1  L Dorsalis Pedis Pulse +1  Edema Generalized  Generalized Edema Other (Comment) (non-pitting)  LLE Edema +1  Integumentary  Integumentary (WDL) X  Skin Color Appropriate for ethnicity  Skin Condition Dry  Musculoskeletal  Musculoskeletal (WDL) X  Generalized Weakness Yes  Gastrointestinal  Bowel Sounds Assessment Active  Last BM Date 12/27/14  GU Assessment  Genitourinary (WDL) WDL  Psychosocial  Psychosocial (WDL) X  Patient Behaviors Anxious;Cooperative  Needs Expressed Emotional;Physical  Emotional support given Given to patient

## 2014-12-28 NOTE — Telephone Encounter (Signed)
Patient's husband called to cancel patient's appointment for this afternoon.  Patient is going to [facility] ER.

## 2014-12-29 DIAGNOSIS — I5033 Acute on chronic diastolic (congestive) heart failure: Secondary | ICD-10-CM | POA: Diagnosis not present

## 2014-12-29 DIAGNOSIS — J810 Acute pulmonary edema: Secondary | ICD-10-CM

## 2014-12-29 LAB — BASIC METABOLIC PANEL
Anion gap: 9 (ref 5–15)
BUN: 17 mg/dL (ref 6–20)
CALCIUM: 9.1 mg/dL (ref 8.9–10.3)
CO2: 35 mmol/L — ABNORMAL HIGH (ref 22–32)
CREATININE: 3.19 mg/dL — AB (ref 0.44–1.00)
Chloride: 100 mmol/L — ABNORMAL LOW (ref 101–111)
GFR calc Af Amer: 16 mL/min — ABNORMAL LOW (ref >60)
GFR, EST NON AFRICAN AMERICAN: 14 mL/min — AB (ref >60)
Glucose, Bld: 140 mg/dL — ABNORMAL HIGH (ref 65–99)
POTASSIUM: 3.9 mmol/L (ref 3.5–5.1)
SODIUM: 144 mmol/L (ref 135–145)

## 2014-12-29 LAB — CBC
HEMATOCRIT: 20.8 % — AB (ref 35.0–47.0)
Hemoglobin: 6.8 g/dL — ABNORMAL LOW (ref 12.0–16.0)
MCH: 33.8 pg (ref 26.0–34.0)
MCHC: 32.9 g/dL (ref 32.0–36.0)
MCV: 102.9 fL — ABNORMAL HIGH (ref 80.0–100.0)
PLATELETS: 164 10*3/uL (ref 150–440)
RBC: 2.02 MIL/uL — ABNORMAL LOW (ref 3.80–5.20)
RDW: 16.9 % — AB (ref 11.5–14.5)
WBC: 5.8 10*3/uL (ref 3.6–11.0)

## 2014-12-29 LAB — PREPARE RBC (CROSSMATCH)

## 2014-12-29 MED ORDER — EPOETIN ALFA 10000 UNIT/ML IJ SOLN
10000.0000 [IU] | Freq: Once | INTRAMUSCULAR | Status: AC
  Administered 2014-12-29: 10000 [IU] via INTRAVENOUS
  Filled 2014-12-29: qty 1

## 2014-12-29 MED ORDER — SODIUM CHLORIDE 0.9 % IV SOLN: Freq: Once | INTRAVENOUS | Status: DC

## 2014-12-29 NOTE — Progress Notes (Signed)
Initial Nutrition Assessment   INTERVENTION:   Meals and Snacks: Cater to patient preferences on renal diet order with fluid restriction Education: RD provided "Low Sodium Nutrition Therapy" handout from the Academy of [employer]etics. Reviewed patient's dietary recall. Provided examples on ways to decrease sodium intake in diet. Discouraged intake of processed foods and meats, and use of salt shaker. Encouraged fresh fruits and vegetables as well as whole grain sources of carbohydrates to maximize fiber intake.  RD discussed why it is important for patient to adhere to diet recommendations, and emphasized the role of fluids, foods to avoid, and importance of weighing self daily. Teach back method used. Expect good compliance.   NUTRITION DIAGNOSIS:   Food and nutrition related knowledge deficit related to chronic illness as evidenced by per patient/family report.  GOAL:   Patient will meet greater than or equal to 90% of their needs  MONITOR:    (Energy Intake, Electrolyte and renal Profile, Anthropometrics, Pulmonary Profile, Anemia Profile)  REASON FOR ASSESSMENT:   Diagnosis    ASSESSMENT:   Pt admitted with SOB secondary to CHF and volume overload. Pt recent admission for the same last week. Pt with h/o ESRD on HD, emergent HD yesterday 3L removed. Pt on bipap overnight, [place] this am on visit.  Past Medical History  Diagnosis Date  . Osteoarthritis   . Depression   . Frequent headaches   . HTN (hypertension)   . HLD (hyperlipidemia)   . History of colon polyps 2013    colonoscopy ([name])  . 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 Systolic murmur     mild  . GERD (gastroesophageal reflux disease)   . COPD (chronic obstructive pulmonary disease)   . ESRD (end stage renal disease) 08/2011    TThSa, L forearm AV fistula, [name]  . Secondary hyperparathyroidism of renal  origin   . Osteopenia 01/2013  . Bronchiectasis 08/2014     suggested by thoracic xray  . Anemia   . Nonrheumatic aortic valve stenosis     unspecified  . DVT (deep venous thrombosis)     Chronic    Diet Order:  Diet renal with fluid restriction Fluid restriction:: 1200 mL Fluid; Room service appropriate?: Yes; Fluid consistency:: Thin    Current Nutrition: Pt ate 100% of breakfast this am on visit including drinks.  Food/Nutrition-Related History: Pt reports appetite is good PTA. Pt reports eating frozen vegetables usually but does eat a lot of high sodium cheeses and high sodium meats regularly. Predict pt not watching fluid intake as pt made reference to cup MD had given her to measure fluids that she reports not using PTA.   Medications: Phoslo, Sensipar, vitamin B12, colace, MVI  Electrolyte/Renal Profile and Glucose Profile:   Recent Labs Lab 12/28/14 1321 12/29/14 0425  NA 142 144  K 3.9 3.9  CL 99* 100*  CO2 33* 35*  BUN 25* 17  CREATININE 4.95* 3.19*  CALCIUM 9.2 9.1  GLUCOSE 107* 140*   Protein Profile: No results for input(s): ALBUMIN in the last 168 hours.  Gastrointestinal Profile: Last BM:  12/27/2014 Output: 3L removed with HD yesterday  Nutrition-Focused Physical Exam Findings: Nutrition-Focused physical exam completed. Findings are WDL for fat depletion, muscle depletion, and edema.    Weight Change: Pt reports weight recently of 217lbs however does not know her usual dry weight   Skin:  Reviewed, no issues  Last BM:  12/27/2014  Height:   Ht Readings from Last 1 Encounters:  12/29/14 5\' 4"  (1.626 m)    Weight:   Wt Readings from Last 1 Encounters:  12/28/14 207 lb 0.2 oz (93.9 kg)    Wt Readings from Last 10 Encounters:  12/28/14 207 lb 0.2 oz (93.9 kg)  12/22/14 214 lb 11.7 oz (97.4 kg)  12/14/14 221 lb 1.9 oz (100.3 kg)  11/22/14 217 lb 3.2 oz (98.521 kg)  11/01/14 217 lb 4 oz (98.544 kg)  08/17/14 208 lb 8 oz (94.575 kg)  03/30/14  209 lb (94.802 kg)  12/04/13 200 lb 12 oz (91.06 kg)  08/02/13 214 lb (97.07 kg)  01/27/13 219 lb 4 oz (99.451 kg)    Ideal Body Weight:   54.5kg  BMI:  Body mass index is 35.52 kg/(m^2).  Estimated Nutritional Needs:   Kcal:  BEE: 1080kcals, TEE: (IF 1.2-1.4)(AF 1.3) 9924-2683, using IBW of 54.5kg  Protein:  65-82g protein (1.2-1.5g/kg) using IBW of 54.5kg  Fluid:  UOP+1040mL  EDUCATION NEEDS:   Education needs addressed   MODERATE Care Level  Leda Quail, RD, LDN Pager [phone]

## 2014-12-29 NOTE — Progress Notes (Signed)
[facility]  ROUNDING NOTE   Subjective:   Hemodialysis yesterday. Tolerated treatment well. UF of 3 litres.   Objective:  Vital signs in last 24 hours:  Temp:  [97.6 F (36.4 C)-98.6 F (37 C)] 98.3 F (36.8 C) (09/10 0800) Pulse Rate:  [95-117] 100 (09/10 0900) Resp:  [13-28] 19 (09/10 0900) BP: (104-167)/(54-94) 145/94 mmHg (09/10 0900) SpO2:  [80 %-100 %] 98 % (09/10 0900) FiO2 (%):  [35 %-40 %] 40 % (09/10 0000) Weight:  [93.9 kg (207 lb 0.2 oz)-97.3 kg (214 lb 8.1 oz)] 93.9 kg (207 lb 0.2 oz) (09/09 2000)  Weight change:  Filed Weights   12/28/14 1630 12/28/14 2000  Weight: 97.3 kg (214 lb 8.1 oz) 93.9 kg (207 lb 0.2 oz)    Intake/Output: I/O last 3 completed shifts: In: -  Out: 3000 [Other:3000]   Intake/Output this shift:     Physical Exam: General: Critically ill  Head: Normocephalic, atraumatic. +BiPap  Eyes: Anicteric, PERRL  Neck: Supple, trachea midline  Lungs:  Bilateral crackles  Heart: Tachycardia, +systolic murmur  Abdomen:  Soft, nontender, obese  Extremities: 1+ peripheral edema.  Neurologic: Nonfocal, moving all four extremities  Skin: No lesions  Access: AVF left arm    Basic Metabolic Panel:  Recent Labs Lab 12/28/14 1321 12/29/14 0425  NA 142 144  K 3.9 3.9  CL 99* 100*  CO2 33* 35*  GLUCOSE 107* 140*  BUN 25* 17  CREATININE 4.95* 3.19*  CALCIUM 9.2 9.1    Liver Function Tests: No results for input(s): AST, ALT, ALKPHOS, BILITOT, PROT, ALBUMIN in the last 168 hours. No results for input(s): LIPASE, AMYLASE in the last 168 hours. No results for input(s): AMMONIA in the last 168 hours.  CBC:  Recent Labs Lab 12/28/14 1321 12/29/14 0425  WBC 6.6 5.8  HGB 7.6* 6.8*  HCT 23.5* 20.8*  MCV 102.6* 102.9*  PLT 196 164    Cardiac Enzymes:  Recent Labs Lab 12/28/14 1321  TROPONINI 0.07*    BNP: Invalid input(s): POCBNP  CBG: No results for input(s): GLUCAP in the last 168  hours.  Microbiology: Results for orders placed or performed during the hospital encounter of 12/20/14  MRSA PCR Screening     Status: None   Collection Time: 12/21/14  2:13 AM  Result Value Ref Range Status   MRSA by PCR NEGATIVE NEGATIVE Final    Comment:        The GeneXpert MRSA Assay (FDA approved for NASAL specimens only), is one component of a comprehensive MRSA colonization surveillance program. It is not intended to diagnose MRSA infection nor to guide or monitor treatment for MRSA infections.     Coagulation Studies:  Recent Labs  12/28/14 1321  LABPROT 14.1  INR 1.07    Urinalysis: No results for input(s): COLORURINE, LABSPEC, PHURINE, GLUCOSEU, HGBUR, BILIRUBINUR, KETONESUR, PROTEINUR, UROBILINOGEN, NITRITE, LEUKOCYTESUR in the last 72 hours.  Invalid input(s): APPERANCEUR    Imaging: Dg Chest Port 1 View  12/28/2014   CLINICAL DATA:  Shortness of breath.  EXAM: PORTABLE CHEST - 1 VIEW  COMPARISON:  December 21, 2014.  FINDINGS: Stable cardiomediastinal silhouette is noted. No pneumothorax is noted. Slightly increased right infrahilar and basilar opacity is noted concerning for worsening pneumonia or edema. Stable left basilar opacity is noted concerning for edema or pneumonia. Mild central pulmonary vascular congestion is again noted and unchanged. No significant pleural effusion is noted.  IMPRESSION: Stable left basilar opacity is noted concerning for pneumonia or  edema. Increased right infrahilar and basilar opacity is noted concerning for worsening pneumonia or edema. Stable mild central pulmonary vascular congestion is noted.   Electronically Signed   By: Lupita Raider, M.D.   On: 12/28/2014 13:52     Medications:     . sodium chloride   Intravenous Once  . antiseptic oral rinse  7 mL Mouth Rinse QID  . budesonide-formoterol  2 puff Inhalation BID  . calcium acetate  2,001 mg Oral TID WC  . chlorhexidine gluconate  15 mL Mouth Rinse BID  .  cinacalcet  60 mg Oral Q breakfast  . citalopram  20 mg Oral Daily  . cyanocobalamin  1,000 mcg Intramuscular Q30 days  . docusate sodium  100 mg Oral BID  . donepezil  5 mg Oral QHS  . heparin  5,000 Units Subcutaneous 3 times per day  . multivitamin with minerals  1 tablet Oral Daily  . pravastatin  20 mg Oral q1800  . traZODone  25-50 mg Oral QHS   acetaminophen, albuterol, ALPRAZolam, baclofen, benzonatate, chlorpheniramine-HYDROcodone, guaifenesin, hydrOXYzine, lidocaine-prilocaine, ondansetron **OR** ondansetron (ZOFRAN) IV, pantoprazole, promethazine, zolpidem  Assessment/ Plan:  [name] is a 69 y.o. white female with ESRD, SHPTH, AOCD, HTN, hyperlipidemia, COPD, DVT with PE, history of tx with long term coumadin and IVC filter placement, L knee TKA, R shoulder surgery, IVC filter removed Aug 2014, pnemonia 8/16, admitted with shortness of breath 9/16.  TTS [facility] [address]   1. ESRD on HD TTHS: Status post emergent hemodialysis yesterday. Patient scheduled for dialysis again later today. Agree with cardiology about continuing fluid restriction. Currently, patient's outpatient estimated dry weight is 98.5. However seems to be leaving treatment 99 kg or above consistently. Will need to most likely aim to get patient to 98.5 or below and possibly increase her overall treatment time.  - next HD treatment for later today.  - Low salt diet - fluid restriction - Evaluate daily for dialysis need.   2. Hypotension: well controlled. Midodrine before treatment.  - IV albumin before treatment.   3. Anemia of chronic kidney disease. Hemoglobin 6.8, macrocytic - 1 unit PRBC ordered with treatment.  - Continue epo with HD. TTS  4. Secondary hyperparathyroidism. Patient will continue on cinacalcet. She also gets hectorol on outpatient HD.  - discontinued calcitriol - on calcium acetate with meals.    LOS: 1 Maleny Candy 9/10/201610:29 AM

## 2014-12-29 NOTE — Progress Notes (Signed)
Patient ID: Ariel Smith, female   DOB: [birth year], 69 y.o.   MRN: [id number]   SUBJECTIVE: Bipap just removed, 92% on [place] so far.  Not dyspneic at rest.  Had HD yesterday with volume removal.  Hemoglobin 6.8 today but no overt bleeding.  Scheduled Meds: . sodium chloride   Intravenous Once  . antiseptic oral rinse  7 mL Mouth Rinse QID  . budesonide-formoterol  2 puff Inhalation BID  . calcium acetate  2,001 mg Oral TID WC  . chlorhexidine gluconate  15 mL Mouth Rinse BID  . cinacalcet  60 mg Oral Q breakfast  . citalopram  20 mg Oral Daily  . cyanocobalamin  1,000 mcg Intramuscular Q30 days  . docusate sodium  100 mg Oral BID  . donepezil  5 mg Oral QHS  . heparin  5,000 Units Subcutaneous 3 times per day  . Influenza vac split quadrivalent PF  0.5 mL Intramuscular Tomorrow-1000  . multivitamin with minerals  1 tablet Oral Daily  . pravastatin  20 mg Oral q1800  . traZODone  25-50 mg Oral QHS   Continuous Infusions:  PRN Meds:.acetaminophen, albuterol, ALPRAZolam, baclofen, benzonatate, chlorpheniramine-HYDROcodone, guaifenesin, hydrOXYzine, lidocaine-prilocaine, ondansetron **OR** ondansetron (ZOFRAN) IV, pantoprazole, promethazine, zolpidem  Filed Vitals:   12/29/14 0500 12/29/14 0600 12/29/14 0700 12/29/14 0800  BP: 153/83 160/89 160/87 161/87  Pulse: 99 101 102 99  Temp: 98.1 F (36.7 C)   98.3 F (36.8 C)  TempSrc: Axillary   Axillary  Resp: 25 20 23 20   Height:  5\' 4"  (1.626 m)    Weight:      SpO2: 92% 97% 97% 97%    Intake/Output Summary (Last 24 hours) at 12/29/14 0903 Last data filed at 12/28/14 2000  Gross per 24 hour  Intake      0 ml  Output   3000 ml  Net  -3000 ml    LABS: Basic Metabolic Panel:  Recent Labs  26/83/41 1321 12/29/14 0425  NA 142 144  K 3.9 3.9  CL 99* 100*  CO2 33* 35*  GLUCOSE 107* 140*  BUN 25* 17  CREATININE 4.95* 3.19*  CALCIUM 9.2 9.1   Liver Function Tests: No results for input(s): AST, ALT, ALKPHOS, BILITOT, PROT,  ALBUMIN in the last 72 hours. No results for input(s): LIPASE, AMYLASE in the last 72 hours. CBC:  Recent Labs  12/28/14 1321 12/29/14 0425  WBC 6.6 5.8  HGB 7.6* 6.8*  HCT 23.5* 20.8*  MCV 102.6* 102.9*  PLT 196 164   Cardiac Enzymes:  Recent Labs  12/28/14 1321  TROPONINI 0.07*   BNP: Invalid input(s): POCBNP D-Dimer: No results for input(s): DDIMER in the last 72 hours. Hemoglobin A1C: No results for input(s): HGBA1C in the last 72 hours. Fasting Lipid Panel: No results for input(s): CHOL, HDL, LDLCALC, TRIG, CHOLHDL, LDLDIRECT in the last 72 hours. Thyroid Function Tests: No results for input(s): TSH, T4TOTAL, T3FREE, THYROIDAB in the last 72 hours.  Invalid input(s): FREET3 Anemia Panel: No results for input(s): VITAMINB12, FOLATE, FERRITIN, TIBC, IRON, RETICCTPCT in the last 72 hours.  RADIOLOGY: Dg Chest 2 View  12/20/2014   CLINICAL DATA:  End-stage renal disease.  Difficulty breathing.  EXAM: CHEST  2 VIEW  COMPARISON:  PA and lateral chest 12/14/2014. Single view of the chest 11/12/2014 and CT chest 11/12/2014.  FINDINGS: There is cardiomegaly and vascular congestion. Small left pleural effusion is noted. Streaky left basilar airspace disease is identified.  IMPRESSION: Small left pleural effusion streaky left  basilar airspace disease which could be atelectasis or pneumonia.  Cardiomegaly and vascular congestion.   Electronically Signed   By: Drusilla Kanner M.D.   On: 12/20/2014 18:06   Dg Chest 2 View  12/14/2014   CLINICAL DATA:  Shortness of Breath  EXAM: CHEST - 2 VIEW  COMPARISON:  12/11/2014  FINDINGS: Cardiac shadow is stable. The lungs are well aerated bilaterally. The previously seen right basilar infiltrate has shown some mild improvement when compare with the prior exam. No new focal infiltrate is seen.  IMPRESSION: Slight improved aeration in the right lung base.   Electronically Signed   By: Alcide Clever M.D.   On: 12/14/2014 08:00   Dg Chest Port 1  View  12/28/2014   CLINICAL DATA:  Shortness of breath.  EXAM: PORTABLE CHEST - 1 VIEW  COMPARISON:  December 21, 2014.  FINDINGS: Stable cardiomediastinal silhouette is noted. No pneumothorax is noted. Slightly increased right infrahilar and basilar opacity is noted concerning for worsening pneumonia or edema. Stable left basilar opacity is noted concerning for edema or pneumonia. Mild central pulmonary vascular congestion is again noted and unchanged. No significant pleural effusion is noted.  IMPRESSION: Stable left basilar opacity is noted concerning for pneumonia or edema. Increased right infrahilar and basilar opacity is noted concerning for worsening pneumonia or edema. Stable mild central pulmonary vascular congestion is noted.   Electronically Signed   By: Lupita Raider, M.D.   On: 12/28/2014 13:52   Portable Chest 1 View  12/21/2014   CLINICAL DATA:  Respiratory failure, followup  EXAM: PORTABLE CHEST - 1 VIEW  COMPARISON:  Chest x-ray of 12/20/2014  FINDINGS: The lungs remain poorly aerated with indistinct perihilar vasculature suggesting mild pulmonary vascular congestion. No definite pleural effusion is seen. Cardiomegaly is stable.  IMPRESSION: Cardiomegaly and probable worsening of pulmonary vascular congestion.   Electronically Signed   By: Dwyane Dee M.D.   On: 12/21/2014 07:59   Dg Chest Portable 1 View  12/11/2014   CLINICAL DATA:  Shortness of breath with decreased oxygen saturation.  EXAM: PORTABLE CHEST - 1 VIEW  COMPARISON:  11/18/2014  FINDINGS: Cardiac enlargement without vascular congestion. Increasing infiltration in the right mid and lower lung suggesting pneumonia. No blunting of costophrenic angles. No pneumothorax.  IMPRESSION: Increased right lung infiltrates suggesting pneumonia. Cardiac enlargement.   Electronically Signed   By: Burman Nieves M.D.   On: 12/11/2014 02:06    PHYSICAL EXAM General: NAD Neck: Thick, JVP difficult, no thyromegaly or thyroid nodule.    Lungs: Clear to auscultation bilaterally with normal respiratory effort. CV: Nondisplaced PMI.  Heart regular S1/S2, no S3/S4, 3/6 systolic murmur RUSB with obscured S2.  Trace ankle edema.   Abdomen: Soft, nontender, no hepatosplenomegaly, no distention.  Neurologic: Alert and oriented x 3.  Psych: Normal affect. Extremities: No clubbing or cyanosis.   TELEMETRY: Reviewed telemetry pt in NSR 90s-100s  ASSESSMENT AND PLAN: 69 yo with ESRD, COPD, h/o recent GI bleed, h/o PE/DVT, probably severe AS presented with acute/chronic diastolic CHF and anemia.  1. Acute/chronic diastolic CHF: In setting of suspected severe AS.  She had fluid off with HD yesterday and seems to be doing better, now off Bipap.  Still has volume overload.  - Needs HD today, planned for this morning.  2. Aortic stenosis: Suspect severe by echo in 7/16.  Exam consistent with severe AS.  Not SAVR candidate.  Down the road, may be TAVR candidate but needs to get through the  acute CHF exacerbation and there is also concern for ongoing GI bleeding.  3. Anemia: Hemoglobin lower today.  Recent GI bleed, off warfarin now.  No overt bleeding.  She will need transfusion today, can do during hemodialysis.  4. H/o PE/DVT: Has IVC filter, now off warfarin with GI bleeding.  5. COPD: Suspect this is not a COPD exacerbation.   Marca Ancona 12/29/2014 9:08 AM

## 2014-12-29 NOTE — Progress Notes (Signed)
[facility] [place] [facility]   PATIENT NAME: Ariel Smith    MR#:  [id number]  DATE OF BIRTH:  [birth year]  SUBJECTIVE:  CHIEF COMPLAINT:   Chief Complaint  Patient presents with  . Shortness of Breath   patient here due to shortness of breath from volume overload and CHF.  Patient was taken off BiPAP earlier but had a coughing spell and was placed back on it. We'll attempt to wean off BiPAP again today. Patient had hemodialysis yesterday and had 3 L of fluid removed. Patient to have it HD again today  REVIEW OF SYSTEMS:    Review of Systems  Constitutional: Negative for fever and chills.  HENT: Negative for congestion and tinnitus.   Eyes: Negative for blurred vision and double vision.  Respiratory: Positive for cough and shortness of breath. Negative for wheezing.   Cardiovascular: Negative for chest pain, orthopnea and PND.  Gastrointestinal: Negative for nausea, vomiting, abdominal pain and diarrhea.  Genitourinary: Negative for dysuria and hematuria.  Neurological: Positive for weakness (generalized). Negative for dizziness, sensory change and focal weakness.  All other systems reviewed and are negative.   Nutrition: Renal Tolerating Diet: Yes   DRUG ALLERGIES:   Allergies  Allergen Reactions  . Aspirin Other (See Comments)    Pt is unable to take because she has kidney disease.     Gaspar Skeeters [Isometheptene-Dichloral-Apap] Other (See Comments)    Reaction:  Headaches     VITALS:  Blood pressure 133/75, pulse 95, temperature 98.3 F (36.8 C), temperature source Axillary, resp. rate 14, height 5\' 4"  (1.626 m), weight 93.9 kg (207 lb 0.2 oz), SpO2 100 %.  PHYSICAL EXAMINATION:   Physical Exam  GENERAL:  69 y.o.-year-old patient lying in the bed on BiPAP and mild to distress.  EYES: Pupils equal, round, reactive to light and accommodation. No scleral icterus. Extraocular muscles intact.  HEENT: Head atraumatic, normocephalic.  Oropharynx and nasopharynx clear.  NECK:  Supple, no jugular venous distention. No thyroid enlargement, no tenderness.  LUNGS: Normal breath sounds bilaterally, no wheezing, rales, rhonchi. No use of accessory muscles of respiration.  CARDIOVASCULAR: S1, S2 normal. 2/6 systolic ejection murmur heard at the left sternal border, no rubs, or gallops.  ABDOMEN: Soft, nontender, nondistended. Bowel sounds present. No organomegaly or mass.  EXTREMITIES: No cyanosis, clubbing or edema b/l.    NEUROLOGIC: Cranial nerves II through XII are intact. No focal Motor or sensory deficits b/l. Globally weak   PSYCHIATRIC: The patient is alert and oriented x 3. Good affect SKIN: No obvious rash, lesion, or ulcer.   Left upper extremity AV fistula with good bruit and good thrill.  LABORATORY PANEL:   CBC  Recent Labs Lab 12/29/14 0425  WBC 5.8  HGB 6.8*  HCT 20.8*  PLT 164   ------------------------------------------------------------------------------------------------------------------  Chemistries   Recent Labs Lab 12/29/14 0425  NA 144  K 3.9  CL 100*  CO2 35*  GLUCOSE 140*  BUN 17  CREATININE 3.19*  CALCIUM 9.1   ------------------------------------------------------------------------------------------------------------------  Cardiac Enzymes  Recent Labs Lab 12/28/14 1321  TROPONINI 0.07*   ------------------------------------------------------------------------------------------------------------------  RADIOLOGY:  Dg Chest Port 1 View  12/28/2014   CLINICAL DATA:  Shortness of breath.  EXAM: PORTABLE CHEST - 1 VIEW  COMPARISON:  December 21, 2014.  FINDINGS: Stable cardiomediastinal silhouette is noted. No pneumothorax is noted. Slightly increased right infrahilar and basilar opacity is noted concerning for worsening pneumonia or edema. Stable left basilar opacity is noted concerning  for edema or pneumonia. Mild central pulmonary vascular congestion is again noted and  unchanged. No significant pleural effusion is noted.  IMPRESSION: Stable left basilar opacity is noted concerning for pneumonia or edema. Increased right infrahilar and basilar opacity is noted concerning for worsening pneumonia or edema. Stable mild central pulmonary vascular congestion is noted.   Electronically Signed   By: Lupita Raider, M.D.   On: 12/28/2014 13:52     ASSESSMENT AND PLAN:   69 year old female with past history of end-stage renal disease on hemodialysis, hypertension, severe aortic stenosis, GERD, history of previous DVT status post IVC filter, hyperlipidemia, anxiety who presents to the hospital due to shortness of breath and noted to be in acute respiratory failure with hypoxia.  #1 acute respiratory failure with hypoxia-this is likely secondary to volume overload and CHF. -Patient underwent emergent hemodialysis yesterday with 3 L fluid removed. Clinically feels a bit better and we'll attempt to wean off BiPAP today. Follow clinically.  #2 CHF/volume overload-acute on chronic diastolic dysfunction. The exact etiology of this is unclear. -Suspected to be secondary to possible severe aortic stenosis. Appreciate cardiology input and no evidence of acute intervention and continue supportive care with fluid removal. Patient may be a candidate for TAVR down the road but not presently. -Patient had HD yesterday and had 3 L removed and we'll get another HD treatment today  #3 severe aortic stenosis-patient likely not a good candidate for urgent surgical intervention given advanced age and comorbidities. -Continue supportive care for now. Seen by cardiology and no plan for aggressive intervention presently. Possibly plan for TaVR once she clinically improves from her CHF.  #4 end-stage renal disease on hemodialysis-patient had urgent HD yesterday at 3 L removed. -Continue dialysis on Tuesday Thursday Saturday and care as per nephrology.  #5 COPD-no acute exacerbation -continue  Symbicort, albuterol inhaler as needed.  #6 depression-continue Celexa.  #7 secondary hyperparathyroidism-continue PhosLo, Sensipar  #8 GERD-continue Protonix  #9 hyperlipidemia-continue Pravachol.  #10 anemia of chronic disease-this is likely secondary to her end-stage renal disease. Hemoglobin today is down to 6.8. Patient will be transfused 1 unit and will follow hemoglobin.   All the records are reviewed and case discussed with Care Management/Social Workerr. Management plans discussed with the patient, family and they are in agreement.  CODE STATUS: Full  DVT Prophylaxis: Teds and SCDs  TOTAL CRITICAL CARE TIME TAKING CARE OF THIS PATIENT: 35 minutes.   POSSIBLE D/C IN 2-3 DAYS, DEPENDING ON CLINICAL CONDITION.   Houston Siren M.D on 12/29/2014 at 1:55 PM  Between 7am to 6pm - Pager - [phone]  After 6pm go to [email or web address] - password [email or web address] [facility]  [facility]  Office  [phone]  CC: Primary care physician; [name], MD

## 2014-12-29 NOTE — Progress Notes (Signed)
Pt hemoglobin 6.8 on this morning's labwork, was previously 7.6.  Pt is asympotomatic, VSS.  Notified Dr. Sheryle Hail of hemoglobin, no new orders at this time per Dr. Sheryle Hail.

## 2014-12-29 NOTE — Progress Notes (Signed)
Pt alert and oriented, no complaints of pain.  Pt received urgent HD on 12/28/14, 3L fluid removed per dialysis nurse.  Plan to perform HD again today.  Pt weaned from bipap for about 1 hr after dialysis to 4L [place], but pt developed increased work of breathing and assessory muscle use.  Pt placed back on bipap, tolerating well and resting quietly.  VSS, afebrile.  Sinus tachcardia.

## 2014-12-29 NOTE — Progress Notes (Signed)
   12/29/14 0950  Clinical Encounter Type  Visited With Patient  Visit Type Follow-up  Referral From Chaplain  Consult/Referral To Chaplain  Spiritual Encounters  Spiritual Needs Prayer;Emotional  Stress Factors  Patient Stress Factors Exhausted;Health changes  Met w/patient to provide pastoral care,  support, & prayer. Chap. Rosaleen Mazer G. Benton

## 2014-12-30 LAB — CBC
HCT: 25.4 % — ABNORMAL LOW (ref 35.0–47.0)
HEMOGLOBIN: 8.3 g/dL — AB (ref 12.0–16.0)
MCH: 33.3 pg (ref 26.0–34.0)
MCHC: 32.6 g/dL (ref 32.0–36.0)
MCV: 102.2 fL — ABNORMAL HIGH (ref 80.0–100.0)
Platelets: 212 10*3/uL (ref 150–440)
RBC: 2.48 MIL/uL — ABNORMAL LOW (ref 3.80–5.20)
RDW: 17.9 % — ABNORMAL HIGH (ref 11.5–14.5)
WBC: 9.3 10*3/uL (ref 3.6–11.0)

## 2014-12-30 NOTE — Progress Notes (Signed)
Ariel Smith  ROUNDING NOTE   Subjective:   Husband at bedside.  Hemodialysis yesterday. Ultrafiltration of 3.7 litres.   Objective:  Vital signs in last 24 hours:  Temp:  [97.5 F (36.4 C)-98.8 F (37.1 C)] 97.5 F (36.4 C) (09/11 0700) Pulse Rate:  [87-106] 87 (09/11 0900) Resp:  [13-27] 18 (09/11 0900) BP: (124-168)/(66-102) 146/87 mmHg (09/11 0900) SpO2:  [91 %-100 %] 96 % (09/11 0900) Weight:  [97.3 kg (214 lb 8.1 oz)] 97.3 kg (214 lb 8.1 oz) (09/10 1500)  Weight change: 0 kg (0 lb) Filed Weights   12/28/14 1630 12/28/14 2000 12/29/14 1500  Weight: 97.3 kg (214 lb 8.1 oz) 93.9 kg (207 lb 0.2 oz) 97.3 kg (214 lb 8.1 oz)    Intake/Output: I/O last 3 completed shifts: In: 360 [Blood:360] Out: 6700 [Other:6700]   Intake/Output this shift:     Physical Exam: General: NAD  Head: Normocephalic, atraumatic.   Eyes: Anicteric, PERRL  Neck: Supple, trachea midline  Lungs:  Bilateral crackles, [place]   Heart: Regular rate, +systolic murmur  Abdomen:  Soft, nontender, obese  Extremities: no peripheral edema.  Neurologic: Nonfocal, moving all four extremities  Skin: No lesions  Access: AVF left arm    Basic Metabolic Panel:  Recent Labs Lab 12/28/14 1321 12/29/14 0425  NA 142 144  K 3.9 3.9  CL 99* 100*  CO2 33* 35*  GLUCOSE 107* 140*  BUN 25* 17  CREATININE 4.95* 3.19*  CALCIUM 9.2 9.1    Liver Function Tests: No results for input(s): AST, ALT, ALKPHOS, BILITOT, PROT, ALBUMIN in the last 168 hours. No results for input(s): LIPASE, AMYLASE in the last 168 hours. No results for input(s): AMMONIA in the last 168 hours.  CBC:  Recent Labs Lab 12/28/14 1321 12/29/14 0425 12/30/14 0342  WBC 6.6 5.8 9.3  HGB 7.6* 6.8* 8.3*  HCT 23.5* 20.8* 25.4*  MCV 102.6* 102.9* 102.2*  PLT 196 164 212    Cardiac Enzymes:  Recent Labs Lab 12/28/14 1321  TROPONINI 0.07*    BNP: Invalid input(s): POCBNP  CBG: No results for input(s): GLUCAP in  the last 168 hours.  Microbiology: Results for orders placed or performed during the hospital encounter of 12/20/14  MRSA PCR Screening     Status: None   Collection Time: 12/21/14  2:13 AM  Result Value Ref Range Status   MRSA by PCR NEGATIVE NEGATIVE Final    Comment:        The GeneXpert MRSA Assay (FDA approved for NASAL specimens only), is one component of a comprehensive MRSA colonization surveillance program. It is not intended to diagnose MRSA infection nor to guide or monitor treatment for MRSA infections.     Coagulation Studies:  Recent Labs  12/28/14 1321  LABPROT 14.1  INR 1.07    Urinalysis: No results for input(s): COLORURINE, LABSPEC, PHURINE, GLUCOSEU, HGBUR, BILIRUBINUR, KETONESUR, PROTEINUR, UROBILINOGEN, NITRITE, LEUKOCYTESUR in the last 72 hours.  Invalid input(s): APPERANCEUR    Imaging: Dg Chest Port 1 View  12/28/2014   CLINICAL DATA:  Shortness of breath.  EXAM: PORTABLE CHEST - 1 VIEW  COMPARISON:  December 21, 2014.  FINDINGS: Stable cardiomediastinal silhouette is noted. No pneumothorax is noted. Slightly increased right infrahilar and basilar opacity is noted concerning for worsening pneumonia or edema. Stable left basilar opacity is noted concerning for edema or pneumonia. Mild [place] pulmonary vascular congestion is again noted and unchanged. No significant pleural effusion is noted.  IMPRESSION: Stable left basilar opacity  is noted concerning for pneumonia or edema. Increased right infrahilar and basilar opacity is noted concerning for worsening pneumonia or edema. Stable mild Ari[place] pulmonary vascular congestion is noted.   Electronically Signed   By: Lupita Raider, M.D.   On: 12/28/2014 13:52     Medications:     . sodium chloride   Intravenous Once  . antiseptic oral rinse  7 mL Mouth Rinse QID  . budesonide-formoterol  2 puff Inhalation BID  . calcium acetate  2,001 mg Oral TID WC  . chlorhexidine gluconate  15 mL Mouth Rinse BID   . cinacalcet  60 mg Oral Q breakfast  . citalopram  20 mg Oral Daily  . cyanocobalamin  1,000 mcg Intramuscular Q30 days  . docusate sodium  100 mg Oral BID  . donepezil  5 mg Oral QHS  . heparin  5,000 Units Subcutaneous 3 times per day  . multivitamin with minerals  1 tablet Oral Daily  . pravastatin  20 mg Oral q1800  . traZODone  25-50 mg Oral QHS   acetaminophen, albuterol, ALPRAZolam, baclofen, benzonatate, chlorpheniramine-HYDROcodone, guaifenesin, hydrOXYzine, lidocaine-prilocaine, ondansetron **OR** ondansetron (ZOFRAN) IV, pantoprazole, promethazine, zolpidem  Assessment/ Plan:  [name] is a 69 y.o. white female with ESRD, SHPTH, AOCD, HTN, hyperlipidemia, COPD, DVT with PE, history of tx with long term coumadin and IVC filter placement, L knee TKA, R shoulder surgery, IVC filter removed Aug 2014, pnemonia 8/16, admitted with shortness of breath 9/16.  TTS [facility] [address]   1. ESRD on HD TTHS: Status post emergent hemodialysis on 9/9, and then hemodialysis again on 9/10. No acute indication for dialysis at this time.  Agree with cardiology about continuing fluid restriction. Currently, patient's outpatient estimated dry weight is 98.5. However seems to be leaving treatment 99 kg or above consistently. Will need to most likely aim to get patient to 98.5 or below and possibly increase her overall treatment time.  - Low salt diet - fluid restriction - Evaluate daily for dialysis need.   2. Hypotension: well controlled. Midodrine before treatment.  - IV albumin before treatment on 9/10  3. Anemia of chronic Smith disease. Hemoglobin 6.8, macrocytic - 1 unit PRBC 9/10 - Continue epo with HD on TTS days  4. Secondary hyperparathyroidism. Patient will continue on cinacalcet. She also gets hectorol on outpatient HD.  - discontinued calcitriol - on calcium acetate with meals.    LOS: 2 Ariel Smith 9/11/201610:47 AM

## 2014-12-30 NOTE — Progress Notes (Signed)
[facility] [place] at [facility]   PATIENT NAME: Ariel Smith    MR#:  [id number]  DATE OF BIRTH:  01-16-1946  SUBJECTIVE:  CHIEF COMPLAINT:   Chief Complaint  Patient presents with  . Shortness of Breath   patient here due to shortness of breath from volume overload and CHF. Patient has had hemodialysis the past 2 days and had 6 L taken off and is clinically feeling better. Currently on 4-5 L nasal cannula doing well. Requiring intermittent BiPAP at bedtime REVIEW OF SYSTEMS:    Review of Systems  Constitutional: Negative for fever and chills.  HENT: Negative for congestion and tinnitus.   Eyes: Negative for blurred vision and double vision.  Respiratory: Positive for cough and shortness of breath. Negative for wheezing.   Cardiovascular: Negative for chest pain, orthopnea and PND.  Gastrointestinal: Negative for nausea, vomiting, abdominal pain and diarrhea.  Genitourinary: Negative for dysuria and hematuria.  Neurological: Positive for weakness (generalized). Negative for dizziness, sensory change and focal weakness.  All other systems reviewed and are negative.   Nutrition: Renal Tolerating Diet: Yes   DRUG ALLERGIES:   Allergies  Allergen Reactions  . Aspirin Other (See Comments)    Pt is unable to take because she has kidney disease.     Gaspar Skeeters [Isometheptene-Dichloral-Apap] Other (See Comments)    Reaction:  Headaches     VITALS:  Blood pressure 146/87, pulse 87, temperature 97.5 F (36.4 C), temperature source Axillary, resp. rate 18, height  (1.626 m), weight 97.3 kg (214 lb 8.1 oz), SpO2 96 %.  PHYSICAL EXAMINATION:   Physical Exam  GENERAL:  69 y.o.-year-old patient lying in the bed in no acute distress.  EYES: Pupils equal, round, reactive to light and accommodation. No scleral icterus. Extraocular muscles intact.  HEENT: Head atraumatic, normocephalic. Oropharynx and nasopharynx clear.  NECK:  Supple, no jugular  venous distention. No thyroid enlargement, no tenderness.  LUNGS: Normal breath sounds bilaterally, no wheezing, rales, rhonchi. No use of accessory muscles of respiration.  CARDIOVASCULAR: S1, S2 normal. 2/6 systolic ejection murmur heard at the left sternal border, no rubs, or gallops.  ABDOMEN: Soft, nontender, nondistended. Bowel sounds present. No organomegaly or mass.  EXTREMITIES: No cyanosis, clubbing or edema b/l.    NEUROLOGIC: Cranial nerves II through XII are intact. No focal Motor or sensory deficits b/l. Globally weak   PSYCHIATRIC: The patient is alert and oriented x 3. Good affect SKIN: No obvious rash, lesion, or ulcer.   Left upper extremity AV fistula with good bruit and good thrill.  LABORATORY PANEL:   CBC  Recent Labs Lab 12/30/14 0342  WBC 9.3  HGB 8.3*  HCT 25.4*  PLT 212   ------------------------------------------------------------------------------------------------------------------  Chemistries   Recent Labs Lab 12/29/14 0425  NA 144  K 3.9  CL 100*  CO2 35*  GLUCOSE 140*  BUN 17  CREATININE 3.19*  CALCIUM 9.1   ------------------------------------------------------------------------------------------------------------------  Cardiac Enzymes  Recent Labs Lab 12/28/14 1321  TROPONINI 0.07*   ------------------------------------------------------------------------------------------------------------------  RADIOLOGY:  Dg Chest Port 1 View  12/28/2014   CLINICAL DATA:  Shortness of breath.  EXAM: PORTABLE CHEST - 1 VIEW  COMPARISON:  December 21, 2014.  FINDINGS: Stable cardiomediastinal silhouette is noted. No pneumothorax is noted. Slightly increased right infrahilar and basilar opacity is noted concerning for worsening pneumonia or edema. Stable left basilar opacity is noted concerning for edema or pneumonia. Mild central pulmonary vascular congestion is again noted and unchanged. No  significant pleural effusion is noted.  IMPRESSION:  Stable left basilar opacity is noted concerning for pneumonia or edema. Increased right infrahilar and basilar opacity is noted concerning for worsening pneumonia or edema. Stable mild central pulmonary vascular congestion is noted.   Electronically Signed   By: Lupita Raider, M.D.   On: 12/28/2014 13:52     ASSESSMENT AND PLAN:   69 year old female with past history of end-stage renal disease on hemodialysis, hypertension, severe aortic stenosis, GERD, history of previous DVT status post IVC filter, hyperlipidemia, anxiety who presents to the hospital due to shortness of breath and noted to be in acute respiratory failure with hypoxia.  #1 acute respiratory failure with hypoxia-this is likely secondary to volume overload and CHF. -Patient underwent hemodialysis 2 days in a row with 6 L fluid removed. Clinically feels a bit better and off BiPAP.  #2 CHF/volume overload-acute on chronic diastolic dysfunction. The exact etiology of this is unclear. -Suspected to be secondary to possible severe aortic stenosis. Appreciate cardiology input and no evidence of acute intervention and continue supportive care with fluid removal. Patient may be a candidate for TAVR down the road but not presently. -Patient had HD past 2 days and had 6 L removed and feels much better and is off BiPAP.   #3 severe aortic stenosis-patient likely not a good candidate for urgent surgical intervention given advanced age and comorbidities. -Continue supportive care for now. Seen by cardiology and no plan for aggressive intervention presently. Possibly plan for TaVR once she clinically improves from her CHF.  #4 end-stage renal disease on hemodialysis -Continue dialysis on Tuesday Thursday Saturday and care as per nephrology.  #5 COPD-no acute exacerbation -continue Symbicort, albuterol inhaler as needed.  #6 depression-continue Celexa.  #7 secondary hyperparathyroidism-continue PhosLo, Sensipar  #8 GERD-continue  Protonix  #9 hyperlipidemia-continue Pravachol.  #10 anemia of chronic disease-this is likely secondary to her end-stage renal disease. Hemoglobin today is down to 8.3 posttransfusion and will monitor  Transfer to floor today  All the records are reviewed and case discussed with Care Management/Social Workerr. Management plans discussed with the patient, family and they are in agreement.  CODE STATUS: Full  DVT Prophylaxis: Teds and SCDs  TOTAL TIME TAKING CARE OF THIS PATIENT: 25 minutes.   POSSIBLE D/C IN 2-3 DAYS, DEPENDING ON CLINICAL CONDITION.   Houston Siren M.D on 12/30/2014 at 1:24 PM  Between 7am to 6pm - Pager - [phone]  After 6pm go to [email or web address] - password [email or web address]  [facility] [place] [facility]  Office  [phone]  CC: Primary care physician; [name], MD

## 2014-12-31 ENCOUNTER — Encounter: Payer: Medicare Other | Admitting: Internal Medicine

## 2014-12-31 LAB — TYPE AND SCREEN
ABO/RH(D): O POS
Antibody Screen: NEGATIVE
Unit division: 0

## 2014-12-31 NOTE — Discharge Summary (Signed)
[facility] - [place] [facility]   PATIENT NAME: Ariel Smith    MR#:  [id number]  DATE OF BIRTH:  1945/10/27  DATE OF ADMISSION:  12/28/2014 ADMITTING PHYSICIAN: Houston Siren, MD  DATE OF DISCHARGE: 12/31/2014 11:45 AM  PRIMARY CARE PHYSICIAN: [name], MD    ADMISSION DIAGNOSIS:  Acute pulmonary edema [J81.0] Hypoxia [R09.02]  DISCHARGE DIAGNOSIS:  Active Problems:   CHF (congestive heart failure)   Acute pulmonary edema   Hypoxia   Acute on chronic diastolic CHF (congestive heart failure)   ESRD on dialysis   Severe aortic valve stenosis   SECONDARY DIAGNOSIS:   Past Medical History  Diagnosis Date  . Osteoarthritis   . Depression   . Frequent headaches   . HTN (hypertension)   . HLD (hyperlipidemia)   . History of colon polyps 2013    colonoscopy ([name])  . 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 Systolic murmur     mild  . GERD (gastroesophageal reflux disease)   . COPD (chronic obstructive pulmonary disease)   . ESRD (end stage renal disease) 08/2011    TThSa, L forearm AV fistula, [name]  . Secondary hyperparathyroidism of renal origin   . Osteopenia 01/2013  . Bronchiectasis 08/2014     suggested by thoracic xray  . Anemia   . Nonrheumatic aortic valve stenosis     unspecified  . DVT (deep venous thrombosis)     Chronic    HOSPITAL COURSE:   69 year old female with past history of end-stage renal disease on hemodialysis, hypertension, severe aortic stenosis, GERD, history of previous DVT status post IVC filter, hyperlipidemia, anxiety who presents to the hospital due to shortness of breath and noted to be in acute respiratory failure with hypoxia.  #1 acute respiratory failure with hypoxia-this is likely secondary to volume overload and CHF. -Patient underwent hemodialysis twice on the hospital and had 6 L fluid  removed.  Initially patient was on BiPAP and now weaned off and clinically doing well on 4-5 L and therefore being discharged home.  #2 CHF/volume overload-acute on chronic diastolic dysfunction.  -Suspected to be secondary to possible severe aortic stenosis. Seen by cardiology and no plans for acute intervention regarding her aortic stenosis given the fact that she is a high risk surgical candidate.. Patient may be a candidate for TAVR down the road but not presently. -Patient had HD  and had 6 L removed and feels much better and therefore being discharged home  #3 severe aortic stenosis-patient likely not a good candidate for urgent surgical intervention given advanced age and comorbidities. -Continue supportive care for now. Seen by cardiology and no plan for aggressive intervention presently. Possibly plan for TaVR as an outpatient.   #4 end-stage renal disease on hemodialysis -Continue dialysis on Tuesday Thursday Saturday.    #5 COPD-no acute exacerbation -continue Symbicort, albuterol inhaler as needed.  Patient already on oxygen at home  #6 depression-continue Celexa.  #7 secondary hyperparathyroidism-continue PhosLo, Sensipar  #8 GERD-continue Protonix  #9 hyperlipidemia-continue Pravachol.  #10 anemia of chronic disease-this is likely secondary to her end-stage renal disease. She was transfused 1 unit of packed blood cells on the hospital and hemoglobin improved and is remains stable. Hemoglobin on discharge is 8.3.      DISCHARGE CONDITIONS:   Stable  CONSULTS OBTAINED:     DRUG ALLERGIES:  Allergies  Allergen Reactions  . Aspirin Other (See Comments)    Pt is unable to take because she has kidney disease.     Gaspar Skeeters [Isometheptene-Dichloral-Apap] Other (See Comments)    Reaction:  Headaches     DISCHARGE MEDICATIONS:   Discharge Medication List as of 12/31/2014 11:26 AM    CONTINUE these medications which have NOT CHANGED   Details  acetaminophen  (TYLENOL) 500 MG tablet Take 1,000 mg by mouth every 6 (six) hours as needed for mild pain or headache. , Until Discontinued, Historical Med    albuterol (VENTOLIN HFA) 108 (90 BASE) MCG/ACT inhaler Inhale 2 puffs into the lungs every 4 (four) hours as needed for wheezing or shortness of breath. , Until Discontinued, Historical Med    ALPRAZolam (XANAX) 1 MG tablet Take 1 tablet (1 mg total) by mouth 3 (three) times daily., Starting 11/22/2014, Until Discontinued, Print    baclofen (LIORESAL) 10 MG tablet Take 5-10 mg by mouth 2 (two) times daily as needed for muscle spasms., Until Discontinued, Historical Med    benzonatate (TESSALON) 100 MG capsule Take 1 capsule (100 mg total) by mouth every 6 (six) hours as needed for cough., Starting 11/22/2014, Until Discontinued, Normal    budesonide-formoterol (SYMBICORT) 160-4.5 MCG/ACT inhaler Inhale 2 puffs into the lungs 2 (two) times daily., Until Discontinued, Historical Med    calcitRIOL (ROCALTROL) 0.25 MCG capsule Take 0.25 mcg by mouth daily., Until Discontinued, Historical Med    calcium acetate (PHOSLO) 667 MG capsule Take 2,001 mg by mouth 3 (three) times daily with meals. , Until Discontinued, Historical Med    chlorpheniramine-HYDROcodone (TUSSIONEX) 10-8 MG/5ML SUER Take 5 mLs by mouth every 12 (twelve) hours as needed for cough., Starting 11/22/2014, Until Discontinued, Print    cinacalcet (SENSIPAR) 60 MG tablet Take 60 mg by mouth daily., Until Discontinued, Historical Med    citalopram (CELEXA) 20 MG tablet Take 1 tablet (20 mg total) by mouth daily., Starting 03/30/2014, Until Discontinued, Normal    cyanocobalamin (,VITAMIN B-12,) 1000 MCG/ML injection Inject 1,000 mcg into the muscle every 30 (thirty) days., Until Discontinued, Historical Med    docusate sodium (COLACE) 100 MG capsule Take 1 capsule (100 mg total) by mouth 2 (two) times daily., Starting 11/22/2014, Until Discontinued, Normal    donepezil (ARICEPT) 5 MG tablet Take 1  tablet (5 mg total) by mouth at bedtime., Starting 11/01/2014, Until Discontinued, Normal    guaifenesin (ROBITUSSIN) 100 MG/5ML syrup Take 10 mLs (200 mg total) by mouth every 6 (six) hours as needed for cough or congestion., Starting 12/22/2014, Until Discontinued, Normal    hydrOXYzine (ATARAX/VISTARIL) 25 MG tablet Take 12.5-25 mg by mouth 2 (two) times daily as needed for anxiety., Until Discontinued, Historical Med    lidocaine-prilocaine (EMLA) cream Apply 1 application topically as needed (topical anesthesia for hemodialysis if Gebauers and Lidocaine injection are ineffective.)., Starting 11/22/2014, Until Discontinued, Normal    lovastatin (MEVACOR) 20 MG tablet Take 20 mg by mouth at bedtime., Until Discontinued, Historical Med    Multiple Vitamin (MULTIVITAMIN WITH MINERALS) TABS tablet Take 1 tablet by mouth daily., Starting 11/22/2014, Until Discontinued, Normal    pantoprazole (PROTONIX) 40 MG tablet Take 40 mg by mouth daily as needed (for heartburn/indigestion)., Until Discontinued, Historical Med    promethazine (PHENERGAN) 12.5 MG tablet Take 12.5 mg by mouth every 6 (six) hours as needed for nausea or vomiting., Until Discontinued, Historical Med    traZODone (DESYREL) 50 MG tablet Take 0.5-1 tablets (25-50 mg  total) by mouth at bedtime., Starting 11/01/2014, Until Discontinued, Normal    zolpidem (AMBIEN) 5 MG tablet Take 1 tablet (5 mg total) by mouth at bedtime as needed for sleep., Starting 11/22/2014, Until Discontinued, Print    Nutritional Supplements (FEEDING SUPPLEMENT, NEPRO CARB STEADY,) LIQD Take 237 mLs by mouth 2 (two) times daily between meals., Starting 11/22/2014, Until Discontinued, Normal         DISCHARGE INSTRUCTIONS:   DIET:  Cardiac diet and Renal diet  DISCHARGE CONDITION:  Stable  ACTIVITY:  Activity as tolerated  OXYGEN:  Home Oxygen: Yes.     Oxygen Delivery: 3 liters/min via Patient connected to nasal cannula oxygen  DISCHARGE LOCATION:   Home with home health nursing physical therapy and aid   If you experience worsening of your admission symptoms, develop shortness of breath, life threatening emergency, suicidal or homicidal thoughts you must seek medical attention immediately by calling 911 or calling your MD immediately  if symptoms less severe.  You Must read complete instructions/literature along with all the possible adverse reactions/side effects for all the Medicines you take and that have been prescribed to you. Take any new Medicines after you have completely understood and accpet all the possible adverse reactions/side effects.   Please note  You were cared for by a hospitalist during your hospital stay. If you have any questions about your discharge medications or the care you received while you were in the hospital after you are discharged, you can call the unit and asked to speak with the hospitalist on call if the hospitalist that took care of you is not available. Once you are discharged, your primary care physician will handle any further medical issues. Please note that NO REFILLS for any discharge medications will be authorized once you are discharged, as it is imperative that you return to your primary care physician (or establish a relationship with a primary care physician if you do not have one) for your aftercare needs so that they can reassess your need for medications and monitor your lab values.     Today   Feels much better and shortness of breath improved. No chest pain, nausea, vomiting.  VITAL SIGNS:  Blood pressure 116/77, pulse 90, temperature 97.7 F (36.5 C), temperature source Oral, resp. rate 16, height 5\' 4"  (1.626 m), weight 95.074 kg (209 lb 9.6 oz), SpO2 98 %.  I/O:   Intake/Output Summary (Last 24 hours) at 12/31/14 1535 Last data filed at 12/31/14 1014  Gross per 24 hour  Intake    240 ml  Output      0 ml  Net    240 ml    PHYSICAL EXAMINATION:   GENERAL: 69  y.o.-year-old patient lying in the bed in no acute distress.  EYES: Pupils equal, round, reactive to light and accommodation. No scleral icterus. Extraocular muscles intact.  HEENT: Head atraumatic, normocephalic. Oropharynx and nasopharynx clear.  NECK: Supple, no jugular venous distention. No thyroid enlargement, no tenderness.  LUNGS: Normal breath sounds bilaterally, no wheezing, rales, rhonchi. No use of accessory muscles of respiration.  CARDIOVASCULAR: S1, S2 normal. 2/6 systolic ejection murmur heard at the left sternal border, no rubs, or gallops.  ABDOMEN: Soft, nontender, nondistended. Bowel sounds present. No organomegaly or mass.  EXTREMITIES: No cyanosis, clubbing or edema b/l.  NEUROLOGIC: Cranial nerves II through XII are intact. No focal Motor or sensory deficits b/l. Globally weak  PSYCHIATRIC: The patient is alert and oriented x 3. Good affect SKIN: No obvious  rash, lesion, or ulcer.   Left upper extremity AV fistula with good bruit and good thrill. DATA REVIEW:   CBC  Recent Labs Lab 12/30/14 0342  WBC 9.3  HGB 8.3*  HCT 25.4*  PLT 212    Chemistries   Recent Labs Lab 12/29/14 0425  NA 144  K 3.9  CL 100*  CO2 35*  GLUCOSE 140*  BUN 17  CREATININE 3.19*  CALCIUM 9.1    Cardiac Enzymes  Recent Labs Lab 12/28/14 1321  TROPONINI 0.07*    Microbiology Results  Results for orders placed or performed during the hospital encounter of 12/20/14  MRSA PCR Screening     Status: None   Collection Time: 12/21/14  2:13 AM  Result Value Ref Range Status   MRSA by PCR NEGATIVE NEGATIVE Final    Comment:        The GeneXpert MRSA Assay (FDA approved for NASAL specimens only), is one component of a comprehensive MRSA colonization surveillance program. It is not intended to diagnose MRSA infection nor to guide or monitor treatment for MRSA infections.     RADIOLOGY:  No results found.    Management plans discussed with the  patient, family and they are in agreement.  CODE STATUS:   TOTAL TIME TAKING CARE OF THIS PATIENT: 40 minutes.    Houston Siren M.D on 12/31/2014 at 3:35 PM  Between 7am to 6pm - Pager - [phone]  After 6pm go to [email or web address] - password [email or web address] [facility]  [place]  Office  [phone]  CC: Primary care physician; [name], MD

## 2014-12-31 NOTE — Progress Notes (Signed)
A& O. Ambulated with PT around the nurses station with 5 L of oyxgen and tolerated it well. NSR. Pt reported no pain. Takes meds ok. Son at the bedside. IV and tele removed. Discharge instructions given to pt. Pt has no further concerns at this time.

## 2014-12-31 NOTE — Progress Notes (Signed)
Patient was made an initial appointment at the outpatient [facility] on January 17, 2015 at 9:00am. Thank you.

## 2014-12-31 NOTE — Progress Notes (Signed)
Subjective:   Doing well this morning Worked with OD No SOB No leg edema .   Objective:  Vital signs in last 24 hours:  Temp:  [97.4 F (36.3 C)-98 F (36.7 C)] 97.4 F (36.3 C) (09/12 0429) Pulse Rate:  [85-93] 85 (09/12 0429) Resp:  [18-25] 20 (09/12 0429) BP: (123-150)/(61-92) 131/91 mmHg (09/12 0429) SpO2:  [94 %-100 %] 100 % (09/12 0429) Weight:  [95.074 kg (209 lb 9.6 oz)] 95.074 kg (209 lb 9.6 oz) (09/11 1630)  Weight change: -2.226 kg (-4 lb 14.5 oz) Filed Weights   12/28/14 2000 12/29/14 1500 12/30/14 1630  Weight: 93.9 kg (207 lb 0.2 oz) 97.3 kg (214 lb 8.1 oz) 95.074 kg (209 lb 9.6 oz)    Intake/Output: I/O last 3 completed shifts: In: 400 [P.O.:400] Out: 0    Intake/Output this shift:     Physical Exam: General: NAD  Head: Normocephalic, atraumatic.   Eyes: Anicteric,  Neck: Supple, trachea midline  Lungs:  Bilateral crackles, [place]   Heart: Regular rate, +harsh systolic murmur  Abdomen:  Soft, nontender, obese  Extremities: no peripheral edema.  Neurologic: Nonfocal, moving all four extremities  Skin: No lesions  Access: AVF left arm    Basic Metabolic Panel:  Recent Labs Lab 12/28/14 1321 12/29/14 0425  NA 142 144  K 3.9 3.9  CL 99* 100*  CO2 33* 35*  GLUCOSE 107* 140*  BUN 25* 17  CREATININE 4.95* 3.19*  CALCIUM 9.2 9.1    Liver Function Tests: No results for input(s): AST, ALT, ALKPHOS, BILITOT, PROT, ALBUMIN in the last 168 hours. No results for input(s): LIPASE, AMYLASE in the last 168 hours. No results for input(s): AMMONIA in the last 168 hours.  CBC:  Recent Labs Lab 12/28/14 1321 12/29/14 0425 12/30/14 0342  WBC 6.6 5.8 9.3  HGB 7.6* 6.8* 8.3*  HCT 23.5* 20.8* 25.4*  MCV 102.6* 102.9* 102.2*  PLT 196 164 212    Cardiac Enzymes:  Recent Labs Lab 12/28/14 1321  TROPONINI 0.07*    BNP: Invalid input(s): POCBNP  CBG: No results for input(s): GLUCAP in the last 168 hours.  Microbiology: Results for  orders placed or performed during the hospital encounter of 12/20/14  MRSA PCR Screening     Status: None   Collection Time: 12/21/14  2:13 AM  Result Value Ref Range Status   MRSA by PCR NEGATIVE NEGATIVE Final    Comment:        The GeneXpert MRSA Assay (FDA approved for NASAL specimens only), is one component of a comprehensive MRSA colonization surveillance program. It is not intended to diagnose MRSA infection nor to guide or monitor treatment for MRSA infections.     Coagulation Studies:  Recent Labs  12/28/14 1321  LABPROT 14.1  INR 1.07    Urinalysis: No results for input(s): COLORURINE, LABSPEC, PHURINE, GLUCOSEU, HGBUR, BILIRUBINUR, KETONESUR, PROTEINUR, UROBILINOGEN, NITRITE, LEUKOCYTESUR in the last 72 hours.  Invalid input(s): APPERANCEUR    Imaging: No results found.   Medications:     . sodium chloride   Intravenous Once  . antiseptic oral rinse  7 mL Mouth Rinse QID  . budesonide-formoterol  2 puff Inhalation BID  . calcium acetate  2,001 mg Oral TID WC  . chlorhexidine gluconate  15 mL Mouth Rinse BID  . cinacalcet  60 mg Oral Q breakfast  . citalopram  20 mg Oral Daily  . cyanocobalamin  1,000 mcg Intramuscular Q30 days  . docusate sodium  100 mg Oral  BID  . donepezil  5 mg Oral QHS  . heparin  5,000 Units Subcutaneous 3 times per day  . multivitamin with minerals  1 tablet Oral Daily  . pravastatin  20 mg Oral q1800  . traZODone  25-50 mg Oral QHS   acetaminophen, albuterol, ALPRAZolam, baclofen, benzonatate, chlorpheniramine-HYDROcodone, guaifenesin, hydrOXYzine, lidocaine-prilocaine, ondansetron **OR** ondansetron (ZOFRAN) IV, pantoprazole, promethazine, zolpidem  Assessment/ Plan:  Ariel Smith is a 69 y.o. white female with ESRD, SHPTH, AOCD, HTN, hyperlipidemia, COPD, DVT with PE, history of tx with long term coumadin and IVC filter placement, L knee TKA, R shoulder surgery, IVC filter removed Aug 2014, pnemonia 8/16, admitted  with shortness of breath 9/16.  TTS [facility] [address]   1. ESRD on HD TTHS: Status post emergent hemodialysis on 9/9, and then hemodialysis again on 9/10. No acute indication for dialysis at this time.  Agree with cardiology about continuing fluid restriction. Currently, patient's weight is 95 kg Will set EDW as outpatient accordingly Patient was counselled on importance of fluid restriction and Cardiology follow up - continue oral lasix at home  2. Hypotension: well controlled. Midodrine before treatment.  - IV albumin before treatment on 9/10  3. Anemia of chronic kidney disease. Hemoglobin 8.3, macrocytic - 1 unit PRBC 9/10 - Continue epo with HD on TTS days - Off of anticoagulation  4. Secondary hyperparathyroidism. Patient will continue on cinacalcet. She also gets hectorol on outpatient HD.  - discontinued calcitriol - currently takes calcium acetate with meals.   5. Aortic stenosis: severe by echo in 7/16. Not SAVR candidate. May be TAVR candidate in future - f/u with [facility] cardiology - Dr Shirlee Latch    LOS: 3 Ariel Smith 9/12/201610:09 AM

## 2014-12-31 NOTE — Evaluation (Signed)
Physical Therapy Evaluation Patient Details Name: Ariel Smith MRN: [id number] DOB: October 27, 1945 Today's Date: 12/31/2014   History of Present Illness  presented to ER secondary to worsening SOB, hypoxia (initially requiring BiPAP); admitted for CHF, volume overload.  Of note, patient with multiple previous hospitalizations, most recent approx 3 weeks ago for similar diagnosis.    Clinical Impression  Upon evaluation, patient alert and oriented; eager for OOB efforts.  Demonstrates strength and ROM grossly WFL throughout all extremities; no focal weakness or sensory deficit appreciated. Able to complete bed mobility, sit/stand, basic transfers with RW, mod indep; gait (220') with RW, cga.  Good reciprocal gait pattern with fair cadence; no overt LOB or safety concerns. Maintains sats >96% with exertion on supplemental O2, but notably fatigued after effort. Would benefit from skilled PT to address above deficits and promote optimal return to PLOF; Recommend transition to HHPT upon discharge from acute hospitalization.      Follow Up Recommendations Home health PT    Equipment Recommendations       Recommendations for Other Services       Precautions / Restrictions Precautions Precautions: Fall Precaution Comments: No BP L UE (AVF) Restrictions Weight Bearing Restrictions: No      Mobility  Bed Mobility Overal bed mobility: Modified Independent Bed Mobility: Supine to Sit              Transfers Overall transfer level: Modified independent Equipment used: Rolling walker (2 wheeled) Transfers: Sit to/from Stand Sit to Stand: Modified independent (Device/Increase time)         General transfer comment: steady without loss of balance  Ambulation/Gait Ambulation/Gait assistance: Min guard Ambulation Distance (Feet): 220 Feet Assistive device: Rolling walker (2 wheeled)   Gait velocity:  (10' walk time, 7 seconds)   General Gait Details:  (reciprocal stepping  pattern with fair step height/length; fair cadence and gait speed without LOB.  Maintains sats >96% with exertion on supplemental O2.)  Stairs            Wheelchair Mobility    Modified Rankin (Stroke Patients Only)       Balance Overall balance assessment: Needs assistance Sitting-balance support: No upper extremity supported;Feet supported Sitting balance-Leahy Scale: Good     Standing balance support: Bilateral upper extremity supported Standing balance-Leahy Scale: Good                               Pertinent Vitals/Pain Pain Assessment: No/denies pain    Home Living Family/patient expects to be discharged to:: Private residence Living Arrangements: Spouse/significant other Available Help at Discharge: Family Type of Home: House Home Access: Stairs to enter Entrance Stairs-Rails: Right Entrance Stairs-Number of Steps: 5 Home Layout: One level Home Equipment: Cane - single point;Walker - 2 wheels;Walker - 4 wheels Additional Comments: Home O2 (3L at baseline)    Prior Function Level of Independence: Independent with assistive device(s)         Comments: Indep with household and limited community with RW; reports 1 fall in previous year.  Husband transports to/from dialysis.     Hand Dominance        Extremity/Trunk Assessment   Upper Extremity Assessment: Overall WFL for tasks assessed           Lower Extremity Assessment: Overall WFL for tasks assessed         Communication   Communication: No difficulties  Cognition Arousal/Alertness: Awake/alert Behavior During Therapy: WFL for  tasks assessed/performed Overall Cognitive Status: Within Functional Limits for tasks assessed                      General Comments      Exercises Other Exercises Other Exercises: Toilet transfer, ambulatory with RW, distant sup; sit/stand from standard toilet height with grab bar, close sup; standing balance for clothing management and  hygiene with RW, close sup (8 min)      Assessment/Plan    PT Assessment    PT Diagnosis Generalized weakness;Difficulty walking   PT Problem List Cardiopulmonary status limiting activity;Decreased activity tolerance;Decreased strength;Decreased balance;Decreased mobility  PT Treatment Interventions DME instruction;Gait training;Stair training;Functional mobility training;Therapeutic activities;Therapeutic exercise;Balance training;Patient/family education   PT Goals (Current goals can be found in the Care Plan section) Acute Rehab PT Goals Patient Stated Goal: "to get back home" PT Goal Formulation: With patient Time For Goal Achievement: 01/14/15 Potential to Achieve Goals: Good    Frequency Min 2X/week   Barriers to discharge        Co-evaluation               End of Session Equipment Utilized During Treatment: Gait belt;Oxygen Activity Tolerance: Patient limited by fatigue Patient left: in chair;with call bell/phone within reach (alarm pad under patient; box not available) Nurse Communication: Mobility status (O2 response to exercise; lack of alarm box in romo)         Time: 0932-1000 PT Time Calculation (min) (ACUTE ONLY): 28 min   Charges:   PT Evaluation $Initial PT Evaluation Tier I: 1 Procedure PT Treatments $Therapeutic Activity: 8-22 mins   PT G Codes:        Terie Lear H. Manson Passey, PT, DPT, NCS 12/31/2014, 10:30 AM [phone]

## 2014-12-31 NOTE — Progress Notes (Signed)
[facility] Pulmonary Medicine Consultation      Assessment and Plan:     Date: 12/31/2014  MRN# [id number] Ariel Smith [birth year]  Referring Physician:   ANTWAN BRIBIESCA is a 69 y.o. old female seen in consultation for chief complaint of:   No chief complaint on file.   HPI:   The patient is 69 year old female who was previously seen Dr. Meredeth Ide as her pulmonologist, for COPD, cough, and a history of a right-sided pleural effusion.  Review of chest x-ray images and report from 12/28/2014 shows a right hilar infiltrate, which has progressed since the previous film from 12/21/2014. Review of CT of the chest from 11/13/2014 shows multifocal multi lobar opacities. There are also bilateral small pleural effusion seen at that time.  PMHX:   Past Medical History  Diagnosis Date  . Osteoarthritis   . Depression   . Frequent headaches   . HTN (hypertension)   . HLD (hyperlipidemia)   . History of colon polyps 2013    colonoscopy ([name])  . 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 Systolic murmur     mild  . GERD (gastroesophageal reflux disease)   . COPD (chronic obstructive pulmonary disease)   . ESRD (end stage renal disease) 08/2011    TThSa, L forearm AV fistula, [name]  . Secondary hyperparathyroidism of renal origin   . Osteopenia 01/2013  . Bronchiectasis 08/2014     suggested by thoracic xray  . Anemia   . Nonrheumatic aortic valve stenosis     unspecified  . DVT (deep venous thrombosis)     Chronic   Surgical Hx:  Past Surgical History  Procedure Laterality Date  . Cholecystectomy  2012  . Bunionectomy Left 2003  . Replacement total knee Right 2006  . Shoulder arthroscopy Right 2009  . Colonoscopy  08/2011    colon biopsies, [name]  . Spirometry  04/2011    WNL  . US echocardiography  06/2008    EF >55%  . Esophagogastroduodenoscopy  08/2011    gastric  cardia polyp  . Tonsillectomy  1955  . Tubal ligation  1980  . Exteriorization of a continuous ambulatory peritoneal dialysis catheter  01/2013    removal 12/2103 - [name]  . Dexa  01/2013    osteopenia with -2.0 at hip and spine  . Hospitalization  12/2013    recurrent R pleural effusion due to peritoneal fluid translocation s/p rpt thoracentesis with 1.3 L fluid removed, ERSD started on HD this hospitalization  . US echocardiography  12/2013    EF 55-60%, nl LV sys fxn, mild-mod MR, AS, increased LV posterior wall thickness, mild TR  . Esophagogastroduodenoscopy Left 11/11/2014    Procedure: ESOPHAGOGASTRODUODENOSCOPY (EGD);  Surgeon: [name], MD;  Location: [facility] ENDOSCOPY;  Service: Endoscopy;  Laterality: Left;  . Peripheral vascular catheterization N/A 11/12/2014    Procedure: IVC Filter Insertion;  Surgeon: [name], MD;  Location: [facility] INVASIVE CV LAB;  Service: Cardiovascular;  Laterality: N/A;   Family Hx:  Family History  Problem Relation Age of Onset  . Deep vein thrombosis Brother   . Cancer Mother 34    colon  . Cancer Father     prostate  . Hypertension Father   . Hypertension Brother   . Stroke Mother   . Diabetes Neg Hx   . CAD Neg Hx   .  Cancer Daughter 95    breast  . Dementia Father 31   Social Hx:   Social History  Substance Use Topics  . Smoking status: Former Smoker -- 2.00 packs/day for 30 years    Start date: 04/20/1978    Quit date: 04/20/2009  . Smokeless tobacco: Never Used  . Alcohol Use: No   Medication:   No current outpatient prescriptions on file.    Allergies:  Aspirin and Nodolor  Review of Systems: Gen:  Denies  fever, sweats, chills HEENT: Denies blurred vision, double vision. bleeds, sore throat Cvc:  No dizziness, chest pain. Resp:   Denies cough or sputum porduction, shortness of breath Gi: Denies swallowing difficulty, stomach pain. Gu:  Denies bladder incontinence, burning urine Ext:   No Joint pain, stiffness. Skin:  No skin rash,  hives Endoc:  No polyuria, polydipsia. Psych: No depression, insomnia. Other:  All other systems were reviewed with the patient and were negative other that what is mentioned in the HPI.   Physical Examination:   VS: There were no vitals taken for this visit.  General Appearance: No distress  Neuro:without focal findings,  speech normal,  HEENT: PERRLA, EOM intact.   Pulmonary: normal breath sounds, No wheezing.  CardiovascularNormal S1,S2.  No m/r/g.   Abdomen: Benign, Soft, non-tender. Renal:  No costovertebral tenderness  GU:  No performed at this time. Endoc: No evident thyromegaly, no signs of acromegaly. Skin:   warm, no rashes, no ecchymosis  Extremities: normal, no cyanosis, clubbing.  Other findings:    LABORATORY PANEL:   CBC  Recent Labs Lab 12/30/14 0342  WBC 9.3  HGB 8.3*  HCT 25.4*  PLT 212   ------------------------------------------------------------------------------------------------------------------  Chemistries   Recent Labs Lab 12/29/14 0425  NA 144  K 3.9  CL 100*  CO2 35*  GLUCOSE 140*  BUN 17  CREATININE 3.19*  CALCIUM 9.1   ------------------------------------------------------------------------------------------------------------------  Cardiac Enzymes  Recent Labs Lab 12/28/14 1321  TROPONINI 0.07*   ------------------------------------------------------------  RADIOLOGY:  No results found.     Orders for this visit: No orders of the defined types were placed in this encounter.     Thank  you for the consultation and for allowing [facility] to assist in the care of your patient. Our recommendations are noted above.  Please contact us if we can be of further service.   Wells Guiles, MD.  Board Certified in Internal Medicine, Pulmonary Medicine, Critical Care Medicine, and Sleep Medicine.  [facility] Office Number: [phone]  Santiago Glad,  M.D.  Stephanie Acre, M.D.  Carolyne Fiscal, M.D  This encounter was created in error - please disregard.

## 2014-12-31 NOTE — Care Management Important Message (Signed)
Important Message  Patient Details  Name: Ariel Smith MRN: [id number] Date of Birth: 1945-12-31   Medicare Important Message Given:  Yes-second notification given    Eber Hong, RN 12/31/2014, 9:14 AM

## 2014-12-31 NOTE — Discharge Instructions (Signed)
Heart Failure Clinic appointment on January 17, 2015 at 9:00am with Clarisa Kindred, FNP. Please call [phone] to reschedule.

## 2015-01-01 ENCOUNTER — Encounter: Payer: Medicare Other | Admitting: Cardiovascular Disease

## 2015-01-02 ENCOUNTER — Telehealth: Payer: Self-pay | Admitting: *Deleted

## 2015-01-02 NOTE — Telephone Encounter (Signed)
Transition Care Management Follow-up Telephone Call   Date discharged? 12/31/14   How have you been since you were released from the hospital? Much improved.  SOB has resolved.   Do you understand why you were in the hospital? yes   Do you understand the discharge instructions? yes   Where were you discharged to? Home   Items Reviewed:  Medications reviewed: yes  Allergies reviewed: yes  Dietary changes reviewed: no  Referrals reviewed: yes, cardiology   Functional Questionnaire:   Activities of Daily Living (ADLs):   She states they are independent in the following: ambulation, bathing and hygiene, feeding, continence, grooming, toileting and dressing States they require assistance with the following: None   Any transportation issues/concerns?: no   Any patient concerns? no   Confirmed importance and date/time of follow-up visits scheduled yes, 01/07/15 @ 800  Provider Appointment booked with [name], MD  Confirmed with patient if condition begins to worsen call PCP or go to the ER.  Patient was given the office number and encouraged to call back with question or concerns.  : yes

## 2015-01-07 ENCOUNTER — Encounter: Payer: Self-pay | Admitting: Family Medicine

## 2015-01-07 ENCOUNTER — Ambulatory Visit (INDEPENDENT_AMBULATORY_CARE_PROVIDER_SITE_OTHER): Payer: Medicare Other | Admitting: Family Medicine

## 2015-01-07 VITALS — BP 124/78 | HR 90 | Temp 97.7°F | Wt 212.5 lb

## 2015-01-07 DIAGNOSIS — Z7189 Other specified counseling: Secondary | ICD-10-CM

## 2015-01-07 DIAGNOSIS — N186 End stage renal disease: Secondary | ICD-10-CM

## 2015-01-07 DIAGNOSIS — J810 Acute pulmonary edema: Secondary | ICD-10-CM | POA: Diagnosis not present

## 2015-01-07 DIAGNOSIS — I5033 Acute on chronic diastolic (congestive) heart failure: Secondary | ICD-10-CM | POA: Diagnosis not present

## 2015-01-07 DIAGNOSIS — I509 Heart failure, unspecified: Secondary | ICD-10-CM | POA: Diagnosis not present

## 2015-01-07 DIAGNOSIS — I350 Nonrheumatic aortic (valve) stenosis: Secondary | ICD-10-CM

## 2015-01-07 DIAGNOSIS — J9621 Acute and chronic respiratory failure with hypoxia: Secondary | ICD-10-CM

## 2015-01-07 DIAGNOSIS — D500 Iron deficiency anemia secondary to blood loss (chronic): Secondary | ICD-10-CM

## 2015-01-07 DIAGNOSIS — IMO0001 Reserved for inherently not codable concepts without codable children: Secondary | ICD-10-CM

## 2015-01-07 DIAGNOSIS — J80 Acute respiratory distress syndrome: Secondary | ICD-10-CM

## 2015-01-07 DIAGNOSIS — R197 Diarrhea, unspecified: Secondary | ICD-10-CM

## 2015-01-07 DIAGNOSIS — K922 Gastrointestinal hemorrhage, unspecified: Secondary | ICD-10-CM

## 2015-01-07 DIAGNOSIS — R0603 Acute respiratory distress: Secondary | ICD-10-CM

## 2015-01-07 DIAGNOSIS — J189 Pneumonia, unspecified organism: Secondary | ICD-10-CM

## 2015-01-07 DIAGNOSIS — J449 Chronic obstructive pulmonary disease, unspecified: Secondary | ICD-10-CM

## 2015-01-07 DIAGNOSIS — J441 Chronic obstructive pulmonary disease with (acute) exacerbation: Secondary | ICD-10-CM | POA: Diagnosis not present

## 2015-01-07 DIAGNOSIS — I82409 Acute embolism and thrombosis of unspecified deep veins of unspecified lower extremity: Secondary | ICD-10-CM

## 2015-01-07 DIAGNOSIS — Z992 Dependence on renal dialysis: Secondary | ICD-10-CM

## 2015-01-07 DIAGNOSIS — F331 Major depressive disorder, recurrent, moderate: Secondary | ICD-10-CM

## 2015-01-07 LAB — CBC WITH DIFFERENTIAL/PLATELET
BASOS ABS: 0 10*3/uL (ref 0.0–0.1)
Basophils Relative: 0.6 % (ref 0.0–3.0)
EOS ABS: 0.2 10*3/uL (ref 0.0–0.7)
Eosinophils Relative: 2.7 % (ref 0.0–5.0)
HCT: 28.4 % — ABNORMAL LOW (ref 36.0–46.0)
Hemoglobin: 9.3 g/dL — ABNORMAL LOW (ref 12.0–15.0)
LYMPHS ABS: 0.9 10*3/uL (ref 0.7–4.0)
LYMPHS PCT: 12.4 % (ref 12.0–46.0)
MCHC: 32.6 g/dL (ref 30.0–36.0)
MCV: 102.8 fl — ABNORMAL HIGH (ref 78.0–100.0)
MONO ABS: 0.8 10*3/uL (ref 0.1–1.0)
Monocytes Relative: 11.1 % (ref 3.0–12.0)
NEUTROS ABS: 5.2 10*3/uL (ref 1.4–7.7)
NEUTROS PCT: 73.2 % (ref 43.0–77.0)
PLATELETS: 238 10*3/uL (ref 150.0–400.0)
RBC: 2.76 Mil/uL — ABNORMAL LOW (ref 3.87–5.11)
RDW: 17.9 % — ABNORMAL HIGH (ref 11.5–15.5)
WBC: 7.1 10*3/uL (ref 4.0–10.5)

## 2015-01-07 LAB — BRAIN NATRIURETIC PEPTIDE: Pro B Natriuretic peptide (BNP): 1113 pg/mL — ABNORMAL HIGH (ref 0.0–100.0)

## 2015-01-07 MED ORDER — CITALOPRAM HYDROBROMIDE 20 MG PO TABS: 20.0000 mg | ORAL_TABLET | Freq: Every day | ORAL | Status: DC

## 2015-01-07 NOTE — Assessment & Plan Note (Signed)
Lungs clear today.  

## 2015-01-07 NOTE — Assessment & Plan Note (Signed)
Continue TThSat dialysis. I have asked her to check on status of lasix and midodrine with dialysis center tomorrow.

## 2015-01-07 NOTE — Assessment & Plan Note (Signed)
Continue celexa 20mg , trazodone nightly.

## 2015-01-07 NOTE — Assessment & Plan Note (Signed)
Off coumadin. Has IVC filter in place.

## 2015-01-07 NOTE — Assessment & Plan Note (Addendum)
Establishing with CHF clinic - appreciate their assistance in this complicated patient. Check BNP today, if elevated start lasix.

## 2015-01-07 NOTE — Assessment & Plan Note (Signed)
Multifactorial (COPD, CHF, AS), recent PNA's

## 2015-01-07 NOTE — Progress Notes (Signed)
BP 124/78 mmHg  Pulse 90  Temp(Src) 97.7 F (36.5 C) (Oral)  Wt 212 lb 8 oz (96.389 kg)  SpO2 96%   CC: hosp f/u visit  Subjective:    Patient ID: Ariel Smith, female    DOB: 01-01-1946, 69 y.o.   MRN: [id number]  HPI: Ariel Smith is a 69 y.o. female presenting on 01/07/2015 for Follow-up   4 hospitalizations over the last 2 months. Records reviewed. She has known COPD and is on 3L oxygen at home, ESRD on HD TThSat Thedore Mins).   7/22-11/22/2014: GI hemorrhage with hypotension (black tarry stools, Hgb 6.4, INR 2.69) complicated by bilateral hospital acquired pneumonia and fluid overload and severe aortic stenosis. She was made DNR during this hospitalization. Coumadin was held and IVC filter was placed because of LE DVTs. Given 3u pRBC. She was discharged to [facility] [place] [facility].   8/23-26/2016: HCAP and COPD exacerbation with acute resp failure with hypoxia - traeted with antibiotics and nebs, then diuresed with dialysis. Discharged home.   9/1-06/2014: CHF exacerbation with dyspnea, hypoxia and mildly elevated troponins.   9/9-03/2015: acute pulmonary edema and hypoxia from CHF exacerbation. Treated with HD and 6L fluid removed. Likely new lower dry weight. For severe aortic stenosis, suggested consider [facility] down the road. Discharge hemoglobin was 8.3.   F/u phone call: 01/02/2015  She presents today for hospital follow up visit. Remaining dyspneic, using albuterol Q3 hours. Oxygen is up to 4.5 L at home. Requests Rx for oxygen concentrator. Endorses 2d h/o watery diarrhea, more malodorous. No fever.   Advanced HH coming out to the house. PT/OT/RN coming out to house.   She has f/u with cardiology and will establish with CHF clinic.   Advanced planning - discussed. Pt desires full code at this time. Youngest daughter did not agree with DNR status so pt has decided to change this.  Relevant past medical, surgical, family and social history reviewed and updated as  indicated. Interim medical history since our last visit reviewed. Allergies and medications reviewed and updated. Current Outpatient Prescriptions on File Prior to Visit  Medication Sig  . acetaminophen (TYLENOL) 500 MG tablet Take 1,000 mg by mouth every 6 (six) hours as needed for mild pain or headache.   . albuterol (VENTOLIN HFA) 108 (90 BASE) MCG/ACT inhaler Inhale 2 puffs into the lungs every 4 (four) hours as needed for wheezing or shortness of breath.   . ALPRAZolam (XANAX) 1 MG tablet Take 1 tablet (1 mg total) by mouth 3 (three) times daily. (Patient taking differently: Take 1 mg by mouth 3 (three) times daily as needed for anxiety. )  . baclofen (LIORESAL) 10 MG tablet Take 5-10 mg by mouth 2 (two) times daily as needed for muscle spasms.  . benzonatate (TESSALON) 100 MG capsule Take 1 capsule (100 mg total) by mouth every 6 (six) hours as needed for cough.  . budesonide-formoterol (SYMBICORT) 160-4.5 MCG/ACT inhaler Inhale 2 puffs into the lungs 2 (two) times daily.  . calcitRIOL (ROCALTROL) 0.25 MCG capsule Take 0.25 mcg by mouth daily.  . calcium acetate (PHOSLO) 667 MG capsule Take 2,001 mg by mouth 3 (three) times daily with meals.   . chlorpheniramine-HYDROcodone (TUSSIONEX) 10-8 MG/5ML SUER Take 5 mLs by mouth every 12 (twelve) hours as needed for cough.  . cinacalcet (SENSIPAR) 60 MG tablet Take 60 mg by mouth daily.  . cyanocobalamin (,VITAMIN B-12,) 1000 MCG/ML injection Inject 1,000 mcg into the muscle every 30 (thirty) days.  Marland Kitchen  docusate sodium (COLACE) 100 MG capsule Take 1 capsule (100 mg total) by mouth 2 (two) times daily.  Marland Kitchen donepezil (ARICEPT) 5 MG tablet Take 1 tablet (5 mg total) by mouth at bedtime.  Marland Kitchen guaifenesin (ROBITUSSIN) 100 MG/5ML syrup Take 10 mLs (200 mg total) by mouth every 6 (six) hours as needed for cough or congestion.  . hydrOXYzine (ATARAX/VISTARIL) 25 MG tablet Take 12.5-25 mg by mouth 2 (two) times daily as needed for anxiety.  .  lidocaine-prilocaine (EMLA) cream Apply 1 application topically as needed (topical anesthesia for hemodialysis if Gebauers and Lidocaine injection are ineffective.).  Marland Kitchen lovastatin (MEVACOR) 20 MG tablet Take 20 mg by mouth at bedtime.  . Multiple Vitamin (MULTIVITAMIN WITH MINERALS) TABS tablet Take 1 tablet by mouth daily.  . Nutritional Supplements (FEEDING SUPPLEMENT, NEPRO CARB STEADY,) LIQD Take 237 mLs by mouth 2 (two) times daily between meals.  . pantoprazole (PROTONIX) 40 MG tablet Take 40 mg by mouth daily as needed (for heartburn/indigestion).  . promethazine (PHENERGAN) 12.5 MG tablet Take 12.5 mg by mouth every 6 (six) hours as needed for nausea or vomiting.  . traZODone (DESYREL) 50 MG tablet Take 0.5-1 tablets (25-50 mg total) by mouth at bedtime. (Patient taking differently: Take 25 mg by mouth at bedtime. )  . zolpidem (AMBIEN) 5 MG tablet Take 1 tablet (5 mg total) by mouth at bedtime as needed for sleep.   No current facility-administered medications on file prior to visit.    Review of Systems Per HPI unless specifically indicated above     Objective:    BP 124/78 mmHg  Pulse 90  Temp(Src) 97.7 F (36.5 C) (Oral)  Wt 212 lb 8 oz (96.389 kg)  SpO2 96%  Wt Readings from Last 3 Encounters:  01/07/15 212 lb 8 oz (96.389 kg)  12/30/14 209 lb 9.6 oz (95.074 kg)  12/22/14 214 lb 11.7 oz (97.4 kg)   Body mass index is 36.46 kg/(m^2).  Physical Exam  Constitutional: She appears well-developed and well-nourished.  HENT:  Mouth/Throat: Oropharynx is clear and moist. No oropharyngeal exudate.  Cardiovascular: Normal rate, regular rhythm and intact distal pulses.   Murmur (4/6 systolic murmur) heard. Pulmonary/Chest: Effort normal and breath sounds normal. Tachypnea noted. She has no decreased breath sounds. She has no wheezes. She has no rhonchi. She has no rales.  Dyspneic at rest  Musculoskeletal: She exhibits no edema.  Skin: Skin is warm and dry. No rash noted.    Nursing note and vitals reviewed.      Assessment & Plan:   Problem List Items Addressed This Visit    ESRD (end stage renal disease) on dialysis (Chronic)    Continue TThSat dialysis. I have asked her to check on status of lasix and midodrine with dialysis center tomorrow.      COPD (chronic obstructive pulmonary disease) (Chronic)    No wheezing on exam today. Continue symbicort with prn albuterol.      MDD (major depressive disorder), recurrent episode, moderate    Continue celexa , trazodone nightly.      Relevant Medications   citalopram (CELEXA) 20 MG tablet   Advanced care planning/counseling discussion    Discussed with patient. Initially decided DNR but after discussion with family has decided to pursue more aggressive measures "I want them to do all they can to help me"      Acute respiratory distress    Improved but not resolved.      DVT (deep venous thrombosis)  IVC filter in place. Now off coumadin 2/2 h/o GI bleed.      Aortic valvar stenosis    Complicates CHF picture.       Bleeding gastrointestinal    Off coumadin. Has IVC filter in place.      Chronic blood loss anemia    Recheck Hgb today.      Acute on chronic respiratory failure with hypoxia    Multifactorial (COPD, CHF, AS), recent PNA's      HCAP (healthcare-associated pneumonia)    This seems to have resolved, but she has persistent cough (anticipate due to other reasons)      Acute on chronic diastolic CHF (congestive heart failure) - Primary    Establishing with CHF clinic - appreciate their assistance in this complicated patient. Check BNP today, if elevated start lasix.      Obesity, Class II, BMI 35-39.9, with comorbidity   RESOLVED: Acute pulmonary edema    Lungs clear today.          Follow up plan: Return in about 3 months (around 04/08/2015) for follow up visit.

## 2015-01-07 NOTE — Assessment & Plan Note (Addendum)
Complicates CHF picture.

## 2015-01-07 NOTE — Assessment & Plan Note (Addendum)
No wheezing on exam today. Continue symbicort with prn albuterol.

## 2015-01-07 NOTE — Addendum Note (Signed)
Addended by: [name] on: 01/07/2015 08:55 AM   Modules accepted: Orders

## 2015-01-07 NOTE — Assessment & Plan Note (Signed)
IVC filter in place. Now off coumadin 2/2 h/o GI bleed.

## 2015-01-07 NOTE — Progress Notes (Signed)
Pre visit review using our clinic review tool, if applicable. No additional management support is needed unless otherwise documented below in the visit note. 

## 2015-01-07 NOTE — Assessment & Plan Note (Signed)
This seems to have resolved, but she has persistent cough (anticipate due to other reasons)

## 2015-01-07 NOTE — Assessment & Plan Note (Signed)
Discussed with patient. Initially decided DNR but after discussion with family has decided to pursue more aggressive measures "I want them to do all they can to help me"

## 2015-01-07 NOTE — Assessment & Plan Note (Signed)
Improved but not resolved. 

## 2015-01-07 NOTE — Patient Instructions (Addendum)
Check with dialysis tomorrow if you're still supposed to be taking lasix and midodrine.  Blood work today, pass by lab to pick up stool kit.  Return to see me in 3-4 weeks for follow up.

## 2015-01-07 NOTE — Assessment & Plan Note (Signed)
Recheck Hgb today 

## 2015-01-08 ENCOUNTER — Other Ambulatory Visit: Payer: Self-pay | Admitting: Family Medicine

## 2015-01-08 MED ORDER — FUROSEMIDE 20 MG PO TABS: 20.0000 mg | ORAL_TABLET | Freq: Every day | ORAL | Status: DC

## 2015-01-09 NOTE — Addendum Note (Signed)
Addended by: Alvina Chou on: 01/09/2015 11:44 AM   Modules accepted: Orders

## 2015-01-10 LAB — C. DIFFICILE GDH AND TOXIN A/B
C. DIFF TOXIN A/B: NOT DETECTED
C. DIFFICILE GDH: NOT DETECTED

## 2015-01-11 ENCOUNTER — Encounter: Payer: Self-pay | Admitting: Physician Assistant

## 2015-01-11 ENCOUNTER — Telehealth: Payer: Self-pay | Admitting: Family Medicine

## 2015-01-11 ENCOUNTER — Ambulatory Visit (INDEPENDENT_AMBULATORY_CARE_PROVIDER_SITE_OTHER): Payer: Medicare Other | Admitting: Physician Assistant

## 2015-01-11 VITALS — BP 100/63 | HR 89 | Ht 65.0 in | Wt 213.2 lb

## 2015-01-11 DIAGNOSIS — I5032 Chronic diastolic (congestive) heart failure: Secondary | ICD-10-CM

## 2015-01-11 DIAGNOSIS — R0602 Shortness of breath: Secondary | ICD-10-CM

## 2015-01-11 DIAGNOSIS — Z992 Dependence on renal dialysis: Secondary | ICD-10-CM

## 2015-01-11 DIAGNOSIS — K922 Gastrointestinal hemorrhage, unspecified: Secondary | ICD-10-CM

## 2015-01-11 DIAGNOSIS — J9611 Chronic respiratory failure with hypoxia: Secondary | ICD-10-CM

## 2015-01-11 DIAGNOSIS — J9610 Chronic respiratory failure, unspecified whether with hypoxia or hypercapnia: Secondary | ICD-10-CM | POA: Insufficient documentation

## 2015-01-11 DIAGNOSIS — I350 Nonrheumatic aortic (valve) stenosis: Secondary | ICD-10-CM | POA: Diagnosis not present

## 2015-01-11 DIAGNOSIS — D500 Iron deficiency anemia secondary to blood loss (chronic): Secondary | ICD-10-CM

## 2015-01-11 DIAGNOSIS — N186 End stage renal disease: Secondary | ICD-10-CM

## 2015-01-11 DIAGNOSIS — D638 Anemia in other chronic diseases classified elsewhere: Secondary | ICD-10-CM | POA: Insufficient documentation

## 2015-01-11 NOTE — Patient Instructions (Signed)
Medication Instructions:  Your physician recommends that you continue on your current medications as directed. Please refer to the Current Medication list given to you today.   Labwork: Pending probable cath  Testing/Procedures: none  Follow-Up: Your physician recommends that you schedule a follow-up appointment after cardiac catheterization   Any Other Special Instructions Will Be Listed Below (If Applicable). Please evaluate your ability to lie flat for 30+ minutes. Call us Monday to report if your were able to lie flat for this amount of time. If so, we will schedule you for a cardiac catheterization.  Continue to use home oxygen and monitor your levels. Call to report your oxygen level.   Angiogram An angiogram, also called angiography, is a procedure used to look at the blood vessels that carry blood to different parts of your body (arteries). In this procedure, dye is injected through a long, thin tube (catheter) into an artery. X-rays are then taken. The X-rays will show if there is a blockage or problem in a blood vessel.  LET [facility] CARE PROVIDER KNOW ABOUT:  Any allergies you have, including allergies to shellfish or contrast dye.   All medicines you are taking, including vitamins, herbs, eye drops, creams, and over-the-counter medicines.   Previous problems you or members of your family have had with the use of anesthetics.   Any blood disorders you have.   Previous surgeries you have had.  Any previous kidney problems or failure you have had.  Medical conditions you have.   Possibility of pregnancy, if this applies. RISKS AND COMPLICATIONS Generally, an angiogram is a safe procedure. However, as with any procedure, problems can occur. Possible problems include:  Injury to the blood vessels, including rupture or bleeding.  Infection or bruising at the catheter site.  Allergic reaction to the dye or contrast used.  Kidney damage from the dye or  contrast used.  Blood clots that can lead to a stroke or heart attack. BEFORE THE PROCEDURE  Do not eat or drink after midnight on the night before the procedure, or as directed by your health care provider.   Ask your health care provider if you may drink enough water to take any needed medicines the morning of the procedure.  PROCEDURE  You may be given a medicine to help you relax (sedative) before and during the procedure. This medicine is given through an IV access tube that is inserted into one of your veins.   The area where the catheter will be inserted will be washed and shaved. This is usually done in the groin but may be done in the fold of your arm (near your elbow) or in the wrist.  A medicine will be given to numb the area where the catheter will be inserted (local anesthetic).  The catheter will be inserted with a guide wire into an artery. The catheter is guided by using a type of X-ray (fluoroscopy) to the blood vessel being examined.   Dye is then injected into the catheter, and X-rays are taken. The dye helps to show where any narrowing or blockages are located.  AFTER THE PROCEDURE   If the procedure is done through the leg, you will be kept in bed lying flat for several hours. You will be instructed to not bend or cross your legs.  The insertion site will be checked frequently.  The pulse in your feet or wrist will be checked frequently.  Additional blood tests, X-rays, and electrocardiography may be done.  You may need to stay in the hospital overnight for observation.  Document Released: 01/14/2005 Document Revised: 04/11/2013 Document Reviewed: 09/07/2012 [facility] Patient Information 2015 [place], [place]. This information is not intended to replace advice given to you by your health care provider. Make sure you discuss any questions you have with your health care provider. Angiogram, Care After Refer to this sheet in the next few weeks. These  instructions provide you with information on caring for yourself after your procedure. Your health care provider may also give you more specific instructions. Your treatment has been planned according to current medical practices, but problems sometimes occur. Call your health care provider if you have any problems or questions after your procedure.  WHAT TO EXPECT AFTER THE PROCEDURE After your procedure, it is typical to have the following sensations:  Minor discomfort or tenderness and a small bump at the catheter insertion site. The bump should usually decrease in size and tenderness within 1 to 2 weeks.  Any bruising will usually fade within 2 to 4 weeks. HOME CARE INSTRUCTIONS   You may need to keep taking blood thinners if they were prescribed for you. Take medicines only as directed by your health care provider.  Do not apply powder or lotion to the site.  Do not take baths, swim, or use a hot tub until your health care provider approves.  You may shower 24 hours after the procedure. Remove the bandage (dressing) and gently wash the site with plain soap and water. Gently pat the site dry.  Inspect the site at least twice daily.  Limit your activity for the first 48 hours. Do not bend, squat, or lift anything over 20 lb (9 kg) or as directed by your health care provider.  Plan to have someone take you home after the procedure. Follow instructions about when you can drive or return to work. SEEK MEDICAL CARE IF:  You get light-headed when standing up.  You have drainage (other than a small amount of blood on the dressing).  You have chills.  You have a fever.  You have redness, warmth, swelling, or pain at the insertion site. SEEK IMMEDIATE MEDICAL CARE IF:   You develop chest pain or shortness of breath, feel faint, or pass out.  You have bleeding, swelling larger than a walnut, or drainage from the catheter insertion site.  You develop pain, discoloration, coldness, or  severe bruising in the leg or arm that held the catheter.  You develop bleeding from any other place, such as the bowels. You may see bright red blood in your urine or stools, or your stools may appear black and tarry.  You have heavy bleeding from the site. If this happens, hold pressure on the site. MAKE SURE YOU:  Understand these instructions.  Will watch your condition.  Will get help right away if you are not doing well or get worse. Document Released: 10/23/2004 Document Revised: 08/21/2013 Document Reviewed: 08/29/2012 [facility] Patient Information 2015 [place], [place]. This information is not intended to replace advice given to you by your health care provider. Make sure you discuss any questions you have with your health care provider.

## 2015-01-11 NOTE — Telephone Encounter (Signed)
I attempted to call patient on cell/home number with no answer or voicemail.

## 2015-01-11 NOTE — Progress Notes (Signed)
 Cardiology Hospital Follow Up Note:   Date of Encounter: 01/11/2015  ID: Ariel Smith, DOB 12/01/1945, MRN [id number]  PCP: Javier Gutierrez, MD Primary Cardiologist: Dr. Gollan, MD  Chief Complaint  Patient presents with  . other    C/o sob. Meds reviewed verbally with pt.     HPI:  69 year old female with history of chronic diastolic CHF, ESRD on HD (TTS), chronic respiratory failure with hypoxia presumed secondary to COPD on home oxygen for at least 1 year previously followed by Dr. Flemming, MD, severe aortic stenosis, prior DVT/PE s/p IVC filter, recent GI bleed 11/2014 leading to cessation of warfarin, HTN HLD, morbid obesity secondary hyperparathyroidism, and anemia of chronic disease and acute blood loss who presents for hospital follow up after multiple recent admissions to [facility], most recently on 9/9 to 9/12 for acute on chronic respiratory failure with hypoxia secondary to acute on chronic diastolic CHF, underlying acute on chronic anemia, severe aortic stenosis, and sinus tachycardia.   She was recently admitted 7/22-8/4 for GIB and hypotension, acute respiratory distress 2/2 PNA and fluid overload. Echo showed EF 60-65%, no RWMA, GR1DD, severe aortic stenosis, mild MR, PASP 46 mm Hg. Again in 8/23-8/26 for similar symptoms including acute respiratory failure with hypoxia, though in that admission it was secondary to HCAP. She was discharged on Augmentin. She was readmitted on 9/1-9/3 for acute on chronic respiratory failure in the setting of COPD exacerbation and acute on chronic diastolic CHF. Discharged on 2L of oxygen.   She was admitted 6 days later on 9/9 for the above acute on chronic respiratory failure admission secondary to the above. She underwent HD twice while admitted and had 6L of fluid removed. She was weaned from BiPAP to nasal cannula a 4-5L. HGB dipped to a low of 6.8 with no overt bleed found. Discharge HGB of 8.3 s/p transfusion of 1 unit pRBC with follow up  level of 9.3. She has remained off warfarin. Regarding her aortic she was deemed not a surgical candidated at this time given her multiple comorbidities and chronic respiratory failure. Consideration for this to be at a later date.   Since her admission she has seen her PCP and remained SOB. BNP was checked and elevated in the setting of her ESRD. She was restarted on Lasix 20 mg daily on 9/20, though she never started this.   Today, she reports, "I do feel better." She gets SOB with exertion and is able to catch her breath upon resting. At rest she is asymptomatic. She has "rarely" had any chest pain. She cannot recall any episodes of chest pain, and when she has had an episode she cannot tell me if it was with exertion or at rest. She is able to tell me any prior episode of chest pain was short lived, did not radiate, and was not associated with other symptoms (baseline SOB). Her weight has been stable per her report, though she is unable to tell me what her dry weight should be. She reports "I think in the low 90's, like 98." Weight today is 96.7 kg. She averages 3L of fluid pulled off each HD session. She admits to eating a poor diet of foods she knows she is not to eat, though denies adding salt to food. She is on 4L oxygen at home with oxygen saturations in the low 90's.     Past Medical History  Diagnosis Date  . Osteoarthritis   . Depression   . Frequent headaches   .   HTN (hypertension)   . HLD (hyperlipidemia)   . History of colon polyps 2013    colonoscopy (Dr. Byrnett)  . 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 GERD (gastroesophageal reflux disease)   . COPD (chronic obstructive pulmonary disease)   . ESRD (end stage renal disease) 08/2011    a. on HD (TThSa), L forearm AV fistula, Dr. Singh  . Secondary hyperparathyroidism of renal origin   . Osteopenia 01/2013  . Bronchiectasis 08/2014      suggested by thoracic xray  . Anemia of chronic disease   . Severe aortic stenosis     a. echo 10/2014: EF 60-65%, no RWMA, GR1DD, mod to sev AS (peak vel 377 cm/s, mean gradient 34 mm Hg, peak gradient 57 mm Hg, valve area (VTA) 0.72 cm^2  . Chronic respiratory failure     a. 2/2 COPD; b. on 4-5L via nasal cannula  . Morbid obesity   . GIB (gastrointestinal bleeding)     a. leading to cessation of warfarin 11/2014  . Chronic diastolic CHF (congestive heart failure)     a. echo 10/2014: EF 60-65%, no RWMA, GR1DD, severe AS, mild MR, PASP 46 mm Hg  : Past Surgical History  Procedure Laterality Date  . Cholecystectomy  2012  . Bunionectomy Left 2003  . Replacement total knee Right 2006  . Shoulder arthroscopy Right 2009  . Colonoscopy  08/2011    colon biopsies, Dr. Byrnette  . Spirometry  04/2011    WNL  . Us echocardiography  06/2008    EF >55%  . Esophagogastroduodenoscopy  08/2011    gastric cardia polyp  . Tonsillectomy  1955  . Tubal ligation  1980  . Exteriorization of a continuous ambulatory peritoneal dialysis catheter  01/2013    removal 12/2103 - Dr. Dew  . Dexa  01/2013    osteopenia with -2.0 at hip and spine  . Hospitalization  12/2013    recurrent R pleural effusion due to peritoneal fluid translocation s/p rpt thoracentesis with 1.3 L fluid removed, ERSD started on HD this hospitalization  . Us echocardiography  12/2013    EF 55-60%, nl LV sys fxn, mild-mod MR, AS, increased LV posterior wall thickness, mild TR  . Esophagogastroduodenoscopy Left 11/11/2014    Procedure: ESOPHAGOGASTRODUODENOSCOPY (EGD);  Surgeon: Paul Y Oh, MD;  Location: [facility] ENDOSCOPY;  Service: Endoscopy;  Laterality: Left;  . Peripheral vascular catheterization N/A 11/12/2014    Procedure: IVC Filter Insertion;  Surgeon: Jason S Dew, MD;  Location: [facility] INVASIVE CV LAB;  Service: Cardiovascular;  Laterality: N/A;  : Family History  Problem Relation Age of Onset  . Deep vein thrombosis Brother   .  Cancer Mother 80    colon  . Stroke Mother   . Cancer Father     prostate  . Hypertension Father   . Dementia Father 77  . Hypertension Brother   . Diabetes Neg Hx   . CAD Neg Hx   . Cancer Daughter 42    breast  :  reports that she quit smoking about 5 years ago. She started smoking about 36 years ago. She has never used smokeless tobacco. She reports that she does not drink alcohol or use illicit drugs.:   Allergies:  Allergies  Allergen Reactions  . Aspirin Other (See Comments)    Pt is unable to take because she has kidney disease.     .   Nodolor [Isometheptene-Dichloral-Apap] Other (See Comments)    Reaction:  Headaches      Home Medications:  Current Outpatient Prescriptions  Medication Sig Dispense Refill  . acetaminophen (TYLENOL) 500 MG tablet Take 1,000 mg by mouth every 6 (six) hours as needed for mild pain or headache.     . albuterol (VENTOLIN HFA) 108 (90 BASE) MCG/ACT inhaler Inhale 2 puffs into the lungs every 4 (four) hours as needed for wheezing or shortness of breath.     . ALPRAZolam (XANAX) 1 MG tablet Take 1 tablet (1 mg total) by mouth 3 (three) times daily. (Patient taking differently: Take 1 mg by mouth 3 (three) times daily as needed for anxiety. ) 30 tablet 0  . baclofen (LIORESAL) 10 MG tablet Take 5-10 mg by mouth 2 (two) times daily as needed for muscle spasms.    . benzonatate (TESSALON) 100 MG capsule Take 1 capsule (100 mg total) by mouth every 6 (six) hours as needed for cough. 20 capsule 0  . budesonide-formoterol (SYMBICORT) 160-4.5 MCG/ACT inhaler Inhale 2 puffs into the lungs 2 (two) times daily.    . calcitRIOL (ROCALTROL) 0.25 MCG capsule Take 0.25 mcg by mouth daily.    . calcium acetate (PHOSLO) 667 MG capsule Take 2,001 mg by mouth 3 (three) times daily with meals.     . chlorpheniramine-HYDROcodone (TUSSIONEX) 10-8 MG/5ML SUER Take 5 mLs by mouth every 12 (twelve) hours as needed for cough. 140 mL 0  . cinacalcet (SENSIPAR) 60 MG  tablet Take 60 mg by mouth daily.    . citalopram (CELEXA) 20 MG tablet Take 1 tablet (20 mg total) by mouth daily. 90 tablet 3  . cyanocobalamin (,VITAMIN B-12,) 1000 MCG/ML injection Inject 1,000 mcg into the muscle every 30 (thirty) days.    . docusate sodium (COLACE) 100 MG capsule Take 1 capsule (100 mg total) by mouth 2 (two) times daily. 10 capsule 0  . donepezil (ARICEPT) 5 MG tablet Take 1 tablet (5 mg total) by mouth at bedtime. 30 tablet 3  . furosemide (LASIX) 20 MG tablet Take 1 tablet (20 mg total) by mouth daily. 30 tablet 3  . guaifenesin (ROBITUSSIN) 100 MG/5ML syrup Take 10 mLs (200 mg total) by mouth every 6 (six) hours as needed for cough or congestion. 120 mL 0  . hydrOXYzine (ATARAX/VISTARIL) 25 MG tablet Take 12.5-25 mg by mouth 2 (two) times daily as needed for anxiety.    . lidocaine-prilocaine (EMLA) cream Apply 1 application topically as needed (topical anesthesia for hemodialysis if Gebauers and Lidocaine injection are ineffective.). 30 g 0  . lovastatin (MEVACOR) 20 MG tablet Take 20 mg by mouth at bedtime.    . Multiple Vitamin (MULTIVITAMIN WITH MINERALS) TABS tablet Take 1 tablet by mouth daily. 1 tablet 0  . Nutritional Supplements (FEEDING SUPPLEMENT, NEPRO CARB STEADY,) LIQD Take 237 mLs by mouth 2 (two) times daily between meals. 1 Can 0  . pantoprazole (PROTONIX) 40 MG tablet Take 40 mg by mouth daily as needed (for heartburn/indigestion).    . promethazine (PHENERGAN) 12.5 MG tablet Take 12.5 mg by mouth every 6 (six) hours as needed for nausea or vomiting.    . traZODone (DESYREL) 50 MG tablet Take 0.5-1 tablets (25-50 mg total) by mouth at bedtime. (Patient taking differently: Take 25 mg by mouth at bedtime. ) 30 tablet 3  . zolpidem (AMBIEN) 5 MG tablet Take 1 tablet (5 mg total) by mouth at bedtime as needed for sleep. 30 tablet 0     No current facility-administered medications for this visit.     Review of Systems:  Review of Systems  Constitutional:  Positive for malaise/fatigue. Negative for fever, chills, weight loss and diaphoresis.  HENT: Negative for congestion.   Eyes: Negative for discharge and redness.  Respiratory: Positive for cough and shortness of breath. Negative for hemoptysis, sputum production and wheezing.   Cardiovascular: Positive for orthopnea. Negative for chest pain, palpitations, claudication, leg swelling and PND.       Sleeps with 2 pillows at baseline for years  Gastrointestinal: Negative for heartburn, nausea, vomiting and abdominal pain.  Musculoskeletal: Negative for falls.  Skin: Negative for rash.  Neurological: Positive for weakness. Negative for sensory change, speech change, focal weakness and headaches.  Endo/Heme/Allergies: Does not bruise/bleed easily.  Psychiatric/Behavioral: The patient is not nervous/anxious.      Physical Exam:  Blood pressure 100/63, pulse 89, height 5' 5" (1.651 m), weight 213 lb 4 oz (96.73 kg). BMI: Body mass index is 35.49 kg/(m^2). General: Pleasant, NAD. Psych: Normal affect. Responds to questions with normal affect.  Neuro: Alert and oriented X 3. Moves all extremities spontaneously. HEENT: Normocephalic, atraumatic. EOM intact bilaterally. Sclera anicteric.  Neck: Trachea midline. Supple without bruits or JVD. Lungs:  Respirations regular and unlabored, CTA bilaterally without wheezing, crackles, or rhonchi.  Heart: RRR, normal s3, s4. III/VI systolic murmur at RUSB radiating towards the neck. No rubs or gallops.  Abdomen: Obese, soft, non-tender, non-distended, BS + x 4.  Extremities: No clubbing or cyanosis. Trace bilateral ankle edema. DP/PT/Radials 2+ and equal bilaterally.   Accessory Clinical Findings:  EKG: NSR, 89 bpm, poor R wave progression, nonspecific st/t flattening along lead III   Recent Labs: 11/01/2014: TSH 1.27 12/20/2014: ALT 13* 12/28/2014: B Natriuretic Peptide 2712.0* 12/29/2014: BUN 17; Creatinine, Ser 3.19*; Potassium 3.9; Sodium  144 01/07/2015: Hemoglobin 9.3*; Platelets 238.0; Pro B Natriuretic peptide (BNP) 1113.0*     Component Value Date/Time   CHOL 155 03/30/2014 1724   CHOL 164 05/22/2011 0904   TRIG 275* 03/30/2014 1724   TRIG 258 08/10/2011   TRIG 325* 05/22/2011 0904   HDL 33* 03/30/2014 1724   HDL 33* 05/22/2011 0904   CHOLHDL 4.7 03/30/2014 1724   VLDL 55* 03/30/2014 1724   VLDL 65* 05/22/2011 0904   LDLCALC 67 03/30/2014 1724   LDLCALC 123 08/10/2011   LDLCALC 66 05/22/2011 0904    Weights: Wt Readings from Last 3 Encounters:  01/11/15 213 lb 4 oz (96.73 kg)  01/07/15 212 lb 8 oz (96.389 kg)  12/30/14 209 lb 9.6 oz (95.074 kg)    Estimated Creatinine Clearance: 19.4 mL/min (by C-G formula based on Cr of 3.19).   Other studies Reviewed: Additional studies/ records that were reviewed today include: prior office notes, and multiple [facility] hospital admissions.  Assessment & Plan:  1. Chronic respiratory failure: -Breathing is improving  -Multifactorial including COPD, chronic diastolic CHF, severe aortic stenosis, cannot rule out possible ischemia, multiple recent pulmonary infections including HCAP, morbid obesity and deconditioning  -Continue oxygen at current level 4L -Patient would like a second opinion from a pulmonology point of view and would like to see [facility]  -From a cardiac standpoint patient would benefit from ischemic work up and aortic valve replacement as below    2. Severe aortic stenosis: -Recommend she move forward with replacement when she is able to lay supine -She will begin trials of laying supine and let us know her progress. If she is able to successfully lay   supine for an extended time period without issues would proceed with right and left heart catheterization to evaluate for significant ischemia as a cause for her increased SOB and as part of her aortic stenosis work up -Once her right and left heart cath have been completed final determination can be  made if she is a TAVR candidate or not  3. Chronic diastolic CHF: -Her volume status could potentially improve if she would adhere to an appropriate diet -She makes a scant amount of urine, BP is too soft to push the issue with Lasix today -BNP from 9/19 is likely to be elevated in the setting of ESRD and falsely high -Not on a beta blocker given her chronic respiratory failure    4. ESRD on HD, TTS: -Patient may benefit from lower dry weight if BP could tolerate this. She reports today's BP is an outlier for her -She needs to adhere to a heart healthy and ESRD patient's diet.   5. History of DVT/PE: -Status post IVC filter -No longer on warfarin secondary to recent GIB -Recent CBC showed improving HGB/HCT  6. Recent GIB: -Warfarin was held -IVC filter in place as above   Dispo: -Follow up post cardiac cath  Current medicines are reviewed at length with the patient today.  The patient did not have any concerns regarding medicines.   Verina Galeno, PA-C [facility] [address] [place], [place] [address] [phone] [facility] 01/11/2015, 4:44 PM   

## 2015-01-11 NOTE — Telephone Encounter (Signed)
Pt returned your call about labs please call [phone]

## 2015-01-14 ENCOUNTER — Other Ambulatory Visit: Payer: Self-pay | Admitting: *Deleted

## 2015-01-14 ENCOUNTER — Telehealth: Payer: Self-pay

## 2015-01-14 MED ORDER — ZOLPIDEM TARTRATE 5 MG PO TABS: 5.0000 mg | ORAL_TABLET | Freq: Every evening | ORAL | Status: DC | PRN

## 2015-01-14 NOTE — Telephone Encounter (Signed)
Patient called requesting a refill on Zolpidem Last refill 11/22/14 #30 Last office visit 01/07/15

## 2015-01-14 NOTE — Telephone Encounter (Signed)
Pt would like to proceed w/ cath.  When & w/ who would you like to set this up?

## 2015-01-14 NOTE — Telephone Encounter (Signed)
Pt states she is to have a heart cath. States she is able to lay flat for 30 minutes. Please call.

## 2015-01-14 NOTE — Telephone Encounter (Signed)
Please schedule patient with Dr. Excell Seltzer for cath in [place]. Please place order. Thanks.

## 2015-01-14 NOTE — Telephone Encounter (Signed)
Spoke with patient.

## 2015-01-14 NOTE — Telephone Encounter (Signed)
plz phone in. 

## 2015-01-14 NOTE — Telephone Encounter (Signed)
Rx called in as directed.   

## 2015-01-15 ENCOUNTER — Telehealth: Payer: Self-pay

## 2015-01-15 ENCOUNTER — Other Ambulatory Visit: Payer: Self-pay | Admitting: Physician Assistant

## 2015-01-15 ENCOUNTER — Other Ambulatory Visit: Payer: Self-pay

## 2015-01-15 DIAGNOSIS — Z01812 Encounter for preprocedural laboratory examination: Secondary | ICD-10-CM

## 2015-01-15 NOTE — Telephone Encounter (Signed)
S/w pt who is agreeable to cath at [facility] w/Dr. Excell Seltzer Sept 30, 12noon, arrival time 10am Pt understands to come by our office to pick up instructions as well as have labs drawn at [facility] no later than 9/28. Lab orders placed, instructions placed at front desk. Message sent to Eula Listen to place order

## 2015-01-15 NOTE — Telephone Encounter (Signed)
S/w Crystal, [facility] Cath lab, who states she has changed order to reflect left and right heart cath.

## 2015-01-15 NOTE — Telephone Encounter (Signed)
Per Eula Listen, heart cath cancelled in system by Crystal at [facility] S/w Crystal who assured [place] that left and right heart cath is in the system for pt on 9/30

## 2015-01-15 NOTE — Telephone Encounter (Signed)
Orders have been placed for patient's right and left heart cath. Please make sure cath lab at [facility] is aware this is a right and left heart cath. Thanks.

## 2015-01-15 NOTE — Patient Instructions (Addendum)
Your physician has requested that you have a cardiac catheterization. Cardiac catheterization is used to diagnose and/or treat various heart conditions. Doctors may recommend this procedure for a number of different reasons. The most common reason is to evaluate chest pain. Chest pain can be a symptom of coronary artery disease (CAD), and cardiac catheterization can show whether plaque is narrowing or blocking your heart's arteries. This procedure is also used to evaluate the valves, as well as measure the blood flow and oxygen levels in different parts of your heart.Please follow instruction sheet, as given.  [facility] Cardiac Cath Instructions   You are scheduled for a Cardiac Cath on Friday, September 30  Please arrive at 10am on the day of your procedure  Do not eat/drink anything after midnight  Someone will need to drive you home  It is recommended someone be with you for the first 24 hours after your procedure  Wear clothes that are easy to get on/off and wear slip on shoes if possible   Bring a current list of all medications with you   xx___ Do not take these medications the morning before your procedure: calcium acetate, lasix  Day of your procedure: Report to [facility]  Short Stay [address] [place] [phone]  The usual length of stay after your procedure is about 2 to 3 hours.  This can vary.  If you have any questions, please call our office at [phone]  Angiogram An angiogram, also called angiography, is a procedure used to look at the blood vessels that carry blood to different parts of your body (arteries). In this procedure, dye is injected through a long, thin tube (catheter) into an artery. X-rays are then taken. The X-rays will show if there is a blockage or problem in a blood vessel.  LET [facility] CARE PROVIDER KNOW ABOUT:  Any allergies you have, including allergies to shellfish or contrast dye.   All medicines you are  taking, including vitamins, herbs, eye drops, creams, and over-the-counter medicines.   Previous problems you or members of your family have had with the use of anesthetics.   Any blood disorders you have.   Previous surgeries you have had.  Any previous kidney problems or failure you have had.  Medical conditions you have.   Possibility of pregnancy, if this applies. RISKS AND COMPLICATIONS Generally, an angiogram is a safe procedure. However, as with any procedure, problems can occur. Possible problems include:  Injury to the blood vessels, including rupture or bleeding.  Infection or bruising at the catheter site.  Allergic reaction to the dye or contrast used.  Kidney damage from the dye or contrast used.  Blood clots that can lead to a stroke or heart attack. BEFORE THE PROCEDURE  Do not eat or drink after midnight on the night before the procedure, or as directed by your health care provider.   Ask your health care provider if you may drink enough water to take any needed medicines the morning of the procedure.  PROCEDURE  You may be given a medicine to help you relax (sedative) before and during the procedure. This medicine is given through an IV access tube that is inserted into one of your veins.   The area where the catheter will be inserted will be washed and shaved. This is usually done in the groin but may be done in the fold of your arm (near your elbow) or in the wrist.  A medicine will be given  to numb the area where the catheter will be inserted (local anesthetic).  The catheter will be inserted with a guide wire into an artery. The catheter is guided by using a type of X-ray (fluoroscopy) to the blood vessel being examined.   Dye is then injected into the catheter, and X-rays are taken. The dye helps to show where any narrowing or blockages are located.  AFTER THE PROCEDURE   If the procedure is done through the leg, you will be kept in bed lying  flat for several hours. You will be instructed to not bend or cross your legs.  The insertion site will be checked frequently.  The pulse in your feet or wrist will be checked frequently.  Additional blood tests, X-rays, and electrocardiography may be done.   You may need to stay in the hospital overnight for observation.  Document Released: 01/14/2005 Document Revised: 04/11/2013 Document Reviewed: 09/07/2012 [facility] Patient Information 2015 [place], [place]. This information is not intended to replace advice given to you by your health care provider. Make sure you discuss any questions you have with your health care provider.

## 2015-01-16 ENCOUNTER — Encounter: Payer: Self-pay | Admitting: Family

## 2015-01-16 ENCOUNTER — Ambulatory Visit: Payer: Medicare Other | Attending: Family | Admitting: Family

## 2015-01-16 ENCOUNTER — Other Ambulatory Visit
Admission: RE | Admit: 2015-01-16 | Discharge: 2015-01-16 | Disposition: A | Discharge status: Home / Self Care | Payer: Medicare Other | Source: Ambulatory Visit | Attending: Physician Assistant | Admitting: Physician Assistant

## 2015-01-16 ENCOUNTER — Telehealth: Payer: Self-pay

## 2015-01-16 VITALS — BP 103/71 | HR 96 | Resp 20 | Ht 64.0 in | Wt 213.0 lb

## 2015-01-16 DIAGNOSIS — F329 Major depressive disorder, single episode, unspecified: Secondary | ICD-10-CM | POA: Diagnosis not present

## 2015-01-16 DIAGNOSIS — N2581 Secondary hyperparathyroidism of renal origin: Secondary | ICD-10-CM | POA: Insufficient documentation

## 2015-01-16 DIAGNOSIS — Z86010 Personal history of colonic polyps: Secondary | ICD-10-CM | POA: Diagnosis not present

## 2015-01-16 DIAGNOSIS — Z8249 Family history of ischemic heart disease and other diseases of the circulatory system: Secondary | ICD-10-CM | POA: Insufficient documentation

## 2015-01-16 DIAGNOSIS — J9610 Chronic respiratory failure, unspecified whether with hypoxia or hypercapnia: Secondary | ICD-10-CM | POA: Insufficient documentation

## 2015-01-16 DIAGNOSIS — M8580 Other specified disorders of bone density and structure, unspecified site: Secondary | ICD-10-CM | POA: Insufficient documentation

## 2015-01-16 DIAGNOSIS — I5032 Chronic diastolic (congestive) heart failure: Secondary | ICD-10-CM | POA: Diagnosis present

## 2015-01-16 DIAGNOSIS — K219 Gastro-esophageal reflux disease without esophagitis: Secondary | ICD-10-CM | POA: Diagnosis not present

## 2015-01-16 DIAGNOSIS — Z87891 Personal history of nicotine dependence: Secondary | ICD-10-CM | POA: Diagnosis not present

## 2015-01-16 DIAGNOSIS — I2510 Atherosclerotic heart disease of native coronary artery without angina pectoris: Secondary | ICD-10-CM | POA: Diagnosis present

## 2015-01-16 DIAGNOSIS — I10 Essential (primary) hypertension: Secondary | ICD-10-CM

## 2015-01-16 DIAGNOSIS — N186 End stage renal disease: Secondary | ICD-10-CM | POA: Insufficient documentation

## 2015-01-16 DIAGNOSIS — Z86718 Personal history of other venous thrombosis and embolism: Secondary | ICD-10-CM | POA: Insufficient documentation

## 2015-01-16 DIAGNOSIS — J449 Chronic obstructive pulmonary disease, unspecified: Secondary | ICD-10-CM

## 2015-01-16 DIAGNOSIS — Z79899 Other long term (current) drug therapy: Secondary | ICD-10-CM | POA: Diagnosis not present

## 2015-01-16 DIAGNOSIS — I120 Hypertensive chronic kidney disease with stage 5 chronic kidney disease or end stage renal disease: Secondary | ICD-10-CM | POA: Insufficient documentation

## 2015-01-16 DIAGNOSIS — E785 Hyperlipidemia, unspecified: Secondary | ICD-10-CM | POA: Diagnosis not present

## 2015-01-16 DIAGNOSIS — I350 Nonrheumatic aortic (valve) stenosis: Secondary | ICD-10-CM | POA: Diagnosis not present

## 2015-01-16 LAB — BASIC METABOLIC PANEL
Anion gap: 11 (ref 5–15)
BUN: 37 mg/dL — ABNORMAL HIGH (ref 6–20)
CALCIUM: 8.8 mg/dL — AB (ref 8.9–10.3)
CHLORIDE: 98 mmol/L — AB (ref 101–111)
CO2: 33 mmol/L — ABNORMAL HIGH (ref 22–32)
CREATININE: 5.71 mg/dL — AB (ref 0.44–1.00)
GFR calc Af Amer: 8 mL/min — ABNORMAL LOW (ref >60)
GFR calc non Af Amer: 7 mL/min — ABNORMAL LOW (ref >60)
Glucose, Bld: 120 mg/dL — ABNORMAL HIGH (ref 65–99)
Potassium: 5 mmol/L (ref 3.5–5.1)
SODIUM: 142 mmol/L (ref 135–145)

## 2015-01-16 LAB — CBC
HCT: 29.7 % — ABNORMAL LOW (ref 35.0–47.0)
Hemoglobin: 9.8 g/dL — ABNORMAL LOW (ref 12.0–16.0)
MCH: 33.7 pg (ref 26.0–34.0)
MCHC: 33 g/dL (ref 32.0–36.0)
MCV: 102.1 fL — AB (ref 80.0–100.0)
PLATELETS: 210 10*3/uL (ref 150–440)
RBC: 2.91 MIL/uL — ABNORMAL LOW (ref 3.80–5.20)
RDW: 16.4 % — AB (ref 11.5–14.5)
WBC: 6.4 10*3/uL (ref 3.6–11.0)

## 2015-01-16 LAB — PROTIME-INR
INR: 1.04
PROTHROMBIN TIME: 13.8 s (ref 11.4–15.0)

## 2015-01-16 NOTE — Patient Instructions (Signed)
Continue weighing daily and call for an overnight weight gain of > 2 pounds or a weekly weight gain of >5 pounds. 

## 2015-01-16 NOTE — Telephone Encounter (Signed)
S/w pt regarding Friday cardiac cath. Reviewed instructions. Pt verbalized understanding and states she has no further questions.

## 2015-01-16 NOTE — Progress Notes (Signed)
Subjective:    Patient ID: Ariel Smith, female    DOB: June 11, 1945, 69 y.o.   MRN: [id number]  Congestive Heart Failure Presents for initial visit. The disease course has been stable. Associated symptoms include fatigue and shortness of breath. Pertinent negatives include no abdominal pain, chest pain, edema, orthopnea or palpitations. The symptoms have been stable. Past treatments include salt and fluid restriction and oxygen. The treatment provided mild relief. Compliance with prior treatments has been good. Her past medical history is significant for anemia, chronic lung disease, DVT, HTN and valvular heart disease. Compliance with total regimen is 76-100%.  Shortness of Breath This is a chronic problem. The current episode started more than 1 month ago. The problem occurs daily. The problem has been unchanged. Pertinent negatives include no abdominal pain, chest pain, fever, headaches, leg swelling, neck pain, PND, sore throat or wheezing. The symptoms are aggravated by any activity. The patient has no known risk factors for DVT/PE. She has tried steroid inhalers, rest and beta agonist inhalers for the symptoms. The treatment provided moderate relief. Her past medical history is significant for chronic lung disease, DVT and a heart failure.     Past Medical History  Diagnosis Date  . Osteoarthritis   . Depression   . Frequent headaches   . HTN (hypertension)   . HLD (hyperlipidemia)   . History of colon polyps 2013    colonoscopy ([name])  . 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 GERD (gastroesophageal reflux disease)   . COPD (chronic obstructive pulmonary disease)   . ESRD (end stage renal disease) 08/2011    a. on HD ([facility]), L forearm AV fistula, [name]  . Secondary hyperparathyroidism of renal origin   . Osteopenia 01/2013  . Bronchiectasis 08/2014     suggested by thoracic xray  .  Anemia of chronic disease   . Severe aortic stenosis     a. echo 10/2014: EF 60-65%, no RWMA, GR1DD, mod to sev AS (peak vel 377 cm/s, mean gradient 34 mm Hg, peak gradient 57 mm Hg, valve area (VTA) 0.72 cm^2  . Chronic respiratory failure     a. 2/2 COPD; b. on 4-5L via nasal cannula  . Morbid obesity   . GIB (gastrointestinal bleeding)     a. leading to cessation of warfarin 11/2014  . Chronic diastolic CHF (congestive heart failure)     a. echo 10/2014: EF 60-65%, no RWMA, GR1DD, severe AS, mild MR, PASP 46 mm Hg    Past Surgical History  Procedure Laterality Date  . Cholecystectomy  2012  . Bunionectomy Left 2003  . Replacement total knee Right 2006  . Shoulder arthroscopy Right 2009  . Colonoscopy  08/2011    colon biopsies, [name]  . Spirometry  04/2011    WNL  . US echocardiography  06/2008    EF >55%  . Esophagogastroduodenoscopy  08/2011    gastric cardia polyp  . Tonsillectomy  1955  . Tubal ligation  1980  . Exteriorization of a continuous ambulatory peritoneal dialysis catheter  01/2013    removal 12/2103 - [name]  . Dexa  01/2013    osteopenia with -2.0 at hip and spine  . Hospitalization  12/2013    recurrent R pleural effusion due to peritoneal fluid translocation s/p rpt thoracentesis with 1.3 L fluid removed, ERSD started on HD this hospitalization  .  US echocardiography  12/2013    EF 55-60%, nl LV sys fxn, mild-mod MR, AS, increased LV posterior wall thickness, mild TR  . Esophagogastroduodenoscopy Left 11/11/2014    Procedure: ESOPHAGOGASTRODUODENOSCOPY (EGD);  Surgeon: [name], MD;  Location: [facility] ENDOSCOPY;  Service: Endoscopy;  Laterality: Left;  . Peripheral vascular catheterization N/A 11/12/2014    Procedure: IVC Filter Insertion;  Surgeon: [name], MD;  Location: [facility] INVASIVE CV LAB;  Service: Cardiovascular;  Laterality: N/A;    Family History  Problem Relation Age of Onset  . Deep vein thrombosis Brother   . Cancer Mother 65    colon  .  Stroke Mother   . Cancer Father     prostate  . Hypertension Father   . Dementia Father 31  . Hypertension Brother   . Diabetes Neg Hx   . CAD Neg Hx   . Cancer Daughter 31    breast    Social History  Substance Use Topics  . Smoking status: Former Smoker -- 2.00 packs/day for 30 years    Types: Cigarettes    Start date: 04/20/1978    Quit date: 04/21/2007  . Smokeless tobacco: Never Used  . Alcohol Use: No    Allergies  Allergen Reactions  . Aspirin Other (See Comments)    Pt is unable to take because she has kidney disease.     Gaspar Skeeters [Isometheptene-Dichloral-Apap] Other (See Comments)    Reaction:  Headaches     Prior to Admission medications   Medication Sig Start Date End Date Taking? Authorizing Provider  acetaminophen (TYLENOL) 500 MG tablet Take 1,000 mg by mouth every 6 (six) hours as needed for mild pain or headache.    Yes Historical Provider, MD  albuterol (VENTOLIN HFA) 108 (90 BASE) MCG/ACT inhaler Inhale 2 puffs into the lungs every 4 (four) hours as needed for wheezing or shortness of breath.    Yes Historical Provider, MD  ALPRAZolam Prudy Feeler) 1 MG tablet Take 1 tablet (1 mg total) by mouth 3 (three) times daily. Patient taking differently: Take 1 mg by mouth 3 (three) times daily as needed for anxiety.  11/22/14  Yes Auburn Bilberry, MD  baclofen (LIORESAL) 10 MG tablet Take 5-10 mg by mouth 2 (two) times daily as needed for muscle spasms.   Yes Historical Provider, MD  benzonatate (TESSALON) 100 MG capsule Take 1 capsule (100 mg total) by mouth every 6 (six) hours as needed for cough. 11/22/14  Yes Auburn Bilberry, MD  budesonide-formoterol (SYMBICORT) 160-4.5 MCG/ACT inhaler Inhale 2 puffs into the lungs 2 (two) times daily.   Yes Historical Provider, MD  calcitRIOL (ROCALTROL) 0.25 MCG capsule Take 0.25 mcg by mouth daily.   Yes Historical Provider, MD  calcium acetate (PHOSLO) 667 MG capsule Take 2,001 mg by mouth 3 (three) times daily with meals.    Yes  Historical Provider, MD  chlorpheniramine-HYDROcodone (TUSSIONEX) 10-8 MG/5ML SUER Take 5 mLs by mouth every 12 (twelve) hours as needed for cough. 11/22/14  Yes Auburn Bilberry, MD  cinacalcet (SENSIPAR) 60 MG tablet Take 60 mg by mouth daily.   Yes Historical Provider, MD  citalopram (CELEXA) 20 MG tablet Take 1 tablet (20 mg total) by mouth daily. 01/07/15  Yes [name], MD  cyanocobalamin (,VITAMIN B-12,) 1000 MCG/ML injection Inject 1,000 mcg into the muscle every 30 (thirty) days.   Yes Historical Provider, MD  docusate sodium (COLACE) 100 MG capsule Take 1 capsule (100 mg total) by mouth 2 (two) times daily.  11/22/14  Yes Auburn Bilberry, MD  donepezil (ARICEPT) 5 MG tablet Take 1 tablet (5 mg total) by mouth at bedtime. 11/01/14  Yes [name], MD  furosemide (LASIX) 20 MG tablet Take 1 tablet (20 mg total) by mouth daily. 01/08/15  Yes [name], MD  guaifenesin (ROBITUSSIN) 100 MG/5ML syrup Take 10 mLs (200 mg total) by mouth every 6 (six) hours as needed for cough or congestion. 12/22/14  Yes Altamese Dilling, MD  hydrOXYzine (ATARAX/VISTARIL) 25 MG tablet Take 12.5-25 mg by mouth 2 (two) times daily as needed for anxiety.   Yes Historical Provider, MD  lidocaine-prilocaine (EMLA) cream Apply 1 application topically as needed (topical anesthesia for hemodialysis if Gebauers and Lidocaine injection are ineffective.). 11/22/14  Yes Auburn Bilberry, MD  lovastatin (MEVACOR) 20 MG tablet Take 20 mg by mouth at bedtime.   Yes Historical Provider, MD  Multiple Vitamin (MULTIVITAMIN WITH MINERALS) TABS tablet Take 1 tablet by mouth daily. 11/22/14  Yes Auburn Bilberry, MD  Nutritional Supplements (FEEDING SUPPLEMENT, NEPRO CARB STEADY,) LIQD Take 237 mLs by mouth 2 (two) times daily between meals. 11/22/14  Yes Auburn Bilberry, MD  pantoprazole (PROTONIX) 40 MG tablet Take 40 mg by mouth daily as needed (for heartburn/indigestion).   Yes Historical Provider, MD  promethazine (PHENERGAN)  12.5 MG tablet Take 12.5 mg by mouth every 6 (six) hours as needed for nausea or vomiting.   Yes Historical Provider, MD  traZODone (DESYREL) 50 MG tablet Take 0.5-1 tablets (25-50 mg total) by mouth at bedtime. Patient taking differently: Take 25 mg by mouth at bedtime.  11/01/14  Yes [name], MD  zolpidem (AMBIEN) 5 MG tablet Take 1 tablet (5 mg total) by mouth at bedtime as needed for sleep. 01/14/15  Yes [name], MD    Review of Systems  Constitutional: Positive for fatigue. Negative for fever and appetite change.  HENT: Positive for postnasal drip. Negative for congestion and sore throat.   Eyes: Negative for pain.  Respiratory: Positive for shortness of breath. Negative for cough, chest tightness and wheezing.   Cardiovascular: Negative for chest pain, palpitations, leg swelling and PND.  Gastrointestinal: Negative for abdominal pain and abdominal distention.  Endocrine: Negative.   Genitourinary: Negative for dysuria and hematuria.  Musculoskeletal: Positive for back pain. Negative for neck pain.  Skin: Negative.   Allergic/Immunologic: Negative.   Neurological: Positive for dizziness and light-headedness. Negative for headaches.  Hematological: Negative for adenopathy. Bruises/bleeds easily.  Psychiatric/Behavioral: Negative for sleep disturbance (sleeping on 1 pillow) and dysphoric mood. The patient is nervous/anxious.        Objective:   Physical Exam  Constitutional: She is oriented to person, place, and time. She appears well-developed and well-nourished.  HENT:  Head: Normocephalic and atraumatic.  Eyes: Conjunctivae are normal. Pupils are equal, round, and reactive to light.  Neck: Normal range of motion. Neck supple.  Cardiovascular: Regular rhythm.  Tachycardia present.   Murmur heard.  Systolic murmur is present with a grade of 3/6  Pulmonary/Chest: Effort normal. She has no wheezes. She has no rales.  Abdominal: Soft. She exhibits no distension.  There is no tenderness.  Musculoskeletal: She exhibits no edema or tenderness.  Neurological: She is alert and oriented to person, place, and time.  Skin: Skin is warm and dry.  Psychiatric: Her behavior is normal. Thought content normal. Her mood appears anxious.  Nursing note and vitals reviewed.    BP 103/71 mmHg  Pulse 96  Resp 20  Ht 5\' 4"  (  1.626 m)  Wt 213 lb (96.616 kg)  BMI 36.54 kg/m2  SpO2 97%      Assessment & Plan:  1: Chronic heart failure with preserved ejection fraction- Patient presents with shortness of breath and fatigue upon exertion. She says that symptoms occur with little exertion and she has to rest often. She does wear oxygen at 4L around the clock at home but when at the office, she is wearing 2L so that her oxygen tank doesn't run out. She is weighing herself but thinks the scale is messed up and her husband is going to replace the batteries. Told her that if changing the batteries doesn't work, to call us and we would give them a scale. Discussed the importance of weighing daily and to call for an overnight weight gain of >3 pounds or a weekly weight gain of >5 pounds. Also discussed that her weight may fluctuate more due to her being on dialysis (T, TH, Sat). She does not add salt to her food and they do try to read food labels. Reviewed the importance of following a  sodium diet and written information was given to them. Husband is already rinsing canned vegetables.  2: HTN- Blood pressure looks good today. She says that it tends to be high right before dialysis and then it will sometimes "bottom out" after dialysis. 3: COPD- Is going to be seeing a new pulmonologist to get another opinion on her health. Currently wearing oxygen around the clock and using inhalers. 4: Severe aortic stenosis- Is scheduled for a right and left heart cath on 01/18/15. Discussion had about getting her aortic valve fixed once she can lay flat for extended periods. She says that she  is laying flat in the bed on 1 pillow without any worsening of her shortness of breath. She says that she's not comfortable laying flat but it's not because of her breathing.   Return here in 1 month or sooner for any questions/problems before the next office visit.

## 2015-01-17 ENCOUNTER — Telehealth: Payer: Self-pay

## 2015-01-17 ENCOUNTER — Ambulatory Visit: Payer: Medicare Other | Admitting: Family

## 2015-01-17 NOTE — Telephone Encounter (Signed)
S/w pt to review instruction for cardiac cath Reviewed medications to hold, location, arrival time Pt verbalized understanding and states she has no further questions.

## 2015-01-17 NOTE — Telephone Encounter (Signed)
S/w Michael at [phone] who states pt is asleep at this time but will give message to pt to call back

## 2015-01-17 NOTE — Telephone Encounter (Signed)
Pt states she is having a procedure in the morning, and has some questions regarding her medications. Please call.

## 2015-01-17 NOTE — Telephone Encounter (Signed)
Patient returning call to sharon.  Please call back.

## 2015-01-18 ENCOUNTER — Encounter (HOSPITAL_COMMUNITY)
Admission: RE | Disposition: A | Discharge status: Home / Self Care | Payer: Self-pay | Source: Ambulatory Visit | Attending: Cardiovascular Disease

## 2015-01-18 ENCOUNTER — Ambulatory Visit (HOSPITAL_COMMUNITY)
Admission: RE | Admit: 2015-01-18 | Discharge: 2015-01-18 | Disposition: A | Discharge status: Home / Self Care | Payer: Medicare Other | Source: Ambulatory Visit | Attending: Cardiovascular Disease | Admitting: Cardiovascular Disease

## 2015-01-18 DIAGNOSIS — I5032 Chronic diastolic (congestive) heart failure: Secondary | ICD-10-CM | POA: Diagnosis not present

## 2015-01-18 DIAGNOSIS — K219 Gastro-esophageal reflux disease without esophagitis: Secondary | ICD-10-CM | POA: Diagnosis not present

## 2015-01-18 DIAGNOSIS — Z86718 Personal history of other venous thrombosis and embolism: Secondary | ICD-10-CM | POA: Insufficient documentation

## 2015-01-18 DIAGNOSIS — E785 Hyperlipidemia, unspecified: Secondary | ICD-10-CM | POA: Insufficient documentation

## 2015-01-18 DIAGNOSIS — Z6835 Body mass index (BMI) 35.0-35.9, adult: Secondary | ICD-10-CM | POA: Diagnosis not present

## 2015-01-18 DIAGNOSIS — J9610 Chronic respiratory failure, unspecified whether with hypoxia or hypercapnia: Secondary | ICD-10-CM | POA: Diagnosis not present

## 2015-01-18 DIAGNOSIS — M1990 Unspecified osteoarthritis, unspecified site: Secondary | ICD-10-CM | POA: Diagnosis not present

## 2015-01-18 DIAGNOSIS — Z86010 Personal history of colonic polyps: Secondary | ICD-10-CM | POA: Insufficient documentation

## 2015-01-18 DIAGNOSIS — I350 Nonrheumatic aortic (valve) stenosis: Secondary | ICD-10-CM

## 2015-01-18 DIAGNOSIS — D638 Anemia in other chronic diseases classified elsewhere: Secondary | ICD-10-CM | POA: Diagnosis not present

## 2015-01-18 DIAGNOSIS — I120 Hypertensive chronic kidney disease with stage 5 chronic kidney disease or end stage renal disease: Secondary | ICD-10-CM | POA: Diagnosis not present

## 2015-01-18 DIAGNOSIS — N186 End stage renal disease: Secondary | ICD-10-CM | POA: Insufficient documentation

## 2015-01-18 DIAGNOSIS — Z7951 Long term (current) use of inhaled steroids: Secondary | ICD-10-CM | POA: Insufficient documentation

## 2015-01-18 DIAGNOSIS — J449 Chronic obstructive pulmonary disease, unspecified: Secondary | ICD-10-CM | POA: Diagnosis not present

## 2015-01-18 DIAGNOSIS — Z86711 Personal history of pulmonary embolism: Secondary | ICD-10-CM | POA: Diagnosis not present

## 2015-01-18 DIAGNOSIS — F329 Major depressive disorder, single episode, unspecified: Secondary | ICD-10-CM | POA: Diagnosis not present

## 2015-01-18 DIAGNOSIS — Z992 Dependence on renal dialysis: Secondary | ICD-10-CM | POA: Diagnosis not present

## 2015-01-18 DIAGNOSIS — N2581 Secondary hyperparathyroidism of renal origin: Secondary | ICD-10-CM | POA: Diagnosis not present

## 2015-01-18 DIAGNOSIS — Z9981 Dependence on supplemental oxygen: Secondary | ICD-10-CM | POA: Insufficient documentation

## 2015-01-18 DIAGNOSIS — K922 Gastrointestinal hemorrhage, unspecified: Secondary | ICD-10-CM | POA: Diagnosis not present

## 2015-01-18 DIAGNOSIS — M8580 Other specified disorders of bone density and structure, unspecified site: Secondary | ICD-10-CM | POA: Insufficient documentation

## 2015-01-18 HISTORY — PX: CARDIAC CATHETERIZATION: SHX172

## 2015-01-18 LAB — POCT I-STAT 3, ART BLOOD GAS (G3+)
Acid-Base Excess: 9 mmol/L — ABNORMAL HIGH (ref 0.0–2.0)
Bicarbonate: 31.9 mEq/L — ABNORMAL HIGH (ref 20.0–24.0)
O2 Saturation: 99 %
PCO2 ART: 37.4 mmHg (ref 35.0–45.0)
PH ART: 7.539 — AB (ref 7.350–7.450)
TCO2: 33 mmol/L (ref 0–100)
pO2, Arterial: 105 mmHg — ABNORMAL HIGH (ref 80.0–100.0)

## 2015-01-18 LAB — POCT I-STAT 3, VENOUS BLOOD GAS (G3P V)
ACID-BASE EXCESS: 10 mmol/L — AB (ref 0.0–2.0)
ACID-BASE EXCESS: 8 mmol/L — AB (ref 0.0–2.0)
Bicarbonate: 31.9 mEq/L — ABNORMAL HIGH (ref 20.0–24.0)
Bicarbonate: 35 mEq/L — ABNORMAL HIGH (ref 20.0–24.0)
O2 SAT: 70 %
O2 SAT: 77 %
PCO2 VEN: 46.5 mmHg (ref 45.0–50.0)
PH VEN: 7.483 — AB (ref 7.250–7.300)
PO2 VEN: 39 mmHg (ref 30.0–45.0)
TCO2: 33 mmol/L (ref 0–100)
TCO2: 36 mmol/L (ref 0–100)
pCO2, Ven: 42.5 mmHg — ABNORMAL LOW (ref 45.0–50.0)
pH, Ven: 7.485 — ABNORMAL HIGH (ref 7.250–7.300)
pO2, Ven: 34 mmHg (ref 30.0–45.0)

## 2015-01-18 SURGERY — RIGHT AND LEFT HEART CATH
Anesthesia: Moderate Sedation

## 2015-01-18 SURGERY — RIGHT/LEFT HEART CATH AND CORONARY ANGIOGRAPHY
Anesthesia: LOCAL

## 2015-01-18 MED ORDER — SODIUM CHLORIDE 0.9 % IJ SOLN: 3.0000 mL | Freq: Two times a day (BID) | INTRAMUSCULAR | Status: DC

## 2015-01-18 MED ORDER — SODIUM CHLORIDE 0.9 % IJ SOLN: 3.0000 mL | INTRAMUSCULAR | Status: DC | PRN

## 2015-01-18 MED ORDER — MIDAZOLAM HCL 2 MG/2ML IJ SOLN
INTRAMUSCULAR | Status: DC | PRN
  Administered 2015-01-18 (×2): 1 mg via INTRAVENOUS

## 2015-01-18 MED ORDER — LIDOCAINE HCL (PF) 1 % IJ SOLN
INTRAMUSCULAR | Status: AC
Start: 2015-01-18 — End: 2015-01-18
  Filled 2015-01-18: qty 30

## 2015-01-18 MED ORDER — HEPARIN (PORCINE) IN NACL 2-0.9 UNIT/ML-% IJ SOLN
INTRAMUSCULAR | Status: AC
  Filled 2015-01-18: qty 1000

## 2015-01-18 MED ORDER — FENTANYL CITRATE (PF) 100 MCG/2ML IJ SOLN
INTRAMUSCULAR | Status: AC
  Filled 2015-01-18: qty 4

## 2015-01-18 MED ORDER — SODIUM CHLORIDE 0.9 % IV SOLN: 250.0000 mL | INTRAVENOUS | Status: DC | PRN

## 2015-01-18 MED ORDER — MIDAZOLAM HCL 2 MG/2ML IJ SOLN
INTRAMUSCULAR | Status: AC
  Filled 2015-01-18: qty 4

## 2015-01-18 MED ORDER — ONDANSETRON HCL 4 MG/2ML IJ SOLN: 4.0000 mg | Freq: Four times a day (QID) | INTRAMUSCULAR | Status: DC | PRN

## 2015-01-18 MED ORDER — SODIUM CHLORIDE 0.9 % WEIGHT BASED INFUSION
1.0000 mL/kg/h | INTRAVENOUS | Status: DC
  Administered 2015-01-18: 0.103 mL/kg/h via INTRAVENOUS

## 2015-01-18 MED ORDER — NITROGLYCERIN 1 MG/10 ML FOR IR/CATH LAB
INTRA_ARTERIAL | Status: AC
  Filled 2015-01-18: qty 10

## 2015-01-18 MED ORDER — SODIUM CHLORIDE 0.9 % WEIGHT BASED INFUSION: 3.0000 mL/kg/h | INTRAVENOUS | Status: AC

## 2015-01-18 MED ORDER — FENTANYL CITRATE (PF) 100 MCG/2ML IJ SOLN
INTRAMUSCULAR | Status: DC | PRN
  Administered 2015-01-18 (×2): 25 ug via INTRAVENOUS

## 2015-01-18 SURGICAL SUPPLY — 12 items
CATH EXPO 5F MPA-1 (CATHETERS) ×2
CATH INFINITI 5FR AL1 (CATHETERS) ×2
CATH INFINITI 5FR MULTPACK ANG (CATHETERS) ×2
CATH SUPER TORQUE PLUS 6F MPA1 (CATHETERS) ×2
KIT HEART LEFT (KITS) ×2
PACK CARDIAC CATHETERIZATION (CUSTOM PROCEDURE TRAY) ×2
SHEATH PINNACLE 5F 10CM (SHEATH) ×2
SHEATH PINNACLE 6F 10CM (SHEATH) ×2
SYR MEDRAD MARK V 150ML (SYRINGE) ×2
TRANSDUCER W/STOPCOCK (MISCELLANEOUS) ×4
WIRE EMERALD 3MM-J .035X150CM (WIRE) ×2
WIRE EMERALD 3MM-J .035X260CM (WIRE) ×2

## 2015-01-18 NOTE — Progress Notes (Addendum)
6Fr sheath aspirated and removed from RFV, manual pressure applied for 5 minutes. 5Fr sheath aspirated and removed from RFA, manual  Pressure applied over both sites for 20 additional minutes.   No S+S of hematoma. Groin level 0.Tegaderm dressing applied, bedrest instructions given.  Bilateral dp and pt pulses palpable.  Bedrest begins at 14:40:00   Right forearm IV converted to saline lock.

## 2015-01-18 NOTE — H&P (View-Only) (Signed)
Cardiology Hospital Follow Up Note:   Date of Encounter: 01/11/2015  ID: Ariel Smith, DOB [birth year], MRN [id number]  PCP: Ariel Boyden, MD Primary Cardiologist: [name], MD  Chief Complaint  Patient presents with  . other    C/o sob. Meds reviewed verbally with pt.     HPI:  69 year old female with history of chronic diastolic CHF, ESRD on HD (TTS), chronic respiratory failure with hypoxia presumed secondary to COPD on home oxygen for at least 1 year previously followed by [name], MD, severe aortic stenosis, prior DVT/PE s/p IVC filter, recent GI bleed 11/2014 leading to cessation of warfarin, HTN HLD, morbid obesity secondary hyperparathyroidism, and anemia of chronic disease and acute blood loss who presents for hospital follow up after multiple recent admissions to [facility], most recently on 9/9 to 9/12 for acute on chronic respiratory failure with hypoxia secondary to acute on chronic diastolic CHF, underlying acute on chronic anemia, severe aortic stenosis, and sinus tachycardia.   She was recently admitted 7/22-8/4 for GIB and hypotension, acute respiratory distress 2/2 PNA and fluid overload. Echo showed EF 60-65%, no RWMA, GR1DD, severe aortic stenosis, mild MR, PASP 46 mm Hg. Again in 8/23-8/26 for similar symptoms including acute respiratory failure with hypoxia, though in that admission it was secondary to HCAP. She was discharged on Augmentin. She was readmitted on 9/1-9/3 for acute on chronic respiratory failure in the setting of COPD exacerbation and acute on chronic diastolic CHF. Discharged on 2L of oxygen.   She was admitted 6 days later on 9/9 for the above acute on chronic respiratory failure admission secondary to the above. She underwent HD twice while admitted and had 6L of fluid removed. She was weaned from BiPAP to nasal cannula a 4-5L. HGB dipped to a low of 6.8 with no overt bleed found. Discharge HGB of 8.3 s/p transfusion of 1 unit pRBC with follow up  level of 9.3. She has remained off warfarin. Regarding her aortic she was deemed not a surgical candidated at this time given her multiple comorbidities and chronic respiratory failure. Consideration for this to be at a later date.   Since her admission she has seen her PCP and remained SOB. BNP was checked and elevated in the setting of her ESRD. She was restarted on Lasix 20 mg daily on 9/20, though she never started this.   Today, she reports, "I do feel better." She gets SOB with exertion and is able to catch her breath upon resting. At rest she is asymptomatic. She has "rarely" had any chest pain. She cannot recall any episodes of chest pain, and when she has had an episode she cannot tell me if it was with exertion or at rest. She is able to tell me any prior episode of chest pain was short lived, did not radiate, and was not associated with other symptoms (baseline SOB). Her weight has been stable per her report, though she is unable to tell me what her dry weight should be. She reports "I think in the low 90's, like 98." Weight today is 96.7 kg. She averages 3L of fluid pulled off each HD session. She admits to eating a poor diet of foods she knows she is not to eat, though denies adding salt to food. She is on 4L oxygen at home with oxygen saturations in the low 90's.     Past Medical History  Diagnosis Date  . Osteoarthritis   . Depression   . Frequent headaches   .  HTN (hypertension)   . HLD (hyperlipidemia)   . History of colon polyps 2013    colonoscopy (Dr. Lemar Smith)  . 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 GERD (gastroesophageal reflux disease)   . COPD (chronic obstructive pulmonary disease)   . ESRD (end stage renal disease) 08/2011    a. on HD (TThSa), L forearm AV fistula, Dr. Thedore Smith  . Secondary hyperparathyroidism of renal origin   . Osteopenia 01/2013  . Bronchiectasis 08/2014      suggested by thoracic xray  . Anemia of chronic disease   . Severe aortic stenosis     a. echo 10/2014: EF 60-65%, no RWMA, GR1DD, mod to sev AS (peak vel 377 cm/s, mean gradient 34 mm Hg, peak gradient 57 mm Hg, valve area (VTA) 0.72 cm^2  . Chronic respiratory failure     a. 2/2 COPD; b. on 4-5L via nasal cannula  . Morbid obesity   . GIB (gastrointestinal bleeding)     a. leading to cessation of warfarin 11/2014  . Chronic diastolic CHF (congestive heart failure)     a. echo 10/2014: EF 60-65%, no RWMA, GR1DD, severe AS, mild MR, PASP 46 mm Hg  : Past Surgical History  Procedure Laterality Date  . Cholecystectomy  2012  . Bunionectomy Left 2003  . Replacement total knee Right 2006  . Shoulder arthroscopy Right 2009  . Colonoscopy  08/2011    colon biopsies, Dr. Birdie Smith  . Spirometry  04/2011    WNL  . US echocardiography  06/2008    EF >55%  . Esophagogastroduodenoscopy  08/2011    gastric cardia polyp  . Tonsillectomy  1955  . Tubal ligation  1980  . Exteriorization of a continuous ambulatory peritoneal dialysis catheter  01/2013    removal 12/2103 - Dr. Wyn Smith  . Dexa  01/2013    osteopenia with -2.0 at hip and spine  . Hospitalization  12/2013    recurrent R pleural effusion due to peritoneal fluid translocation s/p rpt thoracentesis with 1.3 L fluid removed, ERSD started on HD this hospitalization  . US echocardiography  12/2013    EF 55-60%, nl LV sys fxn, mild-mod MR, AS, increased LV posterior wall thickness, mild TR  . Esophagogastroduodenoscopy Left 11/11/2014    Procedure: ESOPHAGOGASTRODUODENOSCOPY (EGD);  Surgeon: Ariel Cullens, MD;  Location: [facility] ENDOSCOPY;  Service: Endoscopy;  Laterality: Left;  . Peripheral vascular catheterization N/A 11/12/2014    Procedure: IVC Filter Insertion;  Surgeon: Ariel Needy, MD;  Location: [facility] INVASIVE CV LAB;  Service: Cardiovascular;  Laterality: N/A;  : Family History  Problem Relation Age of Onset  . Deep vein thrombosis Brother   .  Cancer Mother 28    colon  . Stroke Mother   . Cancer Father     prostate  . Hypertension Father   . Dementia Father 73  . Hypertension Brother   . Diabetes Neg Hx   . CAD Neg Hx   . Cancer Daughter 75    breast  :  reports that she quit smoking about 5 years ago. She started smoking about 36 years ago. She has never used smokeless tobacco. She reports that she does not drink alcohol or use illicit drugs.:   Allergies:  Allergies  Allergen Reactions  . Aspirin Other (See Comments)    Pt is unable to take because she has kidney disease.     Marland Kitchen  Nodolor [Isometheptene-Dichloral-Apap] Other (See Comments)    Reaction:  Headaches      Home Medications:  Current Outpatient Prescriptions  Medication Sig Dispense Refill  . acetaminophen (TYLENOL) 500 MG tablet Take 1,000 mg by mouth every 6 (six) hours as needed for mild pain or headache.     . albuterol (VENTOLIN HFA) 108 (90 BASE) MCG/ACT inhaler Inhale 2 puffs into the lungs every 4 (four) hours as needed for wheezing or shortness of breath.     . ALPRAZolam (XANAX) 1 MG tablet Take 1 tablet (1 mg total) by mouth 3 (three) times daily. (Patient taking differently: Take 1 mg by mouth 3 (three) times daily as needed for anxiety. ) 30 tablet 0  . baclofen (LIORESAL) 10 MG tablet Take 5-10 mg by mouth 2 (two) times daily as needed for muscle spasms.    . benzonatate (TESSALON) 100 MG capsule Take 1 capsule (100 mg total) by mouth every 6 (six) hours as needed for cough. 20 capsule 0  . budesonide-formoterol (SYMBICORT) 160-4.5 MCG/ACT inhaler Inhale 2 puffs into the lungs 2 (two) times daily.    . calcitRIOL (ROCALTROL) 0.25 MCG capsule Take 0.25 mcg by mouth daily.    . calcium acetate (PHOSLO) 667 MG capsule Take 2,001 mg by mouth 3 (three) times daily with meals.     . chlorpheniramine-HYDROcodone (TUSSIONEX) 10-8 MG/5ML SUER Take 5 mLs by mouth every 12 (twelve) hours as needed for cough. 140 mL 0  . cinacalcet (SENSIPAR) 60 MG  tablet Take 60 mg by mouth daily.    . citalopram (CELEXA) 20 MG tablet Take 1 tablet (20 mg total) by mouth daily. 90 tablet 3  . cyanocobalamin (,VITAMIN B-12,) 1000 MCG/ML injection Inject 1,000 mcg into the muscle every 30 (thirty) days.    Marland Kitchen docusate sodium (COLACE) 100 MG capsule Take 1 capsule (100 mg total) by mouth 2 (two) times daily. 10 capsule 0  . donepezil (ARICEPT) 5 MG tablet Take 1 tablet (5 mg total) by mouth at bedtime. 30 tablet 3  . furosemide (LASIX) 20 MG tablet Take 1 tablet (20 mg total) by mouth daily. 30 tablet 3  . guaifenesin (ROBITUSSIN) 100 MG/5ML syrup Take 10 mLs (200 mg total) by mouth every 6 (six) hours as needed for cough or congestion. 120 mL 0  . hydrOXYzine (ATARAX/VISTARIL) 25 MG tablet Take 12.5-25 mg by mouth 2 (two) times daily as needed for anxiety.    . lidocaine-prilocaine (EMLA) cream Apply 1 application topically as needed (topical anesthesia for hemodialysis if Gebauers and Lidocaine injection are ineffective.). 30 g 0  . lovastatin (MEVACOR) 20 MG tablet Take 20 mg by mouth at bedtime.    . Multiple Vitamin (MULTIVITAMIN WITH MINERALS) TABS tablet Take 1 tablet by mouth daily. 1 tablet 0  . Nutritional Supplements (FEEDING SUPPLEMENT, NEPRO CARB STEADY,) LIQD Take 237 mLs by mouth 2 (two) times daily between meals. 1 Can 0  . pantoprazole (PROTONIX) 40 MG tablet Take 40 mg by mouth daily as needed (for heartburn/indigestion).    . promethazine (PHENERGAN) 12.5 MG tablet Take 12.5 mg by mouth every 6 (six) hours as needed for nausea or vomiting.    . traZODone (DESYREL) 50 MG tablet Take 0.5-1 tablets (25-50 mg total) by mouth at bedtime. (Patient taking differently: Take 25 mg by mouth at bedtime. ) 30 tablet 3  . zolpidem (AMBIEN) 5 MG tablet Take 1 tablet (5 mg total) by mouth at bedtime as needed for sleep. 30 tablet 0  No current facility-administered medications for this visit.     Review of Systems:  Review of Systems  Constitutional:  Positive for malaise/fatigue. Negative for fever, chills, weight loss and diaphoresis.  HENT: Negative for congestion.   Eyes: Negative for discharge and redness.  Respiratory: Positive for cough and shortness of breath. Negative for hemoptysis, sputum production and wheezing.   Cardiovascular: Positive for orthopnea. Negative for chest pain, palpitations, claudication, leg swelling and PND.       Sleeps with 2 pillows at baseline for years  Gastrointestinal: Negative for heartburn, nausea, vomiting and abdominal pain.  Musculoskeletal: Negative for falls.  Skin: Negative for rash.  Neurological: Positive for weakness. Negative for sensory change, speech change, focal weakness and headaches.  Endo/Heme/Allergies: Does not bruise/bleed easily.  Psychiatric/Behavioral: The patient is not nervous/anxious.      Physical Exam:  Blood pressure 100/63, pulse 89, height  (1.651 m), weight 213 lb 4 oz (96.73 kg). BMI: Body mass index is 35.49 kg/(m^2). General: Pleasant, NAD. Psych: Normal affect. Responds to questions with normal affect.  Neuro: Alert and oriented X 3. Moves all extremities spontaneously. HEENT: Normocephalic, atraumatic. EOM intact bilaterally. Sclera anicteric.  Neck: Trachea midline. Supple without bruits or JVD. Lungs:  Respirations regular and unlabored, CTA bilaterally without wheezing, crackles, or rhonchi.  Heart: RRR, normal s3, s4. III/VI systolic murmur at RUSB radiating towards the neck. No rubs or gallops.  Abdomen: Obese, soft, non-tender, non-distended, BS + x 4.  Extremities: No clubbing or cyanosis. Trace bilateral ankle edema. DP/PT/Radials 2+ and equal bilaterally.   Accessory Clinical Findings:  EKG: NSR, 89 bpm, poor R wave progression, nonspecific st/t flattening along lead III   Recent Labs: 11/01/2014: TSH 1.27 12/20/2014: ALT 13* 12/28/2014: B Natriuretic Peptide 2712.0* 12/29/2014: BUN 17; Creatinine, Ser 3.19*; Potassium 3.9; Sodium  144 01/07/2015: Hemoglobin 9.3*; Platelets 238.0; Pro B Natriuretic peptide (BNP) 1113.0*     Component Value Date/Time   CHOL 155 03/30/2014 1724   CHOL 164 05/22/2011 0904   TRIG 275* 03/30/2014 1724   TRIG 258 08/10/2011   TRIG 325* 05/22/2011 0904   HDL 33* 03/30/2014 1724   HDL 33* 05/22/2011 0904   CHOLHDL 4.7 03/30/2014 1724   VLDL 55* 03/30/2014 1724   VLDL 65* 05/22/2011 0904   LDLCALC 67 03/30/2014 1724   LDLCALC 123 08/10/2011   LDLCALC 66 05/22/2011 0904    Weights: Wt Readings from Last 3 Encounters:  01/11/15 213 lb 4 oz (96.73 kg)  01/07/15 212 lb 8 oz (96.389 kg)  12/30/14 209 lb 9.6 oz (95.074 kg)    Estimated Creatinine Clearance: 19.4 mL/min (by C-G formula based on Cr of 3.19).   Other studies Reviewed: Additional studies/ records that were reviewed today include: prior office notes, and multiple [facility] hospital admissions.  Assessment & Plan:  1. Chronic respiratory failure: -Breathing is improving  -Multifactorial including COPD, chronic diastolic CHF, severe aortic stenosis, cannot rule out possible ischemia, multiple recent pulmonary infections including HCAP, morbid obesity and deconditioning  -Continue oxygen at current level 4L -Patient would like a second opinion from a pulmonology point of view and would like to see [facility]  -From a cardiac standpoint patient would benefit from ischemic work up and aortic valve replacement as below    2. Severe aortic stenosis: -Recommend she move forward with replacement when she is able to lay supine -She will begin trials of laying supine and let us know her progress. If she is able to successfully lay  supine for an extended time period without issues would proceed with right and left heart catheterization to evaluate for significant ischemia as a cause for her increased SOB and as part of her aortic stenosis work up -Once her right and left heart cath have been completed final determination can be  made if she is a TAVR candidate or not  3. Chronic diastolic CHF: -Her volume status could potentially improve if she would adhere to an appropriate diet -She makes a scant amount of urine, BP is too soft to push the issue with Lasix today -BNP from 9/19 is likely to be elevated in the setting of ESRD and falsely high -Not on a beta blocker given her chronic respiratory failure    4. ESRD on HD, TTS: -Patient may benefit from lower dry weight if BP could tolerate this. She reports today's BP is an outlier for her -She needs to adhere to a heart healthy and ESRD patient's diet.   5. History of DVT/PE: -Status post IVC filter -No longer on warfarin secondary to recent GIB -Recent CBC showed improving HGB/HCT  6. Recent GIB: -Warfarin was held -IVC filter in place as above   Dispo: -Follow up post cardiac cath  Current medicines are reviewed at length with the patient today.  The patient did not have any concerns regarding medicines.   Eula Listen, PA-C [facility] [address] [place], [place] [address] [phone] [facility] 01/11/2015, 4:44 PM

## 2015-01-18 NOTE — Interval H&P Note (Signed)
History and Physical Interval Note:  01/18/2015 1:05 PM  Ariel Smith  has presented today for surgery, with the diagnosis of shortness of breath /cp  The various methods of treatment have been discussed with the patient and family. After consideration of risks, benefits and other options for treatment, the patient has consented to  Procedure(s): Right/Left Heart Cath and Coronary Angiography (N/A) as a surgical intervention .  The patient's history has been reviewed, patient examined, no change in status, stable for surgery.  I have reviewed the patient's chart and labs.  Questions were answered to the patient's satisfaction.     Tonny Bollman

## 2015-01-18 NOTE — Discharge Instructions (Signed)

## 2015-01-19 ENCOUNTER — Encounter (HOSPITAL_COMMUNITY): Payer: Self-pay | Admitting: Cardiovascular Disease

## 2015-01-21 ENCOUNTER — Other Ambulatory Visit: Payer: Self-pay | Admitting: *Deleted

## 2015-01-21 DIAGNOSIS — I350 Nonrheumatic aortic (valve) stenosis: Secondary | ICD-10-CM

## 2015-01-21 MED FILL — Heparin Sodium (Porcine) 2 Unit/ML in Sodium Chloride 0.9%: INTRAMUSCULAR | Qty: 1000 | Status: AC

## 2015-01-21 MED FILL — Lidocaine HCl Local Preservative Free (PF) Inj 1%: INTRAMUSCULAR | Qty: 30 | Status: AC

## 2015-01-23 ENCOUNTER — Encounter: Payer: Self-pay | Admitting: Thoracic Surgery (Cardiothoracic Vascular Surgery)

## 2015-01-23 ENCOUNTER — Institutional Professional Consult (permissible substitution) (INDEPENDENT_AMBULATORY_CARE_PROVIDER_SITE_OTHER): Payer: Medicare Other | Admitting: Thoracic Surgery (Cardiothoracic Vascular Surgery)

## 2015-01-23 VITALS — BP 106/64 | HR 88 | Resp 20 | Ht 64.0 in | Wt 213.0 lb

## 2015-01-23 DIAGNOSIS — I5032 Chronic diastolic (congestive) heart failure: Secondary | ICD-10-CM | POA: Diagnosis not present

## 2015-01-23 DIAGNOSIS — I350 Nonrheumatic aortic (valve) stenosis: Secondary | ICD-10-CM

## 2015-01-23 NOTE — Progress Notes (Addendum)
[facility]  MULTIDISCIPLINARY HEART VALVE CLINIC  CARDIOTHORACIC SURGERY CONSULTATION REPORT  Referring Provider is Gollan, Tollie Pizza, MD PCP is [name], MD  Chief Complaint  Patient presents with  . Aortic Stenosis    Cardiac Cath 01/18/15, ECHO 11/15/14    HPI:  Patient is an obese 69 year old female with history of aortic stenosis, end-stage renal disease on hemodialysis, hypertension, chronic diastolic congestive heart failure, COPD on home oxygen therapy, remote history of DVT and pulmonary embolus status post IVC filter placement, chronic anemia with recent history of GI bleeding, and severe physical deconditioning with limited mobility who has been referred for surgical consultation to discuss treatment options for management of severe symptomatic aortic stenosis. The patient has long-standing history of shortness of breath and chronic respiratory failure that is likely multifactorial and has been attributed to chronic diastolic congestive heart failure and COPD. She has chronic kidney disease attributed to hypertension that became end stage in 2013. She has been on hemodialysis ever since and currently dialyzes every Tuesday, Thursday, Saturday via left forearm AV fistula in the [facility] dialysis clinic. The patient and her husband state that she has been progressively getting worse for several years.   The patient has been hospitalized several times at [facility] over the past 3 months.  She initially presented in July with an acute upper GI bleed with hemoglobin 6.4. At the time she was chronically anticoagulated using warfarin and therapeutic with INR 2.7. Upper GI endoscopy revealed gastritis. She developed acute respiratory failure that was felt to be secondary to hospital-acquired pneumonia and acute exacerbation of chronic diastolic congestive heart failure. Warfarin therapy was discontinued and the patient underwent IVC filter  placement.  She was in the intensive care unit for approximately 2 weeks and eventually discharged from the hospital 11/22/2014.  During that hospitalization she underwent transthoracic echocardiogram demonstrating the presence of severe aortic stenosis with peak velocity across the aortic valve measured 3.8 m/s corresponding to mean transvalvular gradient estimated 34 mmHg. Left ventricular systolic function appeared normal with ejection fraction estimated 60-65%. She has been hospitalized again three times since July with recurrent episodes of acute on chronic respiratory failure with hypoxia attributed to healthcare associated pneumonia and acute exacerbation of chronic diastolic and just of heart failure with fluid overload, most recently from 12/28/2014 to 12/31/2014.  Each time symptoms reportedly improved with dialysis therapy to treat volume overload. According to the patient and her husband, her dialysis treatments have been extended to facilitate better fluid removal, and she has been doing a little better over the last few weeks. However, she remains extremely limited by severe exertional shortness of breath and fatigue and she still has intermittent episodes of resting shortness of breath. She was referred to Dr. Excell Seltzer and underwent diagnostic cardiac catheterization on 01/18/2015. Findings were consistent with severe aortic stenosis with mean gradient across the aortic valve measured 40.7 mmHg. Pulmonary artery pressures were only mildly elevated and the patient did not have significant coronary artery disease.  The patient was referred for surgical consultation.  The patient is married and lives in [place] with her husband. She has 2 adult daughters and several grandchildren. One of her daughters lives next door. The patient's functional status has been quite limited for several years. She has been obese the majority of her adult life and has lived a sedentary lifestyle. She has retired since  2009, having previous and worked in a [employer].  She is limited primarily by severe  exertional shortness of breath and generalized weakness with physical deconditioning. She walks using a cane or a walker and can only walk very short distances. The patient has severe chronic exertional shortness of breath that she states first developed approximately 3 years ago and has become dramatically worse recently.  She has been on home oxygen therapy for more than 2 years and cannot be off of oxygen for more than a few minutes without developing shortness of breath.  She has intermittent resting shortness of breath and she gets short of breath with minimal activity. She has had dizzy spells without syncope. She has some tightness across her chest that seems unrelated to activity. She has some lower extremity edema.  Past Medical History  Diagnosis Date  . Osteoarthritis   . Depression   . Frequent headaches   . HTN (hypertension)   . HLD (hyperlipidemia)   . History of colon polyps 2013    colonoscopy ([name])  . 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 GERD (gastroesophageal reflux disease)   . COPD (chronic obstructive pulmonary disease) (HCC)   . ESRD (end stage renal disease) (HCC) 08/2011    a. on HD ([facility]), L forearm AV fistula, [name]  . Secondary hyperparathyroidism of renal origin (HCC)   . Osteopenia 01/2013  . Bronchiectasis (HCC) 08/2014     suggested by thoracic xray  . Anemia of chronic disease   . Severe aortic stenosis     a. echo 10/2014: EF 60-65%, no RWMA, GR1DD, mod to sev AS (peak vel 377 cm/s, mean gradient 34 mm Hg, peak gradient 57 mm Hg, valve area (VTA) 0.72 cm^2  . Chronic respiratory failure (HCC)     a. 2/2 COPD; b. on 4-5L via nasal cannula  . Morbid obesity (HCC)   . GIB (gastrointestinal bleeding)     a. leading to cessation of warfarin 11/2014  . Chronic diastolic CHF  (congestive heart failure) (HCC)     a. echo 10/2014: EF 60-65%, no RWMA, GR1DD, severe AS, mild MR, PASP 46 mm Hg  . Pulmonary embolism (HCC) 2009    Past Surgical History  Procedure Laterality Date  . Cholecystectomy  2012  . Bunionectomy Left 2003  . Replacement total knee Right 2006  . Shoulder arthroscopy Right 2009  . Colonoscopy  08/2011    colon biopsies, [name]  . Esophagogastroduodenoscopy  08/2011    gastric cardia polyp  . Tonsillectomy  1955  . Tubal ligation  1980  . Exteriorization of a continuous ambulatory peritoneal dialysis catheter  01/2013    removal 12/2103 - [name]  . Hospitalization  12/2013    recurrent R pleural effusion due to peritoneal fluid translocation s/p rpt thoracentesis with 1.3 L fluid removed, ERSD started on HD this hospitalization  . US echocardiography  12/2013    EF 55-60%, nl LV sys fxn, mild-mod MR, AS, increased LV posterior wall thickness, mild TR  . Esophagogastroduodenoscopy Left 11/11/2014    Procedure: ESOPHAGOGASTRODUODENOSCOPY (EGD);  Surgeon: [name], MD;  Location: [facility] ENDOSCOPY;  Service: Endoscopy;  Laterality: Left;  . Peripheral vascular catheterization N/A 11/12/2014    Procedure: IVC Filter Insertion;  Surgeon: [name], MD;  Location: [facility] INVASIVE CV LAB;  Service: Cardiovascular;  Laterality: N/A;  . Cardiac catheterization N/A 01/18/2015    patent coronary arteries without significant osbtruction and preserved LV function, severe  aortic stenosis; Procedure: Right/Left Heart Cath and Coronary Angiography;  Surgeon: Tonny Bollman, MD    Family History  Problem Relation Age of Onset  . Deep vein thrombosis Brother   . Cancer Mother 31    colon  . Stroke Mother   . Cancer Father     prostate  . Hypertension Father   . Dementia Father 47  . Hypertension Brother   . Diabetes Neg Hx   . CAD Neg Hx   . Cancer Daughter 31    breast    Social History   Social History  . Marital Status: Married    Spouse  Name: N/A  . Number of Children: N/A  . Years of Education: N/A   Occupational History  . Not on file.   Social History Main Topics  . Smoking status: Former Smoker -- 2.00 packs/day for 30 years    Types: Cigarettes    Start date: 04/20/1978    Quit date: 04/21/2007  . Smokeless tobacco: Never Used  . Alcohol Use: No  . Drug Use: No  . Sexual Activity: Not on file   Other Topics Concern  . Not on file   Social History Narrative    Current Outpatient Prescriptions  Medication Sig Dispense Refill  . acetaminophen (TYLENOL) 500 MG tablet Take 1,000 mg by mouth every 6 (six) hours as needed for mild pain or headache.     . albuterol (VENTOLIN HFA) 108 (90 BASE) MCG/ACT inhaler Inhale 2 puffs into the lungs every 4 (four) hours as needed for wheezing or shortness of breath.     . ALPRAZolam (XANAX) 1 MG tablet Take 1 tablet (1 mg total) by mouth 3 (three) times daily. (Patient taking differently: Take 1 mg by mouth 3 (three) times daily as needed for anxiety. ) 30 tablet 0  . baclofen (LIORESAL) 10 MG tablet Take 5-10 mg by mouth 2 (two) times daily as needed for muscle spasms.    . budesonide-formoterol (SYMBICORT) 160-4.5 MCG/ACT inhaler Inhale 2 puffs into the lungs 2 (two) times daily.    . calcitRIOL (ROCALTROL) 0.25 MCG capsule Take 0.25 mcg by mouth daily.    . calcium acetate (PHOSLO) 667 MG capsule Take 2,001 mg by mouth 3 (three) times daily with meals.     . citalopram (CELEXA) 20 MG tablet Take 1 tablet (20 mg total) by mouth daily. 90 tablet 3  . cyanocobalamin (,VITAMIN B-12,) 1000 MCG/ML injection Inject 1,000 mcg into the muscle every 30 (thirty) days.    Marland Kitchen docusate sodium (COLACE) 100 MG capsule Take 1 capsule (100 mg total) by mouth 2 (two) times daily. 10 capsule 0  . donepezil (ARICEPT) 5 MG tablet Take 1 tablet (5 mg total) by mouth at bedtime. 30 tablet 3  . furosemide (LASIX) 20 MG tablet Take 1 tablet (20 mg total) by mouth daily. 30 tablet 3  . hydrOXYzine  (ATARAX/VISTARIL) 25 MG tablet Take 12.5-25 mg by mouth 2 (two) times daily as needed for anxiety.    . lidocaine-prilocaine (EMLA) cream Apply 1 application topically as needed (topical anesthesia for hemodialysis if Gebauers and Lidocaine injection are ineffective.). 30 g 0  . lovastatin (MEVACOR) 20 MG tablet Take 20 mg by mouth at bedtime.    . Multiple Vitamin (MULTIVITAMIN WITH MINERALS) TABS tablet Take 1 tablet by mouth daily. 1 tablet 0  . pantoprazole (PROTONIX) 40 MG tablet Take 40 mg by mouth daily as needed (for heartburn/indigestion).    . promethazine (PHENERGAN) 12.5 MG  tablet Take 12.5 mg by mouth every 6 (six) hours as needed for nausea or vomiting.    . traZODone (DESYREL) 50 MG tablet Take 0.5-1 tablets (25-50 mg total) by mouth at bedtime. (Patient taking differently: Take 25 mg by mouth at bedtime. ) 30 tablet 3  . zolpidem (AMBIEN) 5 MG tablet Take 1 tablet (5 mg total) by mouth at bedtime as needed for sleep. 30 tablet 0   No current facility-administered medications for this visit.    Allergies  Allergen Reactions  . Aspirin Other (See Comments)    Pt is unable to take because she has kidney disease.     Gaspar Skeeters [Isometheptene-Dichloral-Apap] Other (See Comments)    Reaction:  Headaches       Review of Systems:   General:  normal appetite, decreased energy, no weight gain, no weight loss, no fever  Cardiac:  + chest pain with exertion, + chest pain at rest, + SOB with exertion, intermittent resting SOB, no PND, + orthopnea, no palpitations, no arrhythmia, no atrial fibrillation, + LE edema, + dizzy spells, no syncope  Respiratory:  + shortness of breath, + home oxygen, intermittent productive cough, + chronic dry cough, no bronchitis, no wheezing, no hemoptysis, no asthma, no pain with inspiration or cough, no sleep apnea, no CPAP at night  GI:   no difficulty swallowing, no reflux, no frequent heartburn, no hiatal hernia, no abdominal pain, no constipation, no  diarrhea, no hematochezia, no hematemesis, + melena  GU:   Patient still makes some urine every day, no dysuria,  no frequency, no urinary tract infection, no hematuria, no kidney stones, + kidney disease  Vascular:  no pain suggestive of claudication, no pain in feet, no leg cramps, no varicose veins, + DVT, no non-healing foot ulcer  Neuro:   no stroke, no TIA's, no seizures, no headaches, no temporary blindness one eye,  no slurred speech, no peripheral neuropathy, no chronic pain, + instability of gait, + memory/cognitive dysfunction  Musculoskeletal: + arthritis, no joint swelling, no myalgias, + difficulty walking, limited mobility   Skin:   no rash, no itching, no skin infections, no pressure sores or ulcerations  Psych:   + anxiety, + depression, + nervousness, no unusual recent stress  Eyes:   no blurry vision, no floaters, no recent vision changes, + wears glasses or contacts  ENT:   no hearing loss, no loose or painful teeth, edentulous with full dentures  Hematologic:  no easy bruising, no abnormal bleeding, possible clotting disorder, no frequent epistaxis  Endocrine:  no diabetes, does not check CBG's at home           Physical Exam:   BP 106/64 mmHg  Pulse 88  Resp 20  Ht 5\' 4"  (1.626 m)  Wt 213 lb (96.616 kg)  BMI 36.54 kg/m2  SpO2 95%  General:  Obese and debilitated-appearing  HEENT:  Unremarkable   Neck:   no JVD, no bruits, no adenopathy   Chest:   clear to auscultation, symmetrical breath sounds, no wheezes, no rhonchi   CV:   RRR, grade III/VI crescendo/decrescendo murmur heard best at RSB,  no diastolic murmur  Abdomen:  Very obese, soft, non-tender, no masses   Extremities:  warm, well-perfused, pulses not palpable, + mild bilateral LE edema  Rectal/GU  Deferred  Neuro:   Grossly non-focal and symmetrical throughout  Skin:   Clean and dry, no rashes, no breakdown   Diagnostic Tests:  Transthoracic Echocardiography  Patient:  Ariel Smith,  Ariel Smith MR #:     [id number] Study Date: 11/15/2014 Gender:   F Age:    82 Height:   162.6 cm Weight:   105 kg BSA:    2.23 m^2 Pt. Status: Room:    IC16A  Bryson Ha SONOGRAPHER Cristela Blue RDCS ATTENDING  Sharyn Creamer PERFORMING  [facility] ORDERING   Kolluru, Sarath REFERRING  Kolluru, Sarath  cc:  ------------------------------------------------------------------- LV EF: 60% -  65%  ------------------------------------------------------------------- Indications:   424.1 Aortic valve disorders.  ------------------------------------------------------------------- Study Conclusions  - Left ventricle: The cavity size was normal. Systolic function was normal. The estimated ejection fraction was in the range of 60% to 65%. Wall motion was normal; there were no regional wall motion abnormalities. Doppler parameters are consistent with abnormal left ventricular relaxation (grade 1 diastolic dysfunction). - Aortic valve: Transvalvular velocity was increased. There was moderate to severe stenosis. Peak velocity (S): 377 cm/s. Mean gradient (S): 34 mm Hg. Peak gradient (S): 57 mm Hg. Valve area (VTI): 0.72 cm^2. - Mitral valve: There was mild regurgitation. - Left atrium: The atrium was mildly dilated. - Right ventricle: Systolic function was normal. - Pulmonary arteries: Systolic pressure was mildly elevated. PA peak pressure: 46 mm Hg (S).  Transthoracic echocardiography. M-mode, complete 2D, spectral Doppler, and color Doppler. Birthdate: Patient birthdate: Oct 22, 1945. Age: Patient is 69 yr old. Sex: Gender: female. BMI: 39.7 kg/m^2. Patient status: Inpatient. Study date: Study date: 11/15/2014. Study time: 12:25 PM.  -------------------------------------------------------------------  ------------------------------------------------------------------- Left ventricle: The cavity size was normal. Systolic  function was normal. The estimated ejection fraction was in the range of 60% to 65%. Wall motion was normal; there were no regional wall motion abnormalities. Doppler parameters are consistent with abnormal left ventricular relaxation (grade 1 diastolic dysfunction).  ------------------------------------------------------------------- Aortic valve:  Trileaflet; normal thickness, mildly calcified leaflets. Mobility was not restricted. Doppler: Transvalvular velocity was increased. There was moderate to severe stenosis. There was no regurgitation.  VTI ratio of LVOT to aortic valve: 0.23. Valve area (VTI): 0.72 cm^2. Indexed valve area (VTI): 0.32 cm^2/m^2. Peak velocity ratio of LVOT to aortic valve: 0.22. Valve area (Vmax): 0.69 cm^2. Indexed valve area (Vmax): 0.31 cm^2/m^2. Mean velocity ratio of LVOT to aortic valve: 0.19. Valve area (Vmean): 0.6 cm^2. Indexed valve area (Vmean): 0.27 cm^2/m^2. Mean gradient (S): 34 mm Hg. Peak gradient (S): 57 mm Hg.  ------------------------------------------------------------------- Aorta: Aortic root: The aortic root was normal in size.  ------------------------------------------------------------------- Mitral valve:  Structurally normal valve.  Mobility was not restricted. Doppler: Transvalvular velocity was within the normal range. There was no evidence for stenosis. There was mild regurgitation.  Peak gradient (D): 4 mm Hg.  ------------------------------------------------------------------- Left atrium: The atrium was mildly dilated.  ------------------------------------------------------------------- Right ventricle: The cavity size was normal. Wall thickness was normal. Systolic function was normal.  ------------------------------------------------------------------- Pulmonic valve:  Doppler: Transvalvular velocity was within the normal range. There was no evidence for  stenosis.  ------------------------------------------------------------------- Tricuspid valve:  Structurally normal valve.  Doppler: Transvalvular velocity was within the normal range. There was mild regurgitation.  ------------------------------------------------------------------- Pulmonary artery:  The main pulmonary artery was normal-sized. Systolic pressure was mildly elevated.  ------------------------------------------------------------------- Right atrium: The atrium was normal in size.  ------------------------------------------------------------------- Pericardium: There was no pericardial effusion.  ------------------------------------------------------------------- Systemic veins: Inferior vena cava: The vessel was normal in size.  ------------------------------------------------------------------- Measurements  Left ventricle              Value     Reference LV ID, ED, PLAX  chordal      (L)   42.9 mm    43 - 52 LV ID, ES, PLAX chordal          28.1 mm    23 - 38 LV fx shortening, PLAX chordal      34  %    >=29 LV PW thickness, ED            11.9 mm    --------- IVS/LV PW ratio, ED            1.04      <=1.3 Stroke volume, 2D             63  ml    --------- Stroke volume/bsa, 2D           28  ml/m^2  --------- LV e&', lateral              10.6 cm/s   --------- LV E/e&', lateral             8.88      --------- LV e&', medial               4.24 cm/s   --------- LV E/e&', medial              22.19     --------- LV e&', average              7.42 cm/s   --------- LV E/e&', average             12.68     ---------  Ventricular septum            Value     Reference IVS thickness, ED             12.4 mm     ---------  LVOT                   Value     Reference LVOT ID, S                20  mm    --------- LVOT area                 3.14 cm^2   --------- LVOT peak velocity, S           82.8 cm/s   --------- LVOT mean velocity, S           51.1 cm/s   --------- LVOT VTI, S                20  cm    ---------  Aortic valve               Value     Reference Aortic valve peak velocity, S       377  cm/s   --------- Aortic valve mean velocity, S       266  cm/s   --------- Aortic valve VTI, S            87  cm    --------- Aortic mean gradient, S          34  mm Hg  --------- Aortic peak gradient, S          57  mm Hg  --------- VTI ratio, LVOT/AV            0.23      --------- Aortic valve area, VTI          0.72 cm^2   ---------  Aortic valve area/bsa, VTI        0.32 cm^2/m^2 --------- Velocity ratio, peak, LVOT/AV       0.22      --------- Aortic valve area, peak velocity     0.69 cm^2   --------- Aortic valve area/bsa, peak        0.31 cm^2/m^2 --------- velocity Velocity ratio, mean, LVOT/AV       0.19      --------- Aortic valve area, mean velocity     0.6  cm^2   --------- Aortic valve area/bsa, mean        0.27 cm^2/m^2 --------- velocity  Aorta                   Value     Reference Aortic root ID, ED            24  mm    ---------  Left atrium                Value     Reference LA ID, A-P, ES              45  mm    --------- LA ID/bsa, A-P              2.02 cm/m^2  <=2.2 LA volume, ES, 1-p A4C          68.2 ml    --------- LA volume/bsa, ES, 1-p A4C         30.6 ml/m^2  --------- LA volume, ES, 1-p A2C          56.5 ml    --------- LA volume/bsa, ES, 1-p A2C        25.3 ml/m^2  ---------  Mitral valve               Value     Reference Mitral E-wave peak velocity        94.1 cm/s   --------- Mitral A-wave peak velocity        114  cm/s   --------- Mitral deceleration time         179  ms    150 - 230 Mitral peak gradient, D          4   mm Hg  --------- Mitral E/A ratio, peak          0.8      ---------  Pulmonary arteries            Value     Reference PA pressure, S, DP        (H)   46  mm Hg  <=30  Right ventricle              Value     Reference RV ID, ED, PLAX              37  mm    19 - 38  Legend: (L) and (H) mark values outside specified reference range.  ------------------------------------------------------------------- Prepared and Electronically Authenticated by  Dossie Arbour, MD, [facility] 2016-07-28T14:58:03    CARDIAC CATHETERIZATION  Procedures    Right/Left Heart Cath and Coronary Angiography    Conclusion    1. 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 Normal left ventricular systolic function 3. Calcified aortic valve with hemodynamic findings consistent with severe aortic stenosis  Evaluation by the Multidisciplinary heart valve team for consideration of treatment options in  the setting of aortic stenosis and multiple medical comorbidities.    Indications    Severe aortic stenosis [I35.0 (ICD-10-CM)]    Technique and Indications    INDICATION: Severe aortic stenosis  PROCEDURAL DETAILS: The right groin was prepped, draped, and anesthetized with 1% lidocaine. Using the modified Seldinger technique a 5 French  sheath was placed in the right femoral artery and a 6 French sheath was placed in the right femoral vein. A multipurpose catheter was used for the right heart catheterization. Caution was taken in the inferior vena cava because of an indwelling IVC filter. Standard protocol was followed for recording of right heart pressures and sampling of oxygen saturations. Fick cardiac output was calculated. Standard Judkins catheters were used for selective coronary angiography, aortic root angiography, and left ventriculography. There were no immediate procedural complications. The patient was transferred to the post catheterization recovery area for further monitoring.  Estimated blood loss <50 mL.    Coronary Findings    Dominance: Right   Left Anterior Descending  The vessel is angiographically normal.     Left Circumflex  The vessel is angiographically normal. 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 The left circumflex arises from the proximal RCA     Right Coronary Artery  The vessel is angiographically normal. Dominant vessel with no obstructive disease       Right Heart Pressures Hemodynamic findings consistent with aortic stenosis. Elevated LV EDP consistent with volume overload.    Wall Motion                 Left Heart    Left Ventricle The left ventricular size is normal. The left ventricular systolic function is normal. The left ventricular ejection fraction is 55-65% by visual estimate. There are no wall motion abnormalities in the left ventricle.   Aortic Valve There is severe aortic valve stenosis. The aortic valve is calcified. There is restricted aortic valve motion. The aortic valve is crossed with a J-wire without too much difficulty. The transaortic peak to peak gradient is 45 mmHg, the mean gradient is 41 mmHg, the aortic valve area is 1.1 cm. 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, by Fick technique the cardiac output is 9.1 L/m      Coronary Diagrams    Diagnostic Diagram            Implants    Name ID Temporary Type Supply   No information to display    PACS Images    Show images for Cardiac catheterization     Link to Procedure Log    Procedure Log      Hemo Data       Most Recent Value   Fick Cardiac Output  9.12 L/min   Fick Cardiac Output Index  4.54 (L/min)/BSA   Aortic Mean Gradient  40.7 mmHg   Aortic Peak Gradient  45 mmHg   Aortic Valve Area  1.09   Aortic Value Area Index  0.54 cm2/BSA   RA A Wave  10 mmHg   RA V Wave  5 mmHg   RA Mean  6 mmHg   RV Systolic Pressure  37 mmHg   RV Diastolic Pressure  2 mmHg   RV EDP  4 mmHg   PA Systolic Pressure  34 mmHg   PA Diastolic Pressure  9 mmHg   PA Mean  20 mmHg   PW A Wave  16 mmHg   PW V Wave  23  mmHg   PW Mean  14 mmHg   AO Systolic Pressure  124 mmHg   AO Diastolic Pressure  66 mmHg   AO Mean  90 mmHg   LV Systolic Pressure  176 mmHg   LV Diastolic Pressure  13 mmHg   LV EDP  23 mmHg   Arterial Occlusion Pressure Extended Systolic Pressure  134 mmHg   Arterial Occlusion Pressure Extended Diastolic Pressure  71 mmHg   Arterial Occlusion Pressure Extended Mean Pressure  99 mmHg   Left Ventricular Apex Extended Systolic Pressure  179 mmHg   Left Ventricular Apex Extended Diastolic Pressure  14 mmHg   Left Ventricular Apex Extended EDP Pressure  22 mmHg   QP/QS  0.76   TPVR Index  5.81 HRUI   TSVR Index  19.84 HRUI   PVR SVR Ratio  0.09   TPVR/TSVR Ratio  0.29    STS Risk Calculator  Procedure    AVR  Risk of Mortality   14.3% Morbidity or Mortality  50.2% Prolonged LOS   32.9% Short LOS    6.2% Permanent Stroke   2.8% Prolonged Vent Support  45.4% DSW Infection    1.7% Renal Failure    n/a% Reoperation    17.4%    Impression:  Patient has stage D severe symptomatic aortic stenosis. She describes symptoms consistent with chronic diastolic congestive heart failure,  New York Heart Association functional class IIIB-IV.  She has been hospitalized numerous times over the last few months with acute exacerbations of respiratory failure. Her chronic respiratory failure is clearly multifactorial and related to chronic diastolic congestive heart failure, morbid obesity, and oxygen-dependent COPD.  She also has remote history of DVT and pulmonary embolus and recently was taken off of warfarin anticoagulation because of GI bleeding.  Her congestive heart failure has been difficult to manage given the presence of dialysis-dependent renal failure and severe aortic stenosis. I have personally reviewed the patient's recent transthoracic echocardiogram and diagnostic cardiac catheterization. Echocardiogram confirmed the presence of severe aortic stenosis with moderate to severe thickening, calcification, and restricted leaflet mobility involving all 3 leaflets of the patient's aortic valve. Peak velocity across the aortic valve approaches 4 m/s and the dimensionless velocity ratio measured between the LVOT and the aortic valve is relatively low (0.22). Mean transvalvular gradient was measured greater than 40 mmHg at catheterization. Right heart catheterization was notable for only mild elevation of pulmonary artery pressures and relatively high cardiac output, potentially related to the patient's underlying chronic anemia.  Risks associated with conventional surgical aortic valve replacement would be extremely high. Given the patient's severe physical deconditioning and numerous comorbid medical problems I would not consider her a candidate for conventional surgical aortic valve replacement under any circumstances.  Options include transcatheter aortic valve replacement or long-term palliative medical therapy.  Her long-term prognosis is very poor, with predicted 1 year survival <25% without intervention, and as such it may be reasonable to consider transcatheter aortic valve replacement.  If  she does well it is conceivable that elimination of the patient's aortic stenosis could lead to significant improvement in symptoms of congestive heart failure and management of her volume status using hemodialysis.  However, even following TAVR her predicted 1 year survival might be only 50% at best.   Plan:  The patient and her husband were counseled at length regarding treatment alternatives for management of severe symptomatic aortic stenosis. Alternative approaches such as conventional aortic valve replacement, transcatheter aortic valve replacement, and palliative medical therapy  were compared and contrasted at length.  The risks associated with conventional surgical aortic valve replacement were been discussed in detail, as were reasons why I think the patient would likely do poor with surgery and should not be considered a candidate. Long-term prognosis with medical therapy was discussed. This discussion was placed in the context of the patient's own specific clinical presentation and past medical history.  All of their questions been addressed.  The patient desires to proceed with further diagnostic workup to explore whether or not transcatheter aortic valve replacement might be feasible. As a next step she will undergo CT angiography, pulmonary function tests, and a formal physical therapy evaluation. Her case will be reviewed by a multidisciplinary team at specialists. She will return for follow-up within the next few weeks to review the results of her tests and discuss options further.   I spent in excess of 90 minutes during the conduct of this office consultation and >50% of this time involved direct face-to-face encounter with the patient for counseling and/or coordination of their care.    Salvatore Decent. Cornelius Moras, MD 01/23/2015 1:31 PM

## 2015-01-23 NOTE — Patient Instructions (Signed)
Continue all previous medications without any changes at this time  

## 2015-01-25 ENCOUNTER — Ambulatory Visit (HOSPITAL_COMMUNITY)
Admission: RE | Admit: 2015-01-25 | Discharge: 2015-01-25 | Disposition: A | Discharge status: Home / Self Care | Payer: Medicare Other | Source: Ambulatory Visit | Attending: Cardiovascular Disease | Admitting: Cardiovascular Disease

## 2015-01-25 ENCOUNTER — Ambulatory Visit (HOSPITAL_COMMUNITY): Payer: Medicare Other

## 2015-01-25 ENCOUNTER — Encounter (HOSPITAL_COMMUNITY): Payer: Self-pay

## 2015-01-25 DIAGNOSIS — I517 Cardiomegaly: Secondary | ICD-10-CM | POA: Insufficient documentation

## 2015-01-25 DIAGNOSIS — R918 Other nonspecific abnormal finding of lung field: Secondary | ICD-10-CM | POA: Diagnosis not present

## 2015-01-25 DIAGNOSIS — K5730 Diverticulosis of large intestine without perforation or abscess without bleeding: Secondary | ICD-10-CM | POA: Insufficient documentation

## 2015-01-25 DIAGNOSIS — I350 Nonrheumatic aortic (valve) stenosis: Secondary | ICD-10-CM

## 2015-01-25 HISTORY — DX: Disorder of kidney and ureter, unspecified: N28.9

## 2015-01-25 MED ORDER — METOPROLOL TARTRATE 1 MG/ML IV SOLN
INTRAVENOUS | Status: AC
  Administered 2015-01-25: 5 mg via INTRAVENOUS
  Filled 2015-01-25: qty 5

## 2015-01-25 MED ORDER — METOPROLOL TARTRATE 1 MG/ML IV SOLN
5.0000 mg | INTRAVENOUS | Status: DC | PRN
  Administered 2015-01-25: 5 mg via INTRAVENOUS
  Administered 2015-01-25: 2.5 mg via INTRAVENOUS
  Administered 2015-01-25: 5 mg via INTRAVENOUS
  Administered 2015-01-25: 2.5 mg via INTRAVENOUS
  Filled 2015-01-25: qty 5

## 2015-01-25 MED ORDER — IOHEXOL 350 MG/ML SOLN
80.0000 mL | Freq: Once | INTRAVENOUS | Status: AC | PRN
  Administered 2015-01-25: 80 mL via INTRAVENOUS

## 2015-01-25 MED ORDER — IOHEXOL 350 MG/ML SOLN
70.0000 mL | Freq: Once | INTRAVENOUS | Status: AC | PRN
  Administered 2015-01-25: 70 mL via INTRAVENOUS

## 2015-01-25 MED ORDER — METOPROLOL TARTRATE 1 MG/ML IV SOLN
INTRAVENOUS | Status: AC
  Administered 2015-01-25: 2.5 mg via INTRAVENOUS
  Filled 2015-01-25: qty 10

## 2015-01-28 ENCOUNTER — Other Ambulatory Visit: Payer: Self-pay | Admitting: Family Medicine

## 2015-01-28 MED ORDER — BACLOFEN 10 MG PO TABS: 5.0000 mg | ORAL_TABLET | Freq: Two times a day (BID) | ORAL | Status: DC | PRN

## 2015-01-28 MED ORDER — ALPRAZOLAM 1 MG PO TABS: 1.0000 mg | ORAL_TABLET | Freq: Three times a day (TID) | ORAL | Status: DC | PRN

## 2015-01-28 NOTE — Telephone Encounter (Signed)
It appears this may have been given while she was in-patient. Ok to refill?

## 2015-01-28 NOTE — Telephone Encounter (Signed)
Spoke to patient for clarification on rx refills.  We have not previously prescribed Xanax for pt, and she is also requesting a "muscle relaxer" for her back pain.  Offered several appointment choices, pt unable to come in due to dialysis.  Pharmacy of choice is [facility].  Best number to call pt is [phone]

## 2015-01-28 NOTE — Telephone Encounter (Signed)
plz phone in alprazolam. Baclofen sent to pharmacy.

## 2015-01-28 NOTE — Telephone Encounter (Signed)
Pt would like refill on  ALPRAZolam (XANAX) 1 MG tablet 30 tablet 0 11/22/2014      Sig - Route: Take 1 tablet (1 mg total) by mouth 3 (three) times daily. - Oral    Patient taking differently: Take 1 mg by mouth 3 (three) times daily as needed for anxiety.         And also

## 2015-01-28 NOTE — Telephone Encounter (Signed)
Pt returned your call Best number [phone]

## 2015-01-29 NOTE — Telephone Encounter (Signed)
Rx called in as directed.   

## 2015-02-01 ENCOUNTER — Encounter: Payer: Self-pay | Admitting: Physical Therapy

## 2015-02-01 ENCOUNTER — Ambulatory Visit: Payer: Medicare Other | Attending: Cardiovascular Disease | Admitting: Physical Therapy

## 2015-02-01 ENCOUNTER — Ambulatory Visit (HOSPITAL_COMMUNITY)
Admission: RE | Admit: 2015-02-01 | Discharge: 2015-02-01 | Disposition: A | Discharge status: Home / Self Care | Payer: Medicare Other | Source: Ambulatory Visit | Attending: Cardiovascular Disease | Admitting: Cardiovascular Disease

## 2015-02-01 DIAGNOSIS — M6281 Muscle weakness (generalized): Secondary | ICD-10-CM | POA: Insufficient documentation

## 2015-02-01 DIAGNOSIS — R262 Difficulty in walking, not elsewhere classified: Secondary | ICD-10-CM

## 2015-02-01 DIAGNOSIS — F17210 Nicotine dependence, cigarettes, uncomplicated: Secondary | ICD-10-CM | POA: Diagnosis not present

## 2015-02-01 DIAGNOSIS — R05 Cough: Secondary | ICD-10-CM | POA: Insufficient documentation

## 2015-02-01 DIAGNOSIS — R0609 Other forms of dyspnea: Secondary | ICD-10-CM | POA: Diagnosis not present

## 2015-02-01 DIAGNOSIS — I350 Nonrheumatic aortic (valve) stenosis: Secondary | ICD-10-CM | POA: Insufficient documentation

## 2015-02-01 LAB — PULMONARY FUNCTION TEST
DL/VA % pred: 1 %
DL/VA: 0.09 ml/min/mmHg/L
DLCO UNC: 0.17 ml/min/mmHg
DLCO unc % pred: 0 %
FEF 25-75 Post: 2.31 L/sec
FEF 25-75 Pre: 2.37 L/sec
FEF2575-%Change-Post: -2 %
FEF2575-%Pred-Post: 117 %
FEF2575-%Pred-Pre: 121 %
FEV1-%CHANGE-POST: 0 %
FEV1-%PRED-PRE: 86 %
FEV1-%Pred-Post: 86 %
FEV1-POST: 1.99 L
FEV1-PRE: 2.01 L
FEV1FVC-%CHANGE-POST: 0 %
FEV1FVC-%Pred-Pre: 111 %
FEV6-%Change-Post: 0 %
FEV6-%PRED-PRE: 81 %
FEV6-%Pred-Post: 81 %
FEV6-POST: 2.36 L
FEV6-PRE: 2.37 L
FEV6FVC-%PRED-POST: 104 %
FEV6FVC-%PRED-PRE: 104 %
FVC-%Change-Post: 0 %
FVC-%PRED-PRE: 77 %
FVC-%Pred-Post: 77 %
FVC-POST: 2.36 L
FVC-PRE: 2.37 L
POST FEV6/FVC RATIO: 100 %
Post FEV1/FVC ratio: 85 %
Pre FEV1/FVC ratio: 85 %
Pre FEV6/FVC Ratio: 100 %
RV % PRED: 364 %
RV: 7.88 L
TLC % PRED: 201 %
TLC: 10.17 L

## 2015-02-01 MED ORDER — ALBUTEROL SULFATE (2.5 MG/3ML) 0.083% IN NEBU
2.5000 mg | INHALATION_SOLUTION | Freq: Once | RESPIRATORY_TRACT | Status: AC
  Administered 2015-02-01: 2.5 mg via RESPIRATORY_TRACT

## 2015-02-01 NOTE — Therapy (Signed)
[facility] [address] [place], [place], [address] Phone: [phone]   Fax:  [phone]  Physical Therapy Evaluation  Patient Details  Name: Ariel Smith MRN: [id number] Date of Birth: Sep 11, 1945 Referring Provider: Dr. Tonny Bollman  Encounter Date: 02/01/2015      PT End of Session - 02/01/15 1204    Visit Number 1   PT Start Time 1105   PT Stop Time 1152   PT Time Calculation (min) 47 min      Past Medical History  Diagnosis Date  . Osteoarthritis   . Depression   . Frequent headaches   . HTN (hypertension)   . HLD (hyperlipidemia)   . History of colon polyps 2013    colonoscopy ([name])  . 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 GERD (gastroesophageal reflux disease)   . COPD (chronic obstructive pulmonary disease) (HCC)   . ESRD (end stage renal disease) (HCC) 08/2011    a. on HD ([facility]), L forearm AV fistula, [name]  . Secondary hyperparathyroidism of renal origin (HCC)   . Osteopenia 01/2013  . Bronchiectasis (HCC) 08/2014     suggested by thoracic xray  . Anemia of chronic disease   . Severe aortic stenosis     a. echo 10/2014: EF 60-65%, no RWMA, GR1DD, mod to sev AS (peak vel 377 cm/s, mean gradient 34 mm Hg, peak gradient 57 mm Hg, valve area (VTA) 0.72 cm^2  . Chronic respiratory failure (HCC)     a. 2/2 COPD; b. on 4-5L via nasal cannula  . Morbid obesity (HCC)   . GIB (gastrointestinal bleeding)     a. leading to cessation of warfarin 11/2014  . Chronic diastolic CHF (congestive heart failure) (HCC)     a. echo 10/2014: EF 60-65%, no RWMA, GR1DD, severe AS, mild MR, PASP 46 mm Hg  . Pulmonary embolism (HCC) 2009  . Renal insufficiency     Patient has been on dialysis for " four years" -per patient.    Past Surgical History  Procedure Laterality Date  . Cholecystectomy  2012  . Bunionectomy Left  2003  . Replacement total knee Right 2006  . Shoulder arthroscopy Right 2009  . Colonoscopy  08/2011    colon biopsies, [name]  . Esophagogastroduodenoscopy  08/2011    gastric cardia polyp  . Tonsillectomy  1955  . Tubal ligation  1980  . Exteriorization of a continuous ambulatory peritoneal dialysis catheter  01/2013    removal 12/2103 - [name]  . Hospitalization  12/2013    recurrent R pleural effusion due to peritoneal fluid translocation s/p rpt thoracentesis with 1.3 L fluid removed, ERSD started on HD this hospitalization  . US echocardiography  12/2013    EF 55-60%, nl LV sys fxn, mild-mod MR, AS, increased LV posterior wall thickness, mild TR  . Esophagogastroduodenoscopy Left 11/11/2014    Procedure: ESOPHAGOGASTRODUODENOSCOPY (EGD);  Surgeon: [name], MD;  Location[facility] ENDOSCOPY;  Service: Endoscopy;  Laterality: Left;  . Peripheral vascular catheterization N/A 11/12/2014    Procedure: IVC Filter Insertion;  Surgeon: [name], MD;  Location: [facility] INVASIVE CV LAB;  Service: Cardiovascular;  Laterality: N/A;  . Cardiac catheterization N/A 01/18/2015    patent coronary arteries without significant osbtruction and preserved LV function, severe aortic stenosis; Procedure: Right/Left Heart Cath and Coronary Angiography;  Surgeon: Tonny Bollman,  MD    There were no vitals filed for this visit.  Visit Diagnosis:  Difficulty walking - Plan: PT plan of care cert/re-cert  Generalized muscle weakness - Plan: PT plan of care cert/re-cert  Severe aortic stenosis - Plan: PT plan of care cert/re-cert      Subjective Assessment - 02/01/15 1107    Subjective poor historian as far as length of symtpoms, recently began needing around the clock supplemental oxygen, occasionally chest uncomfortable feeling, main symptom is SOB which has progressively worsened   Patient Stated Goals take care of heart problem   Currently in Pain? No/denies            [facility] PT Assessment -  02/01/15 0001    Assessment   Medical Diagnosis severe aortic stenosis   Referring Provider Dr. Tonny Bollman   Precautions   Precautions None;Other (comment)   Precaution Comments NO BP L ARM, 24 supplemental oxygen   Restrictions   Weight Bearing Restrictions No   Balance Screen   Has the patient fallen in the past 6 months No   Has the patient had a decrease in activity level because of a fear of falling?  Yes   Is the patient reluctant to leave their home because of a fear of falling?  Yes  comfortable to leave home wiht cane and supervision   Home Environment   Living Environment Private residence   Living Arrangements Spouse/significant other   Home Access Stairs to enter   Entrance Stairs-Number of Steps 8   Entrance Stairs-Rails Left   Additional Comments in process of having ramp built for entry in the back   Prior Function   Level of Independence Independent with household mobility with device   Posture/Postural Control   Posture/Postural Control Postural limitations   Postural Limitations Forward head   ROM / Strength   AROM / PROM / Strength AROM;Strength   AROM   Overall AROM Comments grossly WNL   Strength   Overall Strength Comments grossly 4-/5 UE, LLE 4/5 throughout/RLE 4-/5 except bil ankle DF 4/5   Strength Assessment Site Hand   Right/Left hand Right;Left   Right Hand Grip (lbs) 46   Left Hand Grip (lbs) 38   Ambulation/Gait   Gait Comments Pt ambulates with single point cane and slow speed.    6 Minute Walk- Baseline   6 Minute Walk- Baseline yes   BP (mmHg) 102/60 mmHg   HR (bpm) 83   02 Sat (%RA) 99 %   Modified Borg Scale for Dyspnea 0- Nothing at all   Perceived Rate of Exertion (Borg) 6-   6 Minute walk- Post Test   6 Minute Walk Post Test yes   BP (mmHg) 118/70 mmHg   HR (bpm) 94   02 Sat (%RA) 88 %   Modified Borg Scale for Dyspnea 1- Very mild shortness of breath   Perceived Rate of Exertion (Borg) 11- Fairly light          [facility]  Pre-Surgical Assessment - 02/01/15 0001    5 Meter Walk Test- trial 1 11.4 sec   5 Meter Walk Test- trial 2 13 sec.    5 Meter Walk Test- trial 3 10.4 sec.  >6 sec indicates incr. slow speed   5 meter walk test average 11.6 sec   Timed Up & Go Test trial  22 sec.   Comments >/= 12 sec indicates incr. fall risk   4 Stage Balance Test tolerated for:  10 sec.  4 Stage Balance Test Position 2   comment inability to hold position 3 x 10 seconds indicates increased fall risk   Comment unable to without UE support   ADL/IADL Independent with: Bathing;Dressing   ADL/IADL Needs Assistance with: Meal prep;Finances;Yard work   ADL/IADL Freight forwarder Index Midly frail   Aerobic Endurance Distance Walked 235   Endurance additional comments Pt required seated rest break at 2:05 (lasted 3:15). Pt reported SOB as 1/10 on modified Borg Scale for Dypsnea although I would have rated it moderate based on observation.                          PT Education - 02-17-2015 1200-08-25    Education provided Yes   Education Details fall risk, continued use of cane   Person(s) Educated Patient   Methods Explanation   Comprehension Verbalized understanding                    Plan - 2015-02-17 1204    Clinical Impression Statement Pt is a 69 yo female presenting to OP PT for evaluation prior to possible TAVR surgery due to severe aortic stenosis. Pt presents with primary c/o progressing SOB with recent need to use supplemental O2 around the clock - she reports 3-3.5L. Pt also reports occasionally feelings of chest being uncomfortable and general weakness. Pt utilizes single point cane for community mobility and is afraid to go out without the help of her husband. Pt is on HD 3 days/wk. Pt presents with ROM WNL, fair to good strength, poor to fair balance and at increased fall risk, and poor  aerobic endurance per 6 minute walk test.    PT Frequency One time visit   Consulted and Agree with Plan of  Care Patient          G-Codes - Feb 17, 2015 08/26/1207    Functional Assessment Tool Used 6 minute walk 235' with single point cane   Functional Limitation Mobility: Walking and moving around   Mobility: Walking and Moving Around Current Status [phone]) At least 60 percent but less than 80 percent impaired, limited or restricted   Mobility: Walking and Moving Around Goal Status [phone]) At least 60 percent but less than 80 percent impaired, limited or restricted   Mobility: Walking and Moving Around Discharge Status [phone]) At least 60 percent but less than 80 percent impaired, limited or restricted       Problem List Patient Active Problem List   Diagnosis Date Noted  . Severe aortic stenosis   . Chronic respiratory failure (HCC)   . Anemia of chronic disease   . Chronic diastolic CHF (congestive heart failure) (HCC)   . Obesity, Class II, BMI 35-39.9, with comorbidity (HCC) 01/07/2015  . COPD (chronic obstructive pulmonary disease) (HCC) 12/20/2014  . DVT (deep venous thrombosis) (HCC) 11/16/2014  . Aortic valvar stenosis 11/16/2014  . Vitamin D deficiency 11/03/2014  . Memory deficit 11/01/2014  . Right-sided thoracic back pain 08/17/2014  . Advanced care planning/counseling discussion 03/30/2014  . Health maintenance examination 03/30/2014  . Vitamin B12 deficiency 01/27/2014  . Medicare annual wellness visit, subsequent 01/27/2013  . Osteopenia 01/18/2013  . Chronic cough 10/22/2012  . Osteoarthritis   . MDD (major depressive disorder), recurrent episode, moderate (HCC)   . HTN (hypertension)   . HLD (hyperlipidemia)   . GERD (gastroesophageal reflux disease)   . ESRD (end stage renal disease) on dialysis (HCC) 08/19/2011    NICOLETTA,DANA, PT 02-17-2015,  12:12 PM  [facility] [address] [place], [place], [address] Phone: [phone]   Fax:  [phone]  Name: AIDEL DAVISSON MRN: [id number] Date of Birth:  January 22, 1946

## 2015-02-04 ENCOUNTER — Other Ambulatory Visit: Payer: Self-pay | Admitting: *Deleted

## 2015-02-04 DIAGNOSIS — I350 Nonrheumatic aortic (valve) stenosis: Secondary | ICD-10-CM

## 2015-02-09 ENCOUNTER — Other Ambulatory Visit: Payer: Self-pay | Admitting: Family Medicine

## 2015-02-11 ENCOUNTER — Encounter: Payer: Self-pay | Admitting: Surgery

## 2015-02-11 ENCOUNTER — Institutional Professional Consult (permissible substitution) (INDEPENDENT_AMBULATORY_CARE_PROVIDER_SITE_OTHER): Payer: Medicare Other | Admitting: Surgery

## 2015-02-11 VITALS — BP 140/86 | HR 86 | Resp 20 | Ht 64.0 in | Wt 214.0 lb

## 2015-02-11 DIAGNOSIS — I350 Nonrheumatic aortic (valve) stenosis: Secondary | ICD-10-CM | POA: Diagnosis not present

## 2015-02-11 DIAGNOSIS — I5032 Chronic diastolic (congestive) heart failure: Secondary | ICD-10-CM | POA: Diagnosis not present

## 2015-02-11 NOTE — Telephone Encounter (Signed)
Ok to refill 

## 2015-02-12 ENCOUNTER — Other Ambulatory Visit: Payer: Self-pay | Admitting: Family Medicine

## 2015-02-12 ENCOUNTER — Encounter: Payer: Self-pay | Admitting: Surgery

## 2015-02-12 NOTE — Progress Notes (Signed)
Patient ID: Ariel Smith, female   DOB: 05-Feb-1946, 69 y.o.   MRN: [id number]   [facility]  MULTIDISCIPLINARY HEART VALVE CLINIC    CARDIOTHORACIC SURGERY CONSULTATION REPORT  Referring Provider is Tonny Bollman, MD PCP is [name], MD  Chief Complaint  Patient presents with  . Aortic Stenosis    2nd TAVR eval, surgery sch'ed for 02/19/2015    HPI:  The patient is a 69 year old woman with hypertension, hyperlipidemia, ESRD on HD, COPD on home oxygen with a long history of 2ppd smoking for 30 yrs, remote history of DVT and PE treated with coumadin, chronic anemia with recent acute upper GI blood loss requiring discontinuation of coumadin and IVC filter. She has a long-standing history of shortness of breath and chronic diastolic heart failure. Her echo in July 2016 showed severe aortic stenosis with peak velocity across the aortic valve measured at 3.8 m/s corresponding to a mean transvalvular gradient of 34 mmHg. Left ventricular systolic function appeared normal with an ejection fraction of 60-65%. She has been hospitalized  three times since July with recurrent episodes of acute on chronic respiratory failure with hypoxemia attributed to healthcare associated pneumonia and acute exacerbation of chronic diastolic heart failure with fluid overload, most recently from 12/28/2014 to 12/31/2014. Her symptoms reportedly improved with dialysis therapy to treat volume overload and according to the patient and her husband her dialysis treatments have been extended to facilitate better fluid removal. She remains extremely limited by severe exertional shortness of breath and fatigue and she still has intermittent episodes of resting shortness of breath. Cardiac cath on 01/18/2015 showed severe AS with a mean gradient of 40 mm Hg with no significant coronary disease.  Past Medical History  Diagnosis Date  . Osteoarthritis   . Depression   . Frequent headaches   . HTN  (hypertension)   . HLD (hyperlipidemia)   . History of colon polyps 2013    colonoscopy ([name])  . 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 GERD (gastroesophageal reflux disease)   . COPD (chronic obstructive pulmonary disease) (HCC)   . ESRD (end stage renal disease) (HCC) 08/2011    a. on HD (TThSa), L forearm AV fistula, [name]  . Secondary hyperparathyroidism of renal origin (HCC)   . Osteopenia 01/2013  . Bronchiectasis (HCC) 08/2014     suggested by thoracic xray  . Anemia of chronic disease   . Severe aortic stenosis     a. echo 10/2014: EF 60-65%, no RWMA, GR1DD, mod to sev AS (peak vel 377 cm/s, mean gradient 34 mm Hg, peak gradient 57 mm Hg, valve area (VTA) 0.72 cm^2  . Chronic respiratory failure (HCC)     a. 2/2 COPD; b. on 4-5L via nasal cannula  . Morbid obesity (HCC)   . GIB (gastrointestinal bleeding)     a. leading to cessation of warfarin 11/2014  . Chronic diastolic CHF (congestive heart failure) (HCC)     a. echo 10/2014: EF 60-65%, no RWMA, GR1DD, severe AS, mild MR, PASP 46 mm Hg  . Pulmonary embolism (HCC) 2009  . Renal insufficiency     Patient has been on dialysis for " four years" -per patient.    Past Surgical History  Procedure Laterality Date  . Cholecystectomy  2012  . Bunionectomy Left 2003  . Replacement total knee Right 2006  .  Shoulder arthroscopy Right 2009  . Colonoscopy  08/2011    colon biopsies, [name]  . Esophagogastroduodenoscopy  08/2011    gastric cardia polyp  . Tonsillectomy  1955  . Tubal ligation  1980  . Exteriorization of a continuous ambulatory peritoneal dialysis catheter  01/2013    removal 12/2103 - [name]  . Hospitalization  12/2013    recurrent R pleural effusion due to peritoneal fluid translocation s/p rpt thoracentesis with 1.3 L fluid removed, ERSD started on HD this hospitalization  . US echocardiography  12/2013    EF  55-60%, nl LV sys fxn, mild-mod MR, AS, increased LV posterior wall thickness, mild TR  . Esophagogastroduodenoscopy Left 11/11/2014    Procedure: ESOPHAGOGASTRODUODENOSCOPY (EGD);  Surgeon: [name], MD;  Location: [facility] ENDOSCOPY;  Service: Endoscopy;  Laterality: Left;  . Peripheral vascular catheterization N/A 11/12/2014    Procedure: IVC Filter Insertion;  Surgeon: [name], MD;  Location: [facility] INVASIVE CV LAB;  Service: Cardiovascular;  Laterality: N/A;  . Cardiac catheterization N/A 01/18/2015    patent coronary arteries without significant osbtruction and preserved LV function, severe aortic stenosis; Procedure: Right/Left Heart Cath and Coronary Angiography;  Surgeon: Tonny Bollman, MD    Family History  Problem Relation Age of Onset  . Deep vein thrombosis Brother   . Cancer Mother 35    colon  . Stroke Mother   . Cancer Father     prostate  . Hypertension Father   . Dementia Father 32  . Hypertension Brother   . Diabetes Neg Hx   . CAD Neg Hx   . Cancer Daughter 77    breast    Social History   Social History  . Marital Status: Married    Spouse Name: N/A  . Number of Children: N/A  . Years of Education: N/A   Occupational History  . Not on file.   Social History Main Topics  . Smoking status: Former Smoker -- 2.00 packs/day for 30 years    Types: Cigarettes    Start date: 04/20/1978    Quit date: 04/21/2007  . Smokeless tobacco: Never Used  . Alcohol Use: No  . Drug Use: No  . Sexual Activity: Not on file   Other Topics Concern  . Not on file   Social History Narrative    Current Outpatient Prescriptions  Medication Sig Dispense Refill  . acetaminophen (TYLENOL) 500 MG tablet Take 1,000 mg by mouth every 6 (six) hours as needed for mild pain or headache.     . albuterol (VENTOLIN HFA) 108 (90 BASE) MCG/ACT inhaler Inhale 2 puffs into the lungs every 4 (four) hours as needed for wheezing or shortness of breath.     . ALPRAZolam (XANAX) 1 MG tablet  Take 1 tablet (1 mg total) by mouth 3 (three) times daily as needed for anxiety. 30 tablet 0  . baclofen (LIORESAL) 10 MG tablet Take 0.5-1 tablets (5-10 mg total) by mouth 2 (two) times daily as needed for muscle spasms. 30 each 0  . budesonide-formoterol (SYMBICORT) 160-4.5 MCG/ACT inhaler Inhale 2 puffs into the lungs 2 (two) times daily.    . calcitRIOL (ROCALTROL) 0.25 MCG capsule Take 0.25 mcg by mouth daily.    . calcium acetate (PHOSLO) 667 MG capsule Take 2,001 mg by mouth 3 (three) times daily with meals.     . citalopram (CELEXA) 20 MG tablet Take 1 tablet (20 mg total) by mouth daily. 90 tablet 3  . cyanocobalamin (,VITAMIN  B-12,) 1000 MCG/ML injection Inject 1,000 mcg into the muscle every 30 (thirty) days.    Marland Kitchen docusate sodium (COLACE) 100 MG capsule Take 1 capsule (100 mg total) by mouth 2 (two) times daily. 10 capsule 0  . donepezil (ARICEPT) 5 MG tablet Take 1 tablet (5 mg total) by mouth at bedtime. 30 tablet 3  . furosemide (LASIX) 20 MG tablet Take 1 tablet (20 mg total) by mouth daily. 30 tablet 3  . lidocaine-prilocaine (EMLA) cream Apply 1 application topically as needed (topical anesthesia for hemodialysis if Gebauers and Lidocaine injection are ineffective.). 30 g 0  . lovastatin (MEVACOR) 20 MG tablet Take 20 mg by mouth at bedtime.    . lovastatin (MEVACOR) 20 MG tablet TAKE ONE TABLET BY MOUTH AT BEDTIME 90 tablet 3  . Multiple Vitamin (MULTIVITAMIN WITH MINERALS) TABS tablet Take 1 tablet by mouth daily. 1 tablet 0  . pantoprazole (PROTONIX) 40 MG tablet Take 40 mg by mouth daily as needed (for heartburn/indigestion).    . promethazine (PHENERGAN) 12.5 MG tablet Take 12.5 mg by mouth every 6 (six) hours as needed for nausea or vomiting.    . traZODone (DESYREL) 50 MG tablet Take 0.5-1 tablets (25-50 mg total) by mouth at bedtime. (Patient taking differently: Take 25 mg by mouth at bedtime. ) 30 tablet 3  . zolpidem (AMBIEN) 5 MG tablet Take 1 tablet (5 mg total) by mouth  at bedtime as needed for sleep. 30 tablet 0  . hydrOXYzine (ATARAX/VISTARIL) 25 MG tablet TAKE ONE-HALF TABLET BY MOUTH TWICE DAILY AS NEEDED 30 tablet 0   No current facility-administered medications for this visit.    Allergies  Allergen Reactions  . Aspirin Other (See Comments)    Pt is unable to take because she has kidney disease.     Gaspar Skeeters [Isometheptene-Dichloral-Apap] Other (See Comments)    Reaction:  Headaches       Review of Systems:  General:normal appetite, decreased energy, no weight gain, no weight loss, no fever Cardiac:+ chest pain with exertion, + chest pain at rest, + SOB with exertion, intermittent resting SOB, no PND, + orthopnea, no palpitations, no arrhythmia, no atrial fibrillation, + LE edema, + dizzy spells, no syncope Respiratory:+ shortness of breath, + home oxygen, intermittent productive cough, + chronic dry cough, no bronchitis, no wheezing, no hemoptysis, no asthma, no pain with inspiration or cough, no sleep apnea, no CPAP at night GI:no difficulty swallowing, no reflux, no frequent heartburn, no hiatal hernia, no abdominal pain, no constipation, no diarrhea, no hematochezia, no hematemesis, + melena ZO:XWRUEAV still makes some urine every day, no dysuria, no frequency, no urinary tract infection, no hematuria, no kidney stones, + kidney disease Vascular:no pain suggestive of claudication, no pain in feet, no leg cramps, no varicose veins, + DVT, no non-healing foot ulcer Neuro:no stroke, no TIA's, no seizures, no headaches, no temporary blindness one eye, no slurred speech, no peripheral neuropathy, no chronic pain, + instability of gait, + memory/cognitive dysfunction Musculoskeletal:+  arthritis, no joint swelling, no myalgias, + difficulty walking, limited mobility  Skin:no rash, no itching, no skin infections, no pressure sores or ulcerations Psych:+ anxiety, + depression, + nervousness, no unusual recent stress Eyes:no blurry vision, no floaters, no recent vision changes, + wears glasses or contacts ENT:no hearing loss, no loose or painful teeth, edentulous with full dentures Hematologic:no easy bruising, no abnormal bleeding, possible clotting disorder, no frequent epistaxis Endocrine:no diabetes, does not check CBG's at home  Physical Exam:   BP 140/86 mmHg  Pulse 86  Resp 20  Ht 5\' 4"  (1.626 m)  Wt 214 lb (97.07 kg)  BMI 36.72 kg/m2  SpO2 97%  General:  Obese, chronically ill-appearing woman wearing oxygen  HEENT:  Unremarkable , NCAT, PERLA, EOMI, edentulous  Neck:   no JVD, no bruits, no adenopathy or thyromegaly  Chest:   clear to auscultation, symmetrical and decreased breath sounds throughout, no wheezes, no rhonchi   CV:   RRR, grade Ill/VI crescendo/decrescendo murmur heard best at RSB,  no diastolic murmur  Abdomen:  soft, non-tender, no masses or organomegaly  Extremities:  warm, well-perfused, pulses not palpable in feet, mild bilateral LE edema  Rectal/GU  Deferred  Neuro:   Grossly non-focal and symmetrical throughout  Skin:   Clean and dry, no rashes, no breakdown   Diagnostic Tests:       *[facility]*           [address]            [place], [place] [address]              [phone]  ------------------------------------------------------------------- Transthoracic Echocardiography  Patient:  Racquel, Arkin MR #:    [id number] Study Date:  11/15/2014 Gender:   F Age:    66 Height:   162.6 cm Weight:   105 kg BSA:    2.23 m^2 Pt. Status: Room:    IC16A  Bryson Ha SONOGRAPHER Cristela Blue RDCS ATTENDING  Sharyn Creamer PERFORMING  [facility] ORDERING   Kolluru, Sarath REFERRING  Kolluru, Sarath  cc:  ------------------------------------------------------------------- LV EF: 60% -  65%  ------------------------------------------------------------------- Indications:   424.1 Aortic valve disorders.  ------------------------------------------------------------------- Study Conclusions  - Left ventricle: The cavity size was normal. Systolic function was normal. The estimated ejection fraction was in the range of 60% to 65%. Wall motion was normal; there were no regional wall motion abnormalities. Doppler parameters are consistent with abnormal left ventricular relaxation (grade 1 diastolic dysfunction). - Aortic valve: Transvalvular velocity was increased. There was moderate to severe stenosis. Peak velocity (S): 377 cm/s. Mean gradient (S): 34 mm Hg. Peak gradient (S): 57 mm Hg. Valve area (VTI): 0.72 cm^2. - Mitral valve: There was mild regurgitation. - Left atrium: The atrium was mildly dilated. - Right ventricle: Systolic function was normal. - Pulmonary arteries: Systolic pressure was mildly elevated. PA peak pressure: 46 mm Hg (S).  Transthoracic echocardiography. M-mode, complete 2D, spectral Doppler, and color Doppler. Birthdate: Patient birthdate: 1946-04-05. Age: Patient is 69 yr old. Sex: Gender: female. BMI: 39.7 kg/m^2. Patient status: Inpatient. Study date: Study date: 11/15/2014. Study time: 12:25 PM.  -------------------------------------------------------------------  ------------------------------------------------------------------- Left ventricle: The cavity size was normal. Systolic function was normal. The  estimated ejection fraction was in the range of 60% to 65%. Wall motion was normal; there were no regional wall motion abnormalities. Doppler parameters are consistent with abnormal left ventricular relaxation (grade 1 diastolic dysfunction).  ------------------------------------------------------------------- Aortic valve:  Trileaflet; normal thickness, mildly calcified leaflets. Mobility was not restricted. Doppler: Transvalvular velocity was increased. There was moderate to severe stenosis. There was no regurgitation.  VTI ratio of LVOT to aortic valve: 0.23. Valve area (VTI): 0.72 cm^2. Indexed valve area (VTI): 0.32 cm^2/m^2. Peak velocity ratio of LVOT to aortic valve: 0.22. Valve area (Vmax): 0.69 cm^2. Indexed valve area (Vmax): 0.31 cm^2/m^2. Mean velocity ratio of LVOT to aortic valve: 0.19. Valve area (Vmean): 0.6 cm^2. Indexed valve area (  Vmean): 0.27 cm^2/m^2. Mean gradient (S): 34 mm Hg. Peak gradient (S): 57 mm Hg.  ------------------------------------------------------------------- Aorta: Aortic root: The aortic root was normal in size.  ------------------------------------------------------------------- Mitral valve:  Structurally normal valve.  Mobility was not restricted. Doppler: Transvalvular velocity was within the normal range. There was no evidence for stenosis. There was mild regurgitation.  Peak gradient (D): 4 mm Hg.  ------------------------------------------------------------------- Left atrium: The atrium was mildly dilated.  ------------------------------------------------------------------- Right ventricle: The cavity size was normal. Wall thickness was normal. Systolic function was normal.  ------------------------------------------------------------------- Pulmonic valve:  Doppler: Transvalvular velocity was within the normal range. There was no evidence for  stenosis.  ------------------------------------------------------------------- Tricuspid valve:  Structurally normal valve.  Doppler: Transvalvular velocity was within the normal range. There was mild regurgitation.  ------------------------------------------------------------------- Pulmonary artery:  The main pulmonary artery was normal-sized. Systolic pressure was mildly elevated.  ------------------------------------------------------------------- Right atrium: The atrium was normal in size.  ------------------------------------------------------------------- Pericardium: There was no pericardial effusion.  ------------------------------------------------------------------- Systemic veins: Inferior vena cava: The vessel was normal in size.  ------------------------------------------------------------------- Measurements  Left ventricle              Value     Reference LV ID, ED, PLAX chordal      (L)   42.9 mm    43 - 52 LV ID, ES, PLAX chordal          28.1 mm    23 - 38 LV fx shortening, PLAX chordal      34  %    >=29 LV PW thickness, ED            11.9 mm    --------- IVS/LV PW ratio, ED            1.04      <=1.3 Stroke volume, 2D             63  ml    --------- Stroke volume/bsa, 2D           28  ml/m^2  --------- LV e&', lateral              10.6 cm/s   --------- LV E/e&', lateral             8.88      --------- LV e&', medial               4.24 cm/s   --------- LV E/e&', medial              22.19     --------- LV e&', average              7.42 cm/s   --------- LV E/e&', average             12.68     ---------  Ventricular septum            Value     Reference IVS thickness, ED             12.4 mm     ---------  LVOT                   Value     Reference LVOT ID, S                20  mm    --------- LVOT area                 3.14 cm^2   --------- LVOT peak velocity, S  82.8 cm/s   --------- LVOT mean velocity, S           51.1 cm/s   --------- LVOT VTI, S                20  cm    ---------  Aortic valve               Value     Reference Aortic valve peak velocity, S       377  cm/s   --------- Aortic valve mean velocity, S       266  cm/s   --------- Aortic valve VTI, S            87  cm    --------- Aortic mean gradient, S          34  mm Hg  --------- Aortic peak gradient, S          57  mm Hg  --------- VTI ratio, LVOT/AV            0.23      --------- Aortic valve area, VTI          0.72 cm^2   --------- Aortic valve area/bsa, VTI        0.32 cm^2/m^2 --------- Velocity ratio, peak, LVOT/AV       0.22      --------- Aortic valve area, peak velocity     0.69 cm^2   --------- Aortic valve area/bsa, peak        0.31 cm^2/m^2 --------- velocity Velocity ratio, mean, LVOT/AV       0.19      --------- Aortic valve area, mean velocity     0.6  cm^2   --------- Aortic valve area/bsa, mean        0.27 cm^2/m^2 --------- velocity  Aorta                   Value     Reference Aortic root ID, ED            24  mm    ---------  Left atrium                Value     Reference LA ID, A-P, ES              45  mm    --------- LA ID/bsa, A-P              2.02 cm/m^2  <=2.2 LA volume, ES, 1-p A4C          68.2 ml    --------- LA volume/bsa, ES, 1-p A4C         30.6 ml/m^2  --------- LA volume, ES, 1-p A2C          56.5 ml    --------- LA volume/bsa, ES, 1-p A2C        25.3 ml/m^2  ---------  Mitral valve               Value     Reference Mitral E-wave peak velocity        94.1 cm/s   --------- Mitral A-wave peak velocity        114  cm/s   --------- Mitral deceleration time         179  ms    150 - 230 Mitral peak gradient, D          4   mm Hg  --------- Mitral E/A ratio,  peak          0.8      ---------  Pulmonary arteries            Value     Reference PA pressure, S, DP        (H)   46  mm Hg  <=30  Right ventricle              Value     Reference RV ID, ED, PLAX              37  mm    19 - 38  Legend: (L) and (H) mark values outside specified reference range.  ------------------------------------------------------------------- Prepared and Electronically Authenticated by  Dossie Arbour, MD, [facility] 2016-07-28T14:58:03     Procedures    Right/Left Heart Cath and Coronary Angiography    Conclusion    1. 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 Normal left ventricular systolic function 3. Calcified aortic valve with hemodynamic findings consistent with severe aortic stenosis  Evaluation by the Multidisciplinary heart valve team for consideration of treatment options in the setting of aortic stenosis and multiple medical comorbidities.    Indications    Severe aortic stenosis [I35.0 (ICD-10-CM)]    Technique and Indications    INDICATION: Severe aortic stenosis  PROCEDURAL DETAILS: The right groin was prepped, draped, and anesthetized with 1% lidocaine. Using the modified Seldinger technique a 5 French sheath was placed in the  right femoral artery and a 6 French sheath was placed in the right femoral vein. A multipurpose catheter was used for the right heart catheterization. Caution was taken in the inferior vena cava because of an indwelling IVC filter. Standard protocol was followed for recording of right heart pressures and sampling of oxygen saturations. Fick cardiac output was calculated. Standard Judkins catheters were used for selective coronary angiography, aortic root angiography, and left ventriculography. There were no immediate procedural complications. The patient was transferred to the post catheterization recovery area for further monitoring.  Estimated blood loss <50 mL.    Coronary Findings    Dominance: Right   Left Anterior Descending  The vessel is angiographically normal.     Left Circumflex  The vessel is angiographically normal. 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 The left circumflex arises from the proximal RCA     Right Coronary Artery  The vessel is angiographically normal. Dominant vessel with no obstructive disease       Right Heart Pressures Hemodynamic findings consistent with aortic stenosis. Elevated LV EDP consistent with volume overload.    Wall Motion                 Left Heart    Left Ventricle The left ventricular size is normal. The left ventricular systolic function is normal. The left ventricular ejection fraction is 55-65% by visual estimate. There are no wall motion abnormalities in the left ventricle.   Aortic Valve There is severe aortic valve stenosis. The aortic valve is calcified. There is restricted aortic valve motion. The aortic valve is crossed with a J-wire without too much difficulty. The transaortic peak to peak gradient is 45 mmHg, the mean gradient is 41 mmHg, the aortic valve area is 1.1 cm. 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, by Fick technique the cardiac output is 9.1 L/m    Coronary Diagrams  Diagnostic Diagram            Implants    Name ID Temporary Type Supply   No information to display    PACS Images    Show images for Cardiac catheterization     Link to Procedure Log    Procedure Log      Hemo Data       Most Recent Value   Fick Cardiac Output  9.12 L/min   Fick Cardiac Output Index  4.54 (L/min)/BSA   Aortic Mean Gradient  40.7 mmHg   Aortic Peak Gradient  45 mmHg   Aortic Valve Area  1.09   Aortic Value Area Index  0.54 cm2/BSA   RA A Wave  10 mmHg   RA V Wave  5 mmHg   RA Mean  6 mmHg   RV Systolic Pressure  37 mmHg   RV Diastolic Pressure  2 mmHg   RV EDP  4 mmHg   PA Systolic Pressure  34 mmHg   PA Diastolic Pressure  9 mmHg   PA Mean  20 mmHg   PW A Wave  16 mmHg   PW V Wave  23 mmHg   PW Mean  14 mmHg   AO Systolic Pressure  124 mmHg   AO Diastolic Pressure  66 mmHg   AO Mean  90 mmHg   LV Systolic Pressure  176 mmHg   LV Diastolic Pressure  13 mmHg   LV EDP  23 mmHg   Arterial Occlusion Pressure Extended Systolic Pressure  134 mmHg   Arterial Occlusion Pressure Extended Diastolic Pressure  71 mmHg   Arterial Occlusion Pressure Extended Mean Pressure  99 mmHg   Left Ventricular Apex Extended Systolic Pressure  179 mmHg   Left Ventricular Apex Extended Diastolic Pressure  14 mmHg   Left Ventricular Apex Extended EDP Pressure  22 mmHg   QP/QS  0.76   TPVR Index  5.81 HRUI   TSVR Index  19.84 HRUI   PVR SVR Ratio  0.09   TPVR/TSVR Ratio  0.29    Order-Level Documents:    There are no order-level documents.    Encounter-Level Documents - 01/15/15:      Scan on 01/22/2015 10:35 AM by Provider Default, MDScan on 01/22/2015 10:35 AM by Provider Default, MD     Scan on 01/22/2015 10:30 AM by Provider Default, MDScan on 01/22/2015 10:30 AM by Provider Default, MD     Scan on 01/18/2015 1:53 PM by Provider Default, MDScan on 01/18/2015 1:53 PM by Provider Default, MD     Electronic  signature on 01/18/2015 9:50 AM    Signed    Electronically signed by Tonny Bollman, MD on 01/18/15 at 1411 EDT    ADDENDUM REPORT: 01/27/2015 15:58 CLINICAL DATA: 69 year old female with severe aortic stenosis EXAM: Cardiac TAVR CT TECHNIQUE: The patient was scanned on a Philips 256 scanner. A 120 kV retrospective scan was triggered in the descending thoracic aorta at 111 HU's. Gantry rotation speed was 270 msecs and collimation was .9 mm. 5 mg of iv Metoprolol and no nitro were given. The 3D data set was reconstructed in 5% intervals of the R-R cycle. Systolic and diastolic phases were analyzed on a dedicated work station using MPR, MIP and VRT modes. The patient received 80 cc of contrast. FINDINGS: Aortic Valve: Severely thickened, moderately calcified trileaflet aortic valve with severely restricted leaflet opening. There is minimal subvalvular calcium in the LVOT. Aorta: Normal caliber, minimal calcifications, no dissection.  Sinotubular Junction: 24 x 23 mm Ascending Thoracic Aorta: 28 x 27 mm Aortic Arch: 23 x 22 mm Descending Thoracic Aorta: Not visualized Sinus of Valsalva Measurements: Non-coronary: 26 mm Right -coronary: 24 mm Left -coronary: 26 mm Coronary Artery Height above Annulus: Left Main: 10 mm Right Coronary: 16 mm Virtual Basal Annulus Measurements: Maximum/Minimum Diameter: 24.4 x 20.4 mm Perimeter: 87 mm Area: 390 mm2 Optimum Fluoroscopic Angle for Delivery: LAO 40 CAU 12 IMPRESSION: 1. Severely thickened, moderately calcified trileaflet aortic valve with severely restricted leaflet opening and annular measurement suitable for delivery of 23 mm Edward-SAPIEN 3 valve. 2. Sufficient annular to coronary distance. 3. Optimum Fluoroscopic Angle for Delivery LAO 40 CAU 12 Tobias Alexander Electronically Signed  By: Tobias Alexander  On: 01/27/2015 15:58     Study Result     EXAM: OVER-READ INTERPRETATION CT CHEST  The  following report is an over-read performed by radiologist Dr. Lesia Hausen [facility] on 01/25/2015. This over-read does not include interpretation of cardiac or coronary anatomy or pathology. The coronary calcium score/coronary CTA interpretation by the cardiologist is attached.  History: Severe aortic stenosis.  COMPARISON: 11/12/2014 unenhanced chest CT.  FINDINGS: Visualized great vessels are normal in course and caliber. No central pulmonary emboli. Coarsely calcified lower right paratracheal lymph nodes, likely from prior granulomatous disease. No pathologically enlarged axillary, mediastinal or hilar lymph nodes in the visualized chest. Visualized thoracic esophagus is normal. No pneumothorax or pleural effusion in the visualized chest. Anterior right upper lobe 4 mm solid pulmonary nodule (series 412/image 25), stable since 08/15/2013. Anterior right lower lobe 3 mm solid pulmonary nodule (412/30), not seen on 08/15/2013 chest CT. Mild hypoventilatory changes are seen in the dependent lungs. There is mosaic attenuation throughout the lungs. There are 2 hypodense subcentimeter lesions in the visualized liver, too small to characterize. No aggressive appearing focal osseous lesion.  IMPRESSION: 1. Two right pulmonary nodules, including a 4 mm right upper lobe pulmonary nodule stable since 08/15/2013 and a 3 mm right lower lobe pulmonary nodule not previously seen. If the patient is at high risk for bronchogenic carcinoma, follow-up chest CT at 1 year is recommended. If the patient is at low risk, no follow-up is needed. This recommendation follows the consensus statement: Guidelines for Management of Small Pulmonary Nodules Detected on CT Scans: A Statement from the Fleischner Society as published in Radiology 2005; 237:395-400. 2. Mosaic attenuation throughout the lungs, a nonspecific finding that could be secondary to air trapping or pulmonary  vascular disease.  Electronically Signed: By: Delbert Phenix M.D. On: 01/25/2015 11:49       CLINICAL DATA: 69 year old female with history of severe aortic stenosis. Preprocedural study prior to potential transcatheter aortic valve replacement (TAVR) procedure.  EXAM: CT ANGIOGRAPHY CHEST, ABDOMEN AND PELVIS  TECHNIQUE: Multidetector CT imaging through the chest, abdomen and pelvis was performed using the standard protocol during bolus administration of intravenous contrast. Multiplanar reconstructed images and MIPs were obtained and reviewed to evaluate the vascular anatomy.  CONTRAST: 70mL OMNIPAQUE IOHEXOL 350 MG/ML SOLN  COMPARISON: Chest CT 11/12/2014.  FINDINGS: CTA CHEST FINDINGS  Mediastinum/Lymph Nodes: Heart size is mildly enlarged. Small amount of anterior pericardial fluid and/or thickening, unlikely to be of any hemodynamic significance at this time. No associated pericardial calcification. There is atherosclerosis of the thoracic aorta, the great vessels of the mediastinum and the coronary arteries, including calcified atherosclerotic plaque in the distal left main, left anterior descending and right coronary arteries. Severe calcifications of the aortic  valve. Calcifications of the mitral annulus and mitral valve. Mild dilatation of the pulmonic trunk (3.2 cm in diameter). No pathologically enlarged mediastinal or hilar lymph nodes. Calcified right peritracheal lymph node incidentally noted. Esophagus is unremarkable in appearance. No axillary lymphadenopathy.  Lungs/Pleura: A few scattered 2-3 mm nodules are noted in the right lung, highly nonspecific. No larger more suspicious appearing pulmonary nodules or masses are otherwise noted. There is a background of mild diffuse ground-glass attenuation and interlobular septal thickening, suggesting mild interstitial pulmonary edema. Some focal thickening of the peribronchovascular interstitium  is noted in the lower lobes of the lungs bilaterally, where there is some mild architectural distortion, favored to reflect chronic post infectious or inflammatory scarring. No acute consolidative airspace disease. No pleural effusions.  Musculoskeletal/Soft Tissues: There are no aggressive appearing lytic or blastic lesions noted in the visualized portions of the skeleton.  CTA ABDOMEN AND PELVIS FINDINGS  Hepatobiliary: A few sub cm low-attenuation lesions are noted in the liver, too small to characterize, but favored to represent tiny cysts. The liver has a slightly shrunken appearance and slightly nodular contour, suggestive of underlying cirrhosis. No hypervascular hepatic lesion noted. Status post cholecystectomy. No intrahepatic biliary ductal dilatation. Common bile duct measures 8 mm in the porta hepatis (within normal limits for post cholecystectomy patient).  Pancreas: No pancreatic mass. Main pancreatic duct is mildly dilated measuring up to 5 mm in the proximal body and 6 mm in the head of the pancreas. No pancreatic or peripancreatic fluid or inflammatory changes.  Spleen: Spleen is borderline enlarged measuring 12.7 x 5.5 x 9.6 cm (estimated splenic volume of 335 mL).  Adrenals/Urinary Tract: Kidneys are severely atrophic bilaterally. Multiple sub cm low-attenuation lesions in the kidneys are too small to definitively characterize, but are favored to represent tiny cysts. No hydroureteronephrosis. Urinary bladder is largely decompressed, but otherwise unremarkable in appearance. Bilateral adrenal glands are normal in appearance.  Stomach/Bowel: The appearance of the stomach is normal. No pathologic dilatation of small bowel or colon. A few scattered colonic diverticulae are noted, without surrounding inflammatory changes to suggest an acute diverticulitis at this time. Normal appendix.  Vascular/Lymphatic: Vascular findings and measurements pertinent  to potential TAVR procedure, as detailed below. No evidence of aneurysm or dissection. Soft tissue stranding adjacent to the right common femoral artery, presumably related to recent vascular access procedure. IVC filter in position with tip terminating shortly beneath the level of the renal veins. No filter associated thrombus noted. No lymphadenopathy noted in the abdomen or pelvis.  Reproductive: Uterus and ovaries are atrophic. A few coarse calcifications in the fundus of the uterus likely represent old calcified fibroids.  Other: No significant volume of ascites. No pneumoperitoneum.  Musculoskeletal: There are no aggressive appearing lytic or blastic lesions noted in the visualized portions of the skeleton.  VASCULAR MEASUREMENTS PERTINENT TO TAVR:  AORTA:  Minimal Aortic Diameter - 15 x 13 mm  Severity of Aortic Calcification - moderate  RIGHT PELVIS:  Right Common Iliac Artery -  Minimal Diameter - 9.4 x 8.1 mm  Tortuosity - Mild  Calcification - Mild  Right External Iliac Artery -  Minimal Diameter - 6.5 x 5.6 mm  Tortuosity - Mild  Calcification - None  Right Common Femoral Artery -  Minimal Diameter - 8.0 x 7.2 mm  Tortuosity - Mild  Calcification - None  LEFT PELVIS:  Left Common Iliac Artery -  Minimal Diameter - 7.4 x 9.0 mm  Tortuosity - Moderate  Calcification - Mild  Left External Iliac Artery -  Minimal Diameter - 6.9 x 6.1 mm  Tortuosity - Moderate  Calcification - None  Left Common Femoral Artery -  Minimal Diameter - 7.4 x 7.6 mm  Tortuosity - Mild  Calcification - None  Review of the MIP images confirms the above findings.  IMPRESSION: 1. Vascular findings and measurements pertinent to potential TAVR procedure, as detailed above. This patient does appear to have suitable pelvic arterial access bilaterally. 2. Mild cardiomegaly with evidence of mild interstitial  pulmonary edema; findings suggestive of mild congestive heart failure. 3. Mild colonic diverticulosis without evidence of acute diverticulitis at this time. 4. Mild dilatation of the pancreatic duct in the head and proximal body of the pancreas. This is nonspecific, but the possibility of indolent neoplasm such as an intraductal papillary mucinous neoplasm is not excluded. Follow-up evaluation with MRI of the abdomen with and without IV gadolinium with MRCP is recommended in 1 year to evaluate the stability of this finding. This recommendation follows ACR consensus guidelines: Managing Incidental Findings on Abdominal CT: White Paper of the ACR Incidental Findings Committee. J Am Coll Radiol 2010;7:754-773. 5. Additional incidental findings, as above.   Electronically Signed  By: Trudie Reed M.D.  On: 01/28/2015 09:37   Impression:  The patient has stage D severe symptomatic aortic stenosis with New York Heart Association functional class IIIB-IV symptoms of fatigue and shortness of breath with minimal exertion and occasionally at rest, consistent with chronic diastolic congestive heart failure.  She has been hospitalized multiple times over the last few months with acute exacerbations of respiratory failure that is likely multifactorial and related to severe AS and chronic diastolic congestive heart failure, morbid obesity, and oxygen-dependent COPD.I have personally reviewed the patient's recent transthoracic echocardiogram, cardiac catheterization, and CT studies. Echocardiogram confirmed the presence of severe aortic stenosis with moderate to severe thickening, calcification, and restricted leaflet mobility involving all 3 leaflets of the patient's aortic valve. The mean gradient is 34 mm by echo and 40 mm by cath. Given the patient's severe physical deconditioning and numerous comorbid medical problems I agree that she is not a candidate for conventional surgical aortic valve  replacement under any circumstances. Options include transcatheter aortic valve replacement or long-term palliative medical therapy.It is possible that elimination of the patient's aortic stenosis could lead to significant improvement in symptoms of congestive heart failure and management of her volume status using hemodialysis.Her CT scans show that she would be a candidate for a 23 mm Sapien 3 valve and her pelvic arterial access appears adequate for a transfemoral approach on either side. I reviewed the procedure of TAVR with her and her husband and answered all of their questions. They would like to proceed with TAVR.  Following the decision to proceed with transcatheter aortic valve replacement, a discussion has been held regarding what types of management strategies would be attempted intraoperatively in the event of life-threatening complications, including whether or not the patient would be considered a candidate for the use of cardiopulmonary bypass and/or conversion to open sternotomy for attempted surgical intervention.  I told her that I did not think that sternotomy for open surgical valve replacement or repair of complications was advisable given her very high operative risk and likely poor outcome. They understand and agree. The patient has been advised of a variety of complications that might develop including but not limited to risks of death, stroke, paravalvular leak, aortic dissection or other major vascular complications, aortic annulus rupture, device embolization, cardiac  rupture or perforation, mitral regurgitation, acute myocardial infarction, arrhythmia, heart block or bradycardia requiring permanent pacemaker placement, congestive heart failure, respiratory failure, renal failure, pneumonia, infection, other late complications related to structural valve deterioration or migration, or other complications that might ultimately cause a temporary or permanent loss of functional  independence or other long term morbidity.  The patient provides full informed consent for the procedure as described and all questions were answered.    Plan:  Transfemoral TAVR on 02/19/2015    Alleen Borne, MD 02/11/2015

## 2015-02-13 NOTE — Telephone Encounter (Signed)
Spoke with patient. She said she had been taking the xanax TID scheduled. She didn't realize it was PRN. She also didn't realize that the hydroxyzine was for anxiety. She said she will stick with the hydroxyzine since she just got that refilled and to disregard the xanax.

## 2015-02-13 NOTE — Telephone Encounter (Signed)
plz call pt to clarify - this is new prescription by me, I prescribed once #30 xanax on 01/28/2015 but prior prescribed by Dr Allena Katz who I believe is her nephrologist How is pt taking - should need rarely, and would be too soon to refill. Prior was taking hydroxyzine for anxiety

## 2015-02-13 NOTE — Telephone Encounter (Signed)
Ok to refill 

## 2015-02-15 ENCOUNTER — Encounter (HOSPITAL_COMMUNITY)
Admission: RE | Admit: 2015-02-15 | Discharge: 2015-02-15 | Disposition: A | Discharge status: Home / Self Care | Payer: Medicare Other | Source: Ambulatory Visit | Attending: Cardiovascular Disease | Admitting: Cardiovascular Disease

## 2015-02-15 ENCOUNTER — Ambulatory Visit (HOSPITAL_COMMUNITY)
Admission: RE | Admit: 2015-02-15 | Discharge: 2015-02-15 | Disposition: A | Discharge status: Home / Self Care | Payer: Medicare Other | Source: Ambulatory Visit | Attending: Cardiovascular Disease | Admitting: Cardiovascular Disease

## 2015-02-15 ENCOUNTER — Telehealth: Payer: Self-pay

## 2015-02-15 ENCOUNTER — Encounter (HOSPITAL_COMMUNITY): Payer: Self-pay

## 2015-02-15 VITALS — BP 118/56 | HR 79 | Temp 98.1°F | Resp 18 | Ht 64.0 in | Wt 213.6 lb

## 2015-02-15 DIAGNOSIS — Z01818 Encounter for other preprocedural examination: Secondary | ICD-10-CM | POA: Diagnosis present

## 2015-02-15 DIAGNOSIS — Z9981 Dependence on supplemental oxygen: Secondary | ICD-10-CM | POA: Diagnosis not present

## 2015-02-15 DIAGNOSIS — I350 Nonrheumatic aortic (valve) stenosis: Secondary | ICD-10-CM | POA: Insufficient documentation

## 2015-02-15 DIAGNOSIS — N2581 Secondary hyperparathyroidism of renal origin: Secondary | ICD-10-CM | POA: Insufficient documentation

## 2015-02-15 DIAGNOSIS — Z86711 Personal history of pulmonary embolism: Secondary | ICD-10-CM | POA: Insufficient documentation

## 2015-02-15 DIAGNOSIS — Z79899 Other long term (current) drug therapy: Secondary | ICD-10-CM | POA: Insufficient documentation

## 2015-02-15 DIAGNOSIS — E785 Hyperlipidemia, unspecified: Secondary | ICD-10-CM | POA: Diagnosis not present

## 2015-02-15 DIAGNOSIS — Z992 Dependence on renal dialysis: Secondary | ICD-10-CM | POA: Diagnosis not present

## 2015-02-15 DIAGNOSIS — J449 Chronic obstructive pulmonary disease, unspecified: Secondary | ICD-10-CM | POA: Diagnosis not present

## 2015-02-15 DIAGNOSIS — Z86718 Personal history of other venous thrombosis and embolism: Secondary | ICD-10-CM | POA: Insufficient documentation

## 2015-02-15 DIAGNOSIS — K219 Gastro-esophageal reflux disease without esophagitis: Secondary | ICD-10-CM | POA: Diagnosis not present

## 2015-02-15 DIAGNOSIS — I5032 Chronic diastolic (congestive) heart failure: Secondary | ICD-10-CM | POA: Insufficient documentation

## 2015-02-15 DIAGNOSIS — Z01812 Encounter for preprocedural laboratory examination: Secondary | ICD-10-CM | POA: Diagnosis not present

## 2015-02-15 DIAGNOSIS — N186 End stage renal disease: Secondary | ICD-10-CM | POA: Insufficient documentation

## 2015-02-15 DIAGNOSIS — I132 Hypertensive heart and chronic kidney disease with heart failure and with stage 5 chronic kidney disease, or end stage renal disease: Secondary | ICD-10-CM | POA: Diagnosis not present

## 2015-02-15 DIAGNOSIS — N390 Urinary tract infection, site not specified: Secondary | ICD-10-CM

## 2015-02-15 DIAGNOSIS — Z87891 Personal history of nicotine dependence: Secondary | ICD-10-CM | POA: Insufficient documentation

## 2015-02-15 LAB — URINALYSIS, ROUTINE W REFLEX MICROSCOPIC
BILIRUBIN URINE: NEGATIVE
GLUCOSE, UA: 100 mg/dL — AB
Ketones, ur: NEGATIVE mg/dL
Nitrite: NEGATIVE
PROTEIN: 100 mg/dL — AB
SPECIFIC GRAVITY, URINE: 1.006 (ref 1.005–1.030)
UROBILINOGEN UA: 0.2 mg/dL (ref 0.0–1.0)
pH: 8.5 — ABNORMAL HIGH (ref 5.0–8.0)

## 2015-02-15 LAB — COMPREHENSIVE METABOLIC PANEL
ALT: 15 U/L (ref 14–54)
ANION GAP: 14 (ref 5–15)
AST: 23 U/L (ref 15–41)
Albumin: 3.6 g/dL (ref 3.5–5.0)
Alkaline Phosphatase: 69 U/L (ref 38–126)
BILIRUBIN TOTAL: 0.5 mg/dL (ref 0.3–1.2)
BUN: 26 mg/dL — ABNORMAL HIGH (ref 6–20)
CO2: 27 mmol/L (ref 22–32)
Calcium: 9.7 mg/dL (ref 8.9–10.3)
Chloride: 95 mmol/L — ABNORMAL LOW (ref 101–111)
Creatinine, Ser: 6.04 mg/dL — ABNORMAL HIGH (ref 0.44–1.00)
GFR calc Af Amer: 7 mL/min — ABNORMAL LOW (ref >60)
GFR, EST NON AFRICAN AMERICAN: 6 mL/min — AB (ref >60)
Glucose, Bld: 140 mg/dL — ABNORMAL HIGH (ref 65–99)
POTASSIUM: 3.9 mmol/L (ref 3.5–5.1)
Sodium: 136 mmol/L (ref 135–145)
TOTAL PROTEIN: 6.6 g/dL (ref 6.5–8.1)

## 2015-02-15 LAB — SURGICAL PCR SCREEN
MRSA, PCR: NEGATIVE
Staphylococcus aureus: NEGATIVE

## 2015-02-15 LAB — CBC
HEMATOCRIT: 31.9 % — AB (ref 36.0–46.0)
Hemoglobin: 10.3 g/dL — ABNORMAL LOW (ref 12.0–15.0)
MCH: 34.2 pg — AB (ref 26.0–34.0)
MCHC: 32.3 g/dL (ref 30.0–36.0)
MCV: 106 fL — AB (ref 78.0–100.0)
Platelets: 197 10*3/uL (ref 150–400)
RBC: 3.01 MIL/uL — ABNORMAL LOW (ref 3.87–5.11)
RDW: 14.3 % (ref 11.5–15.5)
WBC: 6.4 10*3/uL (ref 4.0–10.5)

## 2015-02-15 LAB — URINE MICROSCOPIC-ADD ON

## 2015-02-15 LAB — PROTIME-INR
INR: 1.08 (ref 0.00–1.49)
PROTHROMBIN TIME: 14.2 s (ref 11.6–15.2)

## 2015-02-15 LAB — APTT: aPTT: 33 seconds (ref 24–37)

## 2015-02-15 MED ORDER — CIPROFLOXACIN HCL 500 MG PO TABS: 500.0000 mg | ORAL_TABLET | Freq: Two times a day (BID) | ORAL | Status: DC

## 2015-02-15 NOTE — Progress Notes (Signed)
PCP - Dr. Sharen Hones Cardiologist - [name]  EKG - 02/15/2015 - Epic CXR - 02/15/2015  Echo - 10/2014 - Epic Stress test - denies Cardiac Cath - 12/2014 - Epic  Patient denies shortness of breath and chest pain at PAT appointment.

## 2015-02-15 NOTE — Pre-Procedure Instructions (Signed)
    JEANETTE LINARES  02/15/2015      [facility] [address] [place] - [address]) [place] [address] Phone: [phone] Fax: [phone]    Your procedure is scheduled on Tuesday, November 1st, 2016.  Report to [facility] Admitting at 5:30 A.M.  Call this number if you have problems the morning of surgery:  [phone]   Remember:  Do not eat food or drink liquids after midnight.   Take these medicines the morning of surgery with A SIP OF WATER: Acetaminophen (Tylenol) if needed, Albuterol inhaler if needed (please bring with you), Alprazolam (Xanax) if needed, Baclofen (Lioresal) if needed, Symbicort inhaler, Citalopram (Celexa), Hydroxyzine, Pantoprazole (Protonix), Phenergan if needed.  Stop taking: Aspirin, NSAIDS, Ibuprofen, Advil, Motrin, Aleve, Naproxen, Fish oil, all herbal medications, and all vitamins.    Do not wear jewelry, make-up or nail polish.  Do not wear lotions, powders, or perfumes.  You may NOT wear deodorant.  Do not shave 48 hours prior to surgery.    Do not bring valuables to the hospital.  [facility] is not responsible for any belongings or valuables.  Contacts, dentures or bridgework may not be worn into surgery.  Leave your suitcase in the car.  After surgery it may be brought to your room.  For patients admitted to the hospital, discharge time will be determined by your treatment team.  Patients discharged the day of surgery will not be allowed to drive home.   Special instructions:  See attached.   Please read over the following fact sheets that you were given. Pain Booklet, Coughing and Deep Breathing, Blood Transfusion Information, MRSA Information and Surgical Site Infection Prevention

## 2015-02-15 NOTE — Progress Notes (Signed)
Anesthesia Chart Review: Patient is a 69 year old female scheduled for TAVR, transfemoral approach on 02/19/15 by Dr. Excell Seltzer. She was not felt to be a candidate for conventional AVR due to severe deconditioning and numerous co-morbidities.   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 BMI is consistent with obesity.   PCP is Dr. [name]. Primary cardiologist is Dr. Julien Nordmann. Last out-patient nephrology note I see if from Dr. Mosetta Pigeon.   Meds include albuterol, Xanax, Symbicort, Celexa, Phoslo, calcitriol, Aricept, Lasix, lovastatin, Protonix, Ambien, trazodone, promethazine.  Cardiac cath on 01/18/2015 showed severe AS with a mean gradient of 40 mm Hg, normal LVF, and no significant coronary disease.  Echo on 11/15/14 showed: Normal LV cavity size, LVEF 60-65%, normal wall motion, no regional wall motion abnormalities, grade 1 diastolic dysfunction, mild mitral regurgitation, LA mildly dilated. PA peak pressure 46 mm Hg (S). Moderate to severe aortic stenosis. Peak velocity (S): 377 cm/s. Mean gradient (S): 34 mm Hg. Peak gradient (S): 57 mm Hg. Valve area(VTI): 0.72 cm^2.  Preoperative EKG, Coronary CT, chest CTA, and CXR noted.   02/01/15 PFTs: FVC 2.37 (77%), FEV1 2.01 (86%), DLCO unc 0.17 (0% predicted).  Preoperative labs noted. Patient will need an ISTAT4, T&S (due to transfusion 12/2014), ABG on the day of surgery (unable to get at PAT). TCTS office started patient on Cipro due to UA results.  Velna Ochs [facility] Short Stay Center/Anesthesiology Phone [phone] 02/15/2015 5:58 PM

## 2015-02-15 NOTE — Progress Notes (Signed)
Patient received a blood transfusion in September, 2016.  Notified blood bank and Type and screen will need to be drawn on day of surgery.    IStat 4 will also need to be drawn day of surgery.

## 2015-02-15 NOTE — Progress Notes (Signed)
Ariel Smith made aware of abnormal labs and urine.

## 2015-02-15 NOTE — Telephone Encounter (Signed)
RX for Cipro 500 mg BID x 3 days called to [facility].

## 2015-02-16 LAB — HEMOGLOBIN A1C
HEMOGLOBIN A1C: 5.2 % (ref 4.8–5.6)
Mean Plasma Glucose: 103 mg/dL

## 2015-02-17 LAB — URINE CULTURE

## 2015-02-18 MED ORDER — CHLORHEXIDINE GLUCONATE 0.12 % MT SOLN
15.0000 mL | Freq: Once | OROMUCOSAL | Status: DC
  Filled 2015-02-18: qty 15

## 2015-02-18 MED ORDER — SODIUM CHLORIDE 0.9 % IV SOLN
INTRAVENOUS | Status: DC
  Filled 2015-02-18: qty 30

## 2015-02-18 MED ORDER — CHLORHEXIDINE GLUCONATE 4 % EX LIQD: 60.0000 mL | Freq: Once | CUTANEOUS | Status: DC

## 2015-02-18 MED ORDER — CHLORHEXIDINE GLUCONATE 4 % EX LIQD: 30.0000 mL | CUTANEOUS | Status: DC

## 2015-02-18 MED ORDER — PHENYLEPHRINE HCL 10 MG/ML IJ SOLN
30.0000 ug/min | INTRAVENOUS | Status: DC
  Filled 2015-02-18: qty 2

## 2015-02-18 MED ORDER — VANCOMYCIN HCL 10 G IV SOLR
1500.0000 mg | INTRAVENOUS | Status: DC
  Filled 2015-02-18 (×2): qty 1500

## 2015-02-18 MED ORDER — NITROGLYCERIN IN D5W 200-5 MCG/ML-% IV SOLN
2.0000 ug/min | INTRAVENOUS | Status: DC
Start: 2015-02-18 — End: 2015-02-19
  Filled 2015-02-18: qty 250

## 2015-02-18 MED ORDER — MAGNESIUM SULFATE 50 % IJ SOLN
40.0000 meq | INTRAMUSCULAR | Status: DC
  Filled 2015-02-18: qty 10

## 2015-02-18 MED ORDER — EPINEPHRINE HCL 1 MG/ML IJ SOLN
0.0000 ug/min | INTRAVENOUS | Status: DC
  Filled 2015-02-18: qty 4

## 2015-02-18 MED ORDER — DEXMEDETOMIDINE HCL IN NACL 400 MCG/100ML IV SOLN
0.1000 ug/kg/h | INTRAVENOUS | Status: DC
  Filled 2015-02-18: qty 100

## 2015-02-18 MED ORDER — DOPAMINE-DEXTROSE 3.2-5 MG/ML-% IV SOLN
0.0000 ug/kg/min | INTRAVENOUS | Status: DC
  Filled 2015-02-18: qty 250

## 2015-02-18 MED ORDER — DEXTROSE 5 % IV SOLN
1.5000 g | INTRAVENOUS | Status: DC
  Filled 2015-02-18 (×2): qty 1.5

## 2015-02-18 MED ORDER — NOREPINEPHRINE BITARTRATE 1 MG/ML IV SOLN
0.0000 ug/min | INTRAVENOUS | Status: AC
  Administered 2015-02-19: 2 ug/min via INTRAVENOUS
  Filled 2015-02-18: qty 4

## 2015-02-18 MED ORDER — SODIUM CHLORIDE 0.9 % IV SOLN
INTRAVENOUS | Status: DC
  Filled 2015-02-18: qty 2.5

## 2015-02-18 MED ORDER — POTASSIUM CHLORIDE 2 MEQ/ML IV SOLN
80.0000 meq | INTRAVENOUS | Status: DC
  Filled 2015-02-18: qty 40

## 2015-02-18 MED ORDER — SODIUM CHLORIDE 0.9 % IV SOLN
INTRAVENOUS | Status: DC
  Administered 2015-02-19: 07:00:00 via INTRAVENOUS

## 2015-02-18 NOTE — Anesthesia Preprocedure Evaluation (Addendum)
Anesthesia Evaluation  Patient identified by MRN, date of birth, ID band Patient awake    Reviewed: Allergy & Precautions, Patient's Chart, lab work & pertinent test results  History of Anesthesia Complications Negative for: history of anesthetic complications  Airway Mallampati: I  TM Distance: >3 FB Neck ROM: Full    Dental  (+) Dental Advisory Given, Edentulous Upper, Edentulous Lower   Pulmonary COPD, former smoker,    Pulmonary exam normal        Cardiovascular hypertension, + Valvular Problems/Murmurs AS  Rhythm:Regular Rate:Normal + Systolic murmurs . echo 10/2014: EF 60-65%, no RWMA, GR1DD, mod to sev AS (peak vel 377 cm/s, mean gradient 34 mm Hg, peak gradient 57 mm Hg, valve area (VTA) 0.72 cm^2   Neuro/Psych  Headaches, PSYCHIATRIC DISORDERS Depression    GI/Hepatic Neg liver ROS, GERD  ,  Endo/Other  Morbid obesity  Renal/GU ESRF and DialysisRenal disease     Musculoskeletal   Abdominal   Peds  Hematology   Anesthesia Other Findings   Reproductive/Obstetrics                            Anesthesia Physical Anesthesia Plan  ASA: IV  Anesthesia Plan: General   Post-op Pain Management:    Induction: Intravenous  Airway Management Planned: Oral ETT  Additional Equipment: Arterial line, PA Cath and Ultrasound Guidance Line Placement  Intra-op Plan:   Post-operative Plan: Possible Post-op intubation/ventilation  Informed Consent:   Plan Discussed with:   Anesthesia Plan Comments:         Anesthesia Quick Evaluation

## 2015-02-18 NOTE — H&P (Signed)
[address]       Ariel Smith [address]             [id number]          CARDIOTHORACIC SURGERY HISTORY AND PHYSICAL EXAM  Referring Provider is Mariah Milling, Tollie Pizza, MD PCP is [name], MD  Chief Complaint  Patient presents with  . Aortic Stenosis    Cardiac Cath 01/18/15, ECHO 11/15/14    HPI:  Patient is an obese 69 year old female with history of aortic stenosis, end-stage renal disease on hemodialysis, hypertension, chronic diastolic congestive heart failure, COPD on home oxygen therapy, remote history of DVT and pulmonary embolus status post IVC filter placement, chronic anemia with recent history of GI bleeding, and severe physical deconditioning with limited mobility who has been referred for surgical consultation to discuss treatment options for management of severe symptomatic aortic stenosis. The patient has long-standing history of shortness of breath and chronic respiratory failure that is likely multifactorial and has been attributed to chronic diastolic congestive heart failure and COPD. She has chronic kidney disease attributed to hypertension that became end stage in 2013. She has been on hemodialysis ever since and currently dialyzes every Tuesday, Thursday, Saturday via left forearm AV fistula in the [facility] dialysis clinic. The patient and her husband state that she has been progressively getting worse for several years.   The patient has been hospitalized several times at [facility] over the past 3 months. She initially presented in July with an acute upper GI bleed with hemoglobin 6.4. At the time she was chronically anticoagulated using warfarin and therapeutic with INR 2.7. Upper GI endoscopy revealed gastritis. She developed acute respiratory failure that was felt to be secondary to hospital-acquired pneumonia and acute exacerbation of chronic diastolic congestive heart failure. Warfarin therapy was  discontinued and the patient underwent IVC filter placement. She was in the intensive care unit for approximately 2 weeks and eventually discharged from the hospital 11/22/2014. During that hospitalization she underwent transthoracic echocardiogram demonstrating the presence of severe aortic stenosis with peak velocity across the aortic valve measured 3.8 m/s corresponding to mean transvalvular gradient estimated 34 mmHg. Left ventricular systolic function appeared normal with ejection fraction estimated 60-65%. She has been hospitalized again three times since July with recurrent episodes of acute on chronic respiratory failure with hypoxia attributed to healthcare associated pneumonia and acute exacerbation of chronic diastolic and just of heart failure with fluid overload, most recently from 12/28/2014 to 12/31/2014. Each time symptoms reportedly improved with dialysis therapy to treat volume overload. According to the patient and her husband, her dialysis treatments have been extended to facilitate better fluid removal, and she has been doing a little better over the last few weeks. However, she remains extremely limited by severe exertional shortness of breath and fatigue and she still has intermittent episodes of resting shortness of breath. She was referred to Dr. Excell Seltzer and underwent diagnostic cardiac catheterization on 01/18/2015. Findings were consistent with severe aortic stenosis with mean gradient across the aortic valve measured 40.7 mmHg. Pulmonary artery pressures were only mildly elevated and the patient did not have significant coronary artery disease. The patient was referred for surgical consultation.  The patient is married and lives in [place] with her husband. She has 2 adult daughters and several grandchildren. One of her daughters lives next door. The patient's functional status has been quite limited for several years. She has been obese the majority of her adult life  and has  lived a sedentary lifestyle. She has retired since 2009, having previous and worked in a [employer]. She is limited primarily by severe exertional shortness of breath and generalized weakness with physical deconditioning. She walks using a cane or a walker and can only walk very short distances. The patient has severe chronic exertional shortness of breath that she states first developed approximately 3 years ago and has become dramatically worse recently. She has been on home oxygen therapy for more than 2 years and cannot be off of oxygen for more than a few minutes without developing shortness of breath. She has intermittent resting shortness of breath and she gets short of breath with minimal activity. She has had dizzy spells without syncope. She has some tightness across her chest that seems unrelated to activity. She has some lower extremity edema.          Past Medical History  Diagnosis Date  . Osteoarthritis   . Depression   . Frequent headaches   . HTN (hypertension)   . HLD (hyperlipidemia)   . History of colon polyps 2013    colonoscopy ([name])  . 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 GERD (gastroesophageal reflux disease)   . COPD (chronic obstructive pulmonary disease) (HCC)   . ESRD (end stage renal disease) (HCC) 08/2011    a. on HD (TThSa), L forearm AV fistula, [name]  . Secondary hyperparathyroidism of renal origin (HCC)   . Osteopenia 01/2013  . Bronchiectasis (HCC) 08/2014     suggested by thoracic xray  . Anemia of chronic disease   . Severe aortic stenosis     a. echo 10/2014: EF 60-65%, no RWMA, GR1DD, mod to sev AS (peak vel 377 cm/s, mean gradient 34 mm Hg, peak gradient 57 mm Hg, valve area (VTA) 0.72 cm^2  . Chronic respiratory failure (HCC)     a. 2/2 COPD; b. on 4-5L via nasal cannula  . Morbid obesity (HCC)   . GIB (gastrointestinal bleeding)     a.  leading to cessation of warfarin 11/2014  . Chronic diastolic CHF (congestive heart failure) (HCC)     a. echo 10/2014: EF 60-65%, no RWMA, GR1DD, severe AS, mild MR, PASP 46 mm Hg  . Pulmonary embolism (HCC) 2009  . Renal insufficiency     Patient has been on dialysis for " four years" -per patient.  Marland Kitchen Heart murmur     Past Surgical History  Procedure Laterality Date  . Cholecystectomy  2012  . Bunionectomy Left 2003  . Replacement total knee Left 2006  . Shoulder arthroscopy Right 2009  . Colonoscopy  08/2011    colon biopsies, [name]  . Esophagogastroduodenoscopy  08/2011    gastric cardia polyp  . Tonsillectomy  1955  . Tubal ligation  1980  . Exteriorization of a continuous ambulatory peritoneal dialysis catheter  01/2013    removal 12/2103 - [name]  . Hospitalization  12/2013    recurrent R pleural effusion due to peritoneal fluid translocation s/p rpt thoracentesis with 1.3 L fluid removed, ERSD started on HD this hospitalization  . US echocardiography  12/2013    EF 55-60%, nl LV sys fxn, mild-mod MR, AS, increased LV posterior wall thickness, mild TR  . Esophagogastroduodenoscopy Left 11/11/2014    Procedure: ESOPHAGOGASTRODUODENOSCOPY (EGD);  Surgeon: [name], MD;  Location: [facility] ENDOSCOPY;  Service:  Endoscopy;  Laterality: Left;  . Peripheral vascular catheterization N/A 11/12/2014    Procedure: IVC Filter Insertion;  Surgeon: [name], MD;  Location: [facility] INVASIVE CV LAB;  Service: Cardiovascular;  Laterality: N/A;  . Cardiac catheterization N/A 01/18/2015    patent coronary arteries without significant osbtruction and preserved LV function, severe aortic stenosis; Procedure: Right/Left Heart Cath and Coronary Angiography;  Surgeon: Tonny Bollman, MD    Family History  Problem Relation Age of Onset  . Deep vein thrombosis Brother   . Cancer Mother 95    colon  . Stroke Mother   . Cancer Father     prostate  . Hypertension Father   . Dementia Father 59  .  Hypertension Brother   . Diabetes Neg Hx   . CAD Neg Hx   . Cancer Daughter 60    breast    Social History Social History  Substance Use Topics  . Smoking status: Former Smoker -- 2.00 packs/day for 30 years    Types: Cigarettes    Start date: 04/20/1978    Quit date: 04/21/2007  . Smokeless tobacco: Never Used  . Alcohol Use: No    Prior to Admission medications   Medication Sig Start Date End Date Taking? Authorizing Provider  acetaminophen (TYLENOL) 500 MG tablet Take 1,000 mg by mouth every 6 (six) hours as needed for mild pain or headache.    Yes Historical Provider, MD  albuterol (VENTOLIN HFA) 108 (90 BASE) MCG/ACT inhaler Inhale 2 puffs into the lungs every 4 (four) hours as needed for wheezing or shortness of breath.    Yes Historical Provider, MD  ALPRAZolam Prudy Feeler) 1 MG tablet Take 1 tablet (1 mg total) by mouth 3 (three) times daily as needed for anxiety. 01/28/15  Yes [name], MD  baclofen (LIORESAL) 10 MG tablet Take 0.5-1 tablets (5-10 mg total) by mouth 2 (two) times daily as needed for muscle spasms. 01/28/15  Yes [name], MD  budesonide-formoterol [facility] 160-4.5 MCG/ACT inhaler Inhale 2 puffs into the lungs 2 (two) times daily.   Yes Historical Provider, MD  calcitRIOL (ROCALTROL) 0.25 MCG capsule Take 0.25 mcg by mouth daily.   Yes Historical Provider, MD  calcium acetate (PHOSLO) 667 MG capsule Take 667-2,001 mg by mouth 3 (three) times daily with meals. Take 3 with meals and 1 with a snack   Yes Historical Provider, MD  citalopram (CELEXA) 20 MG tablet Take 1 tablet (20 mg total) by mouth daily. 01/07/15  Yes [name], MD  cyanocobalamin (,VITAMIN B-12,) 1000 MCG/ML injection Inject 1,000 mcg into the muscle every 30 (thirty) days.   Yes Historical Provider, MD  docusate sodium (COLACE) 100 MG capsule Take 1 capsule (100 mg total) by mouth 2 (two) times daily. Patient taking differently: Take 100 mg by mouth 2 (two) times daily as  needed for mild constipation.  11/22/14  Yes Auburn Bilberry, MD  donepezil (ARICEPT) 5 MG tablet Take 1 tablet (5 mg total) by mouth at bedtime. 11/01/14  Yes [name], MD  furosemide (LASIX) 20 MG tablet Take 1 tablet (20 mg total) by mouth daily. 01/08/15  Yes [name], MD  hydrOXYzine (ATARAX/VISTARIL) 25 MG tablet TAKE ONE-HALF TABLET BY MOUTH TWICE DAILY AS NEEDED Patient taking differently: TAKE ONE-HALF TABLET BY MOUTH TWICE DAILY 02/11/15  Yes [name], MD  lidocaine-prilocaine (EMLA) cream Apply 1 application topically as needed (topical anesthesia for hemodialysis if Gebauers and Lidocaine injection are ineffective.). 11/22/14  Yes Auburn Bilberry, MD  lovastatin (MEVACOR) 20 MG tablet TAKE ONE TABLET BY MOUTH AT BEDTIME 02/11/15  Yes [name], MD  Multiple Vitamin (MULTIVITAMIN WITH MINERALS) TABS tablet Take 1 tablet by mouth daily. 11/22/14  Yes Auburn Bilberry, MD  pantoprazole (PROTONIX) 40 MG tablet Take 40 mg by mouth daily as needed (for heartburn/indigestion).   Yes Historical Provider, MD  promethazine (PHENERGAN) 12.5 MG tablet Take 12.5 mg by mouth every 6 (six) hours as needed for nausea or vomiting.   Yes Historical Provider, MD  traZODone (DESYREL) 50 MG tablet Take 0.5-1 tablets (25-50 mg total) by mouth at bedtime. Patient taking differently: Take 25 mg by mouth at bedtime.  11/01/14  Yes [name], MD  zolpidem (AMBIEN) 5 MG tablet Take 1 tablet (5 mg total) by mouth at bedtime as needed for sleep. 01/14/15  Yes [name], MD  ciprofloxacin (CIPRO) 500 MG tablet Take 1 tablet (500 mg total) by mouth 2 (two) times daily. 02/15/15   Purcell Nails, MD    Allergies  Allergen Reactions  . Aspirin Other (See Comments)    Pt is unable to take because she has kidney disease.     Gaspar Skeeters [Isometheptene-Dichloral-Apap] Other (See Comments)    Reaction:  Headaches       Review of  Systems:  General:normal appetite, decreased energy, no weight gain, no weight loss, no fever Cardiac:+ chest pain with exertion, + chest pain at rest, + SOB with exertion, intermittent resting SOB, no PND, + orthopnea, no palpitations, no arrhythmia, no atrial fibrillation, + LE edema, + dizzy spells, no syncope Respiratory:+ shortness of breath, + home oxygen, intermittent productive cough, + chronic dry cough, no bronchitis, no wheezing, no hemoptysis, no asthma, no pain with inspiration or cough, no sleep apnea, no CPAP at night GI:no difficulty swallowing, no reflux, no frequent heartburn, no hiatal hernia, no abdominal pain, no constipation, no diarrhea, no hematochezia, no hematemesis, + melena GN:FAOZHYQ still makes some urine every day, no dysuria, no frequency, no urinary tract infection, no hematuria, no kidney stones, + kidney disease Vascular:no pain suggestive of claudication, no pain in feet, no leg cramps, no varicose veins, + DVT, no non-healing foot ulcer Neuro:no stroke, no TIA's, no seizures, no headaches, no temporary blindness one eye, no slurred speech, no peripheral neuropathy, no chronic pain, + instability of gait, + memory/cognitive dysfunction Musculoskeletal:+ arthritis, no joint swelling, no myalgias, + difficulty walking, limited mobility  Skin:no rash, no itching, no skin infections, no pressure sores or ulcerations Psych:+ anxiety, + depression, + nervousness, no unusual recent stress Eyes:no blurry vision, no floaters, no recent vision changes, + wears glasses or  contacts ENT:no hearing loss, no loose or painful teeth, edentulous with full dentures Hematologic:no easy bruising, no abnormal bleeding, possible clotting disorder, no frequent epistaxis Endocrine:no diabetes, does not check CBG's at home       Physical Exam:  BP 106/64 mmHg  Pulse 88  Resp 20  Ht 5\' 4"  (1.626 m)  Wt 213 lb (96.616 kg)  BMI 36.54 kg/m2  SpO2 95% General:Obese and debilitated-appearing HEENT:Unremarkable  Neck:no JVD, no bruits, no adenopathy  Chest:clear to auscultation, symmetrical breath sounds, no wheezes, no rhonchi  CV:RRR, grade III/VI crescendo/decrescendo murmur heard best at RSB, no diastolic murmur Abdomen:Very obese, soft, non-tender, no masses  Extremities:warm, well-perfused, pulses not palpable, + mild bilateral LE edema Rectal/GUDeferred Neuro:Grossly non-focal and symmetrical throughout Skin:Clean and dry, no rashes, no breakdown   Diagnostic Tests:  Transthoracic Echocardiography  Patient:  Ariel Smith, Ariel Smith  P MR #:    [id number] Study Date: 11/15/2014 Gender:   F Age:    77 Height:   162.6 cm Weight:   105 kg BSA:    2.23 m^2 Pt. Status: Room:    IC16A  Bryson Ha SONOGRAPHER Cristela Blue RDCS ATTENDING  Sharyn Creamer PERFORMING  [facility] ORDERING   Kolluru, Sarath REFERRING  Kolluru, Sarath  cc:  ------------------------------------------------------------------- LV EF:  60% -  65%  ------------------------------------------------------------------- Indications:   424.1 Aortic valve disorders.  ------------------------------------------------------------------- Study Conclusions  - Left ventricle: The cavity size was normal. Systolic function was normal. The estimated ejection fraction was in the range of 60% to 65%. Wall motion was normal; there were no regional wall motion abnormalities. Doppler parameters are consistent with abnormal left ventricular relaxation (grade 1 diastolic dysfunction). - Aortic valve: Transvalvular velocity was increased. There was moderate to severe stenosis. Peak velocity (S): 377 cm/s. Mean gradient (S): 34 mm Hg. Peak gradient (S): 57 mm Hg. Valve area (VTI): 0.72 cm^2. - Mitral valve: There was mild regurgitation. - Left atrium: The atrium was mildly dilated. - Right ventricle: Systolic function was normal. - Pulmonary arteries: Systolic pressure was mildly elevated. PA peak pressure: 46 mm Hg (S).  Transthoracic echocardiography. M-mode, complete 2D, spectral Doppler, and color Doppler. Birthdate: Patient birthdate: 11-29-1945. Age: Patient is 69 yr old. Sex: Gender: female. BMI: 39.7 kg/m^2. Patient status: Inpatient. Study date: Study date: 11/15/2014. Study time: 12:25 PM.  -------------------------------------------------------------------  ------------------------------------------------------------------- Left ventricle: The cavity size was normal. Systolic function was normal. The estimated ejection fraction was in the range of 60% to 65%. Wall motion was normal; there were no regional wall motion abnormalities. Doppler parameters are consistent with abnormal left ventricular relaxation (grade 1 diastolic dysfunction).  ------------------------------------------------------------------- Aortic valve:  Trileaflet; normal thickness, mildly calcified leaflets. Mobility  was not restricted. Doppler: Transvalvular velocity was increased. There was moderate to severe stenosis. There was no regurgitation.  VTI ratio of LVOT to aortic valve: 0.23. Valve area (VTI): 0.72 cm^2. Indexed valve area (VTI): 0.32 cm^2/m^2. Peak velocity ratio of LVOT to aortic valve: 0.22. Valve area (Vmax): 0.69 cm^2. Indexed valve area (Vmax): 0.31 cm^2/m^2. Mean velocity ratio of LVOT to aortic valve: 0.19. Valve area (Vmean): 0.6 cm^2. Indexed valve area (Vmean): 0.27 cm^2/m^2. Mean gradient (S): 34 mm Hg. Peak gradient (S): 57 mm Hg.  ------------------------------------------------------------------- Aorta: Aortic root: The aortic root was normal in size.  ------------------------------------------------------------------- Mitral valve:  Structurally normal valve.  Mobility was not restricted. Doppler: Transvalvular velocity was within the normal range. There was no evidence for stenosis. There was mild regurgitation.  Peak gradient (D): 4 mm Hg.  ------------------------------------------------------------------- Left atrium: The atrium was mildly dilated.  ------------------------------------------------------------------- Right ventricle: The cavity size was normal. Wall thickness was normal. Systolic function was normal.  ------------------------------------------------------------------- Pulmonic valve:  Doppler: Transvalvular velocity was within the normal range. There was no evidence for stenosis.  ------------------------------------------------------------------- Tricuspid valve:  Structurally normal valve.  Doppler: Transvalvular velocity was within the normal range. There was mild regurgitation.  ------------------------------------------------------------------- Pulmonary artery:  The main pulmonary artery was normal-sized. Systolic pressure was mildly  elevated.  ------------------------------------------------------------------- Right atrium: The atrium was normal in size.  ------------------------------------------------------------------- Pericardium: There was no pericardial effusion.  ------------------------------------------------------------------- Systemic veins: Inferior vena cava: The vessel was normal in size.  ------------------------------------------------------------------- Measurements  Left ventricle              Value     Reference LV ID, ED, PLAX chordal      (  L)   42.9 mm    43 - 52 LV ID, ES, PLAX chordal          28.1 mm    23 - 38 LV fx shortening, PLAX chordal      34  %    >=29 LV PW thickness, ED            11.9 mm    --------- IVS/LV PW ratio, ED            1.04      <=1.3 Stroke volume, 2D             63  ml    --------- Stroke volume/bsa, 2D           28  ml/m^2  --------- LV e&', lateral              10.6 cm/s   --------- LV E/e&', lateral             8.88      --------- LV e&', medial               4.24 cm/s   --------- LV E/e&', medial              22.19     --------- LV e&', average              7.42 cm/s   --------- LV E/e&', average             12.68     ---------  Ventricular septum            Value     Reference IVS thickness, ED             12.4 mm    ---------  LVOT                   Value     Reference LVOT ID, S                20  mm    --------- LVOT area                 3.14 cm^2   --------- LVOT peak velocity, S           82.8 cm/s   --------- LVOT mean velocity, S           51.1 cm/s   --------- LVOT VTI, S                 20  cm    ---------  Aortic valve               Value     Reference Aortic valve peak velocity, S       377  cm/s   --------- Aortic valve mean velocity, S       266  cm/s   --------- Aortic valve VTI, S            87  cm    --------- Aortic mean gradient, S          34  mm Hg  --------- Aortic peak gradient, S          57  mm Hg  --------- VTI ratio, LVOT/AV            0.23      --------- Aortic valve area, VTI          0.72 cm^2   --------- Aortic valve area/bsa,  VTI        0.32 cm^2/m^2 --------- Velocity ratio, peak, LVOT/AV       0.22      --------- Aortic valve area, peak velocity     0.69 cm^2   --------- Aortic valve area/bsa, peak        0.31 cm^2/m^2 --------- velocity Velocity ratio, mean, LVOT/AV       0.19      --------- Aortic valve area, mean velocity     0.6  cm^2   --------- Aortic valve area/bsa, mean        0.27 cm^2/m^2 --------- velocity  Aorta                   Value     Reference Aortic root ID, ED            24  mm    ---------  Left atrium                Value     Reference LA ID, A-P, ES              45  mm    --------- LA ID/bsa, A-P              2.02 cm/m^2  <=2.2 LA volume, ES, 1-p A4C          68.2 ml    --------- LA volume/bsa, ES, 1-p A4C        30.6 ml/m^2  --------- LA volume, ES, 1-p A2C          56.5 ml    --------- LA volume/bsa, ES, 1-p A2C        25.3 ml/m^2  ---------  Mitral valve               Value     Reference Mitral E-wave peak velocity        94.1 cm/s   --------- Mitral A-wave peak velocity        114  cm/s   --------- Mitral  deceleration time         179  ms    150 - 230 Mitral peak gradient, D          4   mm Hg  --------- Mitral E/A ratio, peak          0.8      ---------  Pulmonary arteries            Value     Reference PA pressure, S, DP        (H)   46  mm Hg  <=30  Right ventricle              Value     Reference RV ID, ED, PLAX              37  mm    19 - 38  Legend: (L) and (H) mark values outside specified reference range.  ------------------------------------------------------------------- Prepared and Electronically Authenticated by  Dossie Arbour, MD, [facility] 2016-07-28T14:58:03    CARDIAC CATHETERIZATION  Procedures    Right/Left Heart Cath and Coronary Angiography    Conclusion    1. 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 Normal left ventricular systolic function 3. Calcified aortic valve with hemodynamic findings consistent with severe aortic stenosis  Evaluation by the Multidisciplinary heart valve team for consideration of treatment options in the setting of  aortic stenosis and multiple medical comorbidities.    Indications    Severe aortic stenosis [I35.0 (ICD-10-CM)]    Technique and Indications    INDICATION: Severe aortic stenosis  PROCEDURAL DETAILS: The right groin was prepped, draped, and anesthetized with 1% lidocaine. Using the modified Seldinger technique a 5 French sheath was placed in the right femoral artery and a 6 French sheath was placed in the right femoral vein. A multipurpose catheter was used for the right heart catheterization. Caution was taken in the inferior vena cava because of an indwelling IVC filter. Standard protocol was followed for recording of right heart pressures and sampling of oxygen saturations. Fick  cardiac output was calculated. Standard Judkins catheters were used for selective coronary angiography, aortic root angiography, and left ventriculography. There were no immediate procedural complications. The patient was transferred to the post catheterization recovery area for further monitoring.  Estimated blood loss <50 mL.    Coronary Findings    Dominance: Right   Left Anterior Descending  The vessel is angiographically normal.     Left Circumflex  The vessel is angiographically normal. 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 The left circumflex arises from the proximal RCA     Right Coronary Artery  The vessel is angiographically normal. Dominant vessel with no obstructive disease       Right Heart Pressures Hemodynamic findings consistent with aortic stenosis. Elevated LV EDP consistent with volume overload.    Wall Motion                 Left Heart    Left Ventricle The left ventricular size is normal. The left ventricular systolic function is normal. The left ventricular ejection fraction is 55-65% by visual estimate. There are no wall motion abnormalities in the left ventricle.   Aortic Valve There is severe aortic valve stenosis. The aortic valve is calcified. There is restricted aortic valve motion. The aortic valve is crossed with a J-wire without too much difficulty. The transaortic peak to peak gradient is 45 mmHg, the mean gradient is 41 mmHg, the aortic valve area is 1.1 cm. 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, by Fick technique the cardiac output is 9.1 L/m    Coronary Diagrams    Diagnostic Diagram            Implants    Name ID Temporary Type Supply   No information to display    PACS Images    Show images for Cardiac catheterization     Link to Procedure Log    Procedure Log      Hemo Data       Most Recent Value    Fick Cardiac Output  9.12 L/min   Fick Cardiac Output Index  4.54 (L/min)/BSA   Aortic Mean Gradient  40.7 mmHg   Aortic Peak Gradient  45 mmHg   Aortic Valve Area  1.09   Aortic Value Area Index  0.54 cm2/BSA   RA A Wave  10 mmHg   RA V Wave  5 mmHg   RA Mean  6 mmHg   RV Systolic Pressure  37 mmHg   RV Diastolic Pressure  2 mmHg   RV EDP  4 mmHg   PA Systolic Pressure  34 mmHg   PA Diastolic Pressure  9 mmHg   PA Mean  20 mmHg   PW A Wave  16 mmHg   PW V Wave  23 mmHg   PW  Mean  14 mmHg   AO Systolic Pressure  124 mmHg   AO Diastolic Pressure  66 mmHg   AO Mean  90 mmHg   LV Systolic Pressure  176 mmHg   LV Diastolic Pressure  13 mmHg   LV EDP  23 mmHg   Arterial Occlusion Pressure Extended Systolic Pressure  134 mmHg   Arterial Occlusion Pressure Extended Diastolic Pressure  71 mmHg   Arterial Occlusion Pressure Extended Mean Pressure  99 mmHg   Left Ventricular Apex Extended Systolic Pressure  179 mmHg   Left Ventricular Apex Extended Diastolic Pressure  14 mmHg   Left Ventricular Apex Extended EDP Pressure  22 mmHg   QP/QS  0.76   TPVR Index  5.81 HRUI   TSVR Index  19.84 HRUI   PVR SVR Ratio  0.09   TPVR/TSVR Ratio  0.29    STS Risk Calculator  ProcedureAVR  Risk of Mortality14.3% Morbidity or Mortality50.2% Prolonged LOS32.9% Short LOS6.2% Permanent Stroke2.8% Prolonged Vent Support45.4% DSW Infection1.7% Renal Failuren/a% Reoperation17.4%  Cardiac  TAVR CT TECHNIQUE: The patient was scanned on a Philips 256 scanner. A 120 kV retrospective scan was triggered in the descending thoracic aorta at 111 HU's. Gantry rotation speed was 270 msecs and collimation was .9 mm. 5 mg of iv Metoprolol and no nitro were given. The 3D data set was reconstructed in 5% intervals of the R-R cycle. Systolic and diastolic phases were analyzed on a dedicated work station using MPR, MIP and VRT modes. The patient received 80 cc of contrast. FINDINGS: Aortic Valve: Severely thickened, moderately calcified trileaflet aortic valve with severely restricted leaflet opening. There is minimal subvalvular calcium in the LVOT. Aorta: Normal caliber, minimal calcifications, no dissection. Sinotubular Junction: 24 x 23 mm Ascending Thoracic Aorta: 28 x 27 mm Aortic Arch: 23 x 22 mm Descending Thoracic Aorta: Not visualized Sinus of Valsalva Measurements: Non-coronary: 26 mm Right -coronary: 24 mm Left -coronary: 26 mm Coronary Artery Height above Annulus: Left Main: 10 mm Right Coronary: 16 mm Virtual Basal Annulus Measurements: Maximum/Minimum Diameter: 24.4 x 20.4 mm Perimeter: 87 mm Area: 390 mm2 Optimum Fluoroscopic Angle for Delivery: LAO 40 CAU 12 IMPRESSION: 1. Severely thickened, moderately calcified trileaflet aortic valve with severely restricted leaflet opening and annular measurement suitable for delivery of 23 mm Edward-SAPIEN 3 valve. 2. Sufficient annular to coronary distance. 3. Optimum Fluoroscopic Angle for Delivery LAO 40 CAU 12 Tobias Alexander Electronically Signed  By: Tobias Alexander  On: 01/27/2015 15:58     Study Result     EXAM: OVER-READ INTERPRETATION CT CHEST  The following report is an over-read performed by radiologist Dr. Lesia Hausen [facility] on 01/25/2015. This over-read does not include interpretation of cardiac or coronary anatomy or pathology. The coronary calcium  score/coronary CTA interpretation by the cardiologist is attached.  History: Severe aortic stenosis.  COMPARISON: 11/12/2014 unenhanced chest CT.  FINDINGS: Visualized great vessels are normal in course and caliber. No central pulmonary emboli. Coarsely calcified lower right paratracheal lymph nodes, likely from prior granulomatous disease. No pathologically enlarged axillary, mediastinal or hilar lymph nodes in the visualized chest. Visualized thoracic esophagus is normal. No pneumothorax or pleural effusion in the visualized chest. Anterior right upper lobe 4 mm solid pulmonary nodule (series 412/image 25), stable since 08/15/2013. Anterior right lower lobe 3 mm solid pulmonary nodule (412/30), not seen on 08/15/2013 chest CT. Mild hypoventilatory changes are seen in the dependent lungs. There is mosaic attenuation throughout the lungs. There are 2 hypodense  subcentimeter lesions in the visualized liver, too small to characterize. No aggressive appearing focal osseous lesion.  IMPRESSION: 1. Two right pulmonary nodules, including a 4 mm right upper lobe pulmonary nodule stable since 08/15/2013 and a 3 mm right lower lobe pulmonary nodule not previously seen. If the patient is at high risk for bronchogenic carcinoma, follow-up chest CT at 1 year is recommended. If the patient is at low risk, no follow-up is needed. This recommendation follows the consensus statement: Guidelines for Management of Small Pulmonary Nodules Detected on CT Scans: A Statement from the Fleischner Society as published in Radiology 2005; 237:395-400. 2. Mosaic attenuation throughout the lungs, a nonspecific finding that could be secondary to air trapping or pulmonary vascular disease.  Electronically Signed: By: Delbert Phenix M.D. On: 01/25/2015 11:49       CLINICAL DATA: 69 year old female with history of severe aortic stenosis. Preprocedural study prior to potential  transcatheter aortic valve replacement (TAVR) procedure.  EXAM: CT ANGIOGRAPHY CHEST, ABDOMEN AND PELVIS  TECHNIQUE: Multidetector CT imaging through the chest, abdomen and pelvis was performed using the standard protocol during bolus administration of intravenous contrast. Multiplanar reconstructed images and MIPs were obtained and reviewed to evaluate the vascular anatomy.  CONTRAST: 70mL OMNIPAQUE IOHEXOL 350 MG/ML SOLN  COMPARISON: Chest CT 11/12/2014.  FINDINGS: CTA CHEST FINDINGS  Mediastinum/Lymph Nodes: Heart size is mildly enlarged. Small amount of anterior pericardial fluid and/or thickening, unlikely to be of any hemodynamic significance at this time. No associated pericardial calcification. There is atherosclerosis of the thoracic aorta, the great vessels of the mediastinum and the coronary arteries, including calcified atherosclerotic plaque in the distal left main, left anterior descending and right coronary arteries. Severe calcifications of the aortic valve. Calcifications of the mitral annulus and mitral valve. Mild dilatation of the pulmonic trunk (3.2 cm in diameter). No pathologically enlarged mediastinal or hilar lymph nodes. Calcified right peritracheal lymph node incidentally noted. Esophagus is unremarkable in appearance. No axillary lymphadenopathy.  Lungs/Pleura: A few scattered 2-3 mm nodules are noted in the right lung, highly nonspecific. No larger more suspicious appearing pulmonary nodules or masses are otherwise noted. There is a background of mild diffuse ground-glass attenuation and interlobular septal thickening, suggesting mild interstitial pulmonary edema. Some focal thickening of the peribronchovascular interstitium is noted in the lower lobes of the lungs bilaterally, where there is some mild architectural distortion, favored to reflect chronic post infectious or inflammatory scarring. No acute consolidative airspace disease.  No pleural effusions.  Musculoskeletal/Soft Tissues: There are no aggressive appearing lytic or blastic lesions noted in the visualized portions of the skeleton.  CTA ABDOMEN AND PELVIS FINDINGS  Hepatobiliary: A few sub cm low-attenuation lesions are noted in the liver, too small to characterize, but favored to represent tiny cysts. The liver has a slightly shrunken appearance and slightly nodular contour, suggestive of underlying cirrhosis. No hypervascular hepatic lesion noted. Status post cholecystectomy. No intrahepatic biliary ductal dilatation. Common bile duct measures 8 mm in the porta hepatis (within normal limits for post cholecystectomy patient).  Pancreas: No pancreatic mass. Main pancreatic duct is mildly dilated measuring up to 5 mm in the proximal body and 6 mm in the head of the pancreas. No pancreatic or peripancreatic fluid or inflammatory changes.  Spleen: Spleen is borderline enlarged measuring 12.7 x 5.5 x 9.6 cm (estimated splenic volume of 335 mL).  Adrenals/Urinary Tract: Kidneys are severely atrophic bilaterally. Multiple sub cm low-attenuation lesions in the kidneys are too small to definitively characterize, but are favored  to represent tiny cysts. No hydroureteronephrosis. Urinary bladder is largely decompressed, but otherwise unremarkable in appearance. Bilateral adrenal glands are normal in appearance.  Stomach/Bowel: The appearance of the stomach is normal. No pathologic dilatation of small bowel or colon. A few scattered colonic diverticulae are noted, without surrounding inflammatory changes to suggest an acute diverticulitis at this time. Normal appendix.  Vascular/Lymphatic: Vascular findings and measurements pertinent to potential TAVR procedure, as detailed below. No evidence of aneurysm or dissection. Soft tissue stranding adjacent to the right common femoral artery, presumably related to recent vascular access procedure. IVC  filter in position with tip terminating shortly beneath the level of the renal veins. No filter associated thrombus noted. No lymphadenopathy noted in the abdomen or pelvis.  Reproductive: Uterus and ovaries are atrophic. A few coarse calcifications in the fundus of the uterus likely represent old calcified fibroids.  Other: No significant volume of ascites. No pneumoperitoneum.  Musculoskeletal: There are no aggressive appearing lytic or blastic lesions noted in the visualized portions of the skeleton.  VASCULAR MEASUREMENTS PERTINENT TO TAVR:  AORTA:  Minimal Aortic Diameter - 15 x 13 mm  Severity of Aortic Calcification - moderate  RIGHT PELVIS:  Right Common Iliac Artery -  Minimal Diameter - 9.4 x 8.1 mm  Tortuosity - Mild  Calcification - Mild  Right External Iliac Artery -  Minimal Diameter - 6.5 x 5.6 mm  Tortuosity - Mild  Calcification - None  Right Common Femoral Artery -  Minimal Diameter - 8.0 x 7.2 mm  Tortuosity - Mild  Calcification - None  LEFT PELVIS:  Left Common Iliac Artery -  Minimal Diameter - 7.4 x 9.0 mm  Tortuosity - Moderate  Calcification - Mild  Left External Iliac Artery -  Minimal Diameter - 6.9 x 6.1 mm  Tortuosity - Moderate  Calcification - None  Left Common Femoral Artery -  Minimal Diameter - 7.4 x 7.6 mm  Tortuosity - Mild  Calcification - None  Review of the MIP images confirms the above findings.  IMPRESSION: 1. Vascular findings and measurements pertinent to potential TAVR procedure, as detailed above. This patient does appear to have suitable pelvic arterial access bilaterally. 2. Mild cardiomegaly with evidence of mild interstitial pulmonary edema; findings suggestive of mild congestive heart failure. 3. Mild colonic diverticulosis without evidence of acute diverticulitis at this time. 4. Mild dilatation of the pancreatic duct in the head and  proximal body of the pancreas. This is nonspecific, but the possibility of indolent neoplasm such as an intraductal papillary mucinous neoplasm is not excluded. Follow-up evaluation with MRI of the abdomen with and without IV gadolinium with MRCP is recommended in 1 year to evaluate the stability of this finding. This recommendation follows ACR consensus guidelines: Managing Incidental Findings on Abdominal CT: White Paper of the ACR Incidental Findings Committee. J Am Coll Radiol 2010;7:754-773. 5. Additional incidental findings, as above.   Electronically Signed  By: Trudie Reed M.D.  On: 01/28/2015 09:37                 Impression:  The patient has stage D severe symptomatic aortic stenosis with New [place] Heart Association functional class IIIB-IV symptoms of fatigue and shortness of breath with minimal exertion and occasionally at rest, consistent with chronic diastolic congestive heart failure. She has been hospitalized multiple times over the last few months with acute exacerbations of respiratory failure that is likely multifactorial and related to severe AS and chronic diastolic congestive heart failure, morbid obesity,  and oxygen-dependent COPD.I have personally reviewed the patient's recent transthoracic echocardiogram, cardiac catheterization, and CT studies. Echocardiogram confirmed the presence of severe aortic stenosis with moderate to severe thickening, calcification, and restricted leaflet mobility involving all 3 leaflets of the patient's aortic valve. The mean gradient is 34 mm by echo and 40 mm by cath. Given the patient's severe physical deconditioning and numerous comorbid medical problems I agree that she is not a candidate for conventional surgical aortic valve replacement under any circumstances. Options include transcatheter aortic valve replacement or long-term palliative medical therapy.It is possible that elimination of the patient's aortic stenosis  could lead to significant improvement in symptoms of congestive heart failure and management of her volume status using hemodialysis.Her CT scans show that she would be a candidate for a 23 mm Sapien 3 valve and her pelvic arterial access appears adequate for a transfemoral approach on either side. I reviewed the procedure of TAVR with her and her husband and answered all of their questions. They would like to proceed with TAVR.  Following the decision to proceed with transcatheter aortic valve replacement, a discussion has been held regarding what types of management strategies would be attempted intraoperatively in the event of life-threatening complications, including whether or not the patient would be considered a candidate for the use of cardiopulmonary bypass and/or conversion to open sternotomy for attempted surgical intervention. I told her that I did not think that sternotomy for open surgical valve replacement or repair of complications was advisable given her very high operative risk and likely poor outcome. They understand and agree. The patient has been advised of a variety of complications that might develop including but not limited to risks of death, stroke, paravalvular leak, aortic dissection or other major vascular complications, aortic annulus rupture, device embolization, cardiac rupture or perforation, mitral regurgitation, acute myocardial infarction, arrhythmia, heart block or bradycardia requiring permanent pacemaker placement, congestive heart failure, respiratory failure, renal failure, pneumonia, infection, other late complications related to structural valve deterioration or migration, or other complications that might ultimately cause a temporary or permanent loss of functional independence or other long term morbidity. The patient provides full informed consent for the procedure as described and all questions were answered.    Plan:  Transfemoral TAVR on  02/19/2015    Alleen Borne, MD 02/11/2015

## 2015-02-19 ENCOUNTER — Inpatient Hospital Stay (HOSPITAL_COMMUNITY): Payer: Medicare Other | Admitting: Emergency Medicine

## 2015-02-19 ENCOUNTER — Inpatient Hospital Stay (HOSPITAL_COMMUNITY): Payer: Medicare Other

## 2015-02-19 ENCOUNTER — Inpatient Hospital Stay (HOSPITAL_COMMUNITY)
Admission: RE | Admit: 2015-02-19 | Discharge: 2015-02-22 | DRG: 266 | Disposition: A | Discharge status: Home / Self Care | Payer: Medicare Other | Source: Ambulatory Visit | Attending: Thoracic Surgery (Cardiothoracic Vascular Surgery) | Admitting: Thoracic Surgery (Cardiothoracic Vascular Surgery)

## 2015-02-19 ENCOUNTER — Inpatient Hospital Stay (HOSPITAL_COMMUNITY): Payer: Medicare Other | Admitting: Certified Registered Nurse Anesthetist

## 2015-02-19 ENCOUNTER — Encounter (HOSPITAL_COMMUNITY): Payer: Self-pay | Admitting: Anesthesiology

## 2015-02-19 ENCOUNTER — Encounter (HOSPITAL_COMMUNITY)
Admission: RE | Disposition: A | Discharge status: Home / Self Care | Payer: Medicare Other | Source: Ambulatory Visit | Attending: Thoracic Surgery (Cardiothoracic Vascular Surgery)

## 2015-02-19 DIAGNOSIS — D649 Anemia, unspecified: Secondary | ICD-10-CM | POA: Diagnosis present

## 2015-02-19 DIAGNOSIS — I132 Hypertensive heart and chronic kidney disease with heart failure and with stage 5 chronic kidney disease, or end stage renal disease: Secondary | ICD-10-CM | POA: Diagnosis present

## 2015-02-19 DIAGNOSIS — K219 Gastro-esophageal reflux disease without esophagitis: Secondary | ICD-10-CM | POA: Diagnosis present

## 2015-02-19 DIAGNOSIS — Z87891 Personal history of nicotine dependence: Secondary | ICD-10-CM

## 2015-02-19 DIAGNOSIS — Z006 Encounter for examination for normal comparison and control in clinical research program: Secondary | ICD-10-CM | POA: Diagnosis not present

## 2015-02-19 DIAGNOSIS — J9811 Atelectasis: Secondary | ICD-10-CM

## 2015-02-19 DIAGNOSIS — F329 Major depressive disorder, single episode, unspecified: Secondary | ICD-10-CM | POA: Diagnosis present

## 2015-02-19 DIAGNOSIS — Z79899 Other long term (current) drug therapy: Secondary | ICD-10-CM

## 2015-02-19 DIAGNOSIS — Z888 Allergy status to other drugs, medicaments and biological substances status: Secondary | ICD-10-CM | POA: Diagnosis not present

## 2015-02-19 DIAGNOSIS — I5033 Acute on chronic diastolic (congestive) heart failure: Secondary | ICD-10-CM | POA: Diagnosis present

## 2015-02-19 DIAGNOSIS — Z9981 Dependence on supplemental oxygen: Secondary | ICD-10-CM

## 2015-02-19 DIAGNOSIS — Z86711 Personal history of pulmonary embolism: Secondary | ICD-10-CM | POA: Diagnosis not present

## 2015-02-19 DIAGNOSIS — E785 Hyperlipidemia, unspecified: Secondary | ICD-10-CM | POA: Diagnosis present

## 2015-02-19 DIAGNOSIS — J9610 Chronic respiratory failure, unspecified whether with hypoxia or hypercapnia: Secondary | ICD-10-CM | POA: Diagnosis present

## 2015-02-19 DIAGNOSIS — Z6836 Body mass index (BMI) 36.0-36.9, adult: Secondary | ICD-10-CM

## 2015-02-19 DIAGNOSIS — I10 Essential (primary) hypertension: Secondary | ICD-10-CM | POA: Diagnosis not present

## 2015-02-19 DIAGNOSIS — Z886 Allergy status to analgesic agent status: Secondary | ICD-10-CM

## 2015-02-19 DIAGNOSIS — Z992 Dependence on renal dialysis: Secondary | ICD-10-CM

## 2015-02-19 DIAGNOSIS — Z952 Presence of prosthetic heart valve: Secondary | ICD-10-CM

## 2015-02-19 DIAGNOSIS — I509 Heart failure, unspecified: Secondary | ICD-10-CM

## 2015-02-19 DIAGNOSIS — I340 Nonrheumatic mitral (valve) insufficiency: Secondary | ICD-10-CM | POA: Diagnosis present

## 2015-02-19 DIAGNOSIS — J479 Bronchiectasis, uncomplicated: Secondary | ICD-10-CM | POA: Diagnosis present

## 2015-02-19 DIAGNOSIS — J449 Chronic obstructive pulmonary disease, unspecified: Secondary | ICD-10-CM | POA: Diagnosis present

## 2015-02-19 DIAGNOSIS — N186 End stage renal disease: Secondary | ICD-10-CM | POA: Diagnosis present

## 2015-02-19 DIAGNOSIS — N2581 Secondary hyperparathyroidism of renal origin: Secondary | ICD-10-CM | POA: Diagnosis present

## 2015-02-19 DIAGNOSIS — D638 Anemia in other chronic diseases classified elsewhere: Secondary | ICD-10-CM | POA: Diagnosis present

## 2015-02-19 DIAGNOSIS — R011 Cardiac murmur, unspecified: Secondary | ICD-10-CM | POA: Diagnosis present

## 2015-02-19 DIAGNOSIS — Z86718 Personal history of other venous thrombosis and embolism: Secondary | ICD-10-CM

## 2015-02-19 DIAGNOSIS — Z954 Presence of other heart-valve replacement: Secondary | ICD-10-CM | POA: Diagnosis not present

## 2015-02-19 DIAGNOSIS — I350 Nonrheumatic aortic (valve) stenosis: Secondary | ICD-10-CM

## 2015-02-19 HISTORY — PX: TRANSCATHETER AORTIC VALVE REPLACEMENT, TRANSFEMORAL: SHX6400

## 2015-02-19 HISTORY — PX: TEE WITHOUT CARDIOVERSION: SHX5443

## 2015-02-19 LAB — POCT I-STAT, CHEM 8
BUN: 15 mg/dL (ref 6–20)
BUN: 16 mg/dL (ref 6–20)
BUN: 16 mg/dL (ref 6–20)
BUN: 19 mg/dL (ref 6–20)
CALCIUM ION: 1.13 mmol/L (ref 1.13–1.30)
CALCIUM ION: 1.19 mmol/L (ref 1.13–1.30)
CALCIUM ION: 1.12 mmol/L — AB (ref 1.13–1.30)
CHLORIDE: 96 mmol/L — AB (ref 101–111)
CHLORIDE: 95 mmol/L — AB (ref 101–111)
CHLORIDE: 97 mmol/L — AB (ref 101–111)
CREATININE: 4.7 mg/dL — AB (ref 0.44–1.00)
Calcium, Ion: 1.11 mmol/L — ABNORMAL LOW (ref 1.13–1.30)
Chloride: 97 mmol/L — ABNORMAL LOW (ref 101–111)
Creatinine, Ser: 4.6 mg/dL — ABNORMAL HIGH (ref 0.44–1.00)
Creatinine, Ser: 4.4 mg/dL — ABNORMAL HIGH (ref 0.44–1.00)
Creatinine, Ser: 5.1 mg/dL — ABNORMAL HIGH (ref 0.44–1.00)
GLUCOSE: 126 mg/dL — AB (ref 65–99)
GLUCOSE: 88 mg/dL (ref 65–99)
Glucose, Bld: 94 mg/dL (ref 65–99)
Glucose, Bld: 122 mg/dL — ABNORMAL HIGH (ref 65–99)
HCT: 24 % — ABNORMAL LOW (ref 36.0–46.0)
HCT: 23 % — ABNORMAL LOW (ref 36.0–46.0)
HCT: 26 % — ABNORMAL LOW (ref 36.0–46.0)
HEMATOCRIT: 28 % — AB (ref 36.0–46.0)
HEMOGLOBIN: 9.5 g/dL — AB (ref 12.0–15.0)
HEMOGLOBIN: 8.2 g/dL — AB (ref 12.0–15.0)
Hemoglobin: 7.8 g/dL — ABNORMAL LOW (ref 12.0–15.0)
Hemoglobin: 8.8 g/dL — ABNORMAL LOW (ref 12.0–15.0)
POTASSIUM: 3.3 mmol/L — AB (ref 3.5–5.1)
POTASSIUM: 3.2 mmol/L — AB (ref 3.5–5.1)
POTASSIUM: 2.9 mmol/L — AB (ref 3.5–5.1)
Potassium: 3.9 mmol/L (ref 3.5–5.1)
SODIUM: 142 mmol/L (ref 135–145)
SODIUM: 141 mmol/L (ref 135–145)
SODIUM: 142 mmol/L (ref 135–145)
Sodium: 141 mmol/L (ref 135–145)
TCO2: 31 mmol/L (ref 0–100)
TCO2: 29 mmol/L (ref 0–100)
TCO2: 32 mmol/L (ref 0–100)
TCO2: 35 mmol/L (ref 0–100)

## 2015-02-19 LAB — POCT I-STAT 3, ART BLOOD GAS (G3+)
Acid-Base Excess: 7 mmol/L — ABNORMAL HIGH (ref 0.0–2.0)
Bicarbonate: 33.8 mEq/L — ABNORMAL HIGH (ref 20.0–24.0)
O2 Saturation: 93 %
PCO2 ART: 59.4 mmHg — AB (ref 35.0–45.0)
PH ART: 7.354 (ref 7.350–7.450)
TCO2: 36 mmol/L (ref 0–100)
pO2, Arterial: 67 mmHg — ABNORMAL LOW (ref 80.0–100.0)

## 2015-02-19 LAB — CBC
HCT: 27.4 % — ABNORMAL LOW (ref 36.0–46.0)
HEMATOCRIT: 25.7 % — AB (ref 36.0–46.0)
Hemoglobin: 8.2 g/dL — ABNORMAL LOW (ref 12.0–15.0)
Hemoglobin: 8.5 g/dL — ABNORMAL LOW (ref 12.0–15.0)
MCH: 33.9 pg (ref 26.0–34.0)
MCH: 32.6 pg (ref 26.0–34.0)
MCHC: 31.9 g/dL (ref 30.0–36.0)
MCHC: 31 g/dL (ref 30.0–36.0)
MCV: 106.2 fL — AB (ref 78.0–100.0)
MCV: 105 fL — ABNORMAL HIGH (ref 78.0–100.0)
PLATELETS: 143 10*3/uL — AB (ref 150–400)
PLATELETS: 164 10*3/uL (ref 150–400)
RBC: 2.42 MIL/uL — AB (ref 3.87–5.11)
RBC: 2.61 MIL/uL — ABNORMAL LOW (ref 3.87–5.11)
RDW: 14 % (ref 11.5–15.5)
RDW: 14.3 % (ref 11.5–15.5)
WBC: 6.7 10*3/uL (ref 4.0–10.5)
WBC: 5.9 10*3/uL (ref 4.0–10.5)

## 2015-02-19 LAB — BLOOD GAS, ARTERIAL
Acid-Base Excess: 9 mmol/L — ABNORMAL HIGH (ref 0.0–2.0)
BICARBONATE: 32.3 meq/L — AB (ref 20.0–24.0)
DRAWN BY: 42624
O2 CONTENT: 3 L/min
O2 SAT: 98.2 %
PO2 ART: 102 mmHg — AB (ref 80.0–100.0)
Patient temperature: 98.6
TCO2: 33.5 mmol/L (ref 0–100)
pCO2 arterial: 38.1 mmHg (ref 35.0–45.0)
pH, Arterial: 7.538 — ABNORMAL HIGH (ref 7.350–7.450)

## 2015-02-19 LAB — GLUCOSE, CAPILLARY
GLUCOSE-CAPILLARY: 85 mg/dL (ref 65–99)
GLUCOSE-CAPILLARY: 79 mg/dL (ref 65–99)
GLUCOSE-CAPILLARY: 85 mg/dL (ref 65–99)
GLUCOSE-CAPILLARY: 72 mg/dL (ref 65–99)

## 2015-02-19 LAB — PREPARE RBC (CROSSMATCH)

## 2015-02-19 LAB — POCT I-STAT 4, (NA,K, GLUC, HGB,HCT)
GLUCOSE: 106 mg/dL — AB (ref 65–99)
HEMATOCRIT: 29 % — AB (ref 36.0–46.0)
HEMOGLOBIN: 9.9 g/dL — AB (ref 12.0–15.0)
POTASSIUM: 3.7 mmol/L (ref 3.5–5.1)
SODIUM: 141 mmol/L (ref 135–145)

## 2015-02-19 LAB — PROTIME-INR
INR: 1.26 (ref 0.00–1.49)
Prothrombin Time: 15.9 seconds — ABNORMAL HIGH (ref 11.6–15.2)

## 2015-02-19 LAB — MAGNESIUM: MAGNESIUM: 1.6 mg/dL — AB (ref 1.7–2.4)

## 2015-02-19 LAB — APTT: APTT: 38 s — AB (ref 24–37)

## 2015-02-19 LAB — ABO/RH: ABO/RH(D): O POS

## 2015-02-19 SURGERY — IMPLANTATION, AORTIC VALVE, TRANSCATHETER, FEMORAL APPROACH
Anesthesia: General | Site: Groin | Laterality: Right

## 2015-02-19 MED ORDER — DEXTROSE 5 % IV SOLN
1.5000 g | INTRAVENOUS | Status: DC | PRN
  Administered 2015-02-19: 1.5 g via INTRAVENOUS

## 2015-02-19 MED ORDER — NITROGLYCERIN IN D5W 200-5 MCG/ML-% IV SOLN: 0.0000 ug/min | INTRAVENOUS | Status: DC

## 2015-02-19 MED ORDER — HEPARIN SODIUM (PORCINE) 1000 UNIT/ML IJ SOLN
INTRAMUSCULAR | Status: AC
  Filled 2015-02-19: qty 1

## 2015-02-19 MED ORDER — DEXTROSE 5 % IV SOLN: 4000.0000 ug | INTRAVENOUS | Status: DC | PRN

## 2015-02-19 MED ORDER — MORPHINE SULFATE (PF) 2 MG/ML IV SOLN: 2.0000 mg | INTRAVENOUS | Status: DC | PRN

## 2015-02-19 MED ORDER — SODIUM CHLORIDE 0.9 % IV SOLN
INTRAVENOUS | Status: DC
  Filled 2015-02-19: qty 2.5

## 2015-02-19 MED ORDER — VANCOMYCIN HCL IN DEXTROSE 1-5 GM/200ML-% IV SOLN: 1000.0000 mg | Freq: Once | INTRAVENOUS | Status: DC

## 2015-02-19 MED ORDER — ROCURONIUM BROMIDE 100 MG/10ML IV SOLN
INTRAVENOUS | Status: DC | PRN
  Administered 2015-02-19: 50 mg via INTRAVENOUS

## 2015-02-19 MED ORDER — REMIFENTANIL HCL 1 MG IV SOLR
INTRAVENOUS | Status: DC | PRN
  Administered 2015-02-19: .5 ug/kg/min via INTRAVENOUS

## 2015-02-19 MED ORDER — OXYCODONE HCL 5 MG PO TABS: 5.0000 mg | ORAL_TABLET | ORAL | Status: DC | PRN

## 2015-02-19 MED ORDER — DEXMEDETOMIDINE HCL IN NACL 400 MCG/100ML IV SOLN
INTRAVENOUS | Status: DC | PRN
  Administered 2015-02-19: .4 ug/kg/h via INTRAVENOUS

## 2015-02-19 MED ORDER — GLYCOPYRROLATE 0.2 MG/ML IJ SOLN
INTRAMUSCULAR | Status: DC | PRN
  Administered 2015-02-19: .4 mg via INTRAVENOUS

## 2015-02-19 MED ORDER — INSULIN ASPART 100 UNIT/ML ~~LOC~~ SOLN: 0.0000 [IU] | SUBCUTANEOUS | Status: DC

## 2015-02-19 MED ORDER — LACTATED RINGERS IV SOLN: 500.0000 mL | Freq: Once | INTRAVENOUS | Status: DC | PRN

## 2015-02-19 MED ORDER — VANCOMYCIN HCL 1000 MG IV SOLR
1500.0000 mg | INTRAVENOUS | Status: DC | PRN
  Administered 2015-02-19: 1500 mg via INTRAVENOUS

## 2015-02-19 MED ORDER — METOPROLOL TARTRATE 1 MG/ML IV SOLN: 2.5000 mg | INTRAVENOUS | Status: DC | PRN

## 2015-02-19 MED ORDER — ACETAMINOPHEN 160 MG/5ML PO SOLN: 650.0000 mg | Freq: Once | ORAL | Status: DC

## 2015-02-19 MED ORDER — MIDAZOLAM HCL 5 MG/5ML IJ SOLN
INTRAMUSCULAR | Status: DC | PRN
  Administered 2015-02-19: 1 mg via INTRAVENOUS

## 2015-02-19 MED ORDER — ACETAMINOPHEN 500 MG PO TABS
1000.0000 mg | ORAL_TABLET | Freq: Four times a day (QID) | ORAL | Status: DC
  Administered 2015-02-20 – 2015-02-22 (×7): 1000 mg via ORAL
  Filled 2015-02-19 (×10): qty 2

## 2015-02-19 MED ORDER — 0.9 % SODIUM CHLORIDE (POUR BTL) OPTIME
TOPICAL | Status: DC | PRN
  Administered 2015-02-19: 4000 mL

## 2015-02-19 MED ORDER — SODIUM CHLORIDE 0.9 % IV SOLN: 250.0000 mL | INTRAVENOUS | Status: DC | PRN

## 2015-02-19 MED ORDER — FENTANYL CITRATE (PF) 250 MCG/5ML IJ SOLN
INTRAMUSCULAR | Status: AC
  Filled 2015-02-19: qty 5

## 2015-02-19 MED ORDER — MIDAZOLAM HCL 2 MG/2ML IJ SOLN
INTRAMUSCULAR | Status: AC
  Filled 2015-02-19: qty 4

## 2015-02-19 MED ORDER — SODIUM CHLORIDE 0.9 % IV SOLN
INTRAVENOUS | Status: DC | PRN
  Administered 2015-02-19: 1500 mL

## 2015-02-19 MED ORDER — IODIXANOL 320 MG/ML IV SOLN
INTRAVENOUS | Status: DC | PRN
  Administered 2015-02-19: 80 mL via INTRAVENOUS

## 2015-02-19 MED ORDER — LIDOCAINE HCL (CARDIAC) 20 MG/ML IV SOLN
INTRAVENOUS | Status: DC | PRN
  Administered 2015-02-19: 80 mg via INTRAVENOUS

## 2015-02-19 MED ORDER — CHLORHEXIDINE GLUCONATE 0.12 % MT SOLN
15.0000 mL | OROMUCOSAL | Status: AC
  Administered 2015-02-19: 15 mL via OROMUCOSAL

## 2015-02-19 MED ORDER — METOPROLOL TARTRATE 25 MG/10 ML ORAL SUSPENSION
12.5000 mg | Freq: Two times a day (BID) | ORAL | Status: DC
  Filled 2015-02-19 (×2): qty 5

## 2015-02-19 MED ORDER — SODIUM CHLORIDE 0.9 % IV SOLN
INTRAVENOUS | Status: DC | PRN
  Administered 2015-02-19: 08:00:00 via INTRAVENOUS

## 2015-02-19 MED ORDER — PROPOFOL 10 MG/ML IV BOLUS
INTRAVENOUS | Status: AC
  Filled 2015-02-19: qty 20

## 2015-02-19 MED ORDER — SODIUM CHLORIDE 0.9 % IJ SOLN
3.0000 mL | Freq: Two times a day (BID) | INTRAMUSCULAR | Status: DC
  Administered 2015-02-21: 3 mL via INTRAVENOUS

## 2015-02-19 MED ORDER — REMIFENTANIL HCL 2 MG IV SOLR
0.5000 ug/kg | INTRAVENOUS | Status: DC | PRN
  Filled 2015-02-19: qty 2000

## 2015-02-19 MED ORDER — ONDANSETRON HCL 4 MG/2ML IJ SOLN
4.0000 mg | Freq: Four times a day (QID) | INTRAMUSCULAR | Status: DC | PRN
  Administered 2015-02-19: 4 mg via INTRAVENOUS

## 2015-02-19 MED ORDER — ALBUMIN HUMAN 5 % IV SOLN
250.0000 mL | INTRAVENOUS | Status: AC | PRN
  Filled 2015-02-19: qty 250

## 2015-02-19 MED ORDER — PROTAMINE SULFATE 10 MG/ML IV SOLN
INTRAVENOUS | Status: DC | PRN
  Administered 2015-02-19: 30 mg via INTRAVENOUS
  Administered 2015-02-19: 20 mg via INTRAVENOUS
  Administered 2015-02-19: 30 mg via INTRAVENOUS
  Administered 2015-02-19: 20 mg via INTRAVENOUS
  Administered 2015-02-19: 10 mg via INTRAVENOUS
  Administered 2015-02-19: 20 mg via INTRAVENOUS

## 2015-02-19 MED ORDER — PROPOFOL 10 MG/ML IV BOLUS
INTRAVENOUS | Status: DC | PRN
  Administered 2015-02-19: 50 mg via INTRAVENOUS
  Administered 2015-02-19: 100 mg via INTRAVENOUS

## 2015-02-19 MED ORDER — SODIUM CHLORIDE 0.9 % IV SOLN: Freq: Once | INTRAVENOUS | Status: DC

## 2015-02-19 MED ORDER — ACETAMINOPHEN 650 MG RE SUPP: 650.0000 mg | Freq: Once | RECTAL | Status: DC

## 2015-02-19 MED ORDER — DEXTROSE 5 % IV SOLN
1.5000 g | INTRAVENOUS | Status: AC
  Administered 2015-02-20 – 2015-02-21 (×2): 1.5 g via INTRAVENOUS
  Filled 2015-02-19 (×2): qty 1.5

## 2015-02-19 MED ORDER — ACETAMINOPHEN 160 MG/5ML PO SOLN: 1000.0000 mg | Freq: Four times a day (QID) | ORAL | Status: DC

## 2015-02-19 MED ORDER — SODIUM CHLORIDE 0.9 % IJ SOLN: 3.0000 mL | INTRAMUSCULAR | Status: DC | PRN

## 2015-02-19 MED ORDER — FAMOTIDINE IN NACL 20-0.9 MG/50ML-% IV SOLN: 20.0000 mg | INTRAVENOUS | Status: DC

## 2015-02-19 MED ORDER — METOPROLOL TARTRATE 12.5 MG HALF TABLET
12.5000 mg | ORAL_TABLET | Freq: Two times a day (BID) | ORAL | Status: DC
  Administered 2015-02-20 – 2015-02-21 (×3): 12.5 mg via ORAL
  Filled 2015-02-19 (×5): qty 1

## 2015-02-19 MED ORDER — PHENYLEPHRINE HCL 10 MG/ML IJ SOLN
0.0000 ug/min | INTRAVENOUS | Status: DC
  Administered 2015-02-19: 10 ug/min via INTRAVENOUS
  Filled 2015-02-19 (×2): qty 2

## 2015-02-19 MED ORDER — INSULIN REGULAR BOLUS VIA INFUSION
0.0000 [IU] | Freq: Three times a day (TID) | INTRAVENOUS | Status: DC
  Filled 2015-02-19: qty 10

## 2015-02-19 MED ORDER — ALBUMIN HUMAN 5 % IV SOLN
INTRAVENOUS | Status: DC | PRN
Start: 2015-02-19 — End: 2015-02-19
  Administered 2015-02-19: 10:00:00 via INTRAVENOUS

## 2015-02-19 MED ORDER — NEOSTIGMINE METHYLSULFATE 10 MG/10ML IV SOLN
INTRAVENOUS | Status: DC | PRN
  Administered 2015-02-19: 3 mg via INTRAVENOUS

## 2015-02-19 MED ORDER — ASPIRIN EC 325 MG PO TBEC
325.0000 mg | DELAYED_RELEASE_TABLET | Freq: Every day | ORAL | Status: DC
  Administered 2015-02-20 – 2015-02-21 (×2): 325 mg via ORAL
  Filled 2015-02-19 (×3): qty 1

## 2015-02-19 MED ORDER — CLOPIDOGREL BISULFATE 75 MG PO TABS
75.0000 mg | ORAL_TABLET | Freq: Every day | ORAL | Status: DC
  Administered 2015-02-20 – 2015-02-22 (×3): 75 mg via ORAL
  Filled 2015-02-19 (×4): qty 1

## 2015-02-19 MED ORDER — HEPARIN SODIUM (PORCINE) 1000 UNIT/ML IJ SOLN
INTRAMUSCULAR | Status: DC | PRN
  Administered 2015-02-19: 13000 [IU] via INTRAVENOUS

## 2015-02-19 MED ORDER — SODIUM CHLORIDE 0.9 % IV SOLN
INTRAVENOUS | Status: DC | PRN
  Administered 2015-02-19: 07:00:00 via INTRAVENOUS

## 2015-02-19 MED ORDER — PANTOPRAZOLE SODIUM 40 MG PO TBEC
40.0000 mg | DELAYED_RELEASE_TABLET | Freq: Every day | ORAL | Status: DC
  Administered 2015-02-21: 40 mg via ORAL
  Filled 2015-02-19: qty 1

## 2015-02-19 MED ORDER — FENTANYL CITRATE (PF) 100 MCG/2ML IJ SOLN
INTRAMUSCULAR | Status: DC | PRN
  Administered 2015-02-19 (×3): 50 ug via INTRAVENOUS

## 2015-02-19 MED ORDER — MIDAZOLAM HCL 2 MG/2ML IJ SOLN: 2.0000 mg | INTRAMUSCULAR | Status: DC | PRN

## 2015-02-19 MED ORDER — ASPIRIN 81 MG PO CHEW: 324.0000 mg | CHEWABLE_TABLET | Freq: Every day | ORAL | Status: DC

## 2015-02-19 MED FILL — Heparin Sodium (Porcine) Inj 1000 Unit/ML: INTRAMUSCULAR | Qty: 30 | Status: AC

## 2015-02-19 MED FILL — Potassium Chloride Inj 2 mEq/ML: INTRAVENOUS | Qty: 40 | Status: AC

## 2015-02-19 MED FILL — Magnesium Sulfate Inj 50%: INTRAMUSCULAR | Qty: 10 | Status: AC

## 2015-02-19 SURGICAL SUPPLY — 104 items
ADAPTER UNIV SWAN GANZ BIP (ADAPTER) ×2 IMPLANT
ADAPTER UNV SWAN GANZ BIP (ADAPTER) ×1
ATTRACTOMAT 16X20 MAGNETIC DRP (DRAPES) IMPLANT
BAG BANDED W/RUBBER/TAPE 36X54 (MISCELLANEOUS) ×3 IMPLANT
BAG DECANTER FOR FLEXI CONT (MISCELLANEOUS) IMPLANT
BAG SNAP BAND KOVER 36X36 (MISCELLANEOUS) ×6 IMPLANT
BLADE 10 SAFETY STRL DISP (BLADE) IMPLANT
BLADE STERNUM SYSTEM 6 (BLADE) ×3 IMPLANT
BLADE SURG ROTATE 9660 (MISCELLANEOUS) ×3 IMPLANT
CABLE PACING FASLOC BIEGE (MISCELLANEOUS) ×3 IMPLANT
CABLE PACING FASLOC BLUE (MISCELLANEOUS) ×3 IMPLANT
CANISTER SUCTION 2500CC (MISCELLANEOUS) IMPLANT
CANNULA FEM VENOUS REMOTE 22FR (CANNULA) IMPLANT
CANNULA OPTISITE PERFUSION 16F (CANNULA) IMPLANT
CANNULA OPTISITE PERFUSION 18F (CANNULA) IMPLANT
CATH DIAG EXPO 6F VENT PIG 145 (CATHETERS) ×6 IMPLANT
CATH EXPO 5FR AL1 (CATHETERS) ×3 IMPLANT
CATH S G BIP PACING (SET/KITS/TRAYS/PACK) ×6 IMPLANT
CLIP TI MEDIUM 24 (CLIP) ×3 IMPLANT
CLIP TI WIDE RED SMALL 24 (CLIP) ×3 IMPLANT
CONT SPEC 4OZ CLIKSEAL STRL BL (MISCELLANEOUS) ×9 IMPLANT
COVER DOME SNAP 22 D (MISCELLANEOUS) ×3 IMPLANT
COVER MAYO STAND STRL (DRAPES) ×3 IMPLANT
COVER TABLE BACK 60X90 (DRAPES) ×3 IMPLANT
CRADLE DONUT ADULT HEAD (MISCELLANEOUS) ×3 IMPLANT
DERMABOND ADVANCED (GAUZE/BANDAGES/DRESSINGS) ×1
DERMABOND ADVANCED .7 DNX12 (GAUZE/BANDAGES/DRESSINGS) ×2 IMPLANT
DEVICE CLOSURE PERCLS PRGLD 6F (VASCULAR PRODUCTS) ×4 IMPLANT
DRAPE INCISE IOBAN 66X45 STRL (DRAPES) ×3 IMPLANT
DRAPE SLUSH MACHINE 52X66 (DRAPES) ×3 IMPLANT
DRAPE TABLE COVER HEAVY DUTY (DRAPES) ×3 IMPLANT
DRSG TEGADERM 4X4.75 (GAUZE/BANDAGES/DRESSINGS) ×3 IMPLANT
ELECT REM PT RETURN 9FT ADLT (ELECTROSURGICAL) ×6
ELECTRODE REM PT RTRN 9FT ADLT (ELECTROSURGICAL) ×4 IMPLANT
FELT TEFLON 6X6 (MISCELLANEOUS) ×3 IMPLANT
FEMORAL VENOUS CANN RAP (CANNULA) IMPLANT
GAUZE SPONGE 4X4 12PLY STRL (GAUZE/BANDAGES/DRESSINGS) ×3 IMPLANT
GLOVE BIO SURGEON STRL SZ7.5 (GLOVE) ×6 IMPLANT
GLOVE BIO SURGEON STRL SZ8 (GLOVE) ×3 IMPLANT
GLOVE BIOGEL PI IND STRL 6.5 (GLOVE) ×10 IMPLANT
GLOVE BIOGEL PI IND STRL 7.5 (GLOVE) ×4 IMPLANT
GLOVE BIOGEL PI INDICATOR 6.5 (GLOVE) ×5
GLOVE BIOGEL PI INDICATOR 7.5 (GLOVE) ×2
GLOVE ECLIPSE 7.5 STRL STRAW (GLOVE) ×6 IMPLANT
GLOVE EUDERMIC 7 POWDERFREE (GLOVE) ×3 IMPLANT
GLOVE ORTHO TXT STRL SZ7.5 (GLOVE) ×3 IMPLANT
GOWN STRL REUS W/ TWL LRG LVL3 (GOWN DISPOSABLE) ×10 IMPLANT
GOWN STRL REUS W/ TWL XL LVL3 (GOWN DISPOSABLE) ×12 IMPLANT
GOWN STRL REUS W/TWL LRG LVL3 (GOWN DISPOSABLE) ×5
GOWN STRL REUS W/TWL XL LVL3 (GOWN DISPOSABLE) ×6
GUIDEWIRE SAF TJ AMPL .035X180 (WIRE) ×3 IMPLANT
GUIDEWIRE SAFE TJ AMPLATZ EXST (WIRE) ×3 IMPLANT
GUIDEWIRE STRAIGHT .035 260CM (WIRE) ×3 IMPLANT
INSERT FOGARTY 61MM (MISCELLANEOUS) IMPLANT
INSERT FOGARTY SM (MISCELLANEOUS) ×9 IMPLANT
INSERT FOGARTY XLG (MISCELLANEOUS) ×3 IMPLANT
KIT BASIN OR (CUSTOM PROCEDURE TRAY) ×3 IMPLANT
KIT DILATOR VASC 18G NDL (KITS) IMPLANT
KIT HEART LEFT (KITS) ×3 IMPLANT
KIT ROOM TURNOVER OR (KITS) ×3 IMPLANT
KIT SUCTION CATH 14FR (SUCTIONS) ×6 IMPLANT
NEEDLE PERC 18GX7CM (NEEDLE) ×3 IMPLANT
NS IRRIG 1000ML POUR BTL (IV SOLUTION) ×9 IMPLANT
PACK AORTA (CUSTOM PROCEDURE TRAY) ×3 IMPLANT
PAD ARMBOARD 7.5X6 YLW CONV (MISCELLANEOUS) ×6 IMPLANT
PAD ELECT DEFIB RADIOL ZOLL (MISCELLANEOUS) ×3 IMPLANT
PATCH TACHOSII LRG 9.5X4.8 (VASCULAR PRODUCTS) IMPLANT
PERCLOSE PROGLIDE 6F (VASCULAR PRODUCTS) ×6
SET MICROPUNCTURE 5F STIFF (MISCELLANEOUS) ×3 IMPLANT
SHEATH AVANTI 11CM 8FR (MISCELLANEOUS) ×3 IMPLANT
SHEATH PINNACLE 6F 10CM (SHEATH) ×3 IMPLANT
SLEEVE REPOSITIONING LENGTH 30 (MISCELLANEOUS) ×3 IMPLANT
SPONGE LAP 4X18 X RAY DECT (DISPOSABLE) ×3 IMPLANT
STOPCOCK MORSE 400PSI 3WAY (MISCELLANEOUS) ×12 IMPLANT
SUT ETHIBOND X763 2 0 SH 1 (SUTURE) ×3 IMPLANT
SUT GORETEX CV 4 TH 22 36 (SUTURE) ×3 IMPLANT
SUT GORETEX CV4 TH-18 (SUTURE) ×9 IMPLANT
SUT GORETEX TH-18 36 INCH (SUTURE) ×6 IMPLANT
SUT MNCRL AB 3-0 PS2 18 (SUTURE) ×3 IMPLANT
SUT PROLENE 3 0 SH1 36 (SUTURE) IMPLANT
SUT PROLENE 4 0 RB 1 (SUTURE) ×1
SUT PROLENE 4-0 RB1 .5 CRCL 36 (SUTURE) ×2 IMPLANT
SUT PROLENE 5 0 C 1 36 (SUTURE) ×6 IMPLANT
SUT PROLENE 6 0 C 1 30 (SUTURE) ×6 IMPLANT
SUT SILK  1 MH (SUTURE) ×1
SUT SILK 1 MH (SUTURE) ×2 IMPLANT
SUT SILK 2 0 SH CR/8 (SUTURE) IMPLANT
SUT VIC AB 2-0 CT1 27 (SUTURE) ×1
SUT VIC AB 2-0 CT1 TAPERPNT 27 (SUTURE) ×2 IMPLANT
SUT VIC AB 2-0 CTX 36 (SUTURE) IMPLANT
SUT VIC AB 3-0 SH 8-18 (SUTURE) ×6 IMPLANT
SYR 30ML LL (SYRINGE) ×6 IMPLANT
SYR 50ML LL SCALE MARK (SYRINGE) ×3 IMPLANT
SYRINGE 10CC LL (SYRINGE) ×9 IMPLANT
TOWEL OR 17X26 10 PK STRL BLUE (TOWEL DISPOSABLE) ×6 IMPLANT
TRANSDUCER W/STOPCOCK (MISCELLANEOUS) ×6 IMPLANT
TRAY FOLEY IC TEMP SENS 14FR (CATHETERS) ×3 IMPLANT
TUBE SUCT INTRACARD DLP 20F (MISCELLANEOUS) IMPLANT
TUBING ART PRESS 72  MALE/FEM (TUBING) ×1
TUBING ART PRESS 72 MALE/FEM (TUBING) ×2 IMPLANT
TUBING HIGH PRESSURE 120CM (CONNECTOR) ×3 IMPLANT
VALVE HEART TRANSCATH SZ3 23MM (Prosthesis & Implant Heart) ×3 IMPLANT
WIRE .035 3MM-J 145CM (WIRE) ×6 IMPLANT
WIRE AMPLATZ SS-J .035X180CM (WIRE) ×3 IMPLANT

## 2015-02-19 NOTE — Interval H&P Note (Signed)
History and Physical Interval Note:  02/19/2015 7:08 AM  Ariel Smith  has presented today for surgery, with the diagnosis of SEVERE AS  The various methods of treatment have been discussed with the patient and family. After consideration of risks, benefits and other options for treatment, the patient has consented to  Procedure(s): TRANSCATHETER AORTIC VALVE REPLACEMENT, TRANSFEMORAL (N/A) TRANSESOPHAGEAL ECHOCARDIOGRAM (TEE) (N/A) as a surgical intervention .  The patient's history has been reviewed, patient examined, no change in status, stable for surgery.  I have reviewed the patient's chart and labs.  Questions were answered to the patient's satisfaction.     Tonny Bollman

## 2015-02-19 NOTE — Transfer of Care (Signed)
Immediate Anesthesia Transfer of Care Note  Patient: Ariel Smith  Procedure(s) Performed: Procedure(s): TRANSCATHETER AORTIC VALVE REPLACEMENT, TRANSFEMORAL (Right) TRANSESOPHAGEAL ECHOCARDIOGRAM (TEE) (N/A)  Patient Location: SICU  Anesthesia Type:General  Level of Consciousness: awake, alert , oriented and patient cooperative  Airway & Oxygen Therapy: Patient Spontanous Breathing and Patient connected to face mask oxygen  Post-op Assessment: Report given to RN, Post -op Vital signs reviewed and stable and Patient moving all extremities  Post vital signs: Reviewed and stable  Last Vitals:  Filed Vitals:   02/19/15 0606  BP: 114/44  Pulse: 93  Temp: 36.4 C  Resp: 18    Complications: No apparent anesthesia complications

## 2015-02-19 NOTE — Consult Note (Addendum)
Ariel Smith 02/19/2015 Ariel Smith Requesting Physician:  Cornelius Moras MD  Reason for Consult:  ESRD, s/p TAVR HPI:  18F seen at the request of Dr. ON for comanagement of ESRD in the setting of transcatheter aortic valve replacement. Patient receives hemodialysis at [facility] in [place].  This morning she had aortic valve replacement.    She received dialysis yesterday at her outpatient Center. She describes it as uneventful. She uses a left forearm AV fistula. He has been working well recently. She follows with [facility].  Filed Weights   02/19/15 0606  Weight: 96.888 kg (213 lb 9.6 oz)      ROS Balance of 12 systems is negative w/ exceptions as above  Outpt HD Orders Unit: [facility] Days: [facility] Time: 3h56min Dialyzer: Elisio 15 EDW: 96kg K/Ca: 2/2.5 Access: AVF Needle Size: 15g  BFR/DFR: 450/600  VDRA: Hectorol 1.5qTx EPO: 3800 IU qTx IV Fe: Venofer 50qWk Heparin: none Most Recent Phos / PTH:  Most Recent TSAT / Ferritin:  Most Recent eKT/V:  Treatment Adherence:   PMH  Past Medical History  Diagnosis Date  . Osteoarthritis   . Depression   . Frequent headaches   . HTN (hypertension)   . HLD (hyperlipidemia)   . History of colon polyps 2013    colonoscopy ([name])  . 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 GERD (gastroesophageal reflux disease)   . COPD (chronic obstructive pulmonary disease) (HCC)   . ESRD (end stage renal disease) (HCC) 08/2011    a. on HD (TThSa), L forearm AV fistula, [name]  . Secondary hyperparathyroidism of renal origin (HCC)   . Osteopenia 01/2013  . Bronchiectasis (HCC) 08/2014     suggested by thoracic xray  . Anemia of chronic disease   . Severe aortic stenosis     a. echo 10/2014: EF 60-65%, no RWMA, GR1DD, mod to sev AS (peak vel 377 cm/s, mean gradient 34 mm Hg, peak gradient 57 mm Hg, valve area (VTA) 0.72  cm^2  . Chronic respiratory failure (HCC)     a. 2/2 COPD; b. on 4-5L via nasal cannula  . Morbid obesity (HCC)   . GIB (gastrointestinal bleeding)     a. leading to cessation of warfarin 11/2014  . Chronic diastolic CHF (congestive heart failure) (HCC)     a. echo 10/2014: EF 60-65%, no RWMA, GR1DD, severe AS, mild MR, PASP 46 mm Hg  . Pulmonary embolism (HCC) 2009  . Renal insufficiency     Patient has been on dialysis for " four years" -per patient.  Marland Kitchen Heart murmur   . 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 Cholecystectomy  2012  . Bunionectomy Left 2003  . Replacement total knee Left 2006  . Shoulder arthroscopy Right 2009  . Colonoscopy  08/2011    colon biopsies, [name]  . Esophagogastroduodenoscopy  08/2011    gastric cardia polyp  . Tonsillectomy  1955  . Tubal ligation  1980  . Exteriorization of a continuous ambulatory peritoneal dialysis catheter  01/2013    removal 12/2103 - [name]  . Hospitalization  12/2013    recurrent R pleural effusion due to peritoneal fluid translocation s/p rpt thoracentesis  with 1.3 L fluid removed, ERSD started on HD this hospitalization  . US echocardiography  12/2013    EF 55-60%, nl LV sys fxn, mild-mod MR, AS, increased LV posterior wall thickness, mild TR  . Esophagogastroduodenoscopy Left 11/11/2014    Procedure: ESOPHAGOGASTRODUODENOSCOPY (EGD);  Surgeon: [name], MD;  Location[facility] ENDOSCOPY;  Service: Endoscopy;  Laterality: Left;  . Peripheral vascular catheterization N/A 11/12/2014    Procedure: IVC Filter Insertion;  Surgeon: [name], MD;  Location: [facility] INVASIVE CV LAB;  Service: Cardiovascular;  Laterality: N/A;  . Cardiac catheterization N/A 01/18/2015    patent coronary arteries without significant osbtruction and preserved LV function,  severe aortic stenosis; Procedure: Right/Left Heart Cath and Coronary Angiography;  Surgeon: Tonny Bollman, MD   FH  Family History  Problem Relation Age of Onset  . Deep vein thrombosis Brother   . Cancer Mother 103    colon  . Stroke Mother   . Cancer Father     prostate  . Hypertension Father   . Dementia Father 26  . Hypertension Brother   . Diabetes Neg Hx   . CAD Neg Hx   . Cancer Daughter 40    breast   SH  reports that she quit smoking about 7 years ago. Her smoking use included Cigarettes. She started smoking about 36 years ago. She has a 60 pack-year smoking history. She has never used smokeless tobacco. She reports that she does not drink alcohol or use illicit drugs. Allergies  Allergies  Allergen Reactions  . Aspirin Other (See Comments)    Pt is unable to take because she has kidney disease.     Gaspar Skeeters [Isometheptene-Dichloral-Apap] Other (See Comments)    Reaction:  Headaches    Home medications Prior to Admission medications   Medication Sig Start Date End Date Taking? Authorizing Provider  acetaminophen (TYLENOL) 500 MG tablet Take 1,000 mg by mouth every 6 (six) hours as needed for mild pain or headache.    Yes Historical Provider, MD  albuterol (VENTOLIN HFA) 108 (90 BASE) MCG/ACT inhaler Inhale 2 puffs into the lungs every 4 (four) hours as needed for wheezing or shortness of breath.    Yes Historical Provider, MD  budesonide-formoterol (SYMBICORT) 160-4.5 MCG/ACT inhaler Inhale 2 puffs into the lungs 2 (two) times daily.   Yes Historical Provider, MD  citalopram (CELEXA) 20 MG tablet Take 1 tablet (20 mg total) by mouth daily. 01/07/15  Yes [name], MD  donepezil (ARICEPT) 5 MG tablet Take 1 tablet (5 mg total) by mouth at bedtime. 11/01/14  Yes [name], MD  furosemide (LASIX) 20 MG tablet Take 1 tablet (20 mg total) by mouth daily. 01/08/15  Yes [name], MD  hydrOXYzine (ATARAX/VISTARIL) 25 MG tablet TAKE ONE-HALF TABLET BY MOUTH  TWICE DAILY AS NEEDED Patient taking differently: TAKE ONE-HALF TABLET BY MOUTH TWICE DAILY 02/11/15  Yes [name], MD  lidocaine-prilocaine (EMLA) cream Apply 1 application topically as needed (topical anesthesia for hemodialysis if Gebauers and Lidocaine injection are ineffective.). 11/22/14  Yes Auburn Bilberry, MD  lovastatin (MEVACOR) 20 MG tablet TAKE ONE TABLET BY MOUTH AT BEDTIME 02/11/15  Yes [name], MD  Multiple Vitamin (MULTIVITAMIN WITH MINERALS) TABS tablet Take 1 tablet by mouth daily. 11/22/14  Yes Auburn Bilberry, MD  pantoprazole (PROTONIX) 40 MG tablet Take 40 mg by mouth daily as needed (for heartburn/indigestion).   Yes Historical Provider, MD  ALPRAZolam Prudy Feeler) 1 MG tablet Take 1 tablet (1 mg total)  by mouth 3 (three) times daily as needed for anxiety. 01/28/15   [name], MD  baclofen (LIORESAL) 10 MG tablet Take 0.5-1 tablets (5-10 mg total) by mouth 2 (two) times daily as needed for muscle spasms. 01/28/15   [name], MD  calcitRIOL (ROCALTROL) 0.25 MCG capsule Take 0.25 mcg by mouth daily.    Historical Provider, MD  calcium acetate (PHOSLO) 667 MG capsule Take 667-2,001 mg by mouth 3 (three) times daily with meals. Take 3 with meals and 1 with a snack    Historical Provider, MD  ciprofloxacin (CIPRO) 500 MG tablet Take 1 tablet (500 mg total) by mouth 2 (two) times daily. 02/15/15   Purcell Nails, MD  cyanocobalamin (,VITAMIN B-12,) 1000 MCG/ML injection Inject 1,000 mcg into the muscle every 30 (thirty) days.    Historical Provider, MD  docusate sodium (COLACE) 100 MG capsule Take 1 capsule (100 mg total) by mouth 2 (two) times daily. Patient taking differently: Take 100 mg by mouth 2 (two) times daily as needed for mild constipation.  11/22/14   Auburn Bilberry, MD  promethazine (PHENERGAN) 12.5 MG tablet Take 12.5 mg by mouth every 6 (six) hours as needed for nausea or vomiting.    Historical Provider, MD  traZODone (DESYREL) 50 MG tablet Take  0.5-1 tablets (25-50 mg total) by mouth at bedtime. Patient taking differently: Take 25 mg by mouth at bedtime.  11/01/14   [name], MD  zolpidem (AMBIEN) 5 MG tablet Take 1 tablet (5 mg total) by mouth at bedtime as needed for sleep. 01/14/15   [name], MD    Current Medications Scheduled Meds: . Melene Muller ON 02/20/2015] acetaminophen  1,000 mg Oral 4 times per day   Or  . [START ON 02/20/2015] acetaminophen (TYLENOL) oral liquid 160 mg/5 mL  1,000 mg Per Tube 4 times per day  . acetaminophen (TYLENOL) oral liquid 160 mg/5 mL  650 mg Per Tube Once   Or  . acetaminophen  650 mg Rectal Once  . [START ON 02/20/2015] aspirin EC  325 mg Oral Daily   Or  . [START ON 02/20/2015] aspirin  324 mg Per Tube Daily  . [START ON 02/20/2015] cefUROXime (ZINACEF)  IV  1.5 g Intravenous Q24H  . [START ON 02/20/2015] clopidogrel  75 mg Oral Q breakfast  . famotidine (PEPCID) IV  20 mg Intravenous Q24 Hr x 2  . insulin aspart  0-24 Units Subcutaneous 6 times per day  . metoprolol tartrate  12.5 mg Oral BID   Or  . metoprolol tartrate  12.5 mg Per Tube BID  . [START ON 02/21/2015] pantoprazole  40 mg Oral Daily  . sodium chloride  3 mL Intravenous Q12H   Continuous Infusions: . nitroGLYCERIN Stopped (02/19/15 1025)  . phenylephrine (NEO-SYNEPHRINE) Adult infusion 10 mcg/min (02/19/15 1300)   PRN Meds:.sodium chloride, albumin human, lactated ringers, metoprolol, midazolam, morphine injection, ondansetron (ZOFRAN) IV, oxyCODONE, sodium chloride  CBC  Recent Labs Lab 02/15/15 1056  02/19/15 0948 02/19/15 1039 02/19/15 1051  WBC 6.4  --   --   --  6.7  HGB 10.3*  < > 8.2* 7.8* 8.2*  HCT 31.9*  < > 24.0* 23.0* 25.7*  MCV 106.0*  --   --   --  106.2*  PLT 197  --   --   --  143*  < > = values in this interval not displayed. Basic Metabolic Panel  Recent Labs Lab 02/15/15 1056 02/19/15 0653 02/19/15 0822 02/19/15 0948 02/19/15  1039  NA 136 141 141 142 141  K 3.9 3.7 3.3* 3.2*  2.9*  CL 95*  --  97* 96* 95*  CO2 27  --   --   --   --   GLUCOSE 140* 106* 94 126* 122*  BUN 26*  --  CREATININE 6.04*  --  4.70* 4.60* 4.40*  CALCIUM 9.7  --   --   --   --     Physical Exam  Blood pressure 95/66, pulse 90, temperature 97.9 F (36.6 C), temperature source Core (Comment), resp. rate 16, height  (1.626 m), weight 96.888 kg (213 lb 9.6 oz), SpO2 94 %. GEN: NAD, resting ENT: NCAT EYES: EOMI CV: RRR, nl s1s2 PULM: ctab ABD: s/nt/nd SKIN: no rashes/lesions EXT:No LEE AVF: LFA AVF+B/T   A/P 1. ESRD: [facility] 1. Last HD 10/31 2. Currently no acute HD indications 3. Follow Closely for HD needs 4. Outpt records requested 2. 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 HTN/Vol: follow for UF needs, currently on PE gtt 4. Anemia: Hb stable, monitor 5. MBD: obtain outpt recores  Sabra Heck MD 02/19/2015, 2:08 PM

## 2015-02-19 NOTE — Op Note (Signed)
HEART AND VASCULAR CENTER   MULTIDISCIPLINARY HEART VALVE TEAM   TAVR OPERATIVE NOTE   Date of Procedure:  02/19/2015  Preoperative Diagnosis: Severe Aortic Stenosis   Postoperative Diagnosis: Same   Procedure:    Transcatheter Aortic Valve Replacement - Percutaneous Right Transfemoral Approach  Edwards Sapien 3 THV (size 23 mm, model # 9600TFX, serial # [id number])   Co-Surgeons:  Salvatore Decent. Cornelius Moras, MD and Tonny Bollman, MD  Assistants:   Alleen Borne, MD   Anesthesiologist:  Remonia Richter, MD  Echocardiographer:  Marca Ancona, MD  Pre-operative Echo Findings:  Severe aortic stenosis  Normal left ventricular systolic function  Mild mitral regurgitation  Post-operative Echo Findings:  No paravalvular leak  Normal left ventricular systolic function    BRIEF CLINICAL NOTE AND INDICATIONS FOR SURGERY  Patient is an obese 69 year old female with history of aortic stenosis, end-stage renal disease on hemodialysis, hypertension, chronic diastolic congestive heart failure, COPD on home oxygen therapy, remote history of DVT and pulmonary embolus status post IVC filter placement, chronic anemia with recent history of GI bleeding, and severe physical deconditioning with limited mobility who has been referred for surgical consultation to discuss treatment options for management of severe symptomatic aortic stenosis. The patient has long-standing history of shortness of breath and chronic respiratory failure that is likely multifactorial and has been attributed to chronic diastolic congestive heart failure and COPD. She has chronic kidney disease attributed to hypertension that became end stage in 2013. She has been on hemodialysis ever since and currently dialyzes every Tuesday, Thursday, Saturday via left forearm AV fistula in the [facility] dialysis clinic. The patient and her husband state that she has been progressively getting worse for several years.   The patient  has been hospitalized several times at [facility] over the past 3 months. She initially presented in July with an acute upper GI bleed with hemoglobin 6.4. At the time she was chronically anticoagulated using warfarin and therapeutic with INR 2.7. Upper GI endoscopy revealed gastritis. She developed acute respiratory failure that was felt to be secondary to hospital-acquired pneumonia and acute exacerbation of chronic diastolic congestive heart failure. Warfarin therapy was discontinued and the patient underwent IVC filter placement. She was in the intensive care unit for approximately 2 weeks and eventually discharged from the hospital 11/22/2014. During that hospitalization she underwent transthoracic echocardiogram demonstrating the presence of severe aortic stenosis with peak velocity across the aortic valve measured 3.8 m/s corresponding to mean transvalvular gradient estimated 34 mmHg. Left ventricular systolic function appeared normal with ejection fraction estimated 60-65%. She has been hospitalized again three times since July with recurrent episodes of acute on chronic respiratory failure with hypoxia attributed to healthcare associated pneumonia and acute exacerbation of chronic diastolic and just of heart failure with fluid overload, most recently from 12/28/2014 to 12/31/2014. Each time symptoms reportedly improved with dialysis therapy to treat volume overload. According to the patient and her husband, her dialysis treatments have been extended to facilitate better fluid removal, and she has been doing a little better over the last few weeks. However, she remains extremely limited by severe exertional shortness of breath and fatigue and she still has intermittent episodes of resting shortness of breath. She was referred to Dr. Excell Seltzer and underwent diagnostic cardiac catheterization on 01/18/2015. Findings were consistent with severe aortic stenosis with mean gradient across  the aortic valve measured 40.7 mmHg. Pulmonary artery pressures were only mildly elevated and the patient did not have significant coronary  artery disease. The patient was referred for surgical consultation.  During the course of the patient's preoperative work up they have been evaluated comprehensively by a multidisciplinary team of specialists coordinated through the Multidisciplinary Heart Valve Clinic in the [facility].  They have been demonstrated to suffer from symptomatic severe aortic stenosis as noted above. The patient has been counseled extensively as to the relative risks and benefits of all options for the treatment of severe aortic stenosis including long term medical therapy, conventional surgery for aortic valve replacement, and transcatheter aortic valve replacement.  The patient has been independently evaluated by two cardiac surgeons including myself and Dr. Laneta Simmers, and both of [place] feel the patient would be a poor candidate for conventional surgery (predicted risk of mortality >15% and/or predicted risk of permanent morbidity >50%) because of comorbidities including dialysis-dependent renal failure and chronic respiratory failure.   Based upon review of all of the patient's preoperative diagnostic tests they are felt to be candidate for transcatheter aortic valve replacement using the transfemoral approach as an alternative to high risk conventional surgery.    Following the decision to proceed with transcatheter aortic valve replacement, a discussion has been held regarding what types of management strategies would be attempted intraoperatively in the event of life-threatening complications, including whether or not the patient would be considered a candidate for the use of cardiopulmonary bypass and/or conversion to open sternotomy for attempted surgical intervention.  The patient has been advised of a variety of complications that might develop peculiar to this  approach including but not limited to risks of death, stroke, paravalvular leak, aortic dissection or other major vascular complications, aortic annulus rupture, device embolization, cardiac rupture or perforation, acute myocardial infarction, arrhythmia, heart block or bradycardia requiring permanent pacemaker placement, congestive heart failure, respiratory failure, renal failure, pneumonia, infection, other late complications related to structural valve deterioration or migration, or other complications that might ultimately cause a temporary or permanent loss of functional independence or other long term morbidity.  The patient provides full informed consent for the procedure as described and all questions were answered preoperatively.    DETAILS OF THE OPERATIVE PROCEDURE  PREPARATION:    The patient is brought to the operating room on the above mentioned date and central monitoring was established by the anesthesia team including placement of Swan-Ganz catheter and radial arterial line. The patient is placed in the supine position on the operating table.  Intravenous antibiotics are administered. General endotracheal anesthesia is induced uneventfully. A Foley catheter is placed.  Baseline transesophageal echocardiogram was performed. The patient's chest, abdomen, both groins, and both lower extremities are prepared and draped in a sterile manner. A time out procedure is performed.   PERIPHERAL ACCESS:    Using the modified Seldinger technique, femoral arterial and venous access was obtained with placement of 6 Fr sheaths on the left side.  A pigtail diagnostic catheter was passed through the left femoral arterial sheath under fluoroscopic guidance into the aortic root.  A temporary transvenous pacemaker catheter was passed through the left femoral venous sheath under fluoroscopic guidance into the right ventricle.  The pacemaker was tested to ensure stable lead placement and pacemaker capture.  Aortic root angiography was performed in order to determine the optimal angiographic angle for valve deployment.   TRANSFEMORAL ACCESS:   Percutaneous transfemoral access and sheath placement was performed by Dr Excell Seltzer. Please see his separate operative note for details. The patient was heparinized systemically and ACT verified >  250 seconds.    A 14 French transfemoral E-sheath was introduced into the right femoral artery after progressively dilating over an Amplatz superstiff wire. An AL-1 catheter was used to direct a straight-tip exchange length wire across the native aortic valve into the left ventricle. This was exchanged out for a pigtail catheter and position was confirmed in the LV apex. Simultaneous LV and Ao pressures were recorded.  The pigtail catheter was then exchanged for an Amplatz Extra-stiff wire in the LV apex.     TRANSCATHETER HEART VALVE DEPLOYMENT:   An Edwards Sapien 3 THV (size 23 mm, model #9600TFX, serial #[id number]) was prepared and crimped per manufacturer's guidelines, and the proper orientation of the valve is confirmed on the [employer] delivery system. The valve was advanced through the introducer sheath using normal technique until in an appropriate position in the abdominal aorta beyond the sheath tip. The balloon was then retracted and using the fine-tuning wheel was centered on the valve. The valve was then advanced across the aortic arch using appropriate flexion of the catheter. The valve was carefully positioned across the aortic valve annulus. The Commander catheter was retracted using normal technique. Once final position of the valve has been confirmed by angiographic assessment, the valve is deployed while temporarily holding ventilation and during rapid ventricular pacing to maintain systolic blood pressure < 50 mmHg and pulse pressure < 10 mmHg. The balloon inflation is held for >3 seconds after reaching full deployment volume. Once the balloon has  fully deflated the balloon is retracted into the ascending aorta and valve function is assessed using TEE. There is felt to be no paravalvular leak and no central aortic insufficiency.  The patient's hemodynamic recovery following valve deployment is good.  The deployment balloon and guidewire are both removed. Echo demostrated acceptable post-procedural gradients, stable mitral valve function, and no aortic insufficiency.   PROCEDURE COMPLETION:   The sheath was removed and femoral artery closure performed by Dr Excell Seltzer. Please see his separate report for details. Distal abdominal aortography was performed to evaluate for any arterial injury related to the procedure. There was no evidence dissection, perforation, or other vascular injury in the abdominal aorta, iliac artery, or femoral artery.  Protamine was administered once femoral arterial repair was complete. The temporary pacemaker, pigtail catheters and femoral sheaths were removed with manual pressure used for hemostasis.   The patient tolerated the procedure well and is transported to the surgical intensive care in stable condition. There were no immediate intraoperative complications. All sponge instrument and needle counts are verified correct at completion of the operation.   No blood products were administered during the operation.  The patient received a total of 80 mL of intravenous contrast during the procedure.   Purcell Nails, MD 02/19/2015 11:14 AM

## 2015-02-19 NOTE — Op Note (Signed)
  [facility]  TAVR OPERATIVE NOTE   Date of Procedure:  02/19/2015  Preoperative Diagnosis: Severe Aortic Stenosis   Postoperative Diagnosis: Same   Procedure:    Transcatheter Aortic Valve Replacement - Transfemoral Approach  Edwards Sapien 3 THV (size 23 mm, model # 9600TFX, serial # [id number])   Co-Surgeons:  Salvatore Decent. Cornelius Moras, MD and Tonny Bollman, MD  Assistants:   Alleen Borne, MD   Anesthesiologist:  Dr Krista Blue  Echocardiographer:  Dr Shirlee Latch  Pre-operative Echo Findings:  Severe aortic stenosis  Normal left ventricular systolic function  Post-operative Echo Findings:  No paravalvular leak  Normal left ventricular systolic function  FOR SURGICAL INDICATION AND OPERATIVE DETAILS, PLEASE SEE THE COMPLETE OPERATIVE REPORT OF DR Cornelius Moras  PERIPHERAL ACCESS:   Using the modified Seldinger technique, femoral arterial and venous access was obtained with placement of 6 Fr sheaths on the left side.  A pigtail diagnostic catheter was passed through the left femoral arterial sheath under fluoroscopic guidance into the aortic root.  A temporary transvenous pacemaker catheter was passed through the left femoral venous sheath under fluoroscopic guidance into the right ventricle.  Caution was taken with passing the pacing wire through the patient's IVC filters. The pacemaker was tested to ensure stable lead placement and pacemaker capture. Aortic root angiography was performed in order to determine the optimal angiographic angle for valve deployment.  TRANSFEMORAL ACCESS:  Right femoral arterial access is obtained with a micropuncture technique under fluoroscopic guidance using a front-wall puncture. Femoral arteriography is performed with the micropuncture sheath in place. Puncture location in the right common femoral artery is confirmed. 2 PerClose sutures are deployed at '10 and 2' positions over a wire and an 8 Fr sheath is introduced into the femoral artery. A 14 Fr  E-Sheath is then introduced over an Amplatz superstiff wire after a 14 Fr dilator is used to pre-dilate the artery.  PROCEDURE COMPLETION:  The sheath was then removed and protamine is administered. Femoral hemostasis is achieved by deploying both PerClose sutures that were initially placed at the onset of the procedure. Digital subtraction angiography is performed to evaluate the aorto-iliac vessels. There was no evidence of dissection, perforation, or other vascular injury in the abdominal aorta, iliac artery, or femoral artery. Tonny Bollman MD 02/19/2015 12:00 PM

## 2015-02-19 NOTE — Progress Notes (Signed)
Patient ID: Ariel Smith, female   DOB: 1945-09-21, 69 y.o.   MRN: [id number]  SICU Evening Rounds:   Hemodynamically stable in sinus rhythm.   Awake and alert.  Sat up some this afternoon.  CBC    Component Value Date/Time   WBC 5.9 02/19/2015 1626   WBC 8.0 01/11/2014 1559   RBC 2.61* 02/19/2015 1626   RBC 2.59* 01/11/2014 1559   HGB 8.5* 02/19/2015 1626   HGB 9.0* 01/11/2014 1559   HGB 9.0 06/02/2013   HCT 27.4* 02/19/2015 1626   HCT 27.0* 01/11/2014 1559   PLT 164 02/19/2015 1626   PLT 149* 01/15/2014 0510   MCV 105.0* 02/19/2015 1626   MCV 105* 01/11/2014 1559   MCV 105.9 06/02/2013   MCH 32.6 02/19/2015 1626   MCH 34.7* 01/11/2014 1559   MCHC 31.0 02/19/2015 1626   MCHC 33.3 01/11/2014 1559   RDW 14.3 02/19/2015 1626   RDW 15.2* 01/11/2014 1559   LYMPHSABS 0.9 01/07/2015 0933   LYMPHSABS 1.1 11/21/2013 0027   MONOABS 0.8 01/07/2015 0933   MONOABS 0.7 11/21/2013 0027   EOSABS 0.2 01/07/2015 0933   EOSABS 0.2 11/21/2013 0027   BASOSABS 0.0 01/07/2015 0933   BASOSABS 0 01/08/2014 0912   BASOSABS 0.0 11/21/2013 0027   BASOSABS 2 09/04/2011 1607     BMET    Component Value Date/Time   NA 142 02/19/2015 1624   NA 142 01/11/2014 1559   NA 141 08/10/2011   K 3.9 02/19/2015 1624   K 3.7 01/11/2014 1559   K 4.3 08/10/2011   CL 97* 02/19/2015 1624   CL 109* 01/11/2014 1559   CO2 27 02/15/2015 1056   CO2 24 01/11/2014 1559   GLUCOSE 88 02/19/2015 1624   GLUCOSE 106* 01/11/2014 1559   BUN 19 02/19/2015 1624   BUN 42* 01/11/2014 1559   CREATININE 5.10* 02/19/2015 1624   CREATININE 6.17* 03/30/2014 1724   CREATININE 6.27* 01/11/2014 1559   CALCIUM 9.7 02/15/2015 1056   CALCIUM 8.5 01/11/2014 1559   CALCIUM 8.4 06/02/2013   GFRNONAA 6* 02/15/2015 1056   GFRNONAA 7* 01/11/2014 1559   GFRNONAA 5* 11/21/2013 0027   GFRAA 7* 02/15/2015 1056   GFRAA 9* 01/11/2014 1559   GFRAA 5* 11/21/2013 0027     A/P:  Stable postop course. Continue current plans

## 2015-02-19 NOTE — Anesthesia Procedure Notes (Addendum)
Procedure Name: Intubation Date/Time: 02/19/2015 8:13 AM Performed by: Ferol Luz L Pre-anesthesia Checklist: Patient identified, Emergency Drugs available, Suction available, Patient being monitored and Timeout performed Patient Re-evaluated:Patient Re-evaluated prior to inductionOxygen Delivery Method: Circle system utilized Preoxygenation: Pre-oxygenation with 100% oxygen Intubation Type: IV induction Ventilation: Mask ventilation without difficulty Grade View: Grade I Tube type: Subglottic suction tube Number of attempts: 1 Airway Equipment and Method: Stylet Placement Confirmation: ETT inserted through vocal cords under direct vision,  positive ETCO2 and breath sounds checked- equal and bilateral Secured at: 21 cm Tube secured with: Tape Dental Injury: Teeth and Oropharynx as per pre-operative assessment       The patient was identified and consent obtained.  TO was performed, and full barrier precautions were used.  The skin was anesthetized with lidocaine.  Once the vein was located with the 22 ga. needle using ultrasound guidance , the wire was inserted into the vein. Unable to pass the wire into the RIJ.  Left IJ was attempted with success using the same technique   The wire location was confirmed with ultrasound.  The insertion site was dilated and the introducer was carefully inserted and sutured in place. The PAC was checked, and floated into the PA.  Once in the PA, the catheter was secured. The patient tolerated the procedure well.  CXR was ordered for PACU. Start: 0702 End: 0714 J. Claybon Jabs, MD

## 2015-02-19 NOTE — Progress Notes (Signed)
Pharmacy: Post-op Antibiotic Adjustment for Renal Function  OBJECTIVE:  SCr 4.6, CrCl<10 ml/min >> pt is ESRD with HD on T/Th/Sat Wt: 96.9 kg  ASSESSMENT:  37 YOF with ESRD-T/Th/Sat who underwent TAVR on 11/1 AM. Post-op antibiotic doses need adjustment for the patient's renal function  Vancomycin 1500 mg IV x 1 given on 11/1 @ 0640 Cefuroxime 1.5g IV x 1 given on 11/1 @ 0820  The patient's Zinacef can be adjusted to q24h. In regards to the Vancomycin - the patient will not require any additional doses unless the patient received HD within the next 24 hours. If so, a maintenance dose of 1g x 1 would be needed at that time.   PLAN:  1. Will adjust post-op Zinacef to 1.5g q24h x 2 doses 2. No additional Vancomycin is needed at this time - will monitor HD plans to determine if additional doses are needed.   Georgina Pillion, PharmD, BCPS Clinical Pharmacist Pager: [phone] 02/19/2015 10:44 AM

## 2015-02-19 NOTE — Progress Notes (Signed)
  Echocardiogram Echocardiogram Transesophageal has been performed.  Janalyn Harder 02/19/2015, 9:44 AM

## 2015-02-19 NOTE — Anesthesia Postprocedure Evaluation (Signed)
Anesthesia Post Note  Patient: Ariel Smith  Procedure(s) Performed: Procedure(s) (LRB): TRANSCATHETER AORTIC VALVE REPLACEMENT, TRANSFEMORAL (Right) TRANSESOPHAGEAL ECHOCARDIOGRAM (TEE) (N/A)  Anesthesia type: General  Patient location: ICU  Post pain: Pain level controlled  Post assessment: Post-op Vital signs reviewed  Last Vitals:  Filed Vitals:   02/19/15 0606  BP: 114/44  Pulse: 93  Temp: 36.4 C  Resp: 18    Post vital signs: stable  Level of consciousness: sedated  Complications: No apparent anesthesia complications

## 2015-02-20 ENCOUNTER — Inpatient Hospital Stay (HOSPITAL_COMMUNITY): Payer: Medicare Other

## 2015-02-20 ENCOUNTER — Encounter (HOSPITAL_COMMUNITY): Payer: Self-pay | Admitting: Thoracic Surgery (Cardiothoracic Vascular Surgery)

## 2015-02-20 ENCOUNTER — Other Ambulatory Visit: Payer: Self-pay

## 2015-02-20 DIAGNOSIS — I5033 Acute on chronic diastolic (congestive) heart failure: Secondary | ICD-10-CM

## 2015-02-20 DIAGNOSIS — Z952 Presence of prosthetic heart valve: Secondary | ICD-10-CM

## 2015-02-20 DIAGNOSIS — I10 Essential (primary) hypertension: Secondary | ICD-10-CM

## 2015-02-20 DIAGNOSIS — I350 Nonrheumatic aortic (valve) stenosis: Secondary | ICD-10-CM

## 2015-02-20 LAB — GLUCOSE, CAPILLARY
GLUCOSE-CAPILLARY: 92 mg/dL (ref 65–99)
GLUCOSE-CAPILLARY: 95 mg/dL (ref 65–99)
GLUCOSE-CAPILLARY: 98 mg/dL (ref 65–99)
Glucose-Capillary: 88 mg/dL (ref 65–99)
Glucose-Capillary: 112 mg/dL — ABNORMAL HIGH (ref 65–99)
Glucose-Capillary: 86 mg/dL (ref 65–99)

## 2015-02-20 LAB — CBC
HCT: 26.7 % — ABNORMAL LOW (ref 36.0–46.0)
HCT: 27.8 % — ABNORMAL LOW (ref 36.0–46.0)
Hemoglobin: 8.4 g/dL — ABNORMAL LOW (ref 12.0–15.0)
Hemoglobin: 8.7 g/dL — ABNORMAL LOW (ref 12.0–15.0)
MCH: 33.3 pg (ref 26.0–34.0)
MCH: 33.1 pg (ref 26.0–34.0)
MCHC: 31.5 g/dL (ref 30.0–36.0)
MCHC: 31.3 g/dL (ref 30.0–36.0)
MCV: 106 fL — ABNORMAL HIGH (ref 78.0–100.0)
MCV: 105.7 fL — ABNORMAL HIGH (ref 78.0–100.0)
PLATELETS: 157 10*3/uL (ref 150–400)
PLATELETS: 140 10*3/uL — AB (ref 150–400)
RBC: 2.52 MIL/uL — AB (ref 3.87–5.11)
RBC: 2.63 MIL/uL — ABNORMAL LOW (ref 3.87–5.11)
RDW: 14.3 % (ref 11.5–15.5)
RDW: 14 % (ref 11.5–15.5)
WBC: 5 10*3/uL (ref 4.0–10.5)
WBC: 6.1 10*3/uL (ref 4.0–10.5)

## 2015-02-20 LAB — BASIC METABOLIC PANEL
Anion gap: 13 (ref 5–15)
BUN: 22 mg/dL — AB (ref 6–20)
CO2: 30 mmol/L (ref 22–32)
CREATININE: 6.33 mg/dL — AB (ref 0.44–1.00)
Calcium: 9.4 mg/dL (ref 8.9–10.3)
Chloride: 99 mmol/L — ABNORMAL LOW (ref 101–111)
GFR calc Af Amer: 7 mL/min — ABNORMAL LOW (ref >60)
GFR, EST NON AFRICAN AMERICAN: 6 mL/min — AB (ref >60)
Glucose, Bld: 80 mg/dL (ref 65–99)
Potassium: 4.2 mmol/L (ref 3.5–5.1)
SODIUM: 142 mmol/L (ref 135–145)

## 2015-02-20 LAB — MAGNESIUM
MAGNESIUM: 1.7 mg/dL (ref 1.7–2.4)
Magnesium: 1.6 mg/dL — ABNORMAL LOW (ref 1.7–2.4)

## 2015-02-20 MED ORDER — DARBEPOETIN ALFA 60 MCG/0.3ML IJ SOSY
60.0000 ug | PREFILLED_SYRINGE | INTRAMUSCULAR | Status: DC
  Administered 2015-02-21: 60 ug via INTRAVENOUS
  Filled 2015-02-20: qty 0.3

## 2015-02-20 MED ORDER — CYANOCOBALAMIN 1000 MCG/ML IJ SOLN: 1000.0000 ug | INTRAMUSCULAR | Status: DC

## 2015-02-20 MED ORDER — INSULIN ASPART 100 UNIT/ML ~~LOC~~ SOLN: 0.0000 [IU] | SUBCUTANEOUS | Status: DC

## 2015-02-20 MED ORDER — SODIUM CHLORIDE 0.9 % IV SOLN: 250.0000 mL | INTRAVENOUS | Status: DC | PRN

## 2015-02-20 MED ORDER — DOXERCALCIFEROL 4 MCG/2ML IV SOLN
1.5000 ug | INTRAVENOUS | Status: DC
  Administered 2015-02-21: 1.5 ug via INTRAVENOUS
  Filled 2015-02-20: qty 2

## 2015-02-20 MED ORDER — CITALOPRAM HYDROBROMIDE 20 MG PO TABS
20.0000 mg | ORAL_TABLET | Freq: Every day | ORAL | Status: DC
  Administered 2015-02-20 – 2015-02-21 (×2): 20 mg via ORAL
  Filled 2015-02-20 (×2): qty 1

## 2015-02-20 MED ORDER — ALBUTEROL SULFATE HFA 108 (90 BASE) MCG/ACT IN AERS: 2.0000 | INHALATION_SPRAY | RESPIRATORY_TRACT | Status: DC | PRN

## 2015-02-20 MED ORDER — ZOLPIDEM TARTRATE 5 MG PO TABS: 5.0000 mg | ORAL_TABLET | Freq: Every evening | ORAL | Status: DC | PRN

## 2015-02-20 MED ORDER — INSULIN ASPART 100 UNIT/ML ~~LOC~~ SOLN: 0.0000 [IU] | Freq: Three times a day (TID) | SUBCUTANEOUS | Status: DC

## 2015-02-20 MED ORDER — DONEPEZIL HCL 5 MG PO TABS
5.0000 mg | ORAL_TABLET | Freq: Every day | ORAL | Status: DC
  Administered 2015-02-20 – 2015-02-21 (×2): 5 mg via ORAL
  Filled 2015-02-20 (×3): qty 1

## 2015-02-20 MED ORDER — CALCIUM ACETATE (PHOS BINDER) 667 MG PO CAPS
2001.0000 mg | ORAL_CAPSULE | Freq: Three times a day (TID) | ORAL | Status: DC
  Administered 2015-02-20 – 2015-02-21 (×5): 2001 mg via ORAL
  Filled 2015-02-20 (×6): qty 3

## 2015-02-20 MED ORDER — ACETAMINOPHEN 500 MG PO TABS: 1000.0000 mg | ORAL_TABLET | Freq: Four times a day (QID) | ORAL | Status: DC | PRN

## 2015-02-20 MED ORDER — BUDESONIDE-FORMOTEROL FUMARATE 160-4.5 MCG/ACT IN AERO
2.0000 | INHALATION_SPRAY | Freq: Two times a day (BID) | RESPIRATORY_TRACT | Status: DC
  Administered 2015-02-20 – 2015-02-22 (×5): 2 via RESPIRATORY_TRACT
  Filled 2015-02-20: qty 6

## 2015-02-20 MED ORDER — CALCITRIOL 0.25 MCG PO CAPS
0.2500 ug | ORAL_CAPSULE | Freq: Every day | ORAL | Status: DC
  Filled 2015-02-20: qty 1

## 2015-02-20 MED ORDER — SODIUM CHLORIDE 0.9 % IJ SOLN
3.0000 mL | Freq: Two times a day (BID) | INTRAMUSCULAR | Status: DC
  Administered 2015-02-21: 3 mL via INTRAVENOUS

## 2015-02-20 MED ORDER — MOVING RIGHT ALONG BOOK
Freq: Once | Status: AC
  Administered 2015-02-20: 09:00:00
  Filled 2015-02-20: qty 1

## 2015-02-20 MED ORDER — ALBUTEROL SULFATE (2.5 MG/3ML) 0.083% IN NEBU: 2.5000 mg | INHALATION_SOLUTION | RESPIRATORY_TRACT | Status: DC | PRN

## 2015-02-20 MED ORDER — ALPRAZOLAM 0.25 MG PO TABS
0.2500 mg | ORAL_TABLET | Freq: Three times a day (TID) | ORAL | Status: DC | PRN
  Administered 2015-02-20: 0.25 mg via ORAL
  Filled 2015-02-20: qty 1

## 2015-02-20 MED ORDER — BACLOFEN 10 MG PO TABS
5.0000 mg | ORAL_TABLET | Freq: Two times a day (BID) | ORAL | Status: DC | PRN
  Filled 2015-02-20: qty 1

## 2015-02-20 MED ORDER — TRAZODONE HCL 50 MG PO TABS
25.0000 mg | ORAL_TABLET | Freq: Every day | ORAL | Status: DC
  Administered 2015-02-20 – 2015-02-21 (×2): 25 mg via ORAL
  Filled 2015-02-20 (×3): qty 1

## 2015-02-20 MED ORDER — SODIUM CHLORIDE 0.9 % IJ SOLN: 3.0000 mL | INTRAMUSCULAR | Status: DC | PRN

## 2015-02-20 NOTE — Progress Notes (Signed)
    Subjective:  Patient feels well. She has just arrived on [address]. She is already asking to go home. Denies chest pain or shortness of breath.  Objective:  Vital Signs in the last 24 hours: Temp:  [94.6 F (34.8 C)-99.2 F (37.3 C)] 98 F (36.7 C) (11/02 1051) Pulse Rate:  [80-98] 90 (11/02 1051) Resp:  [12-22] 18 (11/02 1051) BP: (72-143)/(53-81) 143/69 mmHg (11/02 1051) SpO2:  [91 %-100 %] 95 % (11/02 1051) Arterial Line BP: (92-156)/(39-71) 109/45 mmHg (11/02 0700) Weight:  [216 lb 11.4 oz (98.3 kg)] 216 lb 11.4 oz (98.3 kg) (11/02 0527)  Intake/Output from previous day: 11/01 0701 - 11/02 0700 In: 1757.4 [P.O.:240; I.V.:1267.4; IV Piggyback:250] Out: 30 [Urine:30]  Physical Exam: Pt is alert and oriented, NAD HEENT: normal Neck: JVP - normal Lungs: CTA bilaterally CV: RRR with 2/6 systolic ejection murmur at the left sternal border Abd: soft, NT, Positive BS, no hepatomegaly Ext: no C/C/E, groin sites are clear Skin: warm/dry no rash   Lab Results:  Recent Labs  02/19/15 1626 02/20/15 0543  WBC 5.9 5.0  HGB 8.5* 8.4*  PLT 164 157    Recent Labs  02/19/15 1624 02/20/15 0543  NA 142 142  K 3.9 4.2  CL 97* 99*  CO2  --  30  GLUCOSE 88 80  BUN 19 22*  CREATININE 5.10* 6.33*   No results for input(s): TROPONINI in the last 72 hours.  Invalid input(s): CK, MB  Cardiac Studies: 2-D echocardiogram pending  Tele: Personally reviewed: Normal sinus rhythm  Assessment/Plan:  1. Acute on chronic diastolic CHF: NYHA III.  2. Severe AS POD#1 from TAVR 3. ESRD 4. Anemia, chronic, essentially unchanged postoperatively 5. COPD, O2 dependent  Pt progressing very well after TAVR. Await 2D echo. Pt on ASA and plavix. Cardiac rehab consult placed. Plans for dialysis tomorrow as no evidence of volume overload on exam.  Tonny Bollman, M.D. 02/20/2015, 11:01 AM

## 2015-02-20 NOTE — Progress Notes (Signed)
Pt walked over from 2S earlier then just walked with PT. Will f/u tomorrow. Ethelda Chick CES, ACSM 1:15 PM  02/20/2015

## 2015-02-20 NOTE — Progress Notes (Signed)
Admit: 02/19/2015 LOS: 1  35F ESRD THS  [facility] s/p TAVF 02/19/15  Subjective:  No new events Off pressors OOB to Chair SGC removed 4L [place] O2   11/01 0701 - 11/02 0700 In: 1757.4 [P.O.:240; I.V.:1267.4; IV Piggyback:250] Out: 30 [Urine:30]  Filed Weights   02/19/15 0606 02/20/15 0527  Weight: 96.888 kg (213 lb 9.6 oz) 98.3 kg (216 lb 11.4 oz)    Scheduled Meds: . acetaminophen (TYLENOL) oral liquid 160 mg/5 mL  650 mg Per Tube Once  . acetaminophen  1,000 mg Oral 4 times per day  . aspirin EC  325 mg Oral Daily  . budesonide-formoterol  2 puff Inhalation BID  . calcitRIOL  0.25 mcg Oral Daily  . calcium acetate  2,001 mg Oral TID WC  . cefUROXime (ZINACEF)  IV  1.5 g Intravenous Q24H  . citalopram  20 mg Oral Daily  . clopidogrel  75 mg Oral Q breakfast  . [START ON 03/05/2015] cyanocobalamin  1,000 mcg Intramuscular Q30 days  . donepezil  5 mg Oral QHS  . famotidine (PEPCID) IV  20 mg Intravenous Q24 Hr x 2  . insulin aspart  0-24 Units Subcutaneous 6 times per day  . metoprolol tartrate  12.5 mg Oral BID  . [START ON 02/21/2015] pantoprazole  40 mg Oral Daily  . sodium chloride  3 mL Intravenous Q12H   Continuous Infusions: . phenylephrine (NEO-SYNEPHRINE) Adult infusion Stopped (02/19/15 1749)   PRN Meds:.sodium chloride, acetaminophen, albumin human, albuterol, baclofen, metoprolol, ondansetron (ZOFRAN) IV, oxyCODONE, sodium chloride  Current Labs: reviewed    Physical Exam:  Blood pressure 107/70, pulse 85, temperature 98.8 F (37.1 C), temperature source Oral, resp. rate 14, height 5\' 4"  (1.626 m), weight 98.3 kg (216 lb 11.4 oz), SpO2 93 %. GEN: NAD, resting ENT: NCAT EYES: EOMI CV: RRR, nl s1s2 PULM: ctab ABD: s/nt/nd SKIN: no rashes/lesions EXT:No LEE AVF: LFA AVF+B/T  Outpt HD Orders Unit: [facility] Days: [facility]S Time: 3h25min Dialyzer: Elisio 15 EDW: 96kg K/Ca: 2/2.5 Access: AVF Needle Size: 15g  BFR/DFR: 450/600  VDRA: Hectorol  1.5qTx EPO: 3800 IU qTx IV Fe: Venofer 50qWk Heparin: none Most Recent Phos / PTH:   A/P 1. ESRD: THS [facility]  1. Last HD 10/31 2. Currently no acute HD indications 3. Unless strong concern for surgery, next HD planned tomorrow and return to outpt schedule 2. 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 HTN/Vol: as above, keep outpt EDW right now, BP stable 4. Anemia: Hb stable, monitor 1. Next HD will give aranesp 60 5. MBD:  1. Cont Binder 2. Hectorolo with HD, stop PO VDRA  Sabra Heck MD 02/20/2015, 8:04 AM   Recent Labs Lab 02/15/15 1056  02/19/15 1039 02/19/15 1624 02/20/15 0543  NA 136  < > 141 142 142  K 3.9  < > 2.9* 3.9 4.2  CL 95*  < > 95* 97* 99*  CO2 27  --   --   --  30  GLUCOSE 140*  < > 122* 88 80  BUN 26*  < > 16 19 22*  CREATININE 6.04*  < > 4.40* 5.10* 6.33*  CALCIUM 9.7  --   --   --  9.4  < > = values in this interval not displayed.  Recent Labs Lab 02/19/15 1051 02/19/15 1624 02/19/15 1626 02/20/15 0543  WBC 6.7  --  5.9 5.0  HGB 8.2* 8.8* 8.5* 8.4*  HCT 25.7* 26.0* 27.4* 26.7*  MCV 106.2*  --  105.0*  106.0*  PLT 143*  --  164 157

## 2015-02-20 NOTE — Progress Notes (Signed)
[address]       [place] [address]             [phone]        CARDIOTHORACIC SURGERY PROGRESS NOTE   R1 Day Post-Op Procedure(s) (LRB): TRANSCATHETER AORTIC VALVE REPLACEMENT, TRANSFEMORAL (Right) TRANSESOPHAGEAL ECHOCARDIOGRAM (TEE) (N/A)  Subjective: Looks good and feels well.  Hungry for breakfast.  No SOB  Objective: Vital signs: BP Readings from Last 1 Encounters:  02/20/15 107/70   Pulse Readings from Last 1 Encounters:  02/20/15 85   Resp Readings from Last 1 Encounters:  02/20/15 14   Temp Readings from Last 1 Encounters:  02/20/15 97.7 F (36.5 C) Oral    Hemodynamics: PAP: (26-38)/(5-17) 31/13 mmHg CO:  [6.8 L/min-8.7 L/min] 6.8 L/min CI:  [3.4 L/min/m2-4.3 L/min/m2] 3.4 L/min/m2  Physical Exam:  Rhythm:   sinus  Breath sounds: clear  Heart sounds:  RRR w/out murmur  Incisions:  Clean and dry  Abdomen:  Soft, non-distended, non-tender  Extremities:  Warm, well-perfused   Intake/Output from previous day: 11/01 0701 - 11/02 0700 In: 1757.4 [P.O.:240; I.V.:1267.4; IV Piggyback:250] Out: 30 [Urine:30] Intake/Output this shift:    Lab Results:  CBC: Recent Labs  02/19/15 1626 02/20/15 0543  WBC 5.9 5.0  HGB 8.5* 8.4*  HCT 27.4* 26.7*  PLT 164 157    BMET:  Recent Labs  02/19/15 1624 02/20/15 0543  NA 142 142  K 3.9 4.2  CL 97* 99*  CO2  --  30  GLUCOSE 88 80  BUN 19 22*  CREATININE 5.10* 6.33*  CALCIUM  --  9.4     PT/INR:   Recent Labs  02/19/15 1051  LABPROT 15.9*  INR 1.26    CBG (last 3)   Recent Labs  02/19/15 1930 02/19/15 2332 02/20/15 0402  GLUCAP 72 95 88    ABG    Component Value Date/Time   PHART 7.354 02/19/2015 1040   PCO2ART 59.4* 02/19/2015 1040   PO2ART 67.0* 02/19/2015 1040   HCO3 33.8* 02/19/2015 1040   TCO2 35 02/19/2015 1624   O2SAT 93.0 02/19/2015 1040    EKG: NSR w/out acute ischemic changes or AV block   CXR: PORTABLE CHEST 1 VIEW  COMPARISON:  02/19/2015. CT 01/25/2015.  FINDINGS: Left IJ sheath in stable position. Interim removal of Swan-Ganz catheter. Aortic valve replacement. Cardiomegaly with normal pulmonary vascularity. Low lung volumes with mild bibasilar atelectasis and/or infiltrates. Small left pleural effusion cannot be excluded. No pneumothorax.  IMPRESSION: 1. Interim removal of Swan-Ganz catheter. Left IJ line stable position. 2. Prior aortic valve replacement. Stable cardiomegaly. No pulmonary venous congestion. 3. Lung volumes with bibasilar atelectasis and/or infiltrates again noted. Small left pleural effusion cannot be excluded.   Electronically Signed  By: Maisie Fus Register  On: 02/20/2015 07:33  Assessment/Plan: S/P Procedure(s) (LRB): TRANSCATHETER AORTIC VALVE REPLACEMENT, TRANSFEMORAL (Right) TRANSESOPHAGEAL ECHOCARDIOGRAM (TEE) (N/A)  Doing very well POD1 TAVR Expected post op acute blood loss anemia with pre-op chronic anemia, stable Expected post op atelectasis, mild Chronic diastolic CHF with expected post-op volume excess, mild Dialysis-dependent CKD stage V - electrolytes okay COPD Chronic respiratory failure Morbid obesity Physical deconditioning Hypertension   Dialysis per Nephrology team  Mobilize  PT consult  ASA + Plavix  Restart home meds  Routine POD1 ECHO  Transfer stepdown  Anticipate possible d/c home with family 2-3 days  May need home PT  I spent in excess of 15 minutes during the conduct of this  hospital encounter and >50% of this time involved direct face-to-face encounter with the patient for counseling and/or coordination of their care.   Purcell Nails, MD 02/20/2015 8:38 AM

## 2015-02-20 NOTE — Progress Notes (Signed)
Nursing note Patient ambulated from 2 south to 2w20, pt in chair, telemetry applied and called to CCMD, call bell within reach will monitor patient. Stayce Delancy, Randall An RN

## 2015-02-20 NOTE — Progress Notes (Signed)
Patient transferred to 2W20, belongings at bedside, SCDs at bedside, patient up to chair with call bell within reach, tele monitor on, receiving RN in room, no further questions from patient or receiving RN.  Hermina Barters, RN

## 2015-02-20 NOTE — Care Management Note (Signed)
Case Management Note  Patient Details  Name: YURIDIANA APPLETON MRN: [id number] Date of Birth: 05/29/1945  Subjective/Objective:        Patient lives at home with husband and was independent prior to admission.  Husband is able to care for her - already cooks for her. Has oxygen at home with [employer] - Has rolling walker, and shower chair. Plan to go home with husband who will be with her 24/7.  CM will continue to follow.             Action/Plan:   Expected Discharge Date:                  Expected Discharge Plan:  Home/Self Care  In-House Referral:     Discharge planning Services  CM Consult  Post Acute Care Choice:    Choice offered to:     DME Arranged:    DME Agency:     HH Arranged:    HH Agency:     Status of Service:  In process, will continue to follow  Medicare Important Message Given:    Date Medicare IM Given:    Medicare IM give by:    Date Additional Medicare IM Given:    Additional Medicare Important Message give by:     If discussed at Long Length of Stay Meetings, dates discussed:    Additional Comments:  Vangie Bicker, RN 02/20/2015, 8:51 AM

## 2015-02-20 NOTE — Evaluation (Signed)
Physical Therapy Evaluation Patient Details Name: BREANCA KILCREASE MRN: [id number] DOB: 1946-04-08 Today's Date: 02/20/2015   History of Present Illness  patient is a 69 y/o female presents with aortic stenosis s/p TAVR. 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  Clinical Impression  Patient presents with generalized weakness and deconditioning s/p above surgery impacting mobility. Tolerated ambulation and transfers with Min guard-supervision for safety. Education re: bracing when coughing/sneezing using heart pillow. Pt will have support from spouse at home. Will follow acutely to maximize independence and mobility prior to return home.     Follow Up Recommendations Home health PT;Supervision for mobility/OOB    Equipment Recommendations  None recommended by PT    Recommendations for Other Services       Precautions / Restrictions Precautions Precautions: Fall Restrictions Weight Bearing Restrictions: No      Mobility  Bed Mobility               General bed mobility comments: Sitting in chair upon PT arrival.   Transfers Overall transfer level: Needs assistance Equipment used: None Transfers: Sit to/from Stand Sit to Stand: Supervision         General transfer comment: Supervision for safety.   Ambulation/Gait Ambulation/Gait assistance: Supervision Ambulation Distance (Feet): 150 Feet Assistive device: Rolling walker (2 wheeled) Gait Pattern/deviations: Step-through pattern;Decreased stride length Gait velocity: decreased   General Gait Details: Steady gait using RW. No dyspnea. HR stable.  Stairs            Wheelchair Mobility    Modified Rankin (Stroke Patients Only)       Balance Overall balance assessment: Needs assistance Sitting-balance support: Feet supported;No upper extremity supported Sitting balance-Leahy  Scale: Good     Standing balance support: During functional activity Standing balance-Leahy Scale: Fair Standing balance comment: Able to stand unsupported for short periods, requires UE support for dynamic standing.                             Pertinent Vitals/Pain Pain Assessment: No/denies pain    Home Living Family/patient expects to be discharged to:: Private residence Living Arrangements: Spouse/significant other Available Help at Discharge: Family;Available 24 hours/day Type of Home: House Home Access: Ramped entrance     Home Layout: One level Home Equipment: Cane - single point;Walker - 2 wheels;Walker - 4 wheels;Shower seat;Bedside commode Additional Comments: Home O2 (3L at baseline)    Prior Function Level of Independence: Independent with assistive device(s)         Comments: Independent with household amb vs SPC and rollator for community ambulation. Husband transports to/from dialysis.     Hand Dominance        Extremity/Trunk Assessment   Upper Extremity Assessment: Defer to OT evaluation           Lower Extremity Assessment: Generalized weakness         Communication   Communication: No difficulties  Cognition Arousal/Alertness: Awake/alert Behavior During Therapy: WFL for tasks assessed/performed Overall Cognitive Status: Within Functional Limits for tasks assessed                      General Comments General comments (skin integrity, edema, etc.): Spouse present during session.    Exercises        Assessment/Plan    PT Assessment Patient  needs continued PT services  PT Diagnosis Difficulty walking;Generalized weakness   PT Problem List Decreased strength;Cardiopulmonary status limiting activity;Decreased balance;Decreased mobility;Decreased activity tolerance  PT Treatment Interventions Balance training;Gait training;Functional mobility training;Therapeutic activities;Therapeutic exercise;Patient/family  education   PT Goals (Current goals can be found in the Care Plan section) Acute Rehab PT Goals Patient Stated Goal: to go home tomorrow PT Goal Formulation: With patient Time For Goal Achievement: 03/06/15 Potential to Achieve Goals: Good    Frequency Min 3X/week   Barriers to discharge        Co-evaluation               End of Session Equipment Utilized During Treatment: Oxygen Activity Tolerance: Patient tolerated treatment well Patient left: in chair;with call bell/phone within reach;with family/visitor present Nurse Communication: Mobility status         Time: 1610-9604 PT Time Calculation (min) (ACUTE ONLY): 16 min   Charges:   PT Evaluation $Initial PT Evaluation Tier I: 1 Procedure     PT G Codes:        Zamere Pasternak A Daymion Nazaire 02/20/2015, 1:27 PM Mylo Red, PT, DPT [phone]

## 2015-02-20 NOTE — Progress Notes (Signed)
  Echocardiogram 2D Echocardiogram has been performed.  Ariel Smith 02/20/2015, 3:53 PM

## 2015-02-21 ENCOUNTER — Inpatient Hospital Stay (HOSPITAL_COMMUNITY): Payer: Medicare Other

## 2015-02-21 LAB — BASIC METABOLIC PANEL
ANION GAP: 11 (ref 5–15)
BUN: 35 mg/dL — ABNORMAL HIGH (ref 6–20)
CHLORIDE: 98 mmol/L — AB (ref 101–111)
CO2: 30 mmol/L (ref 22–32)
Calcium: 9.9 mg/dL (ref 8.9–10.3)
Creatinine, Ser: 7.82 mg/dL — ABNORMAL HIGH (ref 0.44–1.00)
GFR calc Af Amer: 5 mL/min — ABNORMAL LOW (ref >60)
GFR calc non Af Amer: 5 mL/min — ABNORMAL LOW (ref >60)
GLUCOSE: 102 mg/dL — AB (ref 65–99)
POTASSIUM: 4.2 mmol/L (ref 3.5–5.1)
Sodium: 139 mmol/L (ref 135–145)

## 2015-02-21 LAB — GLUCOSE, CAPILLARY
GLUCOSE-CAPILLARY: 116 mg/dL — AB (ref 65–99)
GLUCOSE-CAPILLARY: 100 mg/dL — AB (ref 65–99)
GLUCOSE-CAPILLARY: 116 mg/dL — AB (ref 65–99)
Glucose-Capillary: 113 mg/dL — ABNORMAL HIGH (ref 65–99)

## 2015-02-21 LAB — CBC
HEMATOCRIT: 26.6 % — AB (ref 36.0–46.0)
HEMOGLOBIN: 8.3 g/dL — AB (ref 12.0–15.0)
MCH: 32.9 pg (ref 26.0–34.0)
MCHC: 31.2 g/dL (ref 30.0–36.0)
MCV: 105.6 fL — ABNORMAL HIGH (ref 78.0–100.0)
Platelets: 131 10*3/uL — ABNORMAL LOW (ref 150–400)
RBC: 2.52 MIL/uL — ABNORMAL LOW (ref 3.87–5.11)
RDW: 13.9 % (ref 11.5–15.5)
WBC: 5.1 10*3/uL (ref 4.0–10.5)

## 2015-02-21 MED ORDER — SODIUM CHLORIDE 0.9 % IV SOLN: 100.0000 mL | INTRAVENOUS | Status: DC | PRN

## 2015-02-21 MED ORDER — ALTEPLASE 2 MG IJ SOLR
2.0000 mg | Freq: Once | INTRAMUSCULAR | Status: DC | PRN
  Filled 2015-02-21: qty 2

## 2015-02-21 MED ORDER — DOXERCALCIFEROL 4 MCG/2ML IV SOLN
INTRAVENOUS | Status: AC
  Filled 2015-02-21: qty 2

## 2015-02-21 MED ORDER — PENTAFLUOROPROP-TETRAFLUOROETH EX AERO: 1.0000 "application " | INHALATION_SPRAY | CUTANEOUS | Status: DC | PRN

## 2015-02-21 MED ORDER — BIOTENE DRY MOUTH MT LIQD: 15.0000 mL | OROMUCOSAL | Status: DC | PRN

## 2015-02-21 MED ORDER — LIDOCAINE HCL (PF) 1 % IJ SOLN: 5.0000 mL | INTRAMUSCULAR | Status: DC | PRN

## 2015-02-21 MED ORDER — HEPARIN SODIUM (PORCINE) 1000 UNIT/ML DIALYSIS: 1000.0000 [IU] | INTRAMUSCULAR | Status: DC | PRN

## 2015-02-21 MED ORDER — LIDOCAINE-PRILOCAINE 2.5-2.5 % EX CREA: 1.0000 "application " | TOPICAL_CREAM | CUTANEOUS | Status: DC | PRN

## 2015-02-21 MED ORDER — DARBEPOETIN ALFA 60 MCG/0.3ML IJ SOSY
PREFILLED_SYRINGE | INTRAMUSCULAR | Status: AC
Start: 2015-02-21 — End: 2015-02-21
  Filled 2015-02-21: qty 0.3

## 2015-02-21 NOTE — Progress Notes (Addendum)
[address]       [name] [address]             [phone]      2 Days Post-Op Procedure(s) (LRB): TRANSCATHETER AORTIC VALVE REPLACEMENT, TRANSFEMORAL (Right) TRANSESOPHAGEAL ECHOCARDIOGRAM (TEE) (N/A) Subjective: Feels pretty well  Objective: Vital signs in last 24 hours: Temp:  [97.7 F (36.5 C)-98.6 F (37 C)] 98.5 F (36.9 C) (11/03 0332) Pulse Rate:  [80-90] 80 (11/03 0332) Cardiac Rhythm:  [-] Heart block (11/02 1924) Resp:  [13-18] 18 (11/03 0332) BP: (110-153)/(50-69) 134/50 mmHg (11/03 0332) SpO2:  [92 %-100 %] 95 % (11/03 0332) FiO2 (%):  [3.5 %] 3.5 % (11/02 1345) Weight:  [218 lb 14.4 oz (99.292 kg)] 218 lb 14.4 oz (99.292 kg) (11/03 0332)  Hemodynamic parameters for last 24 hours:    Intake/Output from previous day: 11/02 0701 - 11/03 0700 In: 790 [P.O.:720; I.V.:20; IV Piggyback:50] Out: 100 [Urine:100] Intake/Output this shift:    General appearance: alert, cooperative and no distress Heart: regular rate and rhythm Lungs: min dim in bases Abdomen: benign Extremities: minor edema Wound: groins with out hematoma or erethema  Lab Results:  Recent Labs  02/20/15 1636 02/21/15 0331  WBC 6.1 5.1  HGB 8.7* 8.3*  HCT 27.8* 26.6*  PLT 140* 131*   BMET:  Recent Labs  02/20/15 0543 02/21/15 0331  NA 142 139  K 4.2 4.2  CL 99* 98*  CO2 30 30  GLUCOSE 80 102*  BUN 22* 35*  CREATININE 6.33* 7.82*  CALCIUM 9.4 9.9    PT/INR:  Recent Labs  02/19/15 1051  LABPROT 15.9*  INR 1.26   ABG    Component Value Date/Time   PHART 7.354 02/19/2015 1040   HCO3 33.8* 02/19/2015 1040   TCO2 35 02/19/2015 1624   O2SAT 93.0 02/19/2015 1040   CBG (last 3)   Recent Labs  02/20/15 1626 02/20/15 2236 02/21/15 0635  GLUCAP 92 98 116*    Meds Scheduled Meds: . acetaminophen  1,000 mg Oral 4 times per day  . aspirin EC  325 mg Oral Daily  . budesonide-formoterol  2 puff Inhalation BID  . calcium acetate  2,001 mg  Oral TID WC  . cefUROXime (ZINACEF)  IV  1.5 g Intravenous Q24H  . citalopram  20 mg Oral Daily  . clopidogrel  75 mg Oral Q breakfast  . [START ON 03/05/2015] cyanocobalamin  1,000 mcg Intramuscular Q30 days  . darbepoetin (ARANESP) injection - DIALYSIS  60 mcg Intravenous Q Thu-HD  . donepezil  5 mg Oral QHS  . doxercalciferol  1.5 mcg Intravenous Q T,Th,Sa-HD  . insulin aspart  0-24 Units Subcutaneous TID AC & HS  . metoprolol tartrate  12.5 mg Oral BID  . pantoprazole  40 mg Oral Daily  . sodium chloride  3 mL Intravenous Q12H  . sodium chloride  3 mL Intravenous Q12H  . traZODone  25 mg Oral QHS   Continuous Infusions:  PRN Meds:.sodium chloride, sodium chloride, acetaminophen, albuterol, ALPRAZolam, baclofen, metoprolol, ondansetron (ZOFRAN) IV, oxyCODONE, sodium chloride, sodium chloride, zolpidem  Xrays Dg Chest 2 View  02/21/2015  CLINICAL DATA:  Status post aortic valve repair EXAM: CHEST  2 VIEW COMPARISON:  02/20/2015 FINDINGS: The heart size is mildly enlarged. No pleural effusion or overt edema. Pulmonary vascular congestion noted. No airspace consolidation. IMPRESSION: 1. Cardiac enlargement and pulmonary vascular congestion. No frank edema or effusion. Electronically Signed   By: Signa Kell  M.D.   On: 02/21/2015 07:46   Dg Chest Port 1 View  02/20/2015  CLINICAL DATA:  Aortic valve replacement. EXAM: PORTABLE CHEST 1 VIEW COMPARISON:  02/19/2015.  CT 01/25/2015. FINDINGS: Left IJ sheath in stable position. Interim removal of Swan-Ganz catheter. Aortic valve replacement. Cardiomegaly with normal pulmonary vascularity. Low lung volumes with mild bibasilar atelectasis and/or infiltrates. Small left pleural effusion cannot be excluded. No pneumothorax. IMPRESSION: 1. Interim removal of Swan-Ganz catheter. Left IJ line stable position. 2. Prior aortic valve replacement. Stable cardiomegaly. No pulmonary venous congestion. 3. Lung volumes with bibasilar atelectasis and/or  infiltrates again noted. Small left pleural effusion cannot be excluded. Electronically Signed   By: Maisie Fus  Register   On: 02/20/2015 07:33   Dg Chest Port 1 View  02/19/2015  CLINICAL DATA:  Congestive heart failure. EXAM: PORTABLE CHEST 1 VIEW COMPARISON:  02/15/2015 FINDINGS: Changes of aortic valve repair. Swan-Ganz catheter tip is in the right lower lobe pulmonary artery. Cardiomegaly with vascular congestion. Interstitial prominence may reflect interstitial edema. No pneumothorax or visible effusions. IMPRESSION: Status post aortic valve repair. Mild interstitial prominence likely reflects interstitial edema. Electronically Signed   By: Charlett Nose M.D.   On: 02/19/2015 11:54    Assessment/Plan: S/P Procedure(s) (LRB): TRANSCATHETER AORTIC VALVE REPLACEMENT, TRANSFEMORAL (Right) TRANSESOPHAGEAL ECHOCARDIOGRAM (TEE) (N/A)  1 doing well, echo pending 2 dialysis this am 3 is on home O2 4 routine rehab/PT 5 poss home 1-2 days  LOS: 2 days    GOLD,WAYNE E 02/21/2015  I have seen and examined the patient and agree with the assessment and plan as outlined.  Tentatively plan d/c home in the am tomorrow.  I spent in excess of 15 minutes during the conduct of this hospital encounter and >50% of this time involved direct face-to-face encounter with the patient for counseling and/or coordination of their care.   Purcell Nails, MD 02/21/2015 6:58 PM

## 2015-02-21 NOTE — Progress Notes (Signed)
Pt arrived back to unit from dialysis.  BP 112/60 pulse 80.  Pt denies pain.  Pt stable.  No s/s of distress.  Will cont to monitor.

## 2015-02-21 NOTE — Progress Notes (Signed)
PT Cancellation Note  Patient Details Name: Ariel Smith MRN: [id number] DOB: 1945-08-27   Cancelled Treatment:    Reason Eval/Treat Not Completed: Patient at procedure or test/unavailable;Other (comment) (In HD on arrival).  Will see 11/4 as able. 02/21/2015  [place] Bing, PT [phone]  (pager)   Alijah Hyde, Eliseo Gum 02/21/2015, 1:54 PM

## 2015-02-21 NOTE — Progress Notes (Signed)
Admit: 02/19/2015 LOS: 2  49F ESRD [facility] s/p TAVF 02/19/15  Subjective:  Doing well, no new issues  11/02 0701 - 11/03 0700 In: 790 [P.O.:720; I.V.:20; IV Piggyback:50] Out: 100 [Urine:100]  Filed Weights   02/19/15 0606 02/20/15 0527 02/21/15 0332  Weight: 96.888 kg (213 lb 9.6 oz) 98.3 kg (216 lb 11.4 oz) 99.292 kg (218 lb 14.4 oz)    Scheduled Meds: . acetaminophen  1,000 mg Oral 4 times per day  . aspirin EC  325 mg Oral Daily  . budesonide-formoterol  2 puff Inhalation BID  . calcium acetate  2,001 mg Oral TID WC  . citalopram  20 mg Oral Daily  . clopidogrel  75 mg Oral Q breakfast  . [START ON 03/05/2015] cyanocobalamin  1,000 mcg Intramuscular Q30 days  . darbepoetin (ARANESP) injection - DIALYSIS  60 mcg Intravenous Q Thu-HD  . donepezil  5 mg Oral QHS  . doxercalciferol  1.5 mcg Intravenous Q T,Th,Sa-HD  . insulin aspart  0-24 Units Subcutaneous TID AC & HS  . metoprolol tartrate  12.5 mg Oral BID  . pantoprazole  40 mg Oral Daily  . sodium chloride  3 mL Intravenous Q12H  . sodium chloride  3 mL Intravenous Q12H  . traZODone  25 mg Oral QHS   Continuous Infusions:   PRN Meds:.sodium chloride, sodium chloride, sodium chloride, sodium chloride, acetaminophen, albuterol, ALPRAZolam, alteplase, antiseptic oral rinse, baclofen, heparin, lidocaine (PF), lidocaine-prilocaine, metoprolol, ondansetron (ZOFRAN) IV, oxyCODONE, pentafluoroprop-tetrafluoroeth, sodium chloride, sodium chloride, zolpidem  Current Labs: reviewed    Physical Exam:  Blood pressure 134/50, pulse 80, temperature 98.5 F (36.9 C), temperature source Oral, resp. rate 18, height 5\' 4"  (1.626 m), weight 99.292 kg (218 lb 14.4 oz), SpO2 97 %. GEN: NAD, resting ENT: NCAT EYES: EOMI CV: RRR, nl s1s2 PULM: ctab ABD: s/nt/nd SKIN: no rashes/lesions EXT:No LEE AVF: LFA AVF+B/T  Outpt HD Orders Unit: [facility] [address] Days: THS Time: 3h34min Dialyzer: Elisio 15 EDW: 96kg K/Ca:  2/2.5 Access: AVF Needle Size: 15g  BFR/DFR: 450/600  VDRA: Hectorol 1.5qTx EPO: 3800 IU qTx IV Fe: Venofer 50qWk Heparin: none Most Recent Phos / PTH:   A/P 1. ESRD: [facility] 1. Last HD 10/31 2. Back on schedule with dialysis today 2. 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 HTN/Vol: as above, keep outpt EDW right now, BP stable 4. Anemia: Hb stable, monitor 1. Next HD will give aranesp 60 5. MBD:  1. Cont Binder 2. Hectorolo with HD, stop PO VDRA  Sabra Heck MD 02/21/2015, 11:14 AM   Recent Labs Lab 02/15/15 1056  02/19/15 1624 02/20/15 0543 02/21/15 0331  NA 136  < > 142 142 139  K 3.9  < > 3.9 4.2 4.2  CL 95*  < > 97* 99* 98*  CO2 27  --   --  30 30  GLUCOSE 140*  < > 88 80 102*  BUN 26*  < > 19 22* 35*  CREATININE 6.04*  < > 5.10* 6.33* 7.82*  CALCIUM 9.7  --   --  9.4 9.9  < > = values in this interval not displayed.  Recent Labs Lab 02/20/15 0543 02/20/15 1636 02/21/15 0331  WBC 5.0 6.1 5.1  HGB 8.4* 8.7* 8.3*  HCT 26.7* 27.8* 26.6*  MCV 106.0* 105.7* 105.6*  PLT 157 140* 131*

## 2015-02-21 NOTE — Discharge Summary (Signed)
Physician Discharge Summary       [address] [place]       [name] [id number]             [phone]    Patient ID: Ariel Smith MRN: [id number] DOB/AGE: 1945/11/11 69 y.o.  Admit date: 02/19/2015 Discharge date: 02/22/2015   Principle Diagnosis: Severe aortic stenosis  Discharge Diagnoses:  1. History of hypertension 2. History of hyperlipidemia 3. History of ESRD (HD days are T,Th, Sat) 4. History of COPD 5. History of anemia of chronic disease 6. History of chronic diastolic CHF 7. History of GERD 8. History of secondary hyperparathyroidism of renal origin 29. History of DVT of lower extremity(left sided x2, with PE s/p IVC filter placement  10. History of GIB (gastrointestinal bleeding) 11. History of PE 12. History of depression   Consults: nephrology   Procedure (s):   Transcatheter Aortic Valve Replacement - Percutaneous Right Transfemoral Approach Edwards Sapien 3 THV (size 23 mm, model # 9600TFX, serial # [id number]) by Dr. Cornelius Moras and Excell Seltzer on 02/19/2015.   History of Presenting Illness: This is an obese 69 year old female with history of aortic stenosis, end-stage renal disease on hemodialysis, hypertension, chronic diastolic congestive heart failure, COPD on home oxygen therapy, remote history of DVT and pulmonary embolus status post IVC filter placement, chronic anemia with recent history of GI bleeding, and severe physical deconditioning with limited mobility who has been referred for surgical consultation to discuss treatment options for management of severe symptomatic aortic stenosis. The patient has a long-standing history of shortness of breath and chronic respiratory failure that is likely multifactorial and has been attributed to chronic diastolic congestive heart failure and COPD. She has chronic kidney disease attributed to hypertension that became end stage in 2013. She has been on hemodialysis ever since and currently dialyzes  every Tuesday, Thursday, Saturday via left forearm AV fistula in the [facility] dialysis clinic. The patient and her husband state that she has been progressively getting worse for several years.   The patient has been hospitalized several times at [facility] over the past 3 months. She initially presented in July with an acute upper GI bleed with hemoglobin 6.4. At the time she was chronically anticoagulated using warfarin and therapeutic with INR 2.7. Upper GI endoscopy revealed gastritis. She developed acute respiratory failure that was felt to be secondary to hospital-acquired pneumonia and acute exacerbation of chronic diastolic congestive heart failure. Warfarin therapy was discontinued and the patient underwent IVC filter placement. She was in the intensive care unit for approximately 2 weeks and eventually discharged from the hospital 11/22/2014. During that hospitalization, she underwent transthoracic echocardiogram demonstrating the presence of severe aortic stenosis with peak velocity across the aortic valve measured 3.8 m/s corresponding to mean transvalvular gradient estimated 34 mmHg. Left ventricular systolic function appeared normal with ejection fraction estimated 60-65%. She has been hospitalized again three times since July with recurrent episodes of acute on chronic respiratory failure with hypoxia attributed to healthcare associated pneumonia and acute exacerbation of chronic diastolic and just of heart failure with fluid overload, most recently from 12/28/2014 to 12/31/2014. Each time, the symptoms reportedly improved with dialysis therapy to treat volume overload. According to the patient and her husband, her dialysis treatments have been extended to facilitate better fluid removal, and she has been doing a little better over the last few weeks. However, she remains extremely limited by severe exertional shortness of breath and fatigue and she still has intermittent  episodes of resting shortness of breath. She was referred to Dr. Excell Seltzer and underwent diagnostic cardiac catheterization on 01/18/2015. Findings were consistent with severe aortic stenosis with mean gradient across the aortic valve measured 40.7 mmHg. Pulmonary artery pressures were only mildly elevated and the patient did not have significant coronary artery disease. The patient was seen by Dr. Cornelius Moras in consultation.  The patient's functional status has been quite limited for several years. She has been obese the majority of her adult life and has lived a sedentary lifestyle. She has retired since 2009, having previous and worked in a [employer]. She is limited primarily by severe exertional shortness of breath and generalized weakness with physical deconditioning. She walks using a cane or a walker and can only walk very short distances. The patient has severe chronic exertional shortness of breath that she states first developed approximately 3 years ago and has become dramatically worse recently. She has been on home oxygen therapy for more than 2 years and cannot be off of oxygen for more than a few minutes without developing shortness of breath. She has intermittent resting shortness of breath and she gets short of breath with minimal activity. She has had dizzy spells without syncope. She has some tightness across her chest that seems unrelated to activity. She has some lower extremity edema.  The patient has stage D severe symptomatic aortic stenosis with New York Heart Association functional class IIIB-IV symptoms of fatigue and shortness of breath with minimal exertion and occasionally at rest, consistent with chronic diastolic congestive heart failure. She has been hospitalized multiple times over the last few months with acute exacerbations of respiratory failure that is likely multifactorial and related to severe AS and chronic diastolic congestive heart failure, morbid obesity, and  oxygen-dependent COPD.Dr. Cornelius Moras personally reviewed the patient's recent transthoracic echocardiogram, cardiac catheterization, and CT studies. Echocardiogram confirmed the presence of severe aortic stenosis with moderate to severe thickening, calcification, and restricted leaflet mobility involving all 3 leaflets of the patient's aortic valve. The mean gradient is 34 mm by echo and 40 mm by cath. Given the patient's severe physical deconditioning and numerous comorbid medical problems, Dr. Cornelius Moras agreed that she is not a candidate for conventional surgical aortic valve replacement under any circumstances. Options include transcatheter aortic valve replacement or long-term palliative medical therapy.It is possible that elimination of the patient's aortic stenosis could lead to significant improvement in symptoms of congestive heart failure and management of her volume status using hemodialysis.Her CT scans show that she would be a candidate for a 23 mm Sapien 3 valve and her pelvic arterial access appears adequate for a transfemoral approach on either side. I reviewed the procedure of TAVR with her and her husband and answered all of their questions. They would like to proceed with TAVR. She was admitted on 02/19/2015 in order to undergo a TAVR via right transfemoral approach.   Brief Hospital Course:  The patient was extubated the evening of surgery without difficulty. She remained afebrile and hemodynamically stable. Theone Murdoch, a line, and foley were removed early in the postoperative course. Chest tubes remained for a couple of days and then were removed. Lopressor was started and titrated accordingly. Dr. Marisue Humble followed patient postop and arranged for hemodialysis. She had a history of anemia of chronic disease and was anemic postop.  She was on Aranesp. Her last H and H was 8.3 and 26.6. She was weaned off the insulin drip. The patient's glucose remained well controlled. The patient's HGA1C  pre op was   5.2.  Echo done on 11/2 showed LVEF 60-65%, mild to moderate aortic stenosis, no AI, mild MR.The patient was felt surgically stable for transfer from the ICU to [facility] for further convalescence on 02/20/2015. She continues to progress with PT. She is ambulating on 3 liters of oxygen and was on oxygen prior to surgery. She has been tolerating a diet and has had a bowel movement. She has restarted dialysis on her home Tuesday-Thursday-Saturday schedule. The patient is felt surgically stable for discharge today.    Latest Vital Signs: Blood pressure 108/53, pulse 78, temperature 98.5 F (36.9 C), temperature source Oral, resp. rate 16, height 5\' 4"  (1.626 m), weight 212 lb 14.4 oz (96.571 kg), SpO2 97 %.  Physical Exam: General appearance: alert, cooperative and no distress Heart: regular rate and rhythm Lungs: diminshed in bases Abdomen: benign Extremities: minor edema Wound: groins without hematoma or erythema    Discharge Condition:Surgically stable for discharge home  Recent laboratory studies:  Lab Results  Component Value Date   WBC 5.1 02/21/2015   HGB 8.3* 02/21/2015   HCT 26.6* 02/21/2015   MCV 105.6* 02/21/2015   PLT 131* 02/21/2015   Lab Results  Component Value Date   NA 139 02/21/2015   K 4.2 02/21/2015   CL 98* 02/21/2015   CO2 30 02/21/2015   CREATININE 7.82* 02/21/2015   GLUCOSE 102* 02/21/2015      Diagnostic Studies: Dg Chest 2 View  02/21/2015  CLINICAL DATA:  Status post aortic valve repair EXAM: CHEST  2 VIEW COMPARISON:  02/20/2015 FINDINGS: The heart size is mildly enlarged. No pleural effusion or overt edema. Pulmonary vascular congestion noted. No airspace consolidation. IMPRESSION: 1. Cardiac enlargement and pulmonary vascular congestion. No frank edema or effusion. Electronically Signed   By: Signa Kell M.D.   On: 02/21/2015 07:46   Ct Coronary Morp W/cta Cor W/score W/ca W/cm &/or Wo/cm  01/27/2015  ADDENDUM REPORT: 01/27/2015 15:58 CLINICAL  DATA:  69 year old female with severe aortic stenosis EXAM: Cardiac TAVR CT TECHNIQUE: The patient was scanned on a Philips 256 scanner. A 120 kV retrospective scan was triggered in the descending thoracic aorta at 111 HU's. Gantry rotation speed was 270 msecs and collimation was .9 mm. 5 mg of iv Metoprolol and no nitro were given. The 3D data set was reconstructed in 5% intervals of the R-R cycle. Systolic and diastolic phases were analyzed on a dedicated work station using MPR, MIP and VRT modes. The patient received 80 cc of contrast. FINDINGS: Aortic Valve: Severely thickened, moderately calcified trileaflet aortic valve with severely restricted leaflet opening. There is minimal subvalvular calcium in the LVOT. Aorta:  Normal caliber, minimal calcifications, no dissection. Sinotubular Junction:  24 x 23 mm Ascending Thoracic Aorta:  28 x 27 mm Aortic Arch:  23 x 22 mm Descending Thoracic Aorta:  Not visualized Sinus of Valsalva Measurements: Non-coronary:  26 mm Right -coronary:  24 mm Left -coronary:  26 mm Coronary Artery Height above Annulus: Left Main:  10 mm Right Coronary:  16 mm Virtual Basal Annulus Measurements: Maximum/Minimum Diameter:  24.4 x 20.4 mm Perimeter:  87 mm Area:  390 mm2 Optimum Fluoroscopic Angle for Delivery:  LAO 40 CAU 12 IMPRESSION: 1. Severely thickened, moderately calcified trileaflet aortic valve with severely restricted leaflet opening and annular measurement suitable for delivery of 23 mm Edward-SAPIEN 3 valve. 2. Sufficient annular to coronary distance. 3. Optimum Fluoroscopic Angle for Delivery  LAO 40 CAU 12 Tobias Alexander  Electronically Signed   By: Tobias Alexander   On: 01/27/2015 15:58  01/27/2015  EXAM: OVER-READ INTERPRETATION  CT CHEST The following report is an over-read performed by radiologist Dr. Lesia Hausen [facility] on 01/25/2015. This over-read does not include interpretation of cardiac or coronary anatomy or pathology. The coronary calcium  score/coronary CTA interpretation by the cardiologist is attached. History:  Severe aortic stenosis. COMPARISON:  11/12/2014 unenhanced chest CT. FINDINGS: Visualized great vessels are normal in course and caliber. No central pulmonary emboli. Coarsely calcified lower right paratracheal lymph nodes, likely from prior granulomatous disease. No pathologically enlarged axillary, mediastinal or hilar lymph nodes in the visualized chest. Visualized thoracic esophagus is normal. No pneumothorax or pleural effusion in the visualized chest. Anterior right upper lobe 4 mm solid pulmonary nodule (series 412/image 25), stable since 08/15/2013. Anterior right lower lobe 3 mm solid pulmonary nodule (412/30), not seen on 08/15/2013 chest CT. Mild hypoventilatory changes are seen in the dependent lungs. There is mosaic attenuation throughout the lungs. There are 2 hypodense subcentimeter lesions in the visualized liver, too small to characterize. No aggressive appearing focal osseous lesion. IMPRESSION: 1. Two right pulmonary nodules, including a 4 mm right upper lobe pulmonary nodule stable since 08/15/2013 and a 3 mm right lower lobe pulmonary nodule not previously seen. If the patient is at high risk for bronchogenic carcinoma, follow-up chest CT at 1 year is recommended. If the patient is at low risk, no follow-up is needed. This recommendation follows the consensus statement: Guidelines for Management of Small Pulmonary Nodules Detected on CT Scans: A Statement from the Fleischner Society as published in Radiology 2005; 237:395-400. 2. Mosaic attenuation throughout the lungs, a nonspecific finding that could be secondary to air trapping or pulmonary vascular disease. Electronically Signed: By: Delbert Phenix M.D. On: 01/25/2015 11:49   Ct Angio Chest Aorta W/cm &/or Wo/cm  01/28/2015  CLINICAL DATA:  69 year old female with history of severe aortic stenosis. Preprocedural study prior to potential transcatheter aortic valve  replacement (TAVR) procedure. EXAM: CT ANGIOGRAPHY CHEST, ABDOMEN AND PELVIS TECHNIQUE: Multidetector CT imaging through the chest, abdomen and pelvis was performed using the standard protocol during bolus administration of intravenous contrast. Multiplanar reconstructed images and MIPs were obtained and reviewed to evaluate the vascular anatomy. CONTRAST:  70mL OMNIPAQUE IOHEXOL 350 MG/ML SOLN COMPARISON:  Chest CT 11/12/2014. FINDINGS: CTA CHEST FINDINGS Mediastinum/Lymph Nodes: Heart size is mildly enlarged. Small amount of anterior pericardial fluid and/or thickening, unlikely to be of any hemodynamic significance at this time. No associated pericardial calcification. There is atherosclerosis of the thoracic aorta, the great vessels of the mediastinum and the coronary arteries, including calcified atherosclerotic plaque in the distal left main, left anterior descending and right coronary arteries. Severe calcifications of the aortic valve. Calcifications of the mitral annulus and mitral valve. Mild dilatation of the pulmonic trunk (3.2 cm in diameter). No pathologically enlarged mediastinal or hilar lymph nodes. Calcified right peritracheal lymph node incidentally noted. Esophagus is unremarkable in appearance. No axillary lymphadenopathy. Lungs/Pleura: A few scattered 2-3 mm nodules are noted in the right lung, highly nonspecific. No larger more suspicious appearing pulmonary nodules or masses are otherwise noted. There is a background of mild diffuse ground-glass attenuation and interlobular septal thickening, suggesting mild interstitial pulmonary edema. Some focal thickening of the peribronchovascular interstitium is noted in the lower lobes of the lungs bilaterally, where there is some mild architectural distortion, favored to reflect chronic post infectious or inflammatory scarring. No acute consolidative airspace disease.  No pleural effusions. Musculoskeletal/Soft Tissues: There are no aggressive  appearing lytic or blastic lesions noted in the visualized portions of the skeleton. CTA ABDOMEN AND PELVIS FINDINGS Hepatobiliary: A few sub cm low-attenuation lesions are noted in the liver, too small to characterize, but favored to represent tiny cysts. The liver has a slightly shrunken appearance and slightly nodular contour, suggestive of underlying cirrhosis. No hypervascular hepatic lesion noted. Status post cholecystectomy. No intrahepatic biliary ductal dilatation. Common bile duct measures 8 mm in the porta hepatis (within normal limits for post cholecystectomy patient). Pancreas: No pancreatic mass. Main pancreatic duct is mildly dilated measuring up to 5 mm in the proximal body and 6 mm in the head of the pancreas. No pancreatic or peripancreatic fluid or inflammatory changes. Spleen: Spleen is borderline enlarged measuring 12.7 x 5.5 x 9.6 cm (estimated splenic volume of 335 mL). Adrenals/Urinary Tract: Kidneys are severely atrophic bilaterally. Multiple sub cm low-attenuation lesions in the kidneys are too small to definitively characterize, but are favored to represent tiny cysts. No hydroureteronephrosis. Urinary bladder is largely decompressed, but otherwise unremarkable in appearance. Bilateral adrenal glands are normal in appearance. Stomach/Bowel: The appearance of the stomach is normal. No pathologic dilatation of small bowel or colon. A few scattered colonic diverticulae are noted, without surrounding inflammatory changes to suggest an acute diverticulitis at this time. Normal appendix. Vascular/Lymphatic: Vascular findings and measurements pertinent to potential TAVR procedure, as detailed below. No evidence of aneurysm or dissection. Soft tissue stranding adjacent to the right common femoral artery, presumably related to recent vascular access procedure. IVC filter in position with tip terminating shortly beneath the level of the renal veins. No filter associated thrombus noted. No  lymphadenopathy noted in the abdomen or pelvis. Reproductive: Uterus and ovaries are atrophic. A few coarse calcifications in the fundus of the uterus likely represent old calcified fibroids. Other: No significant volume of ascites.  No pneumoperitoneum. Musculoskeletal: There are no aggressive appearing lytic or blastic lesions noted in the visualized portions of the skeleton. VASCULAR MEASUREMENTS PERTINENT TO TAVR: AORTA: Minimal Aortic Diameter -  15 x 13 mm Severity of Aortic Calcification -  moderate RIGHT PELVIS: Right Common Iliac Artery - Minimal Diameter - 9.4 x 8.1 mm Tortuosity - Mild Calcification - Mild Right External Iliac Artery - Minimal Diameter - 6.5 x 5.6 mm Tortuosity - Mild Calcification - None Right Common Femoral Artery - Minimal Diameter - 8.0 x 7.2 mm Tortuosity - Mild Calcification - None LEFT PELVIS: Left Common Iliac Artery - Minimal Diameter - 7.4 x 9.0 mm Tortuosity - Moderate Calcification - Mild Left External Iliac Artery - Minimal Diameter - 6.9 x 6.1 mm Tortuosity - Moderate Calcification - None Left Common Femoral Artery - Minimal Diameter - 7.4 x 7.6 mm Tortuosity - Mild Calcification - None Review of the MIP images confirms the above findings. IMPRESSION: 1. Vascular findings and measurements pertinent to potential TAVR procedure, as detailed above. This patient does appear to have suitable pelvic arterial access bilaterally. 2. Mild cardiomegaly with evidence of mild interstitial pulmonary edema; findings suggestive of mild congestive heart failure. 3. Mild colonic diverticulosis without evidence of acute diverticulitis at this time. 4. Mild dilatation of the pancreatic duct in the head and proximal body of the pancreas. This is nonspecific, but the possibility of indolent neoplasm such as an intraductal papillary mucinous neoplasm is not excluded. Follow-up evaluation with MRI of the abdomen with and without IV gadolinium with MRCP is recommended in 1 year to evaluate the  stability of this finding. This recommendation follows ACR consensus guidelines: Managing Incidental Findings on Abdominal CT: White Paper of the ACR Incidental Findings Committee. J Am Coll Radiol 2010;7:754-773. 5. Additional incidental findings, as above. Electronically Signed   By: Trudie Reed M.D.   On: 01/28/2015 09:37   Ct Angio Abd/pel W/ And/or W/o  01/28/2015  CLINICAL DATA:  69 year old female with history of severe aortic stenosis. Preprocedural study prior to potential transcatheter aortic valve replacement (TAVR) procedure. EXAM: CT ANGIOGRAPHY CHEST, ABDOMEN AND PELVIS TECHNIQUE: Multidetector CT imaging through the chest, abdomen and pelvis was performed using the standard protocol during bolus administration of intravenous contrast. Multiplanar reconstructed images and MIPs were obtained and reviewed to evaluate the vascular anatomy. CONTRAST:  70mL OMNIPAQUE IOHEXOL 350 MG/ML SOLN COMPARISON:  Chest CT 11/12/2014. FINDINGS: CTA CHEST FINDINGS Mediastinum/Lymph Nodes: Heart size is mildly enlarged. Small amount of anterior pericardial fluid and/or thickening, unlikely to be of any hemodynamic significance at this time. No associated pericardial calcification. There is atherosclerosis of the thoracic aorta, the great vessels of the mediastinum and the coronary arteries, including calcified atherosclerotic plaque in the distal left main, left anterior descending and right coronary arteries. Severe calcifications of the aortic valve. Calcifications of the mitral annulus and mitral valve. Mild dilatation of the pulmonic trunk (3.2 cm in diameter). No pathologically enlarged mediastinal or hilar lymph nodes. Calcified right peritracheal lymph node incidentally noted. Esophagus is unremarkable in appearance. No axillary lymphadenopathy. Lungs/Pleura: A few scattered 2-3 mm nodules are noted in the right lung, highly nonspecific. No larger more suspicious appearing pulmonary nodules or masses are  otherwise noted. There is a background of mild diffuse ground-glass attenuation and interlobular septal thickening, suggesting mild interstitial pulmonary edema. Some focal thickening of the peribronchovascular interstitium is noted in the lower lobes of the lungs bilaterally, where there is some mild architectural distortion, favored to reflect chronic post infectious or inflammatory scarring. No acute consolidative airspace disease. No pleural effusions. Musculoskeletal/Soft Tissues: There are no aggressive appearing lytic or blastic lesions noted in the visualized portions of the skeleton. CTA ABDOMEN AND PELVIS FINDINGS Hepatobiliary: A few sub cm low-attenuation lesions are noted in the liver, too small to characterize, but favored to represent tiny cysts. The liver has a slightly shrunken appearance and slightly nodular contour, suggestive of underlying cirrhosis. No hypervascular hepatic lesion noted. Status post cholecystectomy. No intrahepatic biliary ductal dilatation. Common bile duct measures 8 mm in the porta hepatis (within normal limits for post cholecystectomy patient). Pancreas: No pancreatic mass. Main pancreatic duct is mildly dilated measuring up to 5 mm in the proximal body and 6 mm in the head of the pancreas. No pancreatic or peripancreatic fluid or inflammatory changes. Spleen: Spleen is borderline enlarged measuring 12.7 x 5.5 x 9.6 cm (estimated splenic volume of 335 mL). Adrenals/Urinary Tract: Kidneys are severely atrophic bilaterally. Multiple sub cm low-attenuation lesions in the kidneys are too small to definitively characterize, but are favored to represent tiny cysts. No hydroureteronephrosis. Urinary bladder is largely decompressed, but otherwise unremarkable in appearance. Bilateral adrenal glands are normal in appearance. Stomach/Bowel: The appearance of the stomach is normal. No pathologic dilatation of small bowel or colon. A few scattered colonic diverticulae are noted, without  surrounding inflammatory changes to suggest an acute diverticulitis at this time. Normal appendix. Vascular/Lymphatic: Vascular findings and measurements pertinent to potential TAVR procedure, as detailed below. No evidence of aneurysm or dissection. Soft tissue stranding adjacent to the right common femoral artery, presumably related to  recent vascular access procedure. IVC filter in position with tip terminating shortly beneath the level of the renal veins. No filter associated thrombus noted. No lymphadenopathy noted in the abdomen or pelvis. Reproductive: Uterus and ovaries are atrophic. A few coarse calcifications in the fundus of the uterus likely represent old calcified fibroids. Other: No significant volume of ascites.  No pneumoperitoneum. Musculoskeletal: There are no aggressive appearing lytic or blastic lesions noted in the visualized portions of the skeleton. VASCULAR MEASUREMENTS PERTINENT TO TAVR: AORTA: Minimal Aortic Diameter -  15 x 13 mm Severity of Aortic Calcification -  moderate RIGHT PELVIS: Right Common Iliac Artery - Minimal Diameter - 9.4 x 8.1 mm Tortuosity - Mild Calcification - Mild Right External Iliac Artery - Minimal Diameter - 6.5 x 5.6 mm Tortuosity - Mild Calcification - None Right Common Femoral Artery - Minimal Diameter - 8.0 x 7.2 mm Tortuosity - Mild Calcification - None LEFT PELVIS: Left Common Iliac Artery - Minimal Diameter - 7.4 x 9.0 mm Tortuosity - Moderate Calcification - Mild Left External Iliac Artery - Minimal Diameter - 6.9 x 6.1 mm Tortuosity - Moderate Calcification - None Left Common Femoral Artery - Minimal Diameter - 7.4 x 7.6 mm Tortuosity - Mild Calcification - None Review of the MIP images confirms the above findings. IMPRESSION: 1. Vascular findings and measurements pertinent to potential TAVR procedure, as detailed above. This patient does appear to have suitable pelvic arterial access bilaterally. 2. Mild cardiomegaly with evidence of mild interstitial  pulmonary edema; findings suggestive of mild congestive heart failure. 3. Mild colonic diverticulosis without evidence of acute diverticulitis at this time. 4. Mild dilatation of the pancreatic duct in the head and proximal body of the pancreas. This is nonspecific, but the possibility of indolent neoplasm such as an intraductal papillary mucinous neoplasm is not excluded. Follow-up evaluation with MRI of the abdomen with and without IV gadolinium with MRCP is recommended in 1 year to evaluate the stability of this finding. This recommendation follows ACR consensus guidelines: Managing Incidental Findings on Abdominal CT: White Paper of the ACR Incidental Findings Committee. J Am Coll Radiol 2010;7:754-773. 5. Additional incidental findings, as above. Electronically Signed   By: Trudie Reed M.D.   On: 01/28/2015 09:37     Discharge Medications:   Medication List    STOP taking these medications        ciprofloxacin 500 MG tablet  Commonly known as:  CIPRO      TAKE these medications        acetaminophen 500 MG tablet  Commonly known as:  TYLENOL  Take 1,000 mg by mouth every 6 (six) hours as needed for mild pain or headache.     ALPRAZolam 1 MG tablet  Commonly known as:  XANAX  Take 1 tablet (1 mg total) by mouth 3 (three) times daily as needed for anxiety.     aspirin 325 MG EC tablet  Take 1 tablet (325 mg total) by mouth daily.     baclofen 10 MG tablet  Commonly known as:  LIORESAL  Take 0.5-1 tablets (5-10 mg total) by mouth 2 (two) times daily as needed for muscle spasms.     budesonide-formoterol 160-4.5 MCG/ACT inhaler  Commonly known as:  SYMBICORT  Inhale 2 puffs into the lungs 2 (two) times daily.     calcitRIOL 0.25 MCG capsule  Commonly known as:  ROCALTROL  Take 0.25 mcg by mouth daily.     calcium acetate 667 MG capsule  Commonly known  as:  PHOSLO  Take 667-2,001 mg by mouth 3 (three) times daily with meals. Take 3 with meals and 1 with a snack      citalopram 20 MG tablet  Commonly known as:  CELEXA  Take 1 tablet (20 mg total) by mouth daily.     clopidogrel 75 MG tablet  Commonly known as:  PLAVIX  Take 1 tablet (75 mg total) by mouth daily with breakfast.     cyanocobalamin 1000 MCG/ML injection  Commonly known as:  (VITAMIN B-12)  Inject 1,000 mcg into the muscle every 30 (thirty) days.     docusate sodium 100 MG capsule  Commonly known as:  COLACE  Take 1 capsule (100 mg total) by mouth 2 (two) times daily.     donepezil 5 MG tablet  Commonly known as:  ARICEPT  Take 1 tablet (5 mg total) by mouth at bedtime.     furosemide 20 MG tablet  Commonly known as:  LASIX  Take 1 tablet (20 mg total) by mouth daily.     hydrOXYzine 25 MG tablet  Commonly known as:  ATARAX/VISTARIL  Take 0.5 tablets (12.5 mg total) by mouth 2 (two) times daily.     lidocaine-prilocaine cream  Commonly known as:  EMLA  Apply 1 application topically as needed (topical anesthesia for hemodialysis if Gebauers and Lidocaine injection are ineffective.).     lovastatin 20 MG tablet  Commonly known as:  MEVACOR  TAKE ONE TABLET BY MOUTH AT BEDTIME     metoprolol tartrate 25 MG tablet  Commonly known as:  LOPRESSOR  Take 0.5 tablets (12.5 mg total) by mouth 2 (two) times daily.     multivitamin with minerals Tabs tablet  Take 1 tablet by mouth daily.     oxyCODONE 5 MG immediate release tablet  Commonly known as:  Oxy IR/ROXICODONE  Take 1-2 tablets (5-10 mg total) by mouth every 3 (three) hours as needed for severe pain.     pantoprazole 40 MG tablet  Commonly known as:  PROTONIX  Take 40 mg by mouth daily as needed (for heartburn/indigestion).     promethazine 12.5 MG tablet  Commonly known as:  PHENERGAN  Take 12.5 mg by mouth every 6 (six) hours as needed for nausea or vomiting.     traZODone 50 MG tablet  Commonly known as:  DESYREL  Take 0.5 tablets (25 mg total) by mouth at bedtime.     VENTOLIN HFA 108 (90 BASE) MCG/ACT  inhaler  Generic drug:  albuterol  Inhale 2 puffs into the lungs every 4 (four) hours as needed for wheezing or shortness of breath.     zolpidem 5 MG tablet  Commonly known as:  AMBIEN  Take 1 tablet (5 mg total) by mouth at bedtime as needed for sleep.       The patient has been discharged on:   1.Beta Blocker:  Yes [ x  ]                              No   [   ]                              If No, reason:  2.Ace Inhibitor/ARB: Yes [   ]  No  [  x  ]                                     If No, reason:ESRD  3.Statin:   Yes [ x  ]                  No  [   ]                  If No, reason:  4.Ecasa:  Yes  [  x ]                  No   [   ]                  If No, reason:   Follow Up Appointments: Follow-up Information    Follow up with Purcell Nails, MD On 03/11/2015.   Specialty:  Cardiothoracic Surgery   Why:  Appointment is with physician assistant. Appointment time is 1:30 pm   Contact information:   [address] [employer] [address] [place] [address] [phone]       Follow up with Tonny Bollman, MD On 04/01/2015.   Specialty:  Cardiology   Why:  Echo to be done at 3:00 pm;Appointment with Dr. Excell Seltzer is at 4:00 pm   Contact information:   [address] [place] [address] [phone]       Follow up with [name], MD On 04/08/2015.   Specialty:  Family Medicine   Why:  Appointment time is at 11:15 am   Contact information:   [address] [place] [address] [phone]       Follow up with Hemodialysis.   Why:  HD days are Tuesday, Thursday, Saturday      Signed: Zadiel Leyh HPA-C 02/22/2015, 8:28 AM

## 2015-02-21 NOTE — Discharge Instructions (Signed)
Activity: 1.May walk up steps                2.No driving for two weeks.                3.Stop any activity that causes chest pain, shortness of breath, dizziness, sweating or excessive weakness.                4.Continue with your breathing exercises daily.  Diet: Diabetic diet and renal diet and low salt  Wound Care: May shower.  Clean wounds with mild soap and water daily. Contact the office at [phone] if any problems arise.

## 2015-02-21 NOTE — Care Management Note (Addendum)
Case Management Note  Patient Details  Name: Ariel Smith MRN: [id number] Date of Birth: 1945/11/26  Subjective/Objective:  Pt is s/p TAVR                  Action/Plan:  Pt is independent from home with husband.  Pt is already active with [facility] for PT and  RN, agency to verify current services.  CM contacted agency to inform of admit.  CM asked MD for resumption/new HH orders.  Per pt and husband; already have all the equipment that they needs.  CM will continue to monitor  for disposition needs   Expected Discharge Date:                  Expected Discharge Plan:  Home/Self Care  In-House Referral:     Discharge planning Services  CM Consult  Post Acute Care Choice:  Resumption of Svcs/PTA Provider Choice offered to:     DME Arranged:    DME Agency:     HH Arranged:  PT, RN HH Agency:  [facility]  Status of Service:  In process, will continue to follow  Medicare Important Message Given:    Date Medicare IM Given:    Medicare IM give by:    Date Additional Medicare IM Given:    Additional Medicare Important Message give by:     If discussed at Long Length of Stay Meetings, dates discussed:    Additional Comments:  Cherylann Parr, RN 02/21/2015, 10:59 AM

## 2015-02-22 ENCOUNTER — Telehealth (HOSPITAL_COMMUNITY): Payer: Self-pay | Admitting: Nurse Practitioner

## 2015-02-22 ENCOUNTER — Ambulatory Visit: Payer: Medicare Other | Admitting: Family

## 2015-02-22 LAB — TYPE AND SCREEN
ABO/RH(D): O POS
ANTIBODY SCREEN: NEGATIVE
UNIT DIVISION: 0
Unit division: 0

## 2015-02-22 LAB — GLUCOSE, CAPILLARY: Glucose-Capillary: 99 mg/dL (ref 65–99)

## 2015-02-22 MED ORDER — TRAZODONE HCL 50 MG PO TABS: 25.0000 mg | ORAL_TABLET | Freq: Every day | ORAL | Status: DC

## 2015-02-22 MED ORDER — OXYCODONE HCL 5 MG PO TABS: 5.0000 mg | ORAL_TABLET | ORAL | Status: DC | PRN

## 2015-02-22 MED ORDER — HYDROXYZINE HCL 25 MG PO TABS: 12.5000 mg | ORAL_TABLET | Freq: Two times a day (BID) | ORAL | Status: DC

## 2015-02-22 MED ORDER — METOPROLOL TARTRATE 25 MG PO TABS: 12.5000 mg | ORAL_TABLET | Freq: Two times a day (BID) | ORAL | Status: DC

## 2015-02-22 MED ORDER — ASPIRIN 325 MG PO TBEC: 325.0000 mg | DELAYED_RELEASE_TABLET | Freq: Every day | ORAL | Status: DC

## 2015-02-22 MED ORDER — CLOPIDOGREL BISULFATE 75 MG PO TABS: 75.0000 mg | ORAL_TABLET | Freq: Every day | ORAL | Status: DC

## 2015-02-22 NOTE — Care Management Important Message (Signed)
Important Message  Patient Details  Name: Ariel Smith MRN: [id number] Date of Birth: 1945-08-27   Medicare Important Message Given:  Yes-second notification given    Kyla Balzarine 02/22/2015, 10:48 AM

## 2015-02-22 NOTE — Progress Notes (Signed)
       [address]       [place] [address]             [phone]          3 Days Post-Op Procedure(s) (LRB): TRANSCATHETER AORTIC VALVE REPLACEMENT, TRANSFEMORAL (Right) TRANSESOPHAGEAL ECHOCARDIOGRAM (TEE) (N/A)  Subjective: Feels well, no complaints. Ready to go home.   Objective: Vital signs in last 24 hours: Patient Vitals for the past 24 hrs:  BP Temp Temp src Pulse Resp SpO2 Weight  02/22/15 0506 (!) 108/53 mmHg 98.5 F (36.9 C) Oral 78 16 97 % 212 lb 14.4 oz (96.571 kg)  02/21/15 2157 (!) 124/45 mmHg 98.9 F (37.2 C) Oral 84 18 99 % -  02/21/15 1530 (!) 109/59 mmHg 97.6 F (36.4 C) Oral 81 16 98 % 211 lb 10.3 oz (96 kg)  02/21/15 1500 (!) 108/59 mmHg - - 79 - - -  02/21/15 1430 124/64 mmHg - - 81 - - -  02/21/15 1400 124/67 mmHg - - 77 - - -  02/21/15 1330 110/66 mmHg - - 87 - - -  02/21/15 1300 139/67 mmHg - - 83 - - -  02/21/15 1230 140/70 mmHg - - 79 - - -  02/21/15 1200 (!) 147/73 mmHg - - 79 - - -  02/21/15 1152 (!) 164/78 mmHg 97.6 F (36.4 C) Oral 88 20 98 % 218 lb 14.7 oz (99.3 kg)  02/21/15 0939 - - - - - 97 % -   Current Weight  02/22/15 212 lb 14.4 oz (96.571 kg)     Intake/Output from previous day: 11/03 0701 - 11/04 0700 In: 720 [P.O.:720] Out: 3000     PHYSICAL EXAM:  Heart: RRR Lungs: Clear Wound: Groin stable with no hematoma Extremities: No edema    Lab Results: CBC: Recent Labs  02/20/15 1636 02/21/15 0331  WBC 6.1 5.1  HGB 8.7* 8.3*  HCT 27.8* 26.6*  PLT 140* 131*   BMET:  Recent Labs  02/20/15 0543 02/21/15 0331  NA 142 139  K 4.2 4.2  CL 99* 98*  CO2 30 30  GLUCOSE 80 102*  BUN 22* 35*  CREATININE 6.33* 7.82*  CALCIUM 9.4 9.9    PT/INR:  Recent Labs  02/19/15 1051  LABPROT 15.9*  INR 1.26      Assessment/Plan: S/P Procedure(s) (LRB): TRANSCATHETER AORTIC VALVE REPLACEMENT, TRANSFEMORAL (Right) TRANSESOPHAGEAL ECHOCARDIOGRAM (TEE) (N/A)  Pt stable and doing well. Plan  d/c home today. Will resume her regular T-Th-Sa HD schedule as outpatient.   LOS: 3 days    COLLINS,GINA H 02/22/2015

## 2015-02-22 NOTE — Progress Notes (Signed)
Pt discharge education completed with husband at bedside.  Extra notation added to Pt's discharge medication list d/t daughter sets medications up for Pt.  Telemetry box removed and CCMD notified.  IV removed with intact catheter.  Pt denies chest pain or SOB.  Pt discharged with husband via WC with volunteers with Pt on own 3L of 02.  Pt states she has all belongings including hearing aids.  No s/s of distress.  Pt stable at discharge.

## 2015-02-23 DIAGNOSIS — Z48812 Encounter for surgical aftercare following surgery on the circulatory system: Secondary | ICD-10-CM | POA: Diagnosis not present

## 2015-02-25 MED FILL — Phenylephrine HCl Inj 10 MG/ML: INTRAMUSCULAR | Qty: 2 | Status: AC

## 2015-02-25 MED FILL — Dextrose Inj 5%: INTRAVENOUS | Qty: 250 | Status: AC

## 2015-02-25 MED FILL — Insulin Regular (Human) Inj 100 Unit/ML: INTRAMUSCULAR | Qty: 2.5 | Status: AC

## 2015-03-10 ENCOUNTER — Other Ambulatory Visit: Payer: Self-pay | Admitting: Family Medicine

## 2015-03-11 ENCOUNTER — Ambulatory Visit (INDEPENDENT_AMBULATORY_CARE_PROVIDER_SITE_OTHER): Payer: Medicare Other | Admitting: Surgical

## 2015-03-11 VITALS — BP 130/70 | HR 74 | Resp 16 | Ht 64.0 in | Wt 212.0 lb

## 2015-03-11 DIAGNOSIS — I5032 Chronic diastolic (congestive) heart failure: Secondary | ICD-10-CM

## 2015-03-11 DIAGNOSIS — Z952 Presence of prosthetic heart valve: Secondary | ICD-10-CM

## 2015-03-11 DIAGNOSIS — Z954 Presence of other heart-valve replacement: Secondary | ICD-10-CM

## 2015-03-11 DIAGNOSIS — I350 Nonrheumatic aortic (valve) stenosis: Secondary | ICD-10-CM

## 2015-03-11 NOTE — Telephone Encounter (Signed)
Ok to refill 

## 2015-03-11 NOTE — Progress Notes (Signed)
[address]       [place] [address]             [phone]                  Ariel Smith [facility] Medical Record #[id number] Date of Birth: [birth year]  Referring NH:[id number], Ariel Needle, MD Primary Cardiology: Primary Care:[name], MD  Chief Complaint:  Follow Up Visit  Date of Procedure:02/19/2015  Preoperative Diagnosis:Severe Aortic Stenosis   Postoperative Diagnosis:Same   Procedure:   Transcatheter Aortic Valve Replacement - Percutaneous Right Transfemoral Approach Edwards Sapien 3 THV (size 23 mm, model # 9600TFX, serial # [id number])  Co-Surgeons:Clarence H. Cornelius Moras, MD and Tonny Bollman, MD  Assistants:Bryan Jennefer Bravo, MD   Anesthesiologist:James Claybon Jabs, MD  Echocardiographer:Dalton Shirlee Latch, MD  Pre-operative Echo Findings:  Severe aortic stenosis  Normal left ventricular systolic function  Mild mitral regurgitation  Post-operative Echo Findings:  No paravalvular leak  Normal left ventricular systolic function  History of Present   The patient is a 69 year old white female seen in routine office follow-up following the above described procedure. She reports that she feels well. She has no specific complaints. She is on chronic oxygen for COPD but feels as though her breathing is improved. She denies fevers, chills or other constitutional symptoms. She has no significant pain. She has had no issues related to the groin percutaneous intervention.        Zubrod Score: At the time of surgery this patient's most appropriate activity status/level should be described as: []     0    Normal activity, no symptoms []     1    Restricted in physical strenuous activity but ambulatory, able to do out light work [x]     2    Ambulatory and capable of self care, unable to do work activities,  up and about                 >50 % of waking hours                                                                                   []     3    Only limited self care, in bed greater than 50% of waking hours []     4    Completely disabled, no self care, confined to bed or chair []     5    Moribund  History  Smoking status  . Former Smoker -- 2.00 packs/day for 30 years  . Types: Cigarettes  . Start date: 04/20/1978  . Quit date: 04/21/2007  Smokeless tobacco  . Never Used       Allergies  Allergen Reactions  . Aspirin Other (See Comments)    Pt is unable to take because she has kidney disease.     Gaspar Skeeters [Isometheptene-Dichloral-Apap] Other (See Comments)    Reaction:  Headaches     Current Outpatient Prescriptions  Medication Sig Dispense Refill  . acetaminophen (TYLENOL) 500 MG tablet Take 1,000 mg by mouth every 6 (six) hours as needed for mild pain or headache.     . albuterol (VENTOLIN HFA)  108 (90 BASE) MCG/ACT inhaler Inhale 2 puffs into the lungs every 4 (four) hours as needed for wheezing or shortness of breath.     . ALPRAZolam (XANAX) 1 MG tablet Take 1 tablet (1 mg total) by mouth 3 (three) times daily as needed for anxiety. 30 tablet 0  . aspirin EC 325 MG EC tablet Take 1 tablet (325 mg total) by mouth daily. 30 tablet 0  . baclofen (LIORESAL) 10 MG tablet Take 0.5-1 tablets (5-10 mg total) by mouth 2 (two) times daily as needed for muscle spasms. 30 each 0  . budesonide-formoterol (SYMBICORT) 160-4.5 MCG/ACT inhaler Inhale 2 puffs into the lungs 2 (two) times daily.    . calcitRIOL (ROCALTROL) 0.25 MCG capsule Take 0.25 mcg by mouth daily.    . citalopram (CELEXA) 20 MG tablet Take 1 tablet (20 mg total) by mouth daily. 90 tablet 3  . clopidogrel (PLAVIX) 75 MG tablet Take 1 tablet (75 mg total) by mouth daily with breakfast. 30 tablet 1  . cyanocobalamin (,VITAMIN B-12,) 1000 MCG/ML injection Inject 1,000 mcg into the muscle every 30 (thirty) days.    Marland Kitchen  docusate sodium (COLACE) 100 MG capsule Take 1 capsule (100 mg total) by mouth 2 (two) times daily. (Patient taking differently: Take 100 mg by mouth 2 (two) times daily as needed for mild constipation. ) 10 capsule 0  . donepezil (ARICEPT) 5 MG tablet Take 1 tablet (5 mg total) by mouth at bedtime. 30 tablet 3  . furosemide (LASIX) 20 MG tablet Take 1 tablet (20 mg total) by mouth daily. 30 tablet 3  . hydrOXYzine (ATARAX/VISTARIL) 25 MG tablet Take 0.5 tablets (12.5 mg total) by mouth 2 (two) times daily. 30 tablet 0  . lidocaine-prilocaine (EMLA) cream Apply 1 application topically as needed (topical anesthesia for hemodialysis if Gebauers and Lidocaine injection are ineffective.). 30 g 0  . lovastatin (MEVACOR) 20 MG tablet TAKE ONE TABLET BY MOUTH AT BEDTIME 90 tablet 3  . metoprolol tartrate (LOPRESSOR) 25 MG tablet Take 0.5 tablets (12.5 mg total) by mouth 2 (two) times daily. 60 tablet 1  . Multiple Vitamin (MULTIVITAMIN WITH MINERALS) TABS tablet Take 1 tablet by mouth daily. 1 tablet 0  . pantoprazole (PROTONIX) 40 MG tablet Take 40 mg by mouth daily as needed (for heartburn/indigestion).    . promethazine (PHENERGAN) 12.5 MG tablet Take 12.5 mg by mouth every 6 (six) hours as needed for nausea or vomiting.    . traZODone (DESYREL) 50 MG tablet Take 0.5 tablets (25 mg total) by mouth at bedtime. 30 tablet 3  . zolpidem (AMBIEN) 5 MG tablet Take 1 tablet (5 mg total) by mouth at bedtime as needed for sleep. 30 tablet 0  . calcium acetate (PHOSLO) 667 MG capsule Take 667-2,001 mg by mouth 3 (three) times daily with meals. Take 3 with meals and 1 with a snack    . oxyCODONE (OXY IR/ROXICODONE) 5 MG immediate release tablet Take 1-2 tablets (5-10 mg total) by mouth every 3 (three) hours as needed for severe pain. (Patient not taking: Reported on 03/11/2015) 30 tablet 0   No current facility-administered medications for this visit.       Physical Exam: BP 130/70 mmHg  Pulse 74  Resp 16   Ht  (1.626 m)  Wt 212 lb (96.163 kg)  BMI 36.37 kg/m2  SpO2 94%  General appearance: alert, cooperative and no distress Heart: 2/6 systolic murmur Lungs: clear to auscultation bilaterally Abdomen: benign Extremities: Trace lower  extremity edema Wounds: The right groin site is unremarkable and without evidence of hematoma.  femoral pulse is palpable  Diagnostic Studies & Laboratory data:         Recent Radiology Findings: No results found.    I have independently reviewed the above radiology findings and reviewed findings  with the patient.  Recent Labs: Lab Results  Component Value Date   WBC 5.1 02/21/2015   HGB 8.3* 02/21/2015   HCT 26.6* 02/21/2015   PLT 131* 02/21/2015   GLUCOSE 102* 02/21/2015   CHOL 155 03/30/2014   TRIG 275* 03/30/2014   HDL 33* 03/30/2014   LDLCALC 67 03/30/2014   ALT 15 02/15/2015   AST 23 02/15/2015   NA 139 02/21/2015   K 4.2 02/21/2015   CL 98* 02/21/2015   CREATININE 7.82* 02/21/2015   BUN 35* 02/21/2015   CO2 30 02/21/2015   TSH 1.27 11/01/2014   INR 1.26 02/19/2015   HGBA1C 5.2 02/15/2015      Assessment / Plan:   The patient is doing quite well. There are no specific issues related to the surgery that she is having any difficulties with. She will be seen at the routine 30 day TAVR clinic appointment as scheduled.       GOLD,WAYNE E 03/11/2015 1:42 PM

## 2015-03-11 NOTE — Telephone Encounter (Signed)
plz phone in. 

## 2015-03-12 NOTE — Telephone Encounter (Signed)
Rx called in as directed.   

## 2015-04-01 ENCOUNTER — Ambulatory Visit: Payer: Medicare Other | Admitting: Cardiovascular Disease

## 2015-04-01 ENCOUNTER — Ambulatory Visit (HOSPITAL_COMMUNITY): Payer: Medicare Other | Attending: Cardiology

## 2015-04-01 ENCOUNTER — Ambulatory Visit (INDEPENDENT_AMBULATORY_CARE_PROVIDER_SITE_OTHER): Payer: Medicare Other | Admitting: Cardiovascular Disease

## 2015-04-01 ENCOUNTER — Encounter: Payer: Self-pay | Admitting: Cardiovascular Disease

## 2015-04-01 ENCOUNTER — Other Ambulatory Visit: Payer: Self-pay

## 2015-04-01 VITALS — BP 140/90 | HR 68 | Ht 63.0 in | Wt 211.0 lb

## 2015-04-01 DIAGNOSIS — Z952 Presence of prosthetic heart valve: Secondary | ICD-10-CM

## 2015-04-01 DIAGNOSIS — E785 Hyperlipidemia, unspecified: Secondary | ICD-10-CM | POA: Diagnosis not present

## 2015-04-01 DIAGNOSIS — I517 Cardiomegaly: Secondary | ICD-10-CM | POA: Insufficient documentation

## 2015-04-01 DIAGNOSIS — Z954 Presence of other heart-valve replacement: Secondary | ICD-10-CM | POA: Diagnosis not present

## 2015-04-01 DIAGNOSIS — I350 Nonrheumatic aortic (valve) stenosis: Secondary | ICD-10-CM | POA: Insufficient documentation

## 2015-04-01 DIAGNOSIS — I340 Nonrheumatic mitral (valve) insufficiency: Secondary | ICD-10-CM | POA: Insufficient documentation

## 2015-04-01 DIAGNOSIS — Z6836 Body mass index (BMI) 36.0-36.9, adult: Secondary | ICD-10-CM | POA: Diagnosis not present

## 2015-04-01 DIAGNOSIS — I071 Rheumatic tricuspid insufficiency: Secondary | ICD-10-CM | POA: Diagnosis not present

## 2015-04-01 DIAGNOSIS — E669 Obesity, unspecified: Secondary | ICD-10-CM | POA: Insufficient documentation

## 2015-04-01 DIAGNOSIS — I10 Essential (primary) hypertension: Secondary | ICD-10-CM | POA: Insufficient documentation

## 2015-04-01 DIAGNOSIS — Z87891 Personal history of nicotine dependence: Secondary | ICD-10-CM | POA: Diagnosis not present

## 2015-04-01 NOTE — Progress Notes (Signed)
Cardiology Office Note Date:  04/01/2015   ID:  Ariel Smith, [name] 1946-02-08, MRN [id number]  PCP:  [name], MD  Cardiologist:  Tonny Bollman, MD    Chief Complaint  Patient presents with  . Follow-up   History of Present Illness: Ariel Smith is a 69 y.o. female who presents for 30-day TAVR appointment. She underwent uncomplicated TAVR with a 23 mm Sapien 3 THV via a percutaneous right transfemoral approach 02/19/2015.   She is feeling well s/p TAVR.  She notices improvement in breathing since TAVR and no longer lightheaded.  When questioned about chest pain she reports feeling "uncomfortable" at times and points to the right side of her chest (not necessarily related to exertion) lasting for minutes and resolving spontaneously.  This started several months ago and happens "every so often."    Performs her BADLs and able to do laundry around the house.  She is able to walk around [place] now and she could not do this before her valve was replaced.  Overall, doing better.  Compliant with ASA/Plavix.  Easy bruising but no significant bleeding.   Past Medical History  Diagnosis Date  . Osteoarthritis   . Depression   . Frequent headaches   . HTN (hypertension)   . HLD (hyperlipidemia)   . History of colon polyps 2013    colonoscopy ([name])  . 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 GERD (gastroesophageal reflux disease)   . COPD (chronic obstructive pulmonary disease) (HCC)   . ESRD (end stage renal disease) (HCC) 08/2011    a. on HD (TThSa), L forearm AV fistula, [name]  . Secondary hyperparathyroidism of renal origin (HCC)   . Osteopenia 01/2013  . Bronchiectasis (HCC) 08/2014     suggested by thoracic xray  . Anemia of chronic disease   . Severe aortic stenosis     a. echo 10/2014: EF 60-65%, no RWMA, GR1DD, mod to sev AS (peak vel 377 cm/s, mean gradient 34 mm Hg,  peak gradient 57 mm Hg, valve area (VTA) 0.72 cm^2  . Chronic respiratory failure (HCC)     a. 2/2 COPD; b. on 4-5L via nasal cannula  . Morbid obesity (HCC)   . GIB (gastrointestinal bleeding)     a. leading to cessation of warfarin 11/2014  . Chronic diastolic CHF (congestive heart failure) (HCC)     a. echo 10/2014: EF 60-65%, no RWMA, GR1DD, severe AS, mild MR, PASP 46 mm Hg  . Pulmonary embolism (HCC) 2009  . Renal failure     Patient has been on dialysis for " four years" -per patient.  Marland Kitchen Heart murmur   . 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 Cholecystectomy  2012  . Bunionectomy Left 2003  . Replacement total knee Left 2006  . Shoulder arthroscopy Right 2009  . Colonoscopy  08/2011    colon biopsies, [name]  . Esophagogastroduodenoscopy  08/2011    gastric cardia polyp  . Tonsillectomy  1955  . Tubal ligation  1980  . Exteriorization of a continuous ambulatory peritoneal dialysis catheter  01/2013    removal 12/2103 - [name]  . Hospitalization  12/2013    recurrent R pleural effusion due  to peritoneal fluid translocation s/p rpt thoracentesis with 1.3 L fluid removed, ERSD started on HD this hospitalization  . US echocardiography  12/2013    EF 55-60%, nl LV sys fxn, mild-mod MR, AS, increased LV posterior wall thickness, mild TR  . Esophagogastroduodenoscopy Left 11/11/2014    Procedure: ESOPHAGOGASTRODUODENOSCOPY (EGD);  Surgeon: [name], MD;  Location: [facility] ENDOSCOPY;  Service: Endoscopy;  Laterality: Left;  . Peripheral vascular catheterization N/A 11/12/2014    Procedure: IVC Filter Insertion;  Surgeon: [name], MD;  Location: [facility] INVASIVE CV LAB;  Service: Cardiovascular;  Laterality: N/A;  . Cardiac catheterization N/A 01/18/2015    patent coronary arteries without significant  osbtruction and preserved LV function, severe aortic stenosis; Procedure: Right/Left Heart Cath and Coronary Angiography;  Surgeon: Tonny Bollman, MD  . Transcatheter aortic valve replacement, transfemoral Right 02/19/2015    Procedure: TRANSCATHETER AORTIC VALVE REPLACEMENT, TRANSFEMORAL;  Surgeon: Tonny Bollman, MD;  Location: [facility] OR;  Service: Open Heart Surgery;  Laterality: Right;  . Tee without cardioversion N/A 02/19/2015    Procedure: TRANSESOPHAGEAL ECHOCARDIOGRAM (TEE);  Surgeon: Tonny Bollman, MD;  Location: [facility] OR;  Service: Open Heart Surgery;  Laterality: N/A;    Current Outpatient Prescriptions  Medication Sig Dispense Refill  . acetaminophen (TYLENOL) 500 MG tablet Take 1,000 mg by mouth every 6 (six) hours as needed for mild pain or headache.     . albuterol (VENTOLIN HFA) 108 (90 BASE) MCG/ACT inhaler Inhale 2 puffs into the lungs every 4 (four) hours as needed for wheezing or shortness of breath.     . ALPRAZolam (XANAX) 1 MG tablet Take 1 tablet (1 mg total) by mouth 3 (three) times daily as needed for anxiety. 30 tablet 0  . aspirin EC 325 MG EC tablet Take 1 tablet (325 mg total) by mouth daily. 30 tablet 0  . baclofen (LIORESAL) 10 MG tablet Take 0.5-1 tablets (5-10 mg total) by mouth 2 (two) times daily as needed for muscle spasms. 30 each 0  . budesonide-formoterol (SYMBICORT) 160-4.5 MCG/ACT inhaler Inhale 2 puffs into the lungs 2 (two) times daily.    . calcitRIOL (ROCALTROL) 0.25 MCG capsule Take 0.25 mcg by mouth daily.    . calcium acetate (PHOSLO) 667 MG capsule Take 667-2,001 mg by mouth 3 (three) times daily with meals. Take 3 with meals and 1 with a snack    . citalopram (CELEXA) 20 MG tablet Take 1 tablet (20 mg total) by mouth daily. 90 tablet 3  . clopidogrel (PLAVIX) 75 MG tablet Take 1 tablet (75 mg total) by mouth daily with breakfast. 30 tablet 1  . cyanocobalamin (,VITAMIN B-12,) 1000 MCG/ML injection Inject 1,000 mcg into the muscle every 30 (thirty) days.     Marland Kitchen docusate sodium (COLACE) 100 MG capsule Take 1 capsule (100 mg total) by mouth 2 (two) times daily. (Patient taking differently: Take 100 mg by mouth 2 (two) times daily as needed for mild constipation. ) 10 capsule 0  . donepezil (ARICEPT) 5 MG tablet Take 1 tablet (5 mg total) by mouth at bedtime. 30 tablet 3  . furosemide (LASIX) 20 MG tablet Take 1 tablet (20 mg total) by mouth daily. 30 tablet 3  . hydrOXYzine (ATARAX/VISTARIL) 25 MG tablet Take 0.5 tablets (12.5 mg total) by mouth 2 (two) times daily. 30 tablet 0  . lidocaine-prilocaine (EMLA) cream Apply 1 application topically as needed (topical anesthesia for hemodialysis if Gebauers and Lidocaine injection are ineffective.). 30 g 0  . lovastatin (MEVACOR)  20 MG tablet TAKE ONE TABLET BY MOUTH AT BEDTIME 90 tablet 3  . metoprolol tartrate (LOPRESSOR) 25 MG tablet Take 0.5 tablets (12.5 mg total) by mouth 2 (two) times daily. 60 tablet 1  . Multiple Vitamin (MULTIVITAMIN WITH MINERALS) TABS tablet Take 1 tablet by mouth daily. 1 tablet 0  . oxyCODONE (OXY IR/ROXICODONE) 5 MG immediate release tablet Take 1-2 tablets (5-10 mg total) by mouth every 3 (three) hours as needed for severe pain. (Patient not taking: Reported on 03/11/2015) 30 tablet 0  . pantoprazole (PROTONIX) 40 MG tablet Take 40 mg by mouth daily as needed (for heartburn/indigestion).    . promethazine (PHENERGAN) 12.5 MG tablet Take 12.5 mg by mouth every 6 (six) hours as needed for nausea or vomiting.    . traZODone (DESYREL) 50 MG tablet Take 0.5 tablets (25 mg total) by mouth at bedtime. 30 tablet 3  . zolpidem (AMBIEN) 5 MG tablet TAKE ONE TABLET BY MOUTH AT BEDTIME AS NEEDED 30 tablet 0   No current facility-administered medications for this visit.    Allergies:   Aspirin and Nodolor   Social History:  The patient  reports that she quit smoking about 7 years ago. Her smoking use included Cigarettes. She started smoking about 36 years ago. She has a 60 pack-year  smoking history. She has never used smokeless tobacco. She reports that she does not drink alcohol or use illicit drugs.   Family History:  The patient's  family history includes Cancer in her father; Cancer (age of onset: 53) in her daughter; Cancer (age of onset: 69) in her mother; Deep vein thrombosis in her brother; Dementia (age of onset: 66) in her father; Hypertension in her brother and father; Stroke in her mother. There is no history of Diabetes or CAD.    ROS:  Please see the history of present illness.  Otherwise, review of systems is positive for anxiety, back pain.  All other systems are reviewed and negative.    PHYSICAL EXAM: VS:  BP 140/90 mmHg  Pulse 68  Ht  (1.6 m)  Wt 211 lb (95.709 kg)  BMI 37.39 kg/m2 , BMI Body mass index is 37.39 kg/(m^2). GEN: Well nourished, well developed, in no acute distress HEENT: normal Neck: no JVD, no masses. No carotid bruit. Cardiac: RRR, 2/6 systolic murmur, no rubs/gallops             Respiratory:  Decrease BS bilaterally, no wheezes or rales, normal work of breathing GI: soft, nondistended, + BS, mild TTP LLQ MS: no deformity or atrophy Ext: trace pretibial edema B/L Skin: warm and dry, no rash Neuro:  Grossly intact Psych: euthymic mood, full affect  EKG:  EKG is not ordered today.  Recent Labs: 11/01/2014: TSH 1.27 12/28/2014: B Natriuretic Peptide 2712.0* 01/07/2015: Pro B Natriuretic peptide (BNP) 1113.0* 02/15/2015: ALT 15 02/20/2015: Magnesium 1.6* 02/21/2015: BUN 35*; Creatinine, Ser 7.82*; Hemoglobin 8.3*; Platelets 131*; Potassium 4.2; Sodium 139   Lipid Panel     Component Value Date/Time   CHOL 155 03/30/2014 1724   CHOL 164 05/22/2011 0904   TRIG 275* 03/30/2014 1724   TRIG 258 08/10/2011   TRIG 325* 05/22/2011 0904   HDL 33* 03/30/2014 1724   HDL 33* 05/22/2011 0904   CHOLHDL 4.7 03/30/2014 1724   VLDL 55* 03/30/2014 1724   VLDL 65* 05/22/2011 0904   LDLCALC 67 03/30/2014 1724   LDLCALC 123 08/10/2011     LDLCALC 66 05/22/2011 0904  Wt Readings from Last 3 Encounters:  03/11/15 212 lb (96.163 kg)  02/22/15 212 lb 14.4 oz (96.571 kg)  02/15/15 213 lb 9.6 oz (96.888 kg)     Cardiac Studies Reviewed: 04/01/15 - 2D ECHO  ASSESSMENT AND PLAN: 1. Severe aortic stenosis s/p TAVR - She is 1 month out from surgery.  She also has COPD requiring oxygen but from a heart standpoint symptoms seem to be improving.  The occasional right sided chest discomfort she describes is atypical and does not sound like angina.  Will continue ASA/Plavix x 6 months and then have her primary cardiologist re-evaluate need for continued anti-platelet vs A/C due to her hx of recurrent VTE.  2. Chronic diastolic HF - NYHF class 2.  Managed by [name]. Medical therapy reviewed and will be continued without change. Volume management with hemodialysis.   3. HTN - stable.  4. Hx of VTE - previously on coumadin.  Now with IVC filter due to GI bleed.  Will need to consider continued antiplatelet vs A/C after her 6 months of DAPT.  Current medicines are reviewed with the patient today.  The patient does not have concerns regarding medicines.  Labs/ tests ordered today include: none No orders of the defined types were placed in this encounter.   Disposition:   FU in 11 months for 2D ECHO.  ADDENDUM: Patient seen, examined. Available data reviewed. Agree with findings, assessment, and plan as outlined by Dr Andrey Campanile. I have made changes where appropriate. The patient is doing very well after TAVR and she reports improved QOL and functional capacity, now able to shop with minimal dyspnea (NYHA 2). Echo images reviewed and demonstrates normal aortic valve bioprosthetic function with mildly elevated gradients and no AI. Suspect elevated gradient may be related to flow with her AV fistula as we have commonly seen this in hemodialysis patients in the past. Plan as outlined above. Plan: FU Dr Mariah Milling 3 months. Will see back in  valve clinic for her one year visit.   Ariel Bi, MD  04/01/2015 3:58 PM    [facility] [address] [place], [place], [place]  [address] Phone: [phone]; Fax: [phone]

## 2015-04-01 NOTE — Patient Instructions (Signed)
Medication Instructions:  Your physician recommends that you continue on your current medications as directed. Please refer to the Current Medication list given to you today.  Labwork: No new orders.   Testing/Procedures: Your physician has requested that you have an echocardiogram in 1 YEAR. Echocardiography is a painless test that uses sound waves to create images of your heart. It provides your doctor with information about the size and shape of your heart and how well your heart's chambers and valves are working. This procedure takes approximately one hour. There are no restrictions for this procedure.  Follow-Up: Your physician recommends that you schedule a follow-up appointment in: 3 MONTHS with Dr Mariah Milling  Your physician wants you to follow-up in: 1 YEAR with Dr Excell Seltzer.  You will receive a reminder letter in the mail two months in advance. If you don't receive a letter, please call our office to schedule the follow-up appointment.   Any Other Special Instructions Will Be Listed Below (If Applicable).     If you need a refill on your cardiac medications before your next appointment, please call your pharmacy.

## 2015-04-08 ENCOUNTER — Ambulatory Visit (INDEPENDENT_AMBULATORY_CARE_PROVIDER_SITE_OTHER): Payer: Medicare Other | Admitting: Family Medicine

## 2015-04-08 ENCOUNTER — Encounter: Payer: Self-pay | Admitting: Family Medicine

## 2015-04-08 ENCOUNTER — Encounter: Payer: Self-pay | Admitting: Radiology

## 2015-04-08 VITALS — BP 142/80 | HR 68 | Temp 97.4°F | Wt 211.8 lb

## 2015-04-08 DIAGNOSIS — I10 Essential (primary) hypertension: Secondary | ICD-10-CM | POA: Diagnosis not present

## 2015-04-08 DIAGNOSIS — Z952 Presence of prosthetic heart valve: Secondary | ICD-10-CM

## 2015-04-08 DIAGNOSIS — G4700 Insomnia, unspecified: Secondary | ICD-10-CM

## 2015-04-08 DIAGNOSIS — E538 Deficiency of other specified B group vitamins: Secondary | ICD-10-CM

## 2015-04-08 DIAGNOSIS — I5032 Chronic diastolic (congestive) heart failure: Secondary | ICD-10-CM

## 2015-04-08 DIAGNOSIS — Z954 Presence of other heart-valve replacement: Secondary | ICD-10-CM | POA: Diagnosis not present

## 2015-04-08 DIAGNOSIS — J449 Chronic obstructive pulmonary disease, unspecified: Secondary | ICD-10-CM

## 2015-04-08 DIAGNOSIS — N186 End stage renal disease: Secondary | ICD-10-CM

## 2015-04-08 DIAGNOSIS — Z992 Dependence on renal dialysis: Secondary | ICD-10-CM

## 2015-04-08 MED ORDER — CYANOCOBALAMIN 1000 MCG/ML IJ SOLN
1000.0000 ug | Freq: Once | INTRAMUSCULAR | Status: AC
  Administered 2015-04-08: 1000 ug via INTRAMUSCULAR

## 2015-04-08 MED ORDER — ALBUTEROL SULFATE HFA 108 (90 BASE) MCG/ACT IN AERS: 2.0000 | INHALATION_SPRAY | RESPIRATORY_TRACT | Status: AC | PRN

## 2015-04-08 NOTE — Addendum Note (Signed)
Addended by: Josph Macho A on: 04/08/2015 12:36 PM   Modules accepted: Orders

## 2015-04-08 NOTE — Progress Notes (Signed)
Pre visit review using our clinic review tool, if applicable. No additional management support is needed unless otherwise documented below in the visit note. 

## 2015-04-08 NOTE — Patient Instructions (Addendum)
For sleeping medicine - start with trazodone. If not helping, may take an [place].  Urine drug screen today.  I think you are doing well today.  Good to see you today, call us with questions.  Return in 3-4 months for medicare wellness visit B12 shot today.

## 2015-04-08 NOTE — Assessment & Plan Note (Addendum)
B12 shot today. Lab Results  Component Value Date   VITAMINB12 300 11/01/2014

## 2015-04-08 NOTE — Assessment & Plan Note (Signed)
Doing very well after TAVR last month.

## 2015-04-08 NOTE — Progress Notes (Signed)
BP 148/86 mmHg  Pulse 68  Temp(Src) 97.4 F (36.3 C) (Oral)  Wt 211 lb 12 oz (96.049 kg)   CC: f/u visit  Subjective:    Patient ID: Ariel Smith, female    DOB: 05-06-1945, 69 y.o.   MRN: [id number]  HPI: Ariel Smith is a 69 y.o. female presenting on 04/08/2015 for Follow-up   Underwent TAVR for severe aortic stenosis 02/2015 and had f/u with cardiology and cardiothoracic surgery - has noticed significant improvement in breathing and dizziness. Continues aspirin and plavix for 6 months total.   Oxygen dependent COPD (3L). Regular with symbicort. Prn albuterol, no recent need.  Chronic diastolic CHF - overall euvolemic  H/o DVT prior on coumadin now with IVC filter  ESRD on HD TThSat ([facility])  Noticing worsening left arm shaking. MDD - on celexa  daily and xanax  TID PRN.   Unsure how she's taking controlled substances - on ambien, oxycodone, xanax.   Ongoing R lower back pain.   Relevant past medical, surgical, family and social history reviewed and updated as indicated. Interim medical history since our last visit reviewed. Allergies and medications reviewed and updated. Current Outpatient Prescriptions on File Prior to Visit  Medication Sig  . acetaminophen (TYLENOL) 500 MG tablet Take 1,000 mg by mouth every 6 (six) hours as needed for mild pain or headache.   . ALPRAZolam (XANAX) 1 MG tablet Take 1 tablet (1 mg total) by mouth 3 (three) times daily as needed for anxiety.  Marland Kitchen aspirin EC 325 MG EC tablet Take 1 tablet (325 mg total) by mouth daily.  . baclofen (LIORESAL) 10 MG tablet Take 0.5-1 tablets (5-10 mg total) by mouth 2 (two) times daily as needed for muscle spasms.  . budesonide-formoterol (SYMBICORT) 160-4.5 MCG/ACT inhaler Inhale 2 puffs into the lungs 2 (two) times daily.  . calcitRIOL (ROCALTROL) 0.25 MCG capsule Take 0.25 mcg by mouth daily.  . calcium acetate (PHOSLO) 667 MG capsule Take 667-2,001 mg by mouth 3 (three) times daily with meals. Take  3 with meals and 1 with a snack  . citalopram (CELEXA) 20 MG tablet Take 1 tablet (20 mg total) by mouth daily.  . clopidogrel (PLAVIX) 75 MG tablet Take 1 tablet (75 mg total) by mouth daily with breakfast.  . cyanocobalamin (,VITAMIN B-12,) 1000 MCG/ML injection Inject 1,000 mcg into the muscle every 30 (thirty) days.  Marland Kitchen docusate sodium (COLACE) 100 MG capsule Take 1 capsule (100 mg total) by mouth 2 (two) times daily. (Patient taking differently: Take 100 mg by mouth 2 (two) times daily as needed for mild constipation. )  . donepezil (ARICEPT) 5 MG tablet Take 1 tablet (5 mg total) by mouth at bedtime.  . furosemide (LASIX) 20 MG tablet Take 1 tablet (20 mg total) by mouth daily.  . hydrOXYzine (ATARAX/VISTARIL) 25 MG tablet Take 0.5 tablets (12.5 mg total) by mouth 2 (two) times daily.  Marland Kitchen lidocaine-prilocaine (EMLA) cream Apply 1 application topically as needed (topical anesthesia for hemodialysis if Gebauers and Lidocaine injection are ineffective.).  Marland Kitchen lovastatin (MEVACOR) 20 MG tablet TAKE ONE TABLET BY MOUTH AT BEDTIME  . metoprolol tartrate (LOPRESSOR) 25 MG tablet Take 0.5 tablets (12.5 mg total) by mouth 2 (two) times daily.  . Multiple Vitamin (MULTIVITAMIN WITH MINERALS) TABS tablet Take 1 tablet by mouth daily.  . pantoprazole (PROTONIX) 40 MG tablet Take 40 mg by mouth daily as needed (for heartburn/indigestion).  . promethazine (PHENERGAN) 12.5 MG tablet Take 12.5 mg by  mouth every 6 (six) hours as needed for nausea or vomiting.  . traZODone (DESYREL) 50 MG tablet Take 0.5 tablets (25 mg total) by mouth at bedtime.  Marland Kitchen zolpidem (AMBIEN) 5 MG tablet TAKE ONE TABLET BY MOUTH AT BEDTIME AS NEEDED   No current facility-administered medications on file prior to visit.    Review of Systems Per HPI unless specifically indicated in ROS section     Objective:    BP 148/86 mmHg  Pulse 68  Temp(Src) 97.4 F (36.3 C) (Oral)  Wt 211 lb 12 oz (96.049 kg)  Wt Readings from Last 3  Encounters:  04/08/15 211 lb 12 oz (96.049 kg)  04/01/15 211 lb (95.709 kg)  03/11/15 212 lb (96.163 kg)    Physical Exam  Constitutional: She appears well-developed and well-nourished. No distress.  HENT:  Mouth/Throat: Oropharynx is clear and moist. No oropharyngeal exudate.  Eyes: Conjunctivae and EOM are normal. Pupils are equal, round, and reactive to light.  Neck: Normal range of motion. Neck supple. No thyromegaly present.  Cardiovascular: Normal rate, regular rhythm and intact distal pulses.   Murmur (2/6 sem) heard. Pulmonary/Chest: Effort normal and breath sounds normal. No respiratory distress. She has no wheezes. She has no rales.  Musculoskeletal: She exhibits no edema.  Skin: Skin is dry. No rash noted.  Psychiatric: She has a normal mood and affect.  Nursing note and vitals reviewed.      Assessment & Plan:   Problem List Items Addressed This Visit    Vitamin B12 deficiency    B12 shot today. Lab Results  Component Value Date   VITAMINB12 300 11/01/2014         S/P TAVR (transcatheter aortic valve replacement) - Primary    Doing very well after TAVR last month.      Insomnia    Discussed trazodone and ambien use. Update UDS and controlled substance agreement today.      HTN (hypertension) (Chronic)    Mildly elevated today. Continue to monitor. Tomorrow has scheduled HD.      ESRD (end stage renal disease) on dialysis (HCC) (Chronic)    Continue HD regularly Thedore Mins).      COPD (chronic obstructive pulmonary disease) (HCC) (Chronic)    Stable period. Improvement after TAVR. Refilled albuterol. Finding less need for oxygen use.       Relevant Medications   albuterol (VENTOLIN HFA) 108 (90 BASE) MCG/ACT inhaler   Chronic diastolic CHF (congestive heart failure) (HCC)    Seems euvolemic today.          Follow up plan: Return in about 3 months (around 07/07/2015), or as needed, for medicare wellness visit.

## 2015-04-08 NOTE — Assessment & Plan Note (Addendum)
Stable period. Improvement after TAVR. Refilled albuterol. Finding less need for oxygen use.

## 2015-04-08 NOTE — Assessment & Plan Note (Signed)
Seems euvolemic today.  

## 2015-04-08 NOTE — Assessment & Plan Note (Signed)
Continue HD regularly Thedore Mins).

## 2015-04-08 NOTE — Assessment & Plan Note (Signed)
Mildly elevated today. Continue to monitor. Tomorrow has scheduled HD.

## 2015-04-08 NOTE — Assessment & Plan Note (Signed)
Discussed trazodone and ambien use. Update UDS and controlled substance agreement today.

## 2015-04-26 ENCOUNTER — Other Ambulatory Visit: Payer: Self-pay | Admitting: Family Medicine

## 2015-04-26 NOTE — Telephone Encounter (Signed)
plz phone in. 

## 2015-04-26 NOTE — Telephone Encounter (Signed)
Last filled 03/11/2015--please advise

## 2015-04-26 NOTE — Telephone Encounter (Signed)
Rx called in as directed.   

## 2015-04-29 ENCOUNTER — Other Ambulatory Visit: Payer: Self-pay

## 2015-04-29 MED ORDER — CLOPIDOGREL BISULFATE 75 MG PO TABS: 75.0000 mg | ORAL_TABLET | Freq: Every day | ORAL | Status: DC

## 2015-05-03 ENCOUNTER — Encounter: Payer: Self-pay | Admitting: Family Medicine

## 2015-05-12 ENCOUNTER — Other Ambulatory Visit: Payer: Self-pay | Admitting: Family Medicine

## 2015-05-13 ENCOUNTER — Other Ambulatory Visit: Payer: Self-pay | Admitting: *Deleted

## 2015-05-13 ENCOUNTER — Telehealth: Payer: Self-pay | Admitting: Family Medicine

## 2015-05-13 MED ORDER — FUROSEMIDE 20 MG PO TABS: 20.0000 mg | ORAL_TABLET | Freq: Every day | ORAL | Status: DC

## 2015-05-13 NOTE — Telephone Encounter (Signed)
[facility] Night - Client TELEPHONE ADVICE RECORD   [facility]    --------------------------------------------------------------------------------   Patient Name: Ariel Smith  Gender: Female  DOB: 12-20-1945   Age: 70 Y 64 D  Return Phone Number: [phone] (Primary)  Address:     City/State/Zip:  [place]     Client [facility] Night - Client  Client Site [facility] - Night  Physician [name]   Contact Type Call  Call Type Triage / Clinical  Caller Name Lovette Cliche   Relationship To Patient Daughter  Return Phone Number [phone] (Primary)  Chief Complaint Blood Pressure High  Initial Comment Caller sttaes her mother fell head first into a dresser. The pt's BP is high 190/98. The pt has been wobbly and unsteady on her feet. What can the caller do to help pt with high blood pressure? The caller would like an appt for pt to see the doc soon.   PreDisposition Call Doctor       Nurse Assessment  Nurse: Logan Bores, RN, Efraim Kaufmann Date/Time Lamount Cohen Time): 05/13/2015 5:54:10 PM  Confirm and document reason for call. If symptomatic, describe symptoms. You must click the next button to save text entered. ---Caller states her mother fell head first into a dresser. The pt's BP is high 190/98. The pt has been wobbly and unsteady on her feet. What can the caller do to help pt with high blood pressure? The caller would like an appt for pt to see the doc soon.    Has the patient traveled out of the country within the last 30 days? ---Not Applicable    Does the patient have any new or worsening symptoms? ---Yes    Will a triage be completed? ---Yes    Related visit to physician within the last 2 weeks? ---No    Does the PT have any chronic conditions? (i.e. diabetes, asthma, etc.) ---Yes    List chronic conditions. ---Dialysis 3 days weekly, only takes Midrin on dialysis days, Ariceft-"Memory"  early stages of Dementia, Aortic Valve Repair of Stensis Nov. 2016, CHF    Is this a behavioral health or substance abuse call? ---No           Guidelines          Guideline Title Affirmed Question Affirmed Notes Nurse Date/Time (Eastern Time)  High Blood Pressure [1] BP ? 130/80 AND [2] history of heart problems, kidney disease or diabetes    Evans, RN, Melissa 05/13/2015 6:01:28 PM  Dizziness - Vertigo SEVERE dizziness (vertigo) (e.g., unable to walk without assistance)    Logan Bores, RN, Melissa 05/13/2015 6:06:47 PM    Disp. Time Lamount Cohen Time) Disposition Final User    05/13/2015 6:05:44 PM See PCP within 2 Glorious Peach, RN, Efraim Kaufmann      05/13/2015 6:08:22 PM Go to ED Now (or PCP triage) Era Skeen, RN, Efraim Kaufmann            Caller Understands: Yes  Disagree/Comply: Comply           Caller Understands: Yes  Disagree/Comply: Disagree  Disagree/Comply Reason: Disagree with instructions    Care Advice Given Per Guideline        SEE PCP WITHIN 2 WEEKS: You need an evaluation for this ongoing problem within the next 2 weeks. Call your doctor during regular office hours and make an appointment. REASSURANCE: * Your blood pressure is elevated but you have told me that you  are not having any symptoms. CALL BACK IF: * You become worse. * Your blood pressure is over 160/100 * Chest pain or difficulty breathing occurs * Difficulty walking, difficulty talking, or severe headache occurs * Weakness or numbness of the face, arm or leg on one side of the body occurs  GO TO ED NOW (OR PCP TRIAGE): NOTE TO TRIAGER - DRIVING: * Another adult should drive. AMBULANCE TRANSPORT (NOTE TO TRIAGER): If the patient cannot walk at all because of severe dizziness, then the patient may need to be transported via ambulance. The patient or family members can arrange ambulance transport via private ambulance company or via EMS 911.    After Care Instructions Given        Call Event Type User Date / Time Description         --------------------------------------------------------------------------------         Comments  User: Ardeen Garland, RN Date/Time Lamount Cohen Time): 05/13/2015 6:11:18 PM  Caller is wanting an appt with the physician for late afternoon to discuss medications ASAP, daughter Lynnell Dike wants to be present with the pt when she goes to appt. Caller reports her mother refused to go to ER and the believe the dementia is getting worse.    Referrals  GO TO FACILITY REFUSED  GO TO FACILITY REFUSED  GO TO FACILITY REFUSED

## 2015-05-14 NOTE — Telephone Encounter (Signed)
Message left advising patient's daughter, Lynnell Dike.

## 2015-05-14 NOTE — Telephone Encounter (Signed)
Spoke with Ariel Smith pts daughter; pt cannot come to office today because of dialysis; Ariel Smith wants to come with her mom to appt but conflict with work; scheduled appt with Dr Reece Agar on 05/15/15 at 11:15. Advised if pt condition changes or worsens to call [facility] or at night go to ED for eval. Ariel Smith voiced understanding.

## 2015-05-14 NOTE — Telephone Encounter (Signed)
plz have them check bp twice today and tomorrow morning and will see on Wednesday. Agree with urgent eval if worsening sxs.

## 2015-05-15 ENCOUNTER — Encounter: Payer: Self-pay | Admitting: Family Medicine

## 2015-05-15 ENCOUNTER — Ambulatory Visit (INDEPENDENT_AMBULATORY_CARE_PROVIDER_SITE_OTHER): Payer: Medicare Other | Admitting: Family Medicine

## 2015-05-15 VITALS — BP 142/98 | HR 68 | Temp 97.6°F | Wt 208.5 lb

## 2015-05-15 DIAGNOSIS — R05 Cough: Secondary | ICD-10-CM | POA: Diagnosis not present

## 2015-05-15 DIAGNOSIS — R053 Chronic cough: Secondary | ICD-10-CM

## 2015-05-15 DIAGNOSIS — I10 Essential (primary) hypertension: Secondary | ICD-10-CM | POA: Diagnosis not present

## 2015-05-15 DIAGNOSIS — R413 Other amnesia: Secondary | ICD-10-CM

## 2015-05-15 DIAGNOSIS — N186 End stage renal disease: Secondary | ICD-10-CM

## 2015-05-15 DIAGNOSIS — R350 Frequency of micturition: Secondary | ICD-10-CM | POA: Diagnosis not present

## 2015-05-15 DIAGNOSIS — W06XXXA Fall from bed, initial encounter: Secondary | ICD-10-CM

## 2015-05-15 DIAGNOSIS — E538 Deficiency of other specified B group vitamins: Secondary | ICD-10-CM

## 2015-05-15 DIAGNOSIS — G4700 Insomnia, unspecified: Secondary | ICD-10-CM

## 2015-05-15 DIAGNOSIS — Z992 Dependence on renal dialysis: Secondary | ICD-10-CM

## 2015-05-15 LAB — POC URINALSYSI DIPSTICK (AUTOMATED)
BILIRUBIN UA: NEGATIVE
GLUCOSE UA: NEGATIVE
Ketones, UA: NEGATIVE
NITRITE UA: NEGATIVE
RBC UA: NEGATIVE
Spec Grav, UA: 1.015
Urobilinogen, UA: 0.2
pH, UA: 7.5

## 2015-05-15 MED ORDER — TRAZODONE HCL 50 MG PO TABS: 50.0000 mg | ORAL_TABLET | Freq: Every day | ORAL | Status: DC

## 2015-05-15 MED ORDER — PANTOPRAZOLE SODIUM 40 MG PO TBEC: 40.0000 mg | DELAYED_RELEASE_TABLET | Freq: Every day | ORAL | Status: DC | PRN

## 2015-05-15 MED ORDER — HYDROXYZINE HCL 25 MG PO TABS: 12.5000 mg | ORAL_TABLET | Freq: Every day | ORAL | Status: DC

## 2015-05-15 MED ORDER — CYANOCOBALAMIN 1000 MCG/ML IJ SOLN
1000.0000 ug | Freq: Once | INTRAMUSCULAR | Status: AC
  Administered 2015-05-15: 1000 ug via INTRAMUSCULAR

## 2015-05-15 MED ORDER — GUAIFENESIN-CODEINE 100-10 MG/5ML PO SYRP: 5.0000 mL | ORAL_SOLUTION | Freq: Every day | ORAL | Status: DC | PRN

## 2015-05-15 MED ORDER — ALPRAZOLAM 1 MG PO TABS: 1.0000 mg | ORAL_TABLET | Freq: Every day | ORAL | Status: DC | PRN

## 2015-05-15 NOTE — Progress Notes (Addendum)
BP 142/98 mmHg  Pulse 68  Temp(Src) 97.6 F (36.4 C) (Oral)  Wt 208 lb 8 oz (94.575 kg)   CC: check BP  Subjective:    Patient ID: Ariel Smith, female    DOB: 1945/09/06, 70 y.o.   MRN: [id number]  HPI: Ariel Smith is a 70 y.o. female presenting on 05/15/2015 for Hypertension   Presents with daughter and husband. Daughter very involved, takes care of medications.   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 On aspirin/plavix x6 mo.  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 ESRD on HD TThSat [facility]).   See recent phone note - pt fell head first into dresser yesterday morning. Also noticing elevated BP last night to 190/98 and concern for worsening memory trouble (progressively worsening despite this). She is on aricept 5mg  nightly. Refused ER visit rec from [facility].   Dozing off on edge of bed, fell forward and thinks she hit her head but no residual pain or headaches. No LOC, dizziness or presyncope.   Saturday BP noted elevated to 200s systolic at dialysis center. When picked up from dialysis more confusion, imbalance noted. Sunday and Monday did some better. Then Tuesday after dialysis again noted some imbalance and confusion.   She has noted increased urinary frequency. No dysuria, hematuria, abd or flank pain, nausea/vomiting, fevers/chills.   BP regimen includes lasix 20mg  daily and metoprolol 12.5mg  (1/2 tab of 25mg  dose) BID. She takes midrodine on mornings prior to dialysis - but may have been taking daily.   Reviewed sleep meds and changes made - see below.   Relevant past medical, surgical, family and social history reviewed and updated as indicated. Interim medical history since our last visit reviewed. Allergies and medications reviewed and updated. Current Outpatient Prescriptions on File Prior to Visit  Medication Sig  . acetaminophen (TYLENOL) 500 MG tablet Take 1,000 mg by mouth every 6 (six) hours as needed for mild pain or headache.   . albuterol (VENTOLIN HFA)  108 (90 BASE) MCG/ACT inhaler Inhale 2 puffs into the lungs every 4 (four) hours as needed for wheezing or shortness of breath.  Marland Kitchen aspirin EC 325 MG EC tablet Take 1 tablet (325 mg total) by mouth daily.  . baclofen (LIORESAL) 10 MG tablet Take 0.5-1 tablets (5-10 mg total) by mouth 2 (two) times daily as needed for muscle spasms.  . budesonide-formoterol (SYMBICORT) 160-4.5 MCG/ACT inhaler Inhale 2 puffs into the lungs 2 (two) times daily.  . calcitRIOL (ROCALTROL) 0.25 MCG capsule Take 0.25 mcg by mouth daily.  . calcium acetate (PHOSLO) 667 MG capsule Take 667-2,001 mg by mouth 3 (three) times daily with meals. Take 3 with meals and 1 with a snack  . citalopram (CELEXA) 20 MG tablet Take 1 tablet (20 mg total) by mouth daily.  . clopidogrel (PLAVIX) 75 MG tablet Take 1 tablet (75 mg total) by mouth daily with breakfast.  . cyanocobalamin (,VITAMIN B-12,) 1000 MCG/ML injection Inject 1,000 mcg into the muscle every 30 (thirty) days.  Marland Kitchen docusate sodium (COLACE) 100 MG capsule Take 1 capsule (100 mg total) by mouth 2 (two) times daily. (Patient taking differently: Take 100 mg by mouth 2 (two) times daily as needed for mild constipation. )  . donepezil (ARICEPT) 5 MG tablet TAKE ONE TABLET BY MOUTH AT BEDTIME  . furosemide (LASIX) 20 MG tablet Take 1 tablet (20 mg total) by mouth daily.  Marland Kitchen lidocaine-prilocaine (EMLA) cream Apply 1 application topically as needed (topical anesthesia for hemodialysis if  Gebauers and Lidocaine injection are ineffective.).  Marland Kitchen lovastatin (MEVACOR) 20 MG tablet TAKE ONE TABLET BY MOUTH AT BEDTIME  . metoprolol tartrate (LOPRESSOR) 25 MG tablet Take 0.5 tablets (12.5 mg total) by mouth 2 (two) times daily.  . Multiple Vitamin (MULTIVITAMIN WITH MINERALS) TABS tablet Take 1 tablet by mouth daily.  . promethazine (PHENERGAN) 12.5 MG tablet Take 12.5 mg by mouth every 6 (six) hours as needed for nausea or vomiting.  . traMADol (ULTRAM) 50 MG tablet Take 1 tablet (50 mg total)  by mouth every 8 (eight) hours as needed.   No current facility-administered medications on file prior to visit.    Review of Systems Per HPI unless specifically indicated in ROS section     Objective:    BP 142/98 mmHg  Pulse 68  Temp(Src) 97.6 F (36.4 C) (Oral)  Wt 208 lb 8 oz (94.575 kg)  Wt Readings from Last 3 Encounters:  05/15/15 208 lb 8 oz (94.575 kg)  04/08/15 211 lb 12 oz (96.049 kg)  04/01/15 211 lb (95.709 kg)    Physical Exam  Constitutional: She appears well-developed and well-nourished. No distress.  Tired appearing  HENT:  Mouth/Throat: Oropharynx is clear and moist. No oropharyngeal exudate.  Small bruise R forehead  Cardiovascular: Normal rate, regular rhythm, normal heart sounds and intact distal pulses.   No murmur heard. Pulmonary/Chest: Effort normal and breath sounds normal. No respiratory distress. She has no wheezes. She has no rales.  Musculoskeletal: She exhibits no edema.  Skin: Skin is warm and dry. No rash noted.  Psychiatric: She has a normal mood and affect.  Nursing note and vitals reviewed.     Assessment & Plan:   Problem List Items Addressed This Visit    Vitamin B12 deficiency    b12 shot today. Lab Results  Component Value Date   VITAMINB12 300 11/01/2014         Relevant Medications   cyanocobalamin ((VITAMIN B-12)) injection 1,000 mcg (Completed)   Memory deficit    Daughter worried this may have recently worsened. She is on several psychotropics which could have an effect on her memory - will simplify regimen by stopping ambien, decreasing xanax to PRN HD. See pt instructions for details. Check UA to help r/o UTI as cause of worsened confusion. Continue aricept  daily. Recheck memory at next visit.      Insomnia    Reviewed sleep hygiene. She was on 4 different sedation medications and sleep aides including trazodone, ambien, xanax and hydroxyzine. Will simplify regimen - d/c ambien (given cognitive concerns) and  increase trazodone to  nightly.  Update with effect.      HTN (hypertension) - Primary (Chronic)    Again elevated today but overall stable. Concern she may have been taking her midodrine every day, not just on dialysis days. New plan is for patient to take pill with her to dialysis and then will receive at dialysis if needed. Reviewed metoprolol and lasix dosing otherwise.       Relevant Medications   midodrine (PROAMATINE) 5 MG tablet   Fall from bed    Anticipate mild abrasion of forehead. No evidence of concussion or further injury.      ESRD (end stage renal disease) on dialysis (HCC) (Chronic)    Appreciate renal care of patient.      Chronic cough    No evidence of infection today - continue to monitor. Ok to use cheratussin for cough.  Other Visit Diagnoses    Urine frequency        Relevant Orders    POCT Urinalysis Dipstick (Automated) (Completed)    Urine culture        Follow up plan: Return in about 2 months (around 07/13/2015) for medicare wellness visit.

## 2015-05-15 NOTE — Progress Notes (Signed)
Pre visit review using our clinic review tool, if applicable. No additional management support is needed unless otherwise documented below in the visit note. 

## 2015-05-15 NOTE — Patient Instructions (Addendum)
B12 shot today Check urinalysis today Let's stop ambien. Increase trazodone to 50mg  nightly and update me with effect. Try to minimize daytime naps, get plenty of sun light during the day.  Ok to continue hydroxyzine. Let's change xanax to once daily on dialysis days.  Try codeine cough syrup for coughing fits. Return in 2 months for wellness visit, try to schedule sooner than April.

## 2015-05-17 DIAGNOSIS — W06XXXA Fall from bed, initial encounter: Secondary | ICD-10-CM | POA: Insufficient documentation

## 2015-05-17 LAB — TYPE AND SCREEN
ABO/RH(D): O POS
Antibody Screen: NEGATIVE
UNIT DIVISION: 0
Unit division: 0
Unit division: 0
Unit division: 0

## 2015-05-17 NOTE — Addendum Note (Signed)
Addended by: [name] on: 05/17/2015 01:24 AM   Modules accepted: Orders, SmartSet

## 2015-05-17 NOTE — Assessment & Plan Note (Signed)
Anticipate mild abrasion of forehead. No evidence of concussion or further injury.

## 2015-05-17 NOTE — Assessment & Plan Note (Signed)
b12 shot today. Lab Results  Component Value Date   VITAMINB12 300 11/01/2014

## 2015-05-17 NOTE — Assessment & Plan Note (Signed)
Appreciate renal care of patient. 

## 2015-05-17 NOTE — Assessment & Plan Note (Addendum)
Reviewed sleep hygiene. She was on 4 different sedation medications and sleep aides including trazodone, ambien, xanax and hydroxyzine. Will simplify regimen - d/c ambien (given cognitive concerns) and increase trazodone to 50mg  nightly.  Update with effect.

## 2015-05-17 NOTE — Assessment & Plan Note (Addendum)
Daughter worried this may have recently worsened. She is on several psychotropics which could have an effect on her memory - will simplify regimen by stopping ambien, decreasing xanax to PRN HD. See pt instructions for details. Check UA to help r/o UTI as cause of worsened confusion. Continue aricept 5mg  daily. Recheck memory at next visit.

## 2015-05-17 NOTE — Assessment & Plan Note (Signed)
No evidence of infection today - continue to monitor. Ok to use cheratussin for cough.

## 2015-05-17 NOTE — Assessment & Plan Note (Signed)
Again elevated today but overall stable. Concern she may have been taking her midodrine every day, not just on dialysis days. New plan is for patient to take pill with her to dialysis and then will receive at dialysis if needed. Reviewed metoprolol and lasix dosing otherwise.

## 2015-05-22 NOTE — Addendum Note (Signed)
Addended by: Alvina Chou on: 05/22/2015 05:03 PM   Modules accepted: Orders

## 2015-05-23 LAB — URINE CULTURE

## 2015-06-28 ENCOUNTER — Other Ambulatory Visit: Payer: Self-pay | Admitting: *Deleted

## 2015-06-28 MED ORDER — METOPROLOL TARTRATE 25 MG PO TABS: 12.5000 mg | ORAL_TABLET | Freq: Two times a day (BID) | ORAL | Status: DC

## 2015-07-03 ENCOUNTER — Encounter: Payer: Self-pay | Admitting: Cardiovascular Disease

## 2015-07-03 ENCOUNTER — Ambulatory Visit (INDEPENDENT_AMBULATORY_CARE_PROVIDER_SITE_OTHER): Payer: Medicare Other | Admitting: Cardiovascular Disease

## 2015-07-03 VITALS — BP 120/68 | HR 79 | Ht 64.0 in | Wt 208.0 lb

## 2015-07-03 DIAGNOSIS — Z954 Presence of other heart-valve replacement: Secondary | ICD-10-CM

## 2015-07-03 DIAGNOSIS — I10 Essential (primary) hypertension: Secondary | ICD-10-CM | POA: Diagnosis not present

## 2015-07-03 DIAGNOSIS — Z952 Presence of prosthetic heart valve: Secondary | ICD-10-CM

## 2015-07-03 DIAGNOSIS — J449 Chronic obstructive pulmonary disease, unspecified: Secondary | ICD-10-CM

## 2015-07-03 DIAGNOSIS — I350 Nonrheumatic aortic (valve) stenosis: Secondary | ICD-10-CM

## 2015-07-03 DIAGNOSIS — R0602 Shortness of breath: Secondary | ICD-10-CM

## 2015-07-03 DIAGNOSIS — I5032 Chronic diastolic (congestive) heart failure: Secondary | ICD-10-CM

## 2015-07-03 DIAGNOSIS — I82509 Chronic embolism and thrombosis of unspecified deep veins of unspecified lower extremity: Secondary | ICD-10-CM

## 2015-07-03 NOTE — Assessment & Plan Note (Signed)
Main complaint today is shortness of breath on exertion. Etiology likely multifactorial including obesity, deconditioning, anemia, COPD She will talk to nephrology about her anemia Suggested she consider reestablishing with a new pulmonary physician. She will make appointment with [facility]

## 2015-07-03 NOTE — Patient Instructions (Addendum)
  No medication changes were made.  Shortness of breath likely from anemia, COPD We will schedule you an appt w/ pulmonology  Talk with Kidney doctor about procrit shot to reach goal of HCT of >30  Ask Lateef about coumadin/warfarin  Please call us if you have new issues that need to be addressed before your next appt.  Your physician wants you to follow-up in: 6 months.  You will receive a reminder letter in the mail two months in advance. If you don't receive a letter, please call our office to schedule the follow-up appointment.

## 2015-07-03 NOTE — Progress Notes (Signed)
Patient ID: Ariel Smith, female    DOB: 06-Nov-1945, 70 y.o.   MRN: [id number]  HPI Comments: 70 y.o. female with h/o end-stage renal disease, on hemodialysis, followed by [name], severe aortic valve stenosis, GI bleed the beginning of August 2016, her warfarin held, hypertension, previous DVT/PE, COPD, chronic shortness of breath, presenting after TAVR in early November 2016. She presents for follow-up of her prosthetic aortic valve Currently on aspirin and Plavix for 6 months  She reports that she is doing well but continues to have chronic shortness of breath She had PFTs October 2016 showing severe perfusion abnormality She wears chronic oxygen, 2 L Previously seen by Dr. Meredeth Ide, was not happy with his care and would like to change pulmonologist She denies any leg edema, reports compliance with her dialysis She has chronic anemia. Last recorded hematocrit 26 She is uncertain if she is receiving iron or procrit at dialysis She does dialysis on Tuesday, Thursday, Saturday, dry weight 94 kg per the patient She does take Lasix daily, reports making good urine  No regular exercise program. She is not interested in cardiac or pulmonary rehabilitation  EKG on today's visit shows normal sinus rhythm with rate 79 bpm, prolonged QT interval, no significant ST or T-wave changes  Other past medical history Previous hospital admissions for hypoxia, placed on BiPAP, improved with dialysis  She is on warfarin for history of DVT and PE , status post IVC filter anemia on presentation, off warfarin    Allergies  Allergen Reactions  . Aspirin Other (See Comments)    Pt is unable to take because she has kidney disease.     Gaspar Skeeters [Isometheptene-Dichloral-Apap] Other (See Comments)    Reaction:  Headaches     Current Outpatient Prescriptions on File Prior to Visit  Medication Sig Dispense Refill  . acetaminophen (TYLENOL) 500 MG tablet Take 1,000 mg by mouth every 6 (six) hours as  needed for mild pain or headache.     . albuterol (VENTOLIN HFA) 108 (90 BASE) MCG/ACT inhaler Inhale 2 puffs into the lungs every 4 (four) hours as needed for wheezing or shortness of breath. 18 g 3  . ALPRAZolam (XANAX) 1 MG tablet Take 1 tablet (1 mg total) by mouth daily as needed for anxiety (for dialysis). 30 tablet 0  . aspirin EC 325 MG EC tablet Take 1 tablet (325 mg total) by mouth daily. 30 tablet 0  . baclofen (LIORESAL) 10 MG tablet Take 0.5-1 tablets (5-10 mg total) by mouth 2 (two) times daily as needed for muscle spasms. 30 each 0  . budesonide-formoterol (SYMBICORT) 160-4.5 MCG/ACT inhaler Inhale 2 puffs into the lungs 2 (two) times daily.    . calcitRIOL (ROCALTROL) 0.25 MCG capsule Take 0.25 mcg by mouth daily.    . calcium acetate (PHOSLO) 667 MG capsule Take 667-2,001 mg by mouth 3 (three) times daily with meals. Take 3 with meals and 1 with a snack    . citalopram (CELEXA) 20 MG tablet Take 1 tablet (20 mg total) by mouth daily. 90 tablet 3  . clopidogrel (PLAVIX) 75 MG tablet Take 1 tablet (75 mg total) by mouth daily with breakfast. 30 tablet 3  . cyanocobalamin (,VITAMIN B-12,) 1000 MCG/ML injection Inject 1,000 mcg into the muscle every 30 (thirty) days.    Marland Kitchen docusate sodium (COLACE) 100 MG capsule Take 1 capsule (100 mg total) by mouth 2 (two) times daily. (Patient taking differently: Take 100 mg by mouth 2 (two) times daily  as needed for mild constipation. ) 10 capsule 0  . donepezil (ARICEPT) 5 MG tablet TAKE ONE TABLET BY MOUTH AT BEDTIME 30 tablet 6  . furosemide (LASIX) 20 MG tablet Take 1 tablet (20 mg total) by mouth daily. 30 tablet 6  . guaiFENesin-codeine (CHERATUSSIN AC) 100-10 MG/5ML syrup Take 5 mLs by mouth daily as needed for cough (sedation precautions). 140 mL 0  . hydrOXYzine (ATARAX/VISTARIL) 25 MG tablet Take 0.5 tablets (12.5 mg total) by mouth at bedtime. 30 tablet 0  . lidocaine-prilocaine (EMLA) cream Apply 1 application topically as needed (topical  anesthesia for hemodialysis if Gebauers and Lidocaine injection are ineffective.). 30 g 0  . lovastatin (MEVACOR) 20 MG tablet TAKE ONE TABLET BY MOUTH AT BEDTIME 90 tablet 3  . metoprolol tartrate (LOPRESSOR) 25 MG tablet Take 0.5 tablets (12.5 mg total) by mouth 2 (two) times daily. 30 tablet 6  . midodrine (PROAMATINE) 5 MG tablet Take 1 tablet (5 mg total) by mouth every dialysis. Hold until you get to dialysis center    . Multiple Vitamin (MULTIVITAMIN WITH MINERALS) TABS tablet Take 1 tablet by mouth daily. 1 tablet 0  . pantoprazole (PROTONIX) 40 MG tablet Take 1 tablet (40 mg total) by mouth daily as needed (for heartburn/indigestion). 90 tablet 1  . promethazine (PHENERGAN) 12.5 MG tablet Take 12.5 mg by mouth every 6 (six) hours as needed for nausea or vomiting.    . traMADol (ULTRAM) 50 MG tablet Take 1 tablet (50 mg total) by mouth every 8 (eight) hours as needed.    . traZODone (DESYREL) 50 MG tablet Take 1 tablet (50 mg total) by mouth at bedtime. 30 tablet 6   No current facility-administered medications on file prior to visit.    Past Medical History  Diagnosis Date  . Osteoarthritis   . Depression   . Frequent headaches   . HTN (hypertension)   . HLD (hyperlipidemia)   . History of colon polyps 2013    colonoscopy ([name])  . 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 GERD (gastroesophageal reflux disease)   . COPD (chronic obstructive pulmonary disease) (HCC)   . ESRD (end stage renal disease) (HCC) 08/2011    a. on HD (TThSa), L forearm AV fistula, [name]  . Secondary hyperparathyroidism of renal origin (HCC)   . Osteopenia 01/2013  . Bronchiectasis (HCC) 08/2014     suggested by thoracic xray  . Anemia of chronic disease   . Severe aortic stenosis     a. echo 10/2014: EF 60-65%, no RWMA, GR1DD, mod to sev AS (peak vel 377 cm/s, mean gradient 34 mm Hg, peak gradient 57 mm Hg,  valve area (VTA) 0.72 cm^2  . Chronic respiratory failure (HCC)     a. 2/2 COPD; b. on 4-5L via nasal cannula  . Morbid obesity (HCC)   . GIB (gastrointestinal bleeding)     a. leading to cessation of warfarin 11/2014  . Chronic diastolic CHF (congestive heart failure) (HCC)     a. echo 10/2014: EF 60-65%, no RWMA, GR1DD, severe AS, mild MR, PASP 46 mm Hg  . Pulmonary embolism (HCC) 2009  . Renal failure     Patient has been on dialysis for " four years" -per patient.  Marland Kitchen Heart murmur   . 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 Cholecystectomy  2012  . Bunionectomy Left 2003  . Replacement total knee Left 2006  . Shoulder arthroscopy Right 2009  . Colonoscopy  08/2011    colon biopsies, [name]  . Esophagogastroduodenoscopy  08/2011    gastric cardia polyp  . Tonsillectomy  1955  . Tubal ligation  1980  . Exteriorization of a continuous ambulatory peritoneal dialysis catheter  01/2013    removal 12/2103 - [name]  . Hospitalization  12/2013    recurrent R pleural effusion due to peritoneal fluid translocation s/p rpt thoracentesis with 1.3 L fluid removed, ERSD started on HD this hospitalization  . US echocardiography  12/2013    EF 55-60%, nl LV sys fxn, mild-mod MR, AS, increased LV posterior wall thickness, mild TR  . Esophagogastroduodenoscopy Left 11/11/2014    Procedure: ESOPHAGOGASTRODUODENOSCOPY (EGD);  Surgeon: [name], MD;  Location: [facility] ENDOSCOPY;  Service: Endoscopy;  Laterality: Left;  . Peripheral vascular catheterization N/A 11/12/2014    Procedure: IVC Filter Insertion;  Surgeon: [name], MD;  Location: [facility] INVASIVE CV LAB;  Service: Cardiovascular;  Laterality: N/A;  . Cardiac catheterization N/A 01/18/2015    patent coronary arteries without significant osbtruction and preserved LV  function, severe aortic stenosis; Procedure: Right/Left Heart Cath and Coronary Angiography;  Surgeon: Tonny Bollman, MD  . Transcatheter aortic valve replacement, transfemoral Right 02/19/2015    Procedure: TRANSCATHETER AORTIC VALVE REPLACEMENT, TRANSFEMORAL;  Surgeon: Tonny Bollman, MD;  Location: [facility] OR;  Service: Open Heart Surgery;  Laterality: Right;  . Tee without cardioversion N/A 02/19/2015    Procedure: TRANSESOPHAGEAL ECHOCARDIOGRAM (TEE);  Surgeon: Tonny Bollman, MD;  Location: [facility] OR;  Service: Open Heart Surgery;  Laterality: N/A;    Social History  reports that she quit smoking about 8 years ago. Her smoking use included Cigarettes. She started smoking about 37 years ago. She has a 60 pack-year smoking history. She has never used smokeless tobacco. She reports that she does not drink alcohol or use illicit drugs.  Family History family history includes Cancer in her father; Cancer (age of onset: 74) in her daughter; Cancer (age of onset: 18) in her mother; Deep vein thrombosis in her brother; Dementia (age of onset: 29) in her father; Hypertension in her brother and father; Stroke in her mother. There is no history of Diabetes or CAD.   Review of Systems  Constitutional: Positive for fatigue.  Respiratory: Positive for shortness of breath.   Cardiovascular: Negative.   Gastrointestinal: Negative.   Musculoskeletal: Negative.   Neurological: Negative.   Hematological: Negative.   Psychiatric/Behavioral: Negative.     BP 120/68 mmHg  Pulse 79  Ht  (1.626 m)  Wt 208 lb (94.348 kg)  BMI 35.69 kg/m2  Physical Exam  Constitutional: She is oriented to person, place, and time. She appears well-developed and well-nourished.  Presenting in a wheelchair on oxygen  HENT:  Head: Normocephalic.  Nose: Nose normal.  Mouth/Throat: Oropharynx is clear and moist.  Eyes: Conjunctivae are normal. Pupils are equal, round, and reactive to light.  Neck: Normal range of motion.  Neck supple. No JVD present.  Cardiovascular: Normal rate, regular rhythm and intact distal pulses.  Exam reveals no gallop and no friction rub.   Murmur heard.  Systolic murmur is present with a grade of 2/6  Pulmonary/Chest: Effort normal. No respiratory distress. She has decreased breath sounds. She  has no wheezes. She has no rales. She exhibits no tenderness.  Abdominal: Soft. Bowel sounds are normal. She exhibits no distension. There is no tenderness.  Musculoskeletal: Normal range of motion. She exhibits no edema or tenderness.  Lymphadenopathy:    She has no cervical adenopathy.  Neurological: She is alert and oriented to person, place, and time. Coordination normal.  Skin: Skin is warm and dry. No rash noted. No erythema.  Psychiatric: She has a normal mood and affect. Her behavior is normal. Judgment and thought content normal.

## 2015-07-03 NOTE — Assessment & Plan Note (Signed)
Appears relatively euvolemic. Recent echocardiogram showing normal right heart pressures No edema on exam today Compliant with her dialysis, reaching her dry weight, takes Lasix daily

## 2015-07-03 NOTE — Assessment & Plan Note (Signed)
Hospital records reviewed Successful placement of prosthetic valve Currently on aspirin and Plavix. Consider decreasing aspirin dose at 6 months if not a warfarin candidate   Total encounter time more than 25 minutes  Greater than 50% was spent in counseling and coordination of care with the patient

## 2015-07-03 NOTE — Assessment & Plan Note (Signed)
Recommended compression hose Leg movement to avoid repeat DVTs May not be a good candidate for warfarin. She will discuss with nephrology

## 2015-07-03 NOTE — Assessment & Plan Note (Signed)
S/p TAVR, recovered well She has noticed improvement in her symptoms, less hospital admissions for CHF and shortness of breath

## 2015-07-08 ENCOUNTER — Other Ambulatory Visit: Payer: Self-pay | Admitting: Family Medicine

## 2015-07-08 DIAGNOSIS — E559 Vitamin D deficiency, unspecified: Secondary | ICD-10-CM

## 2015-07-08 DIAGNOSIS — N186 End stage renal disease: Secondary | ICD-10-CM

## 2015-07-08 DIAGNOSIS — Z1159 Encounter for screening for other viral diseases: Secondary | ICD-10-CM

## 2015-07-08 DIAGNOSIS — E538 Deficiency of other specified B group vitamins: Secondary | ICD-10-CM

## 2015-07-08 DIAGNOSIS — Z992 Dependence on renal dialysis: Secondary | ICD-10-CM

## 2015-07-08 DIAGNOSIS — E785 Hyperlipidemia, unspecified: Secondary | ICD-10-CM

## 2015-07-09 NOTE — Telephone Encounter (Signed)
Px written for call in   

## 2015-07-09 NOTE — Telephone Encounter (Signed)
Rx called in as directed.   

## 2015-07-09 NOTE — Telephone Encounter (Signed)
Ok to refill in Dr. Timoteo Expose absence? Last filled 05/15/15 #[id number]

## 2015-07-10 ENCOUNTER — Telehealth: Payer: Self-pay | Admitting: Radiology

## 2015-07-10 ENCOUNTER — Other Ambulatory Visit: Payer: Self-pay | Admitting: Family Medicine

## 2015-07-10 ENCOUNTER — Ambulatory Visit (INDEPENDENT_AMBULATORY_CARE_PROVIDER_SITE_OTHER): Payer: Medicare Other

## 2015-07-10 ENCOUNTER — Other Ambulatory Visit (INDEPENDENT_AMBULATORY_CARE_PROVIDER_SITE_OTHER): Payer: Medicare Other

## 2015-07-10 VITALS — BP 130/78 | HR 80 | Temp 98.2°F | Ht 64.0 in | Wt 208.0 lb

## 2015-07-10 DIAGNOSIS — N186 End stage renal disease: Secondary | ICD-10-CM

## 2015-07-10 DIAGNOSIS — E785 Hyperlipidemia, unspecified: Secondary | ICD-10-CM

## 2015-07-10 DIAGNOSIS — Z0000 Encounter for general adult medical examination without abnormal findings: Secondary | ICD-10-CM

## 2015-07-10 DIAGNOSIS — E538 Deficiency of other specified B group vitamins: Secondary | ICD-10-CM

## 2015-07-10 DIAGNOSIS — E559 Vitamin D deficiency, unspecified: Secondary | ICD-10-CM

## 2015-07-10 DIAGNOSIS — Z992 Dependence on renal dialysis: Secondary | ICD-10-CM

## 2015-07-10 DIAGNOSIS — Z23 Encounter for immunization: Secondary | ICD-10-CM

## 2015-07-10 DIAGNOSIS — Z1159 Encounter for screening for other viral diseases: Secondary | ICD-10-CM

## 2015-07-10 LAB — CBC WITH DIFFERENTIAL/PLATELET
Basophils Absolute: 0 10*3/uL (ref 0.0–0.1)
Basophils Relative: 0.5 % (ref 0.0–3.0)
EOS ABS: 0.2 10*3/uL (ref 0.0–0.7)
EOS PCT: 3.4 % (ref 0.0–5.0)
HCT: 30.6 % — ABNORMAL LOW (ref 36.0–46.0)
Hemoglobin: 10 g/dL — ABNORMAL LOW (ref 12.0–15.0)
Lymphocytes Relative: 14.8 % (ref 12.0–46.0)
Lymphs Abs: 0.8 10*3/uL (ref 0.7–4.0)
MCHC: 32.6 g/dL (ref 30.0–36.0)
MCV: 99.1 fl (ref 78.0–100.0)
MONO ABS: 0.5 10*3/uL (ref 0.1–1.0)
Monocytes Relative: 9.7 % (ref 3.0–12.0)
Neutro Abs: 3.8 10*3/uL (ref 1.4–7.7)
Neutrophils Relative %: 71.6 % (ref 43.0–77.0)
Platelets: 179 10*3/uL (ref 150.0–400.0)
RBC: 3.09 Mil/uL — AB (ref 3.87–5.11)
RDW: 16.9 % — ABNORMAL HIGH (ref 11.5–15.5)
WBC: 5.3 10*3/uL (ref 4.0–10.5)

## 2015-07-10 LAB — COMPREHENSIVE METABOLIC PANEL
ALBUMIN: 3.8 g/dL (ref 3.5–5.2)
ALK PHOS: 73 U/L (ref 39–117)
ALT: 8 U/L (ref 0–35)
AST: 13 U/L (ref 0–37)
BUN: 25 mg/dL — AB (ref 6–23)
CO2: 31 mEq/L (ref 19–32)
CREATININE: 5.53 mg/dL — AB (ref 0.40–1.20)
Calcium: 9.7 mg/dL (ref 8.4–10.5)
Chloride: 100 mEq/L (ref 96–112)
GFR: 8.11 mL/min — CL (ref >60.00)
GLUCOSE: 100 mg/dL — AB (ref 70–99)
Potassium: 4.7 mEq/L (ref 3.5–5.1)
Sodium: 141 mEq/L (ref 135–145)
TOTAL PROTEIN: 6.8 g/dL (ref 6.0–8.3)
Total Bilirubin: 0.3 mg/dL (ref 0.2–1.2)

## 2015-07-10 LAB — VITAMIN B12

## 2015-07-10 LAB — LIPID PANEL
CHOLESTEROL: 149 mg/dL (ref 0–200)
HDL: 34.3 mg/dL — ABNORMAL LOW (ref >39.00)
LDL Cholesterol: 91 mg/dL (ref 0–99)
NONHDL: 114.28
Total CHOL/HDL Ratio: 4
Triglycerides: 116 mg/dL (ref 0.0–149.0)
VLDL: 23.2 mg/dL (ref 0.0–40.0)

## 2015-07-10 LAB — VITAMIN D 25 HYDROXY (VIT D DEFICIENCY, FRACTURES): VITD: 19.64 ng/mL — AB (ref 30.00–100.00)

## 2015-07-10 MED ORDER — CYANOCOBALAMIN 1000 MCG/ML IJ SOLN
1000.0000 ug | Freq: Once | INTRAMUSCULAR | Status: AC
  Administered 2015-07-10: 1000 ug via INTRAMUSCULAR

## 2015-07-10 NOTE — Progress Notes (Signed)
Pre visit review using our clinic review tool, if applicable. No additional management support is needed unless otherwise documented below in the visit note. 

## 2015-07-10 NOTE — Progress Notes (Signed)
Subjective:   Ariel Smith is a 70 y.o. female who presents for Medicare Annual (Subsequent) preventive examination.  Cardiac Risk Factors include: advanced age (>57men, >1 women);obesity (BMI >30kg/m2);hypertension;sedentary lifestyle     Objective:     Vitals: BP 130/78 mmHg  Pulse 80  Temp(Src) 98.2 F (36.8 C) (Oral)  Ht 5\' 4"  (1.626 m)  Wt 208 lb (94.348 kg)  BMI 35.69 kg/m2  SpO2 99%  Body mass index is 35.69 kg/(m^2).   Tobacco History  Smoking status  . Former Smoker -- 2.00 packs/day for 30 years  . Types: Cigarettes  . Start date: 04/20/1978  . Quit date: 04/21/2007  Smokeless tobacco  . Never Used     Counseling given: No   Past Medical History  Diagnosis Date  . Osteoarthritis   . Depression   . Frequent headaches   . HTN (hypertension)   . HLD (hyperlipidemia)   . History of colon polyps 2013    colonoscopy ([name])  . 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 GERD (gastroesophageal reflux disease)   . COPD (chronic obstructive pulmonary disease) (HCC)   . ESRD (end stage renal disease) (HCC) 08/2011    a. on HD (TThSa), L forearm AV fistula, [name]  . Secondary hyperparathyroidism of renal origin (HCC)   . Osteopenia 01/2013  . Bronchiectasis (HCC) 08/2014     suggested by thoracic xray  . Anemia of chronic disease   . Severe aortic stenosis     a. echo 10/2014: EF 60-65%, no RWMA, GR1DD, mod to sev AS (peak vel 377 cm/s, mean gradient 34 mm Hg, peak gradient 57 mm Hg, valve area (VTA) 0.72 cm^2  . Chronic respiratory failure (HCC)     a. 2/2 COPD; b. on 4-5L via nasal cannula  . Morbid obesity (HCC)   . GIB (gastrointestinal bleeding)     a. leading to cessation of warfarin 11/2014  . Chronic diastolic CHF (congestive heart failure) (HCC)     a. echo 10/2014: EF 60-65%, no RWMA, GR1DD, severe AS, mild MR, PASP 46 mm Hg  . Pulmonary embolism (HCC)  2009  . Renal failure     Patient has been on dialysis for " four years" -per patient.  Marland Kitchen Heart murmur   . 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 Cholecystectomy  2012  . Bunionectomy Left 2003  . Replacement total knee Left 2006  . Shoulder arthroscopy Right 2009  . Colonoscopy  08/2011    colon biopsies, [name]  . Esophagogastroduodenoscopy  08/2011    gastric cardia polyp  . Tonsillectomy  1955  . Tubal ligation  1980  . Exteriorization of a continuous ambulatory peritoneal dialysis catheter  01/2013    removal 12/2103 - [name]  . Hospitalization  12/2013    recurrent R pleural effusion due to peritoneal fluid translocation s/p rpt thoracentesis with 1.3 L fluid removed, ERSD started on HD this hospitalization  . US echocardiography  12/2013    EF 55-60%, nl LV sys fxn, mild-mod MR, AS, increased LV posterior wall thickness, mild TR  . Esophagogastroduodenoscopy Left 11/11/2014    Procedure: ESOPHAGOGASTRODUODENOSCOPY (EGD);  Surgeon: [name], MD;  Location: [facility] ENDOSCOPY;  Service:  Endoscopy;  Laterality: Left;  . Peripheral vascular catheterization N/A 11/12/2014    Procedure: IVC Filter Insertion;  Surgeon: [name], MD;  Location: [facility] INVASIVE CV LAB;  Service: Cardiovascular;  Laterality: N/A;  . Cardiac catheterization N/A 01/18/2015    patent coronary arteries without significant osbtruction and preserved LV function, severe aortic stenosis; Procedure: Right/Left Heart Cath and Coronary Angiography;  Surgeon: Tonny Bollman, MD  . Transcatheter aortic valve replacement, transfemoral Right 02/19/2015    Procedure: TRANSCATHETER AORTIC VALVE REPLACEMENT, TRANSFEMORAL;  Surgeon: Tonny Bollman, MD;  Location: [facility] OR;  Service: Open Heart Surgery;  Laterality: Right;  . Tee without cardioversion  N/A 02/19/2015    Procedure: TRANSESOPHAGEAL ECHOCARDIOGRAM (TEE);  Surgeon: Tonny Bollman, MD;  Location: [facility] OR;  Service: Open Heart Surgery;  Laterality: N/A;   Family History  Problem Relation Age of Onset  . Deep vein thrombosis Brother   . Cancer Mother 13    colon  . Stroke Mother   . Cancer Father     prostate  . Hypertension Father   . Dementia Father 76  . Hypertension Brother   . Diabetes Neg Hx   . CAD Neg Hx   . Cancer Daughter 24    breast   History  Sexual Activity  . Sexual Activity: No    Outpatient Encounter Prescriptions as of 07/10/2015  Medication Sig  . acetaminophen (TYLENOL) 500 MG tablet Take 1,000 mg by mouth every 6 (six) hours as needed for mild pain or headache.   . albuterol (VENTOLIN HFA) 108 (90 BASE) MCG/ACT inhaler Inhale 2 puffs into the lungs every 4 (four) hours as needed for wheezing or shortness of breath.  . ALPRAZolam (XANAX) 1 MG tablet TAKE ONE TABLET BY MOUTH ONCE DAILY AS NEEDED FOR ANXIETY (FOR DIALYSIS)  . aspirin EC 325 MG EC tablet Take 1 tablet (325 mg total) by mouth daily.  . baclofen (LIORESAL) 10 MG tablet Take 0.5-1 tablets (5-10 mg total) by mouth 2 (two) times daily as needed for muscle spasms.  . budesonide-formoterol (SYMBICORT) 160-4.5 MCG/ACT inhaler Inhale 2 puffs into the lungs 2 (two) times daily.  . calcitRIOL (ROCALTROL) 0.25 MCG capsule Take 0.25 mcg by mouth daily.  . calcium acetate (PHOSLO) 667 MG capsule Take 667-2,001 mg by mouth 3 (three) times daily with meals. Take 3 with meals and 1 with a snack  . citalopram (CELEXA) 20 MG tablet Take 1 tablet (20 mg total) by mouth daily.  . clopidogrel (PLAVIX) 75 MG tablet Take 1 tablet (75 mg total) by mouth daily with breakfast.  . cyanocobalamin (,VITAMIN B-12,) 1000 MCG/ML injection Inject 1,000 mcg into the muscle every 30 (thirty) days.  Marland Kitchen docusate sodium (COLACE) 100 MG capsule Take 1 capsule (100 mg total) by mouth 2 (two) times daily. (Patient taking  differently: Take 100 mg by mouth 2 (two) times daily as needed for mild constipation. )  . donepezil (ARICEPT) 5 MG tablet TAKE ONE TABLET BY MOUTH AT BEDTIME  . furosemide (LASIX) 20 MG tablet Take 1 tablet (20 mg total) by mouth daily.  Marland Kitchen guaiFENesin-codeine (CHERATUSSIN AC) 100-10 MG/5ML syrup Take 5 mLs by mouth daily as needed for cough (sedation precautions).  . hydrOXYzine (ATARAX/VISTARIL) 25 MG tablet Take 0.5 tablets (12.5 mg total) by mouth at bedtime.  . lidocaine-prilocaine (EMLA) cream Apply 1 application topically as needed (topical anesthesia for hemodialysis if Gebauers and Lidocaine injection are ineffective.).  Marland Kitchen lovastatin (MEVACOR) 20 MG tablet TAKE ONE TABLET BY MOUTH AT  BEDTIME  . metoprolol tartrate (LOPRESSOR) 25 MG tablet Take 0.5 tablets (12.5 mg total) by mouth 2 (two) times daily.  . midodrine (PROAMATINE) 5 MG tablet Take 1 tablet (5 mg total) by mouth every dialysis. Hold until you get to dialysis center  . Multiple Vitamin (MULTIVITAMIN WITH MINERALS) TABS tablet Take 1 tablet by mouth daily.  . pantoprazole (PROTONIX) 40 MG tablet Take 1 tablet (40 mg total) by mouth daily as needed (for heartburn/indigestion).  . promethazine (PHENERGAN) 12.5 MG tablet Take 12.5 mg by mouth every 6 (six) hours as needed for nausea or vomiting.  . traMADol (ULTRAM) 50 MG tablet Take 1 tablet (50 mg total) by mouth every 8 (eight) hours as needed.  . traZODone (DESYREL) 50 MG tablet Take 1 tablet (50 mg total) by mouth at bedtime.   No facility-administered encounter medications on file as of 07/10/2015.    Activities of Daily Living In your present state of health, do you have any difficulty performing the following activities: 07/10/2015 02/20/2015  Hearing? Malvin Johns  Vision? Y N  Difficulty concentrating or making decisions? Y N  Walking or climbing stairs? Y Y  Dressing or bathing? N N  Doing errands, shopping? Malvin Johns  Preparing Food and eating ? N -  Using the Toilet? N -  In  the past six months, have you accidently leaked urine? Y -  Do you have problems with loss of bowel control? N -  Managing your Medications? Y -  Managing your Finances? N -  Housekeeping or managing your Housekeeping? Y -    Patient Care Team: [name], MD as PCP - General (Family Medicine) Mosetta Pigeon, MD as Consulting Physician (Internal Medicine) Delma Freeze, FNP as Nurse Practitioner (Cardiology) Antonieta Iba, MD as Consulting Physician (Cardiology)    Assessment:     Hearing Screening   125Hz  250Hz  500Hz  1000Hz  2000Hz  4000Hz  8000Hz   Right ear:   0 0 40 0   Left ear:   0 0 40 0   Vision Screening Comments: Last eye exam approx. 1 yr ago with optometrist @ [facility]   Exercise Activities and Dietary recommendations Current Exercise Habits: The patient does not participate in regular exercise at present, Exercise limited by: respiratory conditions(s);psychological condition(s)  Goals    . Increase physical activity     Starting 07/10/2015, I will begin doing upper body exercises for 5-10 minutes every other day.       Fall Risk Fall Risk  07/10/2015 01/16/2015 03/30/2014 01/27/2013  Falls in the past year? Yes No No No  Number falls in past yr: 1 - - -  Injury with Fall? No - - -  Risk for fall due to : History of fall(s) Impaired balance/gait - -  Follow up Falls evaluation completed - - -   Depression Screen PHQ 2/9 Scores 07/10/2015 01/16/2015 11/01/2014 03/30/2014  PHQ - 2 Score 4 2 5 3   PHQ- 9 Score 5 8 19 13      Cognitive Testing MMSE - Mini Mental State Exam 07/10/2015  Orientation to time 5  Orientation to Place 5  Registration 3  Attention/ Calculation 5  Recall 3  Language- name 2 objects 0 - didn't ask  Language- repeat 1  Language- follow 3 step command 3  Language- read & follow direction 1  Write a sentence 0 - didn't ask  Copy design 0 - didn't ask  Total score 26    Immunization History  Administered Date(s) Administered  .  Hepatitis B 11/06/2011, 12/04/2011, 01/04/2012, 05/26/2012  . Influenza,inj,Quad PF,36+ Mos 12/29/2014  . Influenza-Unspecified 01/18/2014  . Pneumococcal Conjugate-13 07/10/2015  . Pneumococcal Polysaccharide-23 02/18/2012   Screening Tests Health Maintenance  Topic Date Due  . ZOSTAVAX  07/18/2016 (Originally 04/23/2006) - Engineer, manufacturing systems  . TETANUS/TDAP  07/09/2025 (Originally 04/23/1965) - Engineer, manufacturing systems  . INFLUENZA VACCINE  11/19/2015  . MAMMOGRAM  10/07/2016  . COLONOSCOPY  08/18/2021  . DTaP/Tdap/Td  Completed  . DEXA SCAN  Completed  . Hepatitis C Screening  Completed  . PNA vac Low Risk Adult  Completed      Plan:     I have personally reviewed the Medicare Annual Wellness questionnaire and have noted the following in the patient's chart:  A. Medical and social history B. Use of alcohol, tobacco or illicit drugs  C. Current medications and supplements D. Functional ability and status E.  Nutritional status F.  Physical activity G. Advance directives H. List of other physicians I.  Hospitalizations, surgeries, and ER visits in previous 12 months J.  Vitals K. Screenings to include hearing, vision, cognitive, depression L. Referrals and appointments - none  In addition, I reviewed preventive protocols, quality metrics, and best practice recommendations specific to patient. A written personalized care plan for preventive services as well as general preventive health recommendations were provided to patient.  See attached scanned questionnaire for additional information.   Signed,   Randa Evens, MHA, BS, LPN Health Advisor 07/10/2015

## 2015-07-10 NOTE — Telephone Encounter (Signed)
[facility] lab called critical results, CRT - 5.53, GFR - 8.11. Results given to Dr Para March

## 2015-07-10 NOTE — Patient Instructions (Signed)
Ariel Smith , Thank you for taking time to come for your Medicare Wellness Visit. I appreciate your ongoing commitment to your health goals. Please review the following plan we discussed and let me know if I can assist you in the future.   These are the goals we discussed: Goals    . Increase physical activity     Starting 07/10/2015, I will begin doing upper body exercises for 5-10 minutes every other day.        This is a list of the screening recommended for you and due dates:  Health Maintenance  Topic Date Due  .  Hepatitis C: One time screening is recommended by Center for Disease Control  (CDC) for  adults born from [birth year] through 1965.   Completed  . Tetanus Tree surgeon  . Shingles Tree surgeon  . Pneumonia vaccines (2 of 2 - PCV13) Completed  . Flu Shot  11/19/2015  . Mammogram  10/07/2016  . Colon Cancer Screening  08/18/2021  . DTaP/Tdap/Td vaccine  Completed  . DEXA scan (bone density measurement)  Completed   Preventive Care for Adults  A healthy lifestyle and preventive care can promote health and wellness. Preventive health guidelines for adults include the following key practices.  . A routine yearly physical is a good way to check with your health care provider about your health and preventive screening. It is a chance to share any concerns and updates on your health and to receive a thorough exam.  . Visit your dentist for a routine exam and preventive care every 6 months. Brush your teeth twice a day and floss once a day. Good oral hygiene prevents tooth decay and gum disease.  . The frequency of eye exams is based on your age, health, family medical history, use  of contact lenses, and other factors. Follow your health care provider's ecommendations for frequency of eye exams.  . Eat a healthy diet. Foods like vegetables, fruits, whole grains, low-fat dairy products, and lean protein foods contain the nutrients you need without  too many calories. Decrease your intake of foods high in solid fats, added sugars, and salt. Eat the right amount of calories for you. Get information about a proper diet from your health care provider, if necessary.  . Regular physical exercise is one of the most important things you can do for your health. Most adults should get at least 150 minutes of moderate-intensity exercise (any activity that increases your heart rate and causes you to sweat) each week. In addition, most adults need muscle-strengthening exercises on 2 or more days a week.  Silver Sneakers may be a benefit available to you. To determine eligibility, you may visit the website: www.silversneakers.com or contact program at 325-380-6744 Mon-Fri between 8AM-8PM.   . Maintain a healthy weight. The body mass index (BMI) is a screening tool to identify possible weight problems. It provides an estimate of body fat based on height and weight. Your health care provider can find your BMI and can help you achieve or maintain a healthy weight.   For adults 20 years and older: ? A BMI below 18.5 is considered underweight. ? A BMI of 18.5 to 24.9 is normal. ? A BMI of 25 to 29.9 is considered overweight. ? A BMI of 30 and above is considered obese.   . Maintain normal blood lipids and cholesterol levels by exercising and minimizing your intake of saturated fat. Eat a balanced diet with plenty of fruit  and vegetables. Blood tests for lipids and cholesterol should begin at age 42 and be repeated every 5 years. If your lipid or cholesterol levels are high, you are over 50, or you are at high risk for heart disease, you may need your cholesterol levels checked more frequently. Ongoing high lipid and cholesterol levels should be treated with medicines if diet and exercise are not working.  . If you smoke, find out from your health care provider how to quit. If you do not use tobacco, please do not start.  . If you choose to drink alcohol,  please do not consume more than 2 drinks per day. One drink is considered to be 12 ounces (355 mL) of beer, 5 ounces (148 mL) of wine, or 1.5 ounces (44 mL) of liquor.  . If you are 78-15 years old, ask your health care provider if you should take aspirin to prevent strokes.  . Use sunscreen. Apply sunscreen liberally and repeatedly throughout the day. You should seek shade when your shadow is shorter than you. Protect yourself by wearing long sleeves, pants, a wide-brimmed hat, and sunglasses year round, whenever you are outdoors.  . Once a month, do a whole body skin exam, using a mirror to look at the skin on your back. Tell your health care provider of new moles, moles that have irregular borders, moles that are larger than a pencil eraser, or moles that have changed in shape or color.

## 2015-07-10 NOTE — Progress Notes (Signed)
   Subjective:    Patient ID: Ariel Smith, female    DOB: [birth year], 70 y.o.   MRN: [id number]  HPI  I reviewed health advisor's note, was available for consultation, and agree with documentation and plan.   Review of Systems     Objective:   Physical Exam        Assessment & Plan:

## 2015-07-10 NOTE — Telephone Encounter (Signed)
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  Thanks.

## 2015-07-11 ENCOUNTER — Other Ambulatory Visit: Payer: Medicare Other

## 2015-07-11 LAB — HEPATITIS C ANTIBODY: HCV Ab: NEGATIVE

## 2015-07-18 ENCOUNTER — Ambulatory Visit (INDEPENDENT_AMBULATORY_CARE_PROVIDER_SITE_OTHER): Payer: Medicare Other | Admitting: Family Medicine

## 2015-07-18 ENCOUNTER — Encounter: Payer: Self-pay | Admitting: Family Medicine

## 2015-07-18 VITALS — BP 130/78 | HR 73 | Temp 97.5°F | Wt 206.8 lb

## 2015-07-18 DIAGNOSIS — R053 Chronic cough: Secondary | ICD-10-CM

## 2015-07-18 DIAGNOSIS — Z992 Dependence on renal dialysis: Secondary | ICD-10-CM

## 2015-07-18 DIAGNOSIS — F331 Major depressive disorder, recurrent, moderate: Secondary | ICD-10-CM

## 2015-07-18 DIAGNOSIS — Z0000 Encounter for general adult medical examination without abnormal findings: Secondary | ICD-10-CM | POA: Diagnosis not present

## 2015-07-18 DIAGNOSIS — N186 End stage renal disease: Secondary | ICD-10-CM | POA: Diagnosis not present

## 2015-07-18 DIAGNOSIS — E538 Deficiency of other specified B group vitamins: Secondary | ICD-10-CM

## 2015-07-18 DIAGNOSIS — I10 Essential (primary) hypertension: Secondary | ICD-10-CM

## 2015-07-18 DIAGNOSIS — G4700 Insomnia, unspecified: Secondary | ICD-10-CM

## 2015-07-18 DIAGNOSIS — E785 Hyperlipidemia, unspecified: Secondary | ICD-10-CM | POA: Diagnosis not present

## 2015-07-18 DIAGNOSIS — E559 Vitamin D deficiency, unspecified: Secondary | ICD-10-CM

## 2015-07-18 DIAGNOSIS — K219 Gastro-esophageal reflux disease without esophagitis: Secondary | ICD-10-CM

## 2015-07-18 DIAGNOSIS — R05 Cough: Secondary | ICD-10-CM

## 2015-07-18 DIAGNOSIS — Z7189 Other specified counseling: Secondary | ICD-10-CM | POA: Diagnosis not present

## 2015-07-18 DIAGNOSIS — D638 Anemia in other chronic diseases classified elsewhere: Secondary | ICD-10-CM

## 2015-07-18 DIAGNOSIS — IMO0001 Reserved for inherently not codable concepts without codable children: Secondary | ICD-10-CM

## 2015-07-18 DIAGNOSIS — M8580 Other specified disorders of bone density and structure, unspecified site: Secondary | ICD-10-CM

## 2015-07-18 NOTE — Progress Notes (Signed)
Pre visit review using our clinic review tool, if applicable. No additional management support is needed unless otherwise documented below in the visit note. 

## 2015-07-18 NOTE — Assessment & Plan Note (Signed)
Chronic, stable. Continue lovastatin.  

## 2015-07-18 NOTE — Assessment & Plan Note (Signed)
Largely resolved with dialysis. Takes lasix 20mg  daily. midodrin PRN hypotension after dialysis.

## 2015-07-18 NOTE — Assessment & Plan Note (Signed)
Advanced directives - older daughter is HCPOA. Will bring [place] copy which daughter has at home.

## 2015-07-18 NOTE — Assessment & Plan Note (Signed)
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

## 2015-07-18 NOTE — Patient Instructions (Addendum)
Stop trazodone. Ok to try xanax 1/2-1 tablet nightly for sleep as needed, as well as prior to dialysis sessions. I will check on b12 shots vs oral and get in touch with you. Bring [place] a copy of your living will to update your chart.  Start claritin '10mg'$  daily for possible allergic contribution to cough. Good to see you today. Return in 3 months for follow up visit  Health Maintenance, Female Adopting a healthy lifestyle and getting preventive care can go a long way to promote health and wellness. Talk with your health care provider about what schedule of regular examinations is right for you. This is a good chance for you to check in with your provider about disease prevention and staying healthy. In between checkups, there are plenty of things you can do on your own. Experts have done a lot of research about which lifestyle changes and preventive measures are most likely to keep you healthy. Ask your health care provider for more information. WEIGHT AND DIET  Eat a healthy diet  Be sure to include plenty of vegetables, fruits, low-fat dairy products, and lean protein.  Do not eat a lot of foods high in solid fats, added sugars, or salt.  Get regular exercise. This is one of the most important things you can do for your health.  Most adults should exercise for at least 150 minutes each week. The exercise should increase your heart rate and make you sweat (moderate-intensity exercise).  Most adults should also do strengthening exercises at least twice a week. This is in addition to the moderate-intensity exercise.  Maintain a healthy weight  Body mass index (BMI) is a measurement that can be used to identify possible weight problems. It estimates body fat based on height and weight. Your health care provider can help determine your BMI and help you achieve or maintain a healthy weight.  For females 43 years of age and older:   A BMI below 18.5 is considered underweight.  A BMI of 18.5 to  24.9 is normal.  A BMI of 25 to 29.9 is considered overweight.  A BMI of 30 and above is considered obese.  Watch levels of cholesterol and blood lipids  You should start having your blood tested for lipids and cholesterol at 70 years of age, then have this test every 5 years.  You may need to have your cholesterol levels checked more often if:  Your lipid or cholesterol levels are high.  You are older than 69 years of age.  You are at high risk for heart disease.  CANCER SCREENING   Lung Cancer  Lung cancer screening is recommended for adults 55-41 years old who are at high risk for lung cancer because of a history of smoking.  A yearly low-dose CT scan of the lungs is recommended for people who:  Currently smoke.  Have quit within the past 15 years.  Have at least a 30-pack-year history of smoking. A pack year is smoking an average of one pack of cigarettes a day for 1 year.  Yearly screening should continue until it has been 15 years since you quit.  Yearly screening should stop if you develop a health problem that would prevent you from having lung cancer treatment.  Breast Cancer  Practice breast self-awareness. This means understanding how your breasts normally appear and feel.  It also means doing regular breast self-exams. Let your health care provider know about any changes, no matter how small.  If you are  in your 34s or 30s, you should have a clinical breast exam (CBE) by a health care provider every 1-3 years as part of a regular health exam.  If you are 54 or older, have a CBE every year. Also consider having a breast X-ray (mammogram) every year.  If you have a family history of breast cancer, talk to your health care provider about genetic screening.  If you are at high risk for breast cancer, talk to your health care provider about having an MRI and a mammogram every year.  Breast cancer gene (BRCA) assessment is recommended for women who have family  members with BRCA-related cancers. BRCA-related cancers include:  Breast.  Ovarian.  Tubal.  Peritoneal cancers.  Results of the assessment will determine the need for genetic counseling and BRCA1 and BRCA2 testing. Cervical Cancer Your health care provider may recommend that you be screened regularly for cancer of the pelvic organs (ovaries, uterus, and vagina). This screening involves a pelvic examination, including checking for microscopic changes to the surface of your cervix (Pap test). You may be encouraged to have this screening done every 3 years, beginning at age 32.  For women ages 71-65, health care providers may recommend pelvic exams and Pap testing every 3 years, or they may recommend the Pap and pelvic exam, combined with testing for human papilloma virus (HPV), every 5 years. Some types of HPV increase your risk of cervical cancer. Testing for HPV may also be done on women of any age with unclear Pap test results.  Other health care providers may not recommend any screening for nonpregnant women who are considered low risk for pelvic cancer and who do not have symptoms. Ask your health care provider if a screening pelvic exam is right for you.  If you have had past treatment for cervical cancer or a condition that could lead to cancer, you need Pap tests and screening for cancer for at least 20 years after your treatment. If Pap tests have been discontinued, your risk factors (such as having a new sexual partner) need to be reassessed to determine if screening should resume. Some women have medical problems that increase the chance of getting cervical cancer. In these cases, your health care provider may recommend more frequent screening and Pap tests. Colorectal Cancer  This type of cancer can be detected and often prevented.  Routine colorectal cancer screening usually begins at 69 years of age and continues through 70 years of age.  Your health care provider may recommend  screening at an earlier age if you have risk factors for colon cancer.  Your health care provider may also recommend using home test kits to check for hidden blood in the stool.  A small camera at the end of a tube can be used to examine your colon directly (sigmoidoscopy or colonoscopy). This is done to check for the earliest forms of colorectal cancer.  Routine screening usually begins at age 44.  Direct examination of the colon should be repeated every 5-10 years through 69 years of age. However, you may need to be screened more often if early forms of precancerous polyps or small growths are found. Skin Cancer  Check your skin from head to toe regularly.  Tell your health care provider about any new moles or changes in moles, especially if there is a change in a mole's shape or color.  Also tell your health care provider if you have a mole that is larger than the size of a  pencil eraser.  Always use sunscreen. Apply sunscreen liberally and repeatedly throughout the day.  Protect yourself by wearing long sleeves, pants, a wide-brimmed hat, and sunglasses whenever you are outside. HEART DISEASE, DIABETES, AND HIGH BLOOD PRESSURE   High blood pressure causes heart disease and increases the risk of stroke. High blood pressure is more likely to develop in:  People who have blood pressure in the high end of the normal range (130-139/85-89 mm Hg).  People who are overweight or obese.  People who are African American.  If you are 33-56 years of age, have your blood pressure checked every 3-5 years. If you are 62 years of age or older, have your blood pressure checked every year. You should have your blood pressure measured twice--once when you are at a hospital or clinic, and once when you are not at a hospital or clinic. Record the average of the two measurements. To check your blood pressure when you are not at a hospital or clinic, you can use:  An automated blood pressure machine at a  pharmacy.  A home blood pressure monitor.  If you are between 83 years and 22 years old, ask your health care provider if you should take aspirin to prevent strokes.  Have regular diabetes screenings. This involves taking a blood sample to check your fasting blood sugar level.  If you are at a normal weight and have a low risk for diabetes, have this test once every three years after 69 years of age.  If you are overweight and have a high risk for diabetes, consider being tested at a younger age or more often. PREVENTING INFECTION  Hepatitis B  If you have a higher risk for hepatitis B, you should be screened for this virus. You are considered at high risk for hepatitis B if:  You were born in a country where hepatitis B is common. Ask your health care provider which countries are considered high risk.  Your parents were born in a high-risk country, and you have not been immunized against hepatitis B (hepatitis B vaccine).  You have HIV or AIDS.  You use needles to inject street drugs.  You live with someone who has hepatitis B.  You have had sex with someone who has hepatitis B.  You get hemodialysis treatment.  You take certain medicines for conditions, including cancer, organ transplantation, and autoimmune conditions. Hepatitis C  Blood testing is recommended for:  Everyone born from [birth year] through 1964.  Anyone with known risk factors for hepatitis C. Sexually transmitted infections (STIs)  You should be screened for sexually transmitted infections (STIs) including gonorrhea and chlamydia if:  You are sexually active and are younger than 69 years of age.  You are older than 69 years of age and your health care provider tells you that you are at risk for this type of infection.  Your sexual activity has changed since you were last screened and you are at an increased risk for chlamydia or gonorrhea. Ask your health care provider if you are at risk.  If you do not  have HIV, but are at risk, it may be recommended that you take a prescription medicine daily to prevent HIV infection. This is called pre-exposure prophylaxis (PrEP). You are considered at risk if:  You are sexually active and do not regularly use condoms or know the HIV status of your partner(s).  You take drugs by injection.  You are sexually active with a partner who has HIV. Talk  with your health care provider about whether you are at high risk of being infected with HIV. If you choose to begin PrEP, you should first be tested for HIV. You should then be tested every 3 months for as long as you are taking PrEP.  PREGNANCY   If you are premenopausal and you may become pregnant, ask your health care provider about preconception counseling.  If you may become pregnant, take 400 to 800 micrograms (mcg) of folic acid every day.  If you want to prevent pregnancy, talk to your health care provider about birth control (contraception). OSTEOPOROSIS AND MENOPAUSE   Osteoporosis is a disease in which the bones lose minerals and strength with aging. This can result in serious bone fractures. Your risk for osteoporosis can be identified using a bone density scan.  If you are 86 years of age or older, or if you are at risk for osteoporosis and fractures, ask your health care provider if you should be screened.  Ask your health care provider whether you should take a calcium or vitamin D supplement to lower your risk for osteoporosis.  Menopause may have certain physical symptoms and risks.  Hormone replacement therapy may reduce some of these symptoms and risks. Talk to your health care provider about whether hormone replacement therapy is right for you.  HOME CARE INSTRUCTIONS   Schedule regular health, dental, and eye exams.  Stay current with your immunizations.   Do not use any tobacco products including cigarettes, chewing tobacco, or electronic cigarettes.  If you are pregnant, do not  drink alcohol.  If you are breastfeeding, limit how much and how often you drink alcohol.  Limit alcohol intake to no more than 1 drink per day for nonpregnant women. One drink equals 12 ounces of beer, 5 ounces of wine, or 1 ounces of hard liquor.  Do not use street drugs.  Do not share needles.  Ask your health care provider for help if you need support or information about quitting drugs.  Tell your health care provider if you often feel depressed.  Tell your health care provider if you have ever been abused or do not feel safe at home.   This information is not intended to replace advice given to you by your health care provider. Make sure you discuss any questions you have with your health care provider.   Document Released: 10/20/2010 Document Revised: 04/27/2014 Document Reviewed: 03/08/2013 Elsevier Interactive Patient Education [employer].

## 2015-07-18 NOTE — Progress Notes (Signed)
BP 130/78 mmHg  Pulse 73  Temp(Src) 97.5 F (36.4 C) (Oral)  Wt 206 lb 12 oz (93.781 kg)  SpO2 93%   CC: CPE  Subjective:    Patient ID: Ariel Smith, female    DOB: 1946-02-07, 70 y.o.   MRN: [id number]  HPI: Ariel Smith is a 70 y.o. female presenting on 07/18/2015 for Annual Exam   Medicare wellness visit with Virl Axe last week   Recent prosthetic aortic valve placement (TAVR).  Insomnia - ambien stopped. Xanax decreased to once daily. Trazodone increased to 50mg  nightly. This has not helped sleep. Asks about melatonin liquid form.  Chronic cough - trial of daily protonix didn't help cough. Now just takes PRN GERD symptoms. Codeine cough syrup never really helped either, declines refill today. Saw cards - recommended establishing with pulm. Has appt next month. Endorses some PNdrainage, rhinorrhea, nasal congestion.  Xanax on dialysis days has helped significantly. Hydroxyzine rest of days as needed for anxiety.   B12 shot - daughter asks if this is dialized  Metoprolol was stopped - bp remaining stable. Midodrin administed by dialysis center if needed.  Preventative: COLONOSCPY Date: 08/2011 colon biopsies, [name] Mammogram - normal 09/2014. Requests breast exam today. Well woman exam - normal pap 2015. Menopause at age 76. Denies vaginal bleeding. Requests pelvic exam today. DEXA Date: 01/2013 osteopenia with -2.0 at hip and spine Flu shot - yearly at dialysis Pneumovax - 01/2012, prevnar 2017 zostavax - unsure coverage - will check with insurance  Advanced directives - older daughter is HCPOA. Will bring [place] copy which daughter has at home. Seat belt use discussed No changing moles  Lives with husband.  Activity: no regular exercise Diet: limits 32 oz/day due to dialysis, some fruits/vegetables  Relevant past medical, surgical, family and social history reviewed and updated as indicated. Interim medical history since our last visit reviewed. Allergies and  medications reviewed and updated. Current Outpatient Prescriptions on File Prior to Visit  Medication Sig  . acetaminophen (TYLENOL) 500 MG tablet Take 1,000 mg by mouth every 6 (six) hours as needed for mild pain or headache.   . albuterol (VENTOLIN HFA) 108 (90 BASE) MCG/ACT inhaler Inhale 2 puffs into the lungs every 4 (four) hours as needed for wheezing or shortness of breath.  Marland Kitchen aspirin EC 325 MG EC tablet Take 1 tablet (325 mg total) by mouth daily.  . baclofen (LIORESAL) 10 MG tablet Take 0.5-1 tablets (5-10 mg total) by mouth 2 (two) times daily as needed for muscle spasms.  . budesonide-formoterol (SYMBICORT) 160-4.5 MCG/ACT inhaler Inhale 2 puffs into the lungs 2 (two) times daily.  . calcium acetate (PHOSLO) 667 MG capsule Take 667-2,001 mg by mouth 3 (three) times daily with meals. Take 3 with meals and 1 with a snack  . citalopram (CELEXA) 20 MG tablet Take 1 tablet (20 mg total) by mouth daily.  . clopidogrel (PLAVIX) 75 MG tablet Take 1 tablet (75 mg total) by mouth daily with breakfast.  . cyanocobalamin (,VITAMIN B-12,) 1000 MCG/ML injection Inject 1,000 mcg into the muscle every 30 (thirty) days.  Marland Kitchen donepezil (ARICEPT) 5 MG tablet TAKE ONE TABLET BY MOUTH AT BEDTIME  . furosemide (LASIX) 20 MG tablet Take 1 tablet (20 mg total) by mouth daily.  . hydrOXYzine (ATARAX/VISTARIL) 25 MG tablet Take 0.5 tablets (12.5 mg total) by mouth at bedtime.  . lidocaine-prilocaine (EMLA) cream Apply 1 application topically as needed (topical anesthesia for hemodialysis if Gebauers and Lidocaine injection are  ineffective.).  Marland Kitchen lovastatin (MEVACOR) 20 MG tablet TAKE ONE TABLET BY MOUTH AT BEDTIME  . midodrine (PROAMATINE) 5 MG tablet Take 1 tablet (5 mg total) by mouth every dialysis. Hold until you get to dialysis center  . pantoprazole (PROTONIX) 40 MG tablet Take 1 tablet (40 mg total) by mouth daily as needed (for heartburn/indigestion).   No current facility-administered medications on file  prior to visit.    Review of Systems  Constitutional: Negative for fever, chills, activity change, appetite change, fatigue and unexpected weight change.  HENT: Negative for hearing loss.   Eyes: Positive for visual disturbance.  Respiratory: Positive for cough and shortness of breath. Negative for chest tightness and wheezing.   Cardiovascular: Negative for chest pain, palpitations and leg swelling.  Gastrointestinal: Negative for nausea, vomiting, abdominal pain, diarrhea, constipation, blood in stool and abdominal distention.  Genitourinary: Negative for hematuria and difficulty urinating.  Musculoskeletal: Negative for myalgias, arthralgias and neck pain.  Skin: Negative for rash.  Neurological: Negative for dizziness, seizures, syncope and headaches.  Hematological: Negative for adenopathy. Does not bruise/bleed easily.  Psychiatric/Behavioral: Negative for dysphoric mood. The patient is not nervous/anxious.    Per HPI unless specifically indicated in ROS section     Objective:    BP 130/78 mmHg  Pulse 73  Temp(Src) 97.5 F (36.4 C) (Oral)  Wt 206 lb 12 oz (93.781 kg)  SpO2 93%  Wt Readings from Last 3 Encounters:  07/18/15 206 lb 12 oz (93.781 kg)  07/10/15 208 lb (94.348 kg)  07/03/15 208 lb (94.348 kg)    Physical Exam  Constitutional: She is oriented to person, place, and time. She appears well-developed and well-nourished. No distress.  HENT:  Head: Normocephalic and atraumatic.  Right Ear: Hearing, tympanic membrane, external ear and ear canal normal.  Left Ear: Hearing, tympanic membrane, external ear and ear canal normal.  Nose: Nose normal.  Mouth/Throat: Uvula is midline, oropharynx is clear and moist and mucous membranes are normal. No oropharyngeal exudate, posterior oropharyngeal edema or posterior oropharyngeal erythema.  Eyes: Conjunctivae and EOM are normal. Pupils are equal, round, and reactive to light. No scleral icterus.  Neck: Normal range of  motion. Neck supple. Carotid bruit is not present. No thyromegaly present.  Cardiovascular: Normal rate, regular rhythm, normal heart sounds and intact distal pulses.   No murmur heard. Pulses:      Radial pulses are 2+ on the right side, and 2+ on the left side.  Pulmonary/Chest: Effort normal and breath sounds normal. No respiratory distress. She has no wheezes. She has no rales. Right breast exhibits no inverted nipple, no mass, no nipple discharge, no skin change and no tenderness. Left breast exhibits no inverted nipple, no mass, no nipple discharge, no skin change and no tenderness.  Abdominal: Soft. Bowel sounds are normal. She exhibits no distension and no mass. There is no tenderness. There is no rebound and no guarding.  Genitourinary: Vagina normal and uterus normal. Pelvic exam was performed with patient supine. There is no rash, tenderness, lesion or injury on the right labia. There is no rash, tenderness, lesion or injury on the left labia. Cervix exhibits no motion tenderness. Right adnexum displays no mass, no tenderness and no fullness. Left adnexum displays no mass, no tenderness and no fullness.  Musculoskeletal: Normal range of motion. She exhibits no edema.  Lymphadenopathy:       Head (right side): No submental, no submandibular, no tonsillar, no preauricular and no posterior auricular adenopathy present.  Head (left side): No submental, no submandibular, no tonsillar, no preauricular and no posterior auricular adenopathy present.    She has no cervical adenopathy.    She has no axillary adenopathy.       Right axillary: No lateral adenopathy present.       Left axillary: No lateral adenopathy present.      Right: No supraclavicular adenopathy present.       Left: No supraclavicular adenopathy present.  Neurological: She is alert and oriented to person, place, and time.  CN grossly intact, station and gait intact  Skin: Skin is warm and dry. No rash noted.    Psychiatric: She has a normal mood and affect. Her behavior is normal. Judgment and thought content normal.  Nursing note and vitals reviewed.  Results for orders placed or performed in visit on 07/10/15  Lipid panel  Result Value Ref Range   Cholesterol 149 0 - 200 mg/dL   Triglycerides 409.8 0.0 - 149.0 mg/dL   HDL 11.91 (L) >47.82 mg/dL   VLDL 95.6 0.0 - 21.3 mg/dL   LDL Cholesterol 91 0 - 99 mg/dL   Total CHOL/HDL Ratio 4    NonHDL 114.28   Comprehensive metabolic panel  Result Value Ref Range   Sodium 141 135 - 145 mEq/L   Potassium 4.7 3.5 - 5.1 mEq/L   Chloride 100 96 - 112 mEq/L   CO2 31 19 - 32 mEq/L   Glucose, Bld 100 (H) 70 - 99 mg/dL   BUN 25 (H) 6 - 23 mg/dL   Creatinine, Ser 0.86 (HH) 0.40 - 1.20 mg/dL   Total Bilirubin 0.3 0.2 - 1.2 mg/dL   Alkaline Phosphatase 73 39 - 117 U/L   AST 13 0 - 37 U/L   ALT 8 0 - 35 U/L   Total Protein 6.8 6.0 - 8.3 g/dL   Albumin 3.8 3.5 - 5.2 g/dL   Calcium 9.7 8.4 - 57.8 mg/dL   GFR 4.69 (LL) >62.95 mL/min  CBC with Differential/Platelet  Result Value Ref Range   WBC 5.3 4.0 - 10.5 K/uL   RBC 3.09 (L) 3.87 - 5.11 Mil/uL   Hemoglobin 10.0 (L) 12.0 - 15.0 g/dL   HCT 28.4 (L) 13.2 - 44.0 %   MCV 99.1 78.0 - 100.0 fl   MCHC 32.6 30.0 - 36.0 g/dL   RDW 10.2 (H) 72.5 - 36.6 %   Platelets 179.0 150.0 - 400.0 K/uL   Neutrophils Relative % 71.6 43.0 - 77.0 %   Lymphocytes Relative 14.8 12.0 - 46.0 %   Monocytes Relative 9.7 3.0 - 12.0 %   Eosinophils Relative 3.4 0.0 - 5.0 %   Basophils Relative 0.5 0.0 - 3.0 %   Neutro Abs 3.8 1.4 - 7.7 K/uL   Lymphs Abs 0.8 0.7 - 4.0 K/uL   Monocytes Absolute 0.5 0.1 - 1.0 K/uL   Eosinophils Absolute 0.2 0.0 - 0.7 K/uL   Basophils Absolute 0.0 0.0 - 0.1 K/uL  VITAMIN D 25 Hydroxy (Vit-D Deficiency, Fractures)  Result Value Ref Range   VITD 19.64 (L) 30.00 - 100.00 ng/mL  Vitamin B12  Result Value Ref Range   Vitamin B-12 >1500 (H) 211 - 911 pg/mL      Assessment & Plan:   Problem  List Items Addressed This Visit    HTN (hypertension) (Chronic)    Largely resolved with dialysis. Takes lasix 20mg  daily. midodrin PRN hypotension after dialysis.      HLD (hyperlipidemia) (Chronic)    Chronic,  stable. Continue lovastatin.      ESRD (end stage renal disease) on dialysis (HCC) (Chronic)    Appreciate renal care of patient. Continue dialysis TThSat      GERD (gastroesophageal reflux disease) (Chronic)    Controlled with PRN PPI.      MDD (major depressive disorder), recurrent episode, moderate (HCC)    Stable on celexa 20mg  daily.  Stop trazodone as not effective for insomnia. Using xanax 1 tab prior to dialysis but then sleeps through dialysis - this may be contributing to insomnia. See below.      Relevant Medications   ALPRAZolam (XANAX) 1 MG tablet   Chronic cough    Did not improve with daily PPI trial. Endorses some allergic rhinitis sxs - will trial claritin 10mg  daily.  Pt has fu with pulm scheduled next month.      Osteopenia    DEXA showing osteopenia 2014. Consider rpt in near future.  Suggested discussing vit D def with dialysis - she starts calcitriol was stopped and she receives some form of vit D with dialysis.      Vitamin B12 deficiency    Daughter asks about whether B12 IM is being dialyzed. I will investigate this and update pt /family. May need to start oral replacement. UpToDate was down today.      Advanced care planning/counseling discussion    Advanced directives - older daughter is HCPOA. Will bring [place] copy which daughter has at home.      Health maintenance examination - Primary    Preventative protocols reviewed and updated unless pt declined. Discussed healthy diet and lifestyle.  Breast exam, pelvic exam without pap performed today.      Vitamin D deficiency    Calcitriol stopped. rec f/u with renal about replacement.      Obesity, Class II, BMI 35-39.9, with comorbidity (HCC)   Anemia of chronic disease    hgb stable  and improved at 10. ?aranesp related vs improved after TAVR (less hemolysis).      Insomnia    Trazodone, melatonin ineffective. Stop both. Ambien ineffective, has been discontinued.  Xanax works well for her. Discussed start 1/2 tab xanax prn sleep.  May continue xanax on dialysis days (better tolerates dialysis with this) but suggested trial 1/2 tab instead.          Follow up plan: Return in about 3 months (around 10/18/2015), or as needed, for follow up visit.

## 2015-07-18 NOTE — Assessment & Plan Note (Signed)
Did not improve with daily PPI trial. Endorses some allergic rhinitis sxs - will trial claritin 10mg  daily.  Pt has fu with pulm scheduled next month.

## 2015-07-18 NOTE — Assessment & Plan Note (Signed)
Appreciate renal care of patient. Continue dialysis TThSat

## 2015-07-18 NOTE — Assessment & Plan Note (Addendum)
Stable on celexa 20mg  daily.  Stop trazodone as not effective for insomnia. Using xanax 1 tab prior to dialysis but then sleeps through dialysis - this may be contributing to insomnia. See below.

## 2015-07-18 NOTE — Assessment & Plan Note (Signed)
DEXA showing osteopenia 2014. Consider rpt in near future.  Suggested discussing vit D def with dialysis - she starts calcitriol was stopped and she receives some form of vit D with dialysis.

## 2015-07-18 NOTE — Assessment & Plan Note (Signed)
Calcitriol stopped. rec f/u with renal about replacement.

## 2015-07-18 NOTE — Assessment & Plan Note (Addendum)
Trazodone, melatonin ineffective. Stop both. Ambien ineffective, has been discontinued.  Xanax works well for her. Discussed start 1/2 tab xanax prn sleep.  May continue xanax on dialysis days (better tolerates dialysis with this) but suggested trial 1/2 tab instead.

## 2015-07-18 NOTE — Assessment & Plan Note (Addendum)
Preventative protocols reviewed and updated unless pt declined. Discussed healthy diet and lifestyle.  Breast exam, pelvic exam without pap performed today.

## 2015-07-18 NOTE — Assessment & Plan Note (Signed)
Controlled with PRN PPI.

## 2015-07-18 NOTE — Assessment & Plan Note (Signed)
Daughter asks about whether B12 IM is being dialyzed. I will investigate this and update pt /family. May need to start oral replacement. UpToDate was down today.

## 2015-07-24 ENCOUNTER — Other Ambulatory Visit: Payer: Self-pay | Admitting: Family Medicine

## 2015-07-24 NOTE — Telephone Encounter (Signed)
Ok to refill 

## 2015-07-27 ENCOUNTER — Telehealth: Payer: Self-pay | Admitting: Family Medicine

## 2015-07-27 NOTE — Telephone Encounter (Signed)
plz notify I checked into B12 shots in dialysis - I think it should be ok to continue b12 shots even in hemodialysis. Would try and time right after dialysis. They can also check with renal doc next visit with them. Still recommend recheck b12 level next labwork and if better levels then may change to oral.  Another option may be nasal B12 but it may be more expensive.

## 2015-07-29 NOTE — Telephone Encounter (Signed)
Left message on voice mail  to call back

## 2015-07-30 NOTE — Telephone Encounter (Signed)
Left message on patient's voicemail to return call

## 2015-07-31 NOTE — Telephone Encounter (Signed)
Please call pt back, returned your call  Thanks

## 2015-07-31 NOTE — Telephone Encounter (Signed)
Patient notified as instructed by telephone and verbalized understanding. Appointment scheduled for B-12 injection per patient's request.

## 2015-08-02 ENCOUNTER — Other Ambulatory Visit: Payer: Medicare Other

## 2015-08-08 ENCOUNTER — Other Ambulatory Visit: Payer: Self-pay | Admitting: Family Medicine

## 2015-08-09 ENCOUNTER — Encounter: Payer: Medicare Other | Admitting: Family Medicine

## 2015-08-09 NOTE — Telephone Encounter (Signed)
Rout Rx request to PCP

## 2015-08-10 NOTE — Telephone Encounter (Signed)
plz phone in. 

## 2015-08-12 NOTE — Telephone Encounter (Signed)
Rx called in as directed.   

## 2015-08-14 ENCOUNTER — Ambulatory Visit: Payer: Medicare Other

## 2015-08-15 ENCOUNTER — Ambulatory Visit (INDEPENDENT_AMBULATORY_CARE_PROVIDER_SITE_OTHER): Payer: Medicare Other | Admitting: *Deleted

## 2015-08-15 DIAGNOSIS — E538 Deficiency of other specified B group vitamins: Secondary | ICD-10-CM

## 2015-08-15 MED ORDER — CYANOCOBALAMIN 1000 MCG/ML IJ SOLN
1000.0000 ug | Freq: Once | INTRAMUSCULAR | Status: AC
  Administered 2015-08-15: 1000 ug via INTRAMUSCULAR

## 2015-08-16 ENCOUNTER — Ambulatory Visit: Payer: Medicare Other | Admitting: Internal Medicine

## 2015-09-01 ENCOUNTER — Other Ambulatory Visit: Payer: Self-pay | Admitting: Family Medicine

## 2015-09-19 ENCOUNTER — Ambulatory Visit (INDEPENDENT_AMBULATORY_CARE_PROVIDER_SITE_OTHER): Payer: Medicare Other

## 2015-09-19 ENCOUNTER — Telehealth: Payer: Self-pay | Admitting: Cardiovascular Disease

## 2015-09-19 ENCOUNTER — Other Ambulatory Visit: Payer: Self-pay | Admitting: Family Medicine

## 2015-09-19 ENCOUNTER — Encounter: Payer: Self-pay | Admitting: Pulmonary Disease

## 2015-09-19 DIAGNOSIS — E538 Deficiency of other specified B group vitamins: Secondary | ICD-10-CM | POA: Diagnosis not present

## 2015-09-19 MED ORDER — CYANOCOBALAMIN 1000 MCG/ML IJ SOLN
1000.0000 ug | Freq: Once | INTRAMUSCULAR | Status: AC
  Administered 2015-09-19: 1000 ug via INTRAMUSCULAR

## 2015-09-19 NOTE — Telephone Encounter (Signed)
fyi patient does not wish to see a pulmonologist at this time.   Patient called back and changed her mind .  Will schedule .

## 2015-09-20 NOTE — Telephone Encounter (Signed)
plz phone in. 

## 2015-09-20 NOTE — Telephone Encounter (Signed)
Ok to refill 

## 2015-09-20 NOTE — Telephone Encounter (Signed)
Rx called in as directed.   

## 2015-09-26 ENCOUNTER — Encounter: Payer: Self-pay | Admitting: Pulmonary Disease

## 2015-09-26 ENCOUNTER — Ambulatory Visit (INDEPENDENT_AMBULATORY_CARE_PROVIDER_SITE_OTHER): Payer: Medicare Other | Admitting: Pulmonary Disease

## 2015-09-26 VITALS — BP 142/78 | HR 77 | Ht 64.0 in | Wt 215.0 lb

## 2015-09-26 DIAGNOSIS — I5032 Chronic diastolic (congestive) heart failure: Secondary | ICD-10-CM

## 2015-09-26 DIAGNOSIS — J449 Chronic obstructive pulmonary disease, unspecified: Secondary | ICD-10-CM

## 2015-09-26 DIAGNOSIS — R0902 Hypoxemia: Secondary | ICD-10-CM | POA: Diagnosis not present

## 2015-09-26 DIAGNOSIS — R05 Cough: Secondary | ICD-10-CM | POA: Diagnosis not present

## 2015-09-26 DIAGNOSIS — R0600 Dyspnea, unspecified: Secondary | ICD-10-CM | POA: Diagnosis not present

## 2015-09-26 DIAGNOSIS — R059 Cough, unspecified: Secondary | ICD-10-CM

## 2015-09-26 MED ORDER — UMECLIDINIUM-VILANTEROL 62.5-25 MCG/INH IN AEPB: 1.0000 | INHALATION_SPRAY | Freq: Every day | RESPIRATORY_TRACT | Status: DC

## 2015-09-26 MED ORDER — FLUTICASONE FUROATE-VILANTEROL 100-25 MCG/INH IN AEPB: 1.0000 | INHALATION_SPRAY | Freq: Every day | RESPIRATORY_TRACT | Status: DC

## 2015-09-26 MED ORDER — UMECLIDINIUM-VILANTEROL 62.5-25 MCG/INH IN AEPB
1.0000 | INHALATION_SPRAY | Freq: Every day | RESPIRATORY_TRACT | Status: AC
Start: 2015-09-26 — End: 2015-09-27

## 2015-09-26 NOTE — Progress Notes (Signed)
Patient ID: Ariel Smith, female   DOB: April 13, 1946, 70 y.o.   MRN: [id number]  Patient seen in the office today and instructed on use of anoro ellipta.  Patient expressed understanding and demonstrated technique.

## 2015-09-28 NOTE — Progress Notes (Signed)
PULMONARY CONSULT NOTE  Requesting MD/Service: [name] Date of initial consultation: 09/26/15 Reason for consultation: COPD, dyspnea  PT PROFILE: 70 y.o. F former smoker previously followed by Dr Meredeth Ide with oxygen dependence, "COPD" and disabling dyspnea.  PFTs 01/19/15: normal spirometry, lung volumes and DLCO appear invalid  HPI:  57 F referred for evaluation of disabling dyspnea. She was diagnosed with COPD several yrs ago. She reports class III/IV dypnea with little DTD variation, perhaps worse in hot, humid weather. She also reports daily cough (which she thinks is exacerbated by MDIs), minimally productive and denies CP, fever, purulent sputum, hemoptysis and calf tenderness. She also has history of TAVR in 02/19/15 for severe aortic stenosis (followed by Dr Excell Seltzer and Mariah Milling) and ESRD on HD X 5 yrs.    Past Medical History  Diagnosis Date  . Osteoarthritis   . Depression   . Frequent headaches   . HTN (hypertension)   . HLD (hyperlipidemia)   . History of colon polyps 2013    colonoscopy ([name])  . 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 GERD (gastroesophageal reflux disease)   . COPD (chronic obstructive pulmonary disease) (HCC)   . ESRD (end stage renal disease) (HCC) 08/2011    a. on HD (TThSa), L forearm AV fistula, [name]  . Secondary hyperparathyroidism of renal origin (HCC)   . Osteopenia 01/2013  . Bronchiectasis (HCC) 08/2014     suggested by thoracic xray  . Anemia of chronic disease   . Severe aortic stenosis     a. echo 10/2014: EF 60-65%, no RWMA, GR1DD, mod to sev AS (peak vel 377 cm/s, mean gradient 34 mm Hg, peak gradient 57 mm Hg, valve area (VTA) 0.72 cm^2  . Chronic respiratory failure (HCC)     a. 2/2 COPD; b. on 4-5L via nasal cannula  . Morbid obesity (HCC)   . GIB (gastrointestinal bleeding)     a. leading to cessation of warfarin 11/2014  .  Chronic diastolic CHF (congestive heart failure) (HCC)     a. echo 10/2014: EF 60-65%, no RWMA, GR1DD, severe AS, mild MR, PASP 46 mm Hg  . Pulmonary embolism (HCC) 2009  . Renal failure     Patient has been on dialysis for " four years" -per patient.  Marland Kitchen Heart murmur   . 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 Cholecystectomy  2012  . Bunionectomy Left 2003  . Replacement total knee Left 2006  . Shoulder arthroscopy Right 2009  . Colonoscopy  08/2011    colon biopsies, [name]  . Esophagogastroduodenoscopy  08/2011    gastric cardia polyp  . Tonsillectomy  1955  . Tubal ligation  1980  . Exteriorization of a continuous ambulatory peritoneal dialysis catheter  01/2013    removal 12/2103 - [name]  . Hospitalization  12/2013    recurrent R pleural effusion due to peritoneal fluid translocation s/p rpt thoracentesis with 1.3 L fluid removed, ERSD started on HD this hospitalization  . US echocardiography  12/2013    EF 55-60%, nl LV sys fxn, mild-mod MR, AS, increased LV posterior wall thickness, mild TR  . Esophagogastroduodenoscopy Left 11/11/2014    Procedure: ESOPHAGOGASTRODUODENOSCOPY (EGD);  Surgeon: [name],  MD;  Location: [facility] ENDOSCOPY;  Service: Endoscopy;  Laterality: Left;  . Peripheral vascular catheterization N/A 11/12/2014    Procedure: IVC Filter Insertion;  Surgeon: [name], MD;  Location: [facility] INVASIVE CV LAB;  Service: Cardiovascular;  Laterality: N/A;  . Cardiac catheterization N/A 01/18/2015    patent coronary arteries without significant osbtruction and preserved LV function, severe aortic stenosis; Procedure: Right/Left Heart Cath and Coronary Angiography;  Surgeon: Tonny Bollman, MD  . Transcatheter aortic valve replacement, transfemoral Right 02/19/2015    Procedure: TRANSCATHETER  AORTIC VALVE REPLACEMENT, TRANSFEMORAL;  Surgeon: Tonny Bollman, MD;  Location: [facility] OR;  Service: Open Heart Surgery;  Laterality: Right;  . Tee without cardioversion N/A 02/19/2015    Procedure: TRANSESOPHAGEAL ECHOCARDIOGRAM (TEE);  Surgeon: Tonny Bollman, MD;  Location: [facility] OR;  Service: Open Heart Surgery;  Laterality: N/A;    MEDICATIONS: I have reviewed all medications and confirmed regimen as documented  Social History   Social History  . Marital Status: Married    Spouse Name: N/A  . Number of Children: N/A  . Years of Education: N/A   Occupational History  . Not on file.   Social History Main Topics  . Smoking status: Former Smoker -- 2.00 packs/day for 30 years    Types: Cigarettes    Start date: 04/20/1978    Quit date: 04/21/2007  . Smokeless tobacco: Never Used  . Alcohol Use: No  . Drug Use: No  . Sexual Activity: No   Other Topics Concern  . Not on file   Social History Narrative   Activity: no regular exercise   Diet: limits 32 oz/day due to dialysis, some fruits/vegetables    Family History  Problem Relation Age of Onset  . Deep vein thrombosis Brother   . Cancer Mother 24    colon  . Stroke Mother   . Cancer Father     prostate  . Hypertension Father   . Dementia Father 33  . Hypertension Brother   . Diabetes Neg Hx   . CAD Neg Hx   . Cancer Daughter 19    breast    ROS: No fever, myalgias/arthralgias, unexplained weight loss or weight gain No new focal weakness or sensory deficits No otalgia, hearing loss, visual changes, nasal and sinus symptoms, mouth and throat problems No neck pain or adenopathy No abdominal pain, N/V/D, diarrhea, change in bowel pattern No dysuria, change in urinary pattern   Filed Vitals:   09/26/15 1054  BP: 142/78  Pulse: 77  Height:  (1.626 m)  Weight: 215 lb (97.523 kg)  SpO2: 96%   3 lpm pulse  EXAM:  Gen: chronically ill appearing, obese, No overt respiratory distress @ rest HEENT: NCAT,  sclera white, oropharynx normal Neck: Supple without LAN, thyromegaly, JVD Lungs: breath sounds: slightly coarse, percussion: normal, No wheezes Cardiovascular: RRR, + systolic M Abdomen: Obese, soft, nontender, normal BS Ext: without clubbing, cyanosis. 1+ symmetric edema Neuro: CNs grossly intact, motor and sensory intact Skin: Limited exam, no lesions noted  DATA:   BMP Latest Ref Rng 07/10/2015 02/21/2015 02/20/2015  Glucose 70 - 99 mg/dL 098(J) 191(Y) 80  BUN 6 - 23 mg/dL 78(G) 95(A) 21(H)  Creatinine 0.40 - 1.20 mg/dL 0.86(VH) 8.46(N) 6.29(B)  Sodium 135 - 145 mEq/L 141 139 142  Potassium 3.5 - 5.1 mEq/L 4.7 4.2 4.2  Chloride 96 - 112 mEq/L 100 98(L) 99(L)  CO2 19 - 32 mEq/L Calcium 8.4 - 10.5 mg/dL 9.7 9.9  9.4    CBC Latest Ref Rng 07/10/2015 02/21/2015 02/20/2015  WBC 4.0 - 10.5 K/uL 5.3 5.1 6.1  Hemoglobin 12.0 - 15.0 g/dL 10.0(L) 8.3(L) 8.7(L)  Hematocrit 36.0 - 46.0 % 30.6(L) 26.6(L) 27.8(L)  Platelets 150.0 - 400.0 K/uL 179.0 131(L) 140(L)    CXR (02/21/15): mild interstitial prominence TTE (04/01/15): moderate LVH, LVEF 60-65%, G2DD, mild MP, mild increased size of LA, PASP estimated @ 30 mmHg    IMPRESSION:     ICD-9-CM ICD-10-CM   1. Dyspnea 786.09 R06.00 Pulmonary function test     DG Chest 2 View     6 minute walk  2. Hypoxemia 799.02 R09.02 Pulmonary function test     DG Chest 2 View     6 minute walk  3. Cough 786.2 R05   4. Chronic obstructive pulmonary disease, unspecified COPD type (HCC) 496 J44.9   5. Chronic diastolic congestive heart failure (HCC) 428.32 I50.32    428.0    1) Prior diagnosis of COPD but no definite obstruction on spirometry 2016 2) Chronic hypoxemia - unclear etiology 3) disabling dyspnea - suspect more on the basis of cardiac, renal diseases and deconditioning   PLAN:  1) DC symbicort 2) Trial Anoro inhaler 3) Cont PRN albuterol 4) ROV 4-6 weeks with PFTs, CXR, 6 MWT   Billy Fischer, MD PCCM service Mobile  [phone] Pager [phone] 09/28/2015

## 2015-10-11 ENCOUNTER — Encounter: Payer: Self-pay | Admitting: Emergency Medicine

## 2015-10-11 ENCOUNTER — Emergency Department
Admission: EM | Admit: 2015-10-11 | Discharge: 2015-10-11 | Disposition: A | Discharge status: Home / Self Care | Payer: Medicare Other | Attending: Emergency Medicine | Admitting: Emergency Medicine

## 2015-10-11 ENCOUNTER — Emergency Department: Payer: Medicare Other

## 2015-10-11 DIAGNOSIS — I5032 Chronic diastolic (congestive) heart failure: Secondary | ICD-10-CM | POA: Diagnosis not present

## 2015-10-11 DIAGNOSIS — F331 Major depressive disorder, recurrent, moderate: Secondary | ICD-10-CM | POA: Diagnosis not present

## 2015-10-11 DIAGNOSIS — M1990 Unspecified osteoarthritis, unspecified site: Secondary | ICD-10-CM | POA: Diagnosis not present

## 2015-10-11 DIAGNOSIS — Z992 Dependence on renal dialysis: Secondary | ICD-10-CM | POA: Insufficient documentation

## 2015-10-11 DIAGNOSIS — M79605 Pain in left leg: Secondary | ICD-10-CM | POA: Diagnosis present

## 2015-10-11 DIAGNOSIS — I132 Hypertensive heart and chronic kidney disease with heart failure and with stage 5 chronic kidney disease, or end stage renal disease: Secondary | ICD-10-CM | POA: Diagnosis not present

## 2015-10-11 DIAGNOSIS — Z7982 Long term (current) use of aspirin: Secondary | ICD-10-CM | POA: Insufficient documentation

## 2015-10-11 DIAGNOSIS — Z79899 Other long term (current) drug therapy: Secondary | ICD-10-CM | POA: Insufficient documentation

## 2015-10-11 DIAGNOSIS — E785 Hyperlipidemia, unspecified: Secondary | ICD-10-CM | POA: Insufficient documentation

## 2015-10-11 DIAGNOSIS — J449 Chronic obstructive pulmonary disease, unspecified: Secondary | ICD-10-CM | POA: Insufficient documentation

## 2015-10-11 DIAGNOSIS — I82402 Acute embolism and thrombosis of unspecified deep veins of left lower extremity: Secondary | ICD-10-CM

## 2015-10-11 DIAGNOSIS — Z96659 Presence of unspecified artificial knee joint: Secondary | ICD-10-CM | POA: Insufficient documentation

## 2015-10-11 DIAGNOSIS — Z87891 Personal history of nicotine dependence: Secondary | ICD-10-CM | POA: Insufficient documentation

## 2015-10-11 DIAGNOSIS — I8002 Phlebitis and thrombophlebitis of superficial vessels of left lower extremity: Secondary | ICD-10-CM

## 2015-10-11 DIAGNOSIS — N186 End stage renal disease: Secondary | ICD-10-CM | POA: Insufficient documentation

## 2015-10-11 LAB — CBC WITH DIFFERENTIAL/PLATELET
BASOS ABS: 0.1 10*3/uL (ref 0–0.1)
Basophils Relative: 1 %
EOS PCT: 2 %
Eosinophils Absolute: 0.1 10*3/uL (ref 0–0.7)
HCT: 35.4 % (ref 35.0–47.0)
Hemoglobin: 11.6 g/dL — ABNORMAL LOW (ref 12.0–16.0)
LYMPHS PCT: 13 %
Lymphs Abs: 0.8 10*3/uL — ABNORMAL LOW (ref 1.0–3.6)
MCH: 32.7 pg (ref 26.0–34.0)
MCHC: 32.7 g/dL (ref 32.0–36.0)
MCV: 100 fL (ref 80.0–100.0)
MONO ABS: 0.4 10*3/uL (ref 0.2–0.9)
Monocytes Relative: 7 %
Neutro Abs: 4.8 10*3/uL (ref 1.4–6.5)
Neutrophils Relative %: 77 %
PLATELETS: 130 10*3/uL — AB (ref 150–440)
RBC: 3.54 MIL/uL — AB (ref 3.80–5.20)
RDW: 16.8 % — AB (ref 11.5–14.5)
WBC: 6.2 10*3/uL (ref 3.6–11.0)

## 2015-10-11 LAB — COMPREHENSIVE METABOLIC PANEL
ALBUMIN: 3.7 g/dL (ref 3.5–5.0)
ALT: 15 U/L (ref 14–54)
AST: 27 U/L (ref 15–41)
Alkaline Phosphatase: 92 U/L (ref 38–126)
Anion gap: 13 (ref 5–15)
BUN: 44 mg/dL — AB (ref 6–20)
CHLORIDE: 101 mmol/L (ref 101–111)
CO2: 22 mmol/L (ref 22–32)
CREATININE: 7.71 mg/dL — AB (ref 0.44–1.00)
Calcium: 9.7 mg/dL (ref 8.9–10.3)
GFR calc Af Amer: 6 mL/min — ABNORMAL LOW (ref >60)
GFR calc non Af Amer: 5 mL/min — ABNORMAL LOW (ref >60)
GLUCOSE: 115 mg/dL — AB (ref 65–99)
POTASSIUM: 4.9 mmol/L (ref 3.5–5.1)
SODIUM: 136 mmol/L (ref 135–145)
Total Bilirubin: 0.3 mg/dL (ref 0.3–1.2)
Total Protein: 6.8 g/dL (ref 6.5–8.1)

## 2015-10-11 LAB — PROTIME-INR
INR: 1.06
PROTHROMBIN TIME: 14 s (ref 11.4–15.0)

## 2015-10-11 LAB — APTT: aPTT: 29 seconds (ref 24–36)

## 2015-10-11 LAB — FIBRIN DERIVATIVES D-DIMER (ARMC ONLY): FIBRIN DERIVATIVES D-DIMER (ARMC): 3951 — AB (ref 0–499)

## 2015-10-11 MED ORDER — HYDROCODONE-ACETAMINOPHEN 5-325 MG PO TABS
1.0000 | ORAL_TABLET | Freq: Once | ORAL | Status: AC
  Administered 2015-10-11: 1 via ORAL
  Filled 2015-10-11: qty 1

## 2015-10-11 MED ORDER — HYDROCODONE-ACETAMINOPHEN 5-325 MG PO TABS: 1.0000 | ORAL_TABLET | Freq: Four times a day (QID) | ORAL | Status: DC | PRN

## 2015-10-11 NOTE — ED Notes (Signed)
Patient presents to Emergency Department via EMS with complaints of left ankle pain that turned to leg pain.  Pt was at dialysis this am but was NOT dialyzed d/t to possible clot.  Painful redness in linear pattern on lateral left calf that goes up to posterior knee.

## 2015-10-11 NOTE — ED Notes (Signed)
Pt transported to ultrasound.

## 2015-10-11 NOTE — ED Provider Notes (Signed)
[facility] Emergency Department Provider Note   ____________________________________________  Time seen: Approximately 7:10am I have reviewed the triage vital signs and the triage nursing note.  HISTORY  Chief Complaint Leg Pain   Historian Patient  HPI Ariel Smith is a 70 y.o. female brought in by EMS from dialysis complaining of 2 days of left leg pain, medial calf to thigh, worsening.  She has tried heating blanket with some relief.  Prior history of DVT.  No shortness of breath or chest pain.  No fever.  Dialyzes on MWF and last had dialysis on Wednesday.  Pain leg is moderate to severe.  There is some red streaking there.    Past Medical History  Diagnosis Date  . Osteoarthritis   . Depression   . Frequent headaches   . HTN (hypertension)   . HLD (hyperlipidemia)   . History of colon polyps 2013    colonoscopy ([name])  . 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 GERD (gastroesophageal reflux disease)   . COPD (chronic obstructive pulmonary disease) (HCC)   . ESRD (end stage renal disease) (HCC) 08/2011    a. on HD (TThSa), L forearm AV fistula, [name]  . Secondary hyperparathyroidism of renal origin (HCC)   . Osteopenia 01/2013  . Bronchiectasis (HCC) 08/2014     suggested by thoracic xray  . Anemia of chronic disease   . Severe aortic stenosis     a. echo 10/2014: EF 60-65%, no RWMA, GR1DD, mod to sev AS (peak vel 377 cm/s, mean gradient 34 mm Hg, peak gradient 57 mm Hg, valve area (VTA) 0.72 cm^2  . Chronic respiratory failure (HCC)     a. 2/2 COPD; b. on 4-5L via nasal cannula  . Morbid obesity (HCC)   . GIB (gastrointestinal bleeding)     a. leading to cessation of warfarin 11/2014  . Chronic diastolic CHF (congestive heart failure) (HCC)     a. echo 10/2014: EF 60-65%, no RWMA, GR1DD, severe AS, mild MR, PASP 46 mm Hg  . Pulmonary embolism  (HCC) 2009  . Renal failure     Patient has been on dialysis for " four years" -per patient.  Marland Kitchen Heart murmur   . 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 Fall from bed 05/17/2015  . Insomnia 04/08/2015  . 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 Chronic respiratory failure (HCC)   . Anemia of chronic disease   . Chronic diastolic CHF (congestive heart failure) (HCC)   . Obesity, Class II, BMI 35-39.9, with comorbidity (HCC) 01/07/2015  . COPD (chronic obstructive pulmonary disease) (HCC) 12/20/2014  . DVT (deep venous thrombosis) (HCC) 11/16/2014  . Aortic valvar stenosis 11/16/2014  . Vitamin D deficiency 11/03/2014  . Memory deficit 11/01/2014  . Right-sided thoracic back pain 08/17/2014  . Advanced care planning/counseling discussion 03/30/2014  . Health maintenance examination 03/30/2014  . Vitamin B12 deficiency 01/27/2014  . Medicare annual wellness visit, subsequent 01/27/2013  . Osteopenia 01/18/2013  . Chronic cough 10/22/2012  . Osteoarthritis   . MDD (major depressive disorder), recurrent episode, moderate (HCC)   . HTN (hypertension)   . HLD (hyperlipidemia)   . GERD (gastroesophageal reflux disease)   .  ESRD (end stage renal disease) on dialysis (HCC) 08/19/2011    Past Surgical History  Procedure Laterality Date  . Cholecystectomy  2012  . Bunionectomy Left 2003  . Replacement total knee Left 2006  . Shoulder arthroscopy Right 2009  . Colonoscopy  08/2011    colon biopsies, [name]  . Esophagogastroduodenoscopy  08/2011    gastric cardia polyp  . Tonsillectomy  1955  . Tubal ligation  1980  . Exteriorization of a continuous ambulatory peritoneal dialysis catheter  01/2013    removal 12/2103 - [name]  . Hospitalization  12/2013    recurrent R pleural  effusion due to peritoneal fluid translocation s/p rpt thoracentesis with 1.3 L fluid removed, ERSD started on HD this hospitalization  . US echocardiography  12/2013    EF 55-60%, nl LV sys fxn, mild-mod MR, AS, increased LV posterior wall thickness, mild TR  . Esophagogastroduodenoscopy Left 11/11/2014    Procedure: ESOPHAGOGASTRODUODENOSCOPY (EGD);  Surgeon: [name], MD;  Location: [facility] ENDOSCOPY;  Service: Endoscopy;  Laterality: Left;  . Peripheral vascular catheterization N/A 11/12/2014    Procedure: IVC Filter Insertion;  Surgeon: [name], MD;  Location: [facility] INVASIVE CV LAB;  Service: Cardiovascular;  Laterality: N/A;  . Cardiac catheterization N/A 01/18/2015    patent coronary arteries without significant osbtruction and preserved LV function, severe aortic stenosis; Procedure: Right/Left Heart Cath and Coronary Angiography;  Surgeon: Tonny Bollman, MD  . Transcatheter aortic valve replacement, transfemoral Right 02/19/2015    Procedure: TRANSCATHETER AORTIC VALVE REPLACEMENT, TRANSFEMORAL;  Surgeon: Tonny Bollman, MD;  Location: [facility] OR;  Service: Open Heart Surgery;  Laterality: Right;  . Tee without cardioversion N/A 02/19/2015    Procedure: TRANSESOPHAGEAL ECHOCARDIOGRAM (TEE);  Surgeon: Tonny Bollman, MD;  Location: [facility] OR;  Service: Open Heart Surgery;  Laterality: N/A;    Current Outpatient Rx  Name  Route  Sig  Dispense  Refill  . acetaminophen (TYLENOL) 500 MG tablet   Oral   Take 1,000 mg by mouth every 6 (six) hours as needed for mild pain or headache.          . albuterol (VENTOLIN HFA) 108 (90 BASE) MCG/ACT inhaler   Inhalation   Inhale 2 puffs into the lungs every 4 (four) hours as needed for wheezing or shortness of breath.   18 g   3   . ALPRAZolam (XANAX) 1 MG tablet      TAKE ONE TABLET BY MOUTH ONCE DAILY AS NEEDED   30 tablet   0   . aspirin EC 325 MG EC tablet   Oral   Take 1 tablet (325 mg total) by mouth daily.   30 tablet   0   . baclofen  (LIORESAL) 10 MG tablet   Oral   Take 0.5-1 tablets (5-10 mg total) by mouth 2 (two) times daily as needed for muscle spasms.   30 each   0   . budesonide-formoterol (SYMBICORT) 160-4.5 MCG/ACT inhaler   Inhalation   Inhale 2 puffs into the lungs 2 (two) times daily.         . calcium acetate (PHOSLO) 667 MG capsule   Oral   Take 667-2,001 mg by mouth 3 (three) times daily with meals. Take 3 with meals and 1 with a snack         . citalopram (CELEXA) 20 MG tablet   Oral   Take 1 tablet (20 mg total) by mouth daily.   90 tablet   3   .  clopidogrel (PLAVIX) 75 MG tablet   Oral   Take 1 tablet (75 mg total) by mouth daily with breakfast.   30 tablet   3   . cyanocobalamin (,VITAMIN B-12,) 1000 MCG/ML injection   Intramuscular   Inject 1,000 mcg into the muscle every 30 (thirty) days.         Marland Kitchen donepezil (ARICEPT) 5 MG tablet      TAKE ONE TABLET BY MOUTH AT BEDTIME   30 tablet   6   . furosemide (LASIX) 20 MG tablet   Oral   Take 1 tablet (20 mg total) by mouth daily.   30 tablet   6   . hydrOXYzine (ATARAX/VISTARIL) 25 MG tablet      TAKE ONE-HALF TABLET BY MOUTH AT BEDTIME   30 tablet   0   . lidocaine-prilocaine (EMLA) cream   Topical   Apply 1 application topically as needed (topical anesthesia for hemodialysis if Gebauers and Lidocaine injection are ineffective.).   30 g   0   . lovastatin (MEVACOR) 20 MG tablet      TAKE ONE TABLET BY MOUTH AT BEDTIME   90 tablet   3   . midodrine (PROAMATINE) 5 MG tablet   Oral   Take 1 tablet (5 mg total) by mouth every dialysis. Hold until you get to dialysis center         . pantoprazole (PROTONIX) 40 MG tablet   Oral   Take 1 tablet (40 mg total) by mouth daily as needed (for heartburn/indigestion).   90 tablet   1   . traZODone (DESYREL) 50 MG tablet            3   . umeclidinium-vilanterol (ANORO ELLIPTA) 62.5-25 MCG/INH AEPB   Inhalation   Inhale 1 puff into the lungs daily.   60 each    5     Allergies Nodolor  Family History  Problem Relation Age of Onset  . Deep vein thrombosis Brother   . Cancer Mother 38    colon  . Stroke Mother   . Cancer Father     prostate  . Hypertension Father   . Dementia Father 9  . Hypertension Brother   . Diabetes Neg Hx   . CAD Neg Hx   . Cancer Daughter 21    breast    Social History Social History  Substance Use Topics  . Smoking status: Former Smoker -- 2.00 packs/day for 30 years    Types: Cigarettes    Start date: 04/20/1978    Quit date: 04/21/2007  . Smokeless tobacco: Never Used  . Alcohol Use: No    Review of Systems  Constitutional: Negative for fever. Eyes: Negative for visual changes. ENT: Negative for sore throat. Cardiovascular: Negative for chest pain. Respiratory: Negative for shortness of breath. Gastrointestinal: Negative for abdominal pain, vomiting and diarrhea. Genitourinary: Negative for dysuria. Musculoskeletal: Negative for back pain. Skin: Red streaking on left leg Neurological: Negative for headache. 10 point Review of Systems otherwise negative ____________________________________________   PHYSICAL EXAM:  VITAL SIGNS: ED Triage Vitals  Enc Vitals Group     BP 10/11/15 0657 154/81 mmHg     Pulse Rate 10/11/15 0657 81     Resp 10/11/15 0657 19     Temp 10/11/15 0657 97.9 F (36.6 C)     Temp Source 10/11/15 0657 Oral     SpO2 10/11/15 0657 100 %     Weight 10/11/15 0657 215 lb (97.523 kg)  Height 10/11/15 0657 5\' 4"  (1.626 m)     Head Cir --      Peak Flow --      Pain Score 10/11/15 0700 6     Pain Loc --      Pain Edu? --      Excl. in GC? --      Constitutional: Alert and oriented. Well appearing and in no distress. HEENT   Head: Normocephalic and atraumatic.      Eyes: Conjunctivae are normal. PERRL. Normal extraocular movements.      Ears:         Nose: No congestion/rhinnorhea.   Mouth/Throat: Mucous membranes are moist.   Neck: No  stridor. Cardiovascular/Chest: Normal rate, regular rhythm.  No murmurs, rubs, or gallops. Respiratory: Normal respiratory effort without tachypnea nor retractions. Breath sounds are clear and equal bilaterally. No wheezes/rales/rhonchi. Gastrointestinal: Soft. No distention, no guarding, no rebound. Nontender. Genitourinary/rectal:Deferred Musculoskeletal: Obese legs, no pitting edema.  Red streaking mid inner calf to thigh which has a painful and palpable cord. Neurologic:  Normal speech and language. No gross or focal neurologic deficits are appreciated. Skin:  Skin is warm, dry and intact. No rash noted. Psychiatric: Mood and affect are normal. Speech and behavior are normal. Patient exhibits appropriate insight and judgment.  ____________________________________________   EKG I, Governor Rooks, MD, the attending physician have personally viewed and interpreted all ECGs.  None ____________________________________________  LABS (pertinent positives/negatives)  Labs Reviewed  FIBRIN DERIVATIVES D-DIMER ([facility] ONLY) - Abnormal; Notable for the following:    Fibrin derivatives D-dimer [facility]) 3951 (*)    All other components within normal limits  CBC WITH DIFFERENTIAL/PLATELET - Abnormal; Notable for the following:    RBC 3.54 (*)    Hemoglobin 11.6 (*)    RDW 16.8 (*)    Platelets 130 (*)    Lymphs Abs 0.8 (*)    All other components within normal limits  COMPREHENSIVE METABOLIC PANEL - Abnormal; Notable for the following:    Glucose, Bld 115 (*)    BUN 44 (*)    Creatinine, Ser 7.71 (*)    GFR calc non Af Amer 5 (*)    GFR calc Af Amer 6 (*)    All other components within normal limits  APTT  PROTIME-INR    ____________________________________________  RADIOLOGY All Xrays were viewed by me. Imaging interpreted by Radiologist.  US doppler lle:   IMPRESSION: Positive for deep venous thrombosis with partially occlusive thrombus seen at the saphenous femoral junction,  femoral vein and popliteal vein. Based on comparison with prior studies, most of this thrombus appears to be chronic. __________________________________________  PROCEDURES  Procedure(s) performed: None  Critical Care performed: None  ____________________________________________   ED COURSE / ASSESSMENT AND PLAN  Pertinent labs & imaging results that were available during my care of the patient were reviewed by me and considered in my medical decision making (see chart for details).  No fevers or blood cell count normal, and palpable cord, clinically suspect superficial thrombophlebitis over cellulitis or DVT, but will obtain US to r.o DVT with history of similar.  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  I did review patient's medical chart history and found that in 2010 she was diagnosed with a right lower extremity DVT, and in 2013 her right lower sternal a DVT had resolved, but there is a left lower extremity DVT.  In July 2016 patient had GI bleeding while on Coumadin for treatment of DVT and was  taken off of Coumadin and had an IVC filter placed.  This past November, 2016 patient had a nonmechanical aortic valve replacement and was initiated on aspirin and Plavix for 6 months which she is still currently on.  I spoke with hematologist on call Dr. Merlene Pulling and she will see this patient in follow-up, although did not recommend any change in her medication management at this point because she has IVC filter and the DVT appears to be unchanged from prior.  I spoke with the patient and her family members about treating for superficial thrombophlebitis symptomatically.    CONSULTATIONS:   Phone consultation with Dr. Merlene Pulling, hematology.   Patient / Family / Caregiver informed of clinical course, medical decision-making process, and agree with plan.   I discussed return precautions, follow-up instructions, and discharged instructions with patient and/or  family.   ___________________________________________   FINAL CLINICAL IMPRESSION(S) / ED DIAGNOSES   Final diagnoses:  Superficial thrombophlebitis of left leg  Deep vein thrombosis (DVT) of left lower extremity, unspecified chronicity, unspecified vein (HCC)              Note: This dictation was prepared with Dragon dictation. Any transcriptional errors that result from this process are unintentional   Governor Rooks, MD 10/11/15 1036

## 2015-10-11 NOTE — Discharge Instructions (Signed)
You were evaluated for leg pain and found to have clot of superficial vein called thrombophlebitis.  Treat with warm towel to the area.  Return to the ER for any new or worsening pain, redness, rash, fever, swelling, or any trouble breathing.  There was a Deep Vein Clot seen, called DVT, on the left side, which appeared similar to that seen previously.  You have an IVC filter which should help prevent blood clots to the lung.  You cannot be on other DVT blood thinner medications because of the internal bleeding previously.   You may continue your current Aspirin and Plavix for your aortic valve replacement. Given your blood clot issues, I am recommending you see a blood specialist called hematologist, Dr. Merlene Pulling located at the [facility].  Call to make an appointment.  Phlebitis Phlebitis is soreness and swelling (inflammation) of a vein. This can occur in your arms, legs, or torso (trunk), as well as deeper inside your body. Phlebitis is usually not serious when it occurs close to the surface of the body. However, it can cause serious problems when it occurs in a vein deeper inside the body. CAUSES  Phlebitis can be triggered by various things, including:   Reduced blood flow through your veins. This can happen with:  Bed rest over a long period.  Long-distance travel.  Injury.  Surgery.  Being overweight (obese) or pregnant.  Having an IV tube put in the vein and getting certain medicines through the vein.  Cancer and cancer treatment.  Use of illegal drugs taken through the vein.  Inflammatory diseases.  Inherited (genetic) diseases that increase the risk of blood clots.  Hormone therapy, such as birth control pills. SIGNS AND SYMPTOMS   Red, tender, swollen, and painful area on your skin. Usually, the area will be long and narrow.  Firmness along the center of the affected area. This can indicate that a blood clot has formed.  Low-grade fever. DIAGNOSIS  A health  care provider can usually diagnose phlebitis by examining the affected area and asking about your symptoms. To check for infection or blood clots, your health care provider may order blood tests or an ultrasound exam of the area. Blood tests and your family history may also indicate if you have an underlying genetic disease that causes blood clots. Occasionally, a piece of tissue is taken from the body (biopsy sample) if an unusual cause of phlebitis is suspected. TREATMENT  Treatment will vary depending on the severity of the condition and the area of the body affected. Treatment may include:  Use of a warm compress or heating pad.  Use of compression stockings or bandages.  Anti-inflammatory medicines.  Removal of any IV tube that may be causing the problem.  Medicines that kill germs (antibiotics) if an infection is present.  Blood-thinning medicines if a blood clot is suspected or present.  In rare cases, surgery may be needed to remove damaged sections of vein. HOME CARE INSTRUCTIONS   Only take over-the-counter or prescription medicines as directed by your health care provider. Take all medicines exactly as prescribed.  Raise (elevate) the affected area above the level of your heart as directed by your health care provider.  Apply a warm compress or heating pad to the affected area as directed by your health care provider. Do not sleep with the heating pad.  Use compression stockings or bandages as directed. These will speed healing and prevent the condition from coming back.  If you are on blood  thinners:  Get follow-up blood tests as directed by your health care provider.  Check with your health care provider before using any new medicines.  Carry a medical alert card or wear your medical alert jewelry to show that you are on blood thinners.  For phlebitis in the legs:  Avoid prolonged standing or bed rest.  Keep your legs moving. Raise your legs when sitting or  lying.  Do not smoke.  Women, particularly those over the age of 42, should consider the risks and benefits of taking the contraceptive pill. This kind of hormone treatment can increase your risk for blood clots.  Follow up with your health care provider as directed. SEEK MEDICAL CARE IF:   You have unusual bruising or any bleeding problems.  Your swelling or pain in the affected area is not improving.  You are on anti-inflammatory medicine, and you develop belly (abdominal) pain. SEEK IMMEDIATE MEDICAL CARE IF:   You have a sudden onset of chest pain or difficulty breathing.  You have a fever or persistent symptoms for more than 2-3 days.  You have a fever and your symptoms suddenly get worse. MAKE SURE YOU:  Understand these instructions.  Will watch your condition.  Will get help right away if you are not doing well or get worse.   This information is not intended to replace advice given to you by your health care provider. Make sure you discuss any questions you have with your health care provider.   Document Released: 03/31/2001 Document Revised: 01/25/2013 Document Reviewed: 12/12/2012 Elsevier Interactive Patient Education [employer]

## 2015-10-13 ENCOUNTER — Other Ambulatory Visit: Payer: Self-pay | Admitting: Cardiovascular Disease

## 2015-10-15 ENCOUNTER — Other Ambulatory Visit: Payer: Self-pay | Admitting: Family Medicine

## 2015-10-15 NOTE — Telephone Encounter (Signed)
Confirmed with Mardene Celeste that [name] would take over plavix refills, as pt is seen in [place] office with him.

## 2015-10-16 ENCOUNTER — Other Ambulatory Visit: Payer: Self-pay | Admitting: Family Medicine

## 2015-10-16 NOTE — Telephone Encounter (Signed)
Rx called in as prescribed 

## 2015-10-16 NOTE — Telephone Encounter (Signed)
Ok to refill 

## 2015-10-16 NOTE — Telephone Encounter (Signed)
plz phone in. 

## 2015-10-17 NOTE — Telephone Encounter (Signed)
Called in to [facility] on [address].

## 2015-10-17 NOTE — Telephone Encounter (Signed)
plz phone in. 

## 2015-10-21 ENCOUNTER — Encounter: Payer: Self-pay | Admitting: Emergency Medicine

## 2015-10-21 ENCOUNTER — Emergency Department: Payer: Medicare Other

## 2015-10-21 ENCOUNTER — Inpatient Hospital Stay
Admission: EM | Admit: 2015-10-21 | Discharge: 2015-10-25 | DRG: 270 | Disposition: A | Discharge status: Home Health | Payer: Medicare Other | Attending: Internal Medicine | Admitting: Internal Medicine

## 2015-10-21 DIAGNOSIS — I82402 Acute embolism and thrombosis of unspecified deep veins of left lower extremity: Secondary | ICD-10-CM | POA: Diagnosis present

## 2015-10-21 DIAGNOSIS — D6851 Activated protein C resistance: Secondary | ICD-10-CM | POA: Diagnosis present

## 2015-10-21 DIAGNOSIS — L03116 Cellulitis of left lower limb: Secondary | ICD-10-CM

## 2015-10-21 DIAGNOSIS — Z6836 Body mass index (BMI) 36.0-36.9, adult: Secondary | ICD-10-CM | POA: Diagnosis not present

## 2015-10-21 DIAGNOSIS — M1990 Unspecified osteoarthritis, unspecified site: Secondary | ICD-10-CM | POA: Diagnosis present

## 2015-10-21 DIAGNOSIS — Z87891 Personal history of nicotine dependence: Secondary | ICD-10-CM

## 2015-10-21 DIAGNOSIS — I82502 Chronic embolism and thrombosis of unspecified deep veins of left lower extremity: Secondary | ICD-10-CM | POA: Diagnosis present

## 2015-10-21 DIAGNOSIS — I132 Hypertensive heart and chronic kidney disease with heart failure and with stage 5 chronic kidney disease, or end stage renal disease: Secondary | ICD-10-CM | POA: Diagnosis present

## 2015-10-21 DIAGNOSIS — N2581 Secondary hyperparathyroidism of renal origin: Secondary | ICD-10-CM | POA: Diagnosis present

## 2015-10-21 DIAGNOSIS — E785 Hyperlipidemia, unspecified: Secondary | ICD-10-CM | POA: Diagnosis present

## 2015-10-21 DIAGNOSIS — K219 Gastro-esophageal reflux disease without esophagitis: Secondary | ICD-10-CM | POA: Diagnosis present

## 2015-10-21 DIAGNOSIS — Z79899 Other long term (current) drug therapy: Secondary | ICD-10-CM | POA: Diagnosis not present

## 2015-10-21 DIAGNOSIS — Z96652 Presence of left artificial knee joint: Secondary | ICD-10-CM | POA: Diagnosis present

## 2015-10-21 DIAGNOSIS — F329 Major depressive disorder, single episode, unspecified: Secondary | ICD-10-CM | POA: Diagnosis present

## 2015-10-21 DIAGNOSIS — J9610 Chronic respiratory failure, unspecified whether with hypoxia or hypercapnia: Secondary | ICD-10-CM | POA: Diagnosis present

## 2015-10-21 DIAGNOSIS — Z86711 Personal history of pulmonary embolism: Secondary | ICD-10-CM | POA: Diagnosis not present

## 2015-10-21 DIAGNOSIS — Z952 Presence of prosthetic heart valve: Secondary | ICD-10-CM

## 2015-10-21 DIAGNOSIS — N186 End stage renal disease: Secondary | ICD-10-CM | POA: Diagnosis present

## 2015-10-21 DIAGNOSIS — Z992 Dependence on renal dialysis: Secondary | ICD-10-CM

## 2015-10-21 DIAGNOSIS — Z9981 Dependence on supplemental oxygen: Secondary | ICD-10-CM | POA: Diagnosis not present

## 2015-10-21 DIAGNOSIS — I5032 Chronic diastolic (congestive) heart failure: Secondary | ICD-10-CM | POA: Diagnosis present

## 2015-10-21 DIAGNOSIS — D631 Anemia in chronic kidney disease: Secondary | ICD-10-CM | POA: Diagnosis present

## 2015-10-21 DIAGNOSIS — M7989 Other specified soft tissue disorders: Secondary | ICD-10-CM | POA: Diagnosis present

## 2015-10-21 DIAGNOSIS — J449 Chronic obstructive pulmonary disease, unspecified: Secondary | ICD-10-CM | POA: Diagnosis present

## 2015-10-21 DIAGNOSIS — IMO0001 Reserved for inherently not codable concepts without codable children: Secondary | ICD-10-CM

## 2015-10-21 DIAGNOSIS — L0390 Cellulitis, unspecified: Secondary | ICD-10-CM | POA: Diagnosis present

## 2015-10-21 LAB — CBC WITH DIFFERENTIAL/PLATELET
BASOS ABS: 0.1 10*3/uL (ref 0–0.1)
Basophils Relative: 1 %
Eosinophils Absolute: 0.1 10*3/uL (ref 0–0.7)
Eosinophils Relative: 2 %
HCT: 34.4 % — ABNORMAL LOW (ref 35.0–47.0)
HEMOGLOBIN: 11.4 g/dL — AB (ref 12.0–16.0)
LYMPHS ABS: 0.7 10*3/uL — AB (ref 1.0–3.6)
LYMPHS PCT: 10 %
MCH: 33.4 pg (ref 26.0–34.0)
MCHC: 33.2 g/dL (ref 32.0–36.0)
MCV: 100.6 fL — ABNORMAL HIGH (ref 80.0–100.0)
Monocytes Absolute: 0.6 10*3/uL (ref 0.2–0.9)
Monocytes Relative: 9 %
NEUTROS ABS: 5.7 10*3/uL (ref 1.4–6.5)
NEUTROS PCT: 78 %
Platelets: 169 10*3/uL (ref 150–440)
RBC: 3.42 MIL/uL — AB (ref 3.80–5.20)
RDW: 16.5 % — ABNORMAL HIGH (ref 11.5–14.5)
WBC: 7.3 10*3/uL (ref 3.6–11.0)

## 2015-10-21 LAB — BASIC METABOLIC PANEL
ANION GAP: 12 (ref 5–15)
BUN: 21 mg/dL — ABNORMAL HIGH (ref 6–20)
CHLORIDE: 97 mmol/L — AB (ref 101–111)
CO2: 30 mmol/L (ref 22–32)
Calcium: 9.2 mg/dL (ref 8.9–10.3)
Creatinine, Ser: 4.89 mg/dL — ABNORMAL HIGH (ref 0.44–1.00)
GFR calc non Af Amer: 8 mL/min — ABNORMAL LOW (ref >60)
GFR, EST AFRICAN AMERICAN: 10 mL/min — AB (ref >60)
GLUCOSE: 98 mg/dL (ref 65–99)
POTASSIUM: 4.2 mmol/L (ref 3.5–5.1)
Sodium: 139 mmol/L (ref 135–145)

## 2015-10-21 LAB — LACTIC ACID, PLASMA: Lactic Acid, Venous: 1.2 mmol/L (ref 0.5–1.9)

## 2015-10-21 MED ORDER — CLINDAMYCIN PHOSPHATE 600 MG/50ML IV SOLN
600.0000 mg | Freq: Once | INTRAVENOUS | Status: AC
  Administered 2015-10-21: 600 mg via INTRAVENOUS
  Filled 2015-10-21: qty 50

## 2015-10-21 MED ORDER — SODIUM CHLORIDE 0.9 % IV SOLN: 250.0000 mL | INTRAVENOUS | Status: DC | PRN

## 2015-10-21 MED ORDER — CALCIUM ACETATE (PHOS BINDER) 667 MG PO CAPS
2001.0000 mg | ORAL_CAPSULE | Freq: Three times a day (TID) | ORAL | Status: DC
  Administered 2015-10-22 – 2015-10-25 (×7): 2001 mg via ORAL
  Filled 2015-10-21 (×7): qty 3

## 2015-10-21 MED ORDER — DOCUSATE SODIUM 100 MG PO CAPS
100.0000 mg | ORAL_CAPSULE | Freq: Two times a day (BID) | ORAL | Status: DC | PRN
  Administered 2015-10-25: 100 mg via ORAL
  Filled 2015-10-21: qty 1

## 2015-10-21 MED ORDER — TRAZODONE HCL 50 MG PO TABS
50.0000 mg | ORAL_TABLET | Freq: Every day | ORAL | Status: DC
  Administered 2015-10-21 – 2015-10-24 (×4): 50 mg via ORAL
  Filled 2015-10-21 (×4): qty 1

## 2015-10-21 MED ORDER — MORPHINE SULFATE (PF) 2 MG/ML IV SOLN
2.0000 mg | Freq: Once | INTRAVENOUS | Status: AC
  Administered 2015-10-21: 2 mg via INTRAVENOUS
  Filled 2015-10-21: qty 1

## 2015-10-21 MED ORDER — ACETAMINOPHEN 500 MG PO TABS
1000.0000 mg | ORAL_TABLET | Freq: Four times a day (QID) | ORAL | Status: DC | PRN
Start: 2015-10-21 — End: 2015-10-25
  Administered 2015-10-22: 1000 mg via ORAL
  Filled 2015-10-21: qty 2

## 2015-10-21 MED ORDER — ONDANSETRON HCL 4 MG/2ML IJ SOLN
4.0000 mg | Freq: Once | INTRAMUSCULAR | Status: AC
  Administered 2015-10-21: 4 mg via INTRAVENOUS
  Filled 2015-10-21: qty 2

## 2015-10-21 MED ORDER — CEFAZOLIN IN D5W 1 GM/50ML IV SOLN
1.0000 g | Freq: Three times a day (TID) | INTRAVENOUS | Status: DC
  Administered 2015-10-21 – 2015-10-24 (×7): 1 g via INTRAVENOUS
  Filled 2015-10-21 (×11): qty 50

## 2015-10-21 MED ORDER — CYANOCOBALAMIN 1000 MCG/ML IJ SOLN: 1000.0000 ug | INTRAMUSCULAR | Status: DC

## 2015-10-21 MED ORDER — CETYLPYRIDINIUM CHLORIDE 0.05 % MT LIQD
7.0000 mL | Freq: Two times a day (BID) | OROMUCOSAL | Status: DC
  Administered 2015-10-21 – 2015-10-23 (×5): 7 mL via OROMUCOSAL

## 2015-10-21 MED ORDER — SODIUM CHLORIDE 0.9% FLUSH: 3.0000 mL | INTRAVENOUS | Status: DC | PRN

## 2015-10-21 MED ORDER — OXYCODONE HCL 5 MG PO TABS
5.0000 mg | ORAL_TABLET | ORAL | Status: DC | PRN
  Administered 2015-10-22 – 2015-10-24 (×5): 5 mg via ORAL
  Filled 2015-10-21 (×5): qty 1

## 2015-10-21 MED ORDER — PRAVASTATIN SODIUM 10 MG PO TABS
10.0000 mg | ORAL_TABLET | Freq: Every day | ORAL | Status: DC
  Administered 2015-10-22: 10 mg via ORAL
  Filled 2015-10-21: qty 1

## 2015-10-21 MED ORDER — ADULT MULTIVITAMIN W/MINERALS CH
1.0000 | ORAL_TABLET | Freq: Every day | ORAL | Status: DC
  Administered 2015-10-22 – 2015-10-25 (×2): 1 via ORAL
  Filled 2015-10-21 (×2): qty 1

## 2015-10-21 MED ORDER — CLOPIDOGREL BISULFATE 75 MG PO TABS
75.0000 mg | ORAL_TABLET | Freq: Every day | ORAL | Status: DC
  Administered 2015-10-22: 75 mg via ORAL
  Filled 2015-10-21: qty 1

## 2015-10-21 MED ORDER — ALPRAZOLAM 1 MG PO TABS
1.0000 mg | ORAL_TABLET | Freq: Three times a day (TID) | ORAL | Status: DC | PRN
  Administered 2015-10-22 – 2015-10-24 (×3): 1 mg via ORAL
  Filled 2015-10-21 (×3): qty 1

## 2015-10-21 MED ORDER — ASPIRIN EC 325 MG PO TBEC
325.0000 mg | DELAYED_RELEASE_TABLET | Freq: Every day | ORAL | Status: DC
  Administered 2015-10-22: 325 mg via ORAL
  Filled 2015-10-21: qty 1

## 2015-10-21 MED ORDER — CALCIUM ACETATE 667 MG PO CAPS: 667.0000 mg | ORAL_CAPSULE | Freq: Three times a day (TID) | ORAL | Status: DC

## 2015-10-21 MED ORDER — UMECLIDINIUM-VILANTEROL 62.5-25 MCG/INH IN AEPB
1.0000 | INHALATION_SPRAY | Freq: Every day | RESPIRATORY_TRACT | Status: DC
  Administered 2015-10-22 – 2015-10-25 (×3): 1 via RESPIRATORY_TRACT
  Filled 2015-10-21: qty 14

## 2015-10-21 MED ORDER — PANTOPRAZOLE SODIUM 40 MG PO TBEC: 40.0000 mg | DELAYED_RELEASE_TABLET | Freq: Every day | ORAL | Status: DC | PRN

## 2015-10-21 MED ORDER — DONEPEZIL HCL 5 MG PO TABS
5.0000 mg | ORAL_TABLET | Freq: Every day | ORAL | Status: DC
  Administered 2015-10-21 – 2015-10-24 (×4): 5 mg via ORAL
  Filled 2015-10-21 (×4): qty 1

## 2015-10-21 MED ORDER — FUROSEMIDE 40 MG PO TABS
20.0000 mg | ORAL_TABLET | Freq: Every day | ORAL | Status: DC
  Administered 2015-10-22: 20 mg via ORAL
  Filled 2015-10-21 (×2): qty 1

## 2015-10-21 MED ORDER — BACLOFEN 10 MG PO TABS
5.0000 mg | ORAL_TABLET | Freq: Two times a day (BID) | ORAL | Status: DC | PRN
  Filled 2015-10-21: qty 1

## 2015-10-21 MED ORDER — CALCITRIOL 0.25 MCG PO CAPS
0.2500 ug | ORAL_CAPSULE | Freq: Every day | ORAL | Status: DC
  Administered 2015-10-22: 0.25 ug via ORAL
  Filled 2015-10-21: qty 1

## 2015-10-21 MED ORDER — SODIUM CHLORIDE 0.9% FLUSH
3.0000 mL | Freq: Two times a day (BID) | INTRAVENOUS | Status: DC
  Administered 2015-10-21 – 2015-10-23 (×4): 3 mL via INTRAVENOUS

## 2015-10-21 MED ORDER — ALBUTEROL SULFATE (2.5 MG/3ML) 0.083% IN NEBU: 3.0000 mL | INHALATION_SOLUTION | RESPIRATORY_TRACT | Status: DC | PRN

## 2015-10-21 NOTE — ED Notes (Signed)
Report given to [facility], Architect. Pt taken to floor via stretcher. Vital signs stable prior to transport.

## 2015-10-21 NOTE — ED Notes (Signed)
Patient presents to the ED with left leg swelling, redness, and pain.  Patient was seen in ED several days ago and was told she had a superficial blood clot in her leg and was referred to vascular.  Patient states leg seems much worse.  Patient also reports a new bruise behind her right knee.  Patient is a M/W/F dialysis patient and has already had dialysis today.  Patient is on 3L of oxygen all the time.  Patient has history of blood clots and takes plavix.  Patient is alert and oriented x 3 at this time.  Speaking in full sentences.

## 2015-10-21 NOTE — ED Notes (Signed)
Patient given meal tray and fluids.

## 2015-10-21 NOTE — ED Notes (Signed)
MD at bedside. 

## 2015-10-21 NOTE — ED Provider Notes (Signed)
[facility] Emergency Department Provider Note   ____________________________________________  Time seen: Approximately 4pm I have reviewed the triage vital signs and the triage nursing note.  HISTORY  Chief Complaint Leg Pain   Historian Patient and daughter  HPI Ariel Smith is a 70 y.o. female presenting for evaluation of worsening left lower show any swelling, redness, and pain. She was seen in the emergency department by myself on 10/11/15 for similar symptoms but things have gotten worse.   At that point in time she had a palpable cord in her left lower extremity consistent with superficial thrombophlebitis. She had an ultrasound showing chronic DVT which is unchanged from prior and cannot be on Coumadin due to a history of GI bleeding and has currently an IVC filter.  She did make an appointment to see a vascular surgeon, Dr. dew, for this Thursday, but over the last several days she's had increasing pain and swelling and red rash to the left lower extremity extending from the initial site of the thrombophlebitis laterally proximally and distally.  Denies fever or nausea.Denies dizziness or passing out.  Pain is moderate. Worse with movement.    Past Medical History  Diagnosis Date  . Osteoarthritis   . Depression   . Frequent headaches   . HTN (hypertension)   . HLD (hyperlipidemia)   . History of colon polyps 2013    colonoscopy ([name])  . 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 GERD (gastroesophageal reflux disease)   . COPD (chronic obstructive pulmonary disease) (HCC)   . ESRD (end stage renal disease) (HCC) 08/2011    a. on HD (TThSa), L forearm AV fistula, [name]  . Secondary hyperparathyroidism of renal origin (HCC)   . Osteopenia 01/2013  . Bronchiectasis (HCC) 08/2014     suggested by thoracic xray  . Anemia of chronic disease   .  Severe aortic stenosis     a. echo 10/2014: EF 60-65%, no RWMA, GR1DD, mod to sev AS (peak vel 377 cm/s, mean gradient 34 mm Hg, peak gradient 57 mm Hg, valve area (VTA) 0.72 cm^2  . Chronic respiratory failure (HCC)     a. 2/2 COPD; b. on 4-5L via nasal cannula  . Morbid obesity (HCC)   . GIB (gastrointestinal bleeding)     a. leading to cessation of warfarin 11/2014  . Chronic diastolic CHF (congestive heart failure) (HCC)     a. echo 10/2014: EF 60-65%, no RWMA, GR1DD, severe AS, mild MR, PASP 46 mm Hg  . Pulmonary embolism (HCC) 2009  . Renal failure     Patient has been on dialysis for " four years" -per patient.  Marland Kitchen Heart murmur   . 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 Fall from bed 05/17/2015  . Insomnia 04/08/2015  . 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 Chronic respiratory failure (HCC)   . Anemia of chronic disease   . Chronic diastolic CHF (congestive heart failure) (HCC)   . Obesity, Class II, BMI 35-39.9, with comorbidity (HCC) 01/07/2015  . COPD (chronic obstructive pulmonary disease) (HCC) 12/20/2014  . DVT (deep venous thrombosis) (HCC) 11/16/2014  . Aortic valvar stenosis 11/16/2014  . Vitamin D  deficiency 11/03/2014  . Memory deficit 11/01/2014  . Right-sided thoracic back pain 08/17/2014  . Advanced care planning/counseling discussion 03/30/2014  . Health maintenance examination 03/30/2014  . Vitamin B12 deficiency 01/27/2014  . Medicare annual wellness visit, subsequent 01/27/2013  . Osteopenia 01/18/2013  . Chronic cough 10/22/2012  . Osteoarthritis   . MDD (major depressive disorder), recurrent episode, moderate (HCC)   . HTN (hypertension)   . HLD (hyperlipidemia)   . GERD (gastroesophageal reflux disease)   . ESRD (end stage renal disease) on  dialysis (HCC) 08/19/2011    Past Surgical History  Procedure Laterality Date  . Cholecystectomy  2012  . Bunionectomy Left 2003  . Replacement total knee Left 2006  . Shoulder arthroscopy Right 2009  . Colonoscopy  08/2011    colon biopsies, [name]  . Esophagogastroduodenoscopy  08/2011    gastric cardia polyp  . Tonsillectomy  1955  . Tubal ligation  1980  . Exteriorization of a continuous ambulatory peritoneal dialysis catheter  01/2013    removal 12/2103 - [name]  . Hospitalization  12/2013    recurrent R pleural effusion due to peritoneal fluid translocation s/p rpt thoracentesis with 1.3 L fluid removed, ERSD started on HD this hospitalization  . US echocardiography  12/2013    EF 55-60%, nl LV sys fxn, mild-mod MR, AS, increased LV posterior wall thickness, mild TR  . Esophagogastroduodenoscopy Left 11/11/2014    Procedure: ESOPHAGOGASTRODUODENOSCOPY (EGD);  Surgeon: [name], MD;  Location: [facility] ENDOSCOPY;  Service: Endoscopy;  Laterality: Left;  . Peripheral vascular catheterization N/A 11/12/2014    Procedure: IVC Filter Insertion;  Surgeon: [name], MD;  Location: [facility] INVASIVE CV LAB;  Service: Cardiovascular;  Laterality: N/A;  . Cardiac catheterization N/A 01/18/2015    patent coronary arteries without significant osbtruction and preserved LV function, severe aortic stenosis; Procedure: Right/Left Heart Cath and Coronary Angiography;  Surgeon: Tonny Bollman, MD  . Transcatheter aortic valve replacement, transfemoral Right 02/19/2015    Procedure: TRANSCATHETER AORTIC VALVE REPLACEMENT, TRANSFEMORAL;  Surgeon: Tonny Bollman, MD;  Location: [facility] OR;  Service: Open Heart Surgery;  Laterality: Right;  . Tee without cardioversion N/A 02/19/2015    Procedure: TRANSESOPHAGEAL ECHOCARDIOGRAM (TEE);  Surgeon: Tonny Bollman, MD;  Location: [facility] OR;  Service: Open Heart Surgery;  Laterality: N/A;    Current Outpatient Rx  Name  Route  Sig  Dispense  Refill  . acetaminophen  (TYLENOL) 500 MG tablet   Oral   Take 1,000 mg by mouth every 6 (six) hours as needed for mild pain or headache.          . albuterol (VENTOLIN HFA) 108 (90 BASE) MCG/ACT inhaler   Inhalation   Inhale 2 puffs into the lungs every 4 (four) hours as needed for wheezing or shortness of breath.   18 g   3   . ALPRAZolam (XANAX) 1 MG tablet   Oral   Take 1 mg by mouth 3 (three) times daily as needed for anxiety.         Marland Kitchen aspirin EC 325 MG EC tablet   Oral   Take 1 tablet (325 mg total) by mouth daily.   30 tablet   0   . baclofen (LIORESAL) 10 MG tablet   Oral   Take 0.5-1 tablets (5-10 mg total) by mouth 2 (two) times daily as needed for muscle spasms.   30 each   0   . calcitRIOL (ROCALTROL) 0.25 MCG capsule   Oral  Take 0.25 mcg by mouth daily.         . calcium acetate (PHOSLO) 667 MG capsule   Oral   Take 667-2,001 mg by mouth 3 (three) times daily with meals. Take 3 with meals and 1 with a snack         . clopidogrel (PLAVIX) 75 MG tablet   Oral   Take 75 mg by mouth daily.         . cyanocobalamin (,VITAMIN B-12,) 1000 MCG/ML injection   Intramuscular   Inject 1,000 mcg into the muscle every 30 (thirty) days.         Marland Kitchen docusate sodium (COLACE) 100 MG capsule   Oral   Take 100 mg by mouth 2 (two) times daily as needed for mild constipation.         Marland Kitchen donepezil (ARICEPT) 5 MG tablet   Oral   Take 5 mg by mouth at bedtime.         . furosemide (LASIX) 20 MG tablet   Oral   Take 1 tablet (20 mg total) by mouth daily.   30 tablet   6   . lidocaine-prilocaine (EMLA) cream   Topical   Apply 1 application topically as needed (topical anesthesia for hemodialysis if Gebauers and Lidocaine injection are ineffective.).   30 g   0   . lovastatin (MEVACOR) 20 MG tablet   Oral   Take 20 mg by mouth at bedtime.         . Multiple Vitamin (MULTIVITAMIN WITH MINERALS) TABS tablet   Oral   Take 1 tablet by mouth daily.         . pantoprazole  (PROTONIX) 40 MG tablet   Oral   Take 1 tablet (40 mg total) by mouth daily as needed (for heartburn/indigestion).   90 tablet   1   . promethazine (PHENERGAN) 12.5 MG tablet   Oral   Take 12.5 mg by mouth every 6 (six) hours as needed for nausea or vomiting.         . traZODone (DESYREL) 50 MG tablet   Oral   Take 50 mg by mouth at bedtime.       3   . umeclidinium-vilanterol (ANORO ELLIPTA) 62.5-25 MCG/INH AEPB   Inhalation   Inhale 1 puff into the lungs daily.   60 each   5     Allergies Nodolor  Family History  Problem Relation Age of Onset  . Deep vein thrombosis Brother   . Cancer Mother 17    colon  . Stroke Mother   . Cancer Father     prostate  . Hypertension Father   . Dementia Father 54  . Hypertension Brother   . Diabetes Neg Hx   . CAD Neg Hx   . Cancer Daughter 85    breast    Social History Social History  Substance Use Topics  . Smoking status: Former Smoker -- 2.00 packs/day for 30 years    Types: Cigarettes    Start date: 04/20/1978    Quit date: 04/21/2007  . Smokeless tobacco: Never Used  . Alcohol Use: No    Review of Systems  Constitutional: Negative for fever. Eyes: Negative for visual changes. ENT: Negative for sore throat. Cardiovascular: Negative for chest pain. Respiratory: Negative for shortness of breath. Gastrointestinal: Negative for abdominal pain, vomiting and diarrhea. Genitourinary: Negative for dysuria. Musculoskeletal: Negative for back pain. Skin: Cellulitic rash as per history of present illness. Neurological: Negative for  headache. 10 point Review of Systems otherwise negative ____________________________________________   PHYSICAL EXAM:  VITAL SIGNS: ED Triage Vitals  Enc Vitals Group     BP 10/21/15 1538 85/71 mmHg     Pulse Rate 10/21/15 1538 75     Resp 10/21/15 1538 24     Temp 10/21/15 1538 98.4 F (36.9 C)     Temp Source 10/21/15 1538 Oral     SpO2 10/21/15 1538 100 %     Weight  10/21/15 1538 215 lb (97.523 kg)     Height 10/21/15 1538 5\' 4"  (1.626 m)     Head Cir --      Peak Flow --      Pain Score 10/21/15 1539 4     Pain Loc --      Pain Edu? --      Excl. in GC? --      Constitutional: Alert and oriented. Well appearing and in no distress. HEENT   Head: Normocephalic and atraumatic.      Eyes: Conjunctivae are normal. PERRL. Normal extraocular movements.      Ears:         Nose: No congestion/rhinnorhea.   Mouth/Throat: Mucous membranes are moist.   Neck: No stridor. Cardiovascular/Chest: Normal rate, regular rhythm.  No murmurs, rubs, or gallops. Respiratory: Normal respiratory effort without tachypnea nor retractions. Breath sounds are clear and equal bilaterally. No wheezes/rales/rhonchi. Gastrointestinal: Soft. No distention, no guarding, no rebound. Nontender.  Obese  Genitourinary/rectal:Deferred Musculoskeletal: Bilateral lower extremity edema, right lower extremity 1+, left lower extremity 3+. Tenderness medially to the calf and thigh where the skin is erythematous. No drainage or abscess palpable. There is a tender cord persisting at the medial lower thigh.. Neurologic:  Normal speech and language. No gross or focal neurologic deficits are appreciated. Skin:  Skin is warm, dry and intact. No rash noted. Psychiatric: Mood and affect are normal. Speech and behavior are normal. Patient exhibits appropriate insight and judgment.  ____________________________________________   EKG I, Governor Rooks, MD, the attending physician have personally viewed and interpreted all ECGs.  74 bpm. Normal sinus rhythm. Narrow QRS. Normal axis. Normal ST and T-wave ____________________________________________  LABS (pertinent positives/negatives)  Labs Reviewed  BASIC METABOLIC PANEL - Abnormal; Notable for the following:    Chloride 97 (*)    BUN 21 (*)    Creatinine, Ser 4.89 (*)    GFR calc non Af Amer 8 (*)    GFR calc Af Amer 10 (*)    All  other components within normal limits  CBC WITH DIFFERENTIAL/PLATELET - Abnormal; Notable for the following:    RBC 3.42 (*)    Hemoglobin 11.4 (*)    HCT 34.4 (*)    MCV 100.6 (*)    RDW 16.5 (*)    Lymphs Abs 0.7 (*)    All other components within normal limits  CULTURE, BLOOD (ROUTINE X 2)  CULTURE, BLOOD (ROUTINE X 2)  LACTIC ACID, PLASMA  LACTIC ACID, PLASMA    ____________________________________________  RADIOLOGY All Xrays were viewed by me. Imaging interpreted by Radiologist.  Ultrasound left lower extremity:  FINDINGS: Contralateral Common Femoral Vein: Respiratory phasicity is normal and symmetric with the symptomatic side. No evidence of thrombus. Normal compressibility.  Common Femoral Vein: Nonocclusive thrombus noted.  Saphenofemoral Junction: Occlusive thrombus identified with decreased compressibility.  Profunda Femoral Vein: Occlusive thrombus identified with decreased compressibility.  Femoral Vein: Occlusive thrombus identified with decreased compressibility.  Popliteal Vein: Occlusive thrombus identified with decreased compressibility.  Calf Veins: Not well visualized  Superficial Great Saphenous Vein: The greater saphenous vein from junction to upper calf is also noted.  Venous Reflux: None. __________________________________________  PROCEDURES  Procedure(s) performed: None  Critical Care performed: None  ____________________________________________   ED COURSE / ASSESSMENT AND PLAN  Pertinent labs & imaging results that were available during my care of the patient were reviewed by me and considered in my medical decision making (see chart for details).   I saw this patient last week, and the redness and swelling is certainly much worse and at this point consistent with a left A cellulitis. She does still have some tenderness over the palpable cord which tracks along the medial inner thigh consistent with superficial  thrombophlebitis.  Triage nurse noted that her initial blood pressure was systolic in the 80s, recheck her blood pressure was 103. Patient denies dizziness, fevers or chills at home. However given the evidence of clinical infection, I will check a lactate and blood cultures.  If her blood cell count is elevated I may initiate code sepsis or if she has additional episodes of hypotension.   White blood count is not elevated. Lactate is normal. I am not concerned about sepsis at this point time.  The ultrasound performed today does not come in on whether or not the thrombus is worse than previous, but the way that it is red and it appears to me that the common femoral vein previously did not have thrombus and now is red as nonocclusive thrombus, and the profunda femoral vein also was previously reported as none and is now showing occlusive thrombus, and so there is some extent or worsening of the left lower extremity DVT. It may be that this worsening DVT is giving her some additional swelling as well.  In either case, the redness appears to be consistent with cellulitis and I think it is worth observation, given the extent of the left lower extremities a cellulitis/redness, in the hospital. She was given clindamycin.    CONSULTATIONS:   Hospitalist for admission.  Patient / Family / Caregiver informed of clinical course, medical decision-making process, and agree with plan.    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 Any transcriptional errors that result from this process are unintentional   Governor Rooks, MD 10/21/15 [phone]

## 2015-10-21 NOTE — ED Notes (Signed)
Called floor and spoke to [place] to see why bed had not been approved yet. Jola Babinski said that she would speak to [facility].

## 2015-10-21 NOTE — ED Notes (Signed)
Admitting MD at bedside.

## 2015-10-21 NOTE — H&P (Signed)
Ariel Smith is an 70 y.o. female.   Chief Complaint: Left leg swelling HPI: Complains of 2 week history of what started as left ankle swelling which has progressed to leg swelling . Seen in the Ed a few days ago and US showed her chronic left lower ext DVT was stable but she had new superficial DVT. She was to f/u with vascular surgery. Since then it has worsened.  Past Medical History  Diagnosis Date  . Osteoarthritis   . Depression   . Frequent headaches   . HTN (hypertension)   . HLD (hyperlipidemia)   . History of colon polyps 2013    colonoscopy (Dr. Bary Castilla)  . 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 GERD (gastroesophageal reflux disease)   . COPD (chronic obstructive pulmonary disease) ([place])   . ESRD (end stage renal disease) ([place]) 08/2011    a. on HD (TThSa), L forearm AV fistula, Dr. Candiss Norse  . Secondary hyperparathyroidism of renal origin ([place])   . Osteopenia 01/2013  . Bronchiectasis ([place]) 08/2014     suggested by thoracic xray  . Anemia of chronic disease   . Severe aortic stenosis     a. echo 10/2014: EF 60-65%, no RWMA, GR1DD, mod to sev AS (peak vel 377 cm/s, mean gradient 34 mm Hg, peak gradient 57 mm Hg, valve area (VTA) 0.72 cm^2  . Chronic respiratory failure (HCC)     a. 2/2 COPD; b. on 4-5L via nasal cannula  . Morbid obesity ([place])   . GIB (gastrointestinal bleeding)     a. leading to cessation of warfarin 11/2014  . Chronic diastolic CHF (congestive heart failure) ([place])     a. echo 10/2014: EF 60-65%, no RWMA, GR1DD, severe AS, mild MR, PASP 46 mm Hg  . Pulmonary embolism ([place]) 2009  . Renal failure     Patient has been on dialysis for " four years" -per patient.  Marland Kitchen Heart murmur   . 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 Cholecystectomy  2012  . Bunionectomy Left 2003  . Replacement total knee Left 2006  . Shoulder arthroscopy Right 2009  . Colonoscopy  08/2011    colon biopsies, Dr. Fleet Contras  . Esophagogastroduodenoscopy  08/2011    gastric cardia polyp  . Tonsillectomy  1955  . Tubal ligation  1980  . Exteriorization of a continuous ambulatory peritoneal dialysis catheter  01/2013    removal 12/2103 - Dr. Lucky Cowboy  . Hospitalization  12/2013    recurrent R pleural effusion due to peritoneal fluid translocation s/p rpt thoracentesis with 1.3 L fluid removed, ERSD started on HD this hospitalization  . US echocardiography  12/2013    EF 55-60%, nl LV sys fxn, mild-mod MR, AS, increased LV posterior wall thickness, mild TR  . Esophagogastroduodenoscopy Left 11/11/2014    Procedure: ESOPHAGOGASTRODUODENOSCOPY (EGD);  Surgeon: Hulen Luster, MD;  Location: [facility] ENDOSCOPY;  Service: Endoscopy;  Laterality: Left;  . Peripheral vascular catheterization N/A 11/12/2014    Procedure: IVC Filter Insertion;  Surgeon: Algernon Huxley, MD;  Location: [place] CV LAB;  Service: Cardiovascular;  Laterality: N/A;  . Cardiac catheterization N/A 01/18/2015    patent coronary arteries without significant osbtruction and preserved LV function,  severe aortic stenosis; Procedure: Right/Left Heart Cath and Coronary Angiography;  Surgeon: Sherren Mocha, MD  . Transcatheter aortic valve replacement, transfemoral Right 02/19/2015    Procedure: TRANSCATHETER AORTIC VALVE REPLACEMENT, TRANSFEMORAL;  Surgeon: Sherren Mocha, MD;  Location: [place];  Service: Open Heart Surgery;  Laterality: Right;  . Tee without cardioversion N/A 02/19/2015    Procedure: TRANSESOPHAGEAL ECHOCARDIOGRAM (TEE);  Surgeon: Sherren Mocha, MD;  Location: [place];  Service: Open Heart Surgery;  Laterality: N/A;    Family History  Problem Relation Age of Onset  . Deep vein thrombosis Brother   . Cancer Mother 24    colon  . Stroke Mother   .  Cancer Father     prostate  . Hypertension Father   . Dementia Father 68  . Hypertension Brother   . Diabetes Neg Hx   . CAD Neg Hx   . Cancer Daughter 27    breast   Social History:  reports that she quit smoking about 8 years ago. Her smoking use included Cigarettes. She started smoking about 37 years ago. She has a 60 pack-year smoking history. She has never used smokeless tobacco. She reports that she does not drink alcohol or use illicit drugs.  Allergies:  Allergies  Allergen Reactions  . Nodolor [Isometheptene-Dichloral-Apap] Other (See Comments)    Reaction:  Headaches      (Not in a hospital admission)  Results for orders placed or performed during the hospital encounter of 10/21/15 (from the past 48 hour(s))  Basic metabolic panel     Status: Abnormal   Collection Time: 10/21/15  4:11 PM  Result Value Ref Range   Sodium 139 135 - 145 mmol/L   Potassium 4.2 3.5 - 5.1 mmol/L   Chloride 97 (L) 101 - 111 mmol/L   CO2 30 22 - 32 mmol/L   Glucose, Bld 98 65 - 99 mg/dL   BUN 21 (H) 6 - 20 mg/dL   Creatinine, Ser 4.89 (H) 0.44 - 1.00 mg/dL   Calcium 9.2 8.9 - 10.3 mg/dL   GFR calc non Af Amer 8 (L) >60 mL/min   GFR calc Af Amer 10 (L) >60 mL/min    Comment: (NOTE) The eGFR has been calculated using the CKD EPI equation. This calculation has not been validated in all clinical situations. eGFR's persistently <60 mL/min signify possible Chronic Kidney Disease.    Anion gap 12 5 - 15  CBC with Differential     Status: Abnormal   Collection Time: 10/21/15  4:11 PM  Result Value Ref Range   WBC 7.3 3.6 - 11.0 K/uL   RBC 3.42 (L) 3.80 - 5.20 MIL/uL   Hemoglobin 11.4 (L) 12.0 - 16.0 g/dL   HCT 34.4 (L) 35.0 - 47.0 %   MCV 100.6 (H) 80.0 - 100.0 fL   MCH 33.4 26.0 - 34.0 pg   MCHC 33.2 32.0 - 36.0 g/dL   RDW 16.5 (H) 11.5 - 14.5 %   Platelets 169 150 - 440 K/uL   Neutrophils Relative % 78 %   Neutro Abs 5.7 1.4 - 6.5 K/uL   Lymphocytes Relative 10 %   Lymphs Abs 0.7  (L) 1.0 - 3.6 K/uL   Monocytes Relative 9 %   Monocytes Absolute 0.6 0.2 - 0.9 K/uL   Eosinophils Relative 2 %   Eosinophils Absolute 0.1 0 - 0.7 K/uL   Basophils Relative 1 %   Basophils Absolute 0.1 0 - 0.1 K/uL  Lactic acid, plasma  Status: None   Collection Time: 10/21/15  4:11 PM  Result Value Ref Range   Lactic Acid, Venous 1.2 0.5 - 1.9 mmol/L   US Venous Img Lower Unilateral Left  10/21/2015  CLINICAL DATA:  Leg pain and swelling EXAM: Left LOWER EXTREMITY VENOUS DOPPLER ULTRASOUND TECHNIQUE: Gray-scale sonography with graded compression, as well as color Doppler and duplex ultrasound were performed to evaluate the lower extremity deep venous systems from the level of the common femoral vein and including the common femoral, femoral, profunda femoral, popliteal and calf veins including the posterior tibial, peroneal and gastrocnemius veins when visible. The superficial great saphenous vein was also interrogated. Spectral Doppler was utilized to evaluate flow at rest and with distal augmentation maneuvers in the common femoral, femoral and popliteal veins. COMPARISON:  None. FINDINGS: Contralateral Common Femoral Vein: Respiratory phasicity is normal and symmetric with the symptomatic side. No evidence of thrombus. Normal compressibility. Common Femoral Vein: Nonocclusive thrombus noted. Saphenofemoral Junction: Occlusive thrombus identified with decreased compressibility. Profunda Femoral Vein: Occlusive thrombus identified with decreased compressibility. Femoral Vein: Occlusive thrombus identified with decreased compressibility. Popliteal Vein: Occlusive thrombus identified with decreased compressibility. Calf Veins: Not well visualized Superficial Great Saphenous Vein: The greater saphenous vein from junction to upper calf is also noted. Venous Reflux:  None. Other Findings: IMPRESSION: 1. Examination is positive for deep vein thrombosis within the left lower extremity These results will be  called to the ordering clinician or representative by the Radiologist Assistant, and communication documented in the PACS or zVision Dashboard. Electronically Signed   By: Kerby Moors M.D.   On: 10/21/2015 17:49    Review of Systems  Constitutional: Negative for fever and chills.  HENT: Negative for hearing loss.   Eyes: Negative for blurred vision.  Respiratory: Negative for shortness of breath.   Cardiovascular: Negative for chest pain.  Gastrointestinal: Negative for nausea and vomiting.  Genitourinary: Negative for dysuria.  Musculoskeletal: Positive for joint pain.  Skin: Negative for rash.  Neurological: Negative for sensory change.    Blood pressure 94/79, pulse 78, temperature 98.4 F (36.9 C), temperature source Oral, resp. rate 21, height 5' 4"  (1.626 m), weight 97.523 kg (215 lb), SpO2 96 %. Physical Exam  Constitutional: She is oriented to person, place, and time.  Obese female in no acute distress  HENT:  Head: Normocephalic and atraumatic.  Mouth/Throat: Oropharynx is clear and moist. No oropharyngeal exudate.  Eyes: EOM are normal. Pupils are equal, round, and reactive to light. No scleral icterus.  Neck: Neck supple. No tracheal deviation present. No thyromegaly present.  Cardiovascular: Normal rate and regular rhythm.   No murmur heard. Respiratory: Effort normal and breath sounds normal. No respiratory distress. She exhibits no tenderness.  GI: Soft. Bowel sounds are normal. She exhibits no distension and no mass.  Musculoskeletal: She exhibits edema.  Left leg swollen. Superficial veins palpable in popliteal and femoral region.  Erythematous in the femoral region and warm to touch.  Lymphadenopathy:    She has no cervical adenopathy.  Neurological: She is alert and oriented to person, place, and time. No cranial nerve deficit.  Skin: Skin is warm and dry. No erythema.     Assessment/Plan 1. Cellulitis Left Lower Ext: Start abx. This may be all related to  the DVTs but difficult to tell so will start abx and observe for improvement.   2. 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 Cannot tolerate anticoagulants  because of GI bleeding in the past. However, she is tolerating ASA and plavix. New superficial clots are causing a lot of pain and swelling. Patient would probably benefit from anticoagulation if she can tolerate. Will discuss possibility with vascular surgery.  3. ESRD: On HD.  4. COPD: Continue current meds.  Time spent= 55mn  JBaxter Hire MD 10/21/2015, 8:02 PM

## 2015-10-22 ENCOUNTER — Encounter: Payer: Self-pay | Admitting: Family Medicine

## 2015-10-22 DIAGNOSIS — I82502 Chronic embolism and thrombosis of unspecified deep veins of left lower extremity: Secondary | ICD-10-CM

## 2015-10-22 DIAGNOSIS — IMO0001 Reserved for inherently not codable concepts without codable children: Secondary | ICD-10-CM | POA: Insufficient documentation

## 2015-10-22 LAB — MRSA PCR SCREENING: MRSA by PCR: NEGATIVE

## 2015-10-22 NOTE — Care Management (Signed)
Patient present with swelling in left leg.  She has history of chronic DVT with IVC filter.  Seen in ED on 6/23  for swelling of leg and referred to vascular with appointment for 7/6.  Symptoms worsened and presented back to ED and admitted.   Patient has been on Plavix and aspirin.   Anticoagulation regimens have been limited in the past due to history of significant gi bleed.  Vascular consult is pending.  Patient esrd with chronic dialysis [facility].  Notified dialysis coordinator of admission

## 2015-10-22 NOTE — Consult Note (Signed)
Consult Note  Patient name: Ariel Smith MRN: [id number] DOB: 1945-05-03 Sex: female  Consulting Physician:  Dr. Allena Katz  Reason for Consult:  Chief Complaint  Patient presents with  . Leg Pain    HISTORY OF PRESENT ILLNESS: This is a 70 year old female with ESRD on HD via a left RCF and history of DVT with IVC filter in place because she could not be anticoagulated for a DVT because of a GI bleed presented to the hospital with left leg pain>  Her u/s shows a new left leg DVT which is occlusive in the femoral segment.  She is on ASA and Plavix for cardiac conditions and has not had any episodes of GI bleeding.  She suffers from COPD secondary to long term tobacco abuse.  She is s/p TAVR.  She is on a statin for hypercholesterolemia.  Past Medical History  Diagnosis Date  . Osteoarthritis   . Depression   . Frequent headaches   . HTN (hypertension)   . HLD (hyperlipidemia)   . History of colon polyps 2013    colonoscopy ([name])  . 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 GERD (gastroesophageal reflux disease)   . COPD (chronic obstructive pulmonary disease) (HCC)   . ESRD (end stage renal disease) (HCC) 08/2011    a. on HD (TThSa), L forearm AV fistula, [name]  . Secondary hyperparathyroidism of renal origin (HCC)   . Osteopenia 01/2013  . Bronchiectasis (HCC) 08/2014     suggested by thoracic xray  . Anemia of chronic disease   . Severe aortic stenosis     a. echo 10/2014: EF 60-65%, no RWMA, GR1DD, mod to sev AS (peak vel 377 cm/s, mean gradient 34 mm Hg, peak gradient 57 mm Hg, valve area (VTA) 0.72 cm^2  . Chronic respiratory failure (HCC)     a. 2/2 COPD; b. on 4-5L via nasal cannula  . Morbid obesity (HCC)   . GIB (gastrointestinal bleeding)     a. leading to cessation of warfarin 11/2014  . Chronic diastolic CHF (congestive heart failure) (HCC)     a. echo 10/2014: EF  60-65%, no RWMA, GR1DD, severe AS, mild MR, PASP 46 mm Hg  . Pulmonary embolism (HCC) 2009  . Renal failure     Patient has been on dialysis for " four years" -per patient.  Marland Kitchen Heart murmur   . 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 Cholecystectomy  2012  . Bunionectomy Left 2003  . Replacement total knee Left 2006  . Shoulder arthroscopy Right 2009  . Colonoscopy  08/2011    colon biopsies, [name]  . Esophagogastroduodenoscopy  08/2011    gastric cardia polyp  . Tonsillectomy  1955  . Tubal ligation  1980  . Exteriorization of a continuous ambulatory peritoneal dialysis catheter  01/2013    removal 12/2103 - [name]  . Hospitalization  12/2013    recurrent R pleural effusion due to peritoneal fluid translocation s/p rpt thoracentesis with 1.3 L fluid removed, ERSD started on HD this hospitalization  . US echocardiography  12/2013    EF 55-60%, nl LV sys fxn, mild-mod MR, AS, increased LV posterior  wall thickness, mild TR  . Esophagogastroduodenoscopy Left 11/11/2014    Procedure: ESOPHAGOGASTRODUODENOSCOPY (EGD);  Surgeon: [name], MD;  Location: [facility] ENDOSCOPY;  Service: Endoscopy;  Laterality: Left;  . Peripheral vascular catheterization N/A 11/12/2014    Procedure: IVC Filter Insertion;  Surgeon: [name], MD;  Location: [facility] INVASIVE CV LAB;  Service: Cardiovascular;  Laterality: N/A;  . Cardiac catheterization N/A 01/18/2015    patent coronary arteries without significant osbtruction and preserved LV function, severe aortic stenosis; Procedure: Right/Left Heart Cath and Coronary Angiography;  Surgeon: Tonny Bollman, MD  . Transcatheter aortic valve replacement, transfemoral Right 02/19/2015    Procedure: TRANSCATHETER AORTIC VALVE REPLACEMENT, TRANSFEMORAL;  Surgeon: Tonny Bollman, MD;  Location:  [facility] OR;  Service: Open Heart Surgery;  Laterality: Right;  . Tee without cardioversion N/A 02/19/2015    Procedure: TRANSESOPHAGEAL ECHOCARDIOGRAM (TEE);  Surgeon: Tonny Bollman, MD;  Location: [facility] OR;  Service: Open Heart Surgery;  Laterality: N/A;    Social History   Social History  . Marital Status: Married    Spouse Name: N/A  . Number of Children: N/A  . Years of Education: N/A   Occupational History  . Not on file.   Social History Main Topics  . Smoking status: Former Smoker -- 2.00 packs/day for 30 years    Types: Cigarettes    Start date: 04/20/1978    Quit date: 04/21/2007  . Smokeless tobacco: Never Used  . Alcohol Use: No  . Drug Use: No  . Sexual Activity: No   Other Topics Concern  . Not on file   Social History Narrative   Activity: no regular exercise   Diet: limits 32 oz/day due to dialysis, some fruits/vegetables    Family History  Problem Relation Age of Onset  . Deep vein thrombosis Brother   . Cancer Mother 58    colon  . Stroke Mother   . Cancer Father     prostate  . Hypertension Father   . Dementia Father 73  . Hypertension Brother   . Diabetes Neg Hx   . CAD Neg Hx   . Cancer Daughter 73    breast    Allergies as of 10/21/2015 - Review Complete 10/21/2015  Allergen Reaction Noted  . Nodolor [isometheptene-dichloral-apap] Other (See Comments) 12/28/2014    No current facility-administered medications on file prior to encounter.   Current Outpatient Prescriptions on File Prior to Encounter  Medication Sig Dispense Refill  . acetaminophen (TYLENOL) 500 MG tablet Take 1,000 mg by mouth every 6 (six) hours as needed for mild pain or headache.     . albuterol (VENTOLIN HFA) 108 (90 BASE) MCG/ACT inhaler Inhale 2 puffs into the lungs every 4 (four) hours as needed for wheezing or shortness of breath. 18 g 3  . aspirin EC 325 MG EC tablet Take 1 tablet (325 mg total) by mouth daily. 30 tablet 0  . baclofen (LIORESAL) 10 MG tablet Take  0.5-1 tablets (5-10 mg total) by mouth 2 (two) times daily as needed for muscle spasms. 30 each 0  . calcium acetate (PHOSLO) 667 MG capsule Take 667-2,001 mg by mouth 3 (three) times daily with meals. Take 3 with meals and 1 with a snack    . cyanocobalamin (,VITAMIN B-12,) 1000 MCG/ML injection Inject 1,000 mcg into the muscle every 30 (thirty) days.    . furosemide (LASIX) 20 MG tablet Take 1 tablet (20 mg total) by mouth daily. 30 tablet 6  . lidocaine-prilocaine (EMLA) cream Apply  1 application topically as needed (topical anesthesia for hemodialysis if Gebauers and Lidocaine injection are ineffective.). 30 g 0  . pantoprazole (PROTONIX) 40 MG tablet Take 1 tablet (40 mg total) by mouth daily as needed (for heartburn/indigestion). 90 tablet 1  . traZODone (DESYREL) 50 MG tablet Take 50 mg by mouth at bedtime.   3  . umeclidinium-vilanterol (ANORO ELLIPTA) 62.5-25 MCG/INH AEPB Inhale 1 puff into the lungs daily. 60 each 5     REVIEW OF SYSTEMS: Constitutional: Negative for fever and chills.  HENT: Negative for hearing loss.  Eyes: Negative for blurred vision.  Respiratory: Negative for shortness of breath.  Cardiovascular: Negative for chest pain.  Gastrointestinal: Negative for nausea and vomiting.  Genitourinary: Negative for dysuria.  Musculoskeletal: Positive for joint pain.  Skin: Negative for rash.  Neurological: Negative for sensory change.  PHYSICAL EXAMINATION: General: The patient appears their stated age.  Vital signs are BP 113/63 mmHg  Pulse 81  Temp(Src) 98.5 F (36.9 C) (Oral)  Resp 18  Ht 5\' 4"  (1.626 m)  Wt 215 lb (97.523 kg)  BMI 36.89 kg/m2  SpO2 94% Pulmonary: Respirations are non-labored HEENT:  No gross abnormalities Abdomen: Soft and non-tender  Musculoskeletal: There are no major deformities.   Neurologic: No focal weakness or paresthesias are detected, Skin: There are no ulcer or rashes noted. Psychiatric: The patient has normal  affect. Cardiovascular: palpable thrill in left arm fistula.  Edema to left leg  Diagnostic Studies: I have reviewed her u/s with the following left leg findings: Common Femoral Vein: Nonocclusive thrombus noted.  Saphenofemoral Junction: Occlusive thrombus identified with decreased compressibility.  Profunda Femoral Vein: Occlusive thrombus identified with decreased compressibility.  Femoral Vein: Occlusive thrombus identified with decreased compressibility.  Popliteal Vein: Occlusive thrombus identified with decreased compressibility.  Calf Veins: Not well visualized   Assessment:  Left leg DVT Plan: The patient has an acute DVT in her left leg which is causing her pain and swelling. She has not been on anticoagulation because of a history of GI bleed.  She is tolerating ASA and Plavix.  I doubt she would be a good candidate for thrombolysis, especially if her symptoms have been going on for 3 weeks, as she told me.  I would re-consider treatment with anticoagulation.  She does not want to take coumadin, but would consider a NOAC if agreed upon by GI.  She would benefit from thigh high compression stockings (she does not tolerate knee high) in addition to leg elevation.  [facility] to follow up tomorrow     V. Charlena Cross, M.D. [facility] Office: [phone] Pager:  [phone]

## 2015-10-22 NOTE — Progress Notes (Signed)
[facility]  ROUNDING NOTE   Subjective:  Patient presents with LLE edema and DVT.  Has hx of prior DVT with IVC filter placment. Also has hx of GI bleed on coumadin.   Objective:  Vital signs in last 24 hours:  Temp:  [98.3 F (36.8 C)-98.4 F (36.9 C)] 98.4 F (36.9 C) (07/04 0458) Pulse Rate:  [69-131] 78 (07/04 0458) Resp:  [13-24] 18 (07/04 0458) BP: (85-125)/(44-79) 106/45 mmHg (07/04 0458) SpO2:  [95 %-100 %] 98 % (07/04 0458) Weight:  [97.523 kg (215 lb)] 97.523 kg (215 lb) (07/03 1538)  Weight change:  Filed Weights   10/21/15 1538  Weight: 97.523 kg (215 lb)    Intake/Output:     Intake/Output this shift:  Total I/O In: 120 [P.O.:120] Out: 0   Physical Exam: General: NAD, resting in bed  Head: Normocephalic, atraumatic. Moist oral mucosal membranes  Eyes: Anicteric, PERRL  Neck: Supple, trachea midline  Lungs:  Clear to auscultation  Heart: Regular rate and rhythm  Abdomen:  Soft, nontender,   Extremities: LLE edema noted, swelling and tenderness  Neurologic: Nonfocal, moving all four extremities  Skin: No lesions  Access: LUE AVF    Basic Metabolic Panel:  Recent Labs Lab 10/21/15 1611  NA 139  K 4.2  CL 97*  CO2 30  GLUCOSE 98  BUN 21*  CREATININE 4.89*  CALCIUM 9.2    Liver Function Tests: No results for input(s): AST, ALT, ALKPHOS, BILITOT, PROT, ALBUMIN in the last 168 hours. No results for input(s): LIPASE, AMYLASE in the last 168 hours. No results for input(s): AMMONIA in the last 168 hours.  CBC:  Recent Labs Lab 10/21/15 1611  WBC 7.3  NEUTROABS 5.7  HGB 11.4*  HCT 34.4*  MCV 100.6*  PLT 169    Cardiac Enzymes: No results for input(s): CKTOTAL, CKMB, CKMBINDEX, TROPONINI in the last 168 hours.  BNP: Invalid input(s): POCBNP  CBG: No results for input(s): GLUCAP in the last 168 hours.  Microbiology: Results for orders placed or performed during the hospital encounter of 10/21/15  Culture,  blood (routine x 2)     Status: None (Preliminary result)   Collection Time: 10/21/15  4:00 PM  Result Value Ref Range Status   Specimen Description BLOOD RIGHT HAND  Final   Special Requests   Final    BOTTLES DRAWN AEROBIC AND ANAEROBIC  AERO 2CC ANA 3CC   Culture NO GROWTH < 24 HOURS  Final   Report Status PENDING  Incomplete  Culture, blood (routine x 2)     Status: None (Preliminary result)   Collection Time: 10/21/15  4:11 PM  Result Value Ref Range Status   Specimen Description BLOOD RIGHT ASSIST CONTROL  Final   Special Requests   Final    BOTTLES DRAWN AEROBIC AND ANAEROBIC  AERO 14CC ANA 10CC   Culture NO GROWTH < 24 HOURS  Final   Report Status PENDING  Incomplete  MRSA PCR Screening     Status: None   Collection Time: 10/22/15  5:21 AM  Result Value Ref Range Status   MRSA by PCR NEGATIVE NEGATIVE Final    Comment:        The GeneXpert MRSA Assay (FDA approved for NASAL specimens only), is one component of a comprehensive MRSA colonization surveillance program. It is not intended to diagnose MRSA infection nor to guide or monitor treatment for MRSA infections.     Coagulation Studies: No results for input(s): LABPROT, INR in the  last 72 hours.  Urinalysis: No results for input(s): COLORURINE, LABSPEC, PHURINE, GLUCOSEU, HGBUR, BILIRUBINUR, KETONESUR, PROTEINUR, UROBILINOGEN, NITRITE, LEUKOCYTESUR in the last 72 hours.  Invalid input(s): APPERANCEUR    Imaging: US Venous Img Lower Unilateral Left  10/21/2015  CLINICAL DATA:  Leg pain and swelling EXAM: Left LOWER EXTREMITY VENOUS DOPPLER ULTRASOUND TECHNIQUE: Gray-scale sonography with graded compression, as well as color Doppler and duplex ultrasound were performed to evaluate the lower extremity deep venous systems from the level of the common femoral vein and including the common femoral, femoral, profunda femoral, popliteal and calf veins including the posterior tibial, peroneal and gastrocnemius veins when  visible. The superficial great saphenous vein was also interrogated. Spectral Doppler was utilized to evaluate flow at rest and with distal augmentation maneuvers in the common femoral, femoral and popliteal veins. COMPARISON:  None. FINDINGS: Contralateral Common Femoral Vein: Respiratory phasicity is normal and symmetric with the symptomatic side. No evidence of thrombus. Normal compressibility. Common Femoral Vein: Nonocclusive thrombus noted. Saphenofemoral Junction: Occlusive thrombus identified with decreased compressibility. Profunda Femoral Vein: Occlusive thrombus identified with decreased compressibility. Femoral Vein: Occlusive thrombus identified with decreased compressibility. Popliteal Vein: Occlusive thrombus identified with decreased compressibility. Calf Veins: Not well visualized Superficial Great Saphenous Vein: The greater saphenous vein from junction to upper calf is also noted. Venous Reflux:  None. Other Findings: IMPRESSION: 1. Examination is positive for deep vein thrombosis within the left lower extremity These results will be called to the ordering clinician or representative by the Radiologist Assistant, and communication documented in the PACS or zVision Dashboard. Electronically Signed   By: Signa Kell M.D.   On: 10/21/2015 17:49     Medications:     . antiseptic oral rinse  7 mL Mouth Rinse BID  . aspirin EC  325 mg Oral Daily  . calcitRIOL  0.25 mcg Oral Daily  . calcium acetate  2,001 mg Oral TID WC  .  ceFAZolin (ANCEF) IV  1 g Intravenous Q8H  . clopidogrel  75 mg Oral Daily  . [START ON 11/19/2015] cyanocobalamin  1,000 mcg Intramuscular Q30 days  . donepezil  5 mg Oral QHS  . furosemide  20 mg Oral Daily  . multivitamin with minerals  1 tablet Oral Daily  . pravastatin  10 mg Oral q1800  . sodium chloride flush  3 mL Intravenous Q12H  . traZODone  50 mg Oral QHS  . umeclidinium-vilanterol  1 puff Inhalation Daily   sodium chloride, acetaminophen,  albuterol, ALPRAZolam, baclofen, docusate sodium, oxyCODONE, pantoprazole, sodium chloride flush  Assessment/ Plan:  70 y.o. female with pmhx of ESRD MWF, SHPTH, AOCD, HTN, hyperlipidemia, COPD, DVT with PE, tx with long term coumadin and IVC filter placement, L knee TKA, R shoulder surgery, IVC filter removed Aug 2014  [address]/MWF/[facility]  1.  ESRD on HD MWF:  No acute indication for dialysis at the moment.  We will plan for HD again tomorrow.    2.  Anemia of CKD:  Hgb 11.4 at the moment, hold off on epogen.    3.  SHPTH:  No need for calcitriol as pt on IV vitamin D as outpt.  Continue phoslo, check ipth/phos tomorrow.   4.  LLE DVT:  Vascular surgery consult pending, pt with prior hx of DVT and hx of IVC filter placement s/p removal.  Also on cefazolin for associated mild cellulitis.    LOS: 1 Jazel Nimmons 7/4/201711:53 AM

## 2015-10-22 NOTE — Progress Notes (Signed)
°   10/22/15 0600  Clinical Encounter Type  Visited With Patient  Visit Type Initial  Referral From Nurse  Consult/Referral To Chaplain  Spiritual Encounters  Spiritual Needs Prayer  Stress Factors  Patient Stress Factors Health changes;Exhausted  Family Stress Factors None identified  Patient was reluctant to talk at first, warmed up and expressed fatigue with a proliferation of hospital visits in the last year. Dialysis is taking a toll and the volume of medication. Patient did however express hope and faith. She appears resolved that this is her disposition and delights in her close knit family. Patient was in good spirits upon chaplain's departure.

## 2015-10-22 NOTE — Progress Notes (Addendum)
[facility] [place] at [facility]   PATIENT NAME: Ariel Smith    MR#:  [id number]  DATE OF BIRTH:  1945-12-03  SUBJECTIVE:   Came in with left leg swelling REVIEW OF SYSTEMS:   Review of Systems  Constitutional: Negative for fever, chills and weight loss.  HENT: Negative for ear discharge, ear pain and nosebleeds.   Eyes: Negative for blurred vision, pain and discharge.  Respiratory: Negative for sputum production, shortness of breath, wheezing and stridor.   Cardiovascular: Positive for leg swelling. Negative for chest pain, palpitations, orthopnea and PND.  Gastrointestinal: Negative for nausea, vomiting, abdominal pain and diarrhea.  Genitourinary: Negative for urgency and frequency.  Musculoskeletal: Negative for back pain and joint pain.  Skin: Positive for rash.  Neurological: Negative for sensory change, speech change, focal weakness and weakness.  Psychiatric/Behavioral: Negative for depression and hallucinations. The patient is not nervous/anxious.   All other systems reviewed and are negative.  Tolerating Diet:yes Tolerating PT: pending  DRUG ALLERGIES:   Allergies  Allergen Reactions  . Nodolor [Isometheptene-Dichloral-Apap] Other (See Comments)    Reaction:  Headaches     VITALS:  Blood pressure 106/45, pulse 78, temperature 98.4 F (36.9 C), temperature source Oral, resp. rate 18, height 5\' 4"  (1.626 m), weight 97.523 kg (215 lb), SpO2 98 %.  PHYSICAL EXAMINATION:   Physical Exam  GENERAL:  70 y.o.-year-old patient lying in the bed with no acute distress.  EYES: Pupils equal, round, reactive to light and accommodation. No scleral icterus. Extraocular muscles intact.  HEENT: Head atraumatic, normocephalic. Oropharynx and nasopharynx clear.  NECK:  Supple, no jugular venous distention. No thyroid enlargement, no tenderness.  LUNGS: Normal breath sounds bilaterally, no wheezing, rales, rhonchi. No use of accessory muscles of  respiration.  CARDIOVASCULAR: S1, S2 normal. No murmurs, rubs, or gallops.  ABDOMEN: Soft, nontender, nondistended. Bowel sounds present. No organomegaly or mass.  EXTREMITIES: No cyanosis, clubbing or edema b/l.    NEUROLOGIC: Cranial nerves II through XII are intact. No focal Motor or sensory deficits b/l.   PSYCHIATRIC: alert and oriented x 3.  SKIN: No obvious rash, lesion, or ulcer.   LABORATORY PANEL:  CBC  Recent Labs Lab 10/21/15 1611  WBC 7.3  HGB 11.4*  HCT 34.4*  PLT 169    Chemistries   Recent Labs Lab 10/21/15 1611  NA 139  K 4.2  CL 97*  CO2 30  GLUCOSE 98  BUN 21*  CREATININE 4.89*  CALCIUM 9.2   Cardiac Enzymes No results for input(s): TROPONINI in the last 168 hours. RADIOLOGY:  US Venous Img Lower Unilateral Left  10/21/2015  CLINICAL DATA:  Leg pain and swelling EXAM: Left LOWER EXTREMITY VENOUS DOPPLER ULTRASOUND TECHNIQUE: Gray-scale sonography with graded compression, as well as color Doppler and duplex ultrasound were performed to evaluate the lower extremity deep venous systems from the level of the common femoral vein and including the common femoral, femoral, profunda femoral, popliteal and calf veins including the posterior tibial, peroneal and gastrocnemius veins when visible. The superficial great saphenous vein was also interrogated. Spectral Doppler was utilized to evaluate flow at rest and with distal augmentation maneuvers in the common femoral, femoral and popliteal veins. COMPARISON:  None. FINDINGS: Contralateral Common Femoral Vein: Respiratory phasicity is normal and symmetric with the symptomatic side. No evidence of thrombus. Normal compressibility. Common Femoral Vein: Nonocclusive thrombus noted. Saphenofemoral Junction: Occlusive thrombus identified with decreased compressibility. Profunda Femoral Vein: Occlusive thrombus identified with decreased compressibility. Femoral Vein:  Occlusive thrombus identified with decreased  compressibility. Popliteal Vein: Occlusive thrombus identified with decreased compressibility. Calf Veins: Not well visualized Superficial Great Saphenous Vein: The greater saphenous vein from junction to upper calf is also noted. Venous Reflux:  None. Other Findings: IMPRESSION: 1. Examination is positive for deep vein thrombosis within the left lower extremity These results will be called to the ordering clinician or representative by the Radiologist Assistant, and communication documented in the PACS or zVision Dashboard. Electronically Signed   By: Signa Kell M.D.   On: 10/21/2015 17:49   ASSESSMENT AND PLAN:  Mrs. Beitz is a 70 year old Caucasian female with past medical history of end-stage renal disease on hemodialysis history of DVT in the left lower extremity in the past with PE status post IVC filter placement since she is intolerant to blood thinners given her history of GI bleed comes in to the emergency room with 2 week history of what started as left ankle swelling which has progressed to leg swelling . Seen in the Ed a few days ago and US showed her chronic left lower ext DVT was stable but she had new superficial DVT  1. Cellulitis Left Lower Ext: Start abx. This may be all related to the DVTs but difficult to tell so will start abx and observe for improvement.   2. 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 Cannot tolerate anticoagulants because of GI bleeding in the past. However, she is tolerating ASA and plavix. New superficial clots are causing a lot of pain and swelling.  -discuss possibility with vascular surgery.  3. ESRD: On HD.  4. COPD: Continue current meds  Case discussed with Care Management/Social Worker. Management plans discussed with the patient, family and they are in agreement.  CODE STATUS: full  DVT Prophylaxis: SCD/TEDS  TOTAL TIME TAKING CARE OF THIS PATIENT: 30 minutes.   >50% time spent on counselling and coordination of care  POSSIBLE D/C IN1-2  DAYS, DEPENDING ON CLINICAL CONDITION.  Note: This dictation was prepared with Dragon dictation along with smaller phrase technology. Any transcriptional errors that result from this process are unintentional.  Ison Wichmann M.D on 10/22/2015 at 10:54 AM  Between 7am to 6pm - Pager - [phone]  After 6pm go to [email or web address] - password [email or web address] [facility]  [facility] [place] [facility]  Office  [phone]  CC: Primary care physician; [name], MD

## 2015-10-23 LAB — PHOSPHORUS: PHOSPHORUS: 8 mg/dL — AB (ref 2.5–4.6)

## 2015-10-23 LAB — SURGICAL PCR SCREEN
MRSA, PCR: NEGATIVE
STAPHYLOCOCCUS AUREUS: NEGATIVE

## 2015-10-23 MED ORDER — SODIUM CHLORIDE 0.9 % IV SOLN
INTRAVENOUS | Status: DC
  Administered 2015-10-24: 08:00:00 via INTRAVENOUS

## 2015-10-23 MED ORDER — ASPIRIN 81 MG PO CHEW
81.0000 mg | CHEWABLE_TABLET | Freq: Every day | ORAL | Status: DC
  Filled 2015-10-23 (×2): qty 1

## 2015-10-23 MED ORDER — SODIUM CHLORIDE 0.9 % IV SOLN: 100.0000 mL | INTRAVENOUS | Status: DC | PRN

## 2015-10-23 MED ORDER — HEPARIN BOLUS VIA INFUSION
4600.0000 [IU] | Freq: Once | INTRAVENOUS | Status: DC
  Filled 2015-10-23: qty 4600

## 2015-10-23 MED ORDER — HEPARIN (PORCINE) IN NACL 100-0.45 UNIT/ML-% IJ SOLN
1300.0000 [IU]/h | INTRAMUSCULAR | Status: DC
  Administered 2015-10-23 – 2015-10-24 (×2): 1300 [IU]/h via INTRAVENOUS
  Filled 2015-10-23 (×2): qty 250

## 2015-10-23 MED ORDER — CHLORHEXIDINE GLUCONATE CLOTH 2 % EX PADS
6.0000 | MEDICATED_PAD | Freq: Once | CUTANEOUS | Status: AC
  Administered 2015-10-23: 6 via TOPICAL

## 2015-10-23 MED ORDER — ALTEPLASE 2 MG IJ SOLR
2.0000 mg | Freq: Once | INTRAMUSCULAR | Status: DC | PRN
  Filled 2015-10-23: qty 2

## 2015-10-23 MED ORDER — LIDOCAINE HCL (PF) 1 % IJ SOLN
5.0000 mL | INTRAMUSCULAR | Status: DC | PRN
  Filled 2015-10-23: qty 5

## 2015-10-23 MED ORDER — LIDOCAINE-PRILOCAINE 2.5-2.5 % EX CREA
1.0000 "application " | TOPICAL_CREAM | CUTANEOUS | Status: DC | PRN
  Filled 2015-10-23: qty 5

## 2015-10-23 MED ORDER — PENTAFLUOROPROP-TETRAFLUOROETH EX AERO
1.0000 "application " | INHALATION_SPRAY | CUTANEOUS | Status: DC | PRN
  Filled 2015-10-23: qty 30

## 2015-10-23 MED ORDER — HEPARIN SODIUM (PORCINE) 1000 UNIT/ML DIALYSIS
1000.0000 [IU] | INTRAMUSCULAR | Status: DC | PRN
  Filled 2015-10-23: qty 1

## 2015-10-23 NOTE — Progress Notes (Signed)
Post dialysis 

## 2015-10-23 NOTE — Progress Notes (Signed)
[facility] - [place] at [facility]   PATIENT NAME: Ariel Smith    MR#:  [id number]  DATE OF BIRTH:  28-Jul-1945  SUBJECTIVE:  CHIEF COMPLAINT:   Chief Complaint  Patient presents with  . Leg Pain   -Admitted with bilateral lower extremity swelling, left greater than right. Acute on chronic DVT noted. -Not an anticoagulation in the past due to history of GI bleed. -Stable now, no active bleeding and hemoglobin is greater than 11. -Due for dialysis today  REVIEW OF SYSTEMS:  Review of Systems  Constitutional: Positive for malaise/fatigue. Negative for fever and chills.  HENT: Negative for ear discharge, ear pain and nosebleeds.   Eyes: Negative for blurred vision and double vision.  Respiratory: Negative for cough, shortness of breath and wheezing.   Cardiovascular: Positive for leg swelling. Negative for chest pain and palpitations.  Gastrointestinal: Negative for nausea, vomiting, abdominal pain, diarrhea and constipation.  Genitourinary: Negative for dysuria.  Musculoskeletal: Positive for myalgias.  Neurological: Negative for dizziness, sensory change, speech change, focal weakness, seizures and headaches.  Psychiatric/Behavioral: Negative for depression.    DRUG ALLERGIES:   Allergies  Allergen Reactions  . Nodolor [Isometheptene-Dichloral-Apap] Other (See Comments)    Reaction:  Headaches     VITALS:  Blood pressure 113/64, pulse 79, temperature 98 F (36.7 C), temperature source Oral, resp. rate 20, height 5\' 4"  (1.626 m), weight 97.523 kg (215 lb), SpO2 100 %.  PHYSICAL EXAMINATION:  Physical Exam  GENERAL:  70 y.o.-year-old patient lying in the bed with no acute distress.  EYES: Pupils equal, round, reactive to light and accommodation. No scleral icterus. Extraocular muscles intact.  HEENT: Head atraumatic, normocephalic. Oropharynx and nasopharynx clear.  NECK:  Supple, no jugular venous distention. No thyroid enlargement, no  tenderness.  LUNGS: Normal breath sounds bilaterally, no wheezing, rales,rhonchi or crepitation. No use of accessory muscles of respiration. Decreased bibasilar breath sounds noted. CARDIOVASCULAR: S1, S2 normal. No murmurs, rubs, or gallops.  ABDOMEN: Soft, nontender, nondistended. Bowel sounds present. No organomegaly or mass.  EXTREMITIES: No  cyanosis, or clubbing. 1+ right leg edema, 2+ left lower extremity edema. Palpable dorsalis pedis pulses. Right arm AV fistula present NEUROLOGIC: Cranial nerves II through XII are intact. Muscle strength 5/5 in all extremities. Sensation intact. Gait not checked.  PSYCHIATRIC: The patient is alert and oriented x 3.  SKIN: No obvious rash, lesion, or ulcer.    LABORATORY PANEL:   CBC  Recent Labs Lab 10/21/15 1611  WBC 7.3  HGB 11.4*  HCT 34.4*  PLT 169   ------------------------------------------------------------------------------------------------------------------  Chemistries   Recent Labs Lab 10/21/15 1611  NA 139  K 4.2  CL 97*  CO2 30  GLUCOSE 98  BUN 21*  CREATININE 4.89*  CALCIUM 9.2   ------------------------------------------------------------------------------------------------------------------  Cardiac Enzymes No results for input(s): TROPONINI in the last 168 hours. ------------------------------------------------------------------------------------------------------------------  RADIOLOGY:  US Venous Img Lower Unilateral Left  10/21/2015  CLINICAL DATA:  Leg pain and swelling EXAM: Left LOWER EXTREMITY VENOUS DOPPLER ULTRASOUND TECHNIQUE: Gray-scale sonography with graded compression, as well as color Doppler and duplex ultrasound were performed to evaluate the lower extremity deep venous systems from the level of the common femoral vein and including the common femoral, femoral, profunda femoral, popliteal and calf veins including the posterior tibial, peroneal and gastrocnemius veins when visible. The  superficial great saphenous vein was also interrogated. Spectral Doppler was utilized to evaluate flow at rest and with distal augmentation maneuvers in the common femoral, femoral  and popliteal veins. COMPARISON:  None. FINDINGS: Contralateral Common Femoral Vein: Respiratory phasicity is normal and symmetric with the symptomatic side. No evidence of thrombus. Normal compressibility. Common Femoral Vein: Nonocclusive thrombus noted. Saphenofemoral Junction: Occlusive thrombus identified with decreased compressibility. Profunda Femoral Vein: Occlusive thrombus identified with decreased compressibility. Femoral Vein: Occlusive thrombus identified with decreased compressibility. Popliteal Vein: Occlusive thrombus identified with decreased compressibility. Calf Veins: Not well visualized Superficial Great Saphenous Vein: The greater saphenous vein from junction to upper calf is also noted. Venous Reflux:  None. Other Findings: IMPRESSION: 1. Examination is positive for deep vein thrombosis within the left lower extremity These results will be called to the ordering clinician or representative by the Radiologist Assistant, and communication documented in the PACS or zVision Dashboard. Electronically Signed   By: Signa Kell M.D.   On: 10/21/2015 17:49    EKG:   Orders placed or performed during the hospital encounter of 10/21/15  . EKG 12-Lead  . EKG 12-Lead    ASSESSMENT AND PLAN:   70 year old female with past medical history significant for end-stage renal disease on Monday Wednesday Friday hemodialysis, history of DVT noted on anticoagulation due to GI bleed history, COPD admitted with worsening left leg swelling.  #1 acute on chronic DVT of the left leg-patient already status post IVC filter. History of GI bleed and so was not on anticoagulation. -On aspirin and Plavix. Appreciate vascular consult. -Due to progression of clots recently, and since hemoglobin is greater than 11 and no active  bleeding noted -we'll start an IV heparin drip for now. Plan is to change to oral eliquis at discharge-renally adjusted dose. - Planned for thrombolysis/thrombectomy tomorrow. - #2 left lower extremity cellulitis-secondary to DVT. Continue Ancef for now.  #3 end-stage renal disease on Monday Wednesday Friday hemodialysis-for dialysis today per schedule. -Has right arm AV fistula. Appreciate nephrology consult. -Continue renal supplements  #4 COPD-on chronic home oxygen. Continue inhalers. Stable at this time.  #5 DVT prophylaxis-patient is on heparin drip    All the records are reviewed and case discussed with Care Management/Social Workerr. Management plans discussed with the patient, family and they are in agreement.  CODE STATUS: Full code  TOTAL TIME TAKING CARE OF THIS PATIENT: 37 minutes.   POSSIBLE D/C IN 1-2 DAYS, DEPENDING ON CLINICAL CONDITION.   Enid Baas M.D on 10/23/2015 at 2:21 PM  Between 7am to 6pm - Pager - [phone]  After 6pm go to [email or web address] - password [email or web address] [facility]  [place] [facility]  Office  [phone]  CC: Primary care physician; [name], MD

## 2015-10-23 NOTE — Progress Notes (Signed)
[facility]  ROUNDING NOTE   Subjective:  Patient seen by vascular surgery yesterday. Novel anticoagulants to be considered if okay by GI. Patient due for dialysis today.   Objective:  Vital signs in last 24 hours:  Temp:  [97.9 F (36.6 C)-98.5 F (36.9 C)] 97.9 F (36.6 C) (07/05 0519) Pulse Rate:  [73-91] 91 (07/05 0519) Resp:  [17-18] 17 (07/05 0519) BP: (103-113)/(51-63) 113/51 mmHg (07/05 0519) SpO2:  [93 %-96 %] 93 % (07/05 0519)  Weight change:  Filed Weights   10/21/15 1538  Weight: 97.523 kg (215 lb)    Intake/Output: I/O last 3 completed shifts: In: 703 [P.O.:600; I.V.:3; IV Piggyback:100] Out: 0    Intake/Output this shift:  Total I/O In: 240 [P.O.:240] Out: -   Physical Exam: General: NAD, resting in bed  Head: Normocephalic, atraumatic. Moist oral mucosal membranes  Eyes: Anicteric, PERRL  Neck: Supple, trachea midline  Lungs:  Clear to auscultation  Heart: Regular rate and rhythm  Abdomen:  Soft, nontender,   Extremities: LLE edema noted, swelling and tenderness noted  Neurologic: Nonfocal, moving all four extremities  Skin: No lesions  Access: LUE AVF    Basic Metabolic Panel:  Recent Labs Lab 10/21/15 1611  NA 139  K 4.2  CL 97*  CO2 30  GLUCOSE 98  BUN 21*  CREATININE 4.89*  CALCIUM 9.2    Liver Function Tests: No results for input(s): AST, ALT, ALKPHOS, BILITOT, PROT, ALBUMIN in the last 168 hours. No results for input(s): LIPASE, AMYLASE in the last 168 hours. No results for input(s): AMMONIA in the last 168 hours.  CBC:  Recent Labs Lab 10/21/15 1611  WBC 7.3  NEUTROABS 5.7  HGB 11.4*  HCT 34.4*  MCV 100.6*  PLT 169    Cardiac Enzymes: No results for input(s): CKTOTAL, CKMB, CKMBINDEX, TROPONINI in the last 168 hours.  BNP: Invalid input(s): POCBNP  CBG: No results for input(s): GLUCAP in the last 168 hours.  Microbiology: Results for orders placed or performed during the hospital encounter  of 10/21/15  Culture, blood (routine x 2)     Status: None (Preliminary result)   Collection Time: 10/21/15  4:00 PM  Result Value Ref Range Status   Specimen Description BLOOD RIGHT HAND  Final   Special Requests   Final    BOTTLES DRAWN AEROBIC AND ANAEROBIC  AERO 2CC ANA 3CC   Culture NO GROWTH 2 DAYS  Final   Report Status PENDING  Incomplete  Culture, blood (routine x 2)     Status: None (Preliminary result)   Collection Time: 10/21/15  4:11 PM  Result Value Ref Range Status   Specimen Description BLOOD RIGHT ASSIST CONTROL  Final   Special Requests   Final    BOTTLES DRAWN AEROBIC AND ANAEROBIC  AERO 14CC ANA 10CC   Culture NO GROWTH 2 DAYS  Final   Report Status PENDING  Incomplete  MRSA PCR Screening     Status: None   Collection Time: 10/22/15  5:21 AM  Result Value Ref Range Status   MRSA by PCR NEGATIVE NEGATIVE Final    Comment:        The GeneXpert MRSA Assay (FDA approved for NASAL specimens only), is one component of a comprehensive MRSA colonization surveillance program. It is not intended to diagnose MRSA infection nor to guide or monitor treatment for MRSA infections.     Coagulation Studies: No results for input(s): LABPROT, INR in the last 72 hours.  Urinalysis: No  results for input(s): COLORURINE, LABSPEC, PHURINE, GLUCOSEU, HGBUR, BILIRUBINUR, KETONESUR, PROTEINUR, UROBILINOGEN, NITRITE, LEUKOCYTESUR in the last 72 hours.  Invalid input(s): APPERANCEUR    Imaging: US Venous Img Lower Unilateral Left  10/21/2015  CLINICAL DATA:  Leg pain and swelling EXAM: Left LOWER EXTREMITY VENOUS DOPPLER ULTRASOUND TECHNIQUE: Gray-scale sonography with graded compression, as well as color Doppler and duplex ultrasound were performed to evaluate the lower extremity deep venous systems from the level of the common femoral vein and including the common femoral, femoral, profunda femoral, popliteal and calf veins including the posterior tibial, peroneal and  gastrocnemius veins when visible. The superficial great saphenous vein was also interrogated. Spectral Doppler was utilized to evaluate flow at rest and with distal augmentation maneuvers in the common femoral, femoral and popliteal veins. COMPARISON:  None. FINDINGS: Contralateral Common Femoral Vein: Respiratory phasicity is normal and symmetric with the symptomatic side. No evidence of thrombus. Normal compressibility. Common Femoral Vein: Nonocclusive thrombus noted. Saphenofemoral Junction: Occlusive thrombus identified with decreased compressibility. Profunda Femoral Vein: Occlusive thrombus identified with decreased compressibility. Femoral Vein: Occlusive thrombus identified with decreased compressibility. Popliteal Vein: Occlusive thrombus identified with decreased compressibility. Calf Veins: Not well visualized Superficial Great Saphenous Vein: The greater saphenous vein from junction to upper calf is also noted. Venous Reflux:  None. Other Findings: IMPRESSION: 1. Examination is positive for deep vein thrombosis within the left lower extremity These results will be called to the ordering clinician or representative by the Radiologist Assistant, and communication documented in the PACS or zVision Dashboard. Electronically Signed   By: Signa Kell M.D.   On: 10/21/2015 17:49     Medications:     . antiseptic oral rinse  7 mL Mouth Rinse BID  . aspirin EC  325 mg Oral Daily  . calcium acetate  2,001 mg Oral TID WC  .  ceFAZolin (ANCEF) IV  1 g Intravenous Q8H  . clopidogrel  75 mg Oral Daily  . [START ON 11/19/2015] cyanocobalamin  1,000 mcg Intramuscular Q30 days  . donepezil  5 mg Oral QHS  . furosemide  20 mg Oral Daily  . multivitamin with minerals  1 tablet Oral Daily  . pravastatin  10 mg Oral q1800  . sodium chloride flush  3 mL Intravenous Q12H  . traZODone  50 mg Oral QHS  . umeclidinium-vilanterol  1 puff Inhalation Daily   sodium chloride, sodium chloride, sodium chloride,  acetaminophen, albuterol, ALPRAZolam, alteplase, baclofen, docusate sodium, heparin, lidocaine (PF), lidocaine-prilocaine, oxyCODONE, pantoprazole, pentafluoroprop-tetrafluoroeth, sodium chloride flush  Assessment/ Plan:  70 y.o. female with pmhx of ESRD MWF, SHPTH, AOCD, HTN, hyperlipidemia, COPD, DVT with PE, tx with long term coumadin and IVC filter placement, L knee TKA, R shoulder surgery, IVC filter removed Aug 2014  [address]/MWF/[facility]  1.  ESRD on HD MWF:  Patient due for hemodialysis today. Orders have been prepared.  2.  Anemia of CKD:  Hemoglobin 11.4 at last check. Continue to hold Epogen for now.   3.  SHPTH:  Check phosphorus and PTH today. Continue calcium acetate 2001 1 mg by mouth 3 times a day with meals.  4.  LLE DVT:  Appreciate input from vascular surgery. New anticoagulants to be considered if okay with GI.    LOS: 2 Ishita Mcnerney 7/5/201711:29 AM

## 2015-10-23 NOTE — Care Management (Signed)
Plan for Venous thrombolysis and thrombectomy tomorrow

## 2015-10-23 NOTE — Progress Notes (Signed)
Pre Dialysis 

## 2015-10-23 NOTE — Care Management Important Message (Signed)
Important Message  Patient Details  Name: Ariel Smith MRN: [id number] Date of Birth: 1946-02-24   Medicare Important Message Given:  Yes    Olegario Messier A Tykel Badie 10/23/2015, 2:13 PM

## 2015-10-23 NOTE — Progress Notes (Signed)
ANTICOAGULATION CONSULT NOTE - Initial Consult  Pharmacy Consult for Heparin  Indication: VTE treatment   Allergies  Allergen Reactions  . Nodolor [Isometheptene-Dichloral-Apap] Other (See Comments)    Reaction:  Headaches     Patient Measurements: Height:  (162.6 cm) Weight: 231 lb 14.8 oz (105.2 kg) IBW/kg (Calculated) : 54.7 Heparin Dosing Weight: 77  Vital Signs: Temp: 98.3 F (36.8 C) (07/05 1425) Temp Source: Oral (07/05 1425) BP: 122/62 mmHg (07/05 1434) Pulse Rate: 75 (07/05 1434)  Labs:  Recent Labs  10/21/15 1611  HGB 11.4*  HCT 34.4*  PLT 169  CREATININE 4.89*    Estimated Creatinine Clearance: 12.8 mL/min (by C-G formula based on Cr of 4.89).   Medical History: Past Medical History  Diagnosis Date  . Osteoarthritis   . Depression   . Frequent headaches   . HTN (hypertension)   . HLD (hyperlipidemia)   . History of colon polyps 2013    colonoscopy ([name])  . History of DVT of lower extremity 2009, 2017    left sided x2, with PE s/p IVC filter placement, coumadin stopped 2/2 GI bleed  . GERD (gastroesophageal reflux disease)   . COPD (chronic obstructive pulmonary disease) (HCC)   . ESRD (end stage renal disease) (HCC) 08/2011    a. on HD (TThSa), L forearm AV fistula, [name]  . Secondary hyperparathyroidism of renal origin (HCC)   . Osteopenia 01/2013  . Bronchiectasis (HCC) 08/2014     suggested by thoracic xray  . Anemia of chronic disease   . Severe aortic stenosis     a. echo 10/2014: EF 60-65%, no RWMA, GR1DD, mod to sev AS (peak vel 377 cm/s, mean gradient 34 mm Hg, peak gradient 57 mm Hg, valve area (VTA) 0.72 cm^2  . Chronic respiratory failure (HCC)     a. 2/2 COPD; b. on 4-5L via nasal cannula  . Morbid obesity (HCC)   . GIB (gastrointestinal bleeding)     a. leading to cessation of warfarin 11/2014  . Chronic diastolic CHF (congestive heart failure) (HCC)     a. echo 10/2014: EF 60-65%, no RWMA, GR1DD, severe AS, mild  MR, PASP 46 mm Hg  . Pulmonary embolism (HCC) 2009  . Renal failure     Patient has been on dialysis for " four years" -per patient.  Marland Kitchen Heart murmur   . 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 Acute DVT (deep venous thrombosis) (HCC) 2017    hospitalization    Medications:  Prescriptions prior to admission  Medication Sig Dispense Refill Last Dose  . acetaminophen (TYLENOL) 500 MG tablet Take 1,000 mg by mouth every 6 (six) hours as needed for mild pain or headache.    prn at prn  . albuterol (VENTOLIN HFA) 108 (90 BASE) MCG/ACT inhaler Inhale 2 puffs into the lungs every 4 (four) hours as needed for wheezing or shortness of breath. 18 g 3 prn at prn  . ALPRAZolam (XANAX) 1 MG tablet Take 1 mg by mouth 3 (three) times daily as needed for anxiety.   prn at prn  . aspirin EC 325 MG EC tablet Take 1 tablet (325 mg total) by mouth daily. 30 tablet 0 10/21/2015 at 0530  . baclofen (LIORESAL) 10 MG tablet Take 0.5-1 tablets (5-10 mg total) by mouth 2 (two) times daily as needed for muscle spasms. 30 each 0 prn  at prn  . calcitRIOL (ROCALTROL) 0.25 MCG capsule Take 0.25 mcg by mouth daily.   10/21/2015 at Unknown time  . calcium acetate (PHOSLO) 667 MG capsule Take 667-2,001 mg by mouth 3 (three) times daily with meals. Take 3 with meals and 1 with a snack   10/21/2015 at Unknown time  . clopidogrel (PLAVIX) 75 MG tablet Take 75 mg by mouth daily.   10/21/2015 at 0530  . cyanocobalamin (,VITAMIN B-12,) 1000 MCG/ML injection Inject 1,000 mcg into the muscle every 30 (thirty) days.   Past Month at Unknown time  . docusate sodium (COLACE) 100 MG capsule Take 100 mg by mouth 2 (two) times daily as needed for mild constipation.   prn at prn  . donepezil (ARICEPT) 5 MG tablet Take 5 mg by mouth at bedtime.   10/20/2015 at Unknown time  . furosemide (LASIX) 20 MG tablet Take 1 tablet (20 mg  total) by mouth daily. 30 tablet 6 10/21/2015 at Unknown time  . lidocaine-prilocaine (EMLA) cream Apply 1 application topically as needed (topical anesthesia for hemodialysis if Gebauers and Lidocaine injection are ineffective.). 30 g 0 prn at prn  . lovastatin (MEVACOR) 20 MG tablet Take 20 mg by mouth at bedtime.   10/20/2015 at Unknown time  . Multiple Vitamin (MULTIVITAMIN WITH MINERALS) TABS tablet Take 1 tablet by mouth daily.   10/21/2015 at Unknown time  . pantoprazole (PROTONIX) 40 MG tablet Take 1 tablet (40 mg total) by mouth daily as needed (for heartburn/indigestion). 90 tablet 1 prn at prn  . promethazine (PHENERGAN) 12.5 MG tablet Take 12.5 mg by mouth every 6 (six) hours as needed for nausea or vomiting.   prn at prn  . traZODone (DESYREL) 50 MG tablet Take 50 mg by mouth at bedtime.   3 10/20/2015 at Unknown time  . umeclidinium-vilanterol (ANORO ELLIPTA) 62.5-25 MCG/INH AEPB Inhale 1 puff into the lungs daily. 60 each 5 10/21/2015 at Unknown time   Scheduled:  . antiseptic oral rinse  7 mL Mouth Rinse BID  . aspirin  81 mg Oral Daily  . calcium acetate  2,001 mg Oral TID WC  .  ceFAZolin (ANCEF) IV  1 g Intravenous Q8H  . clopidogrel  75 mg Oral Daily  . [START ON 11/19/2015] cyanocobalamin  1,000 mcg Intramuscular Q30 days  . donepezil  5 mg Oral QHS  . furosemide  20 mg Oral Daily  . heparin  4,600 Units Intravenous Once  . multivitamin with minerals  1 tablet Oral Daily  . pravastatin  10 mg Oral q1800  . sodium chloride flush  3 mL Intravenous Q12H  . traZODone  50 mg Oral QHS  . umeclidinium-vilanterol  1 puff Inhalation Daily    Assessment: Pharmacy consulted to dose and monitor heparin therapy in this 70 year old female being treated for VTE.  Goal of Therapy:  Heparin level 0.3-0.7 units/ml Monitor platelets by anticoagulation protocol: Yes   Plan:  Give 4600 units bolus x 1 Start heparin infusion at 1300 units/hr Check anti-Xa level in 8 hours and daily while on  heparin Continue to monitor H&H and platelets  Raymonde Hamblin D 10/23/2015,3:18 PM

## 2015-10-23 NOTE — Progress Notes (Signed)
Tx complete  

## 2015-10-23 NOTE — Progress Notes (Signed)
Dialysis started 

## 2015-10-23 NOTE — Progress Notes (Signed)
[facility]  Daily Progress Note   Subjective  - * No surgery found *  Leg swelling not really better on anticoagulation Started with major symptoms two weeks ago.  Duplex reviewed, extensive LLE DVT likely acute on chronic in nature.   Also with some hand pain bilaterally States her left arm AVF is working well, but some soreness at access site.  Objective Filed Vitals:   10/22/15 0458 10/22/15 1300 10/22/15 2134 10/23/15 0519  BP: 106/45 103/56 113/63 113/51  Pulse: 78 73 81 91  Temp: 98.4 F (36.9 C) 98.2 F (36.8 C) 98.5 F (36.9 C) 97.9 F (36.6 C)  TempSrc: Oral Oral Oral Oral  Resp: 18 18 18 17   Height:      Weight:      SpO2: 98% 96% 94% 93%    Intake/Output Summary (Last 24 hours) at 10/23/15 1204 Last data filed at 10/23/15 0900  Gross per 24 hour  Intake    823 ml  Output      0 ml  Net    823 ml    PULM  CTAB CV  RRR VASC  LLE swelling 3+, mild redness. RLE with 1+ swelling Left arm AVF with good thrill and bruit   Laboratory CBC    Component Value Date/Time   WBC 7.3 10/21/2015 1611   WBC 8.0 01/11/2014 1559   HGB 11.4* 10/21/2015 1611   HGB 9.0* 01/11/2014 1559   HGB 9.0 06/02/2013   HCT 34.4* 10/21/2015 1611   HCT 27.0* 01/11/2014 1559   PLT 169 10/21/2015 1611   PLT 149* 01/15/2014 0510    BMET    Component Value Date/Time   NA 139 10/21/2015 1611   NA 142 01/11/2014 1559   NA 141 08/10/2011   K 4.2 10/21/2015 1611   K 3.7 01/11/2014 1559   K 4.3 08/10/2011   CL 97* 10/21/2015 1611   CL 109* 01/11/2014 1559   CO2 30 10/21/2015 1611   CO2 24 01/11/2014 1559   GLUCOSE 98 10/21/2015 1611   GLUCOSE 106* 01/11/2014 1559   BUN 21* 10/21/2015 1611   BUN 42* 01/11/2014 1559   CREATININE 4.89* 10/21/2015 1611   CREATININE 6.17* 03/30/2014 1724   CREATININE 6.27* 01/11/2014 1559   CALCIUM 9.2 10/21/2015 1611   CALCIUM 8.5 01/11/2014 1559   CALCIUM 8.4 06/02/2013   GFRNONAA 8* 10/21/2015 1611   GFRNONAA 7*  01/11/2014 1559   GFRNONAA 5* 11/21/2013 0027   GFRAA 10* 10/21/2015 1611   GFRAA 9* 01/11/2014 1559   GFRAA 5* 11/21/2013 0027    Assessment/Planning: Extensive LLE DVT, acute on chronic.  Patient very symptomatic.  Discussed options and most recent worsening in last two weeks.  Venous intervention likely of benefit and can plan  Venous thrombolysis and thrombectomy tomorrow.  She already has a filter in place    Given her known hypercoagulable state and previous multiple DVTs, should consider full anticoagulation with Eliquis or Xarelto on discharge  Would consider 2.5 mg bid of Eliquis as first option  ESRD, on dialysis.  Access working well    Carver Murakami  10/23/2015, 12:04 PM

## 2015-10-24 ENCOUNTER — Ambulatory Visit: Payer: Medicare Other

## 2015-10-24 ENCOUNTER — Encounter: Payer: Self-pay | Admitting: *Deleted

## 2015-10-24 ENCOUNTER — Encounter
Admission: EM | Disposition: A | Discharge status: Home Health | Payer: Self-pay | Source: Home / Self Care | Attending: Internal Medicine

## 2015-10-24 HISTORY — PX: PERIPHERAL VASCULAR CATHETERIZATION: SHX172C

## 2015-10-24 LAB — APTT: aPTT: 40 seconds — ABNORMAL HIGH (ref 24–36)

## 2015-10-24 LAB — BASIC METABOLIC PANEL
ANION GAP: 10 (ref 5–15)
BUN: 37 mg/dL — ABNORMAL HIGH (ref 6–20)
CALCIUM: 9.2 mg/dL (ref 8.9–10.3)
CO2: 31 mmol/L (ref 22–32)
Chloride: 97 mmol/L — ABNORMAL LOW (ref 101–111)
Creatinine, Ser: 6.41 mg/dL — ABNORMAL HIGH (ref 0.44–1.00)
GFR, EST AFRICAN AMERICAN: 7 mL/min — AB (ref >60)
GFR, EST NON AFRICAN AMERICAN: 6 mL/min — AB (ref >60)
Glucose, Bld: 103 mg/dL — ABNORMAL HIGH (ref 65–99)
POTASSIUM: 5 mmol/L (ref 3.5–5.1)
SODIUM: 138 mmol/L (ref 135–145)

## 2015-10-24 LAB — HEPARIN LEVEL (UNFRACTIONATED)
HEPARIN UNFRACTIONATED: 0.47 [IU]/mL (ref 0.30–0.70)
Heparin Unfractionated: 0.36 IU/mL (ref 0.30–0.70)

## 2015-10-24 LAB — CBC
HCT: 33.1 % — ABNORMAL LOW (ref 35.0–47.0)
Hemoglobin: 11 g/dL — ABNORMAL LOW (ref 12.0–16.0)
MCH: 33.2 pg (ref 26.0–34.0)
MCHC: 33.1 g/dL (ref 32.0–36.0)
MCV: 100.2 fL — AB (ref 80.0–100.0)
PLATELETS: 148 10*3/uL — AB (ref 150–440)
RBC: 3.3 MIL/uL — AB (ref 3.80–5.20)
RDW: 17.1 % — AB (ref 11.5–14.5)
WBC: 5.5 10*3/uL (ref 3.6–11.0)

## 2015-10-24 LAB — PROTIME-INR
INR: 1.17
Prothrombin Time: 15.1 seconds — ABNORMAL HIGH (ref 11.4–15.0)

## 2015-10-24 LAB — PARATHYROID HORMONE, INTACT (NO CA): PTH: 181 pg/mL — ABNORMAL HIGH (ref 15–65)

## 2015-10-24 SURGERY — LOWER EXTREMITY VENOGRAPHY
Anesthesia: Moderate Sedation | Laterality: Left

## 2015-10-24 MED ORDER — FENTANYL CITRATE (PF) 100 MCG/2ML IJ SOLN
INTRAMUSCULAR | Status: AC
  Filled 2015-10-24: qty 2

## 2015-10-24 MED ORDER — MIDAZOLAM HCL 2 MG/2ML IJ SOLN
INTRAMUSCULAR | Status: AC
  Filled 2015-10-24: qty 2

## 2015-10-24 MED ORDER — ALTEPLASE 2 MG IJ SOLR
INTRAMUSCULAR | Status: AC
  Filled 2015-10-24: qty 8

## 2015-10-24 MED ORDER — SODIUM CHLORIDE FLUSH 0.9 % IV SOLN
INTRAVENOUS | Status: AC
  Filled 2015-10-24: qty 10

## 2015-10-24 MED ORDER — LIDOCAINE-EPINEPHRINE (PF) 1 %-1:200000 IJ SOLN
INTRAMUSCULAR | Status: AC
  Filled 2015-10-24: qty 30

## 2015-10-24 MED ORDER — FENTANYL CITRATE (PF) 100 MCG/2ML IJ SOLN
INTRAMUSCULAR | Status: DC | PRN
  Administered 2015-10-24 (×4): 50 ug via INTRAVENOUS

## 2015-10-24 MED ORDER — HEPARIN SODIUM (PORCINE) 1000 UNIT/ML IJ SOLN
INTRAMUSCULAR | Status: DC | PRN
  Administered 2015-10-24: 3000 [IU] via INTRAVENOUS

## 2015-10-24 MED ORDER — ALTEPLASE 2 MG IJ SOLR
INTRAMUSCULAR | Status: DC | PRN
  Administered 2015-10-24: 8 mg

## 2015-10-24 MED ORDER — CEPHALEXIN 250 MG PO CAPS
250.0000 mg | ORAL_CAPSULE | Freq: Two times a day (BID) | ORAL | Status: DC
  Filled 2015-10-24: qty 1

## 2015-10-24 MED ORDER — HEPARIN (PORCINE) IN NACL 100-0.45 UNIT/ML-% IJ SOLN
1300.0000 [IU]/h | INTRAMUSCULAR | Status: DC
  Administered 2015-10-24: 1300 [IU]/h via INTRAVENOUS
  Filled 2015-10-24: qty 250

## 2015-10-24 MED ORDER — CEFUROXIME SODIUM 1.5 G IJ SOLR
INTRAMUSCULAR | Status: AC
  Filled 2015-10-24: qty 1.5

## 2015-10-24 MED ORDER — PANTOPRAZOLE SODIUM 40 MG PO TBEC
40.0000 mg | DELAYED_RELEASE_TABLET | Freq: Two times a day (BID) | ORAL | Status: DC
  Administered 2015-10-24 – 2015-10-25 (×3): 40 mg via ORAL
  Filled 2015-10-24 (×4): qty 1

## 2015-10-24 MED ORDER — DIPHENHYDRAMINE HCL 50 MG/ML IJ SOLN
INTRAMUSCULAR | Status: DC | PRN
  Administered 2015-10-24: 50 mg via INTRAVENOUS

## 2015-10-24 MED ORDER — ONDANSETRON HCL 4 MG/2ML IJ SOLN
4.0000 mg | Freq: Four times a day (QID) | INTRAMUSCULAR | Status: DC | PRN
  Administered 2015-10-24: 4 mg via INTRAVENOUS

## 2015-10-24 MED ORDER — IOPAMIDOL (ISOVUE-300) INJECTION 61%
INTRAVENOUS | Status: DC | PRN
  Administered 2015-10-24: 30 mL via INTRAVENOUS

## 2015-10-24 MED ORDER — ALUM & MAG HYDROXIDE-SIMETH 200-200-20 MG/5ML PO SUSP: 30.0000 mL | Freq: Four times a day (QID) | ORAL | Status: DC | PRN

## 2015-10-24 MED ORDER — ONDANSETRON HCL 4 MG/2ML IJ SOLN: 4.0000 mg | Freq: Four times a day (QID) | INTRAMUSCULAR | Status: DC

## 2015-10-24 MED ORDER — DIPHENHYDRAMINE HCL 50 MG/ML IJ SOLN
INTRAMUSCULAR | Status: AC
  Filled 2015-10-24: qty 1

## 2015-10-24 MED ORDER — MIDAZOLAM HCL 2 MG/2ML IJ SOLN
INTRAMUSCULAR | Status: DC | PRN
  Administered 2015-10-24 (×3): 2 mg via INTRAVENOUS

## 2015-10-24 MED ORDER — CEFUROXIME SODIUM 1.5 G IJ SOLR
1.5000 g | Freq: Once | INTRAMUSCULAR | Status: AC
  Administered 2015-10-24: 1.5 g via INTRAVENOUS

## 2015-10-24 MED ORDER — ONDANSETRON HCL 4 MG/2ML IJ SOLN
INTRAMUSCULAR | Status: AC
  Filled 2015-10-24: qty 2

## 2015-10-24 MED ORDER — APIXABAN 2.5 MG PO TABS
2.5000 mg | ORAL_TABLET | Freq: Two times a day (BID) | ORAL | Status: DC
  Administered 2015-10-24 – 2015-10-25 (×2): 2.5 mg via ORAL
  Filled 2015-10-24 (×3): qty 1

## 2015-10-24 MED ORDER — HEPARIN SODIUM (PORCINE) 1000 UNIT/ML IJ SOLN
INTRAMUSCULAR | Status: AC
  Filled 2015-10-24: qty 1

## 2015-10-24 MED ORDER — ATORVASTATIN CALCIUM 20 MG PO TABS: 40.0000 mg | ORAL_TABLET | Freq: Every day | ORAL | Status: DC

## 2015-10-24 SURGICAL SUPPLY — 17 items
BALLN ULTRVRSE 12X80X75 (BALLOONS) ×2
BALLN ULTRVRSE 8X200X75 (BALLOONS) ×2
BALLOON ULTRVRSE 12X80X75 (BALLOONS) ×1 IMPLANT
BALLOON ULTRVRSE 8X200X75 (BALLOONS) ×1 IMPLANT
CANISTER PENUMBRA MAX (MISCELLANEOUS) ×2 IMPLANT
CANNULA 5F STIFF (CANNULA) ×2 IMPLANT
CATH INDIGO 8 XTORQ TIP 115CM (CATHETERS) ×2 IMPLANT
CATH INDIGO D 50CM (CATHETERS) ×2 IMPLANT
CATH INDIGO SEP 8 (CATHETERS) ×2 IMPLANT
CATH KA2 5FR 65CM (CATHETERS) ×2 IMPLANT
DEVICE PRESTO INFLATION (MISCELLANEOUS) ×2 IMPLANT
DEVICE SOLENT PROXI 90CM (CATHETERS) ×2 IMPLANT
PACK ANGIOGRAPHY (CUSTOM PROCEDURE TRAY) ×2 IMPLANT
SHEATH BRITE TIP 5FRX11 (SHEATH) ×2 IMPLANT
SHEATH BRITE TIP 8FRX11 (SHEATH) ×2 IMPLANT
WIRE J 3MM .035X145CM (WIRE) ×2 IMPLANT
WIRE MAGIC TOR.035 180C (WIRE) ×2 IMPLANT

## 2015-10-24 NOTE — Progress Notes (Signed)
ANTICOAGULATION CONSULT NOTE - Initial Consult  Pharmacy Consult for Heparin  Indication: VTE treatment   Allergies  Allergen Reactions  . Nodolor [Isometheptene-Dichloral-Apap] Other (See Comments)    Reaction:  Headaches     Patient Measurements: Height: 5\' 4"  (162.6 cm) Weight: 227 lb 4.7 oz (103.1 kg) IBW/kg (Calculated) : 54.7 Heparin Dosing Weight: 77  Vital Signs: Temp: 98.1 F (36.7 C) (07/06 1230) Temp Source: Oral (07/06 1230) BP: 151/65 mmHg (07/06 1230) Pulse Rate: 79 (07/06 1230)  Labs:  Recent Labs  10/23/15 2303 10/24/15 0626 10/24/15 1446  HGB  --  11.0*  --   HCT  --  33.1*  --   PLT  --  148*  --   APTT 40*  --   --   LABPROT 15.1*  --   --   INR 1.17  --   --   HEPARINUNFRC  --  0.36 0.47  CREATININE  --  6.41*  --     Estimated Creatinine Clearance: 9.7 mL/min (by C-G formula based on Cr of 6.41).   Medical History: Past Medical History  Diagnosis Date  . Osteoarthritis   . Depression   . Frequent headaches   . HTN (hypertension)   . HLD (hyperlipidemia)   . History of colon polyps 2013    colonoscopy ([name])  . History of DVT of lower extremity 2009, 2017    left sided x2, with PE s/p IVC filter placement, coumadin stopped 2/2 GI bleed  . GERD (gastroesophageal reflux disease)   . COPD (chronic obstructive pulmonary disease) (HCC)   . ESRD (end stage renal disease) (HCC) 08/2011    a. on HD (TThSa), L forearm AV fistula, [name]  . Secondary hyperparathyroidism of renal origin (HCC)   . Osteopenia 01/2013  . Bronchiectasis (HCC) 08/2014     suggested by thoracic xray  . Anemia of chronic disease   . Severe aortic stenosis     a. echo 10/2014: EF 60-65%, no RWMA, GR1DD, mod to sev AS (peak vel 377 cm/s, mean gradient 34 mm Hg, peak gradient 57 mm Hg, valve area (VTA) 0.72 cm^2  . Chronic respiratory failure (HCC)     a. 2/2 COPD; b. on 4-5L via nasal cannula  . Morbid obesity (HCC)   . GIB (gastrointestinal bleeding)      a. leading to cessation of warfarin 11/2014  . Chronic diastolic CHF (congestive heart failure) (HCC)     a. echo 10/2014: EF 60-65%, no RWMA, GR1DD, severe AS, mild MR, PASP 46 mm Hg  . Pulmonary embolism (HCC) 2009  . Renal failure     Patient has been on dialysis for " four years" -per patient.  Marland Kitchen Heart murmur   . 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 Acute DVT (deep venous thrombosis) (HCC) 2017    hospitalization    Medications:  Prescriptions prior to admission  Medication Sig Dispense Refill Last Dose  . acetaminophen (TYLENOL) 500 MG tablet Take 1,000 mg by mouth every 6 (six) hours as needed for mild pain or headache.    prn at prn  . albuterol (VENTOLIN HFA) 108 (90 BASE) MCG/ACT inhaler Inhale 2 puffs into the lungs every 4 (four) hours as needed for wheezing or shortness of breath. 18 g 3 prn at prn  . ALPRAZolam (XANAX) 1 MG tablet Take 1 mg by mouth 3 (three)  times daily as needed for anxiety.   prn at prn  . aspirin EC 325 MG EC tablet Take 1 tablet (325 mg total) by mouth daily. 30 tablet 0 10/21/2015 at 0530  . baclofen (LIORESAL) 10 MG tablet Take 0.5-1 tablets (5-10 mg total) by mouth 2 (two) times daily as needed for muscle spasms. 30 each 0 prn at prn  . calcitRIOL (ROCALTROL) 0.25 MCG capsule Take 0.25 mcg by mouth daily.   10/21/2015 at Unknown time  . calcium acetate (PHOSLO) 667 MG capsule Take 667-2,001 mg by mouth 3 (three) times daily with meals. Take 3 with meals and 1 with a snack   10/21/2015 at Unknown time  . clopidogrel (PLAVIX) 75 MG tablet Take 75 mg by mouth daily.   10/21/2015 at 0530  . cyanocobalamin (,VITAMIN B-12,) 1000 MCG/ML injection Inject 1,000 mcg into the muscle every 30 (thirty) days.   Past Month at Unknown time  . docusate sodium (COLACE) 100 MG capsule Take 100 mg by mouth 2 (two) times daily as needed for mild  constipation.   prn at prn  . donepezil (ARICEPT) 5 MG tablet Take 5 mg by mouth at bedtime.   10/20/2015 at Unknown time  . furosemide (LASIX) 20 MG tablet Take 1 tablet (20 mg total) by mouth daily. 30 tablet 6 10/21/2015 at Unknown time  . lidocaine-prilocaine (EMLA) cream Apply 1 application topically as needed (topical anesthesia for hemodialysis if Gebauers and Lidocaine injection are ineffective.). 30 g 0 prn at prn  . lovastatin (MEVACOR) 20 MG tablet Take 20 mg by mouth at bedtime.   10/20/2015 at Unknown time  . Multiple Vitamin (MULTIVITAMIN WITH MINERALS) TABS tablet Take 1 tablet by mouth daily.   10/21/2015 at Unknown time  . pantoprazole (PROTONIX) 40 MG tablet Take 1 tablet (40 mg total) by mouth daily as needed (for heartburn/indigestion). 90 tablet 1 prn at prn  . promethazine (PHENERGAN) 12.5 MG tablet Take 12.5 mg by mouth every 6 (six) hours as needed for nausea or vomiting.   prn at prn  . traZODone (DESYREL) 50 MG tablet Take 50 mg by mouth at bedtime.   3 10/20/2015 at Unknown time  . umeclidinium-vilanterol (ANORO ELLIPTA) 62.5-25 MCG/INH AEPB Inhale 1 puff into the lungs daily. 60 each 5 10/21/2015 at Unknown time   Scheduled:  . antiseptic oral rinse  7 mL Mouth Rinse BID  . apixaban  2.5 mg Oral BID  . atorvastatin  40 mg Oral q1800  . calcium acetate  2,001 mg Oral TID WC  . [START ON 10/25/2015] cephALEXin  250 mg Oral Q12H  . [START ON 11/19/2015] cyanocobalamin  1,000 mcg Intramuscular Q30 days  . donepezil  5 mg Oral QHS  . furosemide  20 mg Oral Daily  . multivitamin with minerals  1 tablet Oral Daily  . pantoprazole  40 mg Oral BID  . sodium chloride flush  3 mL Intravenous Q12H  . traZODone  50 mg Oral QHS  . umeclidinium-vilanterol  1 puff Inhalation Daily    Assessment: Pharmacy consulted to dose and monitor heparin therapy in this 70 year old female being treated for VTE.   Heparin gtt initiated at 2230 on 7/5 due to emergent HD session.   Goal of Therapy:   Heparin level 0.3-0.7 units/ml Monitor platelets by anticoagulation protocol: Yes   Plan:  Heparin level therapeutic. Will continue with current rate of 1300 units/hr.  Heparin gtt to end tonight at 2200, eliquis 2.5mg  BID will be  started at the same time.   Pharmacy will continue to monitor.  Garlon Hatchet, PharmD Clinical Pharmacist   10/24/2015

## 2015-10-24 NOTE — Progress Notes (Addendum)
ANTICOAGULATION CONSULT NOTE - Initial Consult  Pharmacy Consult for Heparin  Indication: VTE treatment   Allergies  Allergen Reactions  . Nodolor [Isometheptene-Dichloral-Apap] Other (See Comments)    Reaction:  Headaches     Patient Measurements: Height:  (162.6 cm) Weight: 227 lb 4.7 oz (103.1 kg) IBW/kg (Calculated) : 54.7 Heparin Dosing Weight: 77  Vital Signs: Temp: 97.7 F (36.5 C) (07/06 0519) Temp Source: Oral (07/06 0519) BP: 96/63 mmHg (07/06 0519) Pulse Rate: 93 (07/06 0519)  Labs:  Recent Labs  10/21/15 1611 10/23/15 2303 10/24/15 0626  HGB 11.4*  --  11.0*  HCT 34.4*  --  33.1*  PLT 169  --  148*  APTT  --  40*  --   LABPROT  --  15.1*  --   INR  --  1.17  --   HEPARINUNFRC  --   --  0.36  CREATININE 4.89*  --  6.41*    Estimated Creatinine Clearance: 9.7 mL/min (by C-G formula based on Cr of 6.41).   Medical History: Past Medical History  Diagnosis Date  . Osteoarthritis   . Depression   . Frequent headaches   . HTN (hypertension)   . HLD (hyperlipidemia)   . History of colon polyps 2013    colonoscopy ([name])  . History of DVT of lower extremity 2009, 2017    left sided x2, with PE s/p IVC filter placement, coumadin stopped 2/2 GI bleed  . GERD (gastroesophageal reflux disease)   . COPD (chronic obstructive pulmonary disease) (HCC)   . ESRD (end stage renal disease) (HCC) 08/2011    a. on HD (TThSa), L forearm AV fistula, [name]  . Secondary hyperparathyroidism of renal origin (HCC)   . Osteopenia 01/2013  . Bronchiectasis (HCC) 08/2014     suggested by thoracic xray  . Anemia of chronic disease   . Severe aortic stenosis     a. echo 10/2014: EF 60-65%, no RWMA, GR1DD, mod to sev AS (peak vel 377 cm/s, mean gradient 34 mm Hg, peak gradient 57 mm Hg, valve area (VTA) 0.72 cm^2  . Chronic respiratory failure (HCC)     a. 2/2 COPD; b. on 4-5L via nasal cannula  . Morbid obesity (HCC)   . GIB (gastrointestinal bleeding)    a. leading to cessation of warfarin 11/2014  . Chronic diastolic CHF (congestive heart failure) (HCC)     a. echo 10/2014: EF 60-65%, no RWMA, GR1DD, severe AS, mild MR, PASP 46 mm Hg  . Pulmonary embolism (HCC) 2009  . Renal failure     Patient has been on dialysis for " four years" -per patient.  Marland Kitchen Heart murmur   . 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 Acute DVT (deep venous thrombosis) (HCC) 2017    hospitalization    Medications:  Prescriptions prior to admission  Medication Sig Dispense Refill Last Dose  . acetaminophen (TYLENOL) 500 MG tablet Take 1,000 mg by mouth every 6 (six) hours as needed for mild pain or headache.    prn at prn  . albuterol (VENTOLIN HFA) 108 (90 BASE) MCG/ACT inhaler Inhale 2 puffs into the lungs every 4 (four) hours as needed for wheezing or shortness of breath. 18 g 3 prn at prn  . ALPRAZolam (XANAX) 1 MG tablet Take 1 mg by mouth 3 (three) times daily as needed for anxiety.  prn at prn  . aspirin EC 325 MG EC tablet Take 1 tablet (325 mg total) by mouth daily. 30 tablet 0 10/21/2015 at 0530  . baclofen (LIORESAL) 10 MG tablet Take 0.5-1 tablets (5-10 mg total) by mouth 2 (two) times daily as needed for muscle spasms. 30 each 0 prn at prn  . calcitRIOL (ROCALTROL) 0.25 MCG capsule Take 0.25 mcg by mouth daily.   10/21/2015 at Unknown time  . calcium acetate (PHOSLO) 667 MG capsule Take 667-2,001 mg by mouth 3 (three) times daily with meals. Take 3 with meals and 1 with a snack   10/21/2015 at Unknown time  . clopidogrel (PLAVIX) 75 MG tablet Take 75 mg by mouth daily.   10/21/2015 at 0530  . cyanocobalamin (,VITAMIN B-12,) 1000 MCG/ML injection Inject 1,000 mcg into the muscle every 30 (thirty) days.   Past Month at Unknown time  . docusate sodium (COLACE) 100 MG capsule Take 100 mg by mouth 2 (two) times daily as needed for mild  constipation.   prn at prn  . donepezil (ARICEPT) 5 MG tablet Take 5 mg by mouth at bedtime.   10/20/2015 at Unknown time  . furosemide (LASIX) 20 MG tablet Take 1 tablet (20 mg total) by mouth daily. 30 tablet 6 10/21/2015 at Unknown time  . lidocaine-prilocaine (EMLA) cream Apply 1 application topically as needed (topical anesthesia for hemodialysis if Gebauers and Lidocaine injection are ineffective.). 30 g 0 prn at prn  . lovastatin (MEVACOR) 20 MG tablet Take 20 mg by mouth at bedtime.   10/20/2015 at Unknown time  . Multiple Vitamin (MULTIVITAMIN WITH MINERALS) TABS tablet Take 1 tablet by mouth daily.   10/21/2015 at Unknown time  . pantoprazole (PROTONIX) 40 MG tablet Take 1 tablet (40 mg total) by mouth daily as needed (for heartburn/indigestion). 90 tablet 1 prn at prn  . promethazine (PHENERGAN) 12.5 MG tablet Take 12.5 mg by mouth every 6 (six) hours as needed for nausea or vomiting.   prn at prn  . traZODone (DESYREL) 50 MG tablet Take 50 mg by mouth at bedtime.   3 10/20/2015 at Unknown time  . umeclidinium-vilanterol (ANORO ELLIPTA) 62.5-25 MCG/INH AEPB Inhale 1 puff into the lungs daily. 60 each 5 10/21/2015 at Unknown time   Scheduled:  . antiseptic oral rinse  7 mL Mouth Rinse BID  . aspirin  81 mg Oral Daily  . calcium acetate  2,001 mg Oral TID WC  .  ceFAZolin (ANCEF) IV  1 g Intravenous Q8H  . clopidogrel  75 mg Oral Daily  . [START ON 11/19/2015] cyanocobalamin  1,000 mcg Intramuscular Q30 days  . donepezil  5 mg Oral QHS  . furosemide  20 mg Oral Daily  . heparin  4,600 Units Intravenous Once  . multivitamin with minerals  1 tablet Oral Daily  . pravastatin  10 mg Oral q1800  . sodium chloride flush  3 mL Intravenous Q12H  . traZODone  50 mg Oral QHS  . umeclidinium-vilanterol  1 puff Inhalation Daily    Assessment: Pharmacy consulted to dose and monitor heparin therapy in this 70 year old female being treated for VTE.   Heparin gtt initiated at 2230 on 7/5 due to emergent HD  session.   Goal of Therapy:  Heparin level 0.3-0.7 units/ml Monitor platelets by anticoagulation protocol: Yes   Plan:  7/6 HL: 0.36, therapeutic. Will continue with current rate of 1300 units/hr and recheck level in 8 hours.   Heparin gtt to end  tonight at 2200, eliquis 2.5mg  BID will be started at the same time.   Pharmacy will continue to monitor.  Roque Cash, PharmD Clinical Pharmacist  10/24/2015

## 2015-10-24 NOTE — Progress Notes (Signed)
[facility] - [place] [facility]   PATIENT NAME: Ariel Smith    MR#:  [id number]  DATE OF BIRTH:  [birth year]  SUBJECTIVE:  CHIEF COMPLAINT:   Chief Complaint  Patient presents with  . Leg Pain   - status post mechanical thrombectomy of the left femoral DVT and angioplasty of the veins. -Remains on heparin drip until this evening.  -complaints of reflux  REVIEW OF SYSTEMS:  Review of Systems  Constitutional: Negative for fever, chills and malaise/fatigue.  HENT: Negative for ear discharge, ear pain and nosebleeds.   Eyes: Negative for blurred vision and double vision.  Respiratory: Negative for cough, shortness of breath and wheezing.   Cardiovascular: Positive for leg swelling. Negative for chest pain and palpitations.  Gastrointestinal: Positive for heartburn. Negative for nausea, vomiting, abdominal pain, diarrhea and constipation.  Genitourinary: Negative for dysuria.  Musculoskeletal: Positive for myalgias.  Neurological: Negative for dizziness, sensory change, speech change, focal weakness, seizures and headaches.  Psychiatric/Behavioral: Negative for depression.    DRUG ALLERGIES:   Allergies  Allergen Reactions  . Nodolor [Isometheptene-Dichloral-Apap] Other (See Comments)    Reaction:  Headaches     VITALS:  Blood pressure 151/65, pulse 79, temperature 98.1 F (36.7 C), temperature source Oral, resp. rate 18, height 5\' 4"  (1.626 m), weight 103.1 kg (227 lb 4.7 oz), SpO2 99 %.  PHYSICAL EXAMINATION:  Physical Exam  GENERAL:  70 y.o.-year-old patient lying in the bed with no acute distress.  EYES: Pupils equal, round, reactive to light and accommodation. No scleral icterus. Extraocular muscles intact.  HEENT: Head atraumatic, normocephalic. Oropharynx and nasopharynx clear.  NECK:  Supple, no jugular venous distention. No thyroid enlargement, no tenderness.  LUNGS: Normal breath sounds bilaterally, no wheezing, rales,rhonchi or  crepitation. No use of accessory muscles of respiration. Decreased bibasilar breath sounds noted. CARDIOVASCULAR: S1, S2 normal. No murmurs, rubs, or gallops.  ABDOMEN: Soft, nontender, nondistended. Bowel sounds present. No organomegaly or mass.  EXTREMITIES: No  cyanosis, or clubbing. 1+ right leg edema, 2+ left lower extremity edema. Unna boot to left leg noted.  Palpable dorsalis pedis pulses. Right arm AV fistula present NEUROLOGIC: Cranial nerves II through XII are intact. Muscle strength 5/5 in all extremities. Sensation intact. Gait not checked.  PSYCHIATRIC: The patient is alert and oriented x 3.  SKIN: No obvious rash, lesion, or ulcer.    LABORATORY PANEL:   CBC  Recent Labs Lab 10/24/15 0626  WBC 5.5  HGB 11.0*  HCT 33.1*  PLT 148*   ------------------------------------------------------------------------------------------------------------------  Chemistries   Recent Labs Lab 10/24/15 0626  NA 138  K 5.0  CL 97*  CO2 31  GLUCOSE 103*  BUN 37*  CREATININE 6.41*  CALCIUM 9.2   ------------------------------------------------------------------------------------------------------------------  Cardiac Enzymes No results for input(s): TROPONINI in the last 168 hours. ------------------------------------------------------------------------------------------------------------------  RADIOLOGY:  No results found.  EKG:   Orders placed or performed during the hospital encounter of 10/21/15  . EKG 12-Lead  . EKG 12-Lead    ASSESSMENT AND PLAN:   70 year old female with past medical history significant for end-stage renal disease on Monday Wednesday Friday hemodialysis, history of DVT noted on anticoagulation due to GI bleed history, COPD admitted with worsening left leg swelling.  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 -Appreciate vascular consult. -Due to progression of clots recently, and since  hemoglobin is greater than 11 and no active bleeding noted-  Anticoagulation started. -Continue heparin  drip until this evening, changed eliquis 2.5 mg twice a day - patient had thrombectomy done for left common femoral vein and the other veins with the clots, and angioplasty done to improve blood flow to reduce clot  Burden  And also post-phlebitis syndrome. - #2 left lower extremity cellulitis-secondary to DVT. On Ancef for now. Changed to Keflex  #3 end-stage renal disease on Monday Wednesday Friday hemodialysis-for dialysis  Tomorrow per schedule. -Has right arm AV fistula. Appreciate nephrology consult. -Continue renal supplements  #4 COPD-on chronic home oxygen. Continue inhalers. Stable at this time.  #5 DVT prophylaxis-patient is on heparin drip    physical therapy consulted. Possible discharge tomorrow after dialysis    All the records are reviewed and case discussed with Care Management/Social Workerr. Management plans discussed with the patient, family and they are in agreement.  CODE STATUS: Full code  TOTAL TIME TAKING CARE OF THIS PATIENT: 37 minutes.   POSSIBLE D/C  tomorrow, DEPENDING ON CLINICAL CONDITION.   Haasini Patnaude M.D on 10/24/2015 at 1:47 PM  Between 7am to 6pm - Pager - [phone]  After 6pm go to [email or web address] - password [email or web address] [facility]  [facility]  Office  [phone]  CC: Primary care physician; [name], MD

## 2015-10-24 NOTE — Op Note (Signed)
[facility]   OPERATIVE NOTE   PRE-OPERATIVE DIAGNOSIS: extensive left lower extremity DVT  POST-OPERATIVE DIAGNOSIS: same   PROCEDURE: 1. US guidance for vascular access to left lesser saphenous vein 2. Catheter placement into IVC from  left lesser saphenous vein approach 3. IVC gram and left lower extremity venogram 4.   Catheter directed thrombolysis with left deep femoral vein, common femoral vein, external and common iliac vein, and to the IVC with the AngioJet proxy catheter 5. Mechanical thrombectomy to left deep femoral vein, common femoral vein, external and common iliac veins, and IVC with the pemumbra cat 8 catheter 6. PTA of left deep femoral vein and common femoral vein with 8 and 12 mm balloon 7. PTA of left external iliac vein and common iliac vein with 8 and 12 mm balloon   SURGEON: DEW,JASON, MD  ASSISTANT(S): none  ANESTHESIA: local/MCS for approximately 1 hour using 6 mg of Versed and 200 g of fentanyl  ESTIMATED BLOOD LOSS: 300 cc  FINDING(S): 1. Extensive left lower extremity DVT with significant acute component but also significant chronic component  SPECIMEN(S): none  INDICATIONS:  Patient is a 70 y.o. female who presents with extensive left lower extremity DVT and a history of factor V Leiden disease. Patient has marked leg swelling and pain. Venous intervention is performed to reduce the symtpoms and avoid long term postphlebitic symptoms.   DESCRIPTION: After obtaining full informed written consent, the patient was brought back to the vascular suite and placed supine upon the table. The patient received IV antibiotics prior to induction.Moderate conscious sedation was administered throughout a face-to-face encounter with the patient with the RN monitoring the vital signs, mental status, pulse oximetry, and telemetry under my direct supervision throughout the procedure. The patient was then placed into the  prone position. The left lesser saphenous vein was then accessed under direct ultrasound guidance without difficulty with a micropuncture needle and a permanent image was recorded. I then upsized to an 8Fr sheath over a J wire. 3000 units of heparin were then given. Imaging showed extensive DVT with minimal flow. A Kumpe catheter and Advantage wire were then advanced into the CFV and images were performed. This showed near occlusive thrombus and stenosis throughout the iliac veins with sluggish flow into the IVC were a filter was already in place. I was able to cross the thrombus and stenosis and advance into the IVC. I then used the Angiojet proxy catheter and instilled 8 mg of tpa throughout the deep femoral vein, CFV, and iliac veins and up into the IVC with the existing filter.  After this dwelled for 15 minutes, I used the Pemumbra CAT 8 catheter and evacuated about 300cc of effluent with mechanical thrombectomy throughout the iliac veins, CFV, deep femoral vein, and IVC. This had mild to moderate improvement. I then treated the deep femoral vein and common femoral vein with 8 mm diameter angioplasty balloon to open a channel.To further expand the deep femoral vein and common femoral vein gently inflated a 12 mm diameter angioplasty balloon to 4 atm as well. This resulted in some resolution of the thrombus and improved flow. I then turned my attention to the iliac veins. The stenosis/occlusion and thrombus was treated with a 8 mm diameter angioplasty balloon initially inflated to burst pressure but this was undersized.  I then upsized to a 12 mm diameter by 8 cm length angioplasty balloon and inflated this to 8 atm in the left common femoral vein, external iliac  vein, and common iliac vein up to the IVC confluence. Following these interventions there was a moderate residual chronic thrombus and stenosis in the deep femoral vein and the initial portion of the common femoral vein but there was a  reasonable channel with the more proximal common femoral vein being widely patent. The iliac veins now had a nice channel with mild to moderate residual thrombus and stenosis seen in the left common iliac vein that was less than 50% and flow now flowing into the IVC reasonably well. I then elected to terminate the procedure. The sheath was removed and a dressing was placed. She was taken to the recovery room in stable condition having tolerated the procedure well.   COMPLICATIONS: None  CONDITION: Stable  DEW,JASON 10/24/2015 9:43 AM

## 2015-10-24 NOTE — OR Nursing (Signed)
Increased itching on face around nasel cannula, pt reports she itches all the time, benedryl iv given. No rash evident

## 2015-10-24 NOTE — H&P (Signed)
  [facility] History & Physical Update  The patient was interviewed and re-examined.  The patient's previous History and Physical has been reviewed and is unchanged.  There is no change in the plan of care. We plan to proceed with the scheduled procedure.  Gabrianna Fassnacht, MD  10/24/2015, 8:10 AM

## 2015-10-24 NOTE — Progress Notes (Signed)
Pt clinically stable post procedure, awake alert and oriented,taking po's without difficulty, Dr Wyn Quaker in to speak with patient and grandaughter with questions answered, to restart heparin gtt , eliquis this pm and discharge tomorrow, denies complaints, report called to Huntley Dec RN care nurse with plan reviewed, no bleeding nor hematoma at left leg site, coban dressing intact with leg elevated on pillow,

## 2015-10-24 NOTE — Progress Notes (Signed)
[facility]  ROUNDING NOTE   Subjective:  Patient status post partial thrombectomy of left lower extremity DVT. Patient due for hemodialysis tomorrow. Overall in good spirits.  Objective:  Vital signs in last 24 hours:  Temp:  [97.7 F (36.5 C)-98.7 F (37.1 C)] 98.1 F (36.7 C) (07/06 1230) Pulse Rate:  [72-94] 79 (07/06 1230) Resp:  [11-21] 18 (07/06 1230) BP: (87-151)/(20-92) 151/65 mmHg (07/06 1230) SpO2:  [95 %-100 %] 99 % (07/06 1230) FiO2 (%):  [93 %-100 %] 93 % (07/06 0901) Weight:  [103.1 kg (227 lb 4.7 oz)] 103.1 kg (227 lb 4.7 oz) (07/05 1750)  Weight change:  Filed Weights   10/21/15 1538 10/23/15 1425 10/23/15 1750  Weight: 97.523 kg (215 lb) 105.2 kg (231 lb 14.8 oz) 103.1 kg (227 lb 4.7 oz)    Intake/Output: I/O last 3 completed shifts: In: 393 [P.O.:240; I.V.:3; IV Piggyback:150] Out: 2000 [Other:2000]   Intake/Output this shift:  Total I/O In: 640.8 [P.O.:480; I.V.:160.8] Out: 200 [Emesis/NG output:200]  Physical Exam: General: NAD, resting in bed  Head: Normocephalic, atraumatic. Moist oral mucosal membranes  Eyes: Anicteric, PERRL  Neck: Supple, trachea midline  Lungs:  Clear to auscultation  Heart: Regular rate and rhythm  Abdomen:  Soft, nontender,   Extremities: LLE edema noted, swelling and tenderness noted  Neurologic: Nonfocal, moving all four extremities  Skin: No lesions  Access: LUE AVF    Basic Metabolic Panel:  Recent Labs Lab 10/21/15 1611 10/23/15 1330 10/24/15 0626  NA 139  --  138  K 4.2  --  5.0  CL 97*  --  97*  CO2 30  --  31  GLUCOSE 98  --  103*  BUN 21*  --  37*  CREATININE 4.89*  --  6.41*  CALCIUM 9.2  --  9.2  PHOS  --  8.0*  --     Liver Function Tests: No results for input(s): AST, ALT, ALKPHOS, BILITOT, PROT, ALBUMIN in the last 168 hours. No results for input(s): LIPASE, AMYLASE in the last 168 hours. No results for input(s): AMMONIA in the last 168 hours.  CBC:  Recent Labs Lab  10/21/15 1611 10/24/15 0626  WBC 7.3 5.5  NEUTROABS 5.7  --   HGB 11.4* 11.0*  HCT 34.4* 33.1*  MCV 100.6* 100.2*  PLT 169 148*    Cardiac Enzymes: No results for input(s): CKTOTAL, CKMB, CKMBINDEX, TROPONINI in the last 168 hours.  BNP: Invalid input(s): POCBNP  CBG: No results for input(s): GLUCAP in the last 168 hours.  Microbiology: Results for orders placed or performed during the hospital encounter of 10/21/15  Culture, blood (routine x 2)     Status: None (Preliminary result)   Collection Time: 10/21/15  4:00 PM  Result Value Ref Range Status   Specimen Description BLOOD RIGHT HAND  Final   Special Requests   Final    BOTTLES DRAWN AEROBIC AND ANAEROBIC  AERO 2CC ANA 3CC   Culture NO GROWTH 3 DAYS  Final   Report Status PENDING  Incomplete  Culture, blood (routine x 2)     Status: None (Preliminary result)   Collection Time: 10/21/15  4:11 PM  Result Value Ref Range Status   Specimen Description BLOOD RIGHT ASSIST CONTROL  Final   Special Requests   Final    BOTTLES DRAWN AEROBIC AND ANAEROBIC  AERO 14CC ANA 10CC   Culture NO GROWTH 3 DAYS  Final   Report Status PENDING  Incomplete  MRSA PCR  Screening     Status: None   Collection Time: 10/22/15  5:21 AM  Result Value Ref Range Status   MRSA by PCR NEGATIVE NEGATIVE Final    Comment:        The GeneXpert MRSA Assay (FDA approved for NASAL specimens only), is one component of a comprehensive MRSA colonization surveillance program. It is not intended to diagnose MRSA infection nor to guide or monitor treatment for MRSA infections.   Surgical pcr screen     Status: None   Collection Time: 10/23/15  9:27 PM  Result Value Ref Range Status   MRSA, PCR NEGATIVE NEGATIVE Final   Staphylococcus aureus NEGATIVE NEGATIVE Final    Comment:        The Xpert SA Assay (FDA approved for NASAL specimens in patients over 62 years of age), is one component of a comprehensive surveillance program.  Test performance  has been validated by [facility] for patients greater than or equal to 94 year old. It is not intended to diagnose infection nor to guide or monitor treatment.     Coagulation Studies:  Recent Labs  10/23/15 2303  LABPROT 15.1*  INR 1.17    Urinalysis: No results for input(s): COLORURINE, LABSPEC, PHURINE, GLUCOSEU, HGBUR, BILIRUBINUR, KETONESUR, PROTEINUR, UROBILINOGEN, NITRITE, LEUKOCYTESUR in the last 72 hours.  Invalid input(s): APPERANCEUR    Imaging: No results found.   Medications:   . heparin 1,300 Units/hr (10/24/15 1208)   . antiseptic oral rinse  7 mL Mouth Rinse BID  . apixaban  2.5 mg Oral BID  . atorvastatin  40 mg Oral q1800  . calcium acetate  2,001 mg Oral TID WC  . [START ON 10/25/2015] cephALEXin  250 mg Oral Q12H  . [START ON 11/19/2015] cyanocobalamin  1,000 mcg Intramuscular Q30 days  . donepezil  5 mg Oral QHS  . furosemide  20 mg Oral Daily  . multivitamin with minerals  1 tablet Oral Daily  . pantoprazole  40 mg Oral BID  . sodium chloride flush  3 mL Intravenous Q12H  . traZODone  50 mg Oral QHS  . umeclidinium-vilanterol  1 puff Inhalation Daily   sodium chloride, acetaminophen, albuterol, ALPRAZolam, alum & mag hydroxide-simeth, docusate sodium, oxyCODONE, sodium chloride flush  Assessment/ Plan:  70 y.o. female with pmhx of ESRD MWF, SHPTH, AOCD, HTN, hyperlipidemia, COPD, DVT with PE, tx with long term coumadin and IVC filter placement, L knee TKA, R shoulder surgery, IVC filter removed Aug 2014  [address]/MWF/[facility]  1.  End-stage renal disease on HD MWF: Patient had a urinalysis yesterday.  No indication for dialysis today.  We will plan for dialysis again tomorrow.  2.  Anemia chronic kidney disease.  Hemoglobin currently 11.0.  Hold off on Epogen for now.  3.  Secondary hyperparathyroidism.  Phosphorus was high yesterday at 8.0.  Recheck phosphorus tomorrow.  Continue calcium acetate 2001 mg by mouth 3 times a day with  meals.  4.  Left lower extremity DVT.  Patient status post mechanical thrombectomy of left femoral DVT.  Patient on heparin drip.   LOS: 3 Ali Mclaurin 7/6/20173:36 PM

## 2015-10-24 NOTE — Progress Notes (Signed)
PT Cancellation Note  Patient Details Name: Ariel Smith MRN: [id number] DOB: [birth year]   Cancelled Treatment:    Reason Eval/Treat Not Completed: Patient at procedure or test/unavailable.  Try later as time and pt allow.   Ivar Drape 10/24/2015, 9:48 AM    Samul Dada, PT MS Acute Rehab Dept. Number: [facility] [id number] and [facility] [phone]

## 2015-10-25 ENCOUNTER — Encounter: Payer: Self-pay | Admitting: Vascular Surgery

## 2015-10-25 ENCOUNTER — Inpatient Hospital Stay: Payer: Medicare Other | Admitting: Hematology and Oncology

## 2015-10-25 LAB — BASIC METABOLIC PANEL
Anion gap: 11 (ref 5–15)
BUN: 58 mg/dL — AB (ref 6–20)
CALCIUM: 9.2 mg/dL (ref 8.9–10.3)
CO2: 30 mmol/L (ref 22–32)
CREATININE: 8.34 mg/dL — AB (ref 0.44–1.00)
Chloride: 95 mmol/L — ABNORMAL LOW (ref 101–111)
GFR calc Af Amer: 5 mL/min — ABNORMAL LOW (ref >60)
GFR, EST NON AFRICAN AMERICAN: 4 mL/min — AB (ref >60)
GLUCOSE: 97 mg/dL (ref 65–99)
Potassium: 5.6 mmol/L — ABNORMAL HIGH (ref 3.5–5.1)
SODIUM: 136 mmol/L (ref 135–145)

## 2015-10-25 LAB — HEPATITIS B SURFACE ANTIGEN: HEP B S AG: NEGATIVE

## 2015-10-25 LAB — PHOSPHORUS: PHOSPHORUS: 4.6 mg/dL (ref 2.5–4.6)

## 2015-10-25 MED ORDER — OXYCODONE HCL 5 MG PO TABS: 5.0000 mg | ORAL_TABLET | ORAL | Status: DC | PRN

## 2015-10-25 MED ORDER — ATORVASTATIN CALCIUM 40 MG PO TABS: 40.0000 mg | ORAL_TABLET | Freq: Every day | ORAL | Status: DC

## 2015-10-25 MED ORDER — PANTOPRAZOLE SODIUM 40 MG PO TBEC: 40.0000 mg | DELAYED_RELEASE_TABLET | Freq: Two times a day (BID) | ORAL | Status: DC

## 2015-10-25 MED ORDER — MIDODRINE HCL 5 MG PO TABS
5.0000 mg | ORAL_TABLET | Freq: Once | ORAL | Status: AC
  Administered 2015-10-25: 5 mg via ORAL
  Filled 2015-10-25: qty 1

## 2015-10-25 MED ORDER — APIXABAN 2.5 MG PO TABS: 2.5000 mg | ORAL_TABLET | Freq: Two times a day (BID) | ORAL | Status: DC

## 2015-10-25 MED ORDER — POLYETHYLENE GLYCOL 3350 17 G PO PACK
17.0000 g | PACK | Freq: Every day | ORAL | Status: DC
  Administered 2015-10-25: 17 g via ORAL
  Filled 2015-10-25 (×2): qty 1

## 2015-10-25 MED ORDER — BISACODYL 5 MG PO TBEC
10.0000 mg | DELAYED_RELEASE_TABLET | Freq: Every day | ORAL | Status: DC | PRN
  Administered 2015-10-25: 10 mg via ORAL
  Filled 2015-10-25: qty 2

## 2015-10-25 NOTE — Progress Notes (Signed)
[facility]  ROUNDING NOTE   Subjective:  Patient due for hemodialysis today. Less swelling noted in the left lower extremity post thrombectomy.  Objective:  Vital signs in last 24 hours:  Temp:  [89.9 F (32.2 C)-98.5 F (36.9 C)] 89.9 F (32.2 C) (07/07 1115) Pulse Rate:  [82-93] 93 (07/07 1300) Resp:  [13-19] 13 (07/07 1300) BP: (74-94)/(35-63) 87/47 mmHg (07/07 1300) SpO2:  [91 %-100 %] 94 % (07/07 1135)  Weight change:  Filed Weights   10/21/15 1538 10/23/15 1425 10/23/15 1750  Weight: 97.523 kg (215 lb) 105.2 kg (231 lb 14.8 oz) 103.1 kg (227 lb 4.7 oz)    Intake/Output: I/O last 3 completed shifts: In: 952.1 [P.O.:660; I.V.:192.1; IV Piggyback:100] Out: 550 [Emesis/NG output:550]   Intake/Output this shift:     Physical Exam: General: NAD, resting in bed  Head: Normocephalic, atraumatic. Moist oral mucosal membranes  Eyes: Anicteric, PERRL  Neck: Supple, trachea midline  Lungs:  Clear to auscultation  Heart: Regular rate and rhythm  Abdomen:  Soft, nontender,   Extremities: LLE edema noted, decreased swelling  Neurologic: Nonfocal, moving all four extremities  Skin: No lesions  Access: LUE AVF    Basic Metabolic Panel:  Recent Labs Lab 10/21/15 1611 10/23/15 1330 10/24/15 0626 10/25/15 0452 10/25/15 1128  NA 139  --  138 136  --   K 4.2  --  5.0 5.6*  --   CL 97*  --  97* 95*  --   CO2 30  --  31 30  --   GLUCOSE 98  --  103* 97  --   BUN 21*  --  37* 58*  --   CREATININE 4.89*  --  6.41* 8.34*  --   CALCIUM 9.2  --  9.2 9.2  --   PHOS  --  8.0*  --   --  4.6    Liver Function Tests: No results for input(s): AST, ALT, ALKPHOS, BILITOT, PROT, ALBUMIN in the last 168 hours. No results for input(s): LIPASE, AMYLASE in the last 168 hours. No results for input(s): AMMONIA in the last 168 hours.  CBC:  Recent Labs Lab 10/21/15 1611 10/24/15 0626  WBC 7.3 5.5  NEUTROABS 5.7  --   HGB 11.4* 11.0*  HCT 34.4* 33.1*  MCV  100.6* 100.2*  PLT 169 148*    Cardiac Enzymes: No results for input(s): CKTOTAL, CKMB, CKMBINDEX, TROPONINI in the last 168 hours.  BNP: Invalid input(s): POCBNP  CBG: No results for input(s): GLUCAP in the last 168 hours.  Microbiology: Results for orders placed or performed during the hospital encounter of 10/21/15  Culture, blood (routine x 2)     Status: None (Preliminary result)   Collection Time: 10/21/15  4:00 PM  Result Value Ref Range Status   Specimen Description BLOOD RIGHT HAND  Final   Special Requests   Final    BOTTLES DRAWN AEROBIC AND ANAEROBIC  AERO 2CC ANA 3CC   Culture NO GROWTH 4 DAYS  Final   Report Status PENDING  Incomplete  Culture, blood (routine x 2)     Status: None (Preliminary result)   Collection Time: 10/21/15  4:11 PM  Result Value Ref Range Status   Specimen Description BLOOD RIGHT ASSIST CONTROL  Final   Special Requests   Final    BOTTLES DRAWN AEROBIC AND ANAEROBIC  AERO 14CC ANA 10CC   Culture NO GROWTH 4 DAYS  Final   Report Status PENDING  Incomplete  MRSA  PCR Screening     Status: None   Collection Time: 10/22/15  5:21 AM  Result Value Ref Range Status   MRSA by PCR NEGATIVE NEGATIVE Final    Comment:        The GeneXpert MRSA Assay (FDA approved for NASAL specimens only), is one component of a comprehensive MRSA colonization surveillance program. It is not intended to diagnose MRSA infection nor to guide or monitor treatment for MRSA infections.   Surgical pcr screen     Status: None   Collection Time: 10/23/15  9:27 PM  Result Value Ref Range Status   MRSA, PCR NEGATIVE NEGATIVE Final   Staphylococcus aureus NEGATIVE NEGATIVE Final    Comment:        The Xpert SA Assay (FDA approved for NASAL specimens in patients over 22 years of age), is one component of a comprehensive surveillance program.  Test performance has been validated by [facility] for patients greater than or equal to 68 year old. It is not  intended to diagnose infection nor to guide or monitor treatment.     Coagulation Studies:  Recent Labs  10/23/15 2303  LABPROT 15.1*  INR 1.17    Urinalysis: No results for input(s): COLORURINE, LABSPEC, PHURINE, GLUCOSEU, HGBUR, BILIRUBINUR, KETONESUR, PROTEINUR, UROBILINOGEN, NITRITE, LEUKOCYTESUR in the last 72 hours.  Invalid input(s): APPERANCEUR    Imaging: No results found.   Medications:     . antiseptic oral rinse  7 mL Mouth Rinse BID  . apixaban  2.5 mg Oral BID  . atorvastatin  40 mg Oral q1800  . calcium acetate  2,001 mg Oral TID WC  . cephALEXin  250 mg Oral Q12H  . [START ON 11/19/2015] cyanocobalamin  1,000 mcg Intramuscular Q30 days  . donepezil  5 mg Oral QHS  . furosemide  20 mg Oral Daily  . multivitamin with minerals  1 tablet Oral Daily  . pantoprazole  40 mg Oral BID  . polyethylene glycol  17 g Oral Daily  . sodium chloride flush  3 mL Intravenous Q12H  . traZODone  50 mg Oral QHS  . umeclidinium-vilanterol  1 puff Inhalation Daily   sodium chloride, acetaminophen, albuterol, ALPRAZolam, alum & mag hydroxide-simeth, bisacodyl, docusate sodium, ondansetron (ZOFRAN) IV, oxyCODONE, sodium chloride flush  Assessment/ Plan:  70 y.o. female with pmhx of ESRD MWF, SHPTH, AOCD, HTN, hyperlipidemia, COPD, DVT with PE, tx with long term coumadin and IVC filter placement, L knee TKA, R shoulder surgery, IVC filter removed Aug 2014  [address]/MWF/[facility]  1.  End-stage renal disease on HD MWF: atient due for hemodialysis today.  Orders have been prepared.  2.  Anemia chronic kidney disease.  Continue to hold Epogen.  3.  Secondary hyperparathyroidism.  Phosphorus down to 4.6.  Continue calcium acetate 3 tablets by mouth 3 times a day with meals.  4.  Left lower extremity DVT.  Patient status post mechanical thrombectomy of left femoral DVT.  Pt started on apixiban.   LOS: 4 Yobana Culliton 7/7/20171:17 PM

## 2015-10-25 NOTE — Progress Notes (Signed)
HD STARTED  

## 2015-10-25 NOTE — Progress Notes (Signed)
POST DIALYSIS ASSESSMENT 

## 2015-10-25 NOTE — Evaluation (Signed)
Physical Therapy Evaluation Patient Details Name: Ariel Smith MRN: [id number] DOB: Sep 16, 1945 Today's Date: 10/25/2015   History of Present Illness  Ariel Smith is a 70yo white female who comes to [facility] on 7/3 after worsening LLE edema. She was found to have a new superficial venous thrombosis. The pt underwne tvascular procedure on 7/6. PMH includes chronic LLE DVT with IVC placement (poor tolerance to anticoagulation), PE, TAVR, ESRD on HD MWF.   Clinical Impression  Upon entry, the patient is received semirecumbent in bed, husband at bedside. The pt is awake and agreeable to participate. No acute distress noted at this time, only mild soreness from procedure yesterday. Vitals are orthostatic throughout session, but pt denies symptoms commonly associated. The pt is alert and oriented x3, pleasant, conversational, and following simple and multi-step commands consistently. Pt received on and remaining on 3L O2 throughout evaluation, with noted saturation of >97%, and this is O2 baseline at home. Functional mobility assessment demonstrates moderate weakness, the pt now requiring moderate effort to perform transfers and gait; she reports she feels very deconditioned compared to her baseline.   Patient presenting with impairment of strength, hemodynamics, and activity tolerance, limiting ability to perform ADL and mobility tasks at  baseline level of function. Patient will benefit from skilled intervention to address the above impairments and limitations, in order to restore to prior level of function, improve patient safety upon discharge, and to decrease falls risk.      Follow Up Recommendations Home health PT (Pt is appropriate for HHPT , but has had it in the psat and says she can do this on her own without their help. )    Equipment Recommendations  None recommended by PT    Recommendations for Other Services       Precautions / Restrictions Precautions Precautions: Fall       Mobility  Bed Mobility Overal bed mobility: Independent                Transfers Overall transfer level: Modified independent Equipment used: Rolling walker (2 wheeled)             General transfer comment: RW, additional effort.   Ambulation/Gait Ambulation/Gait assistance: Min guard Ambulation Distance (Feet): 100 Feet Assistive device: Rolling walker (2 wheeled)     Gait velocity interpretation: <1.8 ft/sec, indicative of risk for recurrent falls General Gait Details: alternates 10' amb:10retroamb for 100 cumulative feet, slow andsteady, denies dizziness or visual changes, although is orthostatic throughout.   Stairs            Wheelchair Mobility    Modified Rankin (Stroke Patients Only)       Balance Overall balance assessment: Modified Independent;No apparent balance deficits (not formally assessed)                                           Pertinent Vitals/Pain Pain Assessment: 0-10 Pain Score: 6  Pain Location: LLE, only hurts after walking Pain Descriptors / Indicators: Aching Pain Intervention(s): Limited activity within patient's tolerance;Monitored during session    Home Living Family/patient expects to be discharged to:: Private residence Living Arrangements: Spouse/significant other Available Help at Discharge: Family;Available 24 hours/day Type of Home: Mobile home Home Access: Ramped entrance     Home Layout: One level Home Equipment: Cane - single point;Walker - 2 wheels;Walker - 4 wheels;Shower seat;Bedside commode Additional Comments: Home O2 (3L  at baseline)    Prior Function Level of Independence: Independent with assistive device(s)         Comments: Independent with household amb vs SPC and rollator for community ambulation. Husband transports to/from dialysis.     Hand Dominance        Extremity/Trunk Assessment   Upper Extremity Assessment: Generalized weakness           Lower  Extremity Assessment: Generalized weakness         Communication   Communication: No difficulties  Cognition Arousal/Alertness: Awake/alert Behavior During Therapy: WFL for tasks assessed/performed Overall Cognitive Status: Within Functional Limits for tasks assessed                      General Comments      Exercises        Assessment/Plan    PT Assessment Patient needs continued PT services  PT Diagnosis Difficulty walking;Generalized weakness   PT Problem List Decreased strength;Pain;Decreased activity tolerance;Decreased mobility;Decreased coordination;Obesity;Cardiopulmonary status limiting activity  PT Treatment Interventions Functional mobility training;Therapeutic activities;Therapeutic exercise;Patient/family education   PT Goals (Current goals can be found in the Care Plan section) Acute Rehab PT Goals Patient Stated Goal: Regain strength and return to home.  PT Goal Formulation: With patient Time For Goal Achievement: 11/08/15 Potential to Achieve Goals: Good    Frequency Min 2X/week   Barriers to discharge        Co-evaluation               End of Session Equipment Utilized During Treatment: Gait belt;Oxygen Activity Tolerance: Patient tolerated treatment well;Patient limited by fatigue;Patient limited by pain Patient left: in bed;with call bell/phone within reach;with family/visitor present;Other (comment) (eating breasfast at EOB. ) Nurse Communication: Mobility status;Other (comment) (orthostasis)         Time: 4628-6381 PT Time Calculation (min) (ACUTE ONLY): 30 min   Charges:   PT Evaluation $PT Eval High Complexity: 1 Procedure PT Treatments $Therapeutic Activity: 8-22 mins   PT G Codes:       9:51 AM, 2015-11-04 Rosamaria Lints, PT, DPT Physical Therapist - [place] [phone] (mobile)

## 2015-10-25 NOTE — Progress Notes (Signed)
Alert and oriented. Vital signs stable post dialysis. No signs of acute distress. Discharge instructions given. Patient verbalizes understanding. No other issues noted at this time.

## 2015-10-25 NOTE — Care Management (Signed)
Patient admitted for acute on chronic DVT of the left leg-patient already with previous history of IVC filter.  Patient to discharge on Eliquis.  I have provided the patient with a 30 day trail coupon.  Plan for patient to discharge after dialysis.  MD to order home health PT and RN.  Patient was offered agency preference.  She states that she has used [facility] in the past and would like to use them again.  I have notified Barbara Cower with [facility] and given him the referral . Patient has chronic O2 through [employer], and portable tank is at bedside.  Patient has RW, cane, and shower chair in the home.  Husband to transport at discharge.  RNCM signing off.

## 2015-10-25 NOTE — Care Management Important Message (Signed)
Important Message  Patient Details  Name: Ariel Smith MRN: [id number] Date of Birth: Mar 14, 1946   Medicare Important Message Given:  Yes    Olegario Messier A Ronnetta Currington 10/25/2015, 11:15 AM

## 2015-10-26 LAB — CULTURE, BLOOD (ROUTINE X 2)
Culture: NO GROWTH
Culture: NO GROWTH

## 2015-10-26 LAB — HEPATITIS B SURFACE ANTIGEN: HEP B S AG: NEGATIVE

## 2015-10-31 NOTE — Discharge Summary (Signed)
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 Osteoarthritis   . Depression   . Frequent headaches   . HTN (hypertension)   . HLD (hyperlipidemia)   . History of colon polyps 2013    colonoscopy ([name])  . History of DVT of lower extremity 2009, 2017    left sided x2, with PE s/p IVC filter placement, coumadin stopped 2/2 GI bleed  . GERD (gastroesophageal reflux disease)   . COPD (chronic obstructive pulmonary disease) (HCC)   . ESRD (end stage renal disease) (HCC) 08/2011    a. on HD (TThSa), L forearm AV fistula, [name]  . Secondary hyperparathyroidism of renal origin (HCC)   . Osteopenia 01/2013  . Bronchiectasis (HCC) 08/2014     suggested by thoracic xray  . Anemia of chronic disease   . Severe aortic stenosis     a. echo 10/2014: EF 60-65%, no RWMA, GR1DD, mod to sev AS (peak vel 377 cm/s, mean gradient 34 mm Hg, peak gradient 57 mm Hg, valve area (VTA) 0.72 cm^2  . Chronic respiratory failure (HCC)     a. 2/2 COPD; b. on 4-5L via nasal cannula  . Morbid obesity (HCC)   . GIB (gastrointestinal bleeding)     a. leading to cessation of warfarin 11/2014  . Chronic diastolic CHF (congestive heart failure) (HCC)     a. echo 10/2014: EF 60-65%, no RWMA, GR1DD, severe AS, mild MR, PASP 46 mm Hg  . Pulmonary embolism (HCC) 2009  . Renal failure     Patient has been on  dialysis for " four years" -per patient.  Marland Kitchen Heart murmur   . 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 Acute DVT (deep venous thrombosis) (HCC) 2017    hospitalization    HOSPITAL COURSE:   70 year old female with past medical history significant for end-stage renal disease on Monday Wednesday Friday hemodialysis, history of DVT noted on anticoagulation due to GI bleed history, COPD admitted with worsening left leg swelling.  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 -Appreciate vascular consult. -Due to progression of clots recently, and since hemoglobin is greater than 11 and no active bleeding noted- Anticoagulation started.  - received heparin drip in hospital and discharged on 2.5mg  BID eliquis - patient had thrombectomy done for left common femoral vein and the other veins with the clots, and angioplasty done to improve blood flow to reduce clot Burden And also post-phlebitis syndrome. - #2 left lower extremity cellulitis-secondary to DVT. Was on Ancef in hospital. Changed to Keflex Much improved symptoms  #3 end-stage renal disease on Monday Wednesday Friday hemodialysis- received dialysis here per schedule. -Has right arm AV fistula. Appreciate nephrology consult. -Continue renal supplements  #4 COPD-on  chronic home oxygen. Continue inhalers. Stable at this time.  Physical therapy recommended home health.   DISCHARGE CONDITIONS:   Guarded  CONSULTS OBTAINED:  Treatment Team:  Mady Haagensen, MD Renford Dills, MD  DRUG ALLERGIES:   Allergies  Allergen Reactions  . Nodolor [Isometheptene-Dichloral-Apap] Other (See Comments)    Reaction:  Headaches     DISCHARGE MEDICATIONS:   Discharge Medication List as of 10/25/2015  4:10 PM    START taking these medications   Details  apixaban  (ELIQUIS) 2.5 MG TABS tablet Take 1 tablet (2.5 mg total) by mouth 2 (two) times daily., Starting 10/25/2015, Until Discontinued, Normal    atorvastatin (LIPITOR) 40 MG tablet Take 1 tablet (40 mg total) by mouth daily at 6 PM., Starting 10/25/2015, Until Discontinued, Normal    oxyCODONE (OXY IR/ROXICODONE) 5 MG immediate release tablet Take 1 tablet (5 mg total) by mouth every 4 (four) hours as needed for moderate pain., Starting 10/25/2015, Until Discontinued, Print      CONTINUE these medications which have CHANGED   Details  pantoprazole (PROTONIX) 40 MG tablet Take 1 tablet (40 mg total) by mouth 2 (two) times daily., Starting 10/25/2015, Until Discontinued, Normal      CONTINUE these medications which have NOT CHANGED   Details  acetaminophen (TYLENOL) 500 MG tablet Take 1,000 mg by mouth every 6 (six) hours as needed for mild pain or headache. , Until Discontinued, Historical Med    albuterol (VENTOLIN HFA) 108 (90 BASE) MCG/ACT inhaler Inhale 2 puffs into the lungs every 4 (four) hours as needed for wheezing or shortness of breath., Starting 04/08/2015, Until Discontinued, Normal    ALPRAZolam (XANAX) 1 MG tablet Take 1 mg by mouth 3 (three) times daily as needed for anxiety., Until Discontinued, Historical Med    calcitRIOL (ROCALTROL) 0.25 MCG capsule Take 0.25 mcg by mouth daily., Until Discontinued, Historical Med    calcium acetate (PHOSLO) 667 MG capsule Take 667-2,001 mg by mouth 3 (three) times daily with meals. Take 3 with meals and 1 with a snack, Until Discontinued, Historical Med    clopidogrel (PLAVIX) 75 MG tablet Take 75 mg by mouth daily., Until Discontinued, Historical Med    cyanocobalamin (,VITAMIN B-12,) 1000 MCG/ML injection Inject 1,000 mcg into the muscle every 30 (thirty) days., Until Discontinued, Historical Med    docusate sodium (COLACE) 100 MG capsule Take 100 mg by mouth 2 (two) times daily as needed for mild constipation., Until Discontinued, Historical Med     donepezil (ARICEPT) 5 MG tablet Take 5 mg by mouth at bedtime., Until Discontinued, Historical Med    furosemide (LASIX) 20 MG tablet Take 1 tablet (20 mg total) by mouth daily., Starting 05/13/2015, Until Discontinued, Normal    lidocaine-prilocaine (EMLA) cream Apply 1 application topically as needed (topical anesthesia for hemodialysis if Gebauers and Lidocaine injection are ineffective.)., Starting 11/22/2014, Until Discontinued, Normal    Multiple Vitamin (MULTIVITAMIN WITH MINERALS) TABS tablet Take 1 tablet by mouth daily., Until Discontinued, Historical Med    promethazine (PHENERGAN) 12.5 MG tablet Take 12.5 mg by mouth every 6 (six) hours as needed for nausea or vomiting., Until Discontinued, Historical Med    traZODone (DESYREL) 50 MG tablet Take 50 mg by mouth at bedtime. , Starting 09/19/2015, Until Discontinued, Historical Med    umeclidinium-vilanterol (ANORO ELLIPTA) 62.5-25 MCG/INH AEPB Inhale 1 puff into the lungs daily., Starting 09/26/2015, Until Discontinued, Normal      STOP taking these medications     aspirin EC  325 MG EC tablet      baclofen (LIORESAL) 10 MG tablet      lovastatin (MEVACOR) 20 MG tablet          DISCHARGE INSTRUCTIONS:   1. PCP f/u in 1-2 weeks 2. For dialysis per schedule 3. Vascular f/u in 3-4 weeks  If you experience worsening of your admission symptoms, develop shortness of breath, life threatening emergency, suicidal or homicidal thoughts you must seek medical attention immediately by calling 911 or calling your MD immediately  if symptoms less severe.  You Must read complete instructions/literature along with all the possible adverse reactions/side effects for all the Medicines you take and that have been prescribed to you. Take any new Medicines after you have completely understood and accept all the possible adverse reactions/side effects.   Please note  You were cared for by a hospitalist during your hospital stay. If you have  any questions about your discharge medications or the care you received while you were in the hospital after you are discharged, you can call the unit and asked to speak with the hospitalist on call if the hospitalist that took care of you is not available. Once you are discharged, your primary care physician will handle any further medical issues. Please note that NO REFILLS for any discharge medications will be authorized once you are discharged, as it is imperative that you return to your primary care physician (or establish a relationship with a primary care physician if you do not have one) for your aftercare needs so that they can reassess your need for medications and monitor your lab values.    Today   CHIEF COMPLAINT:   Chief Complaint  Patient presents with  . Leg Pain    VITAL SIGNS:  Blood pressure 105/62, pulse 101, temperature 98.4 F (36.9 C), temperature source Oral, resp. rate 18, height 5\' 4"  (1.626 m), weight 104.4 kg (230 lb 2.6 oz), SpO2 92 %.  I/O:  No intake or output data in the 24 hours ending 10/31/15 0721  PHYSICAL EXAMINATION:   Physical Exam  GENERAL: 70 y.o.-year-old patient lying in the bed with no acute distress.  EYES: Pupils equal, round, reactive to light and accommodation. No scleral icterus. Extraocular muscles intact.  HEENT: Head atraumatic, normocephalic. Oropharynx and nasopharynx clear.  NECK: Supple, no jugular venous distention. No thyroid enlargement, no tenderness.  LUNGS: Normal breath sounds bilaterally, no wheezing, rales,rhonchi or crepitation. No use of accessory muscles of respiration. Decreased bibasilar breath sounds noted. CARDIOVASCULAR: S1, S2 normal. No murmurs, rubs, or gallops.  ABDOMEN: Soft, nontender, nondistended. Bowel sounds present. No organomegaly or mass.  EXTREMITIES: No cyanosis, or clubbing. 1+ right leg edema, 2+ left lower extremity edema. Much improved now Unna boot to left leg noted. Palpable  dorsalis pedis pulses. Right arm AV fistula present NEUROLOGIC: Cranial nerves II through XII are intact. Muscle strength 5/5 in all extremities. Sensation intact. Gait not checked.  PSYCHIATRIC: The patient is alert and oriented x 3.  SKIN: No obvious rash, lesion, or ulcer.   DATA REVIEW:   CBC No results for input(s): WBC, HGB, HCT, PLT in the last 168 hours.  Chemistries   Recent Labs Lab 10/25/15 0452  NA 136  K 5.6*  CL 95*  CO2 30  GLUCOSE 97  BUN 58*  CREATININE 8.34*  CALCIUM 9.2    Cardiac Enzymes No results for input(s): TROPONINI in the last 168 hours.  Microbiology Results  Results for orders placed or performed  during the hospital encounter of 10/21/15  Culture, blood (routine x 2)     Status: None   Collection Time: 10/21/15  4:00 PM  Result Value Ref Range Status   Specimen Description BLOOD RIGHT HAND  Final   Special Requests   Final    BOTTLES DRAWN AEROBIC AND ANAEROBIC  AERO 2CC ANA 3CC   Culture NO GROWTH 5 DAYS  Final   Report Status 10/26/2015 FINAL  Final  Culture, blood (routine x 2)     Status: None   Collection Time: 10/21/15  4:11 PM  Result Value Ref Range Status   Specimen Description BLOOD RIGHT ASSIST CONTROL  Final   Special Requests   Final    BOTTLES DRAWN AEROBIC AND ANAEROBIC  AERO 14CC ANA 10CC   Culture NO GROWTH 5 DAYS  Final   Report Status 10/26/2015 FINAL  Final  MRSA PCR Screening     Status: None   Collection Time: 10/22/15  5:21 AM  Result Value Ref Range Status   MRSA by PCR NEGATIVE NEGATIVE Final    Comment:        The GeneXpert MRSA Assay (FDA approved for NASAL specimens only), is one component of a comprehensive MRSA colonization surveillance program. It is not intended to diagnose MRSA infection nor to guide or monitor treatment for MRSA infections.   Surgical pcr screen     Status: None   Collection Time: 10/23/15  9:27 PM  Result Value Ref Range Status   MRSA, PCR NEGATIVE NEGATIVE Final    Staphylococcus aureus NEGATIVE NEGATIVE Final    Comment:        The Xpert SA Assay (FDA approved for NASAL specimens in patients over 11 years of age), is one component of a comprehensive surveillance program.  Test performance has been validated by [facility] for patients greater than or equal to 86 year old. It is not intended to diagnose infection nor to guide or monitor treatment.     RADIOLOGY:  No results found.  EKG:   Orders placed or performed during the hospital encounter of 10/21/15  . EKG 12-Lead  . EKG 12-Lead      Management plans discussed with the patient, family and they are in agreement.  CODE STATUS:  Code Status History    Date Active Date Inactive Code Status Order ID Comments User Context   10/21/2015  9:54 PM 10/25/2015  8:14 PM Full Code [id number]  Gracelyn Nurse, MD Inpatient   02/19/2015 10:26 AM 02/22/2015 12:22 PM Full Code [id number]  Purcell Nails, MD Inpatient   01/18/2015  1:54 PM 01/18/2015  9:58 PM Full Code [id number]  Tonny Bollman, MD Inpatient   12/28/2014  4:11 PM 12/31/2014  2:46 PM Full Code [id number]  Houston Siren, MD ED   12/21/2014  1:35 AM 12/22/2014  7:21 PM Full Code [id number]  Oralia Manis, MD Inpatient   12/11/2014  2:18 PM 12/14/2014  8:10 PM DNR [id number]  Crissie Figures, MD Inpatient   11/12/2014 12:51 PM 11/22/2014  9:33 PM DNR [id number]  Alford Highland, MD Inpatient   11/09/2014  2:31 PM 11/12/2014 12:51 PM Full Code [id number]  Ramonita Lab, MD Inpatient    Advance Directive Documentation        Most Recent Value   Type of Advance Directive  Healthcare Power of Attorney   Pre-existing out of facility DNR order (yellow form or pink MOST form)     "MOST" Form in Place?  TOTAL TIME TAKING CARE OF THIS PATIENT: 37 minutes.    Enid Baas M.D on 10/31/2015 at 7:21 AM  Between 7am to 6pm - Pager - [phone]  After 6pm go to [email or web address] - password [email or web address] [facility]  [facility]  Office   [phone]  CC: Primary care physician; [name], MD

## 2015-11-04 ENCOUNTER — Ambulatory Visit: Payer: Medicare Other | Admitting: Family Medicine

## 2015-11-05 ENCOUNTER — Ambulatory Visit: Payer: Medicare Other | Admitting: Family Medicine

## 2015-11-07 ENCOUNTER — Telehealth: Payer: Self-pay | Admitting: *Deleted

## 2015-11-07 ENCOUNTER — Ambulatory Visit: Payer: Medicare Other

## 2015-11-07 ENCOUNTER — Ambulatory Visit
Admission: RE | Admit: 2015-11-07 | Discharge: 2015-11-07 | Disposition: A | Discharge status: Home / Self Care | Payer: Medicare Other | Source: Ambulatory Visit | Attending: Pulmonary Disease | Admitting: Pulmonary Disease

## 2015-11-07 DIAGNOSIS — R0600 Dyspnea, unspecified: Secondary | ICD-10-CM | POA: Insufficient documentation

## 2015-11-07 DIAGNOSIS — R0902 Hypoxemia: Secondary | ICD-10-CM | POA: Insufficient documentation

## 2015-11-07 DIAGNOSIS — I700 Atherosclerosis of aorta: Secondary | ICD-10-CM | POA: Diagnosis not present

## 2015-11-07 NOTE — Telephone Encounter (Signed)
Pt came in scheduled for SMW and PFT. Pt stated she would NOT do the PFT due to her frequently coughing. Pt was asked about her medications for the SMW and stated she had taken her Albuterol rescue inhaler 30 minutes prior to coming in. Called DS and he states to reschedule SMW and have pt to keep f/u to discuss PFT and cough. Pt verbalized understanding and was r/s for SMW.

## 2015-11-12 ENCOUNTER — Ambulatory Visit (INDEPENDENT_AMBULATORY_CARE_PROVIDER_SITE_OTHER): Payer: Medicare Other | Admitting: *Deleted

## 2015-11-12 DIAGNOSIS — J449 Chronic obstructive pulmonary disease, unspecified: Secondary | ICD-10-CM | POA: Diagnosis not present

## 2015-11-12 NOTE — Progress Notes (Signed)
SMW today on 2L O2 per DS.

## 2015-11-13 ENCOUNTER — Telehealth: Payer: Self-pay | Admitting: Family Medicine

## 2015-11-13 NOTE — Telephone Encounter (Signed)
Patient cancelled her appointment on 11/15/15 for hospital follow up.  Patient said she'll call back to reschedule appointment.

## 2015-11-14 ENCOUNTER — Ambulatory Visit (INDEPENDENT_AMBULATORY_CARE_PROVIDER_SITE_OTHER): Payer: Medicare Other | Admitting: Pulmonary Disease

## 2015-11-14 ENCOUNTER — Encounter: Payer: Self-pay | Admitting: Pulmonary Disease

## 2015-11-14 VITALS — BP 116/82 | HR 81 | Ht 64.0 in | Wt 216.0 lb

## 2015-11-14 DIAGNOSIS — J9612 Chronic respiratory failure with hypercapnia: Secondary | ICD-10-CM | POA: Diagnosis not present

## 2015-11-14 DIAGNOSIS — I82512 Chronic embolism and thrombosis of left femoral vein: Secondary | ICD-10-CM | POA: Diagnosis not present

## 2015-11-14 DIAGNOSIS — E669 Obesity, unspecified: Secondary | ICD-10-CM

## 2015-11-14 DIAGNOSIS — R0902 Hypoxemia: Secondary | ICD-10-CM | POA: Diagnosis not present

## 2015-11-15 ENCOUNTER — Ambulatory Visit: Payer: Medicare Other | Admitting: Family Medicine

## 2015-11-17 NOTE — Progress Notes (Signed)
PULMONARY OFFICE FOLLOW UP NOTE  Requesting MD/Service: [name] Date of initial consultation: 09/26/15 Reason for consultation: COPD, dyspnea  PT PROFILE: 70 y.o. F former smoker previously followed by Dr Meredeth Ide with oxygen dependence, "COPD" and disabling dyspnea.  PFTs 01/19/15: normal spirometry, lung volumes and DLCO invalid PFTs 11/07/15: unable to perform 11/07/15: 150 meters. Desaturation to 77% despite O2 @ 2 LPM [place] CXR 11/07/15: NACPD  Hospitalization 07/03-07/07/17: for LLE DVT (recurrent). Discharged on Eliquis  ROV 11/14/15: No major changes in symptoms, class IV dyspnea. Severe hypoxemia and exercise related desaturation is unexplained. Previous spirometry normal. CXR normal. Echocardiogram with bubble study ordered  SUBJ: No new complaints. Recent hospitalization noted. Could not perform PFTs. 6 MWT results as above. Denies CP, fever, purulent sputum, hemoptysis, LE edema and calf tenderness  OBJ:  Vitals:   11/14/15 1029  BP: 116/82  Pulse: 81  SpO2: 98%  Weight: 216 lb (98 kg)  Height: 5\' 4"  (1.626 m)   3 lpm pulse  EXAM:  Gen: NAD HEENT: WNL Neck: No JVD noted  Lungs: breath sounds full, no wheezes Cardiovascular: RRR, + systolic M Abdomen: Obese, soft, nontender, normal BS Ext: without clubbing, cyanosis. 1+ symmetric edema Neuro: grossly intact Skin: Limited exam, no lesions noted  DATA:   BMP Latest Ref Rng & Units 10/25/2015 10/24/2015 10/21/2015  Glucose 65 - 99 mg/dL 97 509(T) 98  BUN 6 - 20 mg/dL 26(Z) 12(W) 58(K)  Creatinine 0.44 - 1.00 mg/dL 9.98(P) 3.82(N) 0.53(Z)  Sodium 135 - 145 mmol/L 136 138 139  Potassium 3.5 - 5.1 mmol/L 5.6(H) 5.0 4.2  Chloride 101 - 111 mmol/L 95(L) 97(L) 97(L)  CO2 22 - 32 mmol/L 30 31 30   Calcium 8.9 - 10.3 mg/dL 9.2 9.2 9.2    CBC Latest Ref Rng & Units 10/24/2015 10/21/2015 10/11/2015  WBC 3.6 - 11.0 K/uL 5.5 7.3 6.2  Hemoglobin 12.0 - 16.0 g/dL 11.0(L) 11.4(L) 11.6(L)  Hematocrit 35.0 - 47.0 %  33.1(L) 34.4(L) 35.4  Platelets 150 - 440 K/uL 148(L) 169 130(L)    CXR: as above   IMPRESSION:   1) Chronic hypoxic and hypercarbic respiratory failure 2) Recurrent DVT 3) Obesity 4) profound exercise limitation/class IV dyspnea with exercise desaturation that is not well explained by existing data. Concern for pulmonary vascular disease inc CTEPH though prior echocardiograms have not revealed pulm hypertension consistently. Possible R>L shunt. There is nothing to support her prior diagnosis of COPD (no obstruction on previous spirometry    PLAN:  1) Cont Anoro inhaler if she feels that it is beneficial (she was unsure) 2) Cont PRN albuterol 3) Discussed with Dr Mariah Milling - we will obtain an echocardiogram with bubble study 4) ROV 4 months but we will contact her with Echo results   Billy Fischer, MD PCCM service Mobile [phone] Pager [phone] 11/17/2015

## 2015-11-18 ENCOUNTER — Telehealth: Payer: Self-pay | Admitting: *Deleted

## 2015-11-18 NOTE — Telephone Encounter (Signed)
Her appointment is for 11/27/15 at 1:00. Pt to arrive at 12:45.          [address] [place], [place]   No prep. Pt may eat lunch.  LMOVM for pt to return call so that I can inform her of the Echo w/bubble study DS has ordered.

## 2015-11-18 NOTE — Telephone Encounter (Signed)
Pt states she can obnly do appts on Tuesdays or Thursdays due to Dialysis. Can we change the appt?

## 2015-11-18 NOTE — Telephone Encounter (Signed)
Appointment changed to Thurs 11/28/15 at 3:00 at [facility]. Pt to arrive at 2:45. Called and spoke with pt and with pt's husband. They are aware of appointment date, time and location along with arrival time. Nothing else needed at this time. Rhonda J Cobb

## 2015-11-21 ENCOUNTER — Telehealth: Payer: Self-pay | Admitting: Cardiovascular Disease

## 2015-11-21 NOTE — Telephone Encounter (Signed)
I spoke with the pt and made her aware of appointments in December with Dr Excell Seltzer.  The pt was questioning if she still needed to have Echo with bubble study next week.  I made her aware that this was ordered by Dr Sung Amabile for evaluation of hypoxemia and Hx of DVT.  I advised that she needs to keep this appointment.

## 2015-11-21 NOTE — Telephone Encounter (Signed)
New Message  Pt verbalized she was unaware of appt on 12.4.17 for Echocardiogram.  Pt is also unsure of Echocardiogram appt for 12.4 due to Echo Bubble on 8.10 @ 3 pm.  Pt stated she has dialysis on Mon, Wed, Fri.  Pt also stated she can leave at 10 am from dialysis to get here by 11 am.  Please follow up. wy

## 2015-11-22 ENCOUNTER — Other Ambulatory Visit: Payer: Self-pay | Admitting: Family Medicine

## 2015-11-24 NOTE — Telephone Encounter (Signed)
plz phone in. 

## 2015-11-25 ENCOUNTER — Other Ambulatory Visit: Payer: Self-pay | Admitting: Family Medicine

## 2015-11-25 NOTE — Telephone Encounter (Signed)
Pt aware.

## 2015-11-25 NOTE — Telephone Encounter (Signed)
[facility] [address] left v/m requesting quantity of alprazolam that was called in earlier. Spoke with Lupita Leash  at [employer] and quantity is # 30 x 0.Lupita Leash voiced understanding.

## 2015-11-25 NOTE — Telephone Encounter (Signed)
Mr [facility] signed) left v/m requesting status of refill.

## 2015-11-25 NOTE — Telephone Encounter (Signed)
rx called to [facility] on [address].

## 2015-11-27 ENCOUNTER — Other Ambulatory Visit (HOSPITAL_COMMUNITY): Payer: Medicare Other

## 2015-11-28 ENCOUNTER — Other Ambulatory Visit: Payer: Self-pay

## 2015-11-28 ENCOUNTER — Ambulatory Visit (HOSPITAL_COMMUNITY): Payer: Medicare Other | Attending: Pulmonary Disease

## 2015-11-28 ENCOUNTER — Other Ambulatory Visit: Payer: Self-pay | Admitting: *Deleted

## 2015-11-28 DIAGNOSIS — N186 End stage renal disease: Secondary | ICD-10-CM | POA: Insufficient documentation

## 2015-11-28 DIAGNOSIS — I998 Other disorder of circulatory system: Secondary | ICD-10-CM | POA: Insufficient documentation

## 2015-11-28 DIAGNOSIS — I82512 Chronic embolism and thrombosis of left femoral vein: Secondary | ICD-10-CM

## 2015-11-28 DIAGNOSIS — I340 Nonrheumatic mitral (valve) insufficiency: Secondary | ICD-10-CM | POA: Insufficient documentation

## 2015-11-28 DIAGNOSIS — R0902 Hypoxemia: Secondary | ICD-10-CM | POA: Diagnosis not present

## 2015-11-28 DIAGNOSIS — Z992 Dependence on renal dialysis: Secondary | ICD-10-CM | POA: Insufficient documentation

## 2015-11-28 DIAGNOSIS — I313 Pericardial effusion (noninflammatory): Secondary | ICD-10-CM | POA: Diagnosis not present

## 2015-11-28 DIAGNOSIS — J449 Chronic obstructive pulmonary disease, unspecified: Secondary | ICD-10-CM | POA: Insufficient documentation

## 2015-11-28 DIAGNOSIS — I509 Heart failure, unspecified: Secondary | ICD-10-CM | POA: Insufficient documentation

## 2015-11-28 DIAGNOSIS — I352 Nonrheumatic aortic (valve) stenosis with insufficiency: Secondary | ICD-10-CM | POA: Diagnosis not present

## 2015-11-28 DIAGNOSIS — I132 Hypertensive heart and chronic kidney disease with heart failure and with stage 5 chronic kidney disease, or end stage renal disease: Secondary | ICD-10-CM | POA: Insufficient documentation

## 2015-11-28 DIAGNOSIS — E785 Hyperlipidemia, unspecified: Secondary | ICD-10-CM | POA: Diagnosis not present

## 2015-11-28 DIAGNOSIS — Z953 Presence of xenogenic heart valve: Secondary | ICD-10-CM | POA: Insufficient documentation

## 2015-11-29 ENCOUNTER — Telehealth: Payer: Self-pay

## 2015-11-29 NOTE — Telephone Encounter (Signed)
Paperwork faxed as requested

## 2015-11-29 NOTE — Telephone Encounter (Signed)
Ariel Smith with [employer] left v/m requesting status of fax that was sent on 11/21/15; working on compression pump order for the swelling in pts legs. Ariel Smith request cb.

## 2015-12-03 ENCOUNTER — Telehealth: Payer: Self-pay | Admitting: Pulmonary Disease

## 2015-12-03 NOTE — Telephone Encounter (Signed)
Per DS, inform pt that the specifics he was looking for on the echo are not there. Inform pt that there are changes of heart failure and he wants pt to f/u with [name]. Staff message sent to [name] per DS.  Meyer Cory, LPN

## 2015-12-03 NOTE — Telephone Encounter (Signed)
Pt requesting results of Echo. Please advise.

## 2015-12-03 NOTE — Telephone Encounter (Signed)
Pt calling asking about the test she did last week  Can we call her about those results  Please advise

## 2015-12-04 NOTE — Telephone Encounter (Signed)
Pt informed and will call to get appt with Gollan. Nothing further needed.

## 2015-12-17 ENCOUNTER — Other Ambulatory Visit: Payer: Self-pay | Admitting: Family Medicine

## 2015-12-17 NOTE — Telephone Encounter (Signed)
Ok to refill? Last filled 11/24/15 #30 0RF

## 2015-12-18 NOTE — Telephone Encounter (Signed)
plz phoen in. 

## 2015-12-18 NOTE — Telephone Encounter (Signed)
Rx called in as directed.   

## 2015-12-26 ENCOUNTER — Other Ambulatory Visit: Payer: Self-pay | Admitting: Family Medicine

## 2015-12-26 DIAGNOSIS — Z1231 Encounter for screening mammogram for malignant neoplasm of breast: Secondary | ICD-10-CM

## 2015-12-28 ENCOUNTER — Other Ambulatory Visit: Payer: Self-pay | Admitting: Family Medicine

## 2016-01-07 ENCOUNTER — Telehealth: Payer: Self-pay | Admitting: Family Medicine

## 2016-01-07 NOTE — Telephone Encounter (Signed)
Pts daughter called to report that the pt had an anxiety attack recently and since then has been very depressed.  She is wondering if there should be a medication change and would like to discuss the pts new symptoms.  Can you please call her at [phone]

## 2016-01-08 NOTE — Telephone Encounter (Signed)
Darl Pikes pts daughter (DPR signed) called back; pt having depression but pt has had depression for a long time. Pt has days that she thinks no one wants to be around her and Darl Pikes will stay with mother and she seems better. Pt taking meds as directed.Darl Pikes wonders if needs to add a med for depression.Pt not sleeping well at night and Darl Pikes wants to know if pt could start taking Xanax daily instead of prn. Pt last seen 07/18/15 for annual and pt was to return in 3 months and pt did not schedule that appt. Pt has a 30 min appt already scheduled on 01/09/16 at 10 AM for f/u of [facility]. Darl Pikes is not going to be able to come to that appt and request Dr Reece Agar to call Darl Pikes prior to that appt.[facility] [address]

## 2016-01-08 NOTE — Telephone Encounter (Signed)
Spoke w daughter. Worsening depression. Sleeps better with xanax 1 tab nightly. Ok to take xanax. 1 tab every night.  Daughter asks we contact her after tomorrow's visit with any med changes.

## 2016-01-09 ENCOUNTER — Encounter: Payer: Self-pay | Admitting: Family Medicine

## 2016-01-09 ENCOUNTER — Ambulatory Visit (INDEPENDENT_AMBULATORY_CARE_PROVIDER_SITE_OTHER): Payer: Medicare Other | Admitting: Family Medicine

## 2016-01-09 ENCOUNTER — Other Ambulatory Visit: Payer: Self-pay

## 2016-01-09 VITALS — BP 126/80 | HR 71 | Temp 97.8°F | Wt 217.0 lb

## 2016-01-09 DIAGNOSIS — F331 Major depressive disorder, recurrent, moderate: Secondary | ICD-10-CM

## 2016-01-09 DIAGNOSIS — E785 Hyperlipidemia, unspecified: Secondary | ICD-10-CM

## 2016-01-09 DIAGNOSIS — N186 End stage renal disease: Secondary | ICD-10-CM

## 2016-01-09 DIAGNOSIS — L03116 Cellulitis of left lower limb: Secondary | ICD-10-CM

## 2016-01-09 DIAGNOSIS — I5032 Chronic diastolic (congestive) heart failure: Secondary | ICD-10-CM

## 2016-01-09 DIAGNOSIS — I82502 Chronic embolism and thrombosis of unspecified deep veins of left lower extremity: Secondary | ICD-10-CM

## 2016-01-09 DIAGNOSIS — Z952 Presence of prosthetic heart valve: Secondary | ICD-10-CM

## 2016-01-09 DIAGNOSIS — E538 Deficiency of other specified B group vitamins: Secondary | ICD-10-CM | POA: Diagnosis not present

## 2016-01-09 DIAGNOSIS — Z992 Dependence on renal dialysis: Secondary | ICD-10-CM

## 2016-01-09 DIAGNOSIS — IMO0001 Reserved for inherently not codable concepts without codable children: Secondary | ICD-10-CM

## 2016-01-09 DIAGNOSIS — I350 Nonrheumatic aortic (valve) stenosis: Secondary | ICD-10-CM

## 2016-01-09 MED ORDER — TRAZODONE HCL 50 MG PO TABS: 75.0000 mg | ORAL_TABLET | Freq: Every day | ORAL | 1 refills | Status: DC

## 2016-01-09 MED ORDER — ALPRAZOLAM 1 MG PO TABS: 1.0000 mg | ORAL_TABLET | Freq: Every day | ORAL | 0 refills | Status: DC

## 2016-01-09 MED ORDER — CYANOCOBALAMIN 1000 MCG/ML IJ SOLN
1000.0000 ug | Freq: Once | INTRAMUSCULAR | Status: AC
  Administered 2016-01-09: 1000 ug via INTRAMUSCULAR

## 2016-01-09 NOTE — Assessment & Plan Note (Signed)
May have been taking both lovastatin and atorvastatin since hospital discharge. Discussed stopping lovastatin, continuing atorvastatin. Lovastatin discarded today.

## 2016-01-09 NOTE — Progress Notes (Signed)
Pre visit review using our clinic review tool, if applicable. No additional management support is needed unless otherwise documented below in the visit note. 

## 2016-01-09 NOTE — Assessment & Plan Note (Signed)
B12 shot today. Lab Results  Component Value Date   VITAMINB12 >1500 (H) 07/10/2015

## 2016-01-09 NOTE — Telephone Encounter (Signed)
Pt's daughter Lynnell Dike advised, she verbalized understanding and read back instructions.

## 2016-01-09 NOTE — Assessment & Plan Note (Signed)
Resolved with keflex.

## 2016-01-09 NOTE — Assessment & Plan Note (Signed)
Pending f/u with cards next week. Reviewed recent echo with bubble study.

## 2016-01-09 NOTE — Patient Instructions (Addendum)
Make sure to stop aspirin 325mg . Instead you are now taking eliquis 2.5mg  twice daily.  Make sure to stop lovastatin 20mg . Instead take atorvastatin 40mg  daily.  May try alprazolam 1mg  nightly. I've printed out prescription to fill on 01/13/2016. Make sure to keep this medicine in a safe and secure place.  Continue celexa 20mg  daily. Try trazodone 1.5 tablets nightly for mood and sleep.  Return in 3 months for follow up visit. b12 shot today

## 2016-01-09 NOTE — Assessment & Plan Note (Signed)
Predominant portion of visit spent discussing mood. Largely situational depression around dialysis and functional status. Will continue celexa 20mg  daily, schedule xanax 1mg  nightly, and increase trazodone to 75mg  nightly. Also discussed non pharmacological strategies to improve mood and help relieve stress including outdoor trips and weekend trips. Pt and husband agree.  Update in 2 wks with effect of above changes.

## 2016-01-09 NOTE — Progress Notes (Addendum)
BP 126/80 (BP Location: Right Arm, Patient Position: Sitting, Cuff Size: Large)   Pulse 71   Temp 97.8 F (36.6 C) (Oral)   Wt 217 lb (98.4 kg)   SpO2 98% Comment: 3Liters O2  BMI 37.25 kg/m    CC: [facility] ER f/u visit Subjective:    Patient ID: Ariel Smith, female    DOB: [birth year], 70 y.o.   MRN: [id number]  HPI: AALEIGHA MERRELL is a 70 y.o. female presenting on 01/09/2016 for Hospitalization Follow-up (Blood Clots) and B12 Deficiency (Asking for shot today)   Recent hospitalization 10/2015 for LLE cellulitis with acute on chronic LLE recurrent DVTs. Hospital records reviewed. Treated with keflex. Was off anticoagulation due to h/o GI bleed, IVC filter in place. Eliquis was started. Patient also had L common femoral vein thrombectomy as well as angioplasty done.   Saw pulm (Simonds) in f/u for chronic hypoxic/hypercarbic respiratory failure. rec continued anoro and prn albuterol. Echo with bubble study showed EF 55-60%, with normal wall motion and grade 2 diastolic dysfunction.  Stable TAVR present. No R to L shunting Spirometry did not reveal significant obstruction. Pt has f/u with Dr Mariah Milling next week   See recent phone note. Daughter called to express concerns about worsening depression despite celexa 20mg  daily and trazodone 50mg  nightly. As she slept better with nightly xanax, this was added scheduled at night. Bad depression last week with bad anxiety attack on Sunday. Mainly tired of dialysis. Discussing daily home dialysis with nephrologist. They have also discussed kidney transplant, but pt and husband hesitant to undergo eval for this given and comorbidities.   She has been taking aspirin and eliquis.  She has been taking lovastatin and atorvastatin.   Has started lymphedema boots.   DATE OF ADMISSION:  10/21/2015    DATE OF DISCHARGE: 10/25/2015   No f/u phone call performed.  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 Interim medical history since our last visit reviewed. Allergies and medications reviewed and updated. Current Outpatient Prescriptions on File Prior to Visit  Medication Sig  . albuterol (VENTOLIN HFA) 108 (90 BASE) MCG/ACT inhaler Inhale 2 puffs into the lungs every 4 (four) hours as needed for wheezing or shortness of breath.  Marland Kitchen apixaban (ELIQUIS) 2.5 MG TABS tablet Take 1 tablet (2.5 mg total) by mouth 2 (two) times daily.  Marland Kitchen atorvastatin (LIPITOR) 40 MG tablet Take 1 tablet (40 mg total) by mouth daily at 6 PM.  . calcium acetate (PHOSLO) 667 MG capsule Take 667-2,001 mg by mouth 3 (three) times daily with meals. Take 3 with meals and 1 with a snack  . citalopram (CELEXA) 20 MG tablet Take 1 tablet (20 mg total) by mouth daily.  . clopidogrel (PLAVIX) 75 MG tablet Take 75 mg by mouth daily.  . cyanocobalamin (,VITAMIN B-12,) 1000 MCG/ML injection Inject 1,000 mcg into the muscle every 30 (thirty) days.  Marland Kitchen donepezil (ARICEPT) 5 MG tablet TAKE ONE TABLET BY MOUTH AT BEDTIME  . furosemide (LASIX) 20 MG tablet Take 1 tablet (20 mg total) by mouth daily.  Marland Kitchen lidocaine-prilocaine (EMLA) cream Apply 1 application topically as needed (topical anesthesia for hemodialysis if Gebauers and Lidocaine injection are ineffective.).  Marland Kitchen pantoprazole (PROTONIX) 40 MG tablet Take 1 tablet (40 mg total) by mouth 2 (two) times daily.  . promethazine (PHENERGAN)  12.5 MG tablet Take 12.5 mg by mouth every 6 (six) hours as needed for nausea or vomiting.  Marland Kitchen. acetaminophen (TYLENOL) 500 MG tablet Take 1,000 mg by mouth every 6 (six) hours as needed for mild pain or headache.   . calcitRIOL (ROCALTROL) 0.25 MCG capsule Take 0.25 mcg by mouth daily.  Marland Kitchen. docusate sodium (COLACE) 100 MG capsule Take 100 mg by mouth 2 (two) times daily as needed for mild constipation.  .  Multiple Vitamin (MULTIVITAMIN WITH MINERALS) TABS tablet Take 1 tablet by mouth daily.  Marland Kitchen. umeclidinium-vilanterol (ANORO ELLIPTA) 62.5-25 MCG/INH AEPB Inhale 1 puff into the lungs daily. (Patient not taking: Reported on 01/09/2016)   No current facility-administered medications on file prior to visit.     Review of Systems Per HPI unless specifically indicated in ROS section     Objective:    BP 126/80 (BP Location: Right Arm, Patient Position: Sitting, Cuff Size: Large)   Pulse 71   Temp 97.8 F (36.6 C) (Oral)   Wt 217 lb (98.4 kg)   SpO2 98% Comment: 3Liters O2  BMI 37.25 kg/m   Wt Readings from Last 3 Encounters:  01/09/16 217 lb (98.4 kg)  11/14/15 216 lb (98 kg)  10/25/15 230 lb 2.6 oz (104.4 kg)    Physical Exam  Constitutional: She appears well-developed and well-nourished. No distress.  HENT:  Mouth/Throat: Oropharynx is clear and moist. No oropharyngeal exudate.  Cardiovascular: Normal rate, regular rhythm and intact distal pulses.   Murmur (2/6 systolic) heard. Pulmonary/Chest: Effort normal and breath sounds normal. No respiratory distress. She has no wheezes. She has no rales.  Musculoskeletal: She exhibits no edema.  Nursing note and vitals reviewed.     Assessment & Plan:   Problem List Items Addressed This Visit    RESOLVED: Cellulitis    Resolved with keflex.      Chronic diastolic CHF (congestive heart failure) (HCC) - Primary    Pending f/u with cards next week. Reviewed recent echo with bubble study.       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 Advised stop aspirin, continue eliquis for now.       ESRD (end stage renal disease) on dialysis (HCC) (Chronic)   HLD (hyperlipidemia) (Chronic)    May have been taking both lovastatin and atorvastatin since hospital discharge. Discussed stopping lovastatin, continuing atorvastatin. Lovastatin discarded today.       MDD  (major depressive disorder), recurrent episode, moderate (HCC)    Predominant portion of visit spent discussing mood. Largely situational depression around dialysis and functional status. Will continue celexa 20mg  daily, schedule xanax 1mg  nightly, and increase trazodone to 75mg  nightly. Also discussed non pharmacological strategies to improve mood and help relieve stress including outdoor trips and weekend trips. Pt and husband agree.  Update in 2 wks with effect of above changes.       Relevant Medications   ALPRAZolam (XANAX) 1 MG tablet   traZODone (DESYREL) 50 MG tablet   Vitamin B12 deficiency    B12 shot today. Lab Results  Component Value Date   VITAMINB12 >1500 (H) 07/10/2015         Other Visit Diagnoses   None.      Follow up plan: Return in about 3 months (around 04/09/2016) for follow up visit.  Eustaquio BoydenJavier Danthony Kendrix, MD

## 2016-01-09 NOTE — Addendum Note (Signed)
Addended by: Eual Fines on: 01/09/2016 11:23 AM   Modules accepted: Orders

## 2016-01-09 NOTE — Telephone Encounter (Signed)
lmom for pt's daughter Lynnell Dike to return my call.

## 2016-01-09 NOTE — Assessment & Plan Note (Signed)
Has been taking eliquis and aspirin 325mg . Advised stop aspirin, continue eliquis for now.

## 2016-01-09 NOTE — Telephone Encounter (Addendum)
plz call daughter and discuss below: Make sure to stop aspirin 325mg . Instead you are now taking eliquis 2.5mg  twice daily.  Make sure to stop lovastatin 20mg . Instead take atorvastatin 40mg  daily.  May try alprazolam 1mg  nightly. I've printed out prescription to fill on 01/13/2016. Make sure to keep this medicine in a safe and secure place.  Continue celexa 20mg  daily. Try trazodone 1.5 tablets nightly for mood and sleep.

## 2016-01-16 ENCOUNTER — Ambulatory Visit
Admission: RE | Admit: 2016-01-16 | Discharge: 2016-01-16 | Disposition: A | Discharge status: Home / Self Care | Payer: Medicare Other | Source: Ambulatory Visit | Attending: Family Medicine | Admitting: Family Medicine

## 2016-01-16 DIAGNOSIS — R928 Other abnormal and inconclusive findings on diagnostic imaging of breast: Secondary | ICD-10-CM | POA: Diagnosis not present

## 2016-01-16 DIAGNOSIS — Z1231 Encounter for screening mammogram for malignant neoplasm of breast: Secondary | ICD-10-CM | POA: Insufficient documentation

## 2016-01-17 ENCOUNTER — Ambulatory Visit (INDEPENDENT_AMBULATORY_CARE_PROVIDER_SITE_OTHER): Payer: Medicare Other | Admitting: Cardiovascular Disease

## 2016-01-17 ENCOUNTER — Other Ambulatory Visit: Payer: Self-pay | Admitting: Family Medicine

## 2016-01-17 ENCOUNTER — Encounter: Payer: Self-pay | Admitting: Cardiovascular Disease

## 2016-01-17 VITALS — BP 116/60 | HR 75 | Ht 64.0 in | Wt 219.5 lb

## 2016-01-17 DIAGNOSIS — I5032 Chronic diastolic (congestive) heart failure: Secondary | ICD-10-CM

## 2016-01-17 DIAGNOSIS — E785 Hyperlipidemia, unspecified: Secondary | ICD-10-CM | POA: Diagnosis not present

## 2016-01-17 DIAGNOSIS — I82509 Chronic embolism and thrombosis of unspecified deep veins of unspecified lower extremity: Secondary | ICD-10-CM | POA: Diagnosis not present

## 2016-01-17 DIAGNOSIS — D638 Anemia in other chronic diseases classified elsewhere: Secondary | ICD-10-CM

## 2016-01-17 DIAGNOSIS — N186 End stage renal disease: Secondary | ICD-10-CM

## 2016-01-17 DIAGNOSIS — I10 Essential (primary) hypertension: Secondary | ICD-10-CM | POA: Diagnosis not present

## 2016-01-17 DIAGNOSIS — N6489 Other specified disorders of breast: Secondary | ICD-10-CM

## 2016-01-17 DIAGNOSIS — Z952 Presence of prosthetic heart valve: Secondary | ICD-10-CM

## 2016-01-17 DIAGNOSIS — Z954 Presence of other heart-valve replacement: Secondary | ICD-10-CM

## 2016-01-17 NOTE — Patient Instructions (Signed)

## 2016-01-17 NOTE — Progress Notes (Signed)
Cardiology Office Note  Date:  01/17/2016   ID:  Ariel Smith, Ariel Smith 1945-05-06, MRN [id number]  PCP:  [name], MD   Chief Complaint  Patient presents with  . Other    6 month f/u . Meds reviewed verbally with pt.    HPI:  70 y.o. female with h/o end-stage renal disease, on hemodialysis, followed by Dr. Cherylann Ratel severe aortic valve stenosis, GI bleed the beginning of August 2016, her warfarin held, hypertension, previous DVT/PE, COPD, chronic shortness of breath, presenting after TAVR in early November 2016. quit smoking in 2009.  She presents for follow-up of her prosthetic aortic valve Currently on aspirin and Plavix for 6 months  In follow-up today, she reports developing a DVT 09/2015 Currently on eliquis and plavix Previously seen by Dr. dew, had thrombectomy  IVC filter in place  Echo 11/2015 showing normal LV systolic function, prosthetic valve intact Has follow-up with Dr. Excell Seltzer in December  Takes lasix daily, HD mon/wed/friday, Dry weight 98KG Sometimes low pressures at the end of dialysis Takes midodrine. Provided by hemodialysis center   Review of lab work  Anemia better: HBG 8 up to 11  chronic shortness of breath, mild, no significant change  PFTs October 2016 showing severe perfusion abnormality She wears chronic oxygen, 2 L  No regular exercise program.Previously  not interested in cardiac or pulmonary rehabilitation  EKG on today's visit shows normal sinus rhythm with rate 75 bpm, prolonged QT interval, no significant ST or T-wave changes  Other past medical history Previous hospital admissions for hypoxia, placed on BiPAP, improved with dialysis  She is on warfarin for history of DVT and PE , status post IVC filter   PMH:   has a past medical history of Acute DVT (deep venous thrombosis) (HCC) (2017); Anemia of chronic disease; Bronchiectasis (HCC) (08/2014 ); Chronic diastolic CHF (congestive heart failure) (HCC); Chronic respiratory failure  (HCC); COPD (chronic obstructive pulmonary disease) (HCC); Depression; ESRD (end stage renal disease) (HCC) (08/2011); Frequent headaches; GERD (gastroesophageal reflux disease); GIB (gastrointestinal bleeding); Heart murmur; History of colon polyps (2013); History of DVT of lower extremity (2009, 2017); HLD (hyperlipidemia); HTN (hypertension); Morbid obesity (HCC); Osteoarthritis; Osteopenia (01/2013); Pulmonary embolism (HCC) (2009); Renal failure; 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  PSH:    Past Surgical History:  Procedure Laterality Date  . BUNIONECTOMY Left 2003  . CARDIAC CATHETERIZATION N/A 01/18/2015   patent coronary arteries without significant osbtruction and preserved LV function, severe aortic stenosis; Procedure: Right/Left Heart Cath and Coronary Angiography;  Surgeon: Tonny Bollman, MD  . CHOLECYSTECTOMY  2012  . COLONOSCOPY  08/2011   colon biopsies, [name]  . ESOPHAGOGASTRODUODENOSCOPY  08/2011   gastric cardia polyp  . ESOPHAGOGASTRODUODENOSCOPY Left 11/11/2014   Procedure: ESOPHAGOGASTRODUODENOSCOPY (EGD);  Surgeon: [name], MD;  Location: [facility] ENDOSCOPY;  Service: Endoscopy;  Laterality: Left;  . EXTERIORIZATION OF A CONTINUOUS AMBULATORY PERITONEAL DIALYSIS CATHETER  01/2013   removal 12/2103 - [name]  . hospitalization  12/2013   recurrent R pleural effusion due to peritoneal fluid translocation s/p rpt thoracentesis with 1.3 L fluid removed, ERSD started on HD this hospitalization  . PERIPHERAL VASCULAR CATHETERIZATION N/A 11/12/2014   Procedure: IVC Filter Insertion;  Surgeon: [name], MD;  Location: [facility] INVASIVE CV LAB;  Service: Cardiovascular;  Laterality: N/A;  . PERIPHERAL VASCULAR CATHETERIZATION Left 10/24/2015   Procedure: Lower Extremity Venography with intervention (thormbolysis/thrombectomy);  Surgeon: Marlow Baars  Wyn Quaker, MD;  Location: [facility] INVASIVE  CV LAB;  Service: Cardiovascular;  Laterality: Left;  . REPLACEMENT TOTAL KNEE Left 2006  . SHOULDER ARTHROSCOPY Right 2009  . TEE WITHOUT CARDIOVERSION N/A 02/19/2015   Procedure: TRANSESOPHAGEAL ECHOCARDIOGRAM (TEE);  Surgeon: Tonny Bollman, MD;  Location: [facility] OR;  Service: Open Heart Surgery;  Laterality: N/A;  . TONSILLECTOMY  1955  . TRANSCATHETER AORTIC VALVE REPLACEMENT, TRANSFEMORAL Right 02/19/2015   Procedure: TRANSCATHETER AORTIC VALVE REPLACEMENT, TRANSFEMORAL;  Surgeon: Tonny Bollman, MD;  Location: [facility] OR;  Service: Open Heart Surgery;  Laterality: Right;  . TUBAL LIGATION  1980  . US ECHOCARDIOGRAPHY  12/2013   EF 55-60%, nl LV sys fxn, mild-mod MR, AS, increased LV posterior wall thickness, mild TR    Current Outpatient Prescriptions  Medication Sig Dispense Refill  . acetaminophen (TYLENOL) 500 MG tablet Take 1,000 mg by mouth every 6 (six) hours as needed for mild pain or headache.     . albuterol (VENTOLIN HFA) 108 (90 BASE) MCG/ACT inhaler Inhale 2 puffs into the lungs every 4 (four) hours as needed for wheezing or shortness of breath. 18 g 3  . ALPRAZolam (XANAX) 1 MG tablet Take 1 tablet (1 mg total) by mouth at bedtime. 30 tablet 0  . apixaban (ELIQUIS) 2.5 MG TABS tablet Take 1 tablet (2.5 mg total) by mouth 2 (two) times daily. 60 tablet 2  . atorvastatin (LIPITOR) 40 MG tablet Take 1 tablet (40 mg total) by mouth daily at 6 PM. 30 tablet 2  . baclofen (LIORESAL) 10 MG tablet Take 10 mg by mouth 2 (two) times daily.    . calcitRIOL (ROCALTROL) 0.25 MCG capsule Take 0.25 mcg by mouth daily.    . calcium acetate (PHOSLO) 667 MG capsule Take 667-2,001 mg by mouth 3 (three) times daily with meals. Take 3 with meals and 1 with a snack    . citalopram (CELEXA) 20 MG tablet Take 1 tablet (20 mg total) by mouth daily. 30 tablet 3  . clopidogrel (PLAVIX) 75 MG tablet Take 75 mg by mouth daily.    . cyanocobalamin (,VITAMIN B-12,) 1000 MCG/ML injection Inject 1,000 mcg into the  muscle every 30 (thirty) days.    Marland Kitchen docusate sodium (COLACE) 100 MG capsule Take 100 mg by mouth 2 (two) times daily as needed for mild constipation.    Marland Kitchen donepezil (ARICEPT) 5 MG tablet TAKE ONE TABLET BY MOUTH AT BEDTIME 30 tablet 6  . furosemide (LASIX) 20 MG tablet Take 1 tablet (20 mg total) by mouth daily. 30 tablet 6  . lidocaine-prilocaine (EMLA) cream Apply 1 application topically as needed (topical anesthesia for hemodialysis if Gebauers and Lidocaine injection are ineffective.). 30 g 0  . Multiple Vitamin (MULTIVITAMIN WITH MINERALS) TABS tablet Take 1 tablet by mouth daily.    . pantoprazole (PROTONIX) 40 MG tablet Take 1 tablet (40 mg total) by mouth 2 (two) times daily. 60 tablet 2  . promethazine (PHENERGAN) 12.5 MG tablet Take 12.5 mg by mouth every 6 (six) hours as needed for nausea or vomiting.    . traZODone (DESYREL) 50 MG tablet Take 1.5 tablets (75 mg total) by mouth at bedtime. 135 tablet 1  . umeclidinium-vilanterol (ANORO ELLIPTA) 62.5-25 MCG/INH AEPB Inhale 1 puff into the lungs daily. 60 each 5   No current facility-administered medications for this visit.      Allergies:   Nodolor [isometheptene-dichloral-apap]   Social History:  The patient  reports that she quit smoking about 8 years ago.  Her smoking use included Cigarettes. She started smoking about 37 years ago. She has a 60.00 pack-year smoking history. She has never used smokeless tobacco. She reports that she does not drink alcohol or use drugs.   Family History:   family history includes Breast cancer (age of onset: 4442) in her daughter; Cancer in her father; Cancer (age of onset: 1680) in her mother; Deep vein thrombosis in her brother; Dementia (age of onset: 10077) in her father; Hypertension in her brother and father; Stroke in her mother.    Review of Systems: Review of Systems  Constitutional: Negative.   Respiratory: Positive for shortness of breath.   Cardiovascular: Negative.   Gastrointestinal:  Negative.   Musculoskeletal: Negative.   Neurological: Negative.   Psychiatric/Behavioral: Negative.   All other systems reviewed and are negative.    PHYSICAL EXAM: VS:  BP 116/60 (BP Location: Right Arm, Patient Position: Sitting, Cuff Size: Large)   Pulse 75   Ht 5\' 4"  (1.626 m)   Wt 219 lb 8 oz (99.6 kg)   BMI 37.68 kg/m  , BMI Body mass index is 37.68 kg/m. GEN: Well nourished, well developed, in no acute distress, obese  HEENT: normal  Neck: no JVD, carotid bruits, or masses Cardiac: RRR; no murmurs, rubs, or gallops,no edema  Respiratory:  clear to auscultation bilaterally, normal work of breathing GI: soft, nontender, nondistended, + BS MS: no deformity or atrophy  Skin: warm and dry, no rash Neuro:  Strength and sensation are intact Psych: euthymic mood, full affect    Recent Labs: 02/20/2015: Magnesium 1.6 10/11/2015: ALT 15 10/24/2015: Hemoglobin 11.0; Platelets 148 10/25/2015: BUN 58; Creatinine, Ser 8.34; Potassium 5.6; Sodium 136    Lipid Panel Lab Results  Component Value Date   CHOL 149 07/10/2015   HDL 34.30 (L) 07/10/2015   LDLCALC 91 07/10/2015   TRIG 116.0 07/10/2015      Wt Readings from Last 3 Encounters:  01/17/16 219 lb 8 oz (99.6 kg)  01/09/16 217 lb (98.4 kg)  11/14/15 216 lb (98 kg)       ASSESSMENT AND PLAN:  Chronic diastolic CHF (congestive heart failure) (HCC) - Plan: EKG 12-Lead Relatively euvolemic on Lasix 20 mg daily and hemodialysis Comfortable with her current dry weight, has hemodialysis 3 days per week  Essential hypertension Blood pressure is well controlled on today's visit. No changes made to the medications.  HLD (hyperlipidemia) Cholesterol is at goal on the current lipid regimen. No changes to the medications were made.  Chronic deep vein thrombosis (DVT) of lower extremity, unspecified laterality, unspecified vein (HCC)  S/P TAVR (transcatheter aortic valve replacement) Recent echocardiogram, has follow-up  with Dr. Excell Seltzerooper December 2017 Valve appears to be functioning well  Anemia of chronic disease Dramatically improved hemoglobin Numbers reviewed with her  End-stage renal disease On hemodialysis 3 days per week   Total encounter time more than 25 minutes  Greater than 50% was spent in counseling and coordination of care with the patient   Disposition:   F/U  6 months   Orders Placed This Encounter  Procedures  . EKG 12-Lead     Signed, Dossie Arbourim Keria Widrig, M.D., Ph.D. 01/17/2016  [facility], [place] [phone]

## 2016-01-18 ENCOUNTER — Other Ambulatory Visit: Payer: Self-pay | Admitting: Family Medicine

## 2016-01-20 ENCOUNTER — Other Ambulatory Visit: Payer: Self-pay | Admitting: Family Medicine

## 2016-01-20 NOTE — Telephone Encounter (Signed)
plz phone in. 

## 2016-01-20 NOTE — Telephone Encounter (Signed)
Rx called in as directed.   

## 2016-02-02 ENCOUNTER — Other Ambulatory Visit: Payer: Self-pay | Admitting: Family Medicine

## 2016-02-11 ENCOUNTER — Other Ambulatory Visit: Payer: Self-pay

## 2016-02-11 MED ORDER — APIXABAN 2.5 MG PO TABS: 2.5000 mg | ORAL_TABLET | Freq: Two times a day (BID) | ORAL | 6 refills | Status: DC

## 2016-02-11 NOTE — Telephone Encounter (Signed)
Ariel Smith (DPR signed) said pt has been out of eliquis for 2 weeks waiting on refill. Dr Enid Baas hospitalist started pt on eliquis 10/25/15 # 60 x 2. Pt saw Dr Reece Agar 01/09/16. Do you want pt to continue on eliquis now?[facility] [address] [place].Please advise.

## 2016-02-11 NOTE — Telephone Encounter (Signed)
plz notify eliquis refilled.

## 2016-02-11 NOTE — Telephone Encounter (Signed)
[name] notified

## 2016-02-17 ENCOUNTER — Other Ambulatory Visit: Payer: Self-pay | Admitting: *Deleted

## 2016-02-17 MED ORDER — ATORVASTATIN CALCIUM 40 MG PO TABS: 40.0000 mg | ORAL_TABLET | Freq: Every day | ORAL | 1 refills | Status: DC

## 2016-02-18 ENCOUNTER — Ambulatory Visit (INDEPENDENT_AMBULATORY_CARE_PROVIDER_SITE_OTHER): Payer: Medicare Other | Admitting: Vascular Surgery

## 2016-02-20 ENCOUNTER — Ambulatory Visit
Admission: RE | Admit: 2016-02-20 | Discharge: 2016-02-20 | Disposition: A | Discharge status: Home / Self Care | Payer: Medicare Other | Source: Ambulatory Visit | Attending: Family Medicine | Admitting: Family Medicine

## 2016-02-20 ENCOUNTER — Other Ambulatory Visit: Payer: Self-pay | Admitting: Family Medicine

## 2016-02-20 DIAGNOSIS — N6489 Other specified disorders of breast: Secondary | ICD-10-CM

## 2016-02-20 DIAGNOSIS — N6320 Unspecified lump in the left breast, unspecified quadrant: Secondary | ICD-10-CM

## 2016-03-01 ENCOUNTER — Other Ambulatory Visit: Payer: Self-pay | Admitting: Family Medicine

## 2016-03-02 NOTE — Telephone Encounter (Signed)
plz phone in. 

## 2016-03-02 NOTE — Telephone Encounter (Signed)
Ok to refill? Last filled 01/20/16 #30 0RF

## 2016-03-02 NOTE — Telephone Encounter (Signed)
Called into [facility] [address]), [place] [address]: [phone]

## 2016-03-10 ENCOUNTER — Ambulatory Visit: Payer: Medicare Other | Admitting: Pulmonary Disease

## 2016-03-10 ENCOUNTER — Ambulatory Visit: Payer: Medicare Other

## 2016-03-11 ENCOUNTER — Encounter: Payer: Self-pay | Admitting: *Deleted

## 2016-03-18 ENCOUNTER — Telehealth: Payer: Self-pay | Admitting: Family Medicine

## 2016-03-18 NOTE — Telephone Encounter (Signed)
fmla paperwork for patient daughter (Ariel Smith) In dr g in box

## 2016-03-19 NOTE — Telephone Encounter (Signed)
Filled and in Kim's box. 

## 2016-03-19 NOTE — Telephone Encounter (Signed)
Forms faxed

## 2016-03-19 NOTE — Telephone Encounter (Signed)
Forms given to Robin 

## 2016-03-23 ENCOUNTER — Ambulatory Visit: Payer: Medicare Other | Admitting: Cardiovascular Disease

## 2016-03-23 ENCOUNTER — Telehealth (HOSPITAL_COMMUNITY): Payer: Self-pay | Admitting: Cardiovascular Disease

## 2016-03-23 ENCOUNTER — Other Ambulatory Visit (HOSPITAL_COMMUNITY): Payer: Medicare Other

## 2016-03-23 NOTE — Telephone Encounter (Signed)
Ariel Smith is aware paperwork has been faxed Copy for pt  Copy for scan   Copy for file

## 2016-03-26 NOTE — Telephone Encounter (Signed)
Close encounter 

## 2016-04-03 ENCOUNTER — Encounter: Payer: Self-pay | Admitting: Cardiovascular Disease

## 2016-04-09 ENCOUNTER — Encounter: Payer: Self-pay | Admitting: Family Medicine

## 2016-04-09 ENCOUNTER — Ambulatory Visit (INDEPENDENT_AMBULATORY_CARE_PROVIDER_SITE_OTHER): Payer: Medicare Other | Admitting: Family Medicine

## 2016-04-09 VITALS — BP 134/82 | HR 73 | Temp 97.5°F | Wt 220.8 lb

## 2016-04-09 DIAGNOSIS — N186 End stage renal disease: Secondary | ICD-10-CM | POA: Diagnosis not present

## 2016-04-09 DIAGNOSIS — R222 Localized swelling, mass and lump, trunk: Secondary | ICD-10-CM

## 2016-04-09 DIAGNOSIS — I10 Essential (primary) hypertension: Secondary | ICD-10-CM | POA: Diagnosis not present

## 2016-04-09 DIAGNOSIS — J449 Chronic obstructive pulmonary disease, unspecified: Secondary | ICD-10-CM

## 2016-04-09 DIAGNOSIS — F331 Major depressive disorder, recurrent, moderate: Secondary | ICD-10-CM

## 2016-04-09 MED ORDER — DOXYCYCLINE HYCLATE 100 MG PO TABS: 100.0000 mg | ORAL_TABLET | Freq: Two times a day (BID) | ORAL | 0 refills | Status: DC

## 2016-04-09 MED ORDER — PROMETHAZINE HCL 12.5 MG PO TABS: 12.5000 mg | ORAL_TABLET | Freq: Four times a day (QID) | ORAL | 0 refills | Status: DC | PRN

## 2016-04-09 MED ORDER — ALPRAZOLAM 1 MG PO TABS: 1.0000 mg | ORAL_TABLET | Freq: Every day | ORAL | 3 refills | Status: DC

## 2016-04-09 MED ORDER — DONEPEZIL HCL 5 MG PO TABS: 5.0000 mg | ORAL_TABLET | Freq: Every day | ORAL | 3 refills | Status: DC

## 2016-04-09 NOTE — Progress Notes (Signed)
Pre visit review using our clinic review tool, if applicable. No additional management support is needed unless otherwise documented below in the visit note. 

## 2016-04-09 NOTE — Assessment & Plan Note (Signed)
Continues HD. Appreciate renal care of patient.

## 2016-04-09 NOTE — Assessment & Plan Note (Signed)
Anticipate R chest wall cyst vs LAD - treat with warm compresses and 7d doxy course. If no improvement, low threshold to refer to surgery for excision. Pt agrees with plan.  Noted upcoming L breast biopsy for abnormal mammogram.

## 2016-04-09 NOTE — Assessment & Plan Note (Addendum)
Chronic, improved on current regimen.  

## 2016-04-09 NOTE — Assessment & Plan Note (Signed)
Overuse of albuterol noted. Reviewed controller medication use. Pt states she is not taking anoro but rather has returned to symbicort, but not using regularly. rec 1 puff BID and increase to 2 puffs BID scheduled if needed

## 2016-04-09 NOTE — Patient Instructions (Addendum)
Continue current medicines Be more regular with symbicort. For lump on right chest - treat with warm compresses and antibiotic doxycycline. If this doesn't improve lump, let me know for surgery evaluation.  Return in 4 months for physical.

## 2016-04-09 NOTE — Progress Notes (Signed)
BP 134/82 (BP Location: Right Arm, Patient Position: Sitting, Cuff Size: Large)   Pulse 73   Temp 97.5 F (36.4 C) (Oral)   Wt 220 lb 12 oz (100.1 kg)   SpO2 94%   BMI 37.89 kg/m    CC: 3 mo f/u visit Subjective:    Patient ID: Ariel Smith, female    DOB: 08/04/1945, 70 y.o.   MRN: [id number]  HPI: Ariel PontesShirley P Graziani is a 70 y.o. female presenting on 04/09/2016 for Follow-up (3 month f/u)   Casimiro NeedleMichael passed away last month.   See prior note for details. Last visit for worsening MDD - we continued celexa 20mg , xanax 1mg  nightly, and increased trazodone to 75mg  nightly.   COPD - she is not taking anoro "ineffective" and has returned to symbicort. Ongoing chronic cough. Taking symbicort with PRN albuterol. Using albuterol multiple times a day. Denies chest pain, fevers/chills.   Lump on R chest wall noticed over last few weeks. Not too tender, no fever.   Relevant past medical, surgical, family and social history reviewed and updated as indicated. Interim medical history since our last visit reviewed. Allergies and medications reviewed and updated. Current Outpatient Prescriptions on File Prior to Visit  Medication Sig  . acetaminophen (TYLENOL) 500 MG tablet Take 1,000 mg by mouth every 6 (six) hours as needed for mild pain or headache.   . albuterol (VENTOLIN HFA) 108 (90 BASE) MCG/ACT inhaler Inhale 2 puffs into the lungs every 4 (four) hours as needed for wheezing or shortness of breath.  Marland Kitchen. apixaban (ELIQUIS) 2.5 MG TABS tablet Take 1 tablet (2.5 mg total) by mouth 2 (two) times daily.  Marland Kitchen. atorvastatin (LIPITOR) 40 MG tablet Take 1 tablet (40 mg total) by mouth daily at 6 PM.  . baclofen (LIORESAL) 10 MG tablet Take 10 mg by mouth 2 (two) times daily.  . calcitRIOL (ROCALTROL) 0.25 MCG capsule Take 0.25 mcg by mouth daily.  . calcium acetate (PHOSLO) 667 MG capsule Take 667-2,001 mg by mouth 3 (three) times daily with meals. Take 3 with meals and 1 with a snack  . citalopram  (CELEXA) 20 MG tablet TAKE ONE TABLET BY MOUTH ONCE DAILY  . clopidogrel (PLAVIX) 75 MG tablet Take 75 mg by mouth daily.  . cyanocobalamin (,VITAMIN B-12,) 1000 MCG/ML injection Inject 1,000 mcg into the muscle every 30 (thirty) days.  Marland Kitchen. docusate sodium (COLACE) 100 MG capsule Take 100 mg by mouth 2 (two) times daily as needed for mild constipation.  . furosemide (LASIX) 20 MG tablet Take 1 tablet (20 mg total) by mouth daily.  Marland Kitchen. lidocaine-prilocaine (EMLA) cream Apply 1 application topically as needed (topical anesthesia for hemodialysis if Gebauers and Lidocaine injection are ineffective.).  Marland Kitchen. Multiple Vitamin (MULTIVITAMIN WITH MINERALS) TABS tablet Take 1 tablet by mouth daily.  . pantoprazole (PROTONIX) 40 MG tablet Take 1 tablet (40 mg total) by mouth 2 (two) times daily.  . traZODone (DESYREL) 50 MG tablet Take 1.5 tablets (75 mg total) by mouth at bedtime.   No current facility-administered medications on file prior to visit.     Review of Systems Per HPI unless specifically indicated in ROS section     Objective:    BP 134/82 (BP Location: Right Arm, Patient Position: Sitting, Cuff Size: Large)   Pulse 73   Temp 97.5 F (36.4 C) (Oral)   Wt 220 lb 12 oz (100.1 kg)   SpO2 94%   BMI 37.89 kg/m   Wt Readings from Last  3 Encounters:  04/09/16 220 lb 12 oz (100.1 kg)  01/17/16 219 lb 8 oz (99.6 kg)  01/09/16 217 lb (98.4 kg)    Physical Exam  Constitutional: She appears well-developed and well-nourished. No distress.  HENT:  Mouth/Throat: Oropharynx is clear and moist. No oropharyngeal exudate.  Cardiovascular: Normal rate, regular rhythm, normal heart sounds and intact distal pulses.   No murmur heard. Pulmonary/Chest: Effort normal and breath sounds normal. No respiratory distress. She has no wheezes. She has no rales.  Musculoskeletal: She exhibits no edema.  Skin: Skin is warm and dry. No erythema.  Firm indurated lump R chest wall with drainage of small bit of pus  like fluid with compression, no fluctuance  Psychiatric: She has a normal mood and affect.  Nursing note and vitals reviewed.     Assessment & Plan:   Problem List Items Addressed This Visit    COPD (chronic obstructive pulmonary disease) (HCC) (Chronic)    Overuse of albuterol noted. Reviewed controller medication use. Pt states she is not taking anoro but rather has returned to symbicort, but not using regularly. rec 1 puff BID and increase to 2 puffs BID scheduled if needed       Relevant Medications   promethazine (PHENERGAN) 12.5 MG tablet   budesonide-formoterol (SYMBICORT) 160-4.5 MCG/ACT inhaler   End stage renal disease (HCC) (Chronic)    Continues HD. Appreciate renal care of patient.       HTN (hypertension) (Chronic)    Chronic, stable. Continue current regimen.       Mass of chest wall, right    Anticipate R chest wall cyst vs LAD - treat with warm compresses and 7d doxy course. If no improvement, low threshold to refer to surgery for excision. Pt agrees with plan.  Noted upcoming L breast biopsy for abnormal mammogram.       MDD (major depressive disorder), recurrent episode, moderate (HCC) - Primary    Chronic, improved on current regimen.       Relevant Medications   ALPRAZolam (XANAX) 1 MG tablet       Follow up plan: Return in about 4 months (around 08/08/2016) for annual exam, prior fasting for blood work.  [name], MD

## 2016-04-09 NOTE — Assessment & Plan Note (Signed)
Chronic, stable. Continue current regimen. 

## 2016-04-15 ENCOUNTER — Telehealth: Payer: Self-pay

## 2016-04-15 NOTE — Telephone Encounter (Signed)
Pt due for 1 YEAR echocardiogram and OV for TAVR follow-up (s/p TAVR 02/19/15).  The pt was contacted 11/21/15 and scheduled for echocardiogram and office visit on 03/23/16.  The pt already had an Echo bubble scheduled in the system on 11/28/15 ordered by Dr Sung Amabile.  The pt was advised to proceed with this test as scheduled and I also made her aware that due to the time frame for TAVR follow-up that she would need to keep her appointments for echo and office visit on 03/23/16. The pt no showed for her 03/23/16 appointments and our office did speak with her about rescheduling.  Per notes from [place] the pt said Dr Excell Seltzer should contact Dr Mariah Milling because she recently had an echocardiogram.  I left 2 additional messages on the pt's voicemail to make her aware that we need to reschedule her appointments to meet TAVR guidelines.  The pt did not return call to our office.        Iona Coach, RN  Iona Coach, RN        Left message for pt to call the office to reschedule echocardiogram and office visit.   Previous Messages    ----- Message -----  From: Elita Boone  Sent: 03/24/2016  3:07 PM  To: Iona Coach, RN   Hey I called Mrs Ariel Smith about trying to get her rescheduled for her echo that she missed on 12/4. Patient voiced that Dr. Excell Seltzer should contact [name] because she just recently had one.  ----- Message -----  From: Gerome Sam  Sent: 03/23/2016  1:27 PM  To: Iona Coach, RN, Charna Elizabeth, LPN, *   36/1/22 Lauren, We need your help.  Thanks  ----- Message -----  From: Charna Elizabeth, LPN  Sent: 44/12/7528 12:31 PM  To: [facility]   Pt was a no show for Dr.Cooper and no show echo.Can someone call and reschedule echo then about 1 hour later schedule appt with Dr.Cooper Send new appts to [employer]

## 2016-04-28 IMAGING — US US EXTREM LOW BILAT COMP
1 series · 12 of 25 positions shown · non-contrast
Comparison: September 15, 2011

ADDENDUM:
Critical Value/emergent results were called by telephone at the time
of interpretation on 11/10/2014 at [DATE] to Sabine Abbasi, RN, who
verbally acknowledged these results.
CLINICAL DATA: Lower extremity edema and pain for 1 week

EXAM:
BILATERAL LOWER EXTREMITY VENOUS DUPLEX ULTRASOUND
TECHNIQUE: Gray-scale sonography with graded compression, as well as color
Doppler and duplex ultrasound were performed to evaluate the lower
extremity deep venous systems from the level of the common femoral
vein and including the common femoral, femoral, profunda femoral,
popliteal and calf veins including the posterior tibial, peroneal
and gastrocnemius veins when visible. The superficial great
saphenous vein was also interrogated. Spectral Doppler was utilized
to evaluate flow at rest and with distal augmentation maneuvers in
the common femoral, femoral and popliteal veins.

[Series 1: us extrem low bilat comp · 0.09mm/px · 12 of 79 slices shown]
[im 4/79]
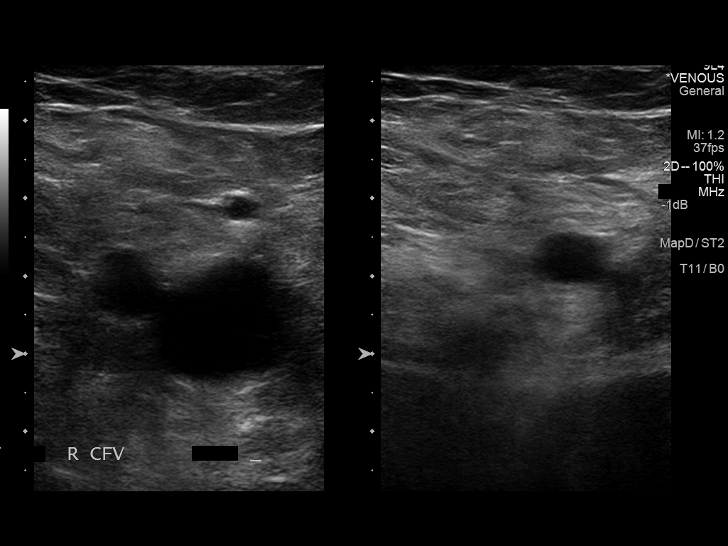
[im 10/79]
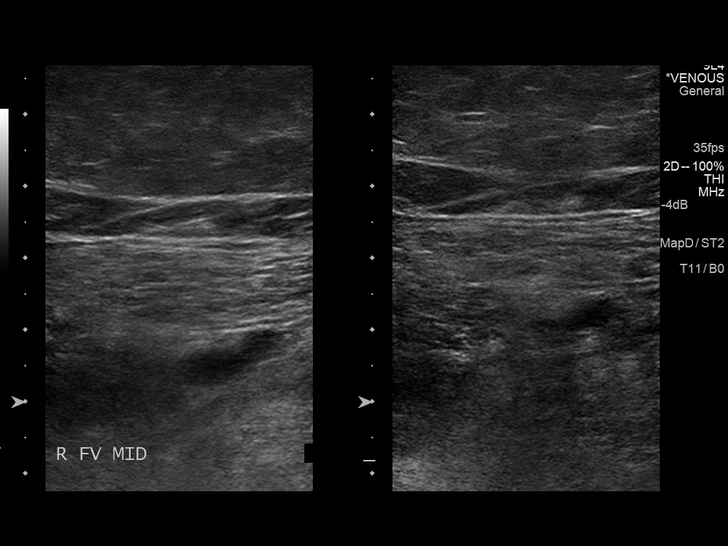
[im 17/79]
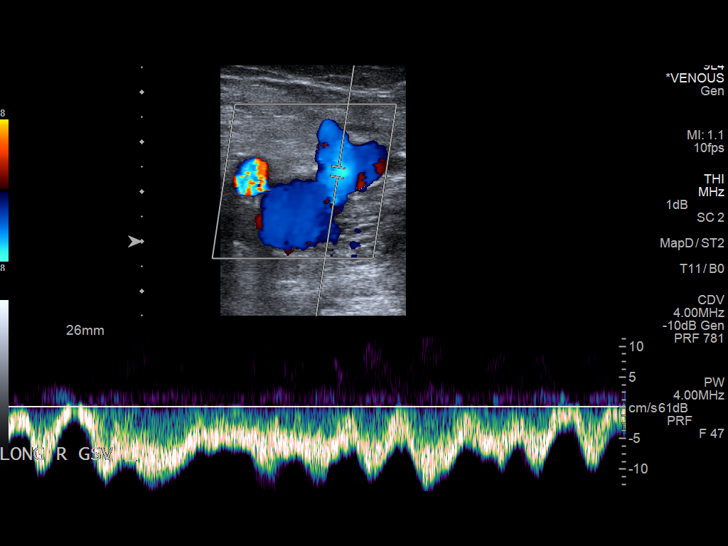
[im 23/79]
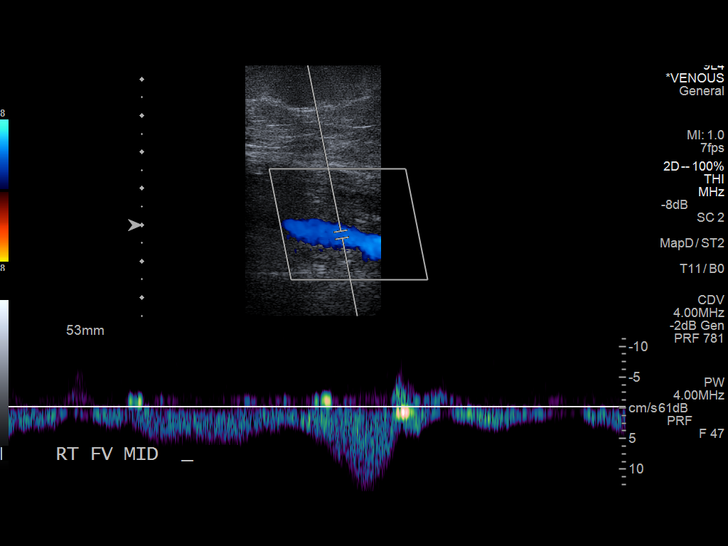
[im 30/79]
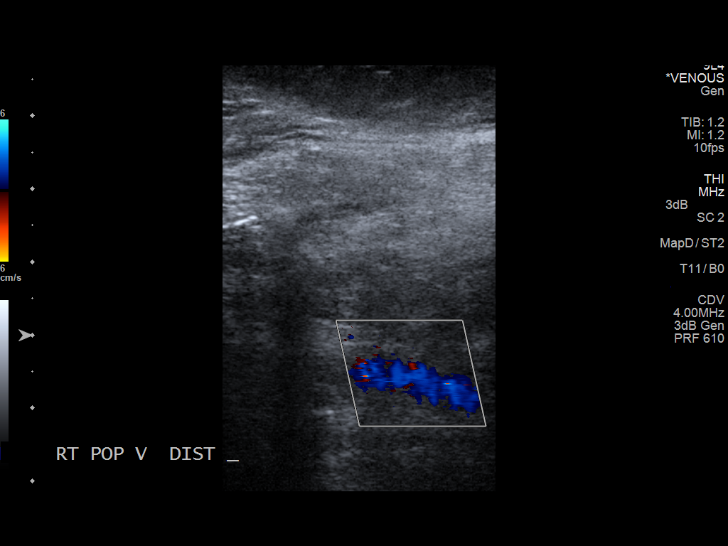
[im 36/79]
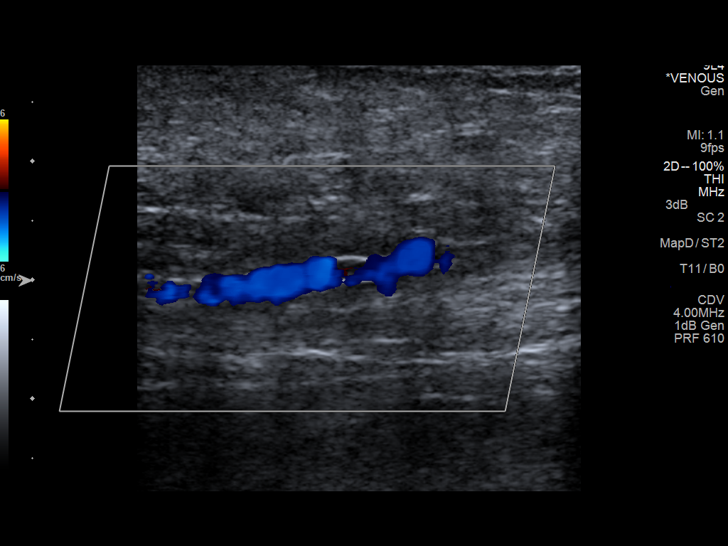
[im 43/79]
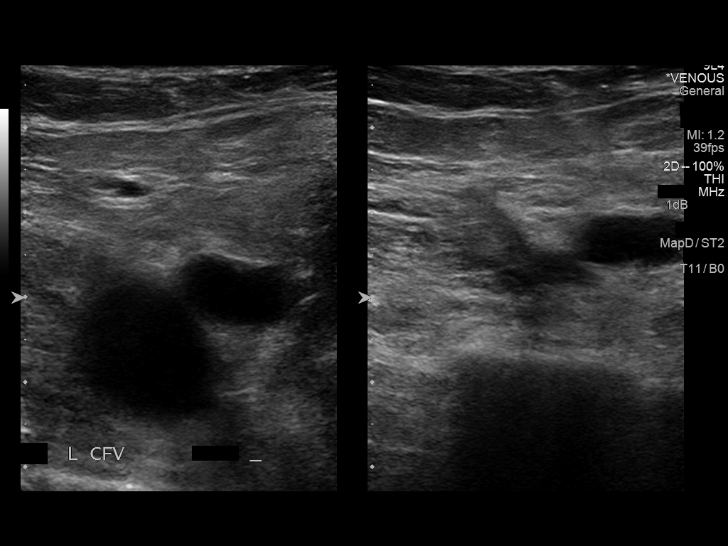
[im 49/79]
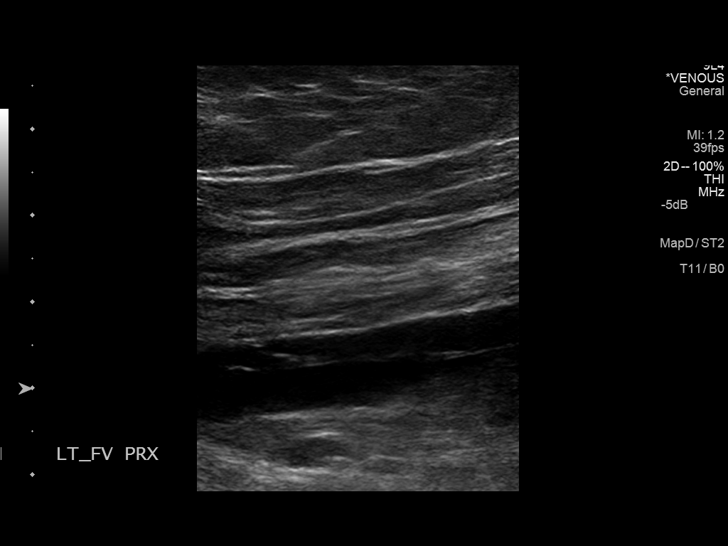
[im 56/79]
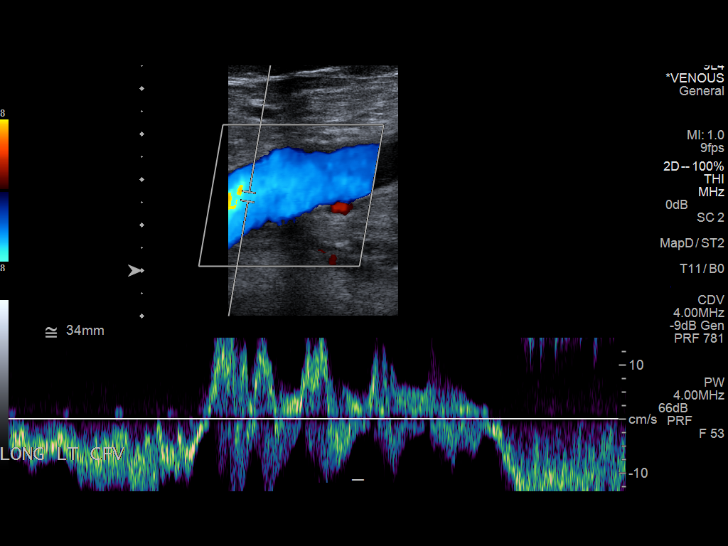
[im 62/79]
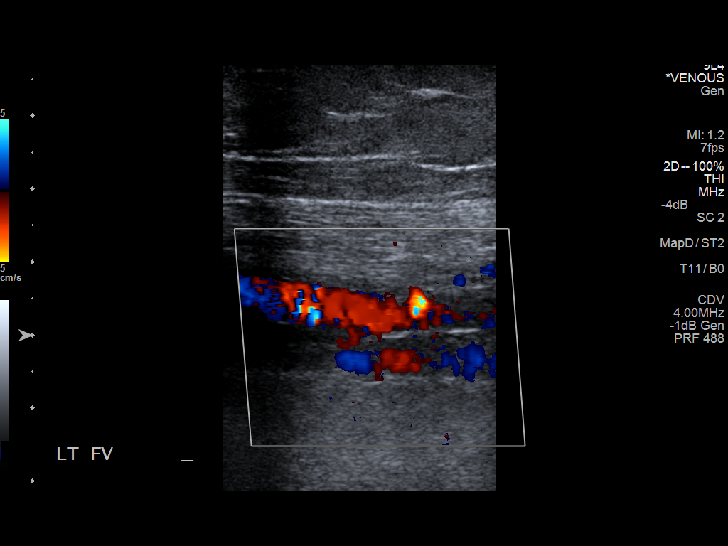
[im 69/79]
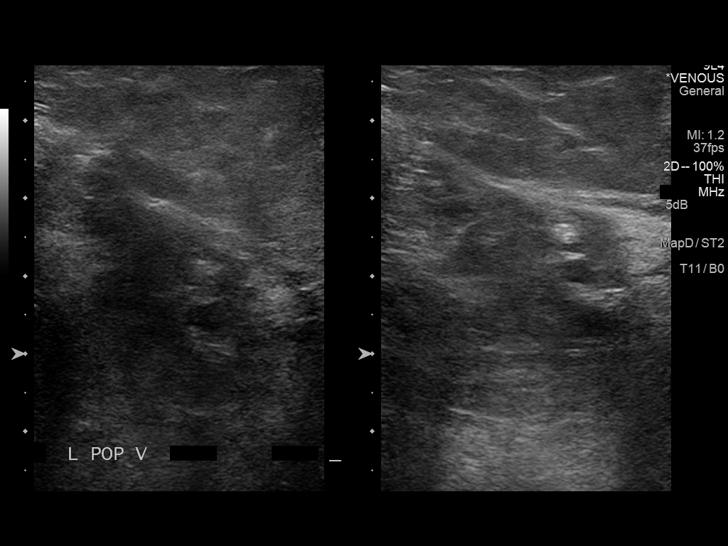
[im 75/79]
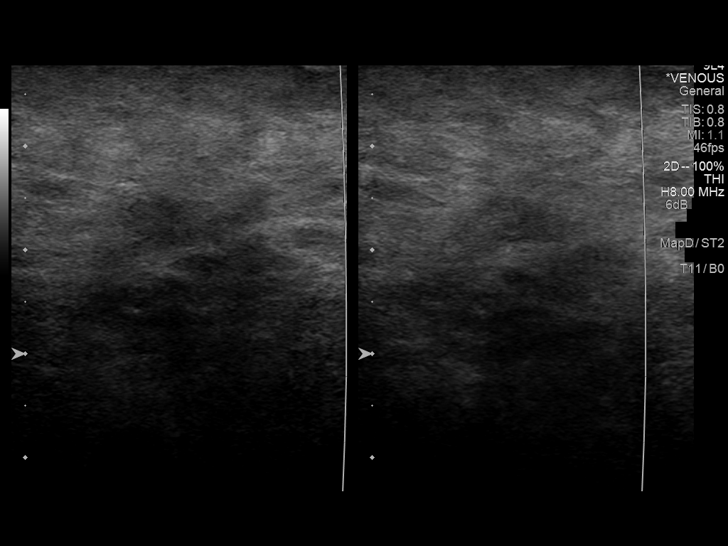

[12 of 25 positions shown; findings below may reference images not displayed]

FINDINGS: RIGHT LOWER EXTREMITY

Common Femoral Vein: No evidence of thrombus. Normal
compressibility, respiratory phasicity and response to augmentation.

Saphenofemoral Junction: No evidence of thrombus. Normal
compressibility and flow on color Doppler imaging.

Profunda Femoral Vein: No evidence of thrombus. Normal
compressibility and flow on color Doppler imaging.

Femoral Vein: No evidence of thrombus. Normal compressibility,
respiratory phasicity and response to augmentation.

Popliteal Vein: No evidence of thrombus. Normal compressibility,
respiratory phasicity and response to augmentation.

Calf Veins: No evidence of thrombus. Normal compressibility and flow
on color Doppler imaging. Note that the peroneal vein is not well
visualized due to soft tissue edema.

Superficial Great Saphenous Vein: No evidence of thrombus. Normal
compressibility and flow on color Doppler imaging.

Venous Reflux:  None.

Other Findings:  None.

LEFT LOWER EXTREMITY

Common Femoral Vein: No evidence of thrombus. Normal
compressibility, respiratory phasicity and response to augmentation.

Saphenofemoral Junction: No evidence of thrombus. Normal
compressibility and flow on color Doppler imaging.

Profunda Femoral Vein: No evidence of thrombus. Normal
compressibility and flow on color Doppler imaging.

Femoral Vein: There is incompletely obstructing thrombus in the
proximal left femoral vein. There is diminished compression and lack
of augmentation focally in this area. The remainder of the left
common femoral vein is somewhat diminutive without obvious thrombus.

Popliteal Vein: No evidence of thrombus. Normal compressibility,
respiratory phasicity and response to augmentation.

Calf Veins: No evidence of thrombus. Normal compressibility and flow
on color Doppler imaging. Peroneal vein not well visualized due to
soft tissue edema.

Superficial Great Saphenous Vein: No evidence of thrombus. Normal
compressibility and flow on color Doppler imaging.

Venous Reflux:  None.

Other Findings:  None.
IMPRESSION: Incompletely obstructing thrombus in the proximal left femoral vein.
No other appreciable areas of deep venous thrombosis. Neither
peroneal vein well seen due to soft tissue edema.

## 2016-05-05 ENCOUNTER — Ambulatory Visit
Admission: RE | Admit: 2016-05-05 | Discharge: 2016-05-05 | Disposition: A | Discharge status: Home / Self Care | Payer: Medicare Other | Source: Ambulatory Visit | Attending: Family Medicine | Admitting: Family Medicine

## 2016-05-05 ENCOUNTER — Other Ambulatory Visit: Payer: Self-pay | Admitting: Family Medicine

## 2016-05-05 DIAGNOSIS — N6320 Unspecified lump in the left breast, unspecified quadrant: Secondary | ICD-10-CM

## 2016-05-05 DIAGNOSIS — N6321 Unspecified lump in the left breast, upper outer quadrant: Secondary | ICD-10-CM | POA: Diagnosis not present

## 2016-05-06 LAB — SURGICAL PATHOLOGY

## 2016-05-21 ENCOUNTER — Other Ambulatory Visit: Payer: Self-pay | Admitting: Family Medicine

## 2016-06-25 ENCOUNTER — Other Ambulatory Visit: Payer: Self-pay | Admitting: Family Medicine

## 2016-06-26 ENCOUNTER — Encounter (INDEPENDENT_AMBULATORY_CARE_PROVIDER_SITE_OTHER): Payer: Self-pay

## 2016-07-03 ENCOUNTER — Other Ambulatory Visit (INDEPENDENT_AMBULATORY_CARE_PROVIDER_SITE_OTHER): Payer: Self-pay | Admitting: Vascular Surgery

## 2016-07-09 ENCOUNTER — Encounter
Admission: RE | Disposition: A | Discharge status: Home / Self Care | Payer: Self-pay | Source: Ambulatory Visit | Attending: Vascular Surgery

## 2016-07-09 ENCOUNTER — Ambulatory Visit
Admission: RE | Admit: 2016-07-09 | Discharge: 2016-07-09 | Disposition: A | Discharge status: Home / Self Care | Payer: Medicare Other | Source: Ambulatory Visit | Attending: Vascular Surgery | Admitting: Vascular Surgery

## 2016-07-09 DIAGNOSIS — Z888 Allergy status to other drugs, medicaments and biological substances status: Secondary | ICD-10-CM | POA: Diagnosis not present

## 2016-07-09 DIAGNOSIS — Z86718 Personal history of other venous thrombosis and embolism: Secondary | ICD-10-CM | POA: Diagnosis not present

## 2016-07-09 DIAGNOSIS — Z955 Presence of coronary angioplasty implant and graft: Secondary | ICD-10-CM | POA: Insufficient documentation

## 2016-07-09 DIAGNOSIS — Z9851 Tubal ligation status: Secondary | ICD-10-CM | POA: Diagnosis not present

## 2016-07-09 DIAGNOSIS — Z820 Family history of epilepsy and other diseases of the nervous system: Secondary | ICD-10-CM | POA: Insufficient documentation

## 2016-07-09 DIAGNOSIS — Z9049 Acquired absence of other specified parts of digestive tract: Secondary | ICD-10-CM | POA: Insufficient documentation

## 2016-07-09 DIAGNOSIS — M1990 Unspecified osteoarthritis, unspecified site: Secondary | ICD-10-CM | POA: Insufficient documentation

## 2016-07-09 DIAGNOSIS — T82858A Stenosis of vascular prosthetic devices, implants and grafts, initial encounter: Secondary | ICD-10-CM | POA: Diagnosis present

## 2016-07-09 DIAGNOSIS — Z87891 Personal history of nicotine dependence: Secondary | ICD-10-CM | POA: Insufficient documentation

## 2016-07-09 DIAGNOSIS — Z9889 Other specified postprocedural states: Secondary | ICD-10-CM | POA: Insufficient documentation

## 2016-07-09 DIAGNOSIS — Z86010 Personal history of colonic polyps: Secondary | ICD-10-CM | POA: Diagnosis not present

## 2016-07-09 DIAGNOSIS — Z96652 Presence of left artificial knee joint: Secondary | ICD-10-CM | POA: Diagnosis not present

## 2016-07-09 DIAGNOSIS — Y832 Surgical operation with anastomosis, bypass or graft as the cause of abnormal reaction of the patient, or of later complication, without mention of misadventure at the time of the procedure: Secondary | ICD-10-CM | POA: Insufficient documentation

## 2016-07-09 DIAGNOSIS — I5032 Chronic diastolic (congestive) heart failure: Secondary | ICD-10-CM | POA: Diagnosis not present

## 2016-07-09 DIAGNOSIS — Z8249 Family history of ischemic heart disease and other diseases of the circulatory system: Secondary | ICD-10-CM | POA: Insufficient documentation

## 2016-07-09 DIAGNOSIS — Z86711 Personal history of pulmonary embolism: Secondary | ICD-10-CM | POA: Diagnosis not present

## 2016-07-09 DIAGNOSIS — Z992 Dependence on renal dialysis: Secondary | ICD-10-CM | POA: Diagnosis not present

## 2016-07-09 DIAGNOSIS — Z952 Presence of prosthetic heart valve: Secondary | ICD-10-CM | POA: Diagnosis not present

## 2016-07-09 DIAGNOSIS — M8580 Other specified disorders of bone density and structure, unspecified site: Secondary | ICD-10-CM | POA: Diagnosis not present

## 2016-07-09 DIAGNOSIS — E785 Hyperlipidemia, unspecified: Secondary | ICD-10-CM | POA: Insufficient documentation

## 2016-07-09 DIAGNOSIS — K219 Gastro-esophageal reflux disease without esophagitis: Secondary | ICD-10-CM | POA: Diagnosis not present

## 2016-07-09 DIAGNOSIS — I10 Essential (primary) hypertension: Secondary | ICD-10-CM | POA: Diagnosis not present

## 2016-07-09 DIAGNOSIS — N2581 Secondary hyperparathyroidism of renal origin: Secondary | ICD-10-CM | POA: Insufficient documentation

## 2016-07-09 DIAGNOSIS — I2510 Atherosclerotic heart disease of native coronary artery without angina pectoris: Secondary | ICD-10-CM | POA: Diagnosis not present

## 2016-07-09 DIAGNOSIS — D631 Anemia in chronic kidney disease: Secondary | ICD-10-CM | POA: Insufficient documentation

## 2016-07-09 DIAGNOSIS — N186 End stage renal disease: Secondary | ICD-10-CM | POA: Insufficient documentation

## 2016-07-09 DIAGNOSIS — I132 Hypertensive heart and chronic kidney disease with heart failure and with stage 5 chronic kidney disease, or end stage renal disease: Secondary | ICD-10-CM | POA: Diagnosis not present

## 2016-07-09 DIAGNOSIS — Z833 Family history of diabetes mellitus: Secondary | ICD-10-CM | POA: Insufficient documentation

## 2016-07-09 DIAGNOSIS — T82868A Thrombosis of vascular prosthetic devices, implants and grafts, initial encounter: Secondary | ICD-10-CM | POA: Diagnosis not present

## 2016-07-09 DIAGNOSIS — Z823 Family history of stroke: Secondary | ICD-10-CM | POA: Insufficient documentation

## 2016-07-09 DIAGNOSIS — Z800 Family history of malignant neoplasm of digestive organs: Secondary | ICD-10-CM | POA: Insufficient documentation

## 2016-07-09 DIAGNOSIS — J449 Chronic obstructive pulmonary disease, unspecified: Secondary | ICD-10-CM | POA: Insufficient documentation

## 2016-07-09 DIAGNOSIS — Z803 Family history of malignant neoplasm of breast: Secondary | ICD-10-CM | POA: Insufficient documentation

## 2016-07-09 HISTORY — PX: A/V FISTULAGRAM: CATH118298

## 2016-07-09 LAB — POTASSIUM (ARMC VASCULAR LAB ONLY): POTASSIUM (ARMC VASCULAR LAB): 3.5 (ref 3.5–5.1)

## 2016-07-09 SURGERY — A/V FISTULAGRAM
Anesthesia: Moderate Sedation | Laterality: Left

## 2016-07-09 MED ORDER — IOPAMIDOL (ISOVUE-300) INJECTION 61%
INTRAVENOUS | Status: DC | PRN
  Administered 2016-07-09: 30 mL via INTRA_ARTERIAL

## 2016-07-09 MED ORDER — PHENOL 1.4 % MT LIQD: 1.0000 | OROMUCOSAL | Status: DC | PRN

## 2016-07-09 MED ORDER — LABETALOL HCL 5 MG/ML IV SOLN: 10.0000 mg | INTRAVENOUS | Status: DC | PRN

## 2016-07-09 MED ORDER — METOPROLOL TARTRATE 5 MG/5ML IV SOLN: 2.0000 mg | INTRAVENOUS | Status: DC | PRN

## 2016-07-09 MED ORDER — ONDANSETRON HCL 4 MG/2ML IJ SOLN: 4.0000 mg | Freq: Four times a day (QID) | INTRAMUSCULAR | Status: DC | PRN

## 2016-07-09 MED ORDER — FAMOTIDINE 20 MG PO TABS: 40.0000 mg | ORAL_TABLET | ORAL | Status: DC | PRN

## 2016-07-09 MED ORDER — LIDOCAINE-EPINEPHRINE (PF) 2 %-1:200000 IJ SOLN
INTRAMUSCULAR | Status: AC
  Filled 2016-07-09: qty 20

## 2016-07-09 MED ORDER — ALUM & MAG HYDROXIDE-SIMETH 200-200-20 MG/5ML PO SUSP: 15.0000 mL | ORAL | Status: DC | PRN

## 2016-07-09 MED ORDER — MIDAZOLAM HCL 2 MG/2ML IJ SOLN
INTRAMUSCULAR | Status: DC | PRN
  Administered 2016-07-09: 2 mg via INTRAVENOUS
  Administered 2016-07-09: 1 mg via INTRAVENOUS

## 2016-07-09 MED ORDER — FENTANYL CITRATE (PF) 100 MCG/2ML IJ SOLN
INTRAMUSCULAR | Status: DC | PRN
  Administered 2016-07-09 (×2): 50 ug via INTRAVENOUS

## 2016-07-09 MED ORDER — CEFAZOLIN IN D5W 1 GM/50ML IV SOLN: 1.0000 g | Freq: Once | INTRAVENOUS | Status: DC

## 2016-07-09 MED ORDER — OXYCODONE-ACETAMINOPHEN 5-325 MG PO TABS: 1.0000 | ORAL_TABLET | ORAL | Status: DC | PRN

## 2016-07-09 MED ORDER — SODIUM CHLORIDE 0.9 % IV SOLN: 500.0000 mL | Freq: Once | INTRAVENOUS | Status: DC | PRN

## 2016-07-09 MED ORDER — GUAIFENESIN-DM 100-10 MG/5ML PO SYRP: 15.0000 mL | ORAL_SOLUTION | ORAL | Status: DC | PRN

## 2016-07-09 MED ORDER — MORPHINE SULFATE (PF) 4 MG/ML IV SOLN: 2.0000 mg | INTRAVENOUS | Status: DC | PRN

## 2016-07-09 MED ORDER — HYDRALAZINE HCL 20 MG/ML IJ SOLN: 5.0000 mg | INTRAMUSCULAR | Status: DC | PRN

## 2016-07-09 MED ORDER — ACETAMINOPHEN 325 MG PO TABS: 325.0000 mg | ORAL_TABLET | ORAL | Status: DC | PRN

## 2016-07-09 MED ORDER — METHYLPREDNISOLONE SODIUM SUCC 125 MG IJ SOLR: 125.0000 mg | INTRAMUSCULAR | Status: DC | PRN

## 2016-07-09 MED ORDER — CEFUROXIME SODIUM 1.5 G IJ SOLR
1.5000 g | Freq: Once | INTRAMUSCULAR | Status: AC
  Administered 2016-07-09: 1.5 g via INTRAVENOUS

## 2016-07-09 MED ORDER — SODIUM CHLORIDE 0.9 % IV SOLN
INTRAVENOUS | Status: DC
  Administered 2016-07-09: 08:00:00 via INTRAVENOUS

## 2016-07-09 MED ORDER — MIDAZOLAM HCL 5 MG/5ML IJ SOLN
INTRAMUSCULAR | Status: AC
  Filled 2016-07-09: qty 5

## 2016-07-09 MED ORDER — HEPARIN SODIUM (PORCINE) 1000 UNIT/ML IJ SOLN
INTRAMUSCULAR | Status: AC
  Filled 2016-07-09: qty 1

## 2016-07-09 MED ORDER — HYDROMORPHONE HCL 1 MG/ML IJ SOLN: 1.0000 mg | Freq: Once | INTRAMUSCULAR | Status: DC

## 2016-07-09 MED ORDER — FENTANYL CITRATE (PF) 100 MCG/2ML IJ SOLN
INTRAMUSCULAR | Status: AC
  Filled 2016-07-09: qty 2

## 2016-07-09 MED ORDER — ACETAMINOPHEN 325 MG RE SUPP
325.0000 mg | RECTAL | Status: DC | PRN
  Filled 2016-07-09: qty 2

## 2016-07-09 SURGICAL SUPPLY — 6 items
CANNULA 5F STIFF (CANNULA) ×2 IMPLANT
DRAPE BRACHIAL (DRAPES) ×2 IMPLANT
PACK ANGIOGRAPHY (CUSTOM PROCEDURE TRAY) ×2 IMPLANT
SHEATH BRITE TIP 6FRX5.5 (SHEATH) ×2 IMPLANT
SUT MNCRL AB 4-0 PS2 18 (SUTURE) ×2 IMPLANT
TOWEL OR 17X26 4PK STRL BLUE (TOWEL DISPOSABLE) ×2 IMPLANT

## 2016-07-09 NOTE — H&P (Signed)
[facility] Admission History & Physical  MRN : [id number]  Ariel Smith is a 71 y.o. (1945-05-01) female who presents with chief complaint of No chief complaint on file. Marland Kitchen  History of Present Illness: I am asked to evaluate the patient by the dialysis center. The patient was sent here because they were unable to achieve adequate dialysis this morning. Furthermore the Center states there is very poor thrill and bruit. The patient states there there have been increasing problems with the access such as low flow. The patient estimates these problems have been going on for several weeks. The patient is unaware of any other change.  Patient denies pain or tenderness overlying the access.  There is no pain with dialysis.  The patient denies hand pain or finger pain consistent with steal syndrome.   There have been several past interventions or declots of this access.  The patient is not chronically hypotensive on dialysis.  Current Facility-Administered Medications  Medication Dose Route Frequency Provider Last Rate Last Dose  . 0.9 %  sodium chloride infusion   Intravenous Continuous Kimberly A Stegmayer, PA-C      . ceFAZolin (ANCEF) IVPB 1 g/50 mL premix  1 g Intravenous Once [employer], PA-C      . famotidine (PEPCID) tablet 40 mg  40 mg Oral PRN Ranae Plumber Stegmayer, PA-C      . HYDROmorphone (DILAUDID) injection 1 mg  1 mg Intravenous Once [employer], PA-C      . methylPREDNISolone sodium succinate (SOLU-MEDROL) 125 mg/2 mL injection 125 mg  125 mg Intravenous PRN Kimberly A Stegmayer, PA-C      . ondansetron (ZOFRAN) injection 4 mg  4 mg Intravenous Q6H PRN Tonette Lederer, PA-C        Past Medical History:  Diagnosis Date  . Acute DVT (deep venous thrombosis) (HCC) 2017   hospitalization  . Anemia of chronic disease   . Bronchiectasis (HCC) 08/2014    suggested by thoracic xray  . Chronic diastolic CHF (congestive heart failure)  (HCC)    a. echo 10/2014: EF 60-65%, no RWMA, GR1DD, severe AS, mild MR, PASP 46 mm Hg  . Chronic respiratory failure (HCC)    a. 2/2 COPD; b. on 4-5L via nasal cannula  . COPD (chronic obstructive pulmonary disease) (HCC)   . Depression   . ESRD (end stage renal disease) (HCC) 08/2011   a. on HD (TThSa), L forearm AV fistula, [name]  . Frequent headaches   . GERD (gastroesophageal reflux disease)   . GIB (gastrointestinal bleeding)    a. leading to cessation of warfarin 11/2014  . Heart murmur   . History of colon polyps 2013   colonoscopy ([name])  . History of DVT of lower extremity 2009, 2017   left sided x2, with PE s/p IVC filter placement, coumadin stopped 2/2 GI bleed  . HLD (hyperlipidemia)   . HTN (hypertension)   . Morbid obesity (HCC)   . Osteoarthritis   . Osteopenia 01/2013  . Pulmonary embolism (HCC) 2009  . Renal failure    Patient has been on dialysis for " four years" -per patient.  . 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 Secondary hyperparathyroidism of renal origin (HCC)   . Severe aortic stenosis    a. echo 10/2014: EF 60-65%, no RWMA, GR1DD, mod to sev AS (peak  vel 377 cm/s, mean gradient 34 mm Hg, peak gradient 57 mm Hg, valve area (VTA) 0.72 cm^2    Past Surgical History:  Procedure Laterality Date  . BUNIONECTOMY Left 2003  . CARDIAC CATHETERIZATION N/A 01/18/2015   patent coronary arteries without significant osbtruction and preserved LV function, severe aortic stenosis; Procedure: Right/Left Heart Cath and Coronary Angiography;  Surgeon: Tonny Bollman, MD  . CHOLECYSTECTOMY  2012  . COLONOSCOPY  08/2011   colon biopsies, [name]  . ESOPHAGOGASTRODUODENOSCOPY  08/2011   gastric cardia polyp  . ESOPHAGOGASTRODUODENOSCOPY Left 11/11/2014   Procedure: ESOPHAGOGASTRODUODENOSCOPY (EGD);  Surgeon: [name], MD;  Location:  [facility] ENDOSCOPY;  Service: Endoscopy;  Laterality: Left;  . EXTERIORIZATION OF A CONTINUOUS AMBULATORY PERITONEAL DIALYSIS CATHETER  01/2013   removal 12/2103 - [name]  . hospitalization  12/2013   recurrent R pleural effusion due to peritoneal fluid translocation s/p rpt thoracentesis with 1.3 L fluid removed, ERSD started on HD this hospitalization  . PERIPHERAL VASCULAR CATHETERIZATION N/A 11/12/2014   Procedure: IVC Filter Insertion;  Surgeon: [name], MD;  Location: [facility] INVASIVE CV LAB;  Service: Cardiovascular;  Laterality: N/A;  . PERIPHERAL VASCULAR CATHETERIZATION Left 10/24/2015   Procedure: Lower Extremity Venography with intervention (thormbolysis/thrombectomy);  Surgeon: [name], MD;  Location: [facility] INVASIVE CV LAB;  Service: Cardiovascular;  Laterality: Left;  . REPLACEMENT TOTAL KNEE Left 2006  . SHOULDER ARTHROSCOPY Right 2009  . TEE WITHOUT CARDIOVERSION N/A 02/19/2015   Procedure: TRANSESOPHAGEAL ECHOCARDIOGRAM (TEE);  Surgeon: Tonny Bollman, MD;  Location: [facility] OR;  Service: Open Heart Surgery;  Laterality: N/A;  . TONSILLECTOMY  1955  . TRANSCATHETER AORTIC VALVE REPLACEMENT, TRANSFEMORAL Right 02/19/2015   Procedure: TRANSCATHETER AORTIC VALVE REPLACEMENT, TRANSFEMORAL;  Surgeon: Tonny Bollman, MD;  Location: [facility] OR;  Service: Open Heart Surgery;  Laterality: Right;  . TUBAL LIGATION  1980  . US ECHOCARDIOGRAPHY  12/2013   EF 55-60%, nl LV sys fxn, mild-mod MR, AS, increased LV posterior wall thickness, mild TR    Social History Social History  Substance Use Topics  . Smoking status: Former Smoker    Packs/day: 2.00    Years: 30.00    Types: Cigarettes    Start date: 04/20/1978    Quit date: 04/21/2007  . Smokeless tobacco: Never Used  . Alcohol use No    Family History Family History  Problem Relation Age of Onset  . Deep vein thrombosis Brother   . Cancer Mother 4    colon  . Stroke Mother   . Cancer Father     prostate  . Hypertension Father   .  Dementia Father 2  . Hypertension Brother   . Breast cancer Daughter 56  . Diabetes Neg Hx   . CAD Neg Hx     No family history of bleeding or clotting disorders, autoimmune disease or porphyria  Allergies  Allergen Reactions  . Nodolor [Isometheptene-Dichloral-Apap] Other (See Comments)    Reaction:  Headaches      REVIEW OF SYSTEMS (Negative unless checked)  Constitutional: [] Weight loss  [] Fever  [] Chills Cardiac: [] Chest pain   [] Chest pressure   [] Palpitations   [] Shortness of breath when laying flat   [] Shortness of breath at rest   [x] Shortness of breath with exertion. Vascular:  [] Pain in legs with walking   [] Pain in legs at rest   [] Pain in legs when laying flat   [] Claudication   [] Pain in feet when walking  [] Pain in feet at rest  []   Pain in feet when laying flat   [x] History of DVT   [] Phlebitis   [x] Swelling in legs   [] Varicose veins   [] Non-healing ulcers Pulmonary:   [] Uses home oxygen   [] Productive cough   [] Hemoptysis   [] Wheeze  [x] COPD   [] Asthma Neurologic:  [] Dizziness  [] Blackouts   [] Seizures   [] History of stroke   [] History of TIA  [] Aphasia   [] Temporary blindness   [] Dysphagia   [] Weakness or numbness in arms   [] Weakness or numbness in legs Musculoskeletal:  [] Arthritis   [] Joint swelling   [] Joint pain   [] Low back pain Hematologic:  [] Easy bruising  [] Easy bleeding   [x] Hypercoagulable state   [] Anemic  [] Hepatitis Gastrointestinal:  [] Blood in stool   [] Vomiting blood  [] Gastroesophageal reflux/heartburn   [] Difficulty swallowing. Genitourinary:  [x] Chronic kidney disease   [] Difficult urination  [] Frequent urination  [] Burning with urination   [] Blood in urine Skin:  [] Rashes   [] Ulcers   [] Wounds Psychological:  [] History of anxiety   []  History of major depression.  Physical Examination  There were no vitals filed for this visit. There is no height or weight on file to calculate BMI. Gen: WD/WN, NAD. obese Head: [place]/AT, No temporalis wasting.  Prominent temp pulse not noted. Ear/Nose/Throat: Hearing grossly intact, nares w/o erythema or drainage, oropharynx w/o Erythema/Exudate,  Eyes: Conjunctiva clear, sclera non-icteric Neck: Trachea midline.  No JVD.  Pulmonary:  Good air movement, respirations not labored, no use of accessory muscles.  Cardiac: RRR, normal S1, S2. Vascular: thrill and bruit are present in AVF Vessel Right Left  Radial Palpable Palpable  Ulnar Not Palpable Not Palpable  Brachial Palpable Palpable  Carotid Palpable, without bruit Palpable, without bruit  Gastrointestinal: soft, non-tender/non-distended. No guarding/reflex.  Musculoskeletal: M/S 5/5 throughout.  Extremities without ischemic changes.  No deformity or atrophy. 2+ LE edema Neurologic: Sensation grossly intact in extremities.  Symmetrical.  Speech is fluent. Motor exam as listed above. Psychiatric: Judgment intact, Mood & affect appropriate for pt's clinical situation. Dermatologic: No rashes or ulcers noted.  No cellulitis or open wounds. Lymph : No Cervical, Axillary, or Inguinal lymphadenopathy.   CBC Lab Results  Component Value Date   WBC 5.5 10/24/2015   HGB 11.0 (L) 10/24/2015   HCT 33.1 (L) 10/24/2015   MCV 100.2 (H) 10/24/2015   PLT 148 (L) 10/24/2015    BMET    Component Value Date/Time   NA 136 10/25/2015 0452   NA 142 01/11/2014 1559   K 5.6 (H) 10/25/2015 0452   K 3.7 01/11/2014 1559   K 4.3 08/10/2011   CL 95 (L) 10/25/2015 0452   CL 109 (H) 01/11/2014 1559   CO2 30 10/25/2015 0452   CO2 24 01/11/2014 1559   GLUCOSE 97 10/25/2015 0452   GLUCOSE 106 (H) 01/11/2014 1559   BUN 58 (H) 10/25/2015 0452   BUN 42 (H) 01/11/2014 1559   CREATININE 8.34 (H) 10/25/2015 0452   CREATININE 6.17 (H) 03/30/2014 1724   CALCIUM 9.2 10/25/2015 0452   CALCIUM 8.5 01/11/2014 1559   CALCIUM 8.4 06/02/2013   GFRNONAA 4 (L) 10/25/2015 0452   GFRNONAA 7 (L) 01/11/2014 1559   GFRNONAA 5 (L) 11/21/2013 0027   GFRAA 5 (L) 10/25/2015  0452   GFRAA 9 (L) 01/11/2014 1559   GFRAA 5 (L) 11/21/2013 0027   CrCl cannot be calculated (Patient's most recent lab result is older than the maximum 21 days allowed.).  COAG Lab Results  Component Value Date  INR 1.17 10/23/2015   INR 1.06 10/11/2015   INR 1.26 02/19/2015    Radiology No results found.  Assessment/Plan 1.  Complication dialysis device with thrombosis AV access:  Patient's dialysis access is malfunctioning. The patient will undergo angiography and correction of any problems using interventional techniques with the hope of restoring function to the access.  The risks and benefits were described to the patient.  All questions were answered.  The patient agrees to proceed with angiography and intervention. Potassium will be drawn to ensure that it is an appropriate level prior to performing intervention. 2.  End-stage renal disease requiring hemodialysis:  Patient will continue dialysis therapy without further interruption if a successful intervention is not achieved then a tunneled catheter will be placed. Dialysis has already been arranged. 3.  Hypertension:  Patient will continue medical management; nephrology is following no changes in oral medications. 4.  Coronary artery disease:  EKG will be monitored. Nitrates will be used if needed. The patient's oral cardiac medications will be continued. 5. Hypercoagulable state.  Remains on anticoagulation.     Festus Barren, MD  07/09/2016 8:11 AM

## 2016-07-09 NOTE — Op Note (Signed)
[facility]    OPERATIVE NOTE   PROCEDURE: 1.   Left radiocephalic arteriovenous fistula cannulation under ultrasound guidance 2.   Left arm fistulagram including central venogram   PRE-OPERATIVE DIAGNOSIS: 1. ESRD 2. Low transonic flow at dialysis  POST-OPERATIVE DIAGNOSIS: same as above   SURGEON: Festus Barren, MD  ANESTHESIA: local with MCS  ESTIMATED BLOOD LOSS: minimal  FINDING(S): 1. Mild stenosis in the cephalic vein just beyond the anastomosis. Although this is less than half the size of the large, mature fistula at the access sites and in the forearm the degree of narrowing relative to the radial artery and the normal size of the cephalic vein at the anastomosis only appeared to be in the 30-40% range. The outflow in the upper arm was through the basilic and deep venous system in the central venous circulation was patent.  SPECIMEN(S):  None  CONTRAST: 30 cc  FLUORO TIME: Less than 1 minute  MODERATE CONSCIOUS SEDATION TIME: Approximately 15 minutes with 3 mg of Versed and 75 mcg of Fentanyl   INDICATIONS: Ariel Smith is a 71 y.o. female who presents with low flows in her arteriovenous fistula at dialysis.  The patient is scheduled for left arm fistulagram.  The patient is aware the risks include but are not limited to: bleeding, infection, thrombosis of the cannulated access, and possible anaphylactic reaction to the contrast.  The patient is aware of the risks of the procedure and elects to proceed forward.  DESCRIPTION: After full informed written consent was obtained, the patient was brought back to the angiography suite and placed supine upon the angiography table.  The patient was connected to monitoring equipment. Moderate conscious sedation was administered with a face to face encounter with the patient throughout the procedure with my supervision of the RN administering medicines and monitoring the patient's vital signs and mental status  throughout from the start of the procedure until the patient was taken to the recovery room. The left arm was prepped and draped in the standard fashion for a percutaneous access intervention.  Under ultrasound guidance, the left radiocephalic arteriovenous fistula was cannulated with a micropuncture needle under direct ultrasound guidance at the access site and a permanent image was performed.  The microwire was advanced into the fistula and the needle was exchanged for the a microsheath.  Hand injections were completed to image the access including the central venous system. This demonstrated mild stenosis in the cephalic vein just beyond the anastomosis. Although this is less than half the size of the large, mature fistula at the access sites and in the forearm the degree of narrowing relative to the radial artery and the normal size of the cephalic vein at the anastomosis only appeared to be in the 30-40% range. The outflow in the upper arm was through the basilic and deep venous system in the central venous circulation was patent.  Based on the images, this patient will need no intervention. A 4-0 Monocryl purse-string suture was sewn around the sheath.  The sheath was removed while tying down the suture.  A sterile bandage was applied to the puncture site.  COMPLICATIONS: None  CONDITION: Stable   Festus Barren  07/09/2016 9:36 AM   This note was created with Dragon Medical transcription system. Any errors in dictation are purely unintentional.

## 2016-07-10 ENCOUNTER — Encounter: Payer: Self-pay | Admitting: Vascular Surgery

## 2016-08-01 ENCOUNTER — Other Ambulatory Visit: Payer: Self-pay | Admitting: Cardiovascular Disease

## 2016-08-04 NOTE — Telephone Encounter (Signed)
Pt was previously on Plavix & aspirin. Since her last ov, another MD has switched her to Eliquis. Do you want her to be on Plavix & Eliquis?

## 2016-08-04 NOTE — Telephone Encounter (Signed)
Would [name] be okay with refilling this medication since he did not prescribe? Patient was last seen 01/17/2016 and does not have a follow up appointment.

## 2016-08-05 NOTE — Telephone Encounter (Signed)
She is on eliquis 2.5 twice a day, DVT prophylaxis dose Plavix 75 mg daily I can prescribe this

## 2016-08-06 ENCOUNTER — Other Ambulatory Visit: Payer: Self-pay | Admitting: Family Medicine

## 2016-08-06 ENCOUNTER — Ambulatory Visit: Payer: Medicare Other

## 2016-08-06 DIAGNOSIS — E559 Vitamin D deficiency, unspecified: Secondary | ICD-10-CM

## 2016-08-06 DIAGNOSIS — N186 End stage renal disease: Secondary | ICD-10-CM

## 2016-08-06 DIAGNOSIS — E785 Hyperlipidemia, unspecified: Secondary | ICD-10-CM

## 2016-08-06 DIAGNOSIS — E538 Deficiency of other specified B group vitamins: Secondary | ICD-10-CM

## 2016-08-14 ENCOUNTER — Encounter: Payer: Medicare Other | Admitting: Family Medicine

## 2016-08-16 ENCOUNTER — Other Ambulatory Visit: Payer: Self-pay | Admitting: Family Medicine

## 2016-09-05 ENCOUNTER — Other Ambulatory Visit: Payer: Self-pay | Admitting: Family Medicine

## 2016-09-11 ENCOUNTER — Telehealth: Payer: Self-pay | Admitting: Pulmonary Disease

## 2016-09-11 DIAGNOSIS — J449 Chronic obstructive pulmonary disease, unspecified: Secondary | ICD-10-CM

## 2016-09-11 NOTE — Telephone Encounter (Signed)
Spoke with pt's daughter, Lynnell Dike, who states she did some research on her mother's chronic cough. [name] states she came across pulmonary fibrosis, and filled out a surgery  pt has 3 out of 4 symptoms. [name] stated during her research she found that a CT is how the fibrosis is detected. [name] is aware that DS is out of office until Tuesday, susie ask that DS call her directly after 3 to discuss this further.     DS please advise. Thanks.

## 2016-09-11 NOTE — Telephone Encounter (Signed)
Pt daughter calling stating she has some information regard patient cough condition she did some research and thinks it may be related to Idiopathic pulmonary fibrosis  She states patient has a lot of signs of having that She would like a call back to discuss this  Please advise

## 2016-09-15 NOTE — Telephone Encounter (Signed)
Called pt and requested follow up in 3-4 weeks with CXR and PFTs prior to visit  Ariel Fischer, MD PCCM service Mobile [phone] Pager [phone] 09/15/2016 3:49 PM

## 2016-09-15 NOTE — Telephone Encounter (Signed)
Please schedule pt for a PFT and a f/u with DS in 4 weeks with CXR day of appt. Orders placed. Thanks

## 2016-09-16 NOTE — Telephone Encounter (Signed)
Called and spoke with patient's daughter.  PFT scheduled for Tues 10/06/16 at 8:30 at [facility]. Pt to arrive at 8:15. Instruction sheet mailed.  Waiting on July schedule to be placed so F/U appointment can be scheduled. Ariel Smith

## 2016-09-16 NOTE — Telephone Encounter (Signed)
Appointment scheduled with Dr. Sung Amabile on Monday 10/26/16 at 8:30 with CXR prior to this appointment with CXR on the day of this appointment. Appointment information mailed to patient. Will contact the daughter back tomorrow to advise of the appointment and CXR as daughter works 3rd shift. Ariel Smith

## 2016-09-17 NOTE — Telephone Encounter (Signed)
Appointments mailed to patient and Voice Message left for daughter of appointments and if she needed anything else to contact me at [phone].  Nothing else needed at this time. Rhonda J Cobb

## 2016-09-17 NOTE — Telephone Encounter (Signed)
Called and LMOAM for pt's daughter, of the appointment with Dr. Sung Amabile and to go the same day to the medical mall entrance at 7:45 for Chest Xray before the appointment. Advised daughter that this information has been mailed to patient along with instruction sheet for PFT that if she had any questions to please call me back at [phone]. Rhonda J Cobb

## 2016-10-04 ENCOUNTER — Other Ambulatory Visit: Payer: Self-pay | Admitting: Family Medicine

## 2016-10-06 ENCOUNTER — Ambulatory Visit: Payer: Medicare Other | Attending: Pulmonary Disease

## 2016-10-06 DIAGNOSIS — J449 Chronic obstructive pulmonary disease, unspecified: Secondary | ICD-10-CM

## 2016-10-06 MED ORDER — ALBUTEROL SULFATE (2.5 MG/3ML) 0.083% IN NEBU
2.5000 mg | INHALATION_SOLUTION | Freq: Once | RESPIRATORY_TRACT | Status: AC
  Administered 2016-10-06: 2.5 mg via RESPIRATORY_TRACT
  Filled 2016-10-06: qty 3

## 2016-10-06 NOTE — Progress Notes (Signed)
Patient was unable to do Pulmonary function testing due to excessive coughing. Patient was administered a 2.5mg  albuterol nebulizer but coughing still persisted. Dr. Sung Amabile notified. Patients vitals were stable, Sat of 97% on room air, HR of 86, RR of 18-20, and breath sounds were diminished but clear to auscultation.

## 2016-10-13 ENCOUNTER — Encounter (INDEPENDENT_AMBULATORY_CARE_PROVIDER_SITE_OTHER): Payer: Medicare Other

## 2016-10-13 ENCOUNTER — Ambulatory Visit (INDEPENDENT_AMBULATORY_CARE_PROVIDER_SITE_OTHER): Payer: Medicare Other | Admitting: Vascular Surgery

## 2016-10-23 ENCOUNTER — Other Ambulatory Visit: Payer: Self-pay | Admitting: Family Medicine

## 2016-10-23 NOTE — Telephone Encounter (Signed)
Rx called in to requested pharmacy 

## 2016-10-23 NOTE — Telephone Encounter (Signed)
Last Rx 03/2016 #30 3R. Last OV 12/2015

## 2016-10-23 NOTE — Telephone Encounter (Signed)
plz phone in. 

## 2016-10-26 ENCOUNTER — Ambulatory Visit (INDEPENDENT_AMBULATORY_CARE_PROVIDER_SITE_OTHER): Payer: Medicare Other | Admitting: Pulmonary Disease

## 2016-10-26 ENCOUNTER — Ambulatory Visit
Admission: RE | Admit: 2016-10-26 | Discharge: 2016-10-26 | Disposition: A | Discharge status: Home / Self Care | Payer: Medicare Other | Source: Ambulatory Visit | Attending: Pulmonary Disease | Admitting: Pulmonary Disease

## 2016-10-26 ENCOUNTER — Encounter: Payer: Self-pay | Admitting: Pulmonary Disease

## 2016-10-26 VITALS — BP 150/84 | HR 94 | Ht 64.0 in | Wt 225.0 lb

## 2016-10-26 DIAGNOSIS — J9611 Chronic respiratory failure with hypoxia: Secondary | ICD-10-CM | POA: Diagnosis not present

## 2016-10-26 DIAGNOSIS — Z86718 Personal history of other venous thrombosis and embolism: Secondary | ICD-10-CM | POA: Diagnosis not present

## 2016-10-26 DIAGNOSIS — R05 Cough: Secondary | ICD-10-CM

## 2016-10-26 DIAGNOSIS — J449 Chronic obstructive pulmonary disease, unspecified: Secondary | ICD-10-CM | POA: Diagnosis not present

## 2016-10-26 DIAGNOSIS — R053 Chronic cough: Secondary | ICD-10-CM

## 2016-10-26 MED ORDER — FAMOTIDINE 40 MG PO TABS: 40.0000 mg | ORAL_TABLET | Freq: Every evening | ORAL | 5 refills | Status: DC

## 2016-10-26 NOTE — Progress Notes (Signed)
PULMONARY OFFICE FOLLOW UP NOTE  Requesting MD/Service: [name] Date of initial consultation: 09/26/15 Reason for consultation: COPD, dyspnea  PT PROFILE: 71 y.o. F former smoker previously followed by Dr Meredeth Ide with oxygen dependence, "COPD" and disabling dyspnea.  PFTs 01/19/15: normal spirometry, lung volumes and DLCO invalid PFTs 11/07/15: unable to perform 11/07/15: 150 meters. Desaturation to 77% despite O2 @ 2 LPM [place] CT chest 01/25/16: Mild cardiomegaly with evidence of mild interstitial pulmonary edema; findings suggestive of mild congestive heart failure CXR 11/07/15: NACPD Hospitalization 07/03-07/07/17: for LLE DVT (recurrent). Discharged on Eliquis ROV 11/14/15: No major changes in symptoms, class IV dyspnea. Severe hypoxemia and exercise related desaturation is unexplained. Previous spirometry normal. CXR normal. Echocardiogram with bubble study ordered Echocardiogram with bubble study: LVEF 55-60%. Bioprosthetic AoV. Mild-mod MR. LA moderately dilated. RA/RV normal. No R>L shunt ROV 10/26/16: chief complaint of cough but also disabling dyspnea. Symbicort and albuterol not helpful.   SUBJ: Missed previously scheduled appt for unclear reasons. Chief complaint presently is cough but also disabling dyspnea. There are no definite mitigating or exacerbating factors identified but daughter indcates that an over-the-counter cold and cough formula helped previously. Symbicort and albuterol are not helpful. Daughter provides most history. Describes paroxysms of severe NP cough occurring at least daily. No CP or hemoptysis. Still with class IV dyspnea.   OBJ:  Vitals:   10/26/16 0810 10/26/16 0816  BP:  (!) 150/84  Pulse:  94  SpO2:  97%  Weight: 225 lb (102.1 kg)   Height: 5\' 4"  (1.626 m)   3 lpm pulse  EXAM:  Gen: NAD HEENT: WNL Neck: No JVD noted  Lungs: full, slightly coarse BS, no wheezes Cardiovascular: RRR, prominent S2 Abdomen: Obese, soft, nontender,  normal BS Ext: without clubbing, cyanosis. trace symmetric edema Neuro: grossly intact Skin: Limited exam, no lesions noted  DATA:   BMP Latest Ref Rng & Units 10/25/2015 10/24/2015 10/21/2015  Glucose 65 - 99 mg/dL 97 209(O) 98  BUN 6 - 20 mg/dL 70(J) 62(E) 36(O)  Creatinine 0.44 - 1.00 mg/dL 2.94(T) 6.54(Y) 5.03(T)  Sodium 135 - 145 mmol/L 136 138 139  Potassium 3.5 - 5.1 mmol/L 5.6(H) 5.0 4.2  Chloride 101 - 111 mmol/L 95(L) 97(L) 97(L)  CO2 22 - 32 mmol/L 30 31 30   Calcium 8.9 - 10.3 mg/dL 9.2 9.2 9.2    CBC Latest Ref Rng & Units 10/24/2015 10/21/2015 10/11/2015  WBC 3.6 - 11.0 K/uL 5.5 7.3 6.2  Hemoglobin 12.0 - 16.0 g/dL 11.0(L) 11.4(L) 11.6(L)  Hematocrit 35.0 - 47.0 % 33.1(L) 34.4(L) 35.4  Platelets 150 - 440 K/uL 148(L) 169 130(L)    CXR: NACPD   IMPRESSION:   1) Chronic hypoxic and hypercarbic respiratory failure 2) History of recurrent DVT 3) Obesity 4) profound exercise limitation/class IV dyspnea not well explained by existing data. No evidence of decompensated CHF and no evidence of PAH 5) Chronic cough of unclear etiology - statistically, most likely etiology is GERD. Might be cyclical component   PLAN:  Continue Symbicort for now Continue Protonix in morning Famotidine (Pepcid) 40 mg at bedtime Cold and cough formulation on a scheduled regimen as directed on bottle X one week, then as needed after that  Follow up in 6-8 weeks   Billy Fischer, MD PCCM service Mobile [phone] Pager [phone] 10/27/2016 3:59 PM

## 2016-10-26 NOTE — Patient Instructions (Signed)
Continue Symbicort for now Continue Protonix in morning Famotidine (Pepcid) 40 mg at bedtime Cold and Mucus formulation every day for one week, then as needed after that  Follow up in 6-8 weeks

## 2016-11-10 ENCOUNTER — Ambulatory Visit: Payer: Medicare Other | Admitting: Family

## 2016-12-04 ENCOUNTER — Other Ambulatory Visit: Payer: Self-pay | Admitting: Family Medicine

## 2016-12-07 ENCOUNTER — Other Ambulatory Visit: Payer: Self-pay | Admitting: Family Medicine

## 2016-12-11 ENCOUNTER — Other Ambulatory Visit: Payer: Self-pay | Admitting: Family Medicine

## 2016-12-13 NOTE — Progress Notes (Signed)
Cardiology Office Note  Date:  12/15/2016   ID:  Olar, Wirsing May 01, 1945, MRN [id number]  PCP:  [name], MD   Chief Complaint  Patient presents with  . other    6 month follow up. Patient c/o SOB. Meds reviewed verbally with patient.     HPI:  71 y.o. female with h/o  end-stage renal disease on hemodialysis,  severe aortic valve stenosis,  GI bleed the beginning of August 2016,  warfarin held, hypertension,  previous DVT/PE, 09/2015,  Thrombectomy,  IVC filter, previously on warfarin COPD, chronic shortness of breath, quit in 2009 (age 75 to 58),  TAVR in early November 2016  quit smoking in 2009.  She presents for follow-up of her prosthetic aortic valve  In follow-up today she presents with her daughter Chronic Short of breath with minimal activity such as taking a bath, walking Coughing fits, better recently No regular exercise program.Previously  not interested in cardiac or pulmonary rehabilitation  Currently taking Lasix as needed, not on a regular basis Reports that dialysis is going well, cramping and low blood pressure sometimes at the end of dialysis HD mon/wed/friday, Dry weight 98KG  PFTs October 2016 showing severe perfusion abnormality Chest x-ray with underlying emphysematous lung changes CT scan with scarring at the bases She wears chronic oxygen, 2 L  Echo 11/2015 showing normal LV systolic function, prosthetic valve intact  EKG on today's visit shows normal sinus rhythm with rate 80 bpm,  no significant ST or T-wave changes  Other past medical history Previous hospital admissions for hypoxia, placed on BiPAP, improved with dialysis   PMH:   has a past medical history of Acute DVT (deep venous thrombosis) (HCC) (2017); Anemia of chronic disease; Bronchiectasis (HCC) (08/2014 ); Chronic diastolic CHF (congestive heart failure) (HCC); Chronic respiratory failure (HCC); COPD (chronic obstructive pulmonary disease) (HCC); Depression; ESRD  (end stage renal disease) (HCC) (08/2011); Frequent headaches; GERD (gastroesophageal reflux disease); GIB (gastrointestinal bleeding); Heart murmur; History of colon polyps (2013); History of DVT of lower extremity (2009, 2017); HLD (hyperlipidemia); HTN (hypertension); Morbid obesity (HCC); Osteoarthritis; Osteopenia (01/2013); Pulmonary embolism (HCC) (2009); Renal failure; 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  PSH:    Past Surgical History:  Procedure Laterality Date  . A/V FISTULAGRAM Left 07/09/2016   Procedure: A/V Fistulagram;  Surgeon: [name], MD;  Location: [facility] INVASIVE CV LAB;  Service: Cardiovascular;  Laterality: Left;  . BUNIONECTOMY Left 2003  . CARDIAC CATHETERIZATION N/A 01/18/2015   patent coronary arteries without significant osbtruction and preserved LV function, severe aortic stenosis; Procedure: Right/Left Heart Cath and Coronary Angiography;  Surgeon: Tonny Bollman, MD  . CHOLECYSTECTOMY  2012  . COLONOSCOPY  08/2011   colon biopsies, [name]  . ESOPHAGOGASTRODUODENOSCOPY  08/2011   gastric cardia polyp  . ESOPHAGOGASTRODUODENOSCOPY Left 11/11/2014   Procedure: ESOPHAGOGASTRODUODENOSCOPY (EGD);  Surgeon: [name], MD;  Location: [facility] ENDOSCOPY;  Service: Endoscopy;  Laterality: Left;  . EXTERIORIZATION OF A CONTINUOUS AMBULATORY PERITONEAL DIALYSIS CATHETER  01/2013   removal 12/2103 - [name]  . hospitalization  12/2013   recurrent R pleural effusion due to peritoneal fluid translocation s/p rpt thoracentesis with 1.3 L fluid removed, ERSD started on HD this hospitalization  . PERIPHERAL VASCULAR CATHETERIZATION N/A 11/12/2014   Procedure: IVC Filter Insertion;  Surgeon: [name], MD;  Location: [facility] INVASIVE CV LAB;  Service: Cardiovascular;  Laterality: N/A;  . PERIPHERAL VASCULAR CATHETERIZATION  Left 10/24/2015   Procedure: Lower Extremity Venography with  intervention (thormbolysis/thrombectomy);  Surgeon: [name], MD;  Location: [facility] INVASIVE CV LAB;  Service: Cardiovascular;  Laterality: Left;  . REPLACEMENT TOTAL KNEE Left 2006  . SHOULDER ARTHROSCOPY Right 2009  . TEE WITHOUT CARDIOVERSION N/A 02/19/2015   Procedure: TRANSESOPHAGEAL ECHOCARDIOGRAM (TEE);  Surgeon: Tonny Bollman, MD;  Location: [facility] OR;  Service: Open Heart Surgery;  Laterality: N/A;  . TONSILLECTOMY  1955  . TRANSCATHETER AORTIC VALVE REPLACEMENT, TRANSFEMORAL Right 02/19/2015   Procedure: TRANSCATHETER AORTIC VALVE REPLACEMENT, TRANSFEMORAL;  Surgeon: Tonny Bollman, MD;  Location: [facility] OR;  Service: Open Heart Surgery;  Laterality: Right;  . TUBAL LIGATION  1980  . US ECHOCARDIOGRAPHY  12/2013   EF 55-60%, nl LV sys fxn, mild-mod MR, AS, increased LV posterior wall thickness, mild TR    Current Outpatient Prescriptions  Medication Sig Dispense Refill  . acetaminophen (TYLENOL) 500 MG tablet Take 1,000 mg by mouth every 6 (six) hours as needed for mild pain or headache.     . albuterol (VENTOLIN HFA) 108 (90 BASE) MCG/ACT inhaler Inhale 2 puffs into the lungs every 4 (four) hours as needed for wheezing or shortness of breath. 18 g 3  . ALPRAZolam (XANAX) 1 MG tablet TAKE ONE TABLET BY MOUTH AT BEDTIME 30 tablet 3  . atorvastatin (LIPITOR) 40 MG tablet TAKE 1 TABLET BY MOUTH ONCE DAILY AT  6PM. NEEDS OFFICE VISIT. 90 tablet 0  . budesonide-formoterol (SYMBICORT) 160-4.5 MCG/ACT inhaler Inhale 2 puffs into the lungs 2 (two) times daily.    . calcitRIOL (ROCALTROL) 0.25 MCG capsule Take 0.25 mcg by mouth daily.    . calcium acetate (PHOSLO) 667 MG capsule Take 667-2,001 mg by mouth 3 (three) times daily with meals. Take 3 with meals and 1 with a snack    . cinacalcet (SENSIPAR) 30 MG tablet Take 30 mg by mouth daily.    . clopidogrel (PLAVIX) 75 MG tablet TAKE ONE TABLET BY MOUTH ONCE DAILY WITH BREAKFAST 90 tablet 2  . diphenhydrAMINE (BENADRYL) 25 MG tablet Take 25 mg by  mouth every 6 (six) hours as needed.    . donepezil (ARICEPT) 5 MG tablet Take 1 tablet (5 mg total) by mouth at bedtime. 90 tablet 3  . ELIQUIS 2.5 MG TABS tablet TAKE ONE TABLET BY MOUTH TWICE DAILY 60 tablet 6  . famotidine (PEPCID) 40 MG tablet Take 1 tablet (40 mg total) by mouth every evening. 30 tablet 5  . lidocaine-prilocaine (EMLA) cream Apply 1 application topically as needed (topical anesthesia for hemodialysis if Gebauers and Lidocaine injection are ineffective.). 30 g 0  . Multiple Vitamin (MULTIVITAMIN WITH MINERALS) TABS tablet Take 1 tablet by mouth daily.    . pantoprazole (PROTONIX) 40 MG tablet TAKE ONE TABLET BY MOUTH ONCE DAILY AS NEEDED 90 tablet 1  . traZODone (DESYREL) 50 MG tablet TAKE 1 & 1/2 (ONE & ONE-HALF) TABLETS BY MOUTH AT BEDTIME 135 tablet 1   No current facility-administered medications for this visit.      Allergies:   Nodolor [isometheptene-dichloral-apap]   Social History:  The patient  reports that she quit smoking about 9 years ago. Her smoking use included Cigarettes. She started smoking about 38 years ago. She has a 60.00 pack-year smoking history. She has never used smokeless tobacco. She reports that she does not drink alcohol or use drugs.   Family History:   family history includes Breast cancer (age of onset: 36) in her daughter; Cancer  in her father; Cancer (age of onset: 36) in her mother; Deep vein thrombosis in her brother; Dementia (age of onset: 58) in her father; Hypertension in her brother and father; Stroke in her mother.    Review of Systems: Review of Systems  Constitutional: Negative.   Respiratory: Positive for shortness of breath.   Cardiovascular: Negative.   Gastrointestinal: Negative.   Musculoskeletal: Negative.   Neurological: Negative.   Psychiatric/Behavioral: Negative.   All other systems reviewed and are negative.    PHYSICAL EXAM: VS:  BP 108/66 (BP Location: Right Arm, Patient Position: Sitting, Cuff Size:  Normal)   Pulse 80   Ht 5\' 5"  (1.651 m)   Wt 224 lb 4 oz (101.7 kg)   BMI 37.32 kg/m  , BMI Body mass index is 37.32 kg/m. GEN: Well nourished, well developed, in no acute distress, obese on nasal cannula oxygen,  HEENT: normal  Neck: no JVD, carotid bruits, or masses Cardiac: RRR; no murmurs, rubs, or gallops,no edema  Respiratory:  clear to auscultation bilaterally, normal work of breathing GI: soft, nontender, nondistended, + BS MS: no deformity or atrophy  Skin: warm and dry, no rash Neuro:  Strength and sensation are intact Psych: euthymic mood, full affect    Recent Labs: No results found for requested labs within last 8760 hours.    Lipid Panel Lab Results  Component Value Date   CHOL 149 07/10/2015   HDL 34.30 (L) 07/10/2015   LDLCALC 91 07/10/2015   TRIG 116.0 07/10/2015      Wt Readings from Last 3 Encounters:  12/15/16 224 lb 4 oz (101.7 kg)  10/26/16 225 lb (102.1 kg)  07/09/16 220 lb (99.8 kg)       ASSESSMENT AND PLAN:  Chronic diastolic CHF (congestive heart failure) (HCC) - Plan: EKG 12-Lead Relatively euvolemic on Lasix 20 mg daily and hemodialysis Comfortable with her current dry weight, has hemodialysis 3 days per week  Essential hypertension Blood pressure is well controlled on today's visit. No changes made to the medications.  HLD (hyperlipidemia) Cholesterol is at goal on the current lipid regimen. No changes to the medications were made.  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  Chronic diastolic CHF, improved with dialysis   Total  encounter time more than 25 minutes  Greater than 50% was spent in counseling and coordination of care with the patient   Disposition:   F/U  6 months   Orders Placed This Encounter  Procedures  . EKG 12-Lead     Signed, Dossie Arbour, M.D., Ph.D. 12/15/2016  [facility] [place], [place] [phone]

## 2016-12-15 ENCOUNTER — Ambulatory Visit (INDEPENDENT_AMBULATORY_CARE_PROVIDER_SITE_OTHER): Payer: Medicare Other | Admitting: Cardiovascular Disease

## 2016-12-15 ENCOUNTER — Encounter: Payer: Self-pay | Admitting: Cardiovascular Disease

## 2016-12-15 VITALS — BP 108/66 | HR 80 | Ht 65.0 in | Wt 224.2 lb

## 2016-12-15 DIAGNOSIS — Z952 Presence of prosthetic heart valve: Secondary | ICD-10-CM | POA: Diagnosis not present

## 2016-12-15 DIAGNOSIS — I350 Nonrheumatic aortic (valve) stenosis: Secondary | ICD-10-CM | POA: Diagnosis not present

## 2016-12-15 DIAGNOSIS — I82509 Chronic embolism and thrombosis of unspecified deep veins of unspecified lower extremity: Secondary | ICD-10-CM

## 2016-12-15 DIAGNOSIS — I10 Essential (primary) hypertension: Secondary | ICD-10-CM

## 2016-12-15 DIAGNOSIS — I5032 Chronic diastolic (congestive) heart failure: Secondary | ICD-10-CM | POA: Diagnosis not present

## 2016-12-15 DIAGNOSIS — E782 Mixed hyperlipidemia: Secondary | ICD-10-CM

## 2016-12-15 DIAGNOSIS — J449 Chronic obstructive pulmonary disease, unspecified: Secondary | ICD-10-CM

## 2016-12-15 DIAGNOSIS — N186 End stage renal disease: Secondary | ICD-10-CM

## 2016-12-15 NOTE — Patient Instructions (Signed)

## 2016-12-22 ENCOUNTER — Ambulatory Visit: Payer: Medicare Other | Admitting: Pulmonary Disease

## 2017-02-22 ENCOUNTER — Telehealth: Payer: Self-pay | Admitting: Cardiovascular Disease

## 2017-02-22 NOTE — Telephone Encounter (Signed)
Left voicemail message to call back  

## 2017-02-22 NOTE — Telephone Encounter (Signed)
Spoke with patients daughter and she states that patient is having difficulty breathing after dialysis. She states that her weights are within normal range with no weight gain and blood pressures have been normal. She states that her lung doctor says her lungs are fine and during dialysis BP's have been fine. Advised her to check with her dialysis physician and then possibly her PCP. We had a very lengthy discussion regarding her symptoms and previous assessments and testing. Advised her to continue monitoring and to alert her PCP if symptoms persist. She verbalized understanding of our conversation, agreement with plan, and had no further questions at this time.

## 2017-02-22 NOTE — Telephone Encounter (Signed)
Pt c/o Shortness Of Breath: STAT if SOB developed within the last 24 hours or pt is noticeably SOB on the phone  1. Are you currently SOB (can you hear that pt is SOB on the phone)?   2. How long have you been experiencing SOB? Since this past Friday  3. Are you SOB when sitting or when up moving around? Both. States this started on Friday, and hasnt gotten any better  4. Are you currently experiencing any other symptoms? no

## 2017-02-26 ENCOUNTER — Ambulatory Visit: Payer: Self-pay

## 2017-02-26 NOTE — Telephone Encounter (Signed)
   Reason for Disposition . [1] Loss of speech or garbled speech AND [2] new onset  Answer Assessment - Initial Assessment Questions 1. LEVEL OF CONSCIOUSNESS: "How is he (she, the patient) acting right now?" (e.g., alert-oriented, confused, lethargic, stuporous, comatose)     Alert;hard time waking her up this morning 2. ONSET: "When did the confusion start?"  (minutes, hours, days)     Last night 3. PATTERN "Does this come and go, or has it been constant since it started?"  "Is it present now?"     Present 4. ALCOHOL or DRUGS: "Has he been drinking alcohol or taking any drugs?"      No 5. NARCOTIC MEDICATIONS: "Has he been receiving any narcotic medications?" (e.g., morphine, Vicodin)     No 6. CAUSE: "What do you think is causing the confusion?"      Maybe dementia 7. OTHER SYMPTOMS: "Are there any other symptoms?" (e.g., difficulty breathing, headache, fever, weakness)     Weakness, difficulty moving," no memory"  Protocols used: CONFUSION - DELIRIUM-A-AH  States sudden onset of weakness, confusion, difficult speech. Tolerated dialysis this morning. Denies any paralysis .

## 2017-02-26 NOTE — Telephone Encounter (Signed)
Agree with ER dispo. Thanks.  

## 2017-02-26 NOTE — Telephone Encounter (Signed)
Ledon Snare RN also noted: Advised family to call 911 for sudden onset of difficult speech,weakness and confusion. Daughter,Susan, request to talk to Dr. Sharen Hones by phone within the next few days. Thanks. (Routing comment

## 2017-02-26 NOTE — Telephone Encounter (Signed)
Noted. Agree with ER dispo as well.

## 2017-02-26 NOTE — Telephone Encounter (Signed)
I spoke Darl Pikes (DPR signed) pt still has not agreed to go to ED; still under negotiation with pts daughter. Did advise that Dr Para March thought pt should be eval at ED and Darl Pikes voiced understanding.FYI to Dr Para March.

## 2017-02-26 NOTE — Telephone Encounter (Signed)
I spoke with pts daughter Darl Pikes and then with the pt; pt does not want to be stuck 1000 times and does not want to stay in hospital for wks. I advised pt with her symptoms and she is having problems walking this morning that she needs to go to ED for eval. Pt said she would talk with daughter and decide what to do. FYI to Dr Para March.

## 2017-02-27 ENCOUNTER — Encounter: Payer: Self-pay | Admitting: Emergency Medicine

## 2017-02-27 ENCOUNTER — Emergency Department: Payer: Medicare Other

## 2017-02-27 ENCOUNTER — Other Ambulatory Visit: Payer: Self-pay

## 2017-02-27 ENCOUNTER — Emergency Department
Admission: EM | Admit: 2017-02-27 | Discharge: 2017-02-27 | Disposition: A | Discharge status: Home / Self Care | Payer: Medicare Other | Attending: Emergency Medicine | Admitting: Emergency Medicine

## 2017-02-27 DIAGNOSIS — J449 Chronic obstructive pulmonary disease, unspecified: Secondary | ICD-10-CM | POA: Insufficient documentation

## 2017-02-27 DIAGNOSIS — R531 Weakness: Secondary | ICD-10-CM | POA: Insufficient documentation

## 2017-02-27 DIAGNOSIS — I132 Hypertensive heart and chronic kidney disease with heart failure and with stage 5 chronic kidney disease, or end stage renal disease: Secondary | ICD-10-CM | POA: Insufficient documentation

## 2017-02-27 DIAGNOSIS — N186 End stage renal disease: Secondary | ICD-10-CM | POA: Diagnosis not present

## 2017-02-27 DIAGNOSIS — I5032 Chronic diastolic (congestive) heart failure: Secondary | ICD-10-CM | POA: Diagnosis not present

## 2017-02-27 DIAGNOSIS — Z96652 Presence of left artificial knee joint: Secondary | ICD-10-CM | POA: Insufficient documentation

## 2017-02-27 DIAGNOSIS — Z79899 Other long term (current) drug therapy: Secondary | ICD-10-CM | POA: Insufficient documentation

## 2017-02-27 DIAGNOSIS — Z7901 Long term (current) use of anticoagulants: Secondary | ICD-10-CM | POA: Diagnosis not present

## 2017-02-27 DIAGNOSIS — Z87891 Personal history of nicotine dependence: Secondary | ICD-10-CM | POA: Insufficient documentation

## 2017-02-27 DIAGNOSIS — Z992 Dependence on renal dialysis: Secondary | ICD-10-CM | POA: Insufficient documentation

## 2017-02-27 DIAGNOSIS — N390 Urinary tract infection, site not specified: Secondary | ICD-10-CM

## 2017-02-27 DIAGNOSIS — Z7902 Long term (current) use of antithrombotics/antiplatelets: Secondary | ICD-10-CM | POA: Diagnosis not present

## 2017-02-27 DIAGNOSIS — R4182 Altered mental status, unspecified: Secondary | ICD-10-CM | POA: Diagnosis present

## 2017-02-27 LAB — DIFFERENTIAL
BASOS ABS: 0.1 10*3/uL (ref 0–0.1)
Basophils Relative: 1 %
EOS ABS: 0.1 10*3/uL (ref 0–0.7)
Eosinophils Relative: 2 %
LYMPHS ABS: 0.9 10*3/uL — AB (ref 1.0–3.6)
LYMPHS PCT: 15 %
MONOS PCT: 9 %
Monocytes Absolute: 0.5 10*3/uL (ref 0.2–0.9)
NEUTROS PCT: 73 %
Neutro Abs: 4.6 10*3/uL (ref 1.4–6.5)

## 2017-02-27 LAB — COMPREHENSIVE METABOLIC PANEL
ALBUMIN: 3.6 g/dL (ref 3.5–5.0)
ALK PHOS: 116 U/L (ref 38–126)
ALT: 10 U/L — AB (ref 14–54)
AST: 16 U/L (ref 15–41)
Anion gap: 12 (ref 5–15)
BILIRUBIN TOTAL: 0.5 mg/dL (ref 0.3–1.2)
BUN: 37 mg/dL — AB (ref 6–20)
CALCIUM: 8.9 mg/dL (ref 8.9–10.3)
CO2: 28 mmol/L (ref 22–32)
CREATININE: 7.36 mg/dL — AB (ref 0.44–1.00)
Chloride: 101 mmol/L (ref 101–111)
GFR calc Af Amer: 6 mL/min — ABNORMAL LOW (ref >60)
GFR, EST NON AFRICAN AMERICAN: 5 mL/min — AB (ref >60)
GLUCOSE: 107 mg/dL — AB (ref 65–99)
POTASSIUM: 3.4 mmol/L — AB (ref 3.5–5.1)
Sodium: 141 mmol/L (ref 135–145)
TOTAL PROTEIN: 7.3 g/dL (ref 6.5–8.1)

## 2017-02-27 LAB — URINALYSIS, COMPLETE (UACMP) WITH MICROSCOPIC
BILIRUBIN URINE: NEGATIVE
Glucose, UA: 50 mg/dL — AB
KETONES UR: NEGATIVE mg/dL
Nitrite: NEGATIVE
Protein, ur: 100 mg/dL — AB
SPECIFIC GRAVITY, URINE: 1.013 (ref 1.005–1.030)
pH: 7 (ref 5.0–8.0)

## 2017-02-27 LAB — APTT: APTT: 34 s (ref 24–36)

## 2017-02-27 LAB — PROTIME-INR
INR: 1.15
Prothrombin Time: 14.6 seconds (ref 11.4–15.2)

## 2017-02-27 LAB — CBC
HEMATOCRIT: 31.6 % — AB (ref 35.0–47.0)
HEMOGLOBIN: 10.4 g/dL — AB (ref 12.0–16.0)
MCH: 34.1 pg — ABNORMAL HIGH (ref 26.0–34.0)
MCHC: 33 g/dL (ref 32.0–36.0)
MCV: 103.2 fL — ABNORMAL HIGH (ref 80.0–100.0)
Platelets: 199 10*3/uL (ref 150–440)
RBC: 3.06 MIL/uL — AB (ref 3.80–5.20)
RDW: 13.8 % (ref 11.5–14.5)
WBC: 6.2 10*3/uL (ref 3.6–11.0)

## 2017-02-27 LAB — TROPONIN I

## 2017-02-27 MED ORDER — CEFUROXIME AXETIL 500 MG PO TABS: 500.0000 mg | ORAL_TABLET | Freq: Two times a day (BID) | ORAL | 0 refills | Status: DC

## 2017-02-27 MED ORDER — CEFUROXIME AXETIL 500 MG PO TABS
500.0000 mg | ORAL_TABLET | Freq: Two times a day (BID) | ORAL | Status: DC
Start: 2017-02-27 — End: 2017-02-27
  Administered 2017-02-27: 500 mg via ORAL
  Filled 2017-02-27: qty 1

## 2017-02-27 NOTE — ED Provider Notes (Signed)
[facility] Emergency Department Provider Note  ____________________________________________   First MD Initiated Contact with Patient 02/27/17 1011     (approximate)  I have reviewed the triage vital signs and the nursing notes.   HISTORY  Chief Complaint Altered Mental Status   HPI Ariel Smith is a 71 y.o. female with a history of COPD, end-stage renal disease on dialysis was today with an altered mental status.  Her family states that she has been very confused starting this Wednesday evening.  However, the confusion became more dramatic this morning when she called her daughter at work via face time which the daughter says the patient has no idea how to use and has never used before.  She also repeatedly asked the daughter what day it was.  Patient at this time denies any pain.  She denies any focal weakness.  She had a short dialysis session yesterday.  No history of stroke.  Patient lives at home with her family and is not driving anymore.    Past Medical History:  Diagnosis Date  . Acute DVT (deep venous thrombosis) (HCC) 2017   hospitalization  . Anemia of chronic disease   . Bronchiectasis (HCC) 08/2014    suggested by thoracic xray  . Chronic diastolic CHF (congestive heart failure) (HCC)    a. echo 10/2014: EF 60-65%, no RWMA, GR1DD, severe AS, mild MR, PASP 46 mm Hg  . Chronic respiratory failure (HCC)    a. 2/2 COPD; b. on 4-5L via nasal cannula  . COPD (chronic obstructive pulmonary disease) (HCC)   . Depression   . ESRD (end stage renal disease) (HCC) 08/2011   a. on HD (TThSa), L forearm AV fistula, [name]  . Frequent headaches   . GERD (gastroesophageal reflux disease)   . GIB (gastrointestinal bleeding)    a. leading to cessation of warfarin 11/2014  . Heart murmur   . History of colon polyps 2013   colonoscopy ([name])  . History of DVT of lower extremity 2009, 2017   left sided x2, with PE s/p IVC filter placement,  coumadin stopped 2/2 GI bleed  . HLD (hyperlipidemia)   . HTN (hypertension)   . Morbid obesity (HCC)   . Osteoarthritis   . Osteopenia 01/2013  . Pulmonary embolism (HCC) 2009  . Renal failure    Patient has been on dialysis for " four years" -per patient.  . 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 Secondary hyperparathyroidism of renal origin (HCC)   . Severe aortic stenosis    a. echo 10/2014: EF 60-65%, no RWMA, GR1DD, mod to sev AS (peak vel 377 cm/s, mean gradient 34 mm Hg, peak gradient 57 mm Hg, valve area (VTA) 0.72 cm^2    Patient Active Problem List   Diagnosis Date Noted  . Mass of chest wall, right 04/09/2016  . 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 Insomnia 04/08/2015  . 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 Chronic respiratory failure (HCC)   . Anemia of chronic disease   . Chronic diastolic CHF (congestive heart failure) (HCC)   . Obesity, Class II, BMI 35-39.9, with comorbidity 01/07/2015  . COPD (chronic obstructive pulmonary disease) (HCC) 12/20/2014  . DVT (deep venous thrombosis) (HCC) 11/16/2014  . Aortic valvar stenosis 11/16/2014  . Vitamin D deficiency 11/03/2014  .  Memory deficit 11/01/2014  . Right-sided thoracic back pain 08/17/2014  . Advanced care planning/counseling discussion 03/30/2014  . Health maintenance examination 03/30/2014  . Vitamin B12 deficiency 01/27/2014  . Medicare annual wellness visit, subsequent 01/27/2013  . Osteopenia 01/18/2013  . Chronic cough 10/22/2012  . Osteoarthritis   . MDD (major depressive disorder), recurrent episode, moderate (HCC)   . HTN (hypertension)   . HLD (hyperlipidemia)   . GERD (gastroesophageal reflux disease)   . End stage renal disease (HCC) 08/19/2011    Past Surgical History:  Procedure Laterality Date    . BUNIONECTOMY Left 2003  . CHOLECYSTECTOMY  2012  . COLONOSCOPY  08/2011   colon biopsies, Dr. Birdie SonsByrnette  . ESOPHAGOGASTRODUODENOSCOPY  08/2011   gastric cardia polyp  . EXTERIORIZATION OF A CONTINUOUS AMBULATORY PERITONEAL DIALYSIS CATHETER  01/2013   removal 12/2103 - Dr. Wyn Quakerew  . hospitalization  12/2013   recurrent R pleural effusion due to peritoneal fluid translocation s/p rpt thoracentesis with 1.3 L fluid removed, ERSD started on HD this hospitalization  . REPLACEMENT TOTAL KNEE Left 2006  . SHOULDER ARTHROSCOPY Right 2009  . TONSILLECTOMY  1955  . TUBAL LIGATION  1980  . US ECHOCARDIOGRAPHY  12/2013   EF 55-60%, nl LV sys fxn, mild-mod MR, AS, increased LV posterior wall thickness, mild TR    Prior to Admission medications   Medication Sig Start Date End Date Taking? Authorizing Provider  acetaminophen (TYLENOL) 500 MG tablet Take 1,000 mg by mouth every 6 (six) hours as needed for mild pain or headache.     [provider]  albuterol (VENTOLIN HFA) 108 (90 BASE) MCG/ACT inhaler Inhale 2 puffs into the lungs every 4 (four) hours as needed for wheezing or shortness of breath. 04/08/15   Eustaquio BoydenGutierrez, Javier, MD  ALPRAZolam Prudy Feeler(XANAX) 1 MG tablet TAKE ONE TABLET BY MOUTH AT BEDTIME 10/23/16   Eustaquio BoydenGutierrez, Javier, MD  atorvastatin (LIPITOR) 40 MG tablet TAKE 1 TABLET BY MOUTH ONCE DAILY AT  6PM. NEEDS OFFICE VISIT. 12/07/16   Eustaquio BoydenGutierrez, Javier, MD  budesonide-formoterol [facility](SYMBICORT) 160-4.5 MCG/ACT inhaler Inhale 2 puffs into the lungs 2 (two) times daily.    [provider]  calcitRIOL (ROCALTROL) 0.25 MCG capsule Take 0.25 mcg by mouth daily.    [provider]  calcium acetate (PHOSLO) 667 MG capsule Take 667-2,001 mg by mouth 3 (three) times daily with meals. Take 3 with meals and 1 with a snack    [provider]  cinacalcet (SENSIPAR) 30 MG tablet Take 30 mg by mouth daily.    [provider]  clopidogrel (PLAVIX) 75 MG tablet TAKE ONE TABLET BY  MOUTH ONCE DAILY WITH BREAKFAST 08/06/16   Antonieta IbaGollan, Timothy J, MD  diphenhydrAMINE (BENADRYL) 25 MG tablet Take 25 mg by mouth every 6 (six) hours as needed.    [provider]  donepezil (ARICEPT) 5 MG tablet Take 1 tablet (5 mg total) by mouth at bedtime. 04/09/16   Eustaquio BoydenGutierrez, Javier, MD  ELIQUIS 2.5 MG TABS tablet TAKE ONE TABLET BY MOUTH TWICE DAILY 10/05/16   Eustaquio BoydenGutierrez, Javier, MD  famotidine (PEPCID) 40 MG tablet Take 1 tablet (40 mg total) by mouth every evening. 10/26/16 10/26/17  Merwyn KatosSimonds, Deangela Randleman B, MD  lidocaine-prilocaine (EMLA) cream Apply 1 application topically as needed (topical anesthesia for hemodialysis if Gebauers and Lidocaine injection are ineffective.). 11/22/14   Auburn BilberryPatel, Shreyang, MD  Multiple Vitamin (MULTIVITAMIN WITH MINERALS) TABS tablet Take 1 tablet by mouth daily.    [provider]  pantoprazole (  PROTONIX) 40 MG tablet TAKE ONE TABLET BY MOUTH ONCE DAILY AS NEEDED 08/17/16   [name], MD  traZODone (DESYREL) 50 MG tablet TAKE 1 & 1/2 (ONE & ONE-HALF) TABLETS BY MOUTH AT BEDTIME 12/07/16   [name], MD    Allergies Nodolor [isometheptene-dichloral-apap]  Family History  Problem Relation Age of Onset  . Deep vein thrombosis Brother   . Cancer Mother 43       colon  . Stroke Mother   . Cancer Father        prostate  . Hypertension Father   . Dementia Father 75  . Hypertension Brother   . Breast cancer Daughter 44  . Diabetes Neg Hx   . CAD Neg Hx     Social History Social History   Tobacco Use  . Smoking status: Former Smoker    Packs/day: 2.00    Years: 30.00    Pack years: 60.00    Types: Cigarettes    Start date: 04/20/1978    Last attempt to quit: 04/21/2007    Years since quitting: 9.8  . Smokeless tobacco: Never Used  Substance Use Topics  . Alcohol use: No  . Drug use: No    Review of Systems  Constitutional: No fever/chills Eyes: No visual changes. ENT: No sore throat. Cardiovascular: Denies chest  pain. Respiratory: Denies shortness of breath. Gastrointestinal: No abdominal pain.  No nausea, no vomiting.  No diarrhea.  No constipation. Genitourinary: Negative for dysuria. Musculoskeletal: Negative for back pain. Skin: Negative for rash. Neurological: Negative for headaches, focal weakness or numbness.   ____________________________________________   PHYSICAL EXAM:  VITAL SIGNS: ED Triage Vitals  Enc Vitals Group     BP 02/27/17 1004 (!) 148/67     Pulse Rate 02/27/17 1004 76     Resp 02/27/17 1004 20     Temp 02/27/17 1004 98.5 F (36.9 C)     Temp Source 02/27/17 1004 Oral     SpO2 02/27/17 1004 98 %     Weight 02/27/17 1003 280 lb (127 kg)     Height 02/27/17 1003 5\' 5"  (1.651 m)     Head Circumference --      Peak Flow --      Pain Score --      Pain Loc --      Pain Edu? --      Excl. in GC? --     Constitutional: Alert and oriented. Well appearing and in no acute distress. Eyes: Conjunctivae are normal.  Head: Atraumatic. Nose: No congestion/rhinnorhea. Mouth/Throat: Mucous membranes are moist.  Neck: No stridor.   Cardiovascular: Normal rate, regular rhythm. Grossly normal heart sounds.  Good peripheral circulation.  With palpable thrill to the forearm fistula on the left. Respiratory: Normal respiratory effort.  No retractions. Lungs CTAB. Gastrointestinal: Soft and nontender. No distention. No CVA tenderness. Musculoskeletal: No lower extremity tenderness nor edema.  No joint effusions. Neurologic:  Normal speech and language. No gross focal neurologic deficits are appreciated. Skin:  Skin is warm, dry and intact. No rash noted. Psychiatric: Mood and affect are normal. Speech and behavior are normal.  NIH Stroke Scale  Person Administering Scale: Arelia Longest  Administer stroke scale items in the order listed. Record performance in each category after each subscale exam. Do not go back and change scores. Follow directions provided for each  exam technique. Scores should reflect what the patient does, not what the clinician thinks the patient can do. The clinician should  record answers while administering the exam and work quickly. Except where indicated, the patient should not be coached (i.e., repeated requests to patient to make a special effort).   1a  Level of consciousness: 0=alert; keenly responsive  1b. LOC questions:  0=Performs both tasks correctly  1c. LOC commands: 0=Performs both tasks correctly  2.  Best Gaze: 0=normal  3.  Visual: 0=No visual loss  4. Facial Palsy: 0=Normal symmetric movement  5a.  Motor left arm: 0=No drift, limb holds 90 (or 45) degrees for full 10 seconds  5b.  Motor right arm: 0=No drift, limb holds 90 (or 45) degrees for full 10 seconds  6a. motor left leg: 0=No drift, limb holds 90 (or 45) degrees for full 10 seconds  6b  Motor right leg:  0=No drift, limb holds 90 (or 45) degrees for full 10 seconds  7. Limb Ataxia: 0=Absent  8.  Sensory: 0=Normal; no sensory loss  9. Best Language:  0=No aphasia, normal  10. Dysarthria: 0=Normal  11. Extinction and Inattention: 0=No abnormality  12. Distal motor function: 0=Normal   Total:   0   ____________________________________________   LABS (all labs ordered are listed, but only abnormal results are displayed)  Labs Reviewed  CBC - Abnormal; Notable for the following components:      Result Value   RBC 3.06 (*)    Hemoglobin 10.4 (*)    HCT 31.6 (*)    MCV 103.2 (*)    MCH 34.1 (*)    All other components within normal limits  DIFFERENTIAL - Abnormal; Notable for the following components:   Lymphs Abs 0.9 (*)    All other components within normal limits  COMPREHENSIVE METABOLIC PANEL - Abnormal; Notable for the following components:   Potassium 3.4 (*)    Glucose, Bld 107 (*)    BUN 37 (*)    Creatinine, Ser 7.36 (*)    ALT 10 (*)    GFR calc non Af Amer 5 (*)    GFR calc Af Amer 6 (*)    All other components within normal limits   URINALYSIS, COMPLETE (UACMP) WITH MICROSCOPIC - Abnormal; Notable for the following components:   Color, Urine YELLOW (*)    APPearance HAZY (*)    Glucose, UA 50 (*)    Hgb urine dipstick SMALL (*)    Protein, ur 100 (*)    Leukocytes, UA MODERATE (*)    Bacteria, UA RARE (*)    Squamous Epithelial / LPF 0-5 (*)    Non Squamous Epithelial 0-5 (*)    All other components within normal limits  TROPONIN I  PROTIME-INR  APTT  CBG MONITORING, ED   ____________________________________________  EKG  ED ECG REPORT I, Arelia Longest, the attending physician, personally viewed and interpreted this ECG.   Date: 02/27/2017  EKG Time: 1002  Rate: 67  Rhythm: normal sinus rhythm  Axis: Normal  Intervals:none  ST&T Change: No ST segment elevation or depression.  No abnormal T wave inversion.  ____________________________________________  RADIOLOGY  No acute intracranial pathology on the CT scan. ____________________________________________   PROCEDURES  Procedure(s) performed:   Procedures  Critical Care performed:   ____________________________________________   INITIAL IMPRESSION / ASSESSMENT AND PLAN / ED COURSE  Pertinent labs & imaging results that were available during my care of the patient were reviewed by me and considered in my medical decision making (see chart for details).  Differential diagnosis includes, but is not limited to, alcohol, illicit or  prescription medications, or other toxic ingestion; intracranial pathology such as stroke or intracerebral hemorrhage; fever or infectious causes including sepsis; hypoxemia and/or hypercarbia; uremia; trauma; endocrine related disorders such as diabetes, hypoglycemia, and thyroid-related diseases; hypertensive encephalopathy; etc.  As part of my medical decision making, I reviewed the following data within the electronic MEDICAL RECORD NUMBER Notes from prior ED  visits  ----------------------------------------- 2:21 PM on 02/27/2017 -----------------------------------------  Patient with very reassuring blood work.  However, her urinalysis is consistent with UTI.  Her symptoms of confusion and weakness are also consistent with UTI.  Patient will be discharged with Ceftin.  Patient to be discharged with family.  Patient and family understand the diagnosis and treatment plan.  Patient and family know to return for any worsening concerning symptoms, especially worsened confusion or fever.      ____________________________________________   FINAL CLINICAL IMPRESSION(S) / ED DIAGNOSES  UTI.  Weakness.    NEW MEDICATIONS STARTED DURING THIS VISIT:  This SmartLink is deprecated. Use AVSMEDLIST instead to display the medication list for a patient.   Note:  This document was prepared using Dragon voice recognition software and may include unintentional dictation errors.     Myrna Blazer, MD 02/27/17 [phone]

## 2017-02-27 NOTE — ED Triage Notes (Signed)
Patient to ER for c/o AMS since Wednesday evening. Patient went to dialysis as scheduled on Wednesday, was fine that morning and during dialysis. Took a nap after dialysis and woke up at approx 1700-1730 that evening. Daughter states since that time, patient has had AMS, difficulty walking, and intermittent slurred speech. Patient arrives on 3L O2, which is her typical. Patient did go to dialysis yesterday, but only received partial treatment d/t not feeling well and went home. Patient seems confused in triage (had difficulty trying to give information to registration), but able to answer questions appropriately.

## 2017-02-27 NOTE — ED Notes (Signed)
Attempted IV access x 1, unable to obtain access, was able to get blood from IV stick.

## 2017-03-01 LAB — URINE CULTURE

## 2017-03-03 ENCOUNTER — Telehealth: Payer: Self-pay | Admitting: Family Medicine

## 2017-03-03 NOTE — Telephone Encounter (Signed)
Pt's daughter called stating "something is not right" with her mother. Pt has been recently diagnosed with a UTI and has been prescribed an antibiotic which she has been taking twice a day since Saturday. Pt's daughter states when she talked to her mother this morning she did not know who she was and her mother has been awake in the chair crying, scared and not talking to the daughter.Daughter voices concern that one side of her mother's mouth "looks up and one side looks down". Mother is alert to self at this time,able to state birthday and able to move all extremities. Pt has an appt for tomorrow at 8am with Sharen Hones, but due to current concerns, advised pt to go to the ER for further evaluation. Pt's daughter verbalized understanding and is going to to call EMS for mother to to to ER.

## 2017-03-04 ENCOUNTER — Observation Stay
Admission: EM | Admit: 2017-03-04 | Discharge: 2017-03-05 | Disposition: A | Discharge status: Home / Self Care | Payer: Medicare Other | Attending: Internal Medicine | Admitting: Internal Medicine

## 2017-03-04 ENCOUNTER — Observation Stay: Payer: Medicare Other

## 2017-03-04 ENCOUNTER — Emergency Department: Payer: Medicare Other

## 2017-03-04 ENCOUNTER — Encounter: Payer: Self-pay | Admitting: Emergency Medicine

## 2017-03-04 ENCOUNTER — Inpatient Hospital Stay: Payer: Medicare Other | Admitting: Family Medicine

## 2017-03-04 DIAGNOSIS — Z79899 Other long term (current) drug therapy: Secondary | ICD-10-CM | POA: Insufficient documentation

## 2017-03-04 DIAGNOSIS — Z7901 Long term (current) use of anticoagulants: Secondary | ICD-10-CM | POA: Diagnosis not present

## 2017-03-04 DIAGNOSIS — N3001 Acute cystitis with hematuria: Principal | ICD-10-CM | POA: Insufficient documentation

## 2017-03-04 DIAGNOSIS — I5032 Chronic diastolic (congestive) heart failure: Secondary | ICD-10-CM | POA: Diagnosis not present

## 2017-03-04 DIAGNOSIS — F418 Other specified anxiety disorders: Secondary | ICD-10-CM | POA: Insufficient documentation

## 2017-03-04 DIAGNOSIS — Z86711 Personal history of pulmonary embolism: Secondary | ICD-10-CM | POA: Diagnosis not present

## 2017-03-04 DIAGNOSIS — J9610 Chronic respiratory failure, unspecified whether with hypoxia or hypercapnia: Secondary | ICD-10-CM | POA: Insufficient documentation

## 2017-03-04 DIAGNOSIS — D638 Anemia in other chronic diseases classified elsewhere: Secondary | ICD-10-CM | POA: Insufficient documentation

## 2017-03-04 DIAGNOSIS — K219 Gastro-esophageal reflux disease without esophagitis: Secondary | ICD-10-CM | POA: Diagnosis not present

## 2017-03-04 DIAGNOSIS — M1990 Unspecified osteoarthritis, unspecified site: Secondary | ICD-10-CM | POA: Insufficient documentation

## 2017-03-04 DIAGNOSIS — I132 Hypertensive heart and chronic kidney disease with heart failure and with stage 5 chronic kidney disease, or end stage renal disease: Secondary | ICD-10-CM | POA: Insufficient documentation

## 2017-03-04 DIAGNOSIS — Z791 Long term (current) use of non-steroidal anti-inflammatories (NSAID): Secondary | ICD-10-CM | POA: Diagnosis not present

## 2017-03-04 DIAGNOSIS — Z8719 Personal history of other diseases of the digestive system: Secondary | ICD-10-CM | POA: Insufficient documentation

## 2017-03-04 DIAGNOSIS — Z87891 Personal history of nicotine dependence: Secondary | ICD-10-CM | POA: Diagnosis not present

## 2017-03-04 DIAGNOSIS — Z7902 Long term (current) use of antithrombotics/antiplatelets: Secondary | ICD-10-CM | POA: Insufficient documentation

## 2017-03-04 DIAGNOSIS — N186 End stage renal disease: Secondary | ICD-10-CM | POA: Diagnosis not present

## 2017-03-04 DIAGNOSIS — E785 Hyperlipidemia, unspecified: Secondary | ICD-10-CM | POA: Diagnosis not present

## 2017-03-04 DIAGNOSIS — E1122 Type 2 diabetes mellitus with diabetic chronic kidney disease: Secondary | ICD-10-CM | POA: Insufficient documentation

## 2017-03-04 DIAGNOSIS — Z86718 Personal history of other venous thrombosis and embolism: Secondary | ICD-10-CM | POA: Insufficient documentation

## 2017-03-04 DIAGNOSIS — R4182 Altered mental status, unspecified: Secondary | ICD-10-CM | POA: Diagnosis present

## 2017-03-04 DIAGNOSIS — R001 Bradycardia, unspecified: Secondary | ICD-10-CM | POA: Diagnosis not present

## 2017-03-04 DIAGNOSIS — Z888 Allergy status to other drugs, medicaments and biological substances status: Secondary | ICD-10-CM | POA: Insufficient documentation

## 2017-03-04 DIAGNOSIS — N2581 Secondary hyperparathyroidism of renal origin: Secondary | ICD-10-CM | POA: Insufficient documentation

## 2017-03-04 DIAGNOSIS — R1031 Right lower quadrant pain: Secondary | ICD-10-CM

## 2017-03-04 DIAGNOSIS — B9620 Unspecified Escherichia coli [E. coli] as the cause of diseases classified elsewhere: Secondary | ICD-10-CM | POA: Insufficient documentation

## 2017-03-04 DIAGNOSIS — G9340 Encephalopathy, unspecified: Secondary | ICD-10-CM | POA: Diagnosis not present

## 2017-03-04 DIAGNOSIS — Z992 Dependence on renal dialysis: Secondary | ICD-10-CM | POA: Diagnosis not present

## 2017-03-04 HISTORY — DX: Other specified postprocedural states: Z98.890

## 2017-03-04 LAB — CBC WITH DIFFERENTIAL/PLATELET
BASOS PCT: 1 %
Basophils Absolute: 0.1 10*3/uL (ref 0–0.1)
EOS ABS: 0.1 10*3/uL (ref 0–0.7)
Eosinophils Relative: 1 %
HEMATOCRIT: 34.1 % — AB (ref 35.0–47.0)
HEMOGLOBIN: 11.2 g/dL — AB (ref 12.0–16.0)
Lymphocytes Relative: 11 %
Lymphs Abs: 0.8 10*3/uL — ABNORMAL LOW (ref 1.0–3.6)
MCH: 33.8 pg (ref 26.0–34.0)
MCHC: 32.9 g/dL (ref 32.0–36.0)
MCV: 102.8 fL — ABNORMAL HIGH (ref 80.0–100.0)
Monocytes Absolute: 0.6 10*3/uL (ref 0.2–0.9)
Monocytes Relative: 8 %
NEUTROS ABS: 5.7 10*3/uL (ref 1.4–6.5)
NEUTROS PCT: 79 %
Platelets: 181 10*3/uL (ref 150–440)
RBC: 3.31 MIL/uL — ABNORMAL LOW (ref 3.80–5.20)
RDW: 13.6 % (ref 11.5–14.5)
WBC: 7.3 10*3/uL (ref 3.6–11.0)

## 2017-03-04 LAB — COMPREHENSIVE METABOLIC PANEL
ALBUMIN: 3.5 g/dL (ref 3.5–5.0)
ALK PHOS: 127 U/L — AB (ref 38–126)
ALT: 10 U/L — ABNORMAL LOW (ref 14–54)
AST: 19 U/L (ref 15–41)
Anion gap: 12 (ref 5–15)
BILIRUBIN TOTAL: 0.6 mg/dL (ref 0.3–1.2)
BUN: 26 mg/dL — AB (ref 6–20)
CALCIUM: 8.6 mg/dL — AB (ref 8.9–10.3)
CO2: 25 mmol/L (ref 22–32)
Chloride: 102 mmol/L (ref 101–111)
Creatinine, Ser: 6.37 mg/dL — ABNORMAL HIGH (ref 0.44–1.00)
GFR calc Af Amer: 7 mL/min — ABNORMAL LOW (ref >60)
GFR calc non Af Amer: 6 mL/min — ABNORMAL LOW (ref >60)
GLUCOSE: 160 mg/dL — AB (ref 65–99)
POTASSIUM: 3.4 mmol/L — AB (ref 3.5–5.1)
SODIUM: 139 mmol/L (ref 135–145)
TOTAL PROTEIN: 7.2 g/dL (ref 6.5–8.1)

## 2017-03-04 LAB — URINALYSIS, COMPLETE (UACMP) WITH MICROSCOPIC
BILIRUBIN URINE: NEGATIVE
Glucose, UA: NEGATIVE mg/dL
KETONES UR: NEGATIVE mg/dL
NITRITE: NEGATIVE
PH: 6 (ref 5.0–8.0)
PROTEIN: 100 mg/dL — AB
Specific Gravity, Urine: 1.013 (ref 1.005–1.030)

## 2017-03-04 LAB — MRSA PCR SCREENING: MRSA BY PCR: NEGATIVE

## 2017-03-04 LAB — BLOOD GAS, VENOUS
Acid-Base Excess: 2.4 mmol/L — ABNORMAL HIGH (ref 0.0–2.0)
Bicarbonate: 27.2 mmol/L (ref 20.0–28.0)
O2 SAT: 76.1 %
PCO2 VEN: 42 mmHg — AB (ref 44.0–60.0)
PH VEN: 7.42 (ref 7.250–7.430)
PO2 VEN: 40 mmHg (ref 32.0–45.0)
Patient temperature: 37

## 2017-03-04 LAB — VITAMIN B12: Vitamin B-12: 360 pg/mL (ref 180–914)

## 2017-03-04 LAB — TSH: TSH: 2.327 u[IU]/mL (ref 0.350–4.500)

## 2017-03-04 LAB — TROPONIN I: Troponin I: 0.04 ng/mL (ref <0.03)

## 2017-03-04 LAB — GLUCOSE, CAPILLARY: GLUCOSE-CAPILLARY: 158 mg/dL — AB (ref 65–99)

## 2017-03-04 MED ORDER — LIDOCAINE-PRILOCAINE 2.5-2.5 % EX CREA
1.0000 "application " | TOPICAL_CREAM | CUTANEOUS | Status: DC | PRN
  Filled 2017-03-04: qty 5

## 2017-03-04 MED ORDER — ACETAMINOPHEN 650 MG RE SUPP: 650.0000 mg | Freq: Four times a day (QID) | RECTAL | Status: DC | PRN

## 2017-03-04 MED ORDER — QUETIAPINE FUMARATE 25 MG PO TABS
12.5000 mg | ORAL_TABLET | Freq: Every day | ORAL | Status: DC
  Administered 2017-03-04: 12.5 mg via ORAL
  Filled 2017-03-04: qty 1

## 2017-03-04 MED ORDER — CIPROFLOXACIN IN D5W 400 MG/200ML IV SOLN
400.0000 mg | INTRAVENOUS | Status: DC
  Administered 2017-03-05: 400 mg via INTRAVENOUS
  Filled 2017-03-04 (×2): qty 200

## 2017-03-04 MED ORDER — APIXABAN 2.5 MG PO TABS
2.5000 mg | ORAL_TABLET | Freq: Two times a day (BID) | ORAL | Status: DC
  Administered 2017-03-04 – 2017-03-05 (×3): 2.5 mg via ORAL
  Filled 2017-03-04 (×3): qty 1

## 2017-03-04 MED ORDER — CALCIUM ACETATE (PHOS BINDER) 667 MG PO CAPS
1334.0000 mg | ORAL_CAPSULE | Freq: Three times a day (TID) | ORAL | Status: DC
  Administered 2017-03-04 – 2017-03-05 (×4): 1334 mg via ORAL
  Filled 2017-03-04 (×3): qty 2

## 2017-03-04 MED ORDER — ADULT MULTIVITAMIN W/MINERALS CH
1.0000 | ORAL_TABLET | Freq: Every day | ORAL | Status: DC
  Administered 2017-03-04 – 2017-03-05 (×2): 1 via ORAL
  Filled 2017-03-04 (×2): qty 1

## 2017-03-04 MED ORDER — FAMOTIDINE 20 MG PO TABS
40.0000 mg | ORAL_TABLET | Freq: Every evening | ORAL | Status: DC
  Administered 2017-03-04: 40 mg via ORAL
  Filled 2017-03-04 (×2): qty 2

## 2017-03-04 MED ORDER — PANTOPRAZOLE SODIUM 40 MG PO TBEC
40.0000 mg | DELAYED_RELEASE_TABLET | Freq: Every day | ORAL | Status: DC
  Administered 2017-03-04 – 2017-03-05 (×2): 40 mg via ORAL
  Filled 2017-03-04 (×2): qty 1

## 2017-03-04 MED ORDER — CALCIUM ACETATE (PHOS BINDER) 667 MG PO CAPS
2001.0000 mg | ORAL_CAPSULE | Freq: Three times a day (TID) | ORAL | Status: DC
  Administered 2017-03-04: 1334 mg via ORAL
  Filled 2017-03-04: qty 3

## 2017-03-04 MED ORDER — LORAZEPAM 2 MG/ML IJ SOLN: 0.5000 mg | Freq: Once | INTRAMUSCULAR | Status: DC | PRN

## 2017-03-04 MED ORDER — MOMETASONE FURO-FORMOTEROL FUM 200-5 MCG/ACT IN AERO
2.0000 | INHALATION_SPRAY | Freq: Two times a day (BID) | RESPIRATORY_TRACT | Status: DC
  Administered 2017-03-04 – 2017-03-05 (×2): 2 via RESPIRATORY_TRACT
  Filled 2017-03-04: qty 8.8

## 2017-03-04 MED ORDER — DIPHENHYDRAMINE HCL 50 MG/ML IJ SOLN
12.5000 mg | Freq: Once | INTRAMUSCULAR | Status: AC
  Administered 2017-03-04: 12.5 mg via INTRAVENOUS
  Filled 2017-03-04: qty 1

## 2017-03-04 MED ORDER — ATORVASTATIN CALCIUM 20 MG PO TABS
40.0000 mg | ORAL_TABLET | Freq: Every day | ORAL | Status: DC
  Administered 2017-03-04 – 2017-03-05 (×2): 40 mg via ORAL
  Filled 2017-03-04 (×2): qty 2

## 2017-03-04 MED ORDER — FUROSEMIDE 40 MG PO TABS
80.0000 mg | ORAL_TABLET | Freq: Every day | ORAL | Status: DC
  Administered 2017-03-04 – 2017-03-05 (×2): 80 mg via ORAL
  Filled 2017-03-04 (×3): qty 2

## 2017-03-04 MED ORDER — HEPARIN SODIUM (PORCINE) 5000 UNIT/ML IJ SOLN: 5000.0000 [IU] | Freq: Three times a day (TID) | INTRAMUSCULAR | Status: DC

## 2017-03-04 MED ORDER — ACETAMINOPHEN 325 MG PO TABS: 650.0000 mg | ORAL_TABLET | Freq: Four times a day (QID) | ORAL | Status: DC | PRN

## 2017-03-04 MED ORDER — CALCITRIOL 0.25 MCG PO CAPS
0.2500 ug | ORAL_CAPSULE | ORAL | Status: DC
  Administered 2017-03-05: 0.25 ug via ORAL
  Filled 2017-03-04: qty 1

## 2017-03-04 MED ORDER — CLOPIDOGREL BISULFATE 75 MG PO TABS
75.0000 mg | ORAL_TABLET | Freq: Every day | ORAL | Status: DC
  Administered 2017-03-04 – 2017-03-05 (×2): 75 mg via ORAL
  Filled 2017-03-04 (×2): qty 1

## 2017-03-04 MED ORDER — CALCIUM ACETATE (PHOS BINDER) 667 MG PO CAPS: 667.0000 mg | ORAL_CAPSULE | ORAL | Status: DC | PRN

## 2017-03-04 MED ORDER — CIPROFLOXACIN IN D5W 400 MG/200ML IV SOLN
400.0000 mg | Freq: Once | INTRAVENOUS | Status: AC
  Administered 2017-03-04: 400 mg via INTRAVENOUS
  Filled 2017-03-04: qty 200

## 2017-03-04 MED ORDER — ALBUTEROL SULFATE (2.5 MG/3ML) 0.083% IN NEBU: 2.5000 mg | INHALATION_SOLUTION | RESPIRATORY_TRACT | Status: DC | PRN

## 2017-03-04 MED ORDER — ALBUTEROL SULFATE HFA 108 (90 BASE) MCG/ACT IN AERS: 2.0000 | INHALATION_SPRAY | RESPIRATORY_TRACT | Status: DC | PRN

## 2017-03-04 MED ORDER — DONEPEZIL HCL 5 MG PO TABS
5.0000 mg | ORAL_TABLET | Freq: Every day | ORAL | Status: DC
  Administered 2017-03-04: 5 mg via ORAL
  Filled 2017-03-04: qty 1

## 2017-03-04 MED ORDER — CINACALCET HCL 30 MG PO TABS
30.0000 mg | ORAL_TABLET | Freq: Every day | ORAL | Status: DC
  Administered 2017-03-05: 30 mg via ORAL
  Filled 2017-03-04 (×2): qty 1

## 2017-03-04 MED ORDER — ALPRAZOLAM 0.25 MG PO TABS: 0.2500 mg | ORAL_TABLET | Freq: Every day | ORAL | Status: DC | PRN

## 2017-03-04 MED ORDER — ORAL CARE MOUTH RINSE
15.0000 mL | Freq: Two times a day (BID) | OROMUCOSAL | Status: DC
  Administered 2017-03-05: 15 mL via OROMUCOSAL

## 2017-03-04 NOTE — ED Notes (Signed)
MD notified of patient's periods of intermittent bradycardia into the 40's, pt then returns to HR of 72.

## 2017-03-04 NOTE — ED Triage Notes (Signed)
Pt arrived to the ED via EMS from home for complaints of confusion. According to EMS the Pt is a dialysis patiens and started antibiotics to treat a UTI yesterday. Pt understands that she is confused and not feeling well. Pt is AOx3 in no apparent distress.

## 2017-03-04 NOTE — ED Notes (Signed)
Admitting MD at bedside at this time.

## 2017-03-04 NOTE — Progress Notes (Signed)
Pharmacy Antibiotic Note  Ariel Smith is a 71 y.o. female with a h/o ESRD admitted on 03/04/2017 with a recent h/o E coli UTI apparently failing outpatient Ceftin. Pharmacy has been consulted for Cipro dosing.  Plan: Ciprofloxacin 400 mg iv q 24 hours.   Height: 5\' 5"  (165.1 cm) Weight: 239 lb (108.4 kg) IBW/kg (Calculated) : 57  Temp (24hrs), Avg:98.7 F (37.1 C), Min:98.7 F (37.1 C), Max:98.7 F (37.1 C)  Recent Labs  Lab 02/27/17 1050 03/04/17 0715  WBC 6.2 7.3  CREATININE 7.36* 6.37*    Estimated Creatinine Clearance: 10.1 mL/min (A) (by C-G formula based on SCr of 6.37 mg/dL (H)).    Allergies  Allergen Reactions  . Nodolor [Isometheptene-Dichloral-Apap] Other (See Comments)    Reaction:  Headaches     Antimicrobials this admission: Ceftin PTA Ciprofloxacin 11/15 >>  Dose adjustments this admission:   Microbiology results: 11/10 UCx: E coli pan-sensitive except ampicillin and Unasyn 11/15 UCx: sent  Thank you for allowing pharmacy to be a part of this patient's care.  Luisa Hart D 03/04/2017 12:30 PM

## 2017-03-04 NOTE — ED Notes (Signed)
MD notified of patient complaints of burning after starting Cipro. Pt's inner forearm noted to be at this time. Cipro paused pending MD evaluation.

## 2017-03-04 NOTE — ED Notes (Signed)
Pt returned from Xray at this time  

## 2017-03-04 NOTE — ED Notes (Signed)
Pt's daughter states pt was recently dx with UTI, sent home with Ceftin. Pt's daughter reports increasing confusion over the last several days, reports yesterday when she picked her up from dialysis she did not recognize her. Pt is currently alert and oriented at this time, however pt is noted to be slow to answer questions and is noted to be tearful when this RN started IV.

## 2017-03-04 NOTE — H&P (Signed)
[facility] [place] [facility]   PATIENT NAME: Ariel Smith    MR#:  [id number]  DATE OF BIRTH:  [birth year]  DATE OF ADMISSION:  03/04/2017  PRIMARY CARE PHYSICIAN: [name], MD   REQUESTING/REFERRING PHYSICIAN: Dr Cinda Quest  CHIEF COMPLAINT:   Chief Complaint  Patient presents with  . Altered Mental Status    HISTORY OF PRESENT ILLNESS:  Ariel Smith  is a 71 y.o. female with altered mental status since last Wednesday.  She was diagnosed with a urinary tract infection for E. coli and started on Ceftin 500 mg twice daily.  She is still been confused.  The patient stated she could not move her feet good.  Now she is doing a little bit better.  Daughter at the bedside confirmed that there were periods of time where she did not know her family and she is very scared and curled up into a ball.  Yesterday she fell out of the chair.  She is not been sleeping very well and been agitated and restless at night.  Decreased mobility and strength.  In the ER, on the heart monitor she has been having periods of bradycardia with heart rates going into the 30s and 40s.  Also having periods of time where her heart rate is in the 80s.  Again on urine analysis it does look positive.  Hospitalist services contacted for further evaluation.  Patient complains of some pain in her right groin.  PAST MEDICAL HISTORY:   Past Medical History:  Diagnosis Date  . Acute DVT (deep venous thrombosis) (HCC) 2017   hospitalization  . Anemia of chronic disease   . Bronchiectasis (HCC) 08/2014    suggested by thoracic xray  . Chronic diastolic CHF (congestive heart failure) (HCC)    a. echo 10/2014: EF 60-65%, no RWMA, GR1DD, severe AS, mild MR, PASP 46 mm Hg  . Chronic respiratory failure (HCC)    a. 2/2 COPD; b. on 4-5L via nasal cannula  . COPD (chronic obstructive pulmonary disease) (HCC)   . Depression   . ESRD (end stage renal disease) (HCC) 08/2011   a. on HD  (TThSa), L forearm AV fistula, [name]  . Frequent headaches   . GERD (gastroesophageal reflux disease)   . GIB (gastrointestinal bleeding)    a. leading to cessation of warfarin 11/2014  . Heart murmur   . History of colon polyps 2013   colonoscopy ([name])  . History of DVT of lower extremity 2009, 2017   left sided x2, with PE s/p IVC filter placement, coumadin stopped 2/2 GI bleed  . HLD (hyperlipidemia)   . HTN (hypertension)   . Morbid obesity (HCC)   . Osteoarthritis   . Osteopenia 01/2013  . Pulmonary embolism (HCC) 2009  . Renal failure    Patient has been on dialysis for " four years" -per patient.  . 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 Secondary hyperparathyroidism of renal origin (HCC)   . Severe aortic stenosis    a. echo 10/2014: EF 60-65%, no RWMA, GR1DD, mod to sev AS (peak vel 377 cm/s, mean gradient 34 mm Hg, peak gradient 57 mm Hg, valve area (VTA) 0.72 cm^2    PAST SURGICAL HISTORY:   Past Surgical History:  Procedure Laterality Date  . A/V FISTULAGRAM Left 07/09/2016   Procedure: A/V Fistulagram;  Surgeon: [name], MD;  Location: [facility] INVASIVE CV LAB;  Service: Cardiovascular;  Laterality: Left;  . BUNIONECTOMY Left 2003  . CARDIAC CATHETERIZATION N/A 01/18/2015   patent coronary arteries without significant osbtruction and preserved LV function, severe aortic stenosis; Procedure: Right/Left Heart Cath and Coronary Angiography;  Surgeon: Tonny BollmanMichael Cooper, MD  . CHOLECYSTECTOMY  2012  . COLONOSCOPY  08/2011   colon biopsies, Dr. Birdie SonsByrnette  . ESOPHAGOGASTRODUODENOSCOPY  08/2011   gastric cardia polyp  . ESOPHAGOGASTRODUODENOSCOPY Left 11/11/2014   Procedure: ESOPHAGOGASTRODUODENOSCOPY (EGD);  Surgeon: Wallace CullensPaul Y Oh, MD;  [facility];  Service: Endoscopy;  Laterality: Left;  . EXTERIORIZATION OF A CONTINUOUS AMBULATORY  PERITONEAL DIALYSIS CATHETER  01/2013   removal 12/2103 - Dr. Wyn Quakerew  . hospitalization  12/2013   recurrent R pleural effusion due to peritoneal fluid translocation s/p rpt thoracentesis with 1.3 L fluid removed, ERSD started on HD this hospitalization  . PERIPHERAL VASCULAR CATHETERIZATION N/A 11/12/2014   Procedure: IVC Filter Insertion;  Surgeon: Annice NeedyJason S Dew, MD;  Location: [facility] INVASIVE CV LAB;  Service: Cardiovascular;  Laterality: N/A;  . PERIPHERAL VASCULAR CATHETERIZATION Left 10/24/2015   Procedure: Lower Extremity Venography with intervention (thormbolysis/thrombectomy);  Surgeon: Annice NeedyJason S Dew, MD;  [facility];  Service: Cardiovascular;  Laterality: Left;  . REPLACEMENT TOTAL KNEE Left 2006  . SHOULDER ARTHROSCOPY Right 2009  . TEE WITHOUT CARDIOVERSION N/A 02/19/2015   Procedure: TRANSESOPHAGEAL ECHOCARDIOGRAM (TEE);  Surgeon: Tonny BollmanMichael Cooper, MD;  [facility];  Service: Open Heart Surgery;  Laterality: N/A;  . TONSILLECTOMY  1955  . TRANSCATHETER AORTIC VALVE REPLACEMENT, TRANSFEMORAL Right 02/19/2015   Procedure: TRANSCATHETER AORTIC VALVE REPLACEMENT, TRANSFEMORAL;  Surgeon: Tonny BollmanMichael Cooper, MD;  [facility];  Service: Open Heart Surgery;  Laterality: Right;  . TUBAL LIGATION  1980  . US ECHOCARDIOGRAPHY  12/2013   EF 55-60%, nl LV sys fxn, mild-mod MR, AS, increased LV posterior wall thickness, mild TR    SOCIAL HISTORY:   Social History   Tobacco Use  . Smoking status: Former Smoker    Packs/day: 2.00    Years: 30.00    Pack years: 60.00    Types: Cigarettes    Start date: 04/20/1978    Last attempt to quit: 04/21/2007    Years since quitting: 9.8  . Smokeless tobacco: Never Used  Substance Use Topics  . Alcohol use: No    FAMILY HISTORY:   Family History  Problem Relation Age of Onset  . Deep vein thrombosis Brother   . Cancer Mother [id number]       colon  . Stroke Mother   . Cancer Father        prostate  . Hypertension Father   . Dementia Father  [id number]  . Hypertension Brother   . Breast cancer Daughter [id number]  . Diabetes Neg Hx   . CAD Neg Hx     DRUG ALLERGIES:   Allergies  Allergen Reactions  . Nodolor [Isometheptene-Dichloral-Apap] Other (See Comments)    Reaction:  Headaches     REVIEW OF SYSTEMS:  CONSTITUTIONAL: No fever, chills or sweats.  Positive for weakness.  EYES: No blurred or double vision.  Wears glasses. EARS, NOSE, AND THROAT: No tinnitus or ear pain. No sore throat.  Positive for runny nose and postnasal drip.  Positive for decreased hearing. RESPIRATORY: Positive for cough and shortness of breath, no wheezing or hemoptysis.  CARDIOVASCULAR: No chest pain, orthopnea, edema.  GASTROINTESTINAL: No nausea, vomiting, diarrhea.  Positive for abdominal pain abdominal pain. No blood  in bowel movements.  History of constipation GENITOURINARY: No dysuria, hematuria.  Increased urinary frequency. ENDOCRINE: No polyuria, nocturia,  HEMATOLOGY: No anemia, easy bruising or bleeding SKIN: No rash or lesion. MUSCULOSKELETAL: Positive for leg pain and wrist pain.  Patient complains of pain in her right groin NEUROLOGIC: No tingling, numbness, weakness.  PSYCHIATRY: History of anxiety and depression.   MEDICATIONS AT HOME:   Prior to Admission medications   Medication Sig Start Date End Date Taking? Authorizing Provider  albuterol (VENTOLIN HFA) 108 (90 BASE) MCG/ACT inhaler Inhale 2 puffs into the lungs every 4 (four) hours as needed for wheezing or shortness of breath. 04/08/15  Yes [name], MD  ALPRAZolam Prudy Feeler) 1 MG tablet TAKE ONE TABLET BY MOUTH AT BEDTIME Patient taking differently: 3 times a week on dialysis days 10/23/16  Yes [name], MD  atorvastatin (LIPITOR) 40 MG tablet TAKE 1 TABLET BY MOUTH ONCE DAILY AT  6PM. NEEDS OFFICE VISIT. 12/07/16  Yes [name], MD  budesonide-formoterol [facility]) 160-4.5 MCG/ACT inhaler Inhale 2 puffs into the lungs 2 (two) times daily.   Yes [provider]  calcitRIOL (ROCALTROL) 0.25 MCG capsule Take 0.25 mcg 3 (three) times a week by mouth.    Yes [provider]  calcium acetate (PHOSLO) 667 MG capsule Take 667-2,001 mg by mouth 3 (three) times daily with meals. Take 3 with meals and 1 with a snack   Yes [provider]  cefUROXime (CEFTIN) 500 MG tablet Take 1 tablet (500 mg total) 2 (two) times daily for 10 days by mouth. 02/27/17 03/09/17 Yes Schaevitz, Myra Rude, MD  cinacalcet (SENSIPAR) 30 MG tablet Take 30 mg by mouth daily.   Yes [provider]  clopidogrel (PLAVIX) 75 MG tablet TAKE ONE TABLET BY MOUTH ONCE DAILY WITH BREAKFAST 08/06/16  Yes Gollan, Tollie Pizza, MD  diphenhydrAMINE (BENADRYL) 25 MG tablet Take 25 mg by mouth every 6 (six) hours as needed.   Yes [provider]  donepezil (ARICEPT) 5 MG tablet Take 1 tablet (5 mg total) by mouth at bedtime. 04/09/16  Yes [name], MD  ELIQUIS 2.5 MG TABS tablet TAKE ONE TABLET BY MOUTH TWICE DAILY 10/05/16  Yes [name], MD  famotidine (PEPCID) 40 MG tablet Take 1 tablet (40 mg total) by mouth every evening. 10/26/16 10/26/17 Yes Merwyn Katos, MD  furosemide (LASIX) 80 MG tablet Take 80 mg daily by mouth.   Yes [provider]  lidocaine-prilocaine (EMLA) cream Apply 1 application topically as needed (topical anesthesia for hemodialysis if Gebauers and Lidocaine injection are ineffective.). 11/22/14  Yes Auburn Bilberry, MD  Multiple Vitamin (MULTIVITAMIN WITH MINERALS) TABS tablet Take 1 tablet by mouth daily.   Yes [provider]  pantoprazole (PROTONIX) 40 MG tablet TAKE ONE TABLET BY MOUTH ONCE DAILY AS NEEDED 08/17/16  Yes [name], MD  traZODone (DESYREL) 50 MG tablet TAKE 1 & 1/2 (ONE & ONE-HALF) TABLETS BY MOUTH AT BEDTIME 12/07/16  Yes [name], MD  acetaminophen (TYLENOL) 500 MG tablet Take 1,000 mg by mouth every 6 (six) hours as needed for mild pain or headache.     [provider]      VITAL SIGNS:  Blood pressure (!) 128/54, pulse 88, temperature 98.7 F (37.1 C), temperature source Oral, resp. rate (!) 22, height 5\' 5"  (1.651 m), weight 108.4 kg (239 lb), SpO2 99 %.  PHYSICAL EXAMINATION:  GENERAL:  71 y.o.-year-old patient lying in the bed with no acute distress.  EYES: Pupils equal, round, reactive to light and accommodation. No scleral icterus. Extraocular muscles intact.  HEENT: Head atraumatic, normocephalic. Oropharynx and nasopharynx clear.  NECK:  Supple, no jugular venous distention. No thyroid enlargement, no tenderness.  LUNGS: Normal breath sounds bilaterally, no wheezing, rales,rhonchi or crepitation. No use of accessory muscles of respiration.  CARDIOVASCULAR: S1, S2 normal.  2-6 systolic murmurs.  No rubs, or gallops.  ABDOMEN: Soft, nontender, nondistended. Bowel sounds present. No organomegaly or mass.  EXTREMITIES:  trace pedal edema, no cyanosis, or clubbing.  NEUROLOGIC: Cranial nerves II through XII are intact. Muscle strength 5/5 in all extremities. Sensation intact. Gait not checked.  Patient able to straight leg raise without a problem. PSYCHIATRIC: The patient is alert and looks a daughter for answering questions.  SKIN: No rash, lesion, or ulcer.   LABORATORY PANEL:   CBC Recent Labs  Lab 03/04/17 0715  WBC 7.3  HGB 11.2*  HCT 34.1*  PLT 181   ------------------------------------------------------------------------------------------------------------------  Chemistries  Recent Labs  Lab 03/04/17 0715  NA 139  K 3.4*  CL 102  CO2 25  GLUCOSE 160*  BUN 26*  CREATININE 6.37*  CALCIUM 8.6*  AST 19  ALT 10*  ALKPHOS 127*  BILITOT 0.6   ------------------------------------------------------------------------------------------------------------------  Cardiac Enzymes Recent Labs  Lab 03/04/17 0715  TROPONINI 0.04*    ------------------------------------------------------------------------------------------------------------------  RADIOLOGY:  Dg Chest 1 View  Result Date: 03/04/2017 CLINICAL DATA:  Altered mental status.  Dialysis patient. EXAM: CHEST 1 VIEW COMPARISON:  10/26/2016 FINDINGS: Cardiac enlargement. T AVR unchanged. Negative for heart failure. Elevated right hemidiaphragm with mild right lower lobe atelectasis. Left lung clear. Atherosclerotic aortic arch. IMPRESSION: Cardiac enlargement without heart failure Mild right lower lobe atelectasis. Electronically Signed   By: Marlan Palau M.D.   On: 03/04/2017 08:46   Ct Head Wo Contrast  Result Date: 03/04/2017 CLINICAL DATA:  Confusion. Urinary tract infection. Altered level of consciousness. EXAM: CT HEAD WITHOUT CONTRAST TECHNIQUE: Contiguous axial images were obtained from the base of the skull through the vertex without intravenous contrast. COMPARISON:  02/27/2017 FINDINGS: Brain: The brainstem, cerebellum, cerebral peduncles, thalami, basal ganglia, basilar cisterns, and ventricular system appear within normal limits. Periventricular white matter and corona radiata hypodensities favor chronic ischemic microvascular white matter disease. No intracranial hemorrhage, mass lesion, or acute CVA. Vascular: There is atherosclerotic calcification of the cavernous carotid arteries bilaterally. Skull: Unremarkable Sinuses/Orbits: Unremarkable Other: No supplemental non-categorized findings. IMPRESSION: 1. No acute intracranial findings. 2. Periventricular white matter and corona radiata hypodensities favor chronic ischemic microvascular white matter disease. 3. Atherosclerosis. Electronically Signed   By: Gaylyn Rong M.D.   On: 03/04/2017 07:55    EKG:   Sinus rhythm 66 bpm  IMPRESSION AND PLAN:   1.  Acute encephalopathy and partially treated urinary tract infection.  Urine analysis positive.  Urine culture sent off.  Previous urine  culture grew E. coli.  ER physician ordered Cipro since she was on Ceftin as outpatient.  Will get MRI of the brain to rule out stroke.  Physical therapy evaluation.  For agitation at night will start low-dose Seroquel to try to get the patient sleeping at night.  Discontinue Benadryl. 2.  Episodes of bradycardia.  In reviewing her home medications she is not on any rate controlling medications.  Watch closely for sick sinus syndrome.  Cardiology evaluation. 3.  End-stage renal disease patient gets dialysis Monday Wednesday Friday. 4.  Anxiety depression.  Lowered the dose of Xanax. 5.  Left hand tremor  chronic for the patient 6.  History of severe aortic stenosis status post T AVR.  On low-dose Eliquis for anticoagulation 7.  History of diastolic congestive heart failure no signs. 8.  History of DVT/ PE on Eliquis 9.  Weakness physical therapy evaluation 10.  Right groin pain we will get an x-ray of the pelvis. 11.  Hyperlipidemia unspecified on atorvastatin 12.  Essential hypertension on benazepril 13.  Borderline troponin in a dialysis patient with no signs of chest pain or shortness of breath.  I will not order further troponins 14 since the patient is on Aricept and will send off an RPR, B12 and TSH   All the records are reviewed and case discussed with ED provider. Management plans discussed with the patient, family and they are in agreement.  CODE STATUS: Full code  TOTAL TIME TAKING CARE OF THIS PATIENT: 50 minutes.    Alford Highland M.D on 03/04/2017 at 10:32 AM  Between 7am to 6pm - Pager - [phone]  After 6pm call admission pager [phone]  [facility] [facility]  [phone]  CC: Primary care physician; [name], MD

## 2017-03-04 NOTE — ED Notes (Signed)
Date and time results received: 03/04/17 0850  Test: Trop Critical Value: 0.04  Name of Provider Notified: Dr. Mayford Knife  Orders Received? Or Actions Taken?: Critical Results Acknowledged

## 2017-03-04 NOTE — ED Notes (Signed)
MD notified pt not c/o itching to her R inner wrist. Pt's R inner wrist noted to be red from scratching. Dr. Mayford Knife states to give benadryl, and allow Cipro to continue, pt c/o no other symptoms. Maricar, RN notified as well.

## 2017-03-04 NOTE — Progress Notes (Signed)
Patient admitted to unit. Oriented to room, call bell, and staff. Bed in lowest position. Fall safety plan reviewed. Full assessment to Epic. Skin assessment verified with Rodney Booze RN. Telemetry box verification with tele clerk and Rodney Booze RN- Box#: [id number]. Will continue to monitor. Daughter at bedside.

## 2017-03-04 NOTE — ED Notes (Signed)
Pt returned from CT at this time.  

## 2017-03-04 NOTE — ED Provider Notes (Signed)
[facility] Emergency Department Provider Note       Time seen: ----------------------------------------- 7:23 AM on 03/04/2017 -----------------------------------------  Level V caveat: History/ROS limited by altered mental status  I have reviewed the triage vital signs and the nursing notes.  HISTORY   Chief Complaint Altered Mental Status   HPI Ariel Smith is a 71 y.o. female with a history of end-stage renal disease on dialysis who presents to the ED for confusion.  Patient arrives stating she is confused and cannot give further review of systems her report.  Patient does report a recent UTI for which she is on Ceftin twice daily.  Family states she has taken it as prescribed but they are concerned the urine infection is gotten worse.  Past Medical History:  Diagnosis Date  . Acute DVT (deep venous thrombosis) (HCC) 2017   hospitalization  . Anemia of chronic disease   . Bronchiectasis (HCC) 08/2014    suggested by thoracic xray  . Chronic diastolic CHF (congestive heart failure) (HCC)    a. echo 10/2014: EF 60-65%, no RWMA, GR1DD, severe AS, mild MR, PASP 46 mm Hg  . Chronic respiratory failure (HCC)    a. 2/2 COPD; b. on 4-5L via nasal cannula  . COPD (chronic obstructive pulmonary disease) (HCC)   . Depression   . ESRD (end stage renal disease) (HCC) 08/2011   a. on HD (TThSa), L forearm AV fistula, [name]  . Frequent headaches   . GERD (gastroesophageal reflux disease)   . GIB (gastrointestinal bleeding)    a. leading to cessation of warfarin 11/2014  . Heart murmur   . History of colon polyps 2013   colonoscopy ([name])  . History of DVT of lower extremity 2009, 2017   left sided x2, with PE s/p IVC filter placement, coumadin stopped 2/2 GI bleed  . HLD (hyperlipidemia)   . HTN (hypertension)   . Morbid obesity (HCC)   . Osteoarthritis   . Osteopenia 01/2013  . Pulmonary embolism (HCC) 2009  . Renal failure    Patient has  been on dialysis for " four years" -per patient.  . 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 Secondary hyperparathyroidism of renal origin (HCC)   . Severe aortic stenosis    a. echo 10/2014: EF 60-65%, no RWMA, GR1DD, mod to sev AS (peak vel 377 cm/s, mean gradient 34 mm Hg, peak gradient 57 mm Hg, valve area (VTA) 0.72 cm^2    Patient Active Problem List   Diagnosis Date Noted  . Mass of chest wall, right 04/09/2016  . 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 Insomnia 04/08/2015  . 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 Chronic respiratory failure (HCC)   . Anemia of chronic disease   . Chronic diastolic CHF (congestive heart failure) (HCC)   . Obesity, Class II, BMI 35-39.9, with comorbidity 01/07/2015  . COPD (chronic obstructive pulmonary disease) (HCC) 12/20/2014  . DVT (deep venous thrombosis) (HCC) 11/16/2014  . Aortic valvar stenosis 11/16/2014  . Vitamin D deficiency 11/03/2014  . Memory deficit 11/01/2014  . Right-sided thoracic back pain 08/17/2014  . Advanced care planning/counseling discussion 03/30/2014  . Health maintenance examination 03/30/2014  . Vitamin B12 deficiency 01/27/2014  . Medicare annual wellness visit, subsequent 01/27/2013  . Osteopenia 01/18/2013  . Chronic cough 10/22/2012  .  Osteoarthritis   . MDD (major depressive disorder), recurrent episode, moderate (HCC)   . HTN (hypertension)   . HLD (hyperlipidemia)   . GERD (gastroesophageal reflux disease)   . End stage renal disease (HCC) 08/19/2011    Past Surgical History:  Procedure Laterality Date  . A/V FISTULAGRAM Left 07/09/2016   Procedure: A/V Fistulagram;  Surgeon: Annice NeedyJason S Dew, MD;  Location: [facility] INVASIVE CV LAB;  Service: Cardiovascular;  Laterality: Left;  . BUNIONECTOMY Left 2003  . CARDIAC  CATHETERIZATION N/A 01/18/2015   patent coronary arteries without significant osbtruction and preserved LV function, severe aortic stenosis; Procedure: Right/Left Heart Cath and Coronary Angiography;  Surgeon: Tonny BollmanMichael Cooper, MD  . CHOLECYSTECTOMY  2012  . COLONOSCOPY  08/2011   colon biopsies, Dr. Birdie SonsByrnette  . ESOPHAGOGASTRODUODENOSCOPY  08/2011   gastric cardia polyp  . ESOPHAGOGASTRODUODENOSCOPY Left 11/11/2014   Procedure: ESOPHAGOGASTRODUODENOSCOPY (EGD);  Surgeon: Wallace CullensPaul Y Oh, MD;  [facility];  Service: Endoscopy;  Laterality: Left;  . EXTERIORIZATION OF A CONTINUOUS AMBULATORY PERITONEAL DIALYSIS CATHETER  01/2013   removal 12/2103 - Dr. Wyn Quakerew  . hospitalization  12/2013   recurrent R pleural effusion due to peritoneal fluid translocation s/p rpt thoracentesis with 1.3 L fluid removed, ERSD started on HD this hospitalization  . PERIPHERAL VASCULAR CATHETERIZATION N/A 11/12/2014   Procedure: IVC Filter Insertion;  Surgeon: Annice NeedyJason S Dew, MD;  Location: [facility] INVASIVE CV LAB;  Service: Cardiovascular;  Laterality: N/A;  . PERIPHERAL VASCULAR CATHETERIZATION Left 10/24/2015   Procedure: Lower Extremity Venography with intervention (thormbolysis/thrombectomy);  Surgeon: Annice NeedyJason S Dew, MD;  [facility];  Service: Cardiovascular;  Laterality: Left;  . REPLACEMENT TOTAL KNEE Left 2006  . SHOULDER ARTHROSCOPY Right 2009  . TEE WITHOUT CARDIOVERSION N/A 02/19/2015   Procedure: TRANSESOPHAGEAL ECHOCARDIOGRAM (TEE);  Surgeon: Tonny BollmanMichael Cooper, MD;  [facility];  Service: Open Heart Surgery;  Laterality: N/A;  . TONSILLECTOMY  1955  . TRANSCATHETER AORTIC VALVE REPLACEMENT, TRANSFEMORAL Right 02/19/2015   Procedure: TRANSCATHETER AORTIC VALVE REPLACEMENT, TRANSFEMORAL;  Surgeon: Tonny BollmanMichael Cooper, MD;  [facility];  Service: Open Heart Surgery;  Laterality: Right;  . TUBAL LIGATION  1980  . US ECHOCARDIOGRAPHY  12/2013   EF 55-60%, nl LV sys fxn, mild-mod MR, AS, increased LV  posterior wall thickness, mild TR    Allergies Nodolor [isometheptene-dichloral-apap]  Social History Social History   Tobacco Use  . Smoking status: Former Smoker    Packs/day: 2.00    Years: 30.00    Pack years: 60.00    Types: Cigarettes    Start date: 04/20/1978    Last attempt to quit: 04/21/2007    Years since quitting: 9.8  . Smokeless tobacco: Never Used  Substance Use Topics  . Alcohol use: No  . Drug use: No    Review of Systems Unknown, positive for weakness and confusion  All systems negative/normal/unremarkable except as stated in the HPI  ____________________________________________   PHYSICAL EXAM:  VITAL SIGNS: ED Triage Vitals  Enc Vitals Group     BP 03/04/17 0650 (!) 128/54     Pulse Rate 03/04/17 0650 88     Resp 03/04/17 0650 (!) 22     Temp 03/04/17 0650 98.7 F (37.1 C)     Temp Source 03/04/17 0650 Oral     SpO2 03/04/17 0648 99 %     Weight 03/04/17 0651 239 lb (108.4 kg)     Height 03/04/17 0651 5\' 5"  (1.651 m)     Head Circumference --  Peak Flow --      Pain Score 03/04/17 0648 0     Pain Loc --      Pain Edu? --      Excl. in GC? --    Constitutional: Alert but disoriented. No distress Eyes: Conjunctivae are normal. Normal extraocular movements. ENT   Head: Normocephalic and atraumatic.   Nose: No congestion/rhinnorhea.   Mouth/Throat: Mucous membranes are moist.   Neck: No stridor. Cardiovascular: Normal rate, regular rhythm. No murmurs, rubs, or gallops. Respiratory: Normal respiratory effort without tachypnea nor retractions. Breath sounds are clear and equal bilaterally. No wheezes/rales/rhonchi. Gastrointestinal: Soft and nontender. Normal bowel sounds Musculoskeletal: Nontender with normal range of motion in extremities. No lower extremity tenderness nor edema. Neurologic:  Normal speech and language. No gross focal neurologic deficits are appreciated.  Skin:  Skin is warm, dry and intact. No rash  noted. Psychiatric: Flat affect, halting speech ____________________________________________  EKG: Interpreted by me.  Sinus rhythm rate of 66 bpm, normal PR interval, normal QRS, borderline long QT.  PACs are noted  ____________________________________________  ED COURSE:  Pertinent labs & imaging results that were available during my care of the patient were reviewed by me and considered in my medical decision making (see chart for details). Patient presents for altered mental status, we will assess with labs and imaging as indicated.   Procedures ____________________________________________   LABS (pertinent positives/negatives)  Labs Reviewed  CBC WITH DIFFERENTIAL/PLATELET - Abnormal; Notable for the following components:      Result Value   RBC 3.31 (*)    Hemoglobin 11.2 (*)    HCT 34.1 (*)    MCV 102.8 (*)    Lymphs Abs 0.8 (*)    All other components within normal limits  COMPREHENSIVE METABOLIC PANEL - Abnormal; Notable for the following components:   Potassium 3.4 (*)    Glucose, Bld 160 (*)    BUN 26 (*)    Creatinine, Ser 6.37 (*)    Calcium 8.6 (*)    ALT 10 (*)    Alkaline Phosphatase 127 (*)    GFR calc non Af Amer 6 (*)    GFR calc Af Amer 7 (*)    All other components within normal limits  TROPONIN I - Abnormal; Notable for the following components:   Troponin I 0.04 (*)    All other components within normal limits  BLOOD GAS, VENOUS - Abnormal; Notable for the following components:   pCO2, Ven 42 (*)    Acid-Base Excess 2.4 (*)    All other components within normal limits  GLUCOSE, CAPILLARY - Abnormal; Notable for the following components:   Glucose-Capillary 158 (*)    All other components within normal limits  URINE CULTURE  URINALYSIS, COMPLETE (UACMP) WITH MICROSCOPIC  CBG MONITORING, ED    RADIOLOGY  CT head/chest x-ray IMPRESSION: Cardiac enlargement without heart failure  Mild right lower lobe atelectasis. IMPRESSION: 1. No acute  intracranial findings. 2. Periventricular white matter and corona radiata hypodensities favor chronic ischemic microvascular white matter disease. 3. Atherosclerosis. ____________________________________________  DIFFERENTIAL DIAGNOSIS   UTI, urosepsis, electrolyte abnormality, CVA, medication side effect  FINAL ASSESSMENT AND PLAN  Altered mental status, UTI  Plan: Patient had presented for altered mental status and confusion. Patient's labs were as expected given her end-stage renal disease. Patient's imaging was reassuring.  She does have evidence of likely continued urinary tract infection.  I do not have another obvious reason identified for her altered mental status.  Troponin is  slightly elevated but again not surprising given her end-stage renal disease.  I will discussed with the hospitalist for observation.   Emily Filbert, MD   Note: This note was generated in part or whole with voice recognition software. Voice recognition is usually quite accurate but there are transcription errors that can and very often do occur. I apologize for any typographical errors that were not detected and corrected.     Emily Filbert, MD 03/04/17 [phone]

## 2017-03-04 NOTE — ED Notes (Signed)
Pt taken to CT at this time.

## 2017-03-05 ENCOUNTER — Encounter: Payer: Self-pay | Admitting: Nurse Practitioner

## 2017-03-05 DIAGNOSIS — R001 Bradycardia, unspecified: Secondary | ICD-10-CM

## 2017-03-05 DIAGNOSIS — R4182 Altered mental status, unspecified: Secondary | ICD-10-CM | POA: Diagnosis not present

## 2017-03-05 DIAGNOSIS — N3001 Acute cystitis with hematuria: Secondary | ICD-10-CM

## 2017-03-05 LAB — BASIC METABOLIC PANEL
ANION GAP: 9 (ref 5–15)
BUN: 40 mg/dL — ABNORMAL HIGH (ref 6–20)
CALCIUM: 8.5 mg/dL — AB (ref 8.9–10.3)
CO2: 28 mmol/L (ref 22–32)
Chloride: 102 mmol/L (ref 101–111)
Creatinine, Ser: 8.23 mg/dL — ABNORMAL HIGH (ref 0.44–1.00)
GFR calc Af Amer: 5 mL/min — ABNORMAL LOW (ref >60)
GFR calc non Af Amer: 4 mL/min — ABNORMAL LOW (ref >60)
GLUCOSE: 113 mg/dL — AB (ref 65–99)
Potassium: 4 mmol/L (ref 3.5–5.1)
Sodium: 139 mmol/L (ref 135–145)

## 2017-03-05 LAB — CBC
HCT: 31.3 % — ABNORMAL LOW (ref 35.0–47.0)
HEMOGLOBIN: 10.1 g/dL — AB (ref 12.0–16.0)
MCH: 33.4 pg (ref 26.0–34.0)
MCHC: 32.3 g/dL (ref 32.0–36.0)
MCV: 103.5 fL — AB (ref 80.0–100.0)
Platelets: 165 10*3/uL (ref 150–440)
RBC: 3.03 MIL/uL — ABNORMAL LOW (ref 3.80–5.20)
RDW: 13.6 % (ref 11.5–14.5)
WBC: 5.6 10*3/uL (ref 3.6–11.0)

## 2017-03-05 LAB — RPR: RPR Ser Ql: NONREACTIVE

## 2017-03-05 LAB — LACTIC ACID, PLASMA: LACTIC ACID, VENOUS: 1.6 mmol/L (ref 0.5–1.9)

## 2017-03-05 LAB — URINE CULTURE
Culture: NO GROWTH
Special Requests: NORMAL

## 2017-03-05 LAB — PHOSPHORUS: Phosphorus: 3.7 mg/dL (ref 2.5–4.6)

## 2017-03-05 MED ORDER — CIPROFLOXACIN HCL 500 MG PO TABS: 500.0000 mg | ORAL_TABLET | Freq: Every day | ORAL | 0 refills | Status: AC

## 2017-03-05 MED ORDER — EPOETIN ALFA 10000 UNIT/ML IJ SOLN: 4000.0000 [IU] | INTRAMUSCULAR | Status: DC

## 2017-03-05 MED ORDER — ALPRAZOLAM 0.25 MG PO TABS: 0.2500 mg | ORAL_TABLET | Freq: Every day | ORAL | 0 refills | Status: DC | PRN

## 2017-03-05 NOTE — Discharge Summary (Signed)
[facility] - [place] at [facility]   PATIENT NAME: Ariel Smith    MR#:  [id number]  DATE OF BIRTH:  June 25, 1945  DATE OF ADMISSION:  03/04/2017 ADMITTING PHYSICIAN: Alford Highland, MD  DATE OF DISCHARGE: 03/05/2017  PRIMARY CARE PHYSICIAN: [name], MD    ADMISSION DIAGNOSIS:  Right groin pain [R10.31] Acute cystitis with hematuria [N30.01] Altered mental status, unspecified altered mental status type [R41.82]  DISCHARGE DIAGNOSIS:  Active Problems:   Altered mental status   UTI  SECONDARY DIAGNOSIS:   Past Medical History:  Diagnosis Date  . Anemia of chronic disease   . Bronchiectasis (HCC) 08/2014    suggested by thoracic xray  . Chronic diastolic CHF (congestive heart failure) (HCC)    a. echo 10/2014: EF 60-65%, no RWMA, GR1DD; b. 03/2015 Echo: EF 60-65%, no rwma, Gr2 DD.  Marland Kitchen Chronic respiratory failure (HCC)    a. 2/2 COPD; b. on 4-5L via nasal cannula  . COPD (chronic obstructive pulmonary disease) (HCC)   . Depression   . ESRD (end stage renal disease) (HCC) 08/2011   a. on HD (TThSa), L forearm AV fistula, [name]  . Frequent headaches   . GERD (gastroesophageal reflux disease)   . GIB (gastrointestinal bleeding)    a. leading to cessation of warfarin 11/2014  . History of cardiac cath    a. 12/2014 Cath: nl cors. 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  Marland Kitchen History of colon polyps 2013   colonoscopy ([name])  . History of DVT of lower extremity 2009, 2017   a. left sided x2, with PE s/p IVC filter placement, coumadin stopped 2/2 GI bleed in 2016; b. 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  Marland Kitchen HLD (hyperlipidemia)   . HTN (hypertension)   . Morbid obesity (HCC)   . Osteoarthritis   . Osteopenia 01/2013  . Pulmonary embolism (HCC) 2009  . Secondary hyperparathyroidism of renal origin (HCC)   . Severe aortic stenosis    a. echo 10/2014: mod to sev AS; b. 02/2015 TAVR (Owen/Cooper): Edwards Sapien 3 THV (size 23mm, model  #[id number], ser# [id number]); c. 03/2015 Echo: EF 60-65%, Gr2 DD, m ild to mod AS (peak grad , mean ).    HOSPITAL COURSE:   1.  Acute encephalopathy and partially treated urinary tract infection.  Urine analysis positive.  Urine culture sent off.  Previous urine culture grew E. coli.  ER physician ordered Cipro since she was on Ceftin as outpatient.   negative MRI of the brain to rule out stroke.  Physical therapy evaluation- suggest no follow ups.  For agitation at night  start low-dose Seroquel to try to get the patient sleeping at night.  Discontinue Benadryl.   Decreased dose of xanax.   Had some concern of bradycardia on tele, seen by cardio, planning for echo and , if possible event monitor as out pt.   Pt is completely better, alert and oriented today. No complains of any kind.   D/c home with oral cipro to finish course. 2.  Episodes of bradycardia.  In reviewing her home medications she is not on any rate controlling medications.  Watch closely for sick sinus syndrome.  Cardiology evaluation appreciated. 3.  End-stage renal disease patient gets dialysis Monday Wednesday Friday. 4.  Anxiety depression.  Lowered the dose of Xanax. 5.  Left hand tremor chronic for the patient 6.  History of severe aortic stenosis status post T AVR.  On low-dose Eliquis for anticoagulation 7.  History of  diastolic congestive heart failure no signs. 8.  History of DVT/ PE on Eliquis 9.  Weakness physical therapy evaluation done, no further needs identified. 10.  Right groin pain we will get an x-ray of the pelvis- negative. 11.  Hyperlipidemia unspecified on atorvastatin 12.  Essential hypertension on benazepril 13.  Borderline troponin in a dialysis patient with no signs of chest pain or shortness of breath.  I will not order further troponins 14 since the patient is on Aricept and will send off an RPR, B12 and TSH   D/c aricept , as per cardio- can cause bradycardia.    DISCHARGE CONDITIONS:    Stable.  CONSULTS OBTAINED:  Treatment Team:  Iran Ouch, MD Mosetta Pigeon, MD  DRUG ALLERGIES:   Allergies  Allergen Reactions  . Nodolor [Isometheptene-Dichloral-Apap] Other (See Comments)    Reaction:  Headaches     DISCHARGE MEDICATIONS:   Current Discharge Medication List    START taking these medications   Details  ciprofloxacin (CIPRO) 500 MG tablet Take 1 tablet (500 mg total) daily with breakfast for 5 days by mouth. Qty: 5 tablet, Refills: 0      CONTINUE these medications which have CHANGED   Details  ALPRAZolam (XANAX) 0.25 MG tablet Take 1 tablet (0.25 mg total) daily as needed by mouth for anxiety. Qty: 15 tablet, Refills: 0      CONTINUE these medications which have NOT CHANGED   Details  albuterol (VENTOLIN HFA) 108 (90 BASE) MCG/ACT inhaler Inhale 2 puffs into the lungs every 4 (four) hours as needed for wheezing or shortness of breath. Qty: 18 g, Refills: 3    atorvastatin (LIPITOR) 40 MG tablet TAKE 1 TABLET BY MOUTH ONCE DAILY AT  6PM. NEEDS OFFICE VISIT. Qty: 90 tablet, Refills: 0    budesonide-formoterol (SYMBICORT) 160-4.5 MCG/ACT inhaler Inhale 2 puffs into the lungs 2 (two) times daily.    calcitRIOL (ROCALTROL) 0.25 MCG capsule Take 0.25 mcg 3 (three) times a week by mouth.     calcium acetate (PHOSLO) 667 MG capsule Take 667-1,334 mg 3 (three) times daily with meals by mouth. Take 2 with meals and 1 with a snack    cinacalcet (SENSIPAR) 30 MG tablet Take 30 mg by mouth daily.    clopidogrel (PLAVIX) 75 MG tablet TAKE ONE TABLET BY MOUTH ONCE DAILY WITH BREAKFAST Qty: 90 tablet, Refills: 2    diphenhydrAMINE (BENADRYL) 25 MG tablet Take 25 mg by mouth every 6 (six) hours as needed.    donepezil (ARICEPT) 5 MG tablet Take 1 tablet (5 mg total) by mouth at bedtime. Qty: 90 tablet, Refills: 3    ELIQUIS 2.5 MG TABS tablet TAKE ONE TABLET BY MOUTH TWICE DAILY Qty: 60 tablet, Refills: 6    famotidine (PEPCID) 40 MG tablet Take  1 tablet (40 mg total) by mouth every evening. Qty: 30 tablet, Refills: 5    furosemide (LASIX) 80 MG tablet Take 80 mg daily by mouth.    lidocaine-prilocaine (EMLA) cream Apply 1 application topically as needed (topical anesthesia for hemodialysis if Gebauers and Lidocaine injection are ineffective.). Qty: 30 g, Refills: 0    Multiple Vitamin (MULTIVITAMIN WITH MINERALS) TABS tablet Take 1 tablet by mouth daily.    pantoprazole (PROTONIX) 40 MG tablet TAKE ONE TABLET BY MOUTH ONCE DAILY AS NEEDED Qty: 90 tablet, Refills: 1    traZODone (DESYREL) 50 MG tablet TAKE 1 & 1/2 (ONE & ONE-HALF) TABLETS BY MOUTH AT BEDTIME Qty: 135 tablet, Refills: 1  acetaminophen (TYLENOL) 500 MG tablet Take 1,000 mg by mouth every 6 (six) hours as needed for mild pain or headache.       STOP taking these medications     cefUROXime (CEFTIN) 500 MG tablet          DISCHARGE INSTRUCTIONS:    Follow with PMD in 1-2 weeks.  If you experience worsening of your admission symptoms, develop shortness of breath, life threatening emergency, suicidal or homicidal thoughts you must seek medical attention immediately by calling 911 or calling your MD immediately  if symptoms less severe.  You Must read complete instructions/literature along with all the possible adverse reactions/side effects for all the Medicines you take and that have been prescribed to you. Take any new Medicines after you have completely understood and accept all the possible adverse reactions/side effects.   Please note  You were cared for by a hospitalist during your hospital stay. If you have any questions about your discharge medications or the care you received while you were in the hospital after you are discharged, you can call the unit and asked to speak with the hospitalist on call if the hospitalist that took care of you is not available. Once you are discharged, your primary care physician will handle any further medical issues.  Please note that NO REFILLS for any discharge medications will be authorized once you are discharged, as it is imperative that you return to your primary care physician (or establish a relationship with a primary care physician if you do not have one) for your aftercare needs so that they can reassess your need for medications and monitor your lab values.    Today   CHIEF COMPLAINT:   Chief Complaint  Patient presents with  . Altered Mental Status    HISTORY OF PRESENT ILLNESS:  Ariel Smith  is a 71 y.o. female with altered mental status since last Wednesday.  She was diagnosed with a urinary tract infection for E. coli and started on Ceftin 500 mg twice daily.  She is still been confused.  The patient stated she could not move her feet good.  Now she is doing a little bit better.  Daughter at the bedside confirmed that there were periods of time where she did not know her family and she is very scared and curled up into a ball.  Yesterday she fell out of the chair.  She is not been sleeping very well and been agitated and restless at night.  Decreased mobility and strength.  In the ER, on the heart monitor she has been having periods of bradycardia with heart rates going into the 30s and 40s.  Also having periods of time where her heart rate is in the 80s.  Again on urine analysis it does look positive.  Hospitalist services contacted for further evaluation.  Patient complains of some pain in her right groin.    VITAL SIGNS:  Blood pressure (!) 109/57, pulse 86, temperature 97.8 F (36.6 C), temperature source Oral, resp. rate 18, height 5\' 5"  (1.651 m), weight 108.4 kg (239 lb), SpO2 100 %.  I/O:    Intake/Output Summary (Last 24 hours) at 03/05/2017 1244 Last data filed at 03/05/2017 1014 Gross per 24 hour  Intake 240 ml  Output 200 ml  Net 40 ml    PHYSICAL EXAMINATION:  GENERAL:  71 y.o.-year-old patient lying in the bed with no acute distress.  EYES: Pupils equal, round,  reactive to light and accommodation. No scleral icterus.  Extraocular muscles intact.  HEENT: Head atraumatic, normocephalic. Oropharynx and nasopharynx clear.  NECK:  Supple, no jugular venous distention. No thyroid enlargement, no tenderness.  LUNGS: Normal breath sounds bilaterally, no wheezing, rales,rhonchi or crepitation. No use of accessory muscles of respiration.  CARDIOVASCULAR: S1, S2 normal. present murmurs,no rubs, or gallops.  ABDOMEN: Soft, non-tender, non-distended. Bowel sounds present. No organomegaly or mass.  EXTREMITIES: No pedal edema, cyanosis, or clubbing.  NEUROLOGIC: Cranial nerves II through XII are intact. Muscle strength 5/5 in all extremities. Sensation intact. Gait not checked.  PSYCHIATRIC: The patient is alert and oriented x 3.  SKIN: No obvious rash, lesion, or ulcer.   DATA REVIEW:   CBC Recent Labs  Lab 03/05/17 0429  WBC 5.6  HGB 10.1*  HCT 31.3*  PLT 165    Chemistries  Recent Labs  Lab 03/04/17 0715 03/05/17 0429  NA 139 139  K 3.4* 4.0  CL 102 102  CO2 25 28  GLUCOSE 160* 113*  BUN 26* 40*  CREATININE 6.37* 8.23*  CALCIUM 8.6* 8.5*  AST 19  --   ALT 10*  --   ALKPHOS 127*  --   BILITOT 0.6  --     Cardiac Enzymes Recent Labs  Lab 03/04/17 0715  TROPONINI 0.04*    Microbiology Results  Results for orders placed or performed during the hospital encounter of 03/04/17  Urine culture     Status: None   Collection Time: 03/04/17  8:44 AM  Result Value Ref Range Status   Specimen Description URINE, RANDOM  Final   Special Requests Normal  Final   Culture   Final    NO GROWTH Performed at [facility], [address], [place], [place] [address]    Report Status 03/05/2017 FINAL  Final  MRSA PCR Screening     Status: None   Collection Time: 03/04/17  4:10 PM  Result Value Ref Range Status   MRSA by PCR NEGATIVE NEGATIVE Final    Comment:        The GeneXpert MRSA Assay (FDA approved for NASAL specimens only), is  one component of a comprehensive MRSA colonization surveillance program. It is not intended to diagnose MRSA infection nor to guide or monitor treatment for MRSA infections.     RADIOLOGY:  Dg Chest 1 View  Result Date: 03/04/2017 CLINICAL DATA:  Altered mental status.  Dialysis patient. EXAM: CHEST 1 VIEW COMPARISON:  10/26/2016 FINDINGS: Cardiac enlargement. T AVR unchanged. Negative for heart failure. Elevated right hemidiaphragm with mild right lower lobe atelectasis. Left lung clear. Atherosclerotic aortic arch. IMPRESSION: Cardiac enlargement without heart failure Mild right lower lobe atelectasis. Electronically Signed   By: Marlan Palau M.D.   On: 03/04/2017 08:46   Dg Lumbar Spine 2-3 Views  Result Date: 03/04/2017 CLINICAL DATA:  Low back pain. Altered mental status since last Wednesday. EXAM: LUMBAR SPINE - 2-3 VIEW COMPARISON:  None. FINDINGS: There are 5 nonrib bearing lumbar-type vertebral bodies. There is mild L2 vertebral body height loss of indeterminate age. There is generalized osteopenia. The alignment is anatomic.  There is no spondylolysis. There is no acute fracture. There is degenerative disc disease with disc height loss at L4-5 and L5-S1. There is bilateral facet arthropathy at L4-5 and L5-S1. The SI joints are unremarkable. IMPRESSION: 1. Mild age-indeterminate L2 vertebral body compression fracture. Electronically Signed   By: Elige Ko   On: 03/04/2017 11:16   Dg Pelvis 1-2 Views  Result Date: 03/04/2017 CLINICAL DATA:  Fell from chair yesterday. EXAM: PELVIS - 1-2 VIEW COMPARISON:  None. FINDINGS: There is no evidence of pelvic fracture or diastasis. No pelvic bone lesions are seen. IMPRESSION: Negative. Electronically Signed   By: Paulina Fusi M.D.   On: 03/04/2017 11:02   Ct Head Wo Contrast  Result Date: 03/04/2017 CLINICAL DATA:  Confusion. Urinary tract infection. Altered level of consciousness. EXAM: CT HEAD WITHOUT CONTRAST TECHNIQUE: Contiguous  axial images were obtained from the base of the skull through the vertex without intravenous contrast. COMPARISON:  02/27/2017 FINDINGS: Brain: The brainstem, cerebellum, cerebral peduncles, thalami, basal ganglia, basilar cisterns, and ventricular system appear within normal limits. Periventricular white matter and corona radiata hypodensities favor chronic ischemic microvascular white matter disease. No intracranial hemorrhage, mass lesion, or acute CVA. Vascular: There is atherosclerotic calcification of the cavernous carotid arteries bilaterally. Skull: Unremarkable Sinuses/Orbits: Unremarkable Other: No supplemental non-categorized findings. IMPRESSION: 1. No acute intracranial findings. 2. Periventricular white matter and corona radiata hypodensities favor chronic ischemic microvascular white matter disease. 3. Atherosclerosis. Electronically Signed   By: Gaylyn Rong M.D.   On: 03/04/2017 07:55   Mr Brain Wo Contrast  Result Date: 03/04/2017 CLINICAL DATA:  71 year old female with altered mental status for 1 week, a diagnosed with UTI and started on treatment but mental status has not improved. Agitation. EXAM: MRI HEAD WITHOUT CONTRAST TECHNIQUE: Multiplanar, multiecho pulse sequences of the brain and surrounding structures were obtained without intravenous contrast. COMPARISON:  Head CT without contrast 0738 hours today and 02/27/2017. FINDINGS: Brain: No restricted diffusion to suggest acute infarction. No midline shift, mass effect, evidence of mass lesion, ventriculomegaly, extra-axial collection or acute intracranial hemorrhage. Cervicomedullary junction and pituitary are within normal limits. Small area of chronic encephalomalacia in the right posterior parietal lobe cortex. Elsewhere mild to moderate for age nonspecific cerebral white matter T2 and FLAIR hyperintensity, in part periventricular. No chronic cerebral blood products are evident. No other cortical encephalomalacia. Deep gray  matter nuclei, brainstem and cerebellum appear normal. Vascular: Major intracranial vascular flow voids are preserved. Skull and upper cervical spine: Negative. Sinuses/Orbits: Normal orbits soft tissues. Visualized paranasal sinuses and mastoids are stable and well pneumatized. Other: Incidental right posterior neck dermis and subcutaneous associated cyst (likely benign sebaceous cyst series 2, image 7). Scalp and face soft tissues appear negative. IMPRESSION: 1.  No acute intracranial abnormality. 2. 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 Mild to moderate for age nonspecific cerebral white matter signal changes may reflect chronic small vessel disease. Electronically Signed   By: Odessa Fleming M.D.   On: 03/04/2017 15:52    EKG:   Orders placed or performed during the hospital encounter of 03/04/17  . EKG 12-Lead  . EKG 12-Lead  . EKG 12-Lead  . EKG 12-Lead      Management plans discussed with the patient, family and they are in agreement.  CODE STATUS:     Code Status Orders  (From admission, onward)        Start     Ordered   03/04/17 1027  Full code  Continuous     03/04/17 1026    Code Status History    Date Active Date Inactive Code Status Order ID Comments User Context   07/09/2016 09:48 07/09/2016 13:30 Full Code [id number]  [name], MD Inpatient   10/21/2015 21:54 10/25/2015 20:14 Full Code [id number]  Gracelyn Nurse, MD Inpatient   02/19/2015 10:26 02/22/2015 12:22 Full Code [id number]  Purcell Nails, MD Inpatient  01/18/2015 13:54 01/18/2015 21:58 Full Code [id number]  Tonny Bollmanooper, Michael, MD Inpatient   12/28/2014 16:11 12/31/2014 14:46 Full Code [id number]  Houston SirenSainani, Vivek J, MD ED   12/21/2014 01:35 12/22/2014 19:21 Full Code [id number]  Oralia ManisWillis, David, MD Inpatient   12/11/2014 14:18 12/14/2014 20:10 DNR [id number]  Crissie Figureseddy, Edavally N, MD Inpatient   11/12/2014 12:51 11/22/2014 21:33 DNR [id number]  Alford HighlandWieting, Richard, MD Inpatient   11/09/2014 14:31 11/12/2014 12:51 Full Code  [id number]  Ramonita LabGouru, Aruna, MD Inpatient    Advance Directive Documentation     Most Recent Value  Type of Advance Directive  Healthcare Power of Attorney  Pre-existing out of facility DNR order (yellow form or pink MOST form)  No data  "MOST" Form in Place?  No data      TOTAL TIME TAKING CARE OF THIS PATIENT: 35 minutes.    Altamese DillingVaibhavkumar Boots Mcglown M.D on 03/05/2017 at 12:44 PM  Between 7am to 6pm - Pager - [phone]  After 6pm go to [email or web address] - password [email or web address]  [facility]  Office  [phone]  CC: Primary care physician; Eustaquio BoydenGutierrez, Javier, MD   Note: This dictation was prepared with Dragon dictation along with smaller phrase technology. Any transcriptional errors that result from this process are unintentional.

## 2017-03-05 NOTE — Consult Note (Signed)
Cardiology Consult    Patient ID: TABBY SIRLES MRN: [id number], DOB/AGE: 1946/01/13   Admit date: 03/04/2017 Date of Consult: 03/05/2017  Primary Physician: [name], MD Primary Cardiologist: Concha Se, MD  Requesting Provider: Ozella Rocks, MD  Patient Profile    Ariel Smith is a 71 y.o. female with a history of recurrent DVT status post IVC filter and on Eliquis, remote PE, HFrEF, severe aortic stenosis status post TAVR in 2016, chronic respiratory failure/COPD, depression, end-stage renal disease, hypertension, and hyperlipidemia, who is being seen today for the evaluation of bradycardia at the request of Dr. Elisabeth Pigeon.  Past Medical History   Past Medical History:  Diagnosis Date  . Anemia of chronic disease   . Bronchiectasis (HCC) 08/2014    suggested by thoracic xray  . Chronic diastolic CHF (congestive heart failure) (HCC)    a. echo 10/2014: EF 60-65%, no RWMA, GR1DD; b. 03/2015 Echo: EF 60-65%, no rwma, Gr2 DD.  Marland Kitchen Chronic respiratory failure (HCC)    a. 2/2 COPD; b. on 4-5L via nasal cannula  . COPD (chronic obstructive pulmonary disease) (HCC)   . Depression   . ESRD (end stage renal disease) (HCC) 08/2011   a. on HD (TThSa), L forearm AV fistula, [name]  . Frequent headaches   . GERD (gastroesophageal reflux disease)   . GIB (gastrointestinal bleeding)    a. leading to cessation of warfarin 11/2014  . History of cardiac cath    a. 12/2014 Cath: nl cors. 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  Marland Kitchen History of colon polyps 2013   colonoscopy ([name])  . History of DVT of lower extremity 2009, 2017   a. left sided x2, with PE s/p IVC filter placement, coumadin stopped 2/2 GI bleed in 2016; b. 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  Marland Kitchen HLD (hyperlipidemia)   . HTN (hypertension)   . Morbid obesity (HCC)   . Osteoarthritis   . Osteopenia 01/2013  . Pulmonary embolism (HCC) 2009  . Secondary hyperparathyroidism of renal origin (HCC)   . Severe  aortic stenosis    a. echo 10/2014: mod to sev AS; b. 02/2015 TAVR (Owen/Cooper): Edwards Sapien 3 THV (size 66mm, model #9600TFX, ser# [id number]); c. 03/2015 Echo: EF 60-65%, Gr2 DD, m ild to mod AS (peak grad , mean ).    Past Surgical History:  Procedure Laterality Date  . A/V Fistulagram Left 07/09/2016   Performed by [name], MD at [facility] INVASIVE CV LAB  . BUNIONECTOMY Left 2003  . CHOLECYSTECTOMY  2012  . COLONOSCOPY  08/2011   colon biopsies, [name]  . ESOPHAGOGASTRODUODENOSCOPY  08/2011   gastric cardia polyp  . ESOPHAGOGASTRODUODENOSCOPY (EGD) Left 11/11/2014   Performed by [name], MD at [facility] ENDOSCOPY  . EXTERIORIZATION OF A CONTINUOUS AMBULATORY PERITONEAL DIALYSIS CATHETER  01/2013   removal 12/2103 - [name]  . hospitalization  12/2013   recurrent R pleural effusion due to peritoneal fluid translocation s/p rpt thoracentesis with 1.3 L fluid removed, ERSD started on HD this hospitalization  . IVC Filter Insertion N/A 11/12/2014   Performed by [name], MD at [facility] INVASIVE CV LAB  . Lower Extremity Venography with intervention (thormbolysis/thrombectomy) Left 10/24/2015   Performed by [name], MD at [facility] INVASIVE CV LAB  . REPLACEMENT TOTAL KNEE Left 2006  . Right/Left Heart Cath and Coronary Angiography N/A 01/18/2015   Performed by Tonny Bollman, MD at [facility] INVASIVE CV LAB  . SHOULDER ARTHROSCOPY Right  2009  . TONSILLECTOMY  1955  . TRANSCATHETER AORTIC VALVE REPLACEMENT, TRANSFEMORAL Right 02/19/2015   [facility]formed [facility] Ton[facility] Bollmanooper, Michael[facility]  . TRANSESOPHAGEAL ECHOCARDIOGRAM (TEE) N/A 02/19/2015   [facility]formed [facility]manooper, Michael[facility]  . TUBAL LIGATION  1980  . US ECHOCARDIOGRAPHY  12/2013   EF 55-60%, nl LV sys fxn, mild-mod MR, AS, increased LV posterior wall thickness, mild TR     Allergies  Allergies  Allergen Reactions  . Nodolor [Isometheptene-Dichloral-Apap] Other (See Comments)    Reaction:  Headaches     History of Present  Illness    71 year old female with the above complex past medical history including recurrent DVT, initially in 2009.  She was on warfarin but does suffered a GI bleed and required IVC filter placement.  She had a recurrent DVT in 2017 and has since been on Eliquis.  Other history includes remote pulmonary embolism, severe aortic stenosis status post TAVR, HFrEF, end-stage renal disease, hypertension, hyperlipidemia, and obesity.  She was in her usual state of health until approximately 8 days prior to admission, when following dialysis last Wednesday, her daughter noted altered mental status.  This persisted over the subsequent days and patient was seen in the emergency department this past Saturday.  She was diagnosed with a urinary tract infection and discharged home on Ceftin.  She had some improvement in mental status the following day but since earlier this week, she has had progressive confusion and disorientation.  As result, she was taken back to the emergency department on November 15.  Urinalysis continues to show urinary tract infection.  She was also noted to have intermittent bradycardia while in the emergency department with rates dropping into the 30s.  She was hemodynamically stable and asymptomatic during these episodes.   Patient was admitted for further evaluation.  She is much more lucid this morning per family and is oriented on examination.  Patient currently has no complaints.  No recent chest pain, dyspnea, though she does report intermittent dizziness as an outpatient.  No syncope.  Review of telemetry does show intermittent bradycardia without any evidence of high-grade heart block.  Inpatient Medications    . apixaban  2.5 mg Oral BID  . atorvastatin  40 mg Oral q1800  . calcitRIOL  0.25 mcg Oral Once per day on Mon Wed Fri  . calcium acetate  1,334 mg Oral TID WC  . cinacalcet  30 mg Oral Q breakfast  . clopidogrel  75 mg Oral Daily  . famotidine  40 mg Oral QPM  .  furosemide  80 mg Oral Daily  . mouth rinse  15 mL Mouth Rinse BID  . mometasone-formoterol  2 puff Inhalation BID  . multivitamin with minerals  1 tablet Oral Daily  . pantoprazole  40 mg Oral Daily  . QUEtiapine  12.5 mg Oral QHS    Family History    Family History  Problem Relation Age of Onset  . Deep vein thrombosis Brother   . Cancer Mother 380       colon  . Stroke Mother   . Cancer Father        prostate  . Hypertension Father   . Dementia Father 2277  . Hypertension Brother   . Breast cancer Daughter 2042  . Diabetes Neg Hx   . CAD Neg Hx     Social History    Social History   Socioeconomic History  . Marital status: Widowed    Spouse  name: Not on file  . Number of children: Not on file  . Years of education: Not on file  . Highest education level: Not on file  Social Needs  . Financial resource strain: Not on file  . Food insecurity - worry: Not on file  . Food insecurity - inability: Not on file  . Transportation needs - medical: Not on file  . Transportation needs - non-medical: Not on file  Occupational History  . Not on file  Tobacco Use  . Smoking status: Former Smoker    Packs/day: 2.00    Years: 30.00    Pack years: 60.00    Types: Cigarettes    Start date: 04/20/1978    Last attempt to quit: 04/21/2007    Years since quitting: 9.8  . Smokeless tobacco: Never Used  Substance and Sexual Activity  . Alcohol use: No  . Drug use: No  . Sexual activity: No  Other Topics Concern  . Not on file  Social History Narrative   Activity: no regular exercise   Diet: limits 32 oz/day due to dialysis, some fruits/vegetables     Review of Systems    General:  No chills, fever, night sweats or weight changes.  Cardiovascular:  No chest pain, dyspnea on exertion, edema, orthopnea, palpitations, paroxysmal nocturnal dyspnea.  Occasional lightheadedness. Dermatological: No rash, lesions/masses Respiratory: No cough, dyspnea Urologic: No hematuria,  dysuria Abdominal:   No nausea, vomiting, diarrhea, bright red blood per rectum, melena, or hematemesis Neurologic: Altered mental status as outlined in the history of present illness.  No visual changes, wkns. All other systems reviewed and are otherwise negative except as noted above.  Physical Exam    Blood pressure (!) 109/57, pulse 86, temperature 97.8 F (36.6 C), temperature source Oral, resp. rate 18, height 5\' 5"  (1.651 m), weight 239 lb (108.4 kg), SpO2 100 %.  General: Pleasant, NAD Psych: Normal affect. Neuro: Alert and oriented X 3. Moves all extremities spontaneously. HEENT: Normal  Neck: Supple without bruits or JVD. Lungs:  Resp regular and unlabored, CTA. Heart: RRR no s3, s4, 2/6 systolic ejection murmur at the right upper sternal border. Abdomen: Soft, non-tender, non-distended, BS + x 4.  Extremities: No clubbing, cyanosis or edema. DP/PT/Radials 2+ and equal bilaterally.  Labs     Recent Labs    03/04/17 0715  TROPONINI 0.04*   Lab Results  Component Value Date   WBC 5.6 03/05/2017   HGB 10.1 (L) 03/05/2017   HCT 31.3 (L) 03/05/2017   MCV 103.5 (H) 03/05/2017   PLT 165 03/05/2017    Recent Labs  Lab 03/04/17 0715 03/05/17 0429  NA 139 139  K 3.4* 4.0  CL 102 102  CO2 25 28  BUN 26* 40*  CREATININE 6.37* 8.23*  CALCIUM 8.6* 8.5*  PROT 7.2  --   BILITOT 0.6  --   ALKPHOS 127*  --   ALT 10*  --   AST 19  --   GLUCOSE 160* 113*     Radiology Studies    Dg Chest 1 View  Result Date: 03/04/2017 CLINICAL DATA:  Altered mental status.  Dialysis patient. EXAM: CHEST 1 VIEW COMPARISON:  10/26/2016 FINDINGS: Cardiac enlargement. T AVR unchanged. Negative for heart failure. Elevated right hemidiaphragm with mild right lower lobe atelectasis. Left lung clear. Atherosclerotic aortic arch. IMPRESSION: Cardiac enlargement without heart failure Mild right lower lobe atelectasis. Electronically Signed   By: Marlan Palau M.D.   On: 03/04/2017 08:46  Dg Lumbar Spine 2-3 Views  Result Date: 03/04/2017 CLINICAL DATA:  Low back pain. Altered mental status since last Wednesday. EXAM: LUMBAR SPINE - 2-3 VIEW COMPARISON:  None. FINDINGS: There are 5 nonrib bearing lumbar-type vertebral bodies. There is mild L2 vertebral body height loss of indeterminate age. There is generalized osteopenia. The alignment is anatomic.  There is no spondylolysis. There is no acute fracture. There is degenerative disc disease with disc height loss at L4-5 and L5-S1. There is bilateral facet arthropathy at L4-5 and L5-S1. The SI joints are unremarkable. IMPRESSION: 1. Mild age-indeterminate L2 vertebral body compression fracture. Electronically Signed   By: Elige Ko   On: 03/04/2017 11:16   Dg Pelvis 1-2 Views  Result Date: 03/04/2017 CLINICAL DATA:  Larey Seat from chair yesterday. EXAM: PELVIS - 1-2 VIEW COMPARISON:  None. FINDINGS: There is no evidence of pelvic fracture or diastasis. No pelvic bone lesions are seen. IMPRESSION: Negative. Electronically Signed   By: Paulina Fusi M.D.   On: 03/04/2017 11:02   Ct Head Wo Contrast  Result Date: 03/04/2017 CLINICAL DATA:  Confusion. Urinary tract infection. Altered level of consciousness. EXAM: CT HEAD WITHOUT CONTRAST TECHNIQUE: Contiguous axial images were obtained from the base of the skull through the vertex without intravenous contrast. COMPARISON:  02/27/2017 FINDINGS: Brain: The brainstem, cerebellum, cerebral peduncles, thalami, basal ganglia, basilar cisterns, and ventricular system appear within normal limits. Periventricular white matter and corona radiata hypodensities favor chronic ischemic microvascular white matter disease. No intracranial hemorrhage, mass lesion, or acute CVA. Vascular: There is atherosclerotic calcification of the cavernous carotid arteries bilaterally. Skull: Unremarkable Sinuses/Orbits: Unremarkable Other: No supplemental non-categorized findings. IMPRESSION: 1. No acute intracranial  findings. 2. Periventricular white matter and corona radiata hypodensities favor chronic ischemic microvascular white matter disease. 3. Atherosclerosis. Electronically Signed   By: Gaylyn Rong M.D.   On: 03/04/2017 07:55   Ct Head Wo Contrast  Result Date: 02/27/2017 CLINICAL DATA:  Altered mental status for 3 days. EXAM: CT HEAD WITHOUT CONTRAST TECHNIQUE: Contiguous axial images were obtained from the base of the skull through the vertex without intravenous contrast. COMPARISON:  11/21/2013 FINDINGS: Brain: Patchy chronic ischemic changes in the periventricular white matter are not significantly changed. Mild global atrophy. No mass effect, midline shift, or acute hemorrhage. Vascular: No hyperdense vessel or unexpected calcification. Skull: Cranium is intact. Sinuses/Orbits: Mastoid air cells and visualized paranasal sinuses are clear. Orbits are within normal limits. Other: Noncontributory. IMPRESSION: No acute intracranial pathology.  Chronic changes are noted. Electronically Signed   By: Jolaine Click M.D.   On: 02/27/2017 10:45   Mr Brain Wo Contrast  Result Date: 03/04/2017 CLINICAL DATA:  71 year old female with altered mental status for 1 week, a diagnosed with UTI and started on treatment but mental status has not improved. Agitation. EXAM: MRI HEAD WITHOUT CONTRAST TECHNIQUE: Multiplanar, multiecho pulse sequences of the brain and surrounding structures were obtained without intravenous contrast. COMPARISON:  Head CT without contrast 0738 hours today and 02/27/2017. FINDINGS: Brain: No restricted diffusion to suggest acute infarction. No midline shift, mass effect, evidence of mass lesion, ventriculomegaly, extra-axial collection or acute intracranial hemorrhage. Cervicomedullary junction and pituitary are within normal limits. Small area of chronic encephalomalacia in the right posterior parietal lobe cortex. Elsewhere mild to moderate for age nonspecific cerebral white matter T2 and  FLAIR hyperintensity, in part periventricular. No chronic cerebral blood products are evident. No other cortical encephalomalacia. Deep gray matter nuclei, brainstem and cerebellum appear normal. Vascular: Major intracranial vascular flow  voids are preserved. Skull and upper cervical spine: Negative. Sinuses/Orbits: Normal orbits soft tissues. Visualized paranasal sinuses and mastoids are stable and well pneumatized. Other: Incidental right posterior neck dermis and subcutaneous associated cyst (likely benign sebaceous cyst series 2, image 7). Scalp and face soft tissues appear negative. IMPRESSION: 1.  No acute intracranial abnormality. 2. 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 Mild to moderate for age nonspecific cerebral white matter signal changes may reflect chronic small vessel disease. Electronically Signed   By: Odessa Fleming M.D.   On: 03/04/2017 15:52    ECG & Cardiac Imaging    Sinus rhythm, 66, intermittent pauses of less than 2 seconds with possible blocked PACs.  Review of telemetry shows sinus rhythm with sinus bradycardia.  Question blocked PACs.  No pauses of over 2 seconds.  Assessment & Plan    1.  Altered mental status/urinary tract infection: Mental status clearing with antibiotic therapy.  Per internal medicine.  Next  2.  Sinus bradycardia: Incidentally noted while in the emergency department and intermittent overnight on telemetry.  Patient is asymptomatic and hemodynamically stable.  She does report intermittent lightheadedness at home.  She is status post TAVR but it is notable that she did not have any problems with heart block post procedure.  She is not on any AV nodal blocking agents but is taking Aricept, which may contribute to bradycardia.  We have discontinued this.  Follow-up echocardiogram and arrange for event monitoring if no significant heart block noted during hospitalization.  3.  Aortic stenosis status post TAVR: Last echo in December 2016 did show  mild elevation of velocities across the valve.  Follow-up.  4.  HFrEF: Volume stable.  5.  End-stage renal disease: Hemodialysis per nephrology.  6.  Hypertension: Stable.  7.  Elevated troponin: History of normal coronary arteries by catheterization in 2016.  No recent chest pain or dyspnea.  Likely nonspecific.  Follow-up echo.  8.  History of DVT: On chronic low-dose Eliquis.  Signed, Nicolasa Ducking, NP 03/05/2017, 12:23 PM  For questions or updates, please contact   Please consult [email or web address] for contact info under Cardiology/STEMI.

## 2017-03-05 NOTE — Evaluation (Signed)
Physical Therapy Evaluation Patient Details Name: Ariel Smith MRN: [id number] DOB: 05/29/1945 Today's Date: 03/05/2017   History of Present Illness  71 y/o female here with AMS, UTI.  Feeling better and back near baseline.  Clinical Impression  Pt did well with PT exam and showed good tolerance for prolonged ambulation (~350 ft with walker).  She did need some gait training/cuing apart from the PT exam, but ultimately did well and showed ability to safely go home with baseline assist from daughter/grandson whom she lives with.  Pt's vitals remained stable w/o the effort and she showed functional strength and mobility with no overt safety concerns.     Follow Up Recommendations No PT follow up    Equipment Recommendations       Recommendations for Other Services       Precautions / Restrictions Precautions Precautions: Fall Restrictions Weight Bearing Restrictions: No      Mobility  Bed Mobility Overal bed mobility: Independent             General bed mobility comments: Pt able to get up to EOB w/o assist  Transfers Overall transfer level: Modified independent Equipment used: Rolling walker (2 wheeled)             General transfer comment: Pt able to rise to standing w/o issues, showed good safety and confidence  Ambulation/Gait Ambulation/Gait assistance: Modified independent (Device/Increase time) Ambulation Distance (Feet): 350 Feet Assistive device: Rolling walker (2 wheeled)       General Gait Details: Pt with consistent, safe and relatively confident ambulation.  She was able to do some ambulation with only single UE use (hallway rail) and her vitals remained stable with the effort.  Pt showed good confidence and safety. Pt did require some gait traingin/cuing for steps lenght, cadence as well as appropriate use of walker but ultimately did well.  Stairs            Wheelchair Mobility    Modified Rankin (Stroke Patients Only)        Balance Overall balance assessment: Modified Independent                                           Pertinent Vitals/Pain Pain Assessment: No/denies pain    Home Living Family/patient expects to be discharged to:: Private residence Living Arrangements: Children;Other relatives     Home Access: Ramped entrance     Home Layout: One level Home Equipment: Walker - 2 wheels;Cane - single point      Prior Function Level of Independence: Independent with assistive device(s)         Comments: Pt uses SPC and is out of the house nearly daily (dialysis, errands with daughter, etc)     Hand Dominance        Extremity/Trunk Assessment   Upper Extremity Assessment Upper Extremity Assessment: Overall WFL for tasks assessed;Generalized weakness    Lower Extremity Assessment Lower Extremity Assessment: Overall WFL for tasks assessed;Generalized weakness       Communication   Communication: No difficulties  Cognition Arousal/Alertness: Awake/alert Behavior During Therapy: WFL for tasks assessed/performed Overall Cognitive Status: Within Functional Limits for tasks assessed                                 General Comments: Per daughter she is essentially back  to baseline      General Comments      Exercises     Assessment/Plan    PT Assessment Patent does not need any further PT services  PT Problem List         PT Treatment Interventions      PT Goals (Current goals can be found in the Care Plan section)  Acute Rehab PT Goals Patient Stated Goal: go home PT Goal Formulation: All assessment and education complete, DC therapy    Frequency     Barriers to discharge        Co-evaluation               AM-PAC PT "6 Clicks" Daily Activity  Outcome Measure Difficulty turning over in bed (including adjusting bedclothes, sheets and blankets)?: None Difficulty moving from lying on back to sitting on the side of the bed?  : None Difficulty sitting down on and standing up from a chair with arms (e.g., wheelchair, bedside commode, etc,.)?: None Help needed moving to and from a bed to chair (including a wheelchair)?: None Help needed walking in hospital room?: None Help needed climbing 3-5 steps with a railing? : None 6 Click Score: 24    End of Session Equipment Utilized During Treatment: Gait belt Activity Tolerance: Patient tolerated treatment well Patient left: with chair alarm set;with call bell/phone within reach;with family/visitor present   PT Visit Diagnosis: Difficulty in walking, not elsewhere classified (R26.2);Muscle weakness (generalized) (M62.81)    Time: 2671-2458 PT Time Calculation (min) (ACUTE ONLY): 32 min   Charges:   PT Evaluation $PT Eval Low Complexity: 1 Low PT Treatments $Gait Training: 8-22 mins   PT G Codes:   PT G-Codes **NOT FOR INPATIENT CLASS** Functional Assessment Tool Used: AM-PAC 6 Clicks Basic Mobility Functional Limitation: Mobility: Walking and moving around Mobility: Walking and Moving Around Current Status (K9983): 0 percent impaired, limited or restricted Mobility: Walking and Moving Around Goal Status (J8250): 0 percent impaired, limited or restricted Mobility: Walking and Moving Around Discharge Status (N3976): 0 percent impaired, limited or restricted    Malachi Pro, DPT 03/05/2017, 11:54 AM

## 2017-03-05 NOTE — Progress Notes (Signed)
Pt completed dialysis and is to be discharged this evening. Iv and tele removed. Awaiting daughter's arrival in order to reviewdischarge instructions with both of them. Daughter to transport.

## 2017-03-05 NOTE — Care Management Obs Status (Signed)
MEDICARE OBSERVATION STATUS NOTIFICATION   Patient Details  Name: Ariel Smith MRN: [id number] Date of Birth: [birth year]   Medicare Observation Status Notification Given:  Yes Notice signed, one given to patient and the other to HIM for scanning    Eber Hong, RN 03/05/2017, 1:58 PM

## 2017-03-05 NOTE — Progress Notes (Signed)
[facility] [place], [place] 03/05/17  Subjective:  Patient known to our practice from outpatient dialysis.  She states that on Wednesday, she was confused at dialysis.  She could not recognize her daughter.  She was getting treatment for E. coli UTI.  She was admitted for altered mental status.  She has received IV Cipro.  Treatment of UTI has apparently resulted in improvement of mental status This morning, she feels like she is back to baseline.  She is able to recognize her family.  She is alert and oriented and is able to answer questions about her health. Nephrology consult was requested for evaluation of dialysis while inpatient   Objective:  Vital signs in last 24 hours:  Temp:  [97.8 F (36.6 C)-98.6 F (37 C)] 97.8 F (36.6 C) (11/16 0817) Pulse Rate:  [76-88] 86 (11/16 0817) Resp:  [18] 18 (11/16 0402) BP: (109-135)/(47-57) 109/57 (11/16 0817) SpO2:  [98 %-100 %] 100 % (11/16 0817)  Weight change:  Filed Weights   03/04/17 0651  Weight: 108.4 kg (239 lb)    Intake/Output:    Intake/Output Summary (Last 24 hours) at 03/05/2017 1336 Last data filed at 03/05/2017 1014 Gross per 24 hour  Intake 240 ml  Output 200 ml  Net 40 ml     Physical Exam: General:  No acute distress, sitting up in the chair  HEENT  anicteric, moist oral mucous membranes  Neck  supple  Pulm/lungs  normal breathing effort, clear to auscultation, nasal cannula oxygen supplementation  CVS/Heart  regular, no rub or gallop, prominent systolic murmur,  Abdomen:   Soft, nontender  Extremities:  No pitting edema  Neurologic:  Alert, oriented  Skin:  No acute rashes  Access:  Left forearm AV fistula       Basic Metabolic Panel:  Recent Labs  Lab 02/27/17 1050 03/04/17 0715 03/05/17 0429  NA 141 139 139  K 3.4* 3.4* 4.0  CL 101 102 102  CO2 28 25 28   GLUCOSE 107* 160* 113*  BUN 37* 26* 40*  CREATININE 7.36* 6.37* 8.23*  CALCIUM 8.9 8.6* 8.5*     CBC: Recent  Labs  Lab 02/27/17 1050 03/04/17 0715 03/05/17 0429  WBC 6.2 7.3 5.6  NEUTROABS 4.6 5.7  --   HGB 10.4* 11.2* 10.1*  HCT 31.6* 34.1* 31.3*  MCV 103.2* 102.8* 103.5*  PLT 199 181 165      Lab Results  Component Value Date   HEPBSAG Negative 10/25/2015      Microbiology:  Recent Results (from the past 240 hour(s))  Urine Culture     Status: Abnormal   Collection Time: 02/27/17 11:57 AM  Result Value Ref Range Status   Specimen Description   Final    URINE, RANDOM Performed at [facility], [address] [place], [place] [address]    Special Requests NONE  Final   Culture >=100,000 COLONIES/mL ESCHERICHIA COLI (A)  Final   Report Status 03/01/2017 FINAL  Final   Organism ID, Bacteria ESCHERICHIA COLI (A)  Final      Susceptibility   Escherichia coli - MIC*    AMPICILLIN >=32 RESISTANT Resistant     CEFAZOLIN <=4 SENSITIVE Sensitive     CEFTRIAXONE <=1 SENSITIVE Sensitive     CIPROFLOXACIN <=0.25 SENSITIVE Sensitive     GENTAMICIN <=1 SENSITIVE Sensitive     IMIPENEM <=0.25 SENSITIVE Sensitive     NITROFURANTOIN <=16 SENSITIVE Sensitive     TRIMETH/SULFA <=20 SENSITIVE Sensitive  AMPICILLIN/SULBACTAM 16 INTERMEDIATE Intermediate     PIP/TAZO <=4 SENSITIVE Sensitive     Extended ESBL NEGATIVE Sensitive     * >=100,000 COLONIES/mL ESCHERICHIA COLI  Urine culture     Status: None   Collection Time: 03/04/17  8:44 AM  Result Value Ref Range Status   Specimen Description URINE, RANDOM  Final   Special Requests Normal  Final   Culture   Final    NO GROWTH Performed at [facility], [address] [place], [place] [address]    Report Status 03/05/2017 FINAL  Final  MRSA PCR Screening     Status: None   Collection Time: 03/04/17  4:10 PM  Result Value Ref Range Status   MRSA by PCR NEGATIVE NEGATIVE Final    Comment:        The GeneXpert MRSA Assay (FDA approved for NASAL specimens only), is one component of a comprehensive MRSA  colonization surveillance program. It is not intended to diagnose MRSA infection nor to guide or monitor treatment for MRSA infections.     Coagulation Studies: No results for input(s): LABPROT, INR in the last 72 hours.  Urinalysis: Recent Labs    03/04/17 0715  COLORURINE YELLOW*  LABSPEC 1.013  PHURINE 6.0  GLUCOSEU NEGATIVE  HGBUR MODERATE*  BILIRUBINUR NEGATIVE  KETONESUR NEGATIVE  PROTEINUR 100*  NITRITE NEGATIVE  LEUKOCYTESUR TRACE*      Imaging: Dg Chest 1 View  Result Date: 03/04/2017 CLINICAL DATA:  Altered mental status.  Dialysis patient. EXAM: CHEST 1 VIEW COMPARISON:  10/26/2016 FINDINGS: Cardiac enlargement. T AVR unchanged. Negative for heart failure. Elevated right hemidiaphragm with mild right lower lobe atelectasis. Left lung clear. Atherosclerotic aortic arch. IMPRESSION: Cardiac enlargement without heart failure Mild right lower lobe atelectasis. Electronically Signed   By: Marlan Palau M.D.   On: 03/04/2017 08:46   Dg Lumbar Spine 2-3 Views  Result Date: 03/04/2017 CLINICAL DATA:  Low back pain. Altered mental status since last Wednesday. EXAM: LUMBAR SPINE - 2-3 VIEW COMPARISON:  None. FINDINGS: There are 5 nonrib bearing lumbar-type vertebral bodies. There is mild L2 vertebral body height loss of indeterminate age. There is generalized osteopenia. The alignment is anatomic.  There is no spondylolysis. There is no acute fracture. There is degenerative disc disease with disc height loss at L4-5 and L5-S1. There is bilateral facet arthropathy at L4-5 and L5-S1. The SI joints are unremarkable. IMPRESSION: 1. Mild age-indeterminate L2 vertebral body compression fracture. Electronically Signed   By: Elige Ko   On: 03/04/2017 11:16   Dg Pelvis 1-2 Views  Result Date: 03/04/2017 CLINICAL DATA:  Larey Seat from chair yesterday. EXAM: PELVIS - 1-2 VIEW COMPARISON:  None. FINDINGS: There is no evidence of pelvic fracture or diastasis. No pelvic bone lesions are  seen. IMPRESSION: Negative. Electronically Signed   By: Paulina Fusi M.D.   On: 03/04/2017 11:02   Ct Head Wo Contrast  Result Date: 03/04/2017 CLINICAL DATA:  Confusion. Urinary tract infection. Altered level of consciousness. EXAM: CT HEAD WITHOUT CONTRAST TECHNIQUE: Contiguous axial images were obtained from the base of the skull through the vertex without intravenous contrast. COMPARISON:  02/27/2017 FINDINGS: Brain: The brainstem, cerebellum, cerebral peduncles, thalami, basal ganglia, basilar cisterns, and ventricular system appear within normal limits. Periventricular white matter and corona radiata hypodensities favor chronic ischemic microvascular white matter disease. No intracranial hemorrhage, mass lesion, or acute CVA. Vascular: There is atherosclerotic calcification of the cavernous carotid arteries bilaterally. Skull: Unremarkable Sinuses/Orbits: Unremarkable Other: No supplemental  non-categorized findings. IMPRESSION: 1. No acute intracranial findings. 2. Periventricular white matter and corona radiata hypodensities favor chronic ischemic microvascular white matter disease. 3. Atherosclerosis. Electronically Signed   By: Gaylyn Rong M.D.   On: 03/04/2017 07:55   Mr Brain Wo Contrast  Result Date: 03/04/2017 CLINICAL DATA:  71 year old female with altered mental status for 1 week, a diagnosed with UTI and started on treatment but mental status has not improved. Agitation. EXAM: MRI HEAD WITHOUT CONTRAST TECHNIQUE: Multiplanar, multiecho pulse sequences of the brain and surrounding structures were obtained without intravenous contrast. COMPARISON:  Head CT without contrast 0738 hours today and 02/27/2017. FINDINGS: Brain: No restricted diffusion to suggest acute infarction. No midline shift, mass effect, evidence of mass lesion, ventriculomegaly, extra-axial collection or acute intracranial hemorrhage. Cervicomedullary junction and pituitary are within normal limits. Small area of  chronic encephalomalacia in the right posterior parietal lobe cortex. Elsewhere mild to moderate for age nonspecific cerebral white matter T2 and FLAIR hyperintensity, in part periventricular. No chronic cerebral blood products are evident. No other cortical encephalomalacia. Deep gray matter nuclei, brainstem and cerebellum appear normal. Vascular: Major intracranial vascular flow voids are preserved. Skull and upper cervical spine: Negative. Sinuses/Orbits: Normal orbits soft tissues. Visualized paranasal sinuses and mastoids are stable and well pneumatized. Other: Incidental right posterior neck dermis and subcutaneous associated cyst (likely benign sebaceous cyst series 2, image 7). Scalp and face soft tissues appear negative. IMPRESSION: 1.  No acute intracranial abnormality. 2. 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 Mild to moderate for age nonspecific cerebral white matter signal changes may reflect chronic small vessel disease. Electronically Signed   By: Odessa Fleming M.D.   On: 03/04/2017 15:52     Medications:   . ciprofloxacin 400 mg (03/05/17 1027)   . apixaban  2.5 mg Oral BID  . atorvastatin  40 mg Oral q1800  . calcitRIOL  0.25 mcg Oral Once per day on Mon Wed Fri  . calcium acetate  1,334 mg Oral TID WC  . cinacalcet  30 mg Oral Q breakfast  . clopidogrel  75 mg Oral Daily  . famotidine  40 mg Oral QPM  . furosemide  80 mg Oral Daily  . mouth rinse  15 mL Mouth Rinse BID  . mometasone-formoterol  2 puff Inhalation BID  . multivitamin with minerals  1 tablet Oral Daily  . pantoprazole  40 mg Oral Daily  . QUEtiapine  12.5 mg Oral QHS   acetaminophen **OR** acetaminophen, albuterol, ALPRAZolam, calcium acetate, lidocaine-prilocaine, LORazepam  Assessment/ Plan:  71 y.o. Caucasian female with chronic diastolic congestive heart failure, COPD requiring oxygen supplementation by nasal cannula, end-stage renal disease, GERD, history of GI bleed, history of DVT in the leg,  hypertension, history of transcatheter aortic valve replacement for severe aortic stenosis  [facility]/TTS/Dr. Kolluru/102 kg/ LUE AVF  1.  End-stage renal disease We will arrange for routine hemodialysis while in the hospital  2.  Anemia of chronic kidney disease Hemoglobin 10.1 Continue Procrit with hemodialysis  3.  Secondary hyperparathyroidism Continue home dose of Sensipar and PhosLo Low phosphorus diet Monitor phosphorus during hospitalization  4.  E. coli UTI Treatment as per primary team Currently getting ciprofloxacin   LOS: 0 [facility] 11/16/20181:36 PM  [facility] [place], [place] [phone]

## 2017-03-05 NOTE — Progress Notes (Signed)
Pharmacy Antibiotic Note  KALONI BADOLATO is a 71 y.o. female with a h/o ESRD admitted on 03/04/2017 with a recent h/o E coli UTI apparently failing outpatient Ceftin. Pharmacy has been consulted for Cipro dosing.  Plan: Ciprofloxacin 400 mg iv q 24 hours.   Height: 5\' 5"  (165.1 cm) Weight: 239 lb (108.4 kg) IBW/kg (Calculated) : 57  Temp (24hrs), Avg:98.2 F (36.8 C), Min:97.8 F (36.6 C), Max:98.6 F (37 C)  Recent Labs  Lab 02/27/17 1050 03/04/17 0715 03/05/17 0429 03/05/17 0846  WBC 6.2 7.3 5.6  --   CREATININE 7.36* 6.37* 8.23*  --   LATICACIDVEN  --   --   --  1.6    Estimated Creatinine Clearance: 7.8 mL/min (A) (by C-G formula based on SCr of 8.23 mg/dL (H)).    Allergies  Allergen Reactions  . Nodolor [Isometheptene-Dichloral-Apap] Other (See Comments)    Reaction:  Headaches     Antimicrobials this admission: Ceftin PTA Ciprofloxacin 11/15 >>  Dose adjustments this admission:   Microbiology results: 11/10 UCx: E coli pan-sensitive except ampicillin and Unasyn 11/15 UCx: sent  Thank you for allowing pharmacy to be a part of this patient's care.  Carola Frost 03/05/2017 1:08 PM

## 2017-03-05 NOTE — Care Management (Signed)
Patient is from home.  Currently not receiving any services in the home.  Chronic HD at [facility] Chronic home oxygen Placed in observation for altered mental status most likely due to UTI.  She is alert and oriented x 3. Do not anticipate any discharge needs. Dimas Chyle with [employer] is aware of admission and anticipated discharge today

## 2017-03-05 NOTE — Plan of Care (Signed)
  Education: Knowledge of General Education information will improve 03/05/2017 0229 - Progressing by Dorna Leitz, RN

## 2017-03-08 ENCOUNTER — Telehealth: Payer: Self-pay | Admitting: *Deleted

## 2017-03-08 DIAGNOSIS — R001 Bradycardia, unspecified: Secondary | ICD-10-CM

## 2017-03-08 NOTE — Telephone Encounter (Signed)
Lmov for patient to call and schedule °

## 2017-03-08 NOTE — Telephone Encounter (Signed)
-----   Message from Ariel Iba, MD sent at 03/05/2017  1:24 PM EST ----- Regarding: event Can we arrange placement of zio monitor for 2 weeks For episodes of bradycardia heart rate 40 bpm recorded in the hospital Patient can come Tuesday morning, would call daughter to arrange thx TG

## 2017-03-15 ENCOUNTER — Ambulatory Visit (INDEPENDENT_AMBULATORY_CARE_PROVIDER_SITE_OTHER): Payer: Medicare Other

## 2017-03-15 DIAGNOSIS — R001 Bradycardia, unspecified: Secondary | ICD-10-CM | POA: Diagnosis not present

## 2017-03-17 ENCOUNTER — Inpatient Hospital Stay: Payer: Medicare Other | Admitting: Family Medicine

## 2017-03-17 NOTE — Telephone Encounter (Signed)
Patient had monitor placed and advised that we would call her with results. She verbalized understanding with no further questions.

## 2017-03-18 ENCOUNTER — Encounter: Payer: Self-pay | Admitting: Family Medicine

## 2017-03-18 ENCOUNTER — Ambulatory Visit (INDEPENDENT_AMBULATORY_CARE_PROVIDER_SITE_OTHER): Payer: Medicare Other | Admitting: Family Medicine

## 2017-03-18 ENCOUNTER — Ambulatory Visit: Payer: Medicare Other | Admitting: Family Medicine

## 2017-03-18 VITALS — BP 130/70 | HR 79 | Temp 97.7°F | Wt 230.0 lb

## 2017-03-18 DIAGNOSIS — I350 Nonrheumatic aortic (valve) stenosis: Secondary | ICD-10-CM | POA: Diagnosis not present

## 2017-03-18 DIAGNOSIS — R001 Bradycardia, unspecified: Secondary | ICD-10-CM | POA: Diagnosis not present

## 2017-03-18 DIAGNOSIS — I5032 Chronic diastolic (congestive) heart failure: Secondary | ICD-10-CM

## 2017-03-18 DIAGNOSIS — N6320 Unspecified lump in the left breast, unspecified quadrant: Secondary | ICD-10-CM | POA: Diagnosis not present

## 2017-03-18 DIAGNOSIS — F331 Major depressive disorder, recurrent, moderate: Secondary | ICD-10-CM | POA: Diagnosis not present

## 2017-03-18 DIAGNOSIS — R413 Other amnesia: Secondary | ICD-10-CM | POA: Diagnosis not present

## 2017-03-18 DIAGNOSIS — B9620 Unspecified Escherichia coli [E. coli] as the cause of diseases classified elsewhere: Secondary | ICD-10-CM | POA: Diagnosis not present

## 2017-03-18 DIAGNOSIS — N390 Urinary tract infection, site not specified: Secondary | ICD-10-CM | POA: Diagnosis not present

## 2017-03-18 DIAGNOSIS — R404 Transient alteration of awareness: Secondary | ICD-10-CM

## 2017-03-18 DIAGNOSIS — N186 End stage renal disease: Secondary | ICD-10-CM

## 2017-03-18 MED ORDER — SERTRALINE HCL 25 MG PO TABS: 25.0000 mg | ORAL_TABLET | Freq: Every day | ORAL | 3 refills | Status: DC

## 2017-03-18 MED ORDER — ALPRAZOLAM 0.5 MG PO TBDP: 0.5000 mg | ORAL_TABLET | Freq: Every day | ORAL | 0 refills | Status: DC | PRN

## 2017-03-18 NOTE — Progress Notes (Signed)
BP 130/70 (BP Location: Right Arm, Patient Position: Sitting, Cuff Size: Large)   Pulse 79   Temp 97.7 F (36.5 C) (Oral)   Wt 230 lb (104.3 kg)   SpO2 99% Comment: 3 L  BMI 38.27 kg/m    CC: hosp f/u visit Subjective:    Patient ID: Ariel Smith, female    DOB: 1945/05/30, 71 y.o.   MRN: [id number]  HPI: Ariel Smith is a 71 y.o. female presenting on 03/18/2017 for Hospitalization Follow-up (Wants to discuss alprazolam)   Last seen 03/2016. Has not followed up.   Recent hospitalization for acute encephalopathy in setting of partially treated UTI. She was resistant to seek medical care. Initial UCx grew E coli. Ceftin was changed to cipro during hospitalization. MRI brain was negative for stroke. seroquel was started at bedtime for sleep, benadryl was discontinued. Xanax dose was decreased to 0.25mg  PRN (she was previously taking 1mg  1/2 tablet night before and 1/2 tablet morning of dialysis). Aricept was discontinued due to concern over bradycardia.   Since home, staying short winded. Persistent ongoing dry cough. Denies fevers/chills, abd pain, dysuria, chest pain. Ongoing anxiety over dialysis.   PT eval in hospital - no home needs.   Zio 2 wk monitor placed for bradycardia noted on tele during hospitalization. ?SSS. Pending outpatient f/u with cardiology.   ESRD - on HD MWF.   History of severe aortic stenosis status post T AVR. On low-dose Eliquis for anticoagulation in history of DVT/PE as well.   Abnormal mammogram - benign L breast biopsy done 04/2016 (cluster of benign cysts), but there was a second mass that was not biopsied - pt refused.   Date of admission: 03/04/2017 Date of discharge: 03/05/2017 No TCM hosp f/u phone call performed.   Discharge dx:  Altered mental status UTI  D/C condition: stable Consults: Janalyn Harder  Relevant past medical, surgical, family and social history reviewed and updated as indicated. Interim medical history since our last  visit reviewed. Allergies and medications reviewed and updated. Outpatient Medications Prior to Visit  Medication Sig Dispense Refill  . acetaminophen (TYLENOL) 500 MG tablet Take 1,000 mg by mouth every 6 (six) hours as needed for mild pain or headache.     . albuterol (VENTOLIN HFA) 108 (90 BASE) MCG/ACT inhaler Inhale 2 puffs into the lungs every 4 (four) hours as needed for wheezing or shortness of breath. 18 g 3  . atorvastatin (LIPITOR) 40 MG tablet TAKE 1 TABLET BY MOUTH ONCE DAILY AT  6PM. NEEDS OFFICE VISIT. 90 tablet 0  . budesonide-formoterol (SYMBICORT) 160-4.5 MCG/ACT inhaler Inhale 2 puffs into the lungs 2 (two) times daily.    . calcitRIOL (ROCALTROL) 0.25 MCG capsule Take 0.25 mcg 3 (three) times a week by mouth.     . calcium acetate (PHOSLO) 667 MG capsule Take 667-1,334 mg 3 (three) times daily with meals by mouth. Take 2 with meals and 1 with a snack    . cinacalcet (SENSIPAR) 30 MG tablet Take 30 mg by mouth daily.    . clopidogrel (PLAVIX) 75 MG tablet TAKE ONE TABLET BY MOUTH ONCE DAILY WITH BREAKFAST 90 tablet 2  . diphenhydrAMINE (BENADRYL) 25 MG tablet Take 25 mg by mouth every 6 (six) hours as needed.    Marland Kitchen ELIQUIS 2.5 MG TABS tablet TAKE ONE TABLET BY MOUTH TWICE DAILY 60 tablet 6  . famotidine (PEPCID) 40 MG tablet Take 1 tablet (40 mg total) by mouth every evening. 30 tablet 5  .  furosemide (LASIX) 80 MG tablet Take 80 mg daily by mouth.    . lidocaine-prilocaine (EMLA) cream Apply 1 application topically as needed (topical anesthesia for hemodialysis if Gebauers and Lidocaine injection are ineffective.). 30 g 0  . Multiple Vitamin (MULTIVITAMIN WITH MINERALS) TABS tablet Take 1 tablet by mouth daily.    . pantoprazole (PROTONIX) 40 MG tablet TAKE ONE TABLET BY MOUTH ONCE DAILY AS NEEDED 90 tablet 1  . traZODone (DESYREL) 50 MG tablet TAKE 1 & 1/2 (ONE & ONE-HALF) TABLETS BY MOUTH AT BEDTIME 135 tablet 1  . ALPRAZolam (XANAX) 0.25 MG tablet Take 1 tablet (0.25 mg  total) daily as needed by mouth for anxiety. (Patient taking differently: Take 0.25 mg by mouth daily as needed for anxiety. Takes 1/2 tablet) 15 tablet 0   No facility-administered medications prior to visit.      Per HPI unless specifically indicated in ROS section below Review of Systems     Objective:    BP 130/70 (BP Location: Right Arm, Patient Position: Sitting, Cuff Size: Large)   Pulse 79   Temp 97.7 F (36.5 C) (Oral)   Wt 230 lb (104.3 kg)   SpO2 99% Comment: 3 L  BMI 38.27 kg/m   Wt Readings from Last 3 Encounters:  03/18/17 230 lb (104.3 kg)  03/05/17 240 lb 4.8 oz (109 kg)  02/27/17 280 lb (127 kg)    Physical Exam  Constitutional: She appears well-developed and well-nourished. No distress.  HENT:  Mouth/Throat: Oropharynx is clear and moist. No oropharyngeal exudate.  Eyes: Conjunctivae and EOM are normal. Pupils are equal, round, and reactive to light. No scleral icterus.  Cardiovascular: Normal rate, regular rhythm and intact distal pulses.  Murmur (2/6 systolic murmur) heard. Pulmonary/Chest: Effort normal and breath sounds normal. No respiratory distress. She has no wheezes. She has no rales.  Musculoskeletal: She exhibits no edema.  Skin: Skin is warm and dry. No rash noted.  Psychiatric: She has a normal mood and affect.  Nursing note and vitals reviewed.      Assessment & Plan:   Problem List Items Addressed This Visit    Altered mental status - Primary    Recent AMS thought related to UTI that was not fully resolved after cipro course, changed to ceftin and completed course. Back to baseline.       Aortic valvar stenosis    S/p TAVR.       Bradycardia    Currently undergoing zio 2wk monitor. Will await results.       Breast mass, left    H/o abnormal mammogram late 2017 s/p benign L breast biopsy done 04/2016 (cluster of benign cysts). 2nd mass was also recommended to be biopsied, pt refused this.  Agrees to update imaging in f/u today.        Relevant Orders   MM Digital Diagnostic Bilat   US BREAST LTD UNI LEFT INC AXILLA   Chronic diastolic CHF (congestive heart failure) (HCC)    Seems relatively euvolemic.      E. coli UTI (urinary tract infection)    This has resolved after transition from cipro to ceftin.       End stage renal disease (HCC) (Chronic)   MDD (major depressive disorder), recurrent episode, moderate (HCC)    Chronic, deteriorated, with ongoing anxiety around dialysis sessions. celexa 20mg  was stopped earlier this year, unclear when or why. Previously well controlled. Now only taking xanax and trazodone. I encouraged she restart SSRI - start low  dose sertraline 25mg  daily. Discussed it will take approx 4 wks to take full effect. Pt denies SI/HI.       Relevant Medications   ALPRAZolam (NIRAVAM) 0.5 MG dissolvable tablet   sertraline (ZOLOFT) 25 MG tablet   Memory deficit    aricept was recently stopped due to concerns over bradycardia/SSS. Will continue to monitor memory.  Due for updated geriatric assessment.           Follow up plan: Return if symptoms worsen or fail to improve.  Eustaquio BoydenJavier Kavitha Lansdale, MD

## 2017-03-18 NOTE — Patient Instructions (Addendum)
I've sent in lower alprazolam dose 0.5mg  - take 1/2 to 1 tablet as up to now, try to minimize use.  Start new medicine sertraline 25mg  daily for anxiety.  We will schedule mammogram and ultrasound.  Keep follow up appointment.

## 2017-03-21 DIAGNOSIS — B9620 Unspecified Escherichia coli [E. coli] as the cause of diseases classified elsewhere: Secondary | ICD-10-CM | POA: Insufficient documentation

## 2017-03-21 DIAGNOSIS — N6320 Unspecified lump in the left breast, unspecified quadrant: Secondary | ICD-10-CM | POA: Insufficient documentation

## 2017-03-21 DIAGNOSIS — N390 Urinary tract infection, site not specified: Secondary | ICD-10-CM | POA: Insufficient documentation

## 2017-03-21 DIAGNOSIS — R001 Bradycardia, unspecified: Secondary | ICD-10-CM | POA: Insufficient documentation

## 2017-03-21 NOTE — Assessment & Plan Note (Signed)
Seems relatively euvolemic.

## 2017-03-21 NOTE — Assessment & Plan Note (Signed)
S/p TAVR 

## 2017-03-21 NOTE — Assessment & Plan Note (Signed)
aricept was recently stopped due to concerns over bradycardia/SSS. Will continue to monitor memory.  Due for updated geriatric assessment.

## 2017-03-21 NOTE — Assessment & Plan Note (Signed)
This has resolved after transition from cipro to ceftin.

## 2017-03-21 NOTE — Assessment & Plan Note (Addendum)
Chronic, deteriorated, with ongoing anxiety around dialysis sessions. celexa 20mg  was stopped earlier this year, unclear when or why. Previously well controlled. Now only taking xanax and trazodone. I encouraged she restart SSRI - start low dose sertraline 25mg  daily. Discussed it will take approx 4 wks to take full effect. Pt denies SI/HI.

## 2017-03-21 NOTE — Assessment & Plan Note (Signed)
Currently undergoing zio 2wk monitor. Will await results.

## 2017-03-21 NOTE — Assessment & Plan Note (Addendum)
H/o abnormal mammogram late 2017 s/p benign L breast biopsy done 04/2016 (cluster of benign cysts). 2nd mass was also recommended to be biopsied, pt refused this.  Agrees to update imaging in f/u today.

## 2017-03-21 NOTE — Assessment & Plan Note (Signed)
Recent AMS thought related to UTI that was not fully resolved after cipro course, changed to ceftin and completed course. Back to baseline.

## 2017-03-22 ENCOUNTER — Other Ambulatory Visit: Payer: Self-pay | Admitting: Family Medicine

## 2017-03-25 ENCOUNTER — Ambulatory Visit (INDEPENDENT_AMBULATORY_CARE_PROVIDER_SITE_OTHER): Payer: Medicare Other | Admitting: Family Medicine

## 2017-03-25 ENCOUNTER — Encounter: Payer: Self-pay | Admitting: Family Medicine

## 2017-03-25 VITALS — BP 120/64 | HR 82 | Temp 97.9°F | Wt 229.0 lb

## 2017-03-25 DIAGNOSIS — H9193 Unspecified hearing loss, bilateral: Secondary | ICD-10-CM

## 2017-03-25 DIAGNOSIS — R413 Other amnesia: Secondary | ICD-10-CM

## 2017-03-25 DIAGNOSIS — N186 End stage renal disease: Secondary | ICD-10-CM | POA: Diagnosis not present

## 2017-03-25 DIAGNOSIS — J9611 Chronic respiratory failure with hypoxia: Secondary | ICD-10-CM | POA: Diagnosis not present

## 2017-03-25 DIAGNOSIS — J449 Chronic obstructive pulmonary disease, unspecified: Secondary | ICD-10-CM | POA: Diagnosis not present

## 2017-03-25 NOTE — Assessment & Plan Note (Addendum)
Endorsed by patient and daughter. Failed our last hearing screen 06/2015. Will refer to audiology for formal hearing evaluation.

## 2017-03-25 NOTE — Patient Instructions (Addendum)
Good to see you today. We will refer you to neurology.  We will refer you to audiologist for formal hearing evaluation.

## 2017-03-25 NOTE — Assessment & Plan Note (Addendum)
Signs of cognitive impairment worse over the past year. Reviewed MRI from recent hospitalization showing signs of old posterior R MCA territory infarct. ?Vascular vs alzheimer's dementia. I don't think this is parkinson disease. ESRD and chronic O2 dependent resp failure likely contribute. Will refer to neurology for evaluation per daughter's request.  Aricept recently stopped due to bradycardia concern.  I recommended starting namenda - daughter prefers to see neurology first.  Encouraged memory stimulation including reading, memory puzzles, and social engagement.

## 2017-03-25 NOTE — Progress Notes (Signed)
BP 120/64 (BP Location: Left Arm, Patient Position: Sitting, Cuff Size: Large)   Pulse 82   Temp 97.9 F (36.6 C) (Oral)   Wt 229 lb (103.9 kg)   SpO2 96%   BMI 38.11 kg/m    CC: memory concerns Subjective:    Patient ID: Ariel Smith, female    DOB: December 16, 1945, 71 y.o.   MRN: [id number]  HPI: Ariel Smith is a 71 y.o. female presenting on 03/25/2017 for Memory Loss (Progressively worsening for 1 yr)   Here with daughter today.  R handed.   Seen here last week for hospital and SNF f/u visit after hospitalization for acute encephalopathy thought due to UTI. MRI brain at that time negative for stroke. seroquel was started. Aricept was discontinued due to concern over bradycardia.   Progressive memory concerns noted by daughter over the last year. Forgetfulness, increased confusion - at times doesn't recognize surroundings, doesn't recognize family members. Sometimes has difficulty with comprehension. Worsening L>R hand tremor. More unstable gait. Uses quad cane regularly. No trouble with long term memory.  Pt feels she's losing L ear hearing. Daughter bought her [employer] hearing aide.  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 Pt unaware of this.  Daughter requests neurology referral.   Zio 2 wk monitor placed for bradycardia noted on tele during hospitalization. ?SSS. Pending outpatient f/u with cardiology.  ESRD - on HD MWF. Continues to produce urine.  History of severe aortic stenosis status post T AVR. On low-dose Eliquis for anticoagulation in history of DVT/PE as well.   Geriatric Assessment:  Activities of Daily Living:     Bathing- independent     Dressing- independent     Eating- independent     Toileting- independent     Transferring- independent     Continence- independent  Overall Assessment: independent   Instrumental Activities of Daily Living:     Transportation- dependent     Meal/Food Preparation- dependent     Shopping Errands- dependent   Housekeeping/Chores- dependent     Money Management/Finances- dependent     Medication Management- dependent     Ability to Use Telephone- partially dependent     Laundry- dependent  Overall Assessment: dependent    Mental Status Exam: 23/30 (value/max value)     Clock Drawing Score: 2/4  Relevant past medical, surgical, family and social history reviewed and updated as indicated. Interim medical history since our last visit reviewed. Allergies and medications reviewed and updated. Outpatient Medications Prior to Visit  Medication Sig Dispense Refill  . acetaminophen (TYLENOL) 500 MG tablet Take 1,000 mg by mouth every 6 (six) hours as needed for mild pain or headache.     . albuterol (VENTOLIN HFA) 108 (90 BASE) MCG/ACT inhaler Inhale 2 puffs into the lungs every 4 (four) hours as needed for wheezing or shortness of breath. 18 g 3  . ALPRAZolam (NIRAVAM) 0.5 MG dissolvable tablet Take 1 tablet (0.5 mg total) by mouth daily as needed for anxiety (take 1/2-1 tablet). 30 tablet 0  . atorvastatin (LIPITOR) 40 MG tablet TAKE 1 TABLET BY MOUTH ONCE DAILY AT  6PM. NEEDS OFFICE VISIT. 90 tablet 0  . budesonide-formoterol (SYMBICORT) 160-4.5 MCG/ACT inhaler Inhale 2 puffs into the lungs 2 (two) times daily.    . calcitRIOL (ROCALTROL) 0.25 MCG capsule Take 0.25 mcg 3 (three) times a week by mouth.     . calcium acetate (PHOSLO) 667 MG capsule Take 667-1,334 mg 3 (three) times daily with meals by  mouth. Take 2 with meals and 1 with a snack    . cinacalcet (SENSIPAR) 30 MG tablet Take 30 mg by mouth daily.    . clopidogrel (PLAVIX) 75 MG tablet TAKE ONE TABLET BY MOUTH ONCE DAILY WITH BREAKFAST 90 tablet 2  . diphenhydrAMINE (BENADRYL) 25 MG tablet Take 25 mg by mouth every 6 (six) hours as needed.    Marland Kitchen. ELIQUIS 2.5 MG TABS tablet TAKE ONE TABLET BY MOUTH TWICE DAILY 60 tablet 6  . famotidine (PEPCID) 40 MG tablet Take 1 tablet (40 mg total) by mouth every evening. 30 tablet 5  . furosemide (LASIX)  80 MG tablet Take 80 mg daily by mouth.    . lidocaine-prilocaine (EMLA) cream Apply 1 application topically as needed (topical anesthesia for hemodialysis if Gebauers and Lidocaine injection are ineffective.). 30 g 0  . Multiple Vitamin (MULTIVITAMIN WITH MINERALS) TABS tablet Take 1 tablet by mouth daily.    . pantoprazole (PROTONIX) 40 MG tablet TAKE 1 TABLET BY MOUTH ONCE DAILY AS NEEDED 90 tablet 1  . sertraline (ZOLOFT) 25 MG tablet Take 1 tablet (25 mg total) by mouth daily. 30 tablet 3  . traZODone (DESYREL) 50 MG tablet TAKE 1 & 1/2 (ONE & ONE-HALF) TABLETS BY MOUTH AT BEDTIME 135 tablet 1   No facility-administered medications prior to visit.      Per HPI unless specifically indicated in ROS section below Review of Systems     Objective:    BP 120/64 (BP Location: Left Arm, Patient Position: Sitting, Cuff Size: Large)   Pulse 82   Temp 97.9 F (36.6 C) (Oral)   Wt 229 lb (103.9 kg)   SpO2 96%   BMI 38.11 kg/m   Wt Readings from Last 3 Encounters:  03/25/17 229 lb (103.9 kg)  03/18/17 230 lb (104.3 kg)  03/05/17 240 lb 4.8 oz (109 kg)    Physical Exam  Constitutional: She is oriented to person, place, and time. She appears well-developed and well-nourished. No distress.  HENT:  Mouth/Throat: Oropharynx is clear and moist. No oropharyngeal exudate.  Cardiovascular: Normal rate, regular rhythm and intact distal pulses.  Murmur heard. Pulmonary/Chest: Effort normal and breath sounds normal. No respiratory distress. She has no wheezes. She has no rales.  Musculoskeletal: She exhibits no edema.  Neurological: She is alert and oriented to person, place, and time. She has normal strength. No cranial nerve deficit or sensory deficit. Coordination and gait (slowed but steady with quad cane use) normal.  CN 2-12 intact FTN intact EOMI No masked fascies No cogwheel rigidity  Skin: Skin is warm and dry. No rash noted.  Psychiatric: She has a normal mood and affect.  Nursing  note and vitals reviewed.  Lab Results  Component Value Date   VITAMINB12 360 03/04/2017       Assessment & Plan:   Problem List Items Addressed This Visit    Bilateral hearing loss    Endorsed by patient and daughter. Failed our last hearing screen 06/2015. Will refer to audiology for formal hearing evaluation.       Relevant Orders   Ambulatory referral to Audiology   Chronic respiratory failure (HCC)   COPD (chronic obstructive pulmonary disease) (HCC) (Chronic)   End stage renal disease (HCC) (Chronic)   Memory deficit - Primary    Signs of cognitive impairment worse over the past year. Reviewed MRI from recent hospitalization showing signs of old posterior R MCA territory infarct. ?Vascular vs alzheimer's dementia. I don't think this  is parkinson disease. ESRD and chronic O2 dependent resp failure likely contribute. Will refer to neurology for evaluation per daughter's request.  Aricept recently stopped due to bradycardia concern.  I recommended starting namenda - daughter prefers to see neurology first.  Encouraged memory stimulation including reading, memory puzzles, and social engagement.       Relevant Orders   Ambulatory referral to Neurology       Follow up plan: Return if symptoms worsen or fail to improve.  [name], MD

## 2017-04-02 ENCOUNTER — Telehealth: Payer: Self-pay | Admitting: Family Medicine

## 2017-04-02 DIAGNOSIS — N6320 Unspecified lump in the left breast, unspecified quadrant: Secondary | ICD-10-CM

## 2017-04-02 NOTE — Telephone Encounter (Signed)
I called to schedule pt diag mammogram and ultra sound.  Per jamie the diag mammogram needs to be changed to IMG [id number]  And please also add ltd uni right ultra sound IMG [id number] thanks

## 2017-04-05 NOTE — Telephone Encounter (Signed)
Done

## 2017-04-29 ENCOUNTER — Telehealth: Payer: Self-pay | Admitting: Cardiovascular Disease

## 2017-04-29 NOTE — Telephone Encounter (Signed)
Patient returning call for results 

## 2017-04-30 ENCOUNTER — Telehealth: Payer: Self-pay | Admitting: Cardiovascular Disease

## 2017-04-30 NOTE — Telephone Encounter (Signed)
I spoke with the patient regarding her monitor results.

## 2017-04-30 NOTE — Telephone Encounter (Signed)
The patient is aware of her monitor results. She states she has intermittent dizziness throughout the week that her renal doctor wanted her to make [place] aware of. Episodes of dizziness are not related to dialysis. I have advised her to monitor her HR/ BP over the next week when she is having episodes of dizziness and call us back next week with these readings. She voices understanding.

## 2017-05-04 ENCOUNTER — Other Ambulatory Visit: Payer: Self-pay | Admitting: Pulmonary Disease

## 2017-05-05 IMAGING — CR DG CHEST 1V PORT
1 series · 1 of 1 positions shown · non-contrast
Comparison: 01/12/2014

CLINICAL DATA: Shortness of Breath

EXAM:
PORTABLE CHEST - 1 VIEW

[portable]
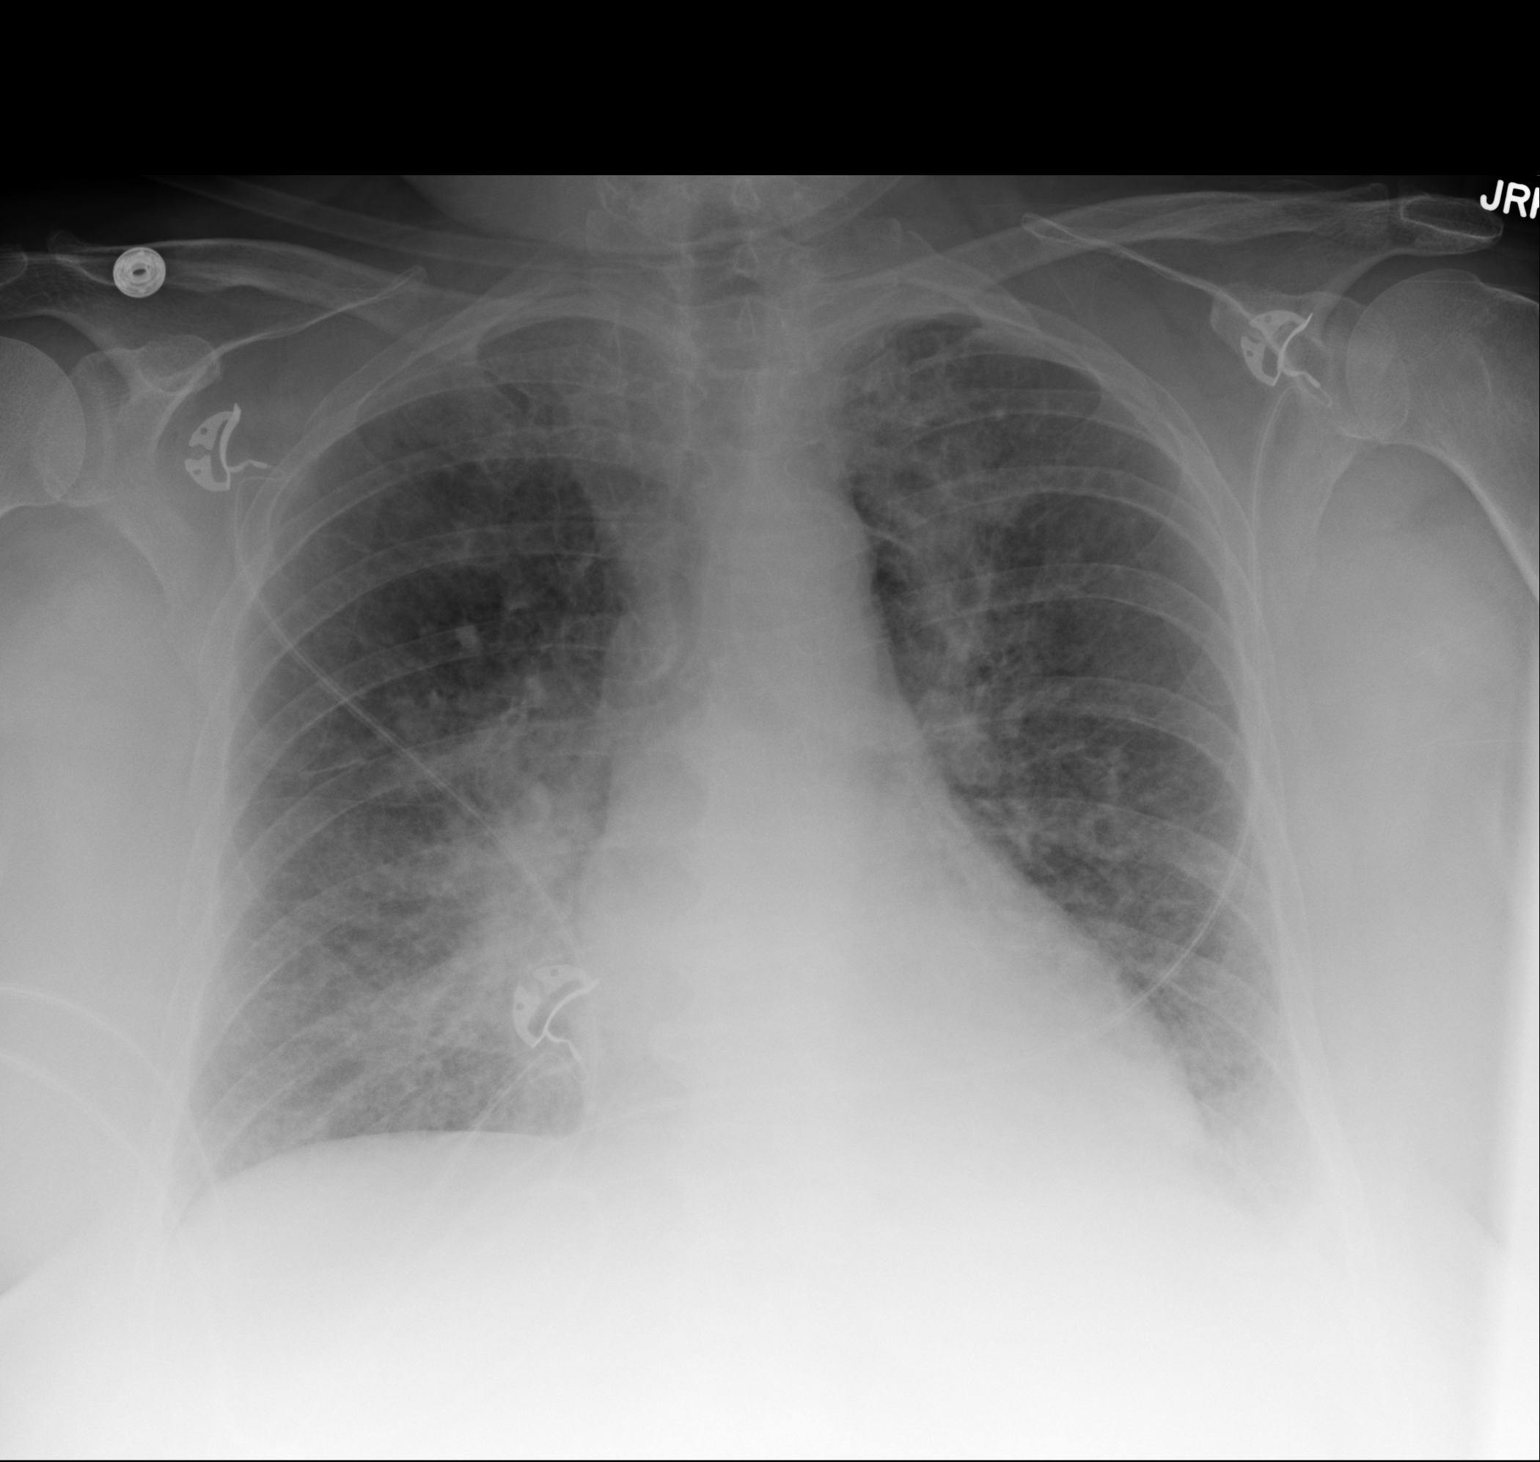

[1 of 1 positions shown; findings below may reference images not displayed]

FINDINGS: Cardiac shadow is within normal limits. Patchy infiltrate is noted
in the medial left apex as well as in the right perihilar and
infrahilar region. No sizable effusion is seen. No bony abnormality
is noted.
IMPRESSION: Patchy infiltrates bilaterally. Followup PA and lateral chest X-ray
is recommended in 3-4 weeks following trial of antibiotic therapy to
ensure resolution and exclude underlying malignancy.

## 2017-05-10 IMAGING — CR DG CHEST 1V
1 series · 1 of 1 positions shown · non-contrast
Comparison: 11/14/2014, chest CT 11/12/2014, chest x-ray 11/12/2014

CLINICAL DATA: 69-year-old female with a history of dyspnea.

EXAM:
CHEST  1 VIEW

[portable]
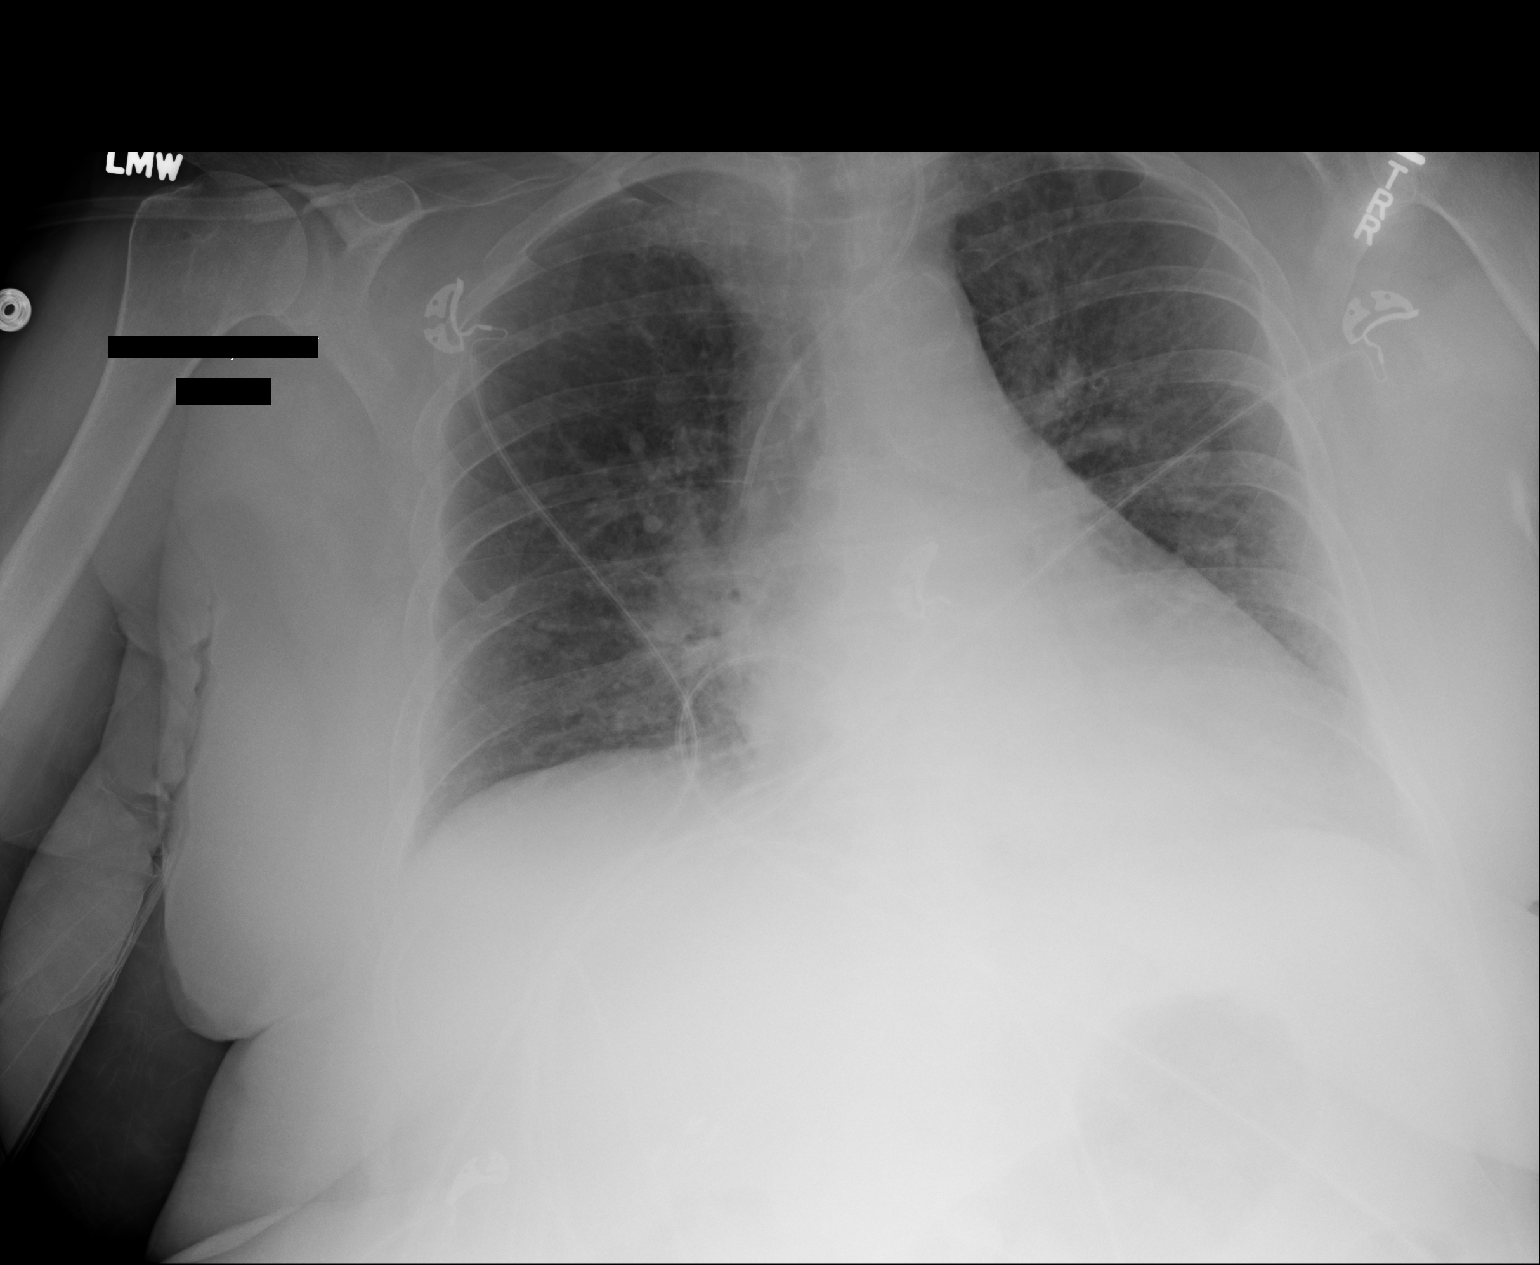

[1 of 1 positions shown; findings below may reference images not displayed]

FINDINGS: Cardiomediastinal silhouette unchanged.

Low lung volumes.

Improving interstitial and airspace opacities in the hilar regions
and lower lungs. Retrocardiac region not well evaluated. No large
pleural effusion. No pneumothorax.

Unchanged position of left IJ approach central venous catheter.

Atherosclerotic calcifications of the aortic arch.

No displaced fracture.
IMPRESSION: Improving bilateral interstitial and airspace disease.

Unchanged central venous catheter.

Atherosclerosis.

## 2017-05-16 ENCOUNTER — Other Ambulatory Visit: Payer: Self-pay | Admitting: Family Medicine

## 2017-05-18 ENCOUNTER — Other Ambulatory Visit: Payer: Medicare Other

## 2017-05-24 ENCOUNTER — Encounter: Payer: Self-pay | Admitting: Family Medicine

## 2017-05-24 DIAGNOSIS — Z8673 Personal history of transient ischemic attack (TIA), and cerebral infarction without residual deficits: Secondary | ICD-10-CM | POA: Insufficient documentation

## 2017-05-24 DIAGNOSIS — F0150 Vascular dementia without behavioral disturbance: Secondary | ICD-10-CM | POA: Insufficient documentation

## 2017-05-24 DIAGNOSIS — G309 Alzheimer's disease, unspecified: Secondary | ICD-10-CM

## 2017-05-24 DIAGNOSIS — F0280 Dementia in other diseases classified elsewhere without behavioral disturbance: Secondary | ICD-10-CM

## 2017-05-25 ENCOUNTER — Other Ambulatory Visit: Payer: Self-pay | Admitting: Family Medicine

## 2017-05-25 ENCOUNTER — Other Ambulatory Visit: Payer: Self-pay | Admitting: Cardiovascular Disease

## 2017-05-25 NOTE — Telephone Encounter (Signed)
Eliquis last filled:  04/18/17, #60 Alprazolam last filled:  03/23/17, #30 Last OV:  03/25/17 Next OV:  none

## 2017-05-28 MED ORDER — ALPRAZOLAM 0.5 MG PO TBDP: 0.5000 mg | ORAL_TABLET | Freq: Every day | ORAL | 0 refills | Status: DC | PRN

## 2017-05-28 NOTE — Telephone Encounter (Signed)
Sent electronically 

## 2017-06-03 IMAGING — CR DG CHEST 1V PORT
1 series · 1 of 1 positions shown · non-contrast
Comparison: 11/18/2014

CLINICAL DATA: Shortness of breath with decreased oxygen
saturation.

EXAM:
PORTABLE CHEST - 1 VIEW

[portable]
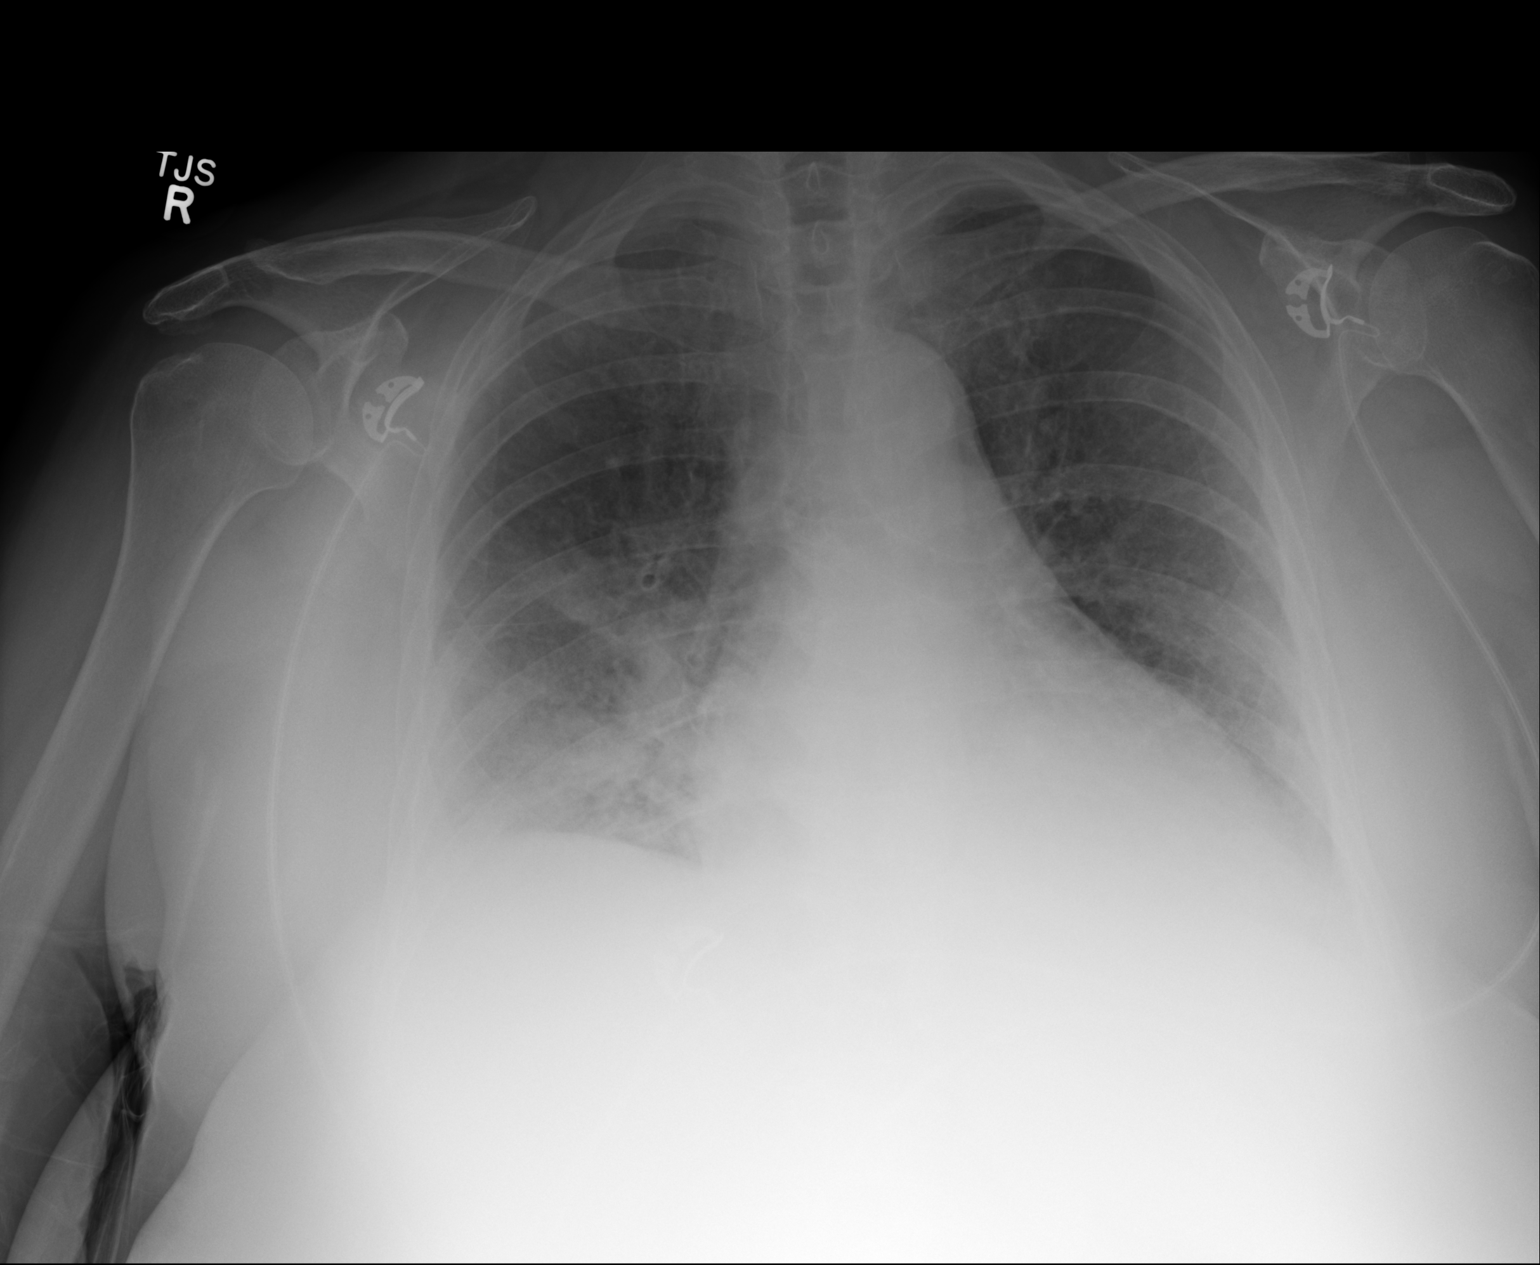

[1 of 1 positions shown; findings below may reference images not displayed]

FINDINGS: Cardiac enlargement without vascular congestion. Increasing
infiltration in the right mid and lower lung suggesting pneumonia.
No blunting of costophrenic angles. No pneumothorax.
IMPRESSION: Increased right lung infiltrates suggesting pneumonia. Cardiac
enlargement.

## 2017-06-06 IMAGING — CR DG CHEST 2V
1 series · 2 of 2 positions shown · non-contrast
Comparison: 12/11/2014

CLINICAL DATA: Shortness of Breath

EXAM:
CHEST - 2 VIEW

[Series 1: x chest ap · 0.14mm/px · 2 of 2 slices shown]
[im 1/2]
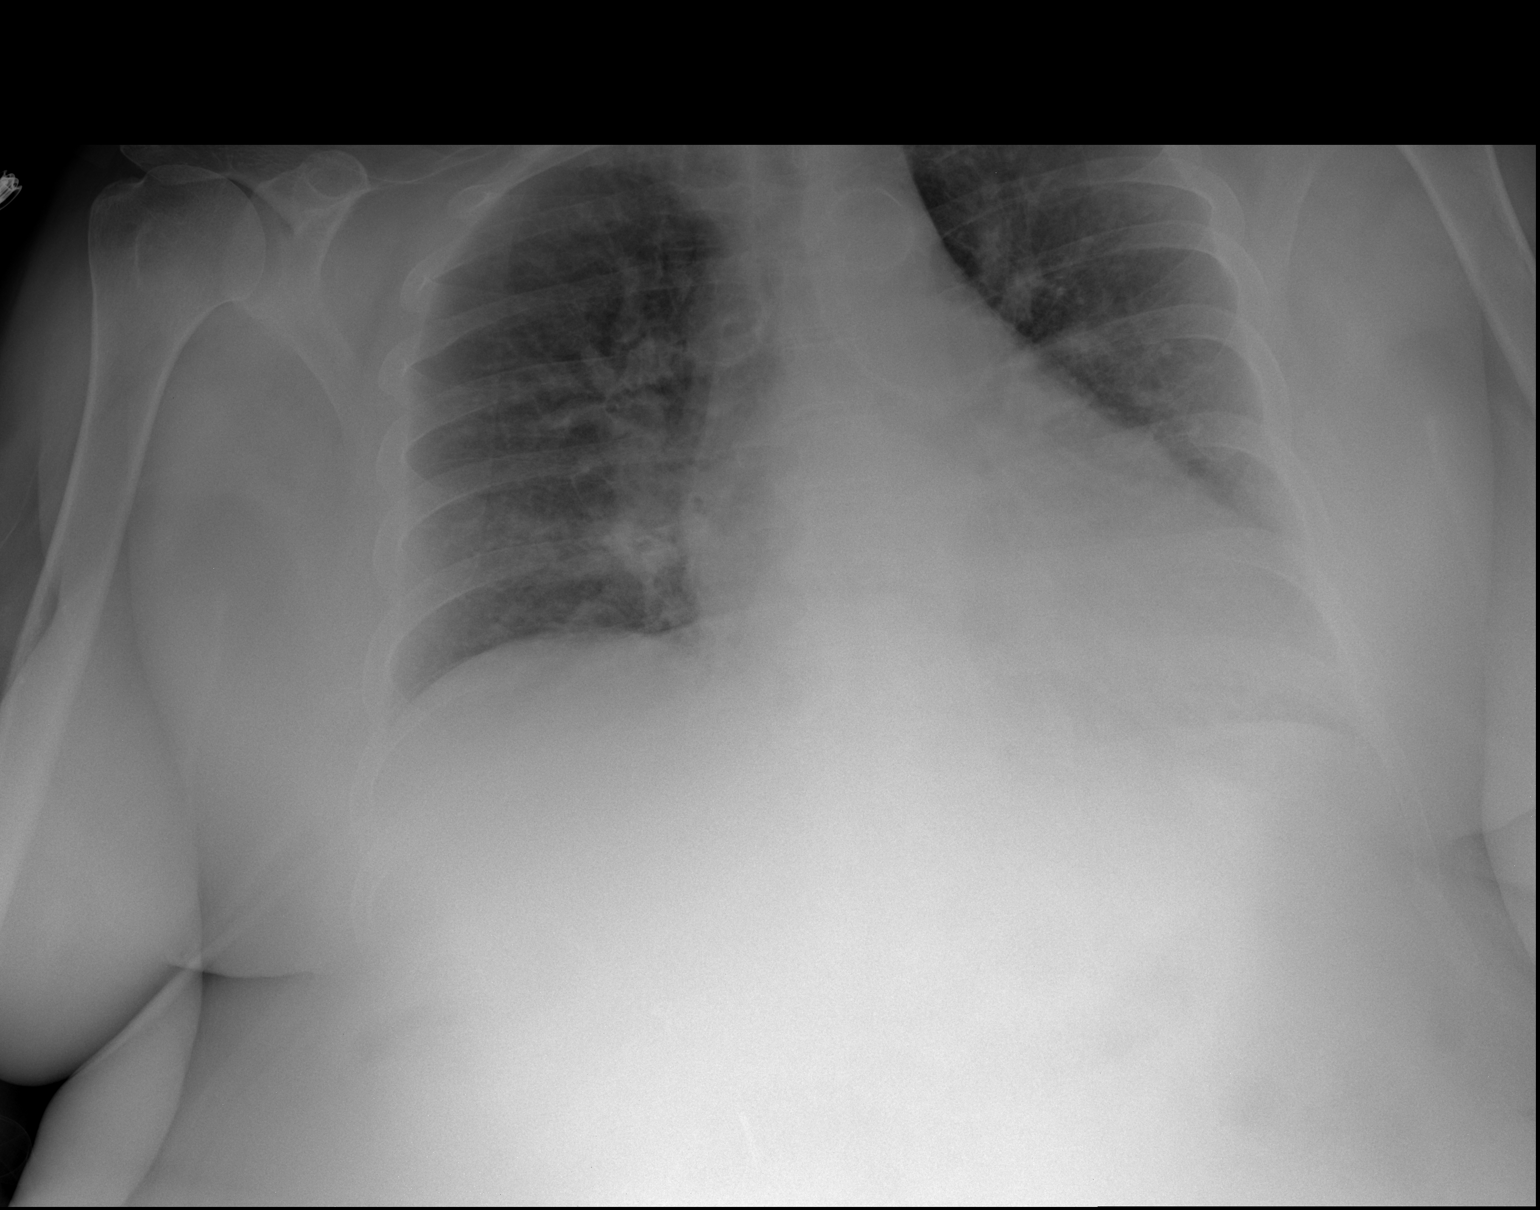
[im 2/2]
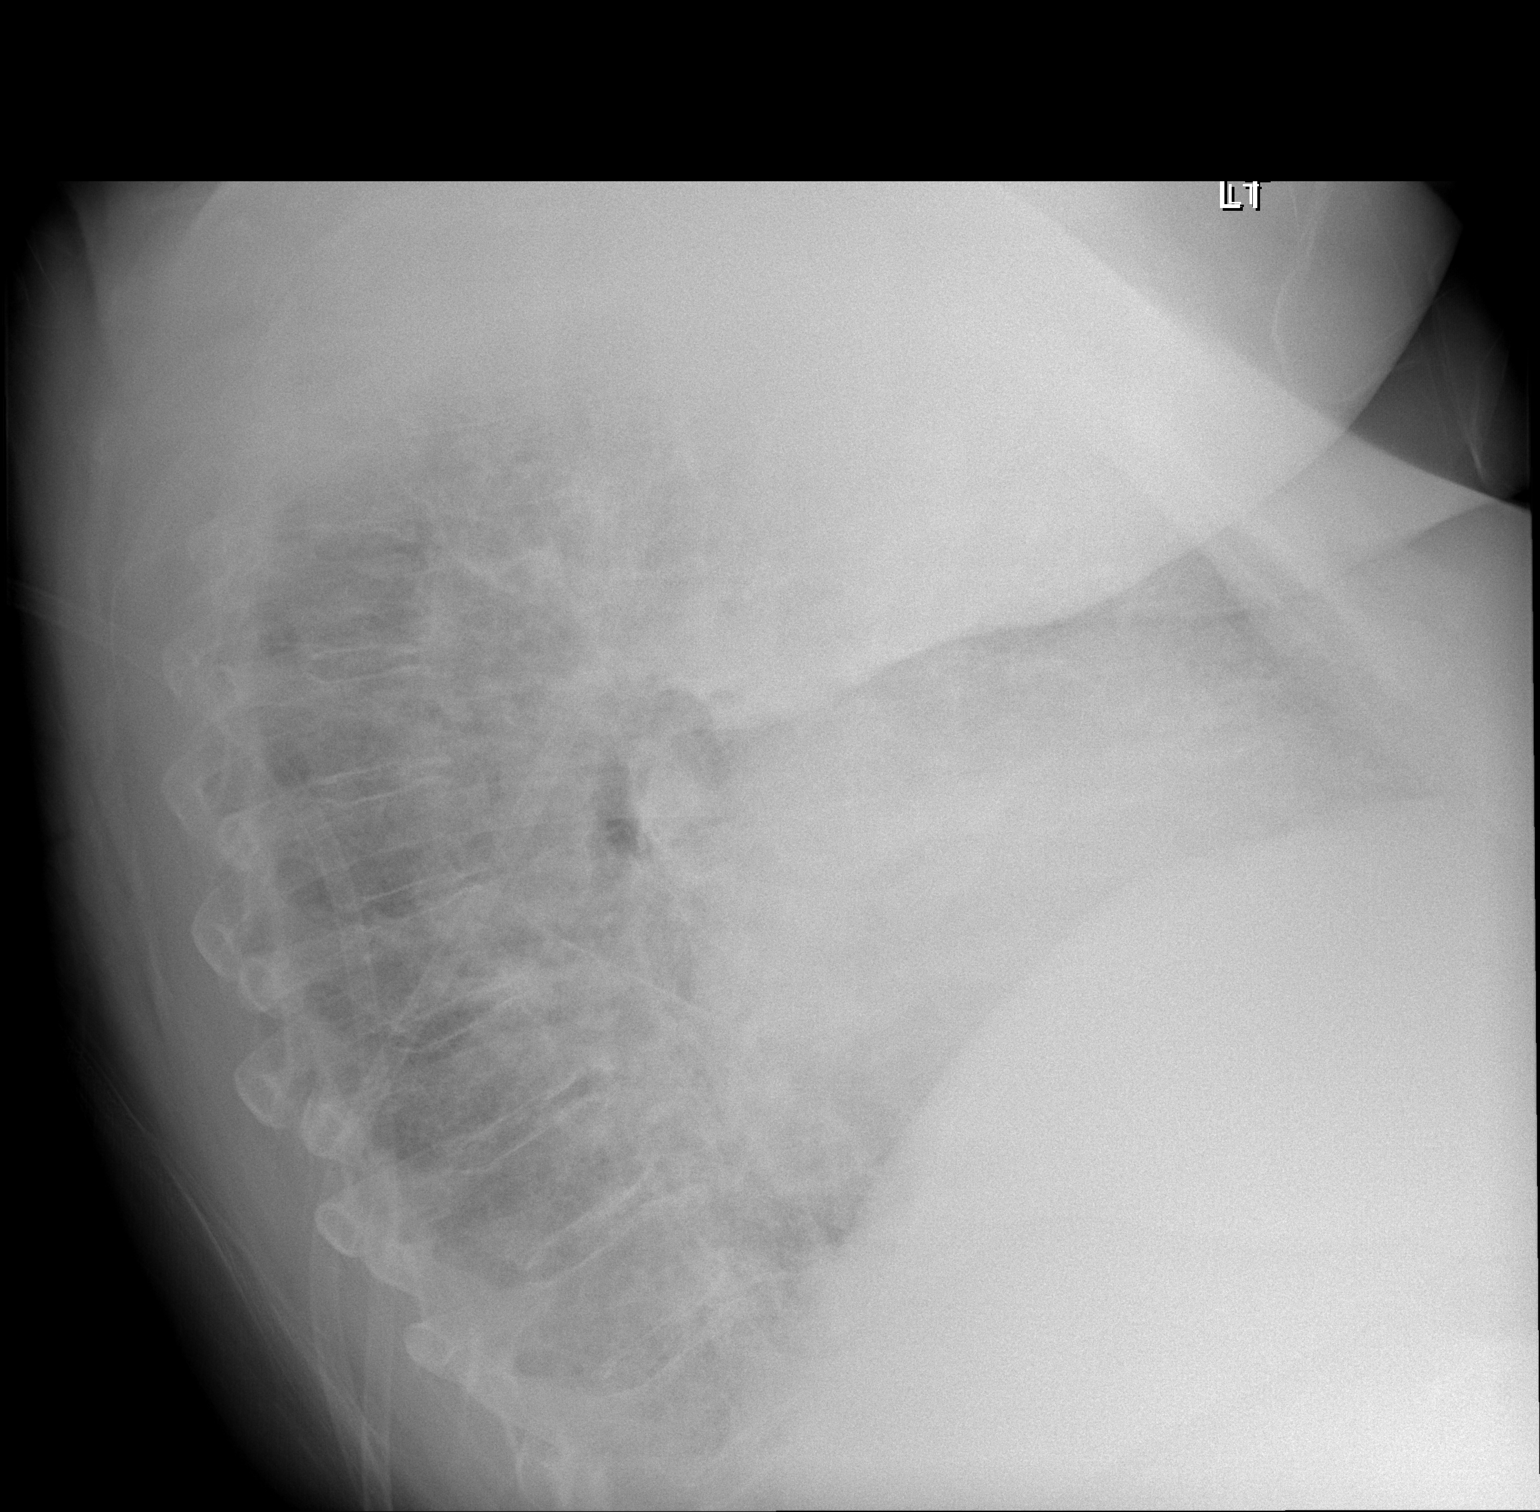

[2 of 2 positions shown; findings below may reference images not displayed]

FINDINGS: Cardiac shadow is stable. The lungs are well aerated bilaterally.
The previously seen right basilar infiltrate has shown some mild
improvement when compare with the prior exam. No new focal
infiltrate is seen.
IMPRESSION: Slight improved aeration in the right lung base.

## 2017-06-15 ENCOUNTER — Telehealth: Payer: Self-pay | Admitting: Family Medicine

## 2017-06-15 ENCOUNTER — Encounter: Payer: Self-pay | Admitting: Family Medicine

## 2017-06-15 ENCOUNTER — Ambulatory Visit (INDEPENDENT_AMBULATORY_CARE_PROVIDER_SITE_OTHER): Payer: Medicare Other

## 2017-06-15 ENCOUNTER — Ambulatory Visit: Payer: Medicare Other | Admitting: Family Medicine

## 2017-06-15 ENCOUNTER — Ambulatory Visit (INDEPENDENT_AMBULATORY_CARE_PROVIDER_SITE_OTHER)
Admission: RE | Admit: 2017-06-15 | Discharge: 2017-06-15 | Disposition: A | Discharge status: Home / Self Care | Payer: Medicare Other | Source: Ambulatory Visit | Attending: Family Medicine | Admitting: Family Medicine

## 2017-06-15 VITALS — BP 108/70 | HR 88 | Temp 98.6°F | Ht 62.5 in | Wt 228.5 lb

## 2017-06-15 DIAGNOSIS — R413 Other amnesia: Secondary | ICD-10-CM | POA: Diagnosis not present

## 2017-06-15 DIAGNOSIS — F331 Major depressive disorder, recurrent, moderate: Secondary | ICD-10-CM | POA: Diagnosis not present

## 2017-06-15 DIAGNOSIS — R05 Cough: Secondary | ICD-10-CM | POA: Diagnosis not present

## 2017-06-15 DIAGNOSIS — J449 Chronic obstructive pulmonary disease, unspecified: Secondary | ICD-10-CM | POA: Diagnosis not present

## 2017-06-15 DIAGNOSIS — R1013 Epigastric pain: Secondary | ICD-10-CM | POA: Insufficient documentation

## 2017-06-15 DIAGNOSIS — M545 Low back pain, unspecified: Secondary | ICD-10-CM

## 2017-06-15 DIAGNOSIS — N186 End stage renal disease: Secondary | ICD-10-CM

## 2017-06-15 DIAGNOSIS — Z0000 Encounter for general adult medical examination without abnormal findings: Secondary | ICD-10-CM

## 2017-06-15 DIAGNOSIS — R053 Chronic cough: Secondary | ICD-10-CM

## 2017-06-15 LAB — CBC WITH DIFFERENTIAL/PLATELET
BASOS ABS: 0 10*3/uL (ref 0.0–0.1)
Basophils Relative: 0.7 % (ref 0.0–3.0)
EOS PCT: 1.2 % (ref 0.0–5.0)
Eosinophils Absolute: 0.1 10*3/uL (ref 0.0–0.7)
HCT: 31.3 % — ABNORMAL LOW (ref 36.0–46.0)
Hemoglobin: 10.5 g/dL — ABNORMAL LOW (ref 12.0–15.0)
LYMPHS ABS: 0.9 10*3/uL (ref 0.7–4.0)
Lymphocytes Relative: 13.5 % (ref 12.0–46.0)
MCHC: 33.5 g/dL (ref 30.0–36.0)
MCV: 107.4 fl — ABNORMAL HIGH (ref 78.0–100.0)
MONO ABS: 0.6 10*3/uL (ref 0.1–1.0)
Monocytes Relative: 8.5 % (ref 3.0–12.0)
NEUTROS PCT: 76.1 % (ref 43.0–77.0)
Neutro Abs: 5.1 10*3/uL (ref 1.4–7.7)
Platelets: 188 10*3/uL (ref 150.0–400.0)
RBC: 2.91 Mil/uL — ABNORMAL LOW (ref 3.87–5.11)
RDW: 13.9 % (ref 11.5–15.5)
WBC: 6.7 10*3/uL (ref 4.0–10.5)

## 2017-06-15 LAB — COMPREHENSIVE METABOLIC PANEL
ALT: 10 U/L (ref 0–35)
AST: 15 U/L (ref 0–37)
Albumin: 3.9 g/dL (ref 3.5–5.2)
Alkaline Phosphatase: 100 U/L (ref 39–117)
BILIRUBIN TOTAL: 0.3 mg/dL (ref 0.2–1.2)
BUN: 35 mg/dL — AB (ref 6–23)
CO2: 31 mEq/L (ref 19–32)
Calcium: 10 mg/dL (ref 8.4–10.5)
Chloride: 98 mEq/L (ref 96–112)
Creatinine, Ser: 7.35 mg/dL (ref 0.40–1.20)
GFR: 5.81 mL/min — CL (ref >60.00)
GLUCOSE: 119 mg/dL — AB (ref 70–99)
Potassium: 4.4 mEq/L (ref 3.5–5.1)
SODIUM: 140 meq/L (ref 135–145)
Total Protein: 7 g/dL (ref 6.0–8.3)

## 2017-06-15 LAB — LIPASE: LIPASE: 15 U/L (ref 11.0–59.0)

## 2017-06-15 MED ORDER — TRAMADOL HCL 50 MG PO TABS: 25.0000 mg | ORAL_TABLET | Freq: Two times a day (BID) | ORAL | 0 refills | Status: AC | PRN

## 2017-06-15 MED ORDER — SERTRALINE HCL 50 MG PO TABS: 50.0000 mg | ORAL_TABLET | Freq: Every day | ORAL | 6 refills | Status: AC

## 2017-06-15 NOTE — Progress Notes (Signed)
PCP notes:   Health maintenance:  No gaps identified.  Abnormal screenings:   Depression score: 8 Depression screen [facility] 2/9 06/15/2017 07/10/2015 01/16/2015 11/01/2014 03/30/2014  Decreased Interest 1 3 0 2 0  Down, Depressed, Hopeless 0 1 2 3 3   PHQ - 2 Score 1 4 2 5 3   Altered sleeping 2 0 0 3 3  Tired, decreased energy 0 0 3 3 3   Change in appetite 0 0 3 3 2   Feeling bad or failure about yourself  0 1 0 2 1  Trouble concentrating 0 0 0 3 0  Moving slowly or fidgety/restless 3 0 0 0 1  Suicidal thoughts 2 (No Data) 0 - (No Data)  PHQ-9 Score 8 5 8 19 13   Difficult doing work/chores - Not difficult at all Somewhat difficult Not difficult at all -   Patient concerns:   Patient is here today to see PCP regarding generalized pain.  Nurse concerns:  None  Next PCP appt:   06/15/17 @ 1015

## 2017-06-15 NOTE — Assessment & Plan Note (Signed)
Known L2 compression fracture - will update lumbar imaging with worsening pain in setting of worsening coughing fits help r/p progression of L2 fracture or new vertebral fracture. Rx tramadol 25-50mg  BID PRN discomfort.

## 2017-06-15 NOTE — Telephone Encounter (Signed)
Chronic. 

## 2017-06-15 NOTE — Assessment & Plan Note (Signed)
Deteriorated. Will increase sertraline to 50mg  daily.

## 2017-06-15 NOTE — Patient Instructions (Signed)
Ariel Smith , Thank you for taking time to come for your Medicare Wellness Visit. I appreciate your ongoing commitment to your health goals. Please review the following plan we discussed and let me know if I can assist you in the future.   These are the goals we discussed: Goals    . Follow up with Primary Care Provider     Starting 06/15/2017, I will continue to take medications as prescribed and to keep appointments with PCP as scheduled.        This is a list of the screening recommended for you and due dates:  Health Maintenance  Topic Date Due  . DTaP/Tdap/Td vaccine (1 - Tdap) 07/09/2025*  . Tetanus Vaccine  07/09/2025*  . Mammogram  01/15/2018  . Colon Cancer Screening  08/18/2021  . Flu Shot  Completed  . DEXA scan (bone density measurement)  Completed  .  Hepatitis C: One time screening is recommended by Center for Disease Control  (CDC) for  adults born from [birth year] through 1964.   Completed  . Pneumonia vaccines  Completed  *Topic was postponed. The date shown is not the original due date.   Preventive Care for Adults  A healthy lifestyle and preventive care can promote health and wellness. Preventive health guidelines for adults include the following key practices.  . A routine yearly physical is a good way to check with your health care provider about your health and preventive screening. It is a chance to share any concerns and updates on your health and to receive a thorough exam.  . Visit your dentist for a routine exam and preventive care every 6 months. Brush your teeth twice a day and floss once a day. Good oral hygiene prevents tooth decay and gum disease.  . The frequency of eye exams is based on your age, health, family medical history, use  of contact lenses, and other factors. Follow your health care provider's recommendations for frequency of eye exams.  . Eat a healthy diet. Foods like vegetables, fruits, whole grains, low-fat dairy products, and lean protein  foods contain the nutrients you need without too many calories. Decrease your intake of foods high in solid fats, added sugars, and salt. Eat the right amount of calories for you. Get information about a proper diet from your health care provider, if necessary.  . Regular physical exercise is one of the most important things you can do for your health. Most adults should get at least 150 minutes of moderate-intensity exercise (any activity that increases your heart rate and causes you to sweat) each week. In addition, most adults need muscle-strengthening exercises on 2 or more days a week.  Silver Sneakers may be a benefit available to you. To determine eligibility, you may visit the website: www.silversneakers.com or contact program at 806-310-0117 Mon-Fri between 8AM-8PM.   . Maintain a healthy weight. The body mass index (BMI) is a screening tool to identify possible weight problems. It provides an estimate of body fat based on height and weight. Your health care provider can find your BMI and can help you achieve or maintain a healthy weight.   For adults 20 years and older: ? A BMI below 18.5 is considered underweight. ? A BMI of 18.5 to 24.9 is normal. ? A BMI of 25 to 29.9 is considered overweight. ? A BMI of 30 and above is considered obese.   . Maintain normal blood lipids and cholesterol levels by exercising and minimizing your intake of saturated  fat. Eat a balanced diet with plenty of fruit and vegetables. Blood tests for lipids and cholesterol should begin at age 34 and be repeated every 5 years. If your lipid or cholesterol levels are high, you are over 50, or you are at high risk for heart disease, you may need your cholesterol levels checked more frequently. Ongoing high lipid and cholesterol levels should be treated with medicines if diet and exercise are not working.  . If you smoke, find out from your health care provider how to quit. If you do not use tobacco, please do not  start.  . If you choose to drink alcohol, please do not consume more than 2 drinks per day. One drink is considered to be 12 ounces (355 mL) of beer, 5 ounces (148 mL) of wine, or 1.5 ounces (44 mL) of liquor.  . If you are 25-22 years old, ask your health care provider if you should take aspirin to prevent strokes.  . Use sunscreen. Apply sunscreen liberally and repeatedly throughout the day. You should seek shade when your shadow is shorter than you. Protect yourself by wearing long sleeves, pants, a wide-brimmed hat, and sunglasses year round, whenever you are outdoors.  . Once a month, do a whole body skin exam, using a mirror to look at the skin on your back. Tell your health care provider of new moles, moles that have irregular borders, moles that are larger than a pencil eraser, or moles that have changed in shape or color.

## 2017-06-15 NOTE — Progress Notes (Signed)
BP 108/70 (BP Location: Right Arm, Patient Position: Sitting, Cuff Size: Large)   Pulse 88   Temp 98.6 F (37 C) (Oral)   Ht 5' 2.5" (1.588 m) Comment: no shoes  Wt 228 lb 8 oz (103.6 kg)   SpO2 94%   BMI 41.13 kg/m    CC: lower back, abd pain Subjective:    Patient ID: Ariel Smith, female    DOB: 11-08-1945, 72 y.o.   MRN: [id number]  HPI: Ariel Smith is a 72 y.o. female presenting on 06/15/2017 for Annual Exam (Pt 2.  Pt accompained by daughter, Lynnell Dike.)   Sharyn Creamer earlier today for AMW. Note will be reviewed.  3 wk h/o epigastric abdominal pain with radiation to R lower back and R buttock. Worried this may be cough related. Ongoing nausea. No vomiting, diarrhea. No blood in stool. Intermittent GERD symptoms with anything with red sauce.  Ongoing chronic cough, progressively worsening recently as well. Dry cough. Cough started with dialysis about 6 yrs ago. Has seen cardiology, has seen pulmonology thought not heart or pulm related. Daughter stopped her symbicort because she didn't feel it was helping. They are frustrated with ongoing cough. She is taking protonix and pepcid daily.  No fevers/chills, head or chest congestion.   Off calcitriol and sensipar and lasix.   PHQ9 = 11.   Relevant past medical, surgical, family and social history reviewed and updated as indicated. Interim medical history since our last visit reviewed. Allergies and medications reviewed and updated. Outpatient Medications Prior to Visit  Medication Sig Dispense Refill  . acetaminophen (TYLENOL) 500 MG tablet Take 1,000 mg by mouth every 6 (six) hours as needed for mild pain or headache.     . albuterol (VENTOLIN HFA) 108 (90 BASE) MCG/ACT inhaler Inhale 2 puffs into the lungs every 4 (four) hours as needed for wheezing or shortness of breath. 18 g 3  . ALPRAZolam (NIRAVAM) 0.5 MG dissolvable tablet Take 1 tablet (0.5 mg total) by mouth daily as needed for anxiety (take 1/2-1 tablet). 30 tablet 0    . atorvastatin (LIPITOR) 40 MG tablet TAKE 1 TABLET BY MOUTH ONCE DAILY AT  6PM. NEEDS OFFICE VISIT. 90 tablet 0  . calcium acetate (PHOSLO) 667 MG capsule Take 667-1,334 mg 3 (three) times daily with meals by mouth. Take 2 with meals and 1 with a snack    . clopidogrel (PLAVIX) 75 MG tablet TAKE ONE TABLET BY MOUTH ONCE DAILY WITH BREAKFAST 90 tablet 2  . diphenhydrAMINE (BENADRYL) 25 MG tablet Take 25 mg by mouth every 6 (six) hours as needed.    Marland Kitchen ELIQUIS 2.5 MG TABS tablet TAKE 1 TABLET BY MOUTH TWICE DAILY 60 tablet 6  . famotidine (PEPCID) 40 MG tablet TAKE 1 TABLET BY MOUTH IN THE EVENING 30 tablet 5  . lidocaine-prilocaine (EMLA) cream Apply 1 application topically as needed (topical anesthesia for hemodialysis if Gebauers and Lidocaine injection are ineffective.). 30 g 0  . Multiple Vitamin (MULTIVITAMIN WITH MINERALS) TABS tablet Take 1 tablet by mouth daily.    . pantoprazole (PROTONIX) 40 MG tablet TAKE 1 TABLET BY MOUTH ONCE DAILY AS NEEDED 90 tablet 1  . traZODone (DESYREL) 50 MG tablet TAKE 1 & 1/2 (ONE & ONE-HALF) TABLETS BY MOUTH AT BEDTIME 135 tablet 1  . sertraline (ZOLOFT) 25 MG tablet Take 1 tablet (25 mg total) by mouth daily. 30 tablet 3   No facility-administered medications prior to visit.      Per HPI  unless specifically indicated in ROS section below Review of Systems     Objective:    BP 108/70 (BP Location: Right Arm, Patient Position: Sitting, Cuff Size: Large)   Pulse 88   Temp 98.6 F (37 C) (Oral)   Ht 5' 2.5" (1.588 m) Comment: no shoes  Wt 228 lb 8 oz (103.6 kg)   SpO2 94%   BMI 41.13 kg/m   Wt Readings from Last 3 Encounters:  06/15/17 228 lb 8 oz (103.6 kg)  06/15/17 228 lb 8 oz (103.6 kg)  03/25/17 229 lb (103.9 kg)    Physical Exam  Constitutional: She appears well-developed and well-nourished. No distress.  HENT:  Mouth/Throat: Oropharynx is clear and moist. No oropharyngeal exudate.  Eyes: Conjunctivae are normal. Pupils are equal,  round, and reactive to light.  Cardiovascular: Normal rate, regular rhythm and intact distal pulses.  Murmur (3/6 systolic) heard. Pulmonary/Chest: Effort normal and breath sounds normal. No respiratory distress. She has no wheezes. She has no rales.  Abdominal: Soft. Normal appearance and bowel sounds are normal. She exhibits no distension and no mass. There is no hepatosplenomegaly. There is tenderness (mild-mod) in the right lower quadrant, epigastric area and left lower quadrant. There is no rigidity, no rebound, no guarding, no CVA tenderness and negative Murphy's sign.  Musculoskeletal: She exhibits no edema.  Discomfort to palpation midline lumbar spine No paraspinous mm tenderness Neg SLR bilaterally.  Skin: Skin is warm and dry. No rash noted.  Psychiatric: She has a normal mood and affect.  Nursing note and vitals reviewed.     Assessment & Plan:   Problem List Items Addressed This Visit    Chronic cough    S/p unrevealing cardiac and pulm evaluation. Did not improve with GERD treatment (PPI, pepcid daily). Did not improve with daily antihistamine.  Cough drops help the best.  ?cyclical cough component.  rec try tramadol 1/2-1 tab PRN pain/cough. Update if not improving, discussed possible return to pulm.      COPD (chronic obstructive pulmonary disease) (HCC) (Chronic)    Last saw pulm 10/2016. Controller medication was ineffective so she stopped this. Continues albuterol PRN.       End stage renal disease (HCC) (Chronic)    Continues HD. Now off calcitriol, sensipar, lasix.      Epigastric discomfort - Primary    Anticipate MSK cause like rib strain from ongoing coughing fits. Check labs today to r/o abdominal process such as pancreatitis. Continue PPI and pepcid.       Relevant Orders   Comprehensive metabolic panel   CBC with Differential/Platelet   Lipase   Lumbar back pain    Known L2 compression fracture - will update lumbar imaging with worsening pain in  setting of worsening coughing fits help r/p progression of L2 fracture or new vertebral fracture. Rx tramadol 25-50mg  BID PRN discomfort.       Relevant Medications   traMADol (ULTRAM) 50 MG tablet   Other Relevant Orders   DG Lumbar Spine Complete   MDD (major depressive disorder), recurrent episode, moderate (HCC)    Deteriorated. Will increase sertraline to 50mg  daily.       Relevant Medications   sertraline (ZOLOFT) 50 MG tablet   Memory deficit       Meds ordered this encounter  Medications  . traMADol (ULTRAM) 50 MG tablet    Sig: Take 0.5-1 tablets (25-50 mg total) by mouth 2 (two) times daily as needed.    Dispense:  50 tablet  Refill:  0  . sertraline (ZOLOFT) 50 MG tablet    Sig: Take 1 tablet (50 mg total) by mouth daily.    Dispense:  30 tablet    Refill:  6   Orders Placed This Encounter  Procedures  . DG Lumbar Spine Complete    Standing Status:   Future    Number of Occurrences:   1    Standing Expiration Date:   08/14/2018    Order Specific Question:   Reason for Exam (SYMPTOM  OR DIAGNOSIS REQUIRED)    Answer:   lumbar pain after cough, known old L2 compression fracture    Order Specific Question:   Preferred imaging location?    Answer:   [facility]    Order Specific Question:   Radiology Contrast Protocol - do NOT remove file path    Answer:   [email or web address]  . Comprehensive metabolic panel  . CBC with Differential/Platelet  . Lipase    Follow up plan: No Follow-up on file.  [name], MD

## 2017-06-15 NOTE — Assessment & Plan Note (Signed)
Last saw pulm 10/2016. Controller medication was ineffective so she stopped this. Continues albuterol PRN.

## 2017-06-15 NOTE — Telephone Encounter (Signed)
[facility] called critical results. Creatinine 7.35, GFR 5.81. Results given to Dr.Gutierrez.

## 2017-06-15 NOTE — Assessment & Plan Note (Signed)
Anticipate MSK cause like rib strain from ongoing coughing fits. Check labs today to r/o abdominal process such as pancreatitis. Continue PPI and pepcid.

## 2017-06-15 NOTE — Assessment & Plan Note (Addendum)
Continues HD. Now off calcitriol, sensipar, lasix.

## 2017-06-15 NOTE — Assessment & Plan Note (Signed)
S/p unrevealing cardiac and pulm evaluation. Did not improve with GERD treatment (PPI, pepcid daily). Did not improve with daily antihistamine.  Cough drops help the best.  ?cyclical cough component.  rec try tramadol 1/2-1 tab PRN pain/cough. Update if not improving, discussed possible return to pulm.

## 2017-06-15 NOTE — Progress Notes (Signed)
Pre visit review using our clinic review tool, if applicable. No additional management support is needed unless otherwise documented below in the visit note. 

## 2017-06-15 NOTE — Progress Notes (Signed)
Subjective:   Ariel Smith is a 72 y.o. female who presents for Medicare Annual (Subsequent) preventive examination.  Review of Systems:  N/A Cardiac Risk Factors include: advanced age (>47men, >39 women);obesity (BMI >30kg/m2);hypertension     Objective:     Vitals: BP 108/70 (BP Location: Right Arm, Patient Position: Sitting, Cuff Size: Large)   Pulse 88   Temp 98.6 F (37 C) (Oral)   Ht 5' 2.5" (1.588 m) Comment: no shoes  Wt 228 lb 8 oz (103.6 kg)   SpO2 94%   BMI 41.13 kg/m   Body mass index is 41.13 kg/m.  Advanced Directives 06/15/2017 03/04/2017 03/04/2017 02/27/2017 07/09/2016 10/24/2015 10/21/2015  Does Patient Have a Medical Advance Directive? Yes Yes - Yes Yes Yes Yes  Type of Advance Directive Healthcare Power of [place];Living will Healthcare Power of [employer] Power of [employer] Power of [place];Living will Healthcare Power of [employer] Power of [employer] Power of Attorney  Does patient want to make changes to medical advance directive? - - No - Patient declined No - Patient declined No - Patient declined No - Patient declined No - Patient declined  Copy of Healthcare Power of Attorney in Chart? No - copy requested Yes No - copy requested Yes No - copy requested - No - copy requested  Would patient like information on creating a medical advance directive? - - - - - No - patient declined information -    Tobacco Social History   Tobacco Use  Smoking Status Former Smoker  . Packs/day: 2.00  . Years: 30.00  . Pack years: 60.00  . Types: Cigarettes  . Start date: 04/20/1978  . Last attempt to quit: 04/21/2007  . Years since quitting: 10.1  Smokeless Tobacco Never Used     Counseling given: No   Clinical Intake:  Pre-visit preparation completed: Yes  Pain : 0-10 Pain Score: 4  Pain Type: Chronic pain Pain Location: Generalized Pain Onset: More than a month ago Pain Frequency: Constant     Nutritional Status:  BMI > 30  Obese Nutritional Risks: None Diabetes: No  How often do you need to have someone help you when you read instructions, pamphlets, or other written materials from your doctor or pharmacy?: 1 - Never What is the last grade level you completed in school?: 11th grade  Interpreter Needed?: No  Information entered by :: LPinson, LPN  Past Medical History:  Diagnosis Date  . Anemia of chronic disease   . Bronchiectasis (HCC) 08/2014    suggested by thoracic xray  . Chronic diastolic CHF (congestive heart failure) (HCC)    a. echo 10/2014: EF 60-65%, no RWMA, GR1DD; b. 03/2015 Echo: EF 60-65%, no rwma, Gr2 DD.  Marland Kitchen Chronic respiratory failure (HCC)    a. 2/2 COPD; b. on 4-5L via nasal cannula  . COPD (chronic obstructive pulmonary disease) (HCC)   . Depression   . ESRD (end stage renal disease) (HCC) 08/2011   a. on HD (TThSa), L forearm AV fistula, [name]  . Frequent headaches   . GERD (gastroesophageal reflux disease)   . GIB (gastrointestinal bleeding)    a. leading to cessation of warfarin 11/2014  . History of cardiac cath    a. 12/2014 Cath: nl cors. 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  Marland Kitchen History of colon polyps 2013   colonoscopy ([name])  . History of DVT of lower extremity 2009, 2017   a. left sided x2, with PE s/p IVC filter placement,  coumadin stopped 2/2 GI bleed in 2016; b. 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  Marland Kitchen HLD (hyperlipidemia)   . HTN (hypertension)   . Morbid obesity (HCC)   . Osteoarthritis   . Osteopenia 01/2013  . Pulmonary embolism (HCC) 2009  . Secondary hyperparathyroidism of renal origin (HCC)   . Severe aortic stenosis    a. echo 10/2014: mod to sev AS; b. 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 03/2015 Echo: EF 60-65%, Gr2 DD, m ild to mod AS (peak grad , mean ).   Past Surgical History:  Procedure Laterality Date  . A/V FISTULAGRAM Left 07/09/2016   Procedure: A/V  Fistulagram;  Surgeon: [name], MD;  Location: [facility] INVASIVE CV LAB;  Service: Cardiovascular;  Laterality: Left;  . BUNIONECTOMY Left 2003  . CARDIAC CATHETERIZATION N/A 01/18/2015   patent coronary arteries without significant osbtruction and preserved LV function, severe aortic stenosis; Procedure: Right/Left Heart Cath and Coronary Angiography;  Surgeon: Tonny Bollman, MD  . CHOLECYSTECTOMY  2012  . COLONOSCOPY  08/2011   colon biopsies, Dr. Birdie Smith  . ESOPHAGOGASTRODUODENOSCOPY  08/2011   gastric cardia polyp  . ESOPHAGOGASTRODUODENOSCOPY Left 11/11/2014   Procedure: ESOPHAGOGASTRODUODENOSCOPY (EGD);  Surgeon: [name], MD;  Location: [facility] ENDOSCOPY;  Service: Endoscopy;  Laterality: Left;  . EXTERIORIZATION OF A CONTINUOUS AMBULATORY PERITONEAL DIALYSIS CATHETER  01/2013   removal 12/2103 - [name]  . hospitalization  12/2013   recurrent R pleural effusion due to peritoneal fluid translocation s/p rpt thoracentesis with 1.3 L fluid removed, ERSD started on HD this hospitalization  . PERIPHERAL VASCULAR CATHETERIZATION N/A 11/12/2014   Procedure: IVC Filter Insertion;  Surgeon: [name], MD;  Location: [facility] INVASIVE CV LAB;  Service: Cardiovascular;  Laterality: N/A;  . PERIPHERAL VASCULAR CATHETERIZATION Left 10/24/2015   Procedure: Lower Extremity Venography with intervention (thormbolysis/thrombectomy);  Surgeon: [name], MD;  Location: [facility] INVASIVE CV LAB;  Service: Cardiovascular;  Laterality: Left;  . REPLACEMENT TOTAL KNEE Left 2006  . SHOULDER ARTHROSCOPY Right 2009  . TEE WITHOUT CARDIOVERSION N/A 02/19/2015   Procedure: TRANSESOPHAGEAL ECHOCARDIOGRAM (TEE);  Surgeon: Tonny Bollman, MD;  Location: [facility] OR;  Service: Open Heart Surgery;  Laterality: N/A;  . TONSILLECTOMY  1955  . TRANSCATHETER AORTIC VALVE REPLACEMENT, TRANSFEMORAL Right 02/19/2015   Procedure: TRANSCATHETER AORTIC VALVE REPLACEMENT, TRANSFEMORAL;  Surgeon: Tonny Bollman, MD;  Location: [facility] OR;  Service: Open  Heart Surgery;  Laterality: Right;  . TUBAL LIGATION  1980  . US ECHOCARDIOGRAPHY  12/2013   EF 55-60%, nl LV sys fxn, mild-mod MR, AS, increased LV posterior wall thickness, mild TR   Family History  Problem Relation Age of Onset  . Deep vein thrombosis Brother   . Cancer Mother 52       colon  . Stroke Mother   . Cancer Father        prostate  . Hypertension Father   . Dementia Father 3  . Hypertension Brother   . Breast cancer Daughter 27  . Diabetes Neg Hx   . CAD Neg Hx    Social History   Socioeconomic History  . Marital status: Widowed    Spouse name: None  . Number of children: None  . Years of education: None  . Highest education level: None  Social Needs  . Financial resource strain: None  . Food insecurity - worry: None  . Food insecurity - inability: None  . Transportation needs - medical:  None  . Transportation needs - non-medical: None  Occupational History  . None  Tobacco Use  . Smoking status: Former Smoker    Packs/day: 2.00    Years: 30.00    Pack years: 60.00    Types: Cigarettes    Start date: 04/20/1978    Last attempt to quit: 04/21/2007    Years since quitting: 10.1  . Smokeless tobacco: Never Used  Substance and Sexual Activity  . Alcohol use: No  . Drug use: No  . Sexual activity: No  Other Topics Concern  . None  Social History Narrative   Activity: no regular exercise   Diet: limits 32 oz/day due to dialysis, some fruits/vegetables    Outpatient Encounter Medications as of 06/15/2017  Medication Sig  . acetaminophen (TYLENOL) 500 MG tablet Take 1,000 mg by mouth every 6 (six) hours as needed for mild pain or headache.   . albuterol (VENTOLIN HFA) 108 (90 BASE) MCG/ACT inhaler Inhale 2 puffs into the lungs every 4 (four) hours as needed for wheezing or shortness of breath.  . ALPRAZolam (NIRAVAM) 0.5 MG dissolvable tablet Take 1 tablet (0.5 mg total) by mouth daily as needed for anxiety (take 1/2-1 tablet).  Marland Kitchen atorvastatin (LIPITOR)  40 MG tablet TAKE 1 TABLET BY MOUTH ONCE DAILY AT  6PM. NEEDS OFFICE VISIT.  Marland Kitchen calcium acetate (PHOSLO) 667 MG capsule Take 667-1,334 mg 3 (three) times daily with meals by mouth. Take 2 with meals and 1 with a snack  . clopidogrel (PLAVIX) 75 MG tablet TAKE ONE TABLET BY MOUTH ONCE DAILY WITH BREAKFAST  . diphenhydrAMINE (BENADRYL) 25 MG tablet Take 25 mg by mouth every 6 (six) hours as needed.  Marland Kitchen ELIQUIS 2.5 MG TABS tablet TAKE 1 TABLET BY MOUTH TWICE DAILY  . famotidine (PEPCID) 40 MG tablet TAKE 1 TABLET BY MOUTH IN THE EVENING  . lidocaine-prilocaine (EMLA) cream Apply 1 application topically as needed (topical anesthesia for hemodialysis if Gebauers and Lidocaine injection are ineffective.).  Marland Kitchen Multiple Vitamin (MULTIVITAMIN WITH MINERALS) TABS tablet Take 1 tablet by mouth daily.  . pantoprazole (PROTONIX) 40 MG tablet TAKE 1 TABLET BY MOUTH ONCE DAILY AS NEEDED  . traZODone (DESYREL) 50 MG tablet TAKE 1 & 1/2 (ONE & ONE-HALF) TABLETS BY MOUTH AT BEDTIME  . [DISCONTINUED] budesonide-formoterol (SYMBICORT) 160-4.5 MCG/ACT inhaler Inhale 2 puffs into the lungs 2 (two) times daily.  . [DISCONTINUED] calcitRIOL (ROCALTROL) 0.25 MCG capsule Take 0.25 mcg 3 (three) times a week by mouth.   . [DISCONTINUED] cinacalcet (SENSIPAR) 30 MG tablet Take 30 mg by mouth daily.  . [DISCONTINUED] furosemide (LASIX) 80 MG tablet Take 80 mg daily by mouth.  . [DISCONTINUED] sertraline (ZOLOFT) 25 MG tablet Take 1 tablet (25 mg total) by mouth daily.   No facility-administered encounter medications on file as of 06/15/2017.     Activities of Daily Living In your present state of health, do you have any difficulty performing the following activities: 06/15/2017 03/04/2017  Hearing? Malvin Johns  Vision? N N  Difficulty concentrating or making decisions? Malvin Johns  Walking or climbing stairs? Y Y  Dressing or bathing? N Y  Doing errands, shopping? Y N  Preparing Food and eating ? Y -  Comment can prepare only sandwiches;  does not cook -  Using the Toilet? N -  In the past six months, have you accidently leaked urine? Y -  Comment wears incontinence briefs daily -  Do you have problems with loss of bowel control? Y -  Managing your Medications? Y -  Managing your Finances? Y -  Comment daughter takes care of bills -  Housekeeping or managing your Housekeeping? Y -  Some recent data might be hidden    Patient Care Team: [name], MD as PCP - General (Family Medicine) Mosetta Pigeon, MD as Consulting Physician (Internal Medicine) Delma Freeze, FNP as Nurse Practitioner (Cardiology) Antonieta Iba, MD as Consulting Physician (Cardiology)    Assessment:   This is a routine wellness examination for [place].   Hearing Screening   125Hz  250Hz  500Hz  1000Hz  2000Hz  3000Hz  4000Hz  6000Hz  8000Hz   Right ear:   0 0 40  0    Left ear:   0 0 0  0    Vision Screening Comments: Last vision exam in Dec 2018 with Dr. Marti Sleigh    Exercise Activities and Dietary recommendations Current Exercise Habits: The patient does not participate in regular exercise at present, Exercise limited by: None identified  Goals    . Follow up with Primary Care Provider     Starting 06/15/2017, I will continue to take medications as prescribed and to keep appointments with PCP as scheduled.        Fall Risk Fall Risk  06/15/2017 07/10/2015 01/16/2015 03/30/2014 01/27/2013  Falls in the past year? No Yes No No No  Number falls in past yr: - 1 - - -  Injury with Fall? - No - - -  Risk for fall due to : - History of fall(s) Impaired balance/gait - -  Follow up - Falls evaluation completed - - -   Depression Screen PHQ 2/9 Scores 06/15/2017 07/10/2015 01/16/2015 11/01/2014  PHQ - 2 Score 1 4 2 5   PHQ- 9 Score 8 5 8 19      Cognitive Function MMSE - Mini Mental State Exam 06/15/2017 07/10/2015  Not completed: (No Data) -  Orientation to time - 5  Orientation to Place - 5  Registration - 3  Attention/ Calculation - 5    Recall - 3  Language- name 2 objects - 0  Language- repeat - 1  Language- follow 3 step command - 3  Language- read & follow direction - 1  Write a sentence - 0  Copy design - 0  Total score - 26    Dx: mixed dementia, vascular dementia, Alzhemier's diease. A Mini-Cog was not completed.     Immunization History  Administered Date(s) Administered  . Hepatitis B 11/06/2011, 12/04/2011, 01/04/2012, 05/26/2012  . Influenza, Seasonal, Injecte, Preservative Fre 12/25/2015  . Influenza,inj,Quad PF,6+ Mos 12/29/2014  . Influenza-Unspecified 01/18/2014, 02/01/2017  . Pneumococcal Conjugate-13 07/10/2015  . Pneumococcal Polysaccharide-23 02/18/2012  . Pneumococcal-Unspecified 02/08/2017    Screening Tests Health Maintenance  Topic Date Due  . DTaP/Tdap/Td (1 - Tdap) 07/09/2025 (Originally 04/23/1964)  . TETANUS/TDAP  07/09/2025 (Originally 04/23/1964)  . MAMMOGRAM  01/15/2018  . COLONOSCOPY  08/18/2021  . INFLUENZA VACCINE  Completed  . DEXA SCAN  Completed  . Hepatitis C Screening  Completed  . PNA vac Low Risk Adult  Completed       Plan:     I have personally reviewed, addressed, and noted the following in the patient's chart:  A. Medical and social history B. Use of alcohol, tobacco or illicit drugs  C. Current medications and supplements D. Functional ability and status E.  Nutritional status F.  Physical activity G. Advance directives H. List of other physicians I.  Hospitalizations, surgeries, and ER visits in previous 12 months J.  Vitals K. Screenings to include hearing, vision, cognitive, depression L. Referrals and appointments - none  In addition, I have reviewed and discussed with patient certain preventive protocols, quality metrics, and best practice recommendations. A written personalized care plan for preventive services as well as general preventive health recommendations were provided to patient.  See attached scanned questionnaire for additional  information.   Signed,   Randa Evens, MHA, BS, LPN Health Coach

## 2017-06-15 NOTE — Patient Instructions (Addendum)
Lumbar xrays today Labs today Try tramadol for discomfort and for cough - 1/2 tablet twice daily as needed.  Increase sertraline to 50mg  daily for mood.

## 2017-06-17 ENCOUNTER — Telehealth: Payer: Self-pay | Admitting: Family Medicine

## 2017-06-17 NOTE — Telephone Encounter (Signed)
Copied from CRM [id number]. Topic: Quick Communication - See Telephone Encounter >> Jun 17, 2017  4:43 PM Arlyss Gandy, NT wrote: CRM for notification. See Telephone encounter for: Pt would like a call to go over the result of her xray she had done on 06/15/17.  06/17/17.

## 2017-06-18 NOTE — Telephone Encounter (Signed)
Patient is calling back to get her Xray results. Please contact patient.  [phone]

## 2017-06-18 NOTE — Telephone Encounter (Signed)
See result notes.  Thanks . °

## 2017-06-20 NOTE — Progress Notes (Signed)
I reviewed health advisor's note, was available for consultation, and agree with documentation and plan.  

## 2017-06-21 NOTE — Telephone Encounter (Signed)
Spoke with pt relaying results and message per Dr. Reece Agar.  Also, mailed results to pt. [See result note.Marland Kitchen]

## 2017-06-23 ENCOUNTER — Encounter: Payer: Self-pay | Admitting: Family Medicine

## 2017-06-23 ENCOUNTER — Ambulatory Visit: Payer: Medicare Other | Admitting: Family Medicine

## 2017-06-23 VITALS — BP 122/76 | HR 72 | Temp 97.8°F | Wt 228.0 lb

## 2017-06-23 DIAGNOSIS — L720 Epidermal cyst: Secondary | ICD-10-CM | POA: Diagnosis not present

## 2017-06-23 MED ORDER — DOXYCYCLINE HYCLATE 100 MG PO TABS: 100.0000 mg | ORAL_TABLET | Freq: Two times a day (BID) | ORAL | 0 refills | Status: DC

## 2017-06-23 NOTE — Patient Instructions (Addendum)
I think you have inflamed epidermal cyst of neck. Treat with warm compresses Treat with doxycycline antibiotic for 7 days, take with food - caution with sun exposure.  Return on Friday for recheck

## 2017-06-23 NOTE — Assessment & Plan Note (Signed)
Inflamed epidermal cyst of R neck - will Rx doxy x 1 wk to see if it will resolve without need for I&D. rec warm compresses. Recheck in 2 days, if persistent will I&D then. Pt agrees with plan.

## 2017-06-23 NOTE — Progress Notes (Signed)
BP 122/76 (BP Location: Left Arm, Patient Position: Sitting, Cuff Size: Large)   Pulse 72   Temp 97.8 F (36.6 C) (Oral)   Wt 228 lb (103.4 kg)   SpO2 96% Comment: 3 L, pulsating  BMI 41.04 kg/m    CC: enlarging neck cyst Subjective:    Patient ID: Ariel Smith, female    DOB: 1946-04-01, 72 y.o.   MRN: [id number]  HPI: JOSEPHINA MELCHER is a 72 y.o. female presenting on 06/23/2017 for Cyst (Located on right posterior neck. Noticed in the 1990s. Has recently increased in size in the last 3-4 days. Area is painful.  Pt accompanied by daughter, Lynnell Dike. )   Cyst R posterior neck that started smaller, present since 1990s, progressively enlarging over last 3-4 days. More painful as well.   Denies fevers/chills. Has not tried anything for this yet.   Relevant past medical, surgical, family and social history reviewed and updated as indicated. Interim medical history since our last visit reviewed. Allergies and medications reviewed and updated. Outpatient Medications Prior to Visit  Medication Sig Dispense Refill  . acetaminophen (TYLENOL) 500 MG tablet Take 1,000 mg by mouth every 6 (six) hours as needed for mild pain or headache.     . albuterol (VENTOLIN HFA) 108 (90 BASE) MCG/ACT inhaler Inhale 2 puffs into the lungs every 4 (four) hours as needed for wheezing or shortness of breath. 18 g 3  . ALPRAZolam (NIRAVAM) 0.5 MG dissolvable tablet Take 1 tablet (0.5 mg total) by mouth daily as needed for anxiety (take 1/2-1 tablet). 30 tablet 0  . atorvastatin (LIPITOR) 40 MG tablet TAKE 1 TABLET BY MOUTH ONCE DAILY AT  6PM. NEEDS OFFICE VISIT. 90 tablet 0  . calcium acetate (PHOSLO) 667 MG capsule Take 667-1,334 mg 3 (three) times daily with meals by mouth. Take 2 with meals and 1 with a snack    . clopidogrel (PLAVIX) 75 MG tablet TAKE ONE TABLET BY MOUTH ONCE DAILY WITH BREAKFAST 90 tablet 2  . diphenhydrAMINE (BENADRYL) 25 MG tablet Take 25 mg by mouth every 6 (six) hours as needed.    Marland Kitchen  ELIQUIS 2.5 MG TABS tablet TAKE 1 TABLET BY MOUTH TWICE DAILY 60 tablet 6  . famotidine (PEPCID) 40 MG tablet TAKE 1 TABLET BY MOUTH IN THE EVENING 30 tablet 5  . lidocaine-prilocaine (EMLA) cream Apply 1 application topically as needed (topical anesthesia for hemodialysis if Gebauers and Lidocaine injection are ineffective.). 30 g 0  . Multiple Vitamin (MULTIVITAMIN WITH MINERALS) TABS tablet Take 1 tablet by mouth daily.    . pantoprazole (PROTONIX) 40 MG tablet TAKE 1 TABLET BY MOUTH ONCE DAILY AS NEEDED 90 tablet 1  . sertraline (ZOLOFT) 50 MG tablet Take 1 tablet (50 mg total) by mouth daily. 30 tablet 6  . traMADol (ULTRAM) 50 MG tablet Take 0.5-1 tablets (25-50 mg total) by mouth 2 (two) times daily as needed. 50 tablet 0  . traZODone (DESYREL) 50 MG tablet TAKE 1 & 1/2 (ONE & ONE-HALF) TABLETS BY MOUTH AT BEDTIME 135 tablet 1   No facility-administered medications prior to visit.      Per HPI unless specifically indicated in ROS section below Review of Systems     Objective:    BP 122/76 (BP Location: Left Arm, Patient Position: Sitting, Cuff Size: Large)   Pulse 72   Temp 97.8 F (36.6 C) (Oral)   Wt 228 lb (103.4 kg)   SpO2 96% Comment: 3 L, pulsating  BMI  41.04 kg/m   Wt Readings from Last 3 Encounters:  06/23/17 228 lb (103.4 kg)  06/15/17 228 lb 8 oz (103.6 kg)  06/15/17 228 lb 8 oz (103.6 kg)    Physical Exam  Constitutional: She appears well-developed and well-nourished. No distress.  Neck: Normal range of motion. Neck supple.  R posterior neck mass about 2cm diameter, fluctuant and tender with minimal erythema  Psychiatric: She has a normal mood and affect.  Nursing note and vitals reviewed.     Assessment & Plan:   Problem List Items Addressed This Visit    Epidermal cyst of neck - Primary    Inflamed epidermal cyst of R neck - will Rx doxy x 1 wk to see if it will resolve without need for I&D. rec warm compresses. Recheck in 2 days, if persistent will I&D  then. Pt agrees with plan.           Meds ordered this encounter  Medications  . doxycycline (VIBRA-TABS) 100 MG tablet    Sig: Take 1 tablet (100 mg total) by mouth 2 (two) times daily.    Dispense:  14 tablet    Refill:  0   No orders of the defined types were placed in this encounter.   Follow up plan: Return in about 2 days (around 06/25/2017) for follow up visit.  [name], MD

## 2017-06-24 ENCOUNTER — Other Ambulatory Visit: Payer: Self-pay | Admitting: Family Medicine

## 2017-06-25 ENCOUNTER — Ambulatory Visit: Payer: Medicare Other | Admitting: Family Medicine

## 2017-06-25 ENCOUNTER — Encounter: Payer: Self-pay | Admitting: Family Medicine

## 2017-06-25 VITALS — BP 124/74 | HR 82 | Temp 97.9°F | Wt 228.0 lb

## 2017-06-25 DIAGNOSIS — L720 Epidermal cyst: Secondary | ICD-10-CM

## 2017-06-25 DIAGNOSIS — M545 Low back pain, unspecified: Secondary | ICD-10-CM

## 2017-06-25 NOTE — Progress Notes (Signed)
BP 124/74 (BP Location: Right Arm, Patient Position: Sitting, Cuff Size: Large)   Pulse 82   Temp 97.9 F (36.6 C) (Oral)   Wt 228 lb (103.4 kg)   SpO2 96% Comment: 3 L, pulsating  BMI 41.04 kg/m    CC: f/u cyst Subjective:    Patient ID: Ariel Smith, female    DOB: 1945/05/01, 72 y.o.   MRN: [id number]  HPI: ARANTZA Smith is a 72 y.o. female presenting on 06/25/2017 for Cyst (Here for 2 day f/u. Says area is still painful. Has not started abx yet. )   See prior note for details.  Inflamed epidermal cyst to R neck - treated with doxy 7d course. She is on plavix and eliquis.  They have not filled doxy course.   Oh by the way requests lumbar support back brace.  Relevant past medical, surgical, family and social history reviewed and updated as indicated. Interim medical history since our last visit reviewed. Allergies and medications reviewed and updated. Outpatient Medications Prior to Visit  Medication Sig Dispense Refill  . acetaminophen (TYLENOL) 500 MG tablet Take 1,000 mg by mouth every 6 (six) hours as needed for mild pain or headache.     . albuterol (VENTOLIN HFA) 108 (90 BASE) MCG/ACT inhaler Inhale 2 puffs into the lungs every 4 (four) hours as needed for wheezing or shortness of breath. 18 g 3  . ALPRAZolam (NIRAVAM) 0.5 MG dissolvable tablet Take 1 tablet (0.5 mg total) by mouth daily as needed for anxiety (take 1/2-1 tablet). 30 tablet 0  . calcium acetate (PHOSLO) 667 MG capsule Take 667-1,334 mg 3 (three) times daily with meals by mouth. Take 2 with meals and 1 with a snack    . clopidogrel (PLAVIX) 75 MG tablet TAKE ONE TABLET BY MOUTH ONCE DAILY WITH BREAKFAST 90 tablet 2  . diphenhydrAMINE (BENADRYL) 25 MG tablet Take 25 mg by mouth every 6 (six) hours as needed.    Marland Kitchen ELIQUIS 2.5 MG TABS tablet TAKE 1 TABLET BY MOUTH TWICE DAILY 60 tablet 6  . famotidine (PEPCID) 40 MG tablet TAKE 1 TABLET BY MOUTH IN THE EVENING 30 tablet 5  . lidocaine-prilocaine (EMLA)  cream Apply 1 application topically as needed (topical anesthesia for hemodialysis if Gebauers and Lidocaine injection are ineffective.). 30 g 0  . Multiple Vitamin (MULTIVITAMIN WITH MINERALS) TABS tablet Take 1 tablet by mouth daily.    . pantoprazole (PROTONIX) 40 MG tablet TAKE 1 TABLET BY MOUTH ONCE DAILY AS NEEDED 90 tablet 1  . sertraline (ZOLOFT) 50 MG tablet Take 1 tablet (50 mg total) by mouth daily. 30 tablet 6  . traMADol (ULTRAM) 50 MG tablet Take 0.5-1 tablets (25-50 mg total) by mouth 2 (two) times daily as needed. 50 tablet 0  . traZODone (DESYREL) 50 MG tablet TAKE 1 & 1/2 (ONE & ONE-HALF) TABLETS BY MOUTH AT BEDTIME 135 tablet 1  . atorvastatin (LIPITOR) 40 MG tablet TAKE 1 TABLET BY MOUTH ONCE DAILY AT  6PM. NEEDS OFFICE VISIT. 90 tablet 0  . doxycycline (VIBRA-TABS) 100 MG tablet Take 1 tablet (100 mg total) by mouth 2 (two) times daily. (Patient not taking: Reported on 06/25/2017) 14 tablet 0   No facility-administered medications prior to visit.      Per HPI unless specifically indicated in ROS section below Review of Systems     Objective:    BP 124/74 (BP Location: Right Arm, Patient Position: Sitting, Cuff Size: Large)   Pulse 82  Temp 97.9 F (36.6 C) (Oral)   Wt 228 lb (103.4 kg)   SpO2 96% Comment: 3 L, pulsating  BMI 41.04 kg/m   Wt Readings from Last 3 Encounters:  06/25/17 228 lb (103.4 kg)  06/23/17 228 lb (103.4 kg)  06/15/17 228 lb 8 oz (103.6 kg)    Physical Exam  Constitutional: She appears well-developed and well-nourished. No distress.  Neck: Normal range of motion. Neck supple.  R posterior neck mass about 2cm diameter, fluctuant and tender with minimal erythema  Psychiatric: She has a normal mood and affect.  Nursing note and vitals reviewed.  Lab Results  Component Value Date   WBC 6.7 06/15/2017   HGB 10.5 (L) 06/15/2017   HCT 31.3 (L) 06/15/2017   MCV 107.4 (H) 06/15/2017   PLT 188.0 06/15/2017    Lab Results  Component Value  Date   CREATININE 7.35 (HH) 06/15/2017   BUN 35 (H) 06/15/2017   NA 140 06/15/2017   K 4.4 06/15/2017   CL 98 06/15/2017   CO2 31 06/15/2017     I&D Meds, vitals, and allergies reviewed.  Indication: suspect inflamed or infected epidermal cyst of right posterior neck Pt complaints of: erythema, pain, swelling Location: R posterior neck Size: 4cm diameter Verbal informed consent obtained.  Pt aware of risks not limited to but including infection, bleeding, damage to near by organs. Prep: betadine Anesthesia: 1%lidocaine with epi, good effect Incision made with #11 blade Would explored and loculations removed Wound packed with iodoform gauze Tolerated well Routine postprocedure instructions d/w pt- remove packing in 24-48h, keep area clean and bandaged, follow up if concerns/spreading erythema/pain.     Assessment & Plan:   Problem List Items Addressed This Visit    Epidermal cyst of neck - Primary    I&D performed today. Pt tolerated well. Aftercare reviewed with pt/daughter.      Lumbar back pain    Rx for lumbar support brace provided today.           No orders of the defined types were placed in this encounter.  No orders of the defined types were placed in this encounter.   Follow up plan: No Follow-up on file.  Ariel Boyden, MD

## 2017-06-25 NOTE — Patient Instructions (Addendum)
Lumbar support back brace prescription provided today to take to [facility] store We performed incision and drainage today. See below for wound care  Return on Monday for wound check. Start doxy antibiotic.   Incision and Drainage, Care After Refer to this sheet in the next few weeks. These instructions provide you with information about caring for yourself after your procedure. Your health care provider may also give you more specific instructions. Your treatment has been planned according to current medical practices, but problems sometimes occur. Call your health care provider if you have any problems or questions after your procedure. What can I expect after the procedure? After the procedure, it is common to have:  Pain or discomfort around your incision site.  Drainage from your incision.  Follow these instructions at home:  Take over-the-counter and prescription medicines only as told by your health care provider.  If you were prescribed an antibiotic medicine, take it as told by your health care provider.Do not stop taking the antibiotic even if you start to feel better.  Followinstructions from your health care provider about: ? How to take care of your incision. ? When and how you should change your packing and bandage (dressing). Wash your hands with soap and water before you change your dressing. If soap and water are not available, use hand sanitizer. ? When you should remove your dressing.  Do not take baths, swim, or use a hot tub until your health care provider approves.  Keep all follow-up visits as told by your health care provider. This is important.  Check your incision area every day for signs of infection. Check for: ? More redness, swelling, or pain. ? More fluid or blood. ? Warmth. ? Pus or a bad smell. Contact a health care provider if:  Your cyst or abscess returns.  You have a fever.  You have more redness, swelling, or pain around  your incision.  You have more fluid or blood coming from your incision.  Your incision feels warm to the touch.  You have pus or a bad smell coming from your incision. Get help right away if:  You have severe pain or bleeding.  You cannot eat or drink without vomiting.  You have decreased urine output.  You become short of breath.  You have chest pain.  You cough up blood.  The area where the incision and drainage occurred becomes numb or it tingles. This information is not intended to replace advice given to you by your health care provider. Make sure you discuss any questions you have with your health care provider. Document Released: 06/29/2011 Document Revised: 09/06/2015 Document Reviewed: 01/25/2015 Elsevier Interactive Patient Education  [employer].

## 2017-06-25 NOTE — Assessment & Plan Note (Signed)
I&D performed today. Pt tolerated well. Aftercare reviewed with pt/daughter.

## 2017-06-25 NOTE — Assessment & Plan Note (Signed)
Rx for lumbar support brace provided today.

## 2017-06-28 ENCOUNTER — Ambulatory Visit: Payer: Medicare Other | Admitting: Family Medicine

## 2017-06-28 ENCOUNTER — Encounter: Payer: Self-pay | Admitting: Family Medicine

## 2017-06-28 VITALS — BP 116/76 | HR 77 | Temp 97.9°F | Wt 227.0 lb

## 2017-06-28 DIAGNOSIS — L720 Epidermal cyst: Secondary | ICD-10-CM

## 2017-06-28 NOTE — Progress Notes (Signed)
BP 116/76 (BP Location: Right Arm, Patient Position: Sitting, Cuff Size: Large)   Pulse 77   Temp 97.9 F (36.6 C) (Oral)   Wt 227 lb (103 kg)   SpO2 95% Comment: 3 L, pulsating  BMI 40.86 kg/m    CC: wound check Subjective:    Patient ID: Ariel Smith, female    DOB: 1946-02-10, 72 y.o.   MRN: [id number]  HPI: Ariel Smith is a 72 y.o. female presenting on 06/28/2017 for Wound Check   S/p I&D of inflamed epidermal cyst Friday  Here for wound recheck. Packing accidentally pulled out 1 day post procedure.  Denies significant pain, streaking redness, fever.  Tolerating doxy course well.   Relevant past medical, surgical, family and social history reviewed and updated as indicated. Interim medical history since our last visit reviewed. Allergies and medications reviewed and updated. Outpatient Medications Prior to Visit  Medication Sig Dispense Refill  . acetaminophen (TYLENOL) 500 MG tablet Take 1,000 mg by mouth every 6 (six) hours as needed for mild pain or headache.     . albuterol (VENTOLIN HFA) 108 (90 BASE) MCG/ACT inhaler Inhale 2 puffs into the lungs every 4 (four) hours as needed for wheezing or shortness of breath. 18 g 3  . ALPRAZolam (NIRAVAM) 0.5 MG dissolvable tablet Take 1 tablet (0.5 mg total) by mouth daily as needed for anxiety (take 1/2-1 tablet). 30 tablet 0  . atorvastatin (LIPITOR) 40 MG tablet TAKE 1 TABLET BY MOUTH ONCE DAILY AT  6PM. NEEDS OFFICE VISIT. 90 tablet 0  . calcium acetate (PHOSLO) 667 MG capsule Take 667-1,334 mg 3 (three) times daily with meals by mouth. Take 2 with meals and 1 with a snack    . clopidogrel (PLAVIX) 75 MG tablet TAKE ONE TABLET BY MOUTH ONCE DAILY WITH BREAKFAST 90 tablet 2  . diphenhydrAMINE (BENADRYL) 25 MG tablet Take 25 mg by mouth every 6 (six) hours as needed.    . doxycycline (VIBRA-TABS) 100 MG tablet Take 1 tablet (100 mg total) by mouth 2 (two) times daily. 14 tablet 0  . ELIQUIS 2.5 MG TABS tablet TAKE 1 TABLET  BY MOUTH TWICE DAILY 60 tablet 6  . famotidine (PEPCID) 40 MG tablet TAKE 1 TABLET BY MOUTH IN THE EVENING 30 tablet 5  . lidocaine-prilocaine (EMLA) cream Apply 1 application topically as needed (topical anesthesia for hemodialysis if Gebauers and Lidocaine injection are ineffective.). 30 g 0  . Multiple Vitamin (MULTIVITAMIN WITH MINERALS) TABS tablet Take 1 tablet by mouth daily.    . pantoprazole (PROTONIX) 40 MG tablet TAKE 1 TABLET BY MOUTH ONCE DAILY AS NEEDED 90 tablet 1  . sertraline (ZOLOFT) 50 MG tablet Take 1 tablet (50 mg total) by mouth daily. 30 tablet 6  . traMADol (ULTRAM) 50 MG tablet Take 0.5-1 tablets (25-50 mg total) by mouth 2 (two) times daily as needed. 50 tablet 0  . traZODone (DESYREL) 50 MG tablet TAKE 1 & 1/2 (ONE & ONE-HALF) TABLETS BY MOUTH AT BEDTIME 135 tablet 1   No facility-administered medications prior to visit.      Per HPI unless specifically indicated in ROS section below Review of Systems     Objective:    BP 116/76 (BP Location: Right Arm, Patient Position: Sitting, Cuff Size: Large)   Pulse 77   Temp 97.9 F (36.6 C) (Oral)   Wt 227 lb (103 kg)   SpO2 95% Comment: 3 L, pulsating  BMI 40.86 kg/m   Wt Readings  from Last 3 Encounters:  06/28/17 227 lb (103 kg)  06/25/17 228 lb (103.4 kg)  06/23/17 228 lb (103.4 kg)    Physical Exam  Constitutional: She appears well-developed and well-nourished. No distress.  Neck:  Well approximated incision R posterior neck Some tender induration around wound  Skin: Skin is warm and dry. No rash noted.  Nursing note and vitals reviewed.      Assessment & Plan:   Problem List Items Addressed This Visit    Epidermal cyst of neck    Post procedure wound check.  Healing well. Continue doxy.  Wound approximated.  Home care reviewed.           No orders of the defined types were placed in this encounter.  No orders of the defined types were placed in this encounter.   Follow up plan: No  Follow-up on file.  [name], MD

## 2017-06-28 NOTE — Assessment & Plan Note (Signed)
Post procedure wound check.  Healing well. Continue doxy.  Wound approximated.  Home care reviewed.

## 2017-07-06 ENCOUNTER — Other Ambulatory Visit: Payer: Self-pay | Admitting: Family Medicine

## 2017-07-06 NOTE — Telephone Encounter (Signed)
Last filled:  06/01/17, #30 Last OV:  06/28/17 Next OV:  09/17/17

## 2017-07-07 NOTE — Telephone Encounter (Signed)
Eprescribed.

## 2017-07-12 ENCOUNTER — Inpatient Hospital Stay
Admission: EM | Admit: 2017-07-12 | Discharge: 2017-07-14 | DRG: 190 | Disposition: A | Discharge status: Home / Self Care | Payer: Medicare Other | Attending: Internal Medicine | Admitting: Internal Medicine

## 2017-07-12 ENCOUNTER — Telehealth: Payer: Self-pay | Admitting: Cardiovascular Disease

## 2017-07-12 ENCOUNTER — Emergency Department: Payer: Medicare Other

## 2017-07-12 ENCOUNTER — Other Ambulatory Visit: Payer: Self-pay

## 2017-07-12 ENCOUNTER — Encounter: Payer: Self-pay | Admitting: Family Medicine

## 2017-07-12 DIAGNOSIS — I132 Hypertensive heart and chronic kidney disease with heart failure and with stage 5 chronic kidney disease, or end stage renal disease: Secondary | ICD-10-CM | POA: Diagnosis present

## 2017-07-12 DIAGNOSIS — J441 Chronic obstructive pulmonary disease with (acute) exacerbation: Secondary | ICD-10-CM | POA: Diagnosis present

## 2017-07-12 DIAGNOSIS — Z79899 Other long term (current) drug therapy: Secondary | ICD-10-CM

## 2017-07-12 DIAGNOSIS — Z992 Dependence on renal dialysis: Secondary | ICD-10-CM

## 2017-07-12 DIAGNOSIS — Z6838 Body mass index (BMI) 38.0-38.9, adult: Secondary | ICD-10-CM | POA: Diagnosis not present

## 2017-07-12 DIAGNOSIS — K219 Gastro-esophageal reflux disease without esophagitis: Secondary | ICD-10-CM | POA: Diagnosis present

## 2017-07-12 DIAGNOSIS — J440 Chronic obstructive pulmonary disease with acute lower respiratory infection: Secondary | ICD-10-CM | POA: Diagnosis present

## 2017-07-12 DIAGNOSIS — N2581 Secondary hyperparathyroidism of renal origin: Secondary | ICD-10-CM | POA: Diagnosis present

## 2017-07-12 DIAGNOSIS — F329 Major depressive disorder, single episode, unspecified: Secondary | ICD-10-CM | POA: Diagnosis present

## 2017-07-12 DIAGNOSIS — R0602 Shortness of breath: Secondary | ICD-10-CM

## 2017-07-12 DIAGNOSIS — Z66 Do not resuscitate: Secondary | ICD-10-CM | POA: Diagnosis not present

## 2017-07-12 DIAGNOSIS — Z9049 Acquired absence of other specified parts of digestive tract: Secondary | ICD-10-CM | POA: Diagnosis not present

## 2017-07-12 DIAGNOSIS — J209 Acute bronchitis, unspecified: Secondary | ICD-10-CM | POA: Diagnosis present

## 2017-07-12 DIAGNOSIS — Z7902 Long term (current) use of antithrombotics/antiplatelets: Secondary | ICD-10-CM

## 2017-07-12 DIAGNOSIS — Z952 Presence of prosthetic heart valve: Secondary | ICD-10-CM

## 2017-07-12 DIAGNOSIS — M1990 Unspecified osteoarthritis, unspecified site: Secondary | ICD-10-CM | POA: Diagnosis present

## 2017-07-12 DIAGNOSIS — Z86718 Personal history of other venous thrombosis and embolism: Secondary | ICD-10-CM

## 2017-07-12 DIAGNOSIS — M8580 Other specified disorders of bone density and structure, unspecified site: Secondary | ICD-10-CM | POA: Diagnosis present

## 2017-07-12 DIAGNOSIS — R059 Cough, unspecified: Secondary | ICD-10-CM

## 2017-07-12 DIAGNOSIS — Z95828 Presence of other vascular implants and grafts: Secondary | ICD-10-CM | POA: Diagnosis not present

## 2017-07-12 DIAGNOSIS — Z96652 Presence of left artificial knee joint: Secondary | ICD-10-CM | POA: Diagnosis present

## 2017-07-12 DIAGNOSIS — R0603 Acute respiratory distress: Secondary | ICD-10-CM | POA: Diagnosis not present

## 2017-07-12 DIAGNOSIS — I5033 Acute on chronic diastolic (congestive) heart failure: Secondary | ICD-10-CM | POA: Diagnosis present

## 2017-07-12 DIAGNOSIS — D631 Anemia in chronic kidney disease: Secondary | ICD-10-CM | POA: Diagnosis present

## 2017-07-12 DIAGNOSIS — Z87891 Personal history of nicotine dependence: Secondary | ICD-10-CM

## 2017-07-12 DIAGNOSIS — R0902 Hypoxemia: Secondary | ICD-10-CM

## 2017-07-12 DIAGNOSIS — Z888 Allergy status to other drugs, medicaments and biological substances status: Secondary | ICD-10-CM

## 2017-07-12 DIAGNOSIS — R05 Cough: Secondary | ICD-10-CM

## 2017-07-12 DIAGNOSIS — Z8249 Family history of ischemic heart disease and other diseases of the circulatory system: Secondary | ICD-10-CM

## 2017-07-12 DIAGNOSIS — Z7901 Long term (current) use of anticoagulants: Secondary | ICD-10-CM

## 2017-07-12 DIAGNOSIS — J9621 Acute and chronic respiratory failure with hypoxia: Secondary | ICD-10-CM | POA: Diagnosis present

## 2017-07-12 DIAGNOSIS — N186 End stage renal disease: Secondary | ICD-10-CM | POA: Diagnosis present

## 2017-07-12 DIAGNOSIS — Z86010 Personal history of colonic polyps: Secondary | ICD-10-CM

## 2017-07-12 DIAGNOSIS — Z86711 Personal history of pulmonary embolism: Secondary | ICD-10-CM

## 2017-07-12 DIAGNOSIS — E785 Hyperlipidemia, unspecified: Secondary | ICD-10-CM | POA: Diagnosis present

## 2017-07-12 DIAGNOSIS — J9811 Atelectasis: Secondary | ICD-10-CM | POA: Diagnosis present

## 2017-07-12 DIAGNOSIS — Z9981 Dependence on supplemental oxygen: Secondary | ICD-10-CM | POA: Diagnosis not present

## 2017-07-12 LAB — PHOSPHORUS: Phosphorus: 3.9 mg/dL (ref 2.5–4.6)

## 2017-07-12 LAB — CBC WITH DIFFERENTIAL/PLATELET
BASOS ABS: 0 10*3/uL (ref 0–0.1)
Basophils Relative: 0 %
Eosinophils Absolute: 0 10*3/uL (ref 0–0.7)
Eosinophils Relative: 0 %
HEMATOCRIT: 34.4 % — AB (ref 35.0–47.0)
HEMOGLOBIN: 11.1 g/dL — AB (ref 12.0–16.0)
LYMPHS PCT: 5 %
Lymphs Abs: 0.3 10*3/uL — ABNORMAL LOW (ref 1.0–3.6)
MCH: 34.5 pg — ABNORMAL HIGH (ref 26.0–34.0)
MCHC: 32.3 g/dL (ref 32.0–36.0)
MCV: 106.8 fL — AB (ref 80.0–100.0)
Monocytes Absolute: 0.5 10*3/uL (ref 0.2–0.9)
Monocytes Relative: 7 %
NEUTROS ABS: 5.9 10*3/uL (ref 1.4–6.5)
Neutrophils Relative %: 88 %
PLATELETS: 137 10*3/uL — AB (ref 150–440)
RBC: 3.22 MIL/uL — AB (ref 3.80–5.20)
RDW: 14.2 % (ref 11.5–14.5)
WBC: 6.8 10*3/uL (ref 3.6–11.0)

## 2017-07-12 LAB — BASIC METABOLIC PANEL
ANION GAP: 17 — AB (ref 5–15)
BUN: 44 mg/dL — ABNORMAL HIGH (ref 6–20)
CHLORIDE: 101 mmol/L (ref 101–111)
CO2: 20 mmol/L — AB (ref 22–32)
Calcium: 7.9 mg/dL — ABNORMAL LOW (ref 8.9–10.3)
Creatinine, Ser: 9.21 mg/dL — ABNORMAL HIGH (ref 0.44–1.00)
GFR calc Af Amer: 4 mL/min — ABNORMAL LOW (ref >60)
GFR, EST NON AFRICAN AMERICAN: 4 mL/min — AB (ref >60)
GLUCOSE: 99 mg/dL (ref 65–99)
POTASSIUM: 4.6 mmol/L (ref 3.5–5.1)
Sodium: 138 mmol/L (ref 135–145)

## 2017-07-12 LAB — MRSA PCR SCREENING: MRSA by PCR: NEGATIVE

## 2017-07-12 LAB — BRAIN NATRIURETIC PEPTIDE: B Natriuretic Peptide: 849 pg/mL — ABNORMAL HIGH (ref 0.0–100.0)

## 2017-07-12 LAB — TROPONIN I: Troponin I: 0.03 ng/mL (ref <0.03)

## 2017-07-12 MED ORDER — METHYLPREDNISOLONE SODIUM SUCC 125 MG IJ SOLR
60.0000 mg | Freq: Four times a day (QID) | INTRAMUSCULAR | Status: DC
  Administered 2017-07-12 – 2017-07-14 (×8): 60 mg via INTRAVENOUS
  Filled 2017-07-12 (×8): qty 2

## 2017-07-12 MED ORDER — SODIUM CHLORIDE 0.9 % IV SOLN: 100.0000 mL | INTRAVENOUS | Status: DC | PRN

## 2017-07-12 MED ORDER — SERTRALINE HCL 50 MG PO TABS
50.0000 mg | ORAL_TABLET | Freq: Every day | ORAL | Status: DC
  Administered 2017-07-12 – 2017-07-14 (×3): 50 mg via ORAL
  Filled 2017-07-12 (×3): qty 1

## 2017-07-12 MED ORDER — TRAZODONE HCL 50 MG PO TABS
50.0000 mg | ORAL_TABLET | Freq: Every day | ORAL | Status: DC
  Administered 2017-07-12 – 2017-07-13 (×2): 50 mg via ORAL
  Filled 2017-07-12 (×2): qty 1

## 2017-07-12 MED ORDER — AZITHROMYCIN 250 MG PO TABS
250.0000 mg | ORAL_TABLET | Freq: Every day | ORAL | Status: DC
  Administered 2017-07-13 – 2017-07-14 (×2): 250 mg via ORAL
  Filled 2017-07-12 (×2): qty 1

## 2017-07-12 MED ORDER — ALPRAZOLAM 0.5 MG PO TABS
0.5000 mg | ORAL_TABLET | Freq: Two times a day (BID) | ORAL | Status: DC | PRN
  Administered 2017-07-12: 0.5 mg via ORAL
  Filled 2017-07-12: qty 1

## 2017-07-12 MED ORDER — ALTEPLASE 2 MG IJ SOLR: 2.0000 mg | Freq: Once | INTRAMUSCULAR | Status: DC | PRN

## 2017-07-12 MED ORDER — DIPHENHYDRAMINE HCL 25 MG PO CAPS
25.0000 mg | ORAL_CAPSULE | Freq: Four times a day (QID) | ORAL | Status: DC | PRN
  Administered 2017-07-12 – 2017-07-13 (×2): 25 mg via ORAL
  Filled 2017-07-12 (×2): qty 1

## 2017-07-12 MED ORDER — ADULT MULTIVITAMIN W/MINERALS CH
1.0000 | ORAL_TABLET | Freq: Every day | ORAL | Status: DC
  Administered 2017-07-12 – 2017-07-14 (×3): 1 via ORAL
  Filled 2017-07-12 (×3): qty 1

## 2017-07-12 MED ORDER — GUAIFENESIN-CODEINE 100-10 MG/5ML PO SOLN
5.0000 mL | Freq: Four times a day (QID) | ORAL | Status: DC | PRN
  Administered 2017-07-12 – 2017-07-14 (×4): 5 mL via ORAL
  Filled 2017-07-12 (×4): qty 5

## 2017-07-12 MED ORDER — ONDANSETRON HCL 4 MG PO TABS: 4.0000 mg | ORAL_TABLET | Freq: Four times a day (QID) | ORAL | Status: DC | PRN

## 2017-07-12 MED ORDER — FAMOTIDINE 20 MG PO TABS
20.0000 mg | ORAL_TABLET | Freq: Every evening | ORAL | Status: DC
  Administered 2017-07-12 – 2017-07-14 (×3): 20 mg via ORAL
  Filled 2017-07-12 (×5): qty 1

## 2017-07-12 MED ORDER — FAMOTIDINE 20 MG PO TABS: 40.0000 mg | ORAL_TABLET | Freq: Every evening | ORAL | Status: DC

## 2017-07-12 MED ORDER — ALBUTEROL SULFATE (2.5 MG/3ML) 0.083% IN NEBU: 2.5000 mg | INHALATION_SOLUTION | RESPIRATORY_TRACT | Status: DC | PRN

## 2017-07-12 MED ORDER — PENTAFLUOROPROP-TETRAFLUOROETH EX AERO
1.0000 "application " | INHALATION_SPRAY | CUTANEOUS | Status: DC | PRN
  Filled 2017-07-12: qty 30

## 2017-07-12 MED ORDER — HEPARIN SODIUM (PORCINE) 1000 UNIT/ML DIALYSIS: 1000.0000 [IU] | INTRAMUSCULAR | Status: DC | PRN

## 2017-07-12 MED ORDER — LIDOCAINE HCL (PF) 1 % IJ SOLN
5.0000 mL | INTRAMUSCULAR | Status: DC | PRN
  Filled 2017-07-12: qty 5

## 2017-07-12 MED ORDER — PANTOPRAZOLE SODIUM 40 MG PO TBEC
40.0000 mg | DELAYED_RELEASE_TABLET | Freq: Every day | ORAL | Status: DC
  Administered 2017-07-12 – 2017-07-14 (×3): 40 mg via ORAL
  Filled 2017-07-12 (×3): qty 1

## 2017-07-12 MED ORDER — IPRATROPIUM-ALBUTEROL 0.5-2.5 (3) MG/3ML IN SOLN
3.0000 mL | Freq: Once | RESPIRATORY_TRACT | Status: AC
  Administered 2017-07-12: 3 mL via RESPIRATORY_TRACT
  Filled 2017-07-12: qty 3

## 2017-07-12 MED ORDER — DOCUSATE SODIUM 100 MG PO CAPS
100.0000 mg | ORAL_CAPSULE | Freq: Two times a day (BID) | ORAL | Status: DC
  Administered 2017-07-13 (×2): 100 mg via ORAL
  Filled 2017-07-12 (×3): qty 1

## 2017-07-12 MED ORDER — ALBUTEROL SULFATE HFA 108 (90 BASE) MCG/ACT IN AERS: 2.0000 | INHALATION_SPRAY | RESPIRATORY_TRACT | Status: DC | PRN

## 2017-07-12 MED ORDER — ACETAMINOPHEN 500 MG PO TABS: 1000.0000 mg | ORAL_TABLET | Freq: Four times a day (QID) | ORAL | Status: DC | PRN

## 2017-07-12 MED ORDER — ATORVASTATIN CALCIUM 20 MG PO TABS
40.0000 mg | ORAL_TABLET | Freq: Every day | ORAL | Status: DC
  Administered 2017-07-13: 40 mg via ORAL
  Filled 2017-07-12 (×2): qty 2

## 2017-07-12 MED ORDER — CLOPIDOGREL BISULFATE 75 MG PO TABS
75.0000 mg | ORAL_TABLET | Freq: Every day | ORAL | Status: DC
  Administered 2017-07-12 – 2017-07-14 (×3): 75 mg via ORAL
  Filled 2017-07-12 (×3): qty 1

## 2017-07-12 MED ORDER — CALCIUM ACETATE (PHOS BINDER) 667 MG PO CAPS
1334.0000 mg | ORAL_CAPSULE | Freq: Three times a day (TID) | ORAL | Status: DC
  Administered 2017-07-12 – 2017-07-14 (×6): 1334 mg via ORAL
  Filled 2017-07-12 (×6): qty 2

## 2017-07-12 MED ORDER — ONDANSETRON HCL 4 MG/2ML IJ SOLN: 4.0000 mg | Freq: Four times a day (QID) | INTRAMUSCULAR | Status: DC | PRN

## 2017-07-12 MED ORDER — AZITHROMYCIN 250 MG PO TABS
500.0000 mg | ORAL_TABLET | Freq: Every day | ORAL | Status: AC
  Administered 2017-07-12: 500 mg via ORAL
  Filled 2017-07-12: qty 2

## 2017-07-12 MED ORDER — METHYLPREDNISOLONE SODIUM SUCC 125 MG IJ SOLR
125.0000 mg | Freq: Once | INTRAMUSCULAR | Status: AC
  Administered 2017-07-12: 125 mg via INTRAVENOUS
  Filled 2017-07-12: qty 2

## 2017-07-12 MED ORDER — LIDOCAINE-PRILOCAINE 2.5-2.5 % EX CREA
1.0000 "application " | TOPICAL_CREAM | CUTANEOUS | Status: DC | PRN
  Filled 2017-07-12: qty 5

## 2017-07-12 MED ORDER — IPRATROPIUM-ALBUTEROL 0.5-2.5 (3) MG/3ML IN SOLN
3.0000 mL | Freq: Four times a day (QID) | RESPIRATORY_TRACT | Status: DC
  Administered 2017-07-12 – 2017-07-14 (×9): 3 mL via RESPIRATORY_TRACT
  Filled 2017-07-12 (×9): qty 3

## 2017-07-12 MED ORDER — APIXABAN 2.5 MG PO TABS
2.5000 mg | ORAL_TABLET | Freq: Two times a day (BID) | ORAL | Status: DC
  Administered 2017-07-12 – 2017-07-13 (×4): 2.5 mg via ORAL
  Filled 2017-07-12 (×3): qty 1

## 2017-07-12 MED ORDER — GUAIFENESIN-DM 100-10 MG/5ML PO SYRP
5.0000 mL | ORAL_SOLUTION | ORAL | Status: DC | PRN
  Administered 2017-07-12: 5 mL via ORAL
  Filled 2017-07-12: qty 5

## 2017-07-12 MED ORDER — PREDNISONE 20 MG PO TABS: 20.0000 mg | ORAL_TABLET | Freq: Every day | ORAL | Status: DC

## 2017-07-12 MED ORDER — TRAMADOL HCL 50 MG PO TABS: 25.0000 mg | ORAL_TABLET | Freq: Two times a day (BID) | ORAL | Status: DC | PRN

## 2017-07-12 MED ORDER — CALCIUM ACETATE (PHOS BINDER) 667 MG PO CAPS: 1334.0000 mg | ORAL_CAPSULE | Freq: Two times a day (BID) | ORAL | Status: DC | PRN

## 2017-07-12 MED ORDER — ORAL CARE MOUTH RINSE
15.0000 mL | Freq: Two times a day (BID) | OROMUCOSAL | Status: DC
  Administered 2017-07-12 – 2017-07-14 (×3): 15 mL via OROMUCOSAL

## 2017-07-12 NOTE — Progress Notes (Signed)
[facility]  ROUNDING NOTE   Subjective:  Patient well-known to [place] as we follow her for outpatient hemodialysis. She presents now with cough and significant shortness of breath. She did go to her normally scheduled dialysis on Friday. She was preparing to go to dialysis this morning however the shortness of breath stopped her from going.   Objective:  Vital signs in last 24 hours:  Temp:  [98.3 F (36.8 C)-98.4 F (36.9 C)] 98.4 F (36.9 C) (03/25 0948) Pulse Rate:  [65-97] 65 (03/25 0948) Resp:  [11-24] 18 (03/25 0948) BP: (136-175)/(71-108) 136/74 (03/25 0948) SpO2:  [84 %-98 %] 90 % (03/25 0948) Weight:  [103.3 kg (227 lb 12.8 oz)] 103.3 kg (227 lb 12.8 oz) (03/25 0948)  Weight change:  Filed Weights   07/12/17 0948  Weight: 103.3 kg (227 lb 12.8 oz)    Intake/Output: No intake/output data recorded.   Intake/Output this shift:  No intake/output data recorded.  Physical Exam: General: No acute distress  Head: Normocephalic, atraumatic. Moist oral mucosal membranes  Eyes: Anicteric  Neck: Supple, trachea midline  Lungs:  Scattered rhonchi and wheezes, normal effort  Heart: S1S2 no rubs  Abdomen:  Soft, nontender, bowel sounds present  Extremities: 1+ peripheral edema.  Neurologic: Awake, alert, following commands  Skin: No lesions  Access: LUE AVF    Basic Metabolic Panel: Recent Labs  Lab 07/12/17 0636  NA 138  K 4.6  CL 101  CO2 20*  GLUCOSE 99  BUN 44*  CREATININE 9.21*  CALCIUM 7.9*    Liver Function Tests: No results for input(s): AST, ALT, ALKPHOS, BILITOT, PROT, ALBUMIN in the last 168 hours. No results for input(s): LIPASE, AMYLASE in the last 168 hours. No results for input(s): AMMONIA in the last 168 hours.  CBC: Recent Labs  Lab 07/12/17 0636  WBC 6.8  NEUTROABS 5.9  HGB 11.1*  HCT 34.4*  MCV 106.8*  PLT 137*    Cardiac Enzymes: Recent Labs  Lab 07/12/17 0636  TROPONINI 0.03*    BNP: Invalid input(s):  POCBNP  CBG: No results for input(s): GLUCAP in the last 168 hours.  Microbiology: Results for orders placed or performed during the hospital encounter of 07/12/17  MRSA PCR Screening     Status: None   Collection Time: 07/12/17 10:09 AM  Result Value Ref Range Status   MRSA by PCR NEGATIVE NEGATIVE Final    Comment:        The GeneXpert MRSA Assay (FDA approved for NASAL specimens only), is one component of a comprehensive MRSA colonization surveillance program. It is not intended to diagnose MRSA infection nor to guide or monitor treatment for MRSA infections. Performed at [facility], [address] [place], [place] [address]     Coagulation Studies: No results for input(s): LABPROT, INR in the last 72 hours.  Urinalysis: No results for input(s): COLORURINE, LABSPEC, PHURINE, GLUCOSEU, HGBUR, BILIRUBINUR, KETONESUR, PROTEINUR, UROBILINOGEN, NITRITE, LEUKOCYTESUR in the last 72 hours.  Invalid input(s): APPERANCEUR    Imaging: Dg Chest Portable 1 View  Result Date: 07/12/2017 CLINICAL DATA:  Cough EXAM: PORTABLE CHEST 1 VIEW COMPARISON:  03/04/2017 FINDINGS: Cardiac shadow is stable. Changes of prior TAVR are noted. The lungs are clear bilaterally. No acute bony abnormality is seen. IMPRESSION: No active disease. Electronically Signed   By: Alcide Clever M.D.   On: 07/12/2017 08:02     Medications:    . apixaban  2.5 mg Oral BID  . atorvastatin  40 mg  Oral q1800  . azithromycin  500 mg Oral Daily   Followed by  . [START ON 07/13/2017] azithromycin  250 mg Oral Daily  . calcium acetate  1,334 mg Oral TID WC  . clopidogrel  75 mg Oral Daily  . docusate sodium  100 mg Oral BID  . famotidine  40 mg Oral QPM  . ipratropium-albuterol  3 mL Nebulization Q6H  . mouth rinse  15 mL Mouth Rinse BID  . methylPREDNISolone (SOLU-MEDROL) injection  60 mg Intravenous Q6H  . multivitamin with minerals  1 tablet Oral Daily  . pantoprazole  40 mg Oral Daily  .  sertraline  50 mg Oral Daily  . traZODone  50 mg Oral QHS   acetaminophen, albuterol, ALPRAZolam, calcium acetate, diphenhydrAMINE, guaiFENesin-dextromethorphan, lidocaine-prilocaine, ondansetron **OR** ondansetron (ZOFRAN) IV, traMADol  Assessment/ Plan:  72 y.o. female with chronic diastolic congestive heart failure, COPD requiring oxygen supplementation by nasal cannula, end-stage renal disease, GERD, history of GI bleed, history of DVT in the leg, hypertension, history of transcatheter aortic valve replacement for severe aortic stenosis  [facility]/TTS/Dr. Kolluru/ LUE AVF  1.  End-stage renal disease Due for hemodialysis today.  Orders have been prepared.  2.  Anemia of chronic kidney disease Hemoglobin currently 11.1.  Hold off on Epogen.  3.  Secondary hyperparathyroidism Continue calcium acetate and follow-up serum phosphorus.  4.  Shortness of breath/chronic persistent cough.  Patient had an acute episode of shortness of breath this a.m.  She did receive nebulizer treatment and is feeling better.  She has been started on Solu-Medrol.  Hematology consulted as well.      LOS: 0 Ariel Smith 3/25/20191:09 PM

## 2017-07-12 NOTE — ED Provider Notes (Signed)
-----------------------------------------   8:41 AM on 07/12/2017 -----------------------------------------  I took over care of this patient from Dr. Manson Passey.  Patient's workup reveals no significant acute findings on chest x-ray, and only minimally elevated BNP.  However patient's daughter states that she is significantly improved after albuterol nebs.  Patient has been told she does not have COPD, although her clinical picture is consistent with possible COPD versus acute bronchitis.    Given patient's hypoxia even when she was on O2, as well as the fact that she needs dialysis today, she is appropriate for admission.  I signed the patient out to the hospitalist.     Dionne Bucy, MD 07/12/17 [phone]

## 2017-07-12 NOTE — ED Triage Notes (Signed)
Reports cough for several days.  Today when woke up coughed until vomiting.  Also reports pain in back when coughing.

## 2017-07-12 NOTE — Telephone Encounter (Signed)
Pt daughter would like to talk with dr simonds regarding a nebulizer, per her conversation with [name]. Pt is currently inpatient. Please call.

## 2017-07-12 NOTE — Progress Notes (Signed)
Post HD assessment   07/12/17 1731  Neurological  Level of Consciousness Alert  Orientation Level Oriented X4  Respiratory  Respiratory Pattern Labored;Dyspnea with exertion;Dyspnea at rest;Tachypnea  Chest Assessment Chest expansion symmetrical  Cardiac  Pulse Irregular  ECG Monitor Yes  Cardiac Rhythm ST  Vascular  R Radial Pulse +2  L Radial Pulse +2  Edema Generalized  Integumentary  Integumentary (WDL) X  Skin Color Appropriate for ethnicity  Musculoskeletal  Musculoskeletal (WDL) X  Generalized Weakness Yes  Assistive Device None  GU Assessment  Genitourinary (WDL) X  Genitourinary Symptoms  (HD)  Psychosocial  Psychosocial (WDL) WDL

## 2017-07-12 NOTE — Progress Notes (Signed)
[facility] consult with patient. Patient's daughter will return soon. Patient would like HCPOA education when daughter is available. Patient came in and stated she has issues with coughing and will need dialysis. Patient lost her spouse of 26 years last year. Patient stated she does not want to go through this again as she is tired. [facility] provided ministry of presence, emotional support, and education.

## 2017-07-12 NOTE — Progress Notes (Signed)
Patient admitted to unit. Oriented to room, call bell, and staff. Bed in lowest position. Fall safety plan reviewed. Full assessment to Epic. Skin assessment verified with Dellia Nims RN. Telemetry box verification with tele clerk- Box#: [id number]. Will continue to monitor.

## 2017-07-12 NOTE — Telephone Encounter (Signed)
To Dr. Gollan to review.  

## 2017-07-12 NOTE — Progress Notes (Signed)
   07/12/17 1530  Clinical Encounter Type  Visited With Family  Visit Type Follow-up  Referral From Nurse  Consult/Referral To Chaplain  Spiritual Encounters  Spiritual Needs Other (Comment)   CH received a PG to report to PT's RM. PT's daughter had question about AD. CH answered questions and will complete AD when PT is ready.

## 2017-07-12 NOTE — ED Notes (Signed)
Revonda Standard, rn without success iv insertion attempt. Elon Jester, rn in to attempt iv initiation.

## 2017-07-12 NOTE — Telephone Encounter (Signed)
I talked to her on the phone Sounds like bad bronchospasm that may have turned around with nebulizer Now on steroids They are going to keep her in the hospital for hemodialysis and breathing treatments Daughter interested in having nebulizer at home as this came on quickly Will forward to Dr. Sung Amabile to see if he agrees

## 2017-07-12 NOTE — Progress Notes (Signed)
Talked to Dr. Amado Coe about patient's complaints of coughing, patient in dialysis, unable to complete treatment, robitussin not working order for guiafenesin with codeine. No other concern at the moment. RN will continue to monitor.

## 2017-07-12 NOTE — Progress Notes (Signed)
HD tx end    07/12/17 1715  Vital Signs  Pulse Rate (!) 105  Pulse Rate Source Monitor  Resp (!) 23  BP (!) 149/82  BP Location Right Arm  BP Method Automatic  Patient Position (if appropriate) Lying  Oxygen Therapy  SpO2 94 %  O2 Device Nasal Cannula  O2 Flow Rate (L/min) 3 L/min  During Hemodialysis Assessment  Dialysis Fluid Bolus Normal Saline  Bolus Amount (mL) 250 mL  Intra-Hemodialysis Comments Tx completed

## 2017-07-12 NOTE — ED Notes (Signed)
Attempt iv insertion to right ac without success. Revonda Standard, rn in to attempt iv insertion.

## 2017-07-12 NOTE — Telephone Encounter (Signed)
Spoke with patients daughter and she reports that she spoke with [name] earlier. Advised that I would send this message over to pulmonary and she verbalized understanding with no further questions at this time.

## 2017-07-12 NOTE — Progress Notes (Signed)
Pharmacy Note: Renal adjustment  72 yo F ordered Famotidine 40 mg po daily. Hemodialysis patient.  Will adjust Famotidine to 20 mg PO daily.  Bari Mantis PharmD Clinical Pharmacist 07/12/2017

## 2017-07-12 NOTE — Progress Notes (Signed)
   07/12/17 1150  Clinical Encounter Type  Visited With Patient and family together  Visit Type Initial  Referral From Nurse  Consult/Referral To Chaplain  Spiritual Encounters  Spiritual Needs Other (Comment)   CH received PG to report to PT's RM to educate PT on an AD. CH spoke with PT and her daughter and will return when AD is filled out and ready to be notarized.

## 2017-07-12 NOTE — ED Notes (Signed)
Report to kailey, rn.  

## 2017-07-12 NOTE — ED Provider Notes (Signed)
[facility] Emergency Department Provider Note    First MD Initiated Contact with Patient 07/12/17 [phone]     (approximate)  I have reviewed the triage vital signs and the nursing notes.   HISTORY  Chief Complaint Cough    HPI Ariel Smith is a 72 y.o. female with below list of chronic medical conditions including CHF, end-stage renal disease with dialysis schedule Monday Wednesday Friday Presents to the emergency department with history of chronic cough of unknown etiology with acute onset of coughing this morning followed by vomiting.  Patient admits to back discomfort with coughing.  Patient denies any fever.  Patient denies any lower extremity pain or swelling.   Past Medical History:  Diagnosis Date  . Anemia of chronic disease   . Bronchiectasis (HCC) 08/2014    suggested by thoracic xray  . Chronic diastolic CHF (congestive heart failure) (HCC)    a. echo 10/2014: EF 60-65%, no RWMA, GR1DD; b. 03/2015 Echo: EF 60-65%, no rwma, Gr2 DD.  Marland Kitchen Chronic respiratory failure (HCC)    a. 2/2 COPD; b. on 4-5L via nasal cannula  . COPD (chronic obstructive pulmonary disease) (HCC)   . Depression   . ESRD (end stage renal disease) (HCC) 08/2011   a. on HD (TThSa), L forearm AV fistula, [name]  . Frequent headaches   . GERD (gastroesophageal reflux disease)   . GIB (gastrointestinal bleeding)    a. leading to cessation of warfarin 11/2014  . History of cardiac cath    a. 12/2014 Cath: nl cors. 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  Marland Kitchen History of colon polyps 2013   colonoscopy ([name])  . History of DVT of lower extremity 2009, 2017   a. left sided x2, with PE s/p IVC filter placement, coumadin stopped 2/2 GI bleed in 2016; b. 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  Marland Kitchen HLD (hyperlipidemia)   . HTN (hypertension)   . Morbid obesity (HCC)   . Osteoarthritis   . Osteopenia 01/2013  . Pulmonary embolism (HCC) 2009  . Secondary hyperparathyroidism of  renal origin (HCC)   . Severe aortic stenosis    a. echo 10/2014: mod to sev AS; b. 02/2015 TAVR (Owen/Cooper): Edwards Sapien 3 THV (size 23mm, model #9600TFX, ser# [id number]); c. 03/2015 Echo: EF 60-65%, Gr2 DD, m ild to mod AS (peak grad , mean ).    Patient Active Problem List   Diagnosis Date Noted  . Epidermal cyst of neck 06/23/2017  . Epigastric discomfort 06/15/2017  . Lumbar back pain 06/15/2017  . History of ischemic right MCA stroke 05/24/2017  . 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 Bilateral hearing loss 03/25/2017  . Breast mass, left 03/21/2017  . Bradycardia 03/21/2017  . Altered mental status 03/04/2017  . Mass of chest wall, right 04/09/2016  . 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 Insomnia 04/08/2015  . 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 Chronic respiratory failure (HCC)   . Anemia of chronic disease   . Chronic diastolic CHF (congestive heart failure) (HCC)   . Obesity, Class II, BMI 35-39.9, with comorbidity 01/07/2015  . COPD (chronic obstructive pulmonary disease) (HCC) 12/20/2014  . DVT (deep venous thrombosis) (HCC) 11/16/2014  . Aortic valvar stenosis 11/16/2014  . Vitamin D deficiency 11/03/2014  . Memory deficit 11/01/2014  . Right-sided thoracic back pain 08/17/2014  . Advanced care planning/counseling discussion 03/30/2014  . Health maintenance examination 03/30/2014  . Vitamin B12  deficiency 01/27/2014  . Medicare annual wellness visit, subsequent 01/27/2013  . Osteopenia 01/18/2013  . Chronic cough 10/22/2012  . Osteoarthritis   . MDD (major depressive disorder), recurrent episode, moderate (HCC)   . HTN (hypertension)   . HLD (hyperlipidemia)   . GERD (gastroesophageal reflux disease)   . End stage renal disease (HCC) 08/19/2011    Past Surgical History:  Procedure Laterality Date  . A/V FISTULAGRAM Left 07/09/2016   Procedure: A/V Fistulagram;  Surgeon: [name],  MD;  Location: [facility] INVASIVE CV LAB;  Service: Cardiovascular;  Laterality: Left;  . BUNIONECTOMY Left 2003  . CARDIAC CATHETERIZATION N/A 01/18/2015   patent coronary arteries without significant osbtruction and preserved LV function, severe aortic stenosis; Procedure: Right/Left Heart Cath and Coronary Angiography;  Surgeon: Tonny Bollman, MD  . CHOLECYSTECTOMY  2012  . COLONOSCOPY  08/2011   colon biopsies, [name]  . ESOPHAGOGASTRODUODENOSCOPY  08/2011   gastric cardia polyp  . ESOPHAGOGASTRODUODENOSCOPY Left 11/11/2014   Procedure: ESOPHAGOGASTRODUODENOSCOPY (EGD);  Surgeon: [name], MD;  Location: [facility] ENDOSCOPY;  Service: Endoscopy;  Laterality: Left;  . EXTERIORIZATION OF A CONTINUOUS AMBULATORY PERITONEAL DIALYSIS CATHETER  01/2013   removal 12/2103 - [name]  . hospitalization  12/2013   recurrent R pleural effusion due to peritoneal fluid translocation s/p rpt thoracentesis with 1.3 L fluid removed, ERSD started on HD this hospitalization  . PERIPHERAL VASCULAR CATHETERIZATION N/A 11/12/2014   Procedure: IVC Filter Insertion;  Surgeon: [name], MD;  Location: [facility] INVASIVE CV LAB;  Service: Cardiovascular;  Laterality: N/A;  . PERIPHERAL VASCULAR CATHETERIZATION Left 10/24/2015   Procedure: Lower Extremity Venography with intervention (thormbolysis/thrombectomy);  Surgeon: [name], MD;  Location: [facility] INVASIVE CV LAB;  Service: Cardiovascular;  Laterality: Left;  . REPLACEMENT TOTAL KNEE Left 2006  . SHOULDER ARTHROSCOPY Right 2009  . TEE WITHOUT CARDIOVERSION N/A 02/19/2015   Procedure: TRANSESOPHAGEAL ECHOCARDIOGRAM (TEE);  Surgeon: Tonny Bollman, MD;  Location: [facility] OR;  Service: Open Heart Surgery;  Laterality: N/A;  . TONSILLECTOMY  1955  . TRANSCATHETER AORTIC VALVE REPLACEMENT, TRANSFEMORAL Right 02/19/2015   Procedure: TRANSCATHETER AORTIC VALVE REPLACEMENT, TRANSFEMORAL;  Surgeon: Tonny Bollman, MD;  Location: [facility] OR;  Service: Open Heart Surgery;  Laterality: Right;    . TUBAL LIGATION  1980  . US ECHOCARDIOGRAPHY  12/2013   EF 55-60%, nl LV sys fxn, mild-mod MR, AS, increased LV posterior wall thickness, mild TR    Prior to Admission medications   Medication Sig Start Date End Date Taking? Authorizing Provider  acetaminophen (TYLENOL) 500 MG tablet Take 1,000 mg by mouth every 6 (six) hours as needed for mild pain or headache.     [provider]  albuterol (VENTOLIN HFA) 108 (90 BASE) MCG/ACT inhaler Inhale 2 puffs into the lungs every 4 (four) hours as needed for wheezing or shortness of breath. 04/08/15   [name], MD  ALPRAZolam (NIRAVAM) 0.5 MG dissolvable tablet TAKE ONE-HALF TO ONE TABLET BY MOUTH ONCE DAILY AS NEEDED FOR ANXIETY 07/07/17   [name], MD  atorvastatin (LIPITOR) 40 MG tablet TAKE 1 TABLET BY MOUTH ONCE DAILY AT  6PM. NEEDS OFFICE VISIT. 06/25/17   [name], MD  calcium acetate (PHOSLO) 667 MG capsule Take [phone] mg 3 (three) times daily with meals by mouth. Take 2 with meals and 1 with a snack    [provider]  clopidogrel (PLAVIX) 75 MG tablet TAKE ONE TABLET BY MOUTH ONCE DAILY WITH BREAKFAST 08/06/16   Gollan,  Tollie Pizza, MD  diphenhydrAMINE (BENADRYL) 25 MG tablet Take 25 mg by mouth every 6 (six) hours as needed.    [provider]  doxycycline (VIBRA-TABS) 100 MG tablet Take 1 tablet (100 mg total) by mouth 2 (two) times daily. 06/23/17   [name], MD  ELIQUIS 2.5 MG TABS tablet TAKE 1 TABLET BY MOUTH TWICE DAILY 05/28/17   [name], MD  famotidine (PEPCID) 40 MG tablet TAKE 1 TABLET BY MOUTH IN THE EVENING 05/04/17   Merwyn Katos, MD  lidocaine-prilocaine (EMLA) cream Apply 1 application topically as needed (topical anesthesia for hemodialysis if Gebauers and Lidocaine injection are ineffective.). 11/22/14   Auburn Bilberry, MD  Multiple Vitamin (MULTIVITAMIN WITH MINERALS) TABS tablet Take 1 tablet by mouth daily.    [provider]  pantoprazole  (PROTONIX) 40 MG tablet TAKE 1 TABLET BY MOUTH ONCE DAILY AS NEEDED 03/22/17   [name], MD  sertraline (ZOLOFT) 50 MG tablet Take 1 tablet (50 mg total) by mouth daily. 06/15/17   [name], MD  traMADol (ULTRAM) 50 MG tablet Take 0.5-1 tablets (25-50 mg total) by mouth 2 (two) times daily as needed. 06/15/17   [name], MD  traZODone (DESYREL) 50 MG tablet TAKE 1 & 1/2 (ONE & ONE-HALF) TABLETS BY MOUTH AT BEDTIME 05/17/17   [name], MD    Allergies Nodolor [isometheptene-dichloral-apap]  Family History  Problem Relation Age of Onset  . Deep vein thrombosis Brother   . Cancer Mother 17       colon  . Stroke Mother   . Cancer Father        prostate  . Hypertension Father   . Dementia Father 55  . Hypertension Brother   . Breast cancer Daughter 66  . Diabetes Neg Hx   . CAD Neg Hx     Social History Social History   Tobacco Use  . Smoking status: Former Smoker    Packs/day: 2.00    Years: 30.00    Pack years: 60.00    Types: Cigarettes    Start date: 04/20/1978    Last attempt to quit: 04/21/2007    Years since quitting: 10.2  . Smokeless tobacco: Never Used  Substance Use Topics  . Alcohol use: No  . Drug use: No    Review of Systems Constitutional: No fever/chills Eyes: No visual changes. ENT: No sore throat. Cardiovascular: Denies chest pain. Respiratory: Denies shortness of breath.  Positive for cough Gastrointestinal: No abdominal pain.  No nausea, no vomiting.  No diarrhea.  No constipation. Genitourinary: Negative for dysuria. Musculoskeletal: Negative for neck pain.  Positive for upper back pain  integumentary: Negative for rash. Neurological: Negative for headaches, focal weakness or numbness.   ____________________________________________   PHYSICAL EXAM:  VITAL SIGNS: ED Triage Vitals  Enc Vitals Group     BP 07/12/17 0616 (!) 142/71     Pulse Rate 07/12/17 0616 97     Resp 07/12/17 0616 (!) 24     Temp 07/12/17  0616 98.3 F (36.8 C)     Temp Source 07/12/17 0616 Oral     SpO2 07/12/17 0616 (!) 84 %     Weight --      Height 07/12/17 0613 1.626 m (5\' 4" )     Head Circumference --      Peak Flow --      Pain Score --      Pain Loc --      Pain Edu? --  Excl. in GC? --     Constitutional: Alert and oriented.  Actively coughing  eyes: Conjunctivae are normal.  Head: Atraumatic. Mouth/Throat: Mucous membranes are moist. Oropharynx non-erythematous. Neck: No stridor.   Cardiovascular: Normal rate, regular rhythm. Good peripheral circulation. Grossly normal heart sounds. Respiratory: Normal respiratory effort.  Diffuse bilateral rhonchi with expiratory wheezing Gastrointestinal: Soft and nontender. No distention.  Musculoskeletal: No lower extremity tenderness nor edema. No gross deformities of extremities. Neurologic:  Normal speech and language. No gross focal neurologic deficits are appreciated.  Skin:  Skin is warm, dry and intact. No rash noted.   ____________________________________________   LABS (all labs ordered are listed, but only abnormal results are displayed)  Labs Reviewed  CBC WITH DIFFERENTIAL/PLATELET - Abnormal; Notable for the following components:      Result Value   RBC 3.22 (*)    Hemoglobin 11.1 (*)    HCT 34.4 (*)    MCV 106.8 (*)    MCH 34.5 (*)    Platelets 137 (*)    Lymphs Abs 0.3 (*)    All other components within normal limits  BASIC METABOLIC PANEL - Abnormal; Notable for the following components:   CO2 20 (*)    BUN 44 (*)    Creatinine, Ser 9.21 (*)    Calcium 7.9 (*)    GFR calc non Af Amer 4 (*)    GFR calc Af Amer 4 (*)    Anion gap 17 (*)    All other components within normal limits  BRAIN NATRIURETIC PEPTIDE - Abnormal; Notable for the following components:   B Natriuretic Peptide 849.0 (*)    All other components within normal limits  TROPONIN I - Abnormal; Notable for the following components:   Troponin I 0.03 (*)    All other  components within normal limits    Procedures   ____________________________________________   INITIAL IMPRESSION / ASSESSMENT AND PLAN / ED COURSE  As part of my medical decision making, I reviewed the following data within the electronic MEDICAL RECORD NUMBER   72 year old female present with above-stated history and physical exam of chronic cough with acute worsening tonight.  Patient given 2 DuoNeb's in the emergency department.  Consider possibility of cardiac etiology for chronic cough including CHF exacerbation and as such BNP obtained chest x-ray ordered and pending at this time.  Patient's care transferred to Dr. Marisa Severin    ____________________________________________  FINAL CLINICAL IMPRESSION(S) / ED DIAGNOSES  Final diagnoses:  Cough     MEDICATIONS GIVEN DURING THIS VISIT:  Medications  ipratropium-albuterol (DUONEB) 0.5-2.5 (3) MG/3ML nebulizer solution 3 mL (3 mLs Nebulization Given 07/12/17 0639)  ipratropium-albuterol (DUONEB) 0.5-2.5 (3) MG/3ML nebulizer solution 3 mL (3 mLs Nebulization Given 07/12/17 1610)     ED Discharge Orders    None       Note:  This document was prepared using Dragon voice recognition software and may include unintentional dictation errors.    Darci Current, MD 07/12/17 [phone]

## 2017-07-12 NOTE — Progress Notes (Signed)
Post HD assessment. Pt tolerated tx well without c/o or complications. Pt experienced a brief period of ST during hd tx. Pt has a continuous, uncontrollable, cough and sneeze.  MD made aware. Net UF 3 L, goal met.    07/12/17 1730  Vital Signs  Temp 98.7 F (37.1 C)  Temp Source Oral  Pulse Rate (!) 109  Pulse Rate Source Monitor  Resp (!) 21  BP (!) 151/88  BP Location Right Arm  BP Method Automatic  Patient Position (if appropriate) Lying  Oxygen Therapy  SpO2 96 %  O2 Device Nasal Cannula  O2 Flow Rate (L/min) 3 L/min  Dialysis Weight  Weight 103 kg (227 lb 1.2 oz)  Type of Weight Post-Dialysis  Post-Hemodialysis Assessment  Rinseback Volume (mL) 250 mL  KECN 65.6 V  Dialyzer Clearance Lightly streaked  Duration of HD Treatment -hour(s) 3.5 hour(s)  Hemodialysis Intake (mL) 500 mL  UF Total -Machine (mL) 3500 mL  Net UF (mL) 3000 mL  Tolerated HD Treatment Yes  AVG/AVF Arterial Site Held (minutes) 15 minutes  AVG/AVF Venous Site Held (minutes) 10 minutes  Education / Care Plan  Dialysis Education Provided Yes  Documented Education in Care Plan Yes

## 2017-07-12 NOTE — H&P (Signed)
[facility] - [place] at [facility]   PATIENT NAME: Ariel Smith    MR#:  [id number]  DATE OF BIRTH:  June 29, 1945  DATE OF ADMISSION:  07/12/2017  PRIMARY CARE PHYSICIAN: [name], MD   REQUESTING/REFERRING PHYSICIAN: Dionne Bucy, MD  CHIEF COMPLAINT:   sob HISTORY OF PRESENT ILLNESS:  Ariel Smith  is a 72 y.o. female with a known history of chronic diastolic CHF, end-stage renal disease, 80-pack-year history of smoking in the past, hyperlipidemia and other medical problems is presenting to the ED with a chief complaint of worsening of shortness of breath.  Patient reports that shortness of breath has been started on last Friday and slowly has been getting worse.  Today morning she could not breathe and came into the emergency department.  Chest x-ray looks fine and only minimally elevated BNP.  Patient was very tight and started feeling better after breathing treatments and patient was given Solu-Medrol.  Reporting cough.  Daughter at bedside.  Denies any chest pain.  PAST MEDICAL HISTORY:   Past Medical History:  Diagnosis Date  . Anemia of chronic disease   . Bronchiectasis (HCC) 08/2014    suggested by thoracic xray  . Chronic diastolic CHF (congestive heart failure) (HCC)    a. echo 10/2014: EF 60-65%, no RWMA, GR1DD; b. 03/2015 Echo: EF 60-65%, no rwma, Gr2 DD.  Marland Kitchen Chronic respiratory failure (HCC)    a. 2/2 COPD; b. on 4-5L via nasal cannula  . COPD (chronic obstructive pulmonary disease) (HCC)   . Depression   . ESRD (end stage renal disease) (HCC) 08/2011   a. on HD (TThSa), L forearm AV fistula, [name]  . Frequent headaches   . GERD (gastroesophageal reflux disease)   . GIB (gastrointestinal bleeding)    a. leading to cessation of warfarin 11/2014  . History of cardiac cath    a. 12/2014 Cath: nl cors. 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  Marland Kitchen History of colon polyps 2013   colonoscopy ([name])  . History of DVT of lower  extremity 2009, 2017   a. left sided x2, with PE s/p IVC filter placement, coumadin stopped 2/2 GI bleed in 2016; b. 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  Marland Kitchen HLD (hyperlipidemia)   . HTN (hypertension)   . Morbid obesity (HCC)   . Osteoarthritis   . Osteopenia 01/2013  . Pulmonary embolism (HCC) 2009  . Secondary hyperparathyroidism of renal origin (HCC)   . Severe aortic stenosis    a. echo 10/2014: mod to sev AS; b. 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 03/2015 Echo: EF 60-65%, Gr2 DD, m ild to mod AS (peak grad , mean ).    PAST SURGICAL HISTOIRY:   Past Surgical History:  Procedure Laterality Date  . A/V FISTULAGRAM Left 07/09/2016   Procedure: A/V Fistulagram;  Surgeon: [name], MD;  Location: [facility] INVASIVE CV LAB;  Service: Cardiovascular;  Laterality: Left;  . BUNIONECTOMY Left 2003  . CARDIAC CATHETERIZATION N/A 01/18/2015   patent coronary arteries without significant osbtruction and preserved LV function, severe aortic stenosis; Procedure: Right/Left Heart Cath and Coronary Angiography;  Surgeon: Tonny Bollman, MD  . CHOLECYSTECTOMY  2012  . COLONOSCOPY  08/2011   colon biopsies, [name]  . ESOPHAGOGASTRODUODENOSCOPY  08/2011   gastric cardia polyp  . ESOPHAGOGASTRODUODENOSCOPY Left 11/11/2014   Procedure: ESOPHAGOGASTRODUODENOSCOPY (EGD);  Surgeon: [name], MD;  Location: [facility] ENDOSCOPY;  Service: Endoscopy;  Laterality:  Left;  . EXTERIORIZATION OF A CONTINUOUS AMBULATORY PERITONEAL DIALYSIS CATHETER  01/2013   removal 12/2103 - [name]  . hospitalization  12/2013   recurrent R pleural effusion due to peritoneal fluid translocation s/p rpt thoracentesis with 1.3 L fluid removed, ERSD started on HD this hospitalization  . PERIPHERAL VASCULAR CATHETERIZATION N/A 11/12/2014   Procedure: IVC Filter Insertion;  Surgeon: [name], MD;  Location: [facility] INVASIVE CV LAB;  Service: Cardiovascular;   Laterality: N/A;  . PERIPHERAL VASCULAR CATHETERIZATION Left 10/24/2015   Procedure: Lower Extremity Venography with intervention (thormbolysis/thrombectomy);  Surgeon: [name], MD;  Location: [facility] INVASIVE CV LAB;  Service: Cardiovascular;  Laterality: Left;  . REPLACEMENT TOTAL KNEE Left 2006  . SHOULDER ARTHROSCOPY Right 2009  . TEE WITHOUT CARDIOVERSION N/A 02/19/2015   Procedure: TRANSESOPHAGEAL ECHOCARDIOGRAM (TEE);  Surgeon: Tonny Bollman, MD;  Location: [facility] OR;  Service: Open Heart Surgery;  Laterality: N/A;  . TONSILLECTOMY  1955  . TRANSCATHETER AORTIC VALVE REPLACEMENT, TRANSFEMORAL Right 02/19/2015   Procedure: TRANSCATHETER AORTIC VALVE REPLACEMENT, TRANSFEMORAL;  Surgeon: Tonny Bollman, MD;  Location: [facility] OR;  Service: Open Heart Surgery;  Laterality: Right;  . TUBAL LIGATION  1980  . US ECHOCARDIOGRAPHY  12/2013   EF 55-60%, nl LV sys fxn, mild-mod MR, AS, increased LV posterior wall thickness, mild TR    SOCIAL HISTORY:   Social History   Tobacco Use  . Smoking status: Former Smoker    Packs/day: 2.00    Years: 30.00    Pack years: 60.00    Types: Cigarettes    Start date: 04/20/1978    Last attempt to quit: 04/21/2007    Years since quitting: 10.2  . Smokeless tobacco: Never Used  Substance Use Topics  . Alcohol use: No    FAMILY HISTORY:   Family History  Problem Relation Age of Onset  . Deep vein thrombosis Brother   . Cancer Mother 61       colon  . Stroke Mother   . Cancer Father        prostate  . Hypertension Father   . Dementia Father 71  . Hypertension Brother   . Breast cancer Daughter 49  . Diabetes Neg Hx   . CAD Neg Hx     DRUG ALLERGIES:   Allergies  Allergen Reactions  . Nodolor [Isometheptene-Dichloral-Apap] Other (See Comments)    Reaction:  Headaches     REVIEW OF SYSTEMS:  CONSTITUTIONAL: No fever, fatigue or weakness.  EYES: No blurred or double vision.  EARS, NOSE, AND THROAT: No tinnitus or ear pain.  RESPIRATORY: Reports  cough, shortness of breath, wheezing or hemoptysis.  CARDIOVASCULAR: No chest pain, orthopnea, edema.  GASTROINTESTINAL: No nausea, vomiting, diarrhea or abdominal pain.  GENITOURINARY: No dysuria, hematuria.  ENDOCRINE: No polyuria, nocturia,  HEMATOLOGY: No anemia, easy bruising or bleeding SKIN: No rash or lesion. MUSCULOSKELETAL: No joint pain or arthritis.   NEUROLOGIC: No tingling, numbness, weakness.  PSYCHIATRY: No anxiety or depression.   MEDICATIONS AT HOME:   Prior to Admission medications   Medication Sig Start Date End Date Taking? Authorizing Provider  acetaminophen (TYLENOL) 500 MG tablet Take 1,000 mg by mouth every 6 (six) hours as needed for mild pain or headache.    Yes [provider]  albuterol (VENTOLIN HFA) 108 (90 BASE) MCG/ACT inhaler Inhale 2 puffs into the lungs every 4 (four) hours as needed for wheezing or shortness of breath. 04/08/15  Yes [name], MD  ALPRAZolam (NIRAVAM) 0.5  MG dissolvable tablet TAKE ONE-HALF TO ONE TABLET BY MOUTH ONCE DAILY AS NEEDED FOR ANXIETY 07/07/17  Yes [name], MD  atorvastatin (LIPITOR) 40 MG tablet TAKE 1 TABLET BY MOUTH ONCE DAILY AT  6PM. NEEDS OFFICE VISIT. 06/25/17  Yes [name], MD  calcium acetate (PHOSLO) 667 MG capsule Take 1,334 mg by mouth 3 (three) times daily with meals. Take 2 with meals and snack   Yes [provider]  clopidogrel (PLAVIX) 75 MG tablet TAKE ONE TABLET BY MOUTH ONCE DAILY WITH BREAKFAST 08/06/16  Yes Gollan, Tollie Pizza, MD  diphenhydrAMINE (BENADRYL) 25 MG tablet Take 25 mg by mouth every 6 (six) hours as needed.   Yes [provider]  ELIQUIS 2.5 MG TABS tablet TAKE 1 TABLET BY MOUTH TWICE DAILY 05/28/17  Yes [name], MD  famotidine (PEPCID) 40 MG tablet TAKE 1 TABLET BY MOUTH IN THE EVENING 05/04/17  Yes Merwyn Katos, MD  lidocaine-prilocaine (EMLA) cream Apply 1 application topically as needed (topical anesthesia for hemodialysis if  Gebauers and Lidocaine injection are ineffective.). 11/22/14  Yes Auburn Bilberry, MD  Multiple Vitamin (MULTIVITAMIN WITH MINERALS) TABS tablet Take 1 tablet by mouth daily.   Yes [provider]  pantoprazole (PROTONIX) 40 MG tablet TAKE 1 TABLET BY MOUTH ONCE DAILY AS NEEDED Patient taking differently: 40 mg daily 03/22/17  Yes [name], MD  sertraline (ZOLOFT) 50 MG tablet Take 1 tablet (50 mg total) by mouth daily. 06/15/17  Yes [name], MD  traMADol (ULTRAM) 50 MG tablet Take 0.5-1 tablets (25-50 mg total) by mouth 2 (two) times daily as needed. 06/15/17  Yes [name], MD  traZODone (DESYREL) 50 MG tablet TAKE 1 & 1/2 (ONE & ONE-HALF) TABLETS BY MOUTH AT BEDTIME Patient taking differently: 2 tabs every night 05/17/17  Yes [name], MD  doxycycline (VIBRA-TABS) 100 MG tablet Take 1 tablet (100 mg total) by mouth 2 (two) times daily. Patient not taking: Reported on 07/12/2017 06/23/17   [name], MD      VITAL SIGNS:  Blood pressure (!) 148/133, pulse (!) 108, temperature 98.6 F (37 C), temperature source Oral, resp. rate 19, height 5\' 4"  (1.626 m), weight 103.3 kg (227 lb 12.8 oz), SpO2 98 %.  PHYSICAL EXAMINATION:  GENERAL:  72 y.o.-year-old patient lying in the bed with no acute distress.  EYES: Pupils equal, round, reactive to light and accommodation. No scleral icterus. Extraocular muscles intact.  HEENT: Head atraumatic, normocephalic. Oropharynx and nasopharynx clear.  NECK:  Supple, no jugular venous distention. No thyroid enlargement, no tenderness.  LUNGS: Diminished coarse breath sounds bilaterally, min wheezing, rales,rhonchi or crepitation. No use of accessory muscles of respiration.  CARDIOVASCULAR: S1, S2 normal. No murmurs, rubs, or gallops.  ABDOMEN: Soft, nontender, nondistended. Bowel sounds present. No organomegaly or mass.  EXTREMITIES: No pedal edema, cyanosis, or clubbing.  NEUROLOGIC: Cranial nerves II through XII  are intact. Muscle strength 5/5 in all extremities. Sensation intact. Gait not checked.  PSYCHIATRIC: The patient is alert and oriented x 3.  SKIN: No obvious rash, lesion, or ulcer.   LABORATORY PANEL:   CBC Recent Labs  Lab 07/12/17 0636  WBC 6.8  HGB 11.1*  HCT 34.4*  PLT 137*   ------------------------------------------------------------------------------------------------------------------  Chemistries  Recent Labs  Lab 07/12/17 0636  NA 138  K 4.6  CL 101  CO2 20*  GLUCOSE 99  BUN 44*  CREATININE 9.21*  CALCIUM 7.9*   ------------------------------------------------------------------------------------------------------------------  Cardiac Enzymes Recent Labs  Lab  07/12/17 0636  TROPONINI 0.03*   ------------------------------------------------------------------------------------------------------------------  RADIOLOGY:  Dg Chest Portable 1 View  Result Date: 07/12/2017 CLINICAL DATA:  Cough EXAM: PORTABLE CHEST 1 VIEW COMPARISON:  03/04/2017 FINDINGS: Cardiac shadow is stable. Changes of prior TAVR are noted. The lungs are clear bilaterally. No acute bony abnormality is seen. IMPRESSION: No active disease. Electronically Signed   By: Alcide Clever M.D.   On: 07/12/2017 08:02    EKG:   Orders placed or performed during the hospital encounter of 07/12/17  . ED EKG  . ED EKG    IMPRESSION AND PLAN:   Ariel Smith  is a 72 y.o. female with a known history of chronic diastolic CHF, end-stage renal disease, 80-pack-year history of smoking in the past, hyperlipidemia and other medical problems is presenting to the ED with a chief complaint of worsening of shortness of breath.  Patient reports that shortness of breath has been started on last Friday and slowly has been getting worse.  Today morning she could not breathe and came into the emergency departmen   #Acute hypoxic respiratory failure from COPD exacerbation and CHF exacerbation Admit to  telemetry Consulted nephrology for hemodialysis Steroids, bronchodilator treatments and oxygen Consult pulmonology  #Acute COPD exacerbation Patient  has history of 80 pack years of smoking Provide steroids, bronchodilator treatments and oxygen, antitussives and supportive treatment Follow-up with pulmonology Azithromycin  #Acute CHF exacerbation Discussed with nephrology and patient is scheduled for dialysis today Continue home medication Plavix, Lipitor  #End-stage renal disease on hemodialysis Monday Wednesday and Friday follow-up with nephrology for dialysis today   #hyperlipidemia continue Lipitor  #History of DVT on Eliquis continue the same  GI prophylaxis with Pepcid DVT prophylaxis with Lovenox subcu   All the records are reviewed and case discussed with ED provider. Management plans discussed with the patient, family and they are in agreement.  CODE STATUS: fc   TOTAL TIME TAKING CARE OF THIS PATIENT: 45  minutes.   Note: This dictation was prepared with Dragon dictation along with smaller phrase technology. Any transcriptional errors that result from this process are unintentional.  Ramonita Lab M.D on 07/12/2017 at 3:26 PM  Between 7am to 6pm - Pager - [phone]  After 6pm go to [email or web address] - password [email or web address] [facility]  [facility]  Office  [phone]  CC: Primary care physician; [name], MD

## 2017-07-12 NOTE — ED Notes (Signed)
Pt assisted to the bathroom in room. Pt ambulated independently with a steady gait.

## 2017-07-12 NOTE — Telephone Encounter (Signed)
Per daughter patient is currently in the ER at [facility] for sob with 3 L o2 coughing and has had chest xrays and breathing tx   Patient daughter is agitated and just wants to talk to [name] about what could be going on   When asked if she had talked to the md in the emergency room (attempted to find out if she had requested a card consult to involve [name]) Patient daughter stated "look I have followed all the protocols I just need to speak with [name] this is important"  Before a contact number could be verified she disconnected to call.

## 2017-07-12 NOTE — ED Notes (Signed)
RN unable to take report at this time 

## 2017-07-12 NOTE — Progress Notes (Signed)
Family Meeting Note  Advance Directive:yes  Today a meeting took place with the Patient,daughter Darl Pikes at bedside    The following clinical team members were present during this meeting:MD  The following were discussed:Patient's diagnosis: Acute hypoxic respiratory failure, acute COPD exacerbation, acute CHF exacerbation, end-stage renal disease on hemodialysis, essential hypertension other medical problems, plan of care are discussed in detail with the patient and daughter at bedside.  They verbalized understanding of the plan., Patient's progosis: Unable to determine and Goals for treatment: Full Code, daughter is the healthcare power of attorney  Additional follow-up to be provided: Hospitalist and pulmonology, nephrology  Time spent during discussion:18 min  Ramonita Lab, MD

## 2017-07-13 DIAGNOSIS — R0603 Acute respiratory distress: Secondary | ICD-10-CM

## 2017-07-13 DIAGNOSIS — R05 Cough: Secondary | ICD-10-CM

## 2017-07-13 LAB — PARATHYROID HORMONE, INTACT (NO CA): PTH: 420 pg/mL — AB (ref 15–65)

## 2017-07-13 LAB — COMPREHENSIVE METABOLIC PANEL
ALT: 15 U/L (ref 14–54)
ANION GAP: 14 (ref 5–15)
AST: 32 U/L (ref 15–41)
Albumin: 3.6 g/dL (ref 3.5–5.0)
Alkaline Phosphatase: 99 U/L (ref 38–126)
BUN: 38 mg/dL — ABNORMAL HIGH (ref 6–20)
CHLORIDE: 97 mmol/L — AB (ref 101–111)
CO2: 28 mmol/L (ref 22–32)
Calcium: 8.6 mg/dL — ABNORMAL LOW (ref 8.9–10.3)
Creatinine, Ser: 6.24 mg/dL — ABNORMAL HIGH (ref 0.44–1.00)
GFR, EST AFRICAN AMERICAN: 7 mL/min — AB (ref >60)
GFR, EST NON AFRICAN AMERICAN: 6 mL/min — AB (ref >60)
Glucose, Bld: 192 mg/dL — ABNORMAL HIGH (ref 65–99)
Potassium: 3.8 mmol/L (ref 3.5–5.1)
SODIUM: 139 mmol/L (ref 135–145)
Total Bilirubin: 0.7 mg/dL (ref 0.3–1.2)
Total Protein: 7.1 g/dL (ref 6.5–8.1)

## 2017-07-13 LAB — CBC
HCT: 33.2 % — ABNORMAL LOW (ref 35.0–47.0)
Hemoglobin: 11 g/dL — ABNORMAL LOW (ref 12.0–16.0)
MCH: 35.1 pg — AB (ref 26.0–34.0)
MCHC: 33.3 g/dL (ref 32.0–36.0)
MCV: 105.6 fL — AB (ref 80.0–100.0)
PLATELETS: 112 10*3/uL — AB (ref 150–440)
RBC: 3.15 MIL/uL — AB (ref 3.80–5.20)
RDW: 14.2 % (ref 11.5–14.5)
WBC: 5.7 10*3/uL (ref 3.6–11.0)

## 2017-07-13 MED ORDER — BENZONATATE 100 MG PO CAPS
200.0000 mg | ORAL_CAPSULE | Freq: Three times a day (TID) | ORAL | Status: DC
  Filled 2017-07-13 (×2): qty 2

## 2017-07-13 MED ORDER — SODIUM CHLORIDE 0.9% FLUSH
3.0000 mL | Freq: Two times a day (BID) | INTRAVENOUS | Status: DC
  Administered 2017-07-13: 3 mL via INTRAVENOUS

## 2017-07-13 NOTE — Care Management (Signed)
Patient is from home.  Currently not receiving any services in the home.  Chronic HD at [facility] Chronic home oxygen

## 2017-07-13 NOTE — Progress Notes (Signed)
[facility] - [place] at [facility]   PATIENT NAME: Ariel Smith    MR#:  [id number]  DATE OF BIRTH:  1946/01/01  SUBJECTIVE:  CHIEF COMPLAINT: Patient shortness of breath significantly improved but patient is very tachycardic and dyspneic while ambulating.  Daughter at bedside.  Patient wants to change her CODE STATUS from full code to DNR  REVIEW OF SYSTEMS:  CONSTITUTIONAL: No fever, fatigue or weakness.  EYES: No blurred or double vision.  EARS, NOSE, AND THROAT: No tinnitus or ear pain.  RESPIRATORY: Improving cough, shortness of breath, wheezing or hemoptysis.  CARDIOVASCULAR: No chest pain, orthopnea, edema.  GASTROINTESTINAL: No nausea, vomiting, diarrhea or abdominal pain.  GENITOURINARY: No dysuria, hematuria.  ENDOCRINE: No polyuria, nocturia,  HEMATOLOGY: No anemia, easy bruising or bleeding SKIN: No rash or lesion. MUSCULOSKELETAL: No joint pain or arthritis.   NEUROLOGIC: No tingling, numbness, weakness.  PSYCHIATRY: No anxiety or depression.   DRUG ALLERGIES:   Allergies  Allergen Reactions  . Nodolor [Isometheptene-Dichloral-Apap] Other (See Comments)    Reaction:  Headaches     VITALS:  Blood pressure (!) 151/68, pulse (!) 103, temperature 98.3 F (36.8 C), temperature source Oral, resp. rate 16, height 5\' 4"  (1.626 m), weight 102.5 kg (225 lb 14.4 oz), SpO2 96 %.  PHYSICAL EXAMINATION:  GENERAL:  72 y.o.-year-old patient lying in the bed with no acute distress.  EYES: Pupils equal, round, reactive to light and accommodation. No scleral icterus. Extraocular muscles intact.  HEENT: Head atraumatic, normocephalic. Oropharynx and nasopharynx clear.  NECK:  Supple, no jugular venous distention. No thyroid enlargement, no tenderness.  LUNGS: mod breath sounds bilaterally, no wheezing, rales,rhonchi or crepitation. No use of accessory muscles of respiration.  CARDIOVASCULAR: S1, S2 normal. No murmurs, rubs, or gallops.  ABDOMEN: Soft,  nontender, nondistended. Bowel sounds present. No organomegaly or mass.  EXTREMITIES: No pedal edema, cyanosis, or clubbing.  NEUROLOGIC: Cranial nerves II through XII are intact. Muscle strength 5/5 in all extremities. Sensation intact. Gait not checked.  PSYCHIATRIC: The patient is alert and oriented x 3.  SKIN: No obvious rash, lesion, or ulcer.    LABORATORY PANEL:   CBC Recent Labs  Lab 07/13/17 0439  WBC 5.7  HGB 11.0*  HCT 33.2*  PLT 112*   ------------------------------------------------------------------------------------------------------------------  Chemistries  Recent Labs  Lab 07/13/17 0439  NA 139  K 3.8  CL 97*  CO2 28  GLUCOSE 192*  BUN 38*  CREATININE 6.24*  CALCIUM 8.6*  AST 32  ALT 15  ALKPHOS 99  BILITOT 0.7   ------------------------------------------------------------------------------------------------------------------  Cardiac Enzymes Recent Labs  Lab 07/12/17 0636  TROPONINI 0.03*   ------------------------------------------------------------------------------------------------------------------  RADIOLOGY:  Dg Chest Portable 1 View  Result Date: 07/12/2017 CLINICAL DATA:  Cough EXAM: PORTABLE CHEST 1 VIEW COMPARISON:  03/04/2017 FINDINGS: Cardiac shadow is stable. Changes of prior TAVR are noted. The lungs are clear bilaterally. No acute bony abnormality is seen. IMPRESSION: No active disease. Electronically Signed   By: Alcide Clever M.D.   On: 07/12/2017 08:02    EKG:   Orders placed or performed during the hospital encounter of 07/12/17  . ED EKG  . ED EKG    ASSESSMENT AND PLAN:   Ariel Smith  is a 72 y.o. female with a known history of chronic diastolic CHF, end-stage renal disease, 80-pack-year history of smoking in the past, hyperlipidemia and other medical problems is presenting to the ED with a chief complaint of worsening of shortness of breath.  Patient  reports that shortness of breath has been started on last  Friday and slowly has been getting worse.  Today morning she could not breathe and came into the emergency departmen   #Acute hypoxic respiratory failure from COPD exacerbation and CHF exacerbation Clinically improving  Patient had hemodialysis yesterday Steroids, bronchodilator treatments and oxygen Consult placed to pulmonology  #Acute COPD exacerbation Patient  has history of 80 pack years of smoking Provide steroids, bronchodilator treatments and oxygen, antitussives and supportive treatment Clinically improving Follow-up with op pulmonology Azithromycin  #Acute CHF exacerbation Discussed with nephrology and patient is scheduled for dialysis today Continue home medication Plavix, Lipitor  #End-stage renal disease on hemodialysis Monday Wednesday and Friday follow-up with nephrology for dialysis today   #hyperlipidemia continue Lipitor  #History of DVT on Eliquis continue the same  GI prophylaxis with Pepcid DVT prophylaxis with Lovenox subcu       All the records are reviewed and case discussed with Care Management/Social Workerr. Management plans discussed with the patient, family and they are in agreement.  CODE STATUS: dnr  TOTAL TIME TAKING CARE OF THIS PATIENT:  36  minutes.   POSSIBLE D/C IN 1  DAYS, DEPENDING ON CLINICAL CONDITION.  Note: This dictation was prepared with Dragon dictation along with smaller phrase technology. Any transcriptional errors that result from this process are unintentional.   Ramonita Lab M.D on 07/13/2017 at 1:02 PM  Between 7am to 6pm - Pager - [phone] After 6pm go to [email or web address] - password [email or web address] [facility]  [place] [facility]  Office  [phone]  CC: Primary care physician; [name], MD

## 2017-07-13 NOTE — Telephone Encounter (Signed)
Returned call to patient daughter and let her know Dr. Sung Amabile is aware of request for nebulizer. Unfortunately, we are unable to order anything while patient is in-patient. Patient most likely will need a hospital follow-up appt at which time nebulizer can be order unless, Dr. Sung Amabile ok's to order after discharge.   This is a duplicate message original forwarded to provider.

## 2017-07-13 NOTE — Progress Notes (Signed)
[facility]  ROUNDING NOTE   Subjective:  Patient feeling better today. Less short of breath.  Due for HD again tomorrow.   Objective:  Vital signs in last 24 hours:  Temp:  [98.2 F (36.8 C)-99.4 F (37.4 C)] 98.3 F (36.8 C) (03/26 0755) Pulse Rate:  [98-117] 103 (03/26 0755) Resp:  [16-25] 16 (03/26 0755) BP: (94-152)/(61-101) 151/68 (03/26 0755) SpO2:  [91 %-98 %] 96 % (03/26 0755) Weight:  [102.5 kg (225 lb 14.4 oz)-103 kg (227 lb 1.2 oz)] 102.5 kg (225 lb 14.4 oz) (03/26 0451)  Weight change:  Filed Weights   07/12/17 0948 07/12/17 1730 07/13/17 0451  Weight: 103.3 kg (227 lb 12.8 oz) 103 kg (227 lb 1.2 oz) 102.5 kg (225 lb 14.4 oz)    Intake/Output: I/O last 3 completed shifts: In: -  Out: 3000 [Other:3000]   Intake/Output this shift:  Total I/O In: 480 [P.O.:480] Out: -   Physical Exam: General: No acute distress  Head: Normocephalic, atraumatic. Moist oral mucosal membranes  Eyes: Anicteric  Neck: Supple, trachea midline  Lungs:  Scattered rhonchi and wheezes, normal effort  Heart: S1S2 no rubs  Abdomen:  Soft, nontender, bowel sounds present  Extremities: 1+ peripheral edema.  Neurologic: Awake, alert, following commands  Skin: No lesions  Access: LUE AVF    Basic Metabolic Panel: Recent Labs  Lab 07/12/17 0636 07/12/17 1456 07/13/17 0439  NA 138  --  139  K 4.6  --  3.8  CL 101  --  97*  CO2 20*  --  28  GLUCOSE 99  --  192*  BUN 44*  --  38*  CREATININE 9.21*  --  6.24*  CALCIUM 7.9*  --  8.6*  PHOS  --  3.9  --     Liver Function Tests: Recent Labs  Lab 07/13/17 0439  AST 32  ALT 15  ALKPHOS 99  BILITOT 0.7  PROT 7.1  ALBUMIN 3.6   No results for input(s): LIPASE, AMYLASE in the last 168 hours. No results for input(s): AMMONIA in the last 168 hours.  CBC: Recent Labs  Lab 07/12/17 0636 07/13/17 0439  WBC 6.8 5.7  NEUTROABS 5.9  --   HGB 11.1* 11.0*  HCT 34.4* 33.2*  MCV 106.8* 105.6*  PLT 137* 112*     Cardiac Enzymes: Recent Labs  Lab 07/12/17 0636  TROPONINI 0.03*    BNP: Invalid input(s): POCBNP  CBG: No results for input(s): GLUCAP in the last 168 hours.  Microbiology: Results for orders placed or performed during the hospital encounter of 07/12/17  MRSA PCR Screening     Status: None   Collection Time: 07/12/17 10:09 AM  Result Value Ref Range Status   MRSA by PCR NEGATIVE NEGATIVE Final    Comment:        The GeneXpert MRSA Assay (FDA approved for NASAL specimens only), is one component of a comprehensive MRSA colonization surveillance program. It is not intended to diagnose MRSA infection nor to guide or monitor treatment for MRSA infections. Performed at [facility], [address] [place], [place] [address]     Coagulation Studies: No results for input(s): LABPROT, INR in the last 72 hours.  Urinalysis: No results for input(s): COLORURINE, LABSPEC, PHURINE, GLUCOSEU, HGBUR, BILIRUBINUR, KETONESUR, PROTEINUR, UROBILINOGEN, NITRITE, LEUKOCYTESUR in the last 72 hours.  Invalid input(s): APPERANCEUR    Imaging: Dg Chest Portable 1 View  Result Date: 07/12/2017 CLINICAL DATA:  Cough EXAM: PORTABLE CHEST 1 VIEW COMPARISON:  03/04/2017 FINDINGS: Cardiac shadow is stable. Changes of prior TAVR are noted. The lungs are clear bilaterally. No acute bony abnormality is seen. IMPRESSION: No active disease. Electronically Signed   By: Alcide Clever M.D.   On: 07/12/2017 08:02     Medications:    . apixaban  2.5 mg Oral BID  . atorvastatin  40 mg Oral q1800  . azithromycin  250 mg Oral Daily  . benzonatate  200 mg Oral TID  . calcium acetate  1,334 mg Oral TID WC  . clopidogrel  75 mg Oral Daily  . docusate sodium  100 mg Oral BID  . famotidine  20 mg Oral QPM  . ipratropium-albuterol  3 mL Nebulization Q6H  . mouth rinse  15 mL Mouth Rinse BID  . methylPREDNISolone (SOLU-MEDROL) injection  60 mg Intravenous Q6H  . multivitamin with minerals   1 tablet Oral Daily  . pantoprazole  40 mg Oral Daily  . sertraline  50 mg Oral Daily  . traZODone  50 mg Oral QHS   acetaminophen, albuterol, ALPRAZolam, calcium acetate, diphenhydrAMINE, guaiFENesin-codeine, lidocaine-prilocaine, ondansetron **OR** ondansetron (ZOFRAN) IV, traMADol  Assessment/ Plan:  72 y.o. female with chronic diastolic congestive heart failure, COPD requiring oxygen supplementation by nasal cannula, end-stage renal disease, GERD, history of GI bleed, history of DVT in the leg, hypertension, history of transcatheter aortic valve replacement for severe aortic stenosis  [facility]/TTS/Dr. Kolluru/ LUE AVF  1.  End-stage renal disease:  Patient had HD yesterday, next HD for tomorrow.   2.  Anemia of chronic kidney disease Hgb stable at 11, hold off on epogen for now.   3.  Secondary hyperparathyroidism Phos acceptable at 3.9, maintain the patient on calcium acetate.   4.  Shortness of breath/chronic persistent cough.  Improved clinically with conservative therapy.  Continue oxygen,nebulizer treatments, and steroids.     LOS: 1 Annaliza Zia 3/26/20193:28 PM

## 2017-07-13 NOTE — Progress Notes (Signed)
CH follow-up with patient after lunch. Patient was lying in bed slightly reclined with television turned on. [facility] and patient talked about patient's family, grandchildren, and great grandchildren. Patient stated she hadn't been to [place] in a long time and might want to visit again if not have her family come visit her.

## 2017-07-13 NOTE — Care Management Obs Status (Signed)
MEDICARE OBSERVATION STATUS NOTIFICATION   Patient Details  Name: Ariel Smith MRN: [id number] Date of Birth: August 10, 1945   Medicare Observation Status Notification Given:  Other (see comment)  Received heads up about a potential code 44 approx 4:45. CM came back to the unit and see that there is no discharge order and remains inpatient.  Eber Hong, RN 07/13/2017, 5:46 PM

## 2017-07-13 NOTE — Consult Note (Signed)
[facility] Consultation      Assessment and Plan:  Acute intractable cough with acute bronchitis. -Continue steroids, nebulizers. -Continue empiric treatment for cough including steroids and codeine cough syrup. - We will add scheduled Tessalon. -Discussed with patient some degree of cough and dyspnea is to be expected after her episode of acute bronchitis, I will likely be present for a few weeks after her discharge. --Patient was offered outpatient follow-up, and can follow-up with Ariel Smith after discharge.  History of DVT/PE-recurrent. -Complicated by GI bleeding, no longer on anticoagulation. -Status post IVC filter in July 2016. - Status post lower extremity thrombectomy in July 2017.  Morbid obesity, congestive heart failure, end-stage renal disease on hemodialysis. -Above conditions are likely contributory to the patient's chronic dyspnea.   Date: 07/13/2017  MRN# [id number] Ariel Smith 13-Jul-1945  Referring Physician:   [name] is a 72 y.o. old female seen in consultation for chief complaint of:    Chief Complaint  Patient presents with  . Cough    HPI:  The patient is a 72 year old female history of  end-stage renal disease on hemodialysis cardiomegaly with CHF, oxygen, s/p TAVR bioprosthetic aortic valve, oxygen  dependent COPD.  She last was seen by Dr. Darrol Angel in the pulmonary office July 2018, at that time she was continued on Symbicort and cough medicine for a persistent cough. She also has a history of DVT and PE in June 2016, developed GI bleeding in July 2016, warfarin was held and the patient had an IVC filter placed at that time.  July 2017 he developed recurrent lower extremity DVT and underwent thrombectomy. Patient presents to the hospital at this time with persistent cough and dyspnea, which has been progressive over the last several days.. Review of laboratory studies shows no significant abnormalities noted on her chemistry panel,  there is no leukocytosis on CBC, MRSA PCR negative. Currently the patient is being maintained on 3 L of oxygen via nasal cannula.  She is on azithromycin, guaifenesin codeine, duo nebs, Solu-Medrol 60 mg every 6 hours  Imaging personally reviewed, chest x-ray 07/12/17, changes of chronic bronchitis, mild bibasilar atelectasis with reduced lung volumes due to obesity.  Lungs are otherwise unremarkable, no significant change from previous imaging.   PMHX:   Past Medical History:  Diagnosis Date  . Anemia of chronic disease   . Bronchiectasis (HCC) 08/2014    suggested by thoracic xray  . Chronic diastolic CHF (congestive heart failure) (HCC)    a. echo 10/2014: EF 60-65%, no RWMA, GR1DD; b. 03/2015 Echo: EF 60-65%, no rwma, Gr2 DD.  Marland Kitchen Chronic respiratory failure (HCC)    a. 2/2 COPD; b. on 4-5L via nasal cannula  . COPD (chronic obstructive pulmonary disease) (HCC)   . Depression   . ESRD (end stage renal disease) (HCC) 08/2011   a. on HD (TThSa), L forearm AV fistula, [name]  . Frequent headaches   . GERD (gastroesophageal reflux disease)   . GIB (gastrointestinal bleeding)    a. leading to cessation of warfarin 11/2014  . History of cardiac cath    a. 12/2014 Cath: nl cors. 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  Marland Kitchen History of colon polyps 2013   colonoscopy ([name])  . History of DVT of lower extremity 2009, 2017   a. left sided x2, with PE s/p IVC filter placement, coumadin stopped 2/2 GI bleed in 2016; b. 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  Marland Kitchen HLD (hyperlipidemia)   . HTN (  hypertension)   . Morbid obesity (HCC)   . Osteoarthritis   . Osteopenia 01/2013  . Pulmonary embolism (HCC) 2009  . Secondary hyperparathyroidism of renal origin (HCC)   . Severe aortic stenosis    a. echo 10/2014: mod to sev AS; b. 02/2015 TAVR (Owen/Cooper): Edwards Sapien 3 THV (size 23mm, model #9600TFX, ser# [id number]); c. 03/2015 Echo: EF 60-65%, Gr2 DD, m ild to mod AS (peak grad , mean  ).   Surgical Hx:  Past Surgical History:  Procedure Laterality Date  . A/V FISTULAGRAM Left 07/09/2016   Procedure: A/V Fistulagram;  Surgeon: [name], MD;  Location: [facility] INVASIVE CV LAB;  Service: Cardiovascular;  Laterality: Left;  . BUNIONECTOMY Left 2003  . CARDIAC CATHETERIZATION N/A 01/18/2015   patent coronary arteries without significant osbtruction and preserved LV function, severe aortic stenosis; Procedure: Right/Left Heart Cath and Coronary Angiography;  Surgeon: Tonny Bollman, MD  . CHOLECYSTECTOMY  2012  . COLONOSCOPY  08/2011   colon biopsies, [name]  . ESOPHAGOGASTRODUODENOSCOPY  08/2011   gastric cardia polyp  . ESOPHAGOGASTRODUODENOSCOPY Left 11/11/2014   Procedure: ESOPHAGOGASTRODUODENOSCOPY (EGD);  Surgeon: [name], MD;  Location: [facility] ENDOSCOPY;  Service: Endoscopy;  Laterality: Left;  . EXTERIORIZATION OF A CONTINUOUS AMBULATORY PERITONEAL DIALYSIS CATHETER  01/2013   removal 12/2103 - [name]  . hospitalization  12/2013   recurrent R pleural effusion due to peritoneal fluid translocation s/p rpt thoracentesis with 1.3 L fluid removed, ERSD started on HD this hospitalization  . PERIPHERAL VASCULAR CATHETERIZATION N/A 11/12/2014   Procedure: IVC Filter Insertion;  Surgeon: [name], MD;  Location: [facility] INVASIVE CV LAB;  Service: Cardiovascular;  Laterality: N/A;  . PERIPHERAL VASCULAR CATHETERIZATION Left 10/24/2015   Procedure: Lower Extremity Venography with intervention (thormbolysis/thrombectomy);  Surgeon: [name], MD;  Location: [facility] INVASIVE CV LAB;  Service: Cardiovascular;  Laterality: Left;  . REPLACEMENT TOTAL KNEE Left 2006  . SHOULDER ARTHROSCOPY Right 2009  . TEE WITHOUT CARDIOVERSION N/A 02/19/2015   Procedure: TRANSESOPHAGEAL ECHOCARDIOGRAM (TEE);  Surgeon: Tonny Bollman, MD;  Location: [facility] OR;  Service: Open Heart Surgery;  Laterality: N/A;  . TONSILLECTOMY  1955  . TRANSCATHETER AORTIC VALVE REPLACEMENT, TRANSFEMORAL Right  02/19/2015   Procedure: TRANSCATHETER AORTIC VALVE REPLACEMENT, TRANSFEMORAL;  Surgeon: Tonny Bollman, MD;  Location: [facility] OR;  Service: Open Heart Surgery;  Laterality: Right;  . TUBAL LIGATION  1980  . Ariel Smith ECHOCARDIOGRAPHY  12/2013   EF 55-60%, nl LV sys fxn, mild-mod MR, AS, increased LV posterior wall thickness, mild TR   Family Hx:  Family History  Problem Relation Age of Onset  . Deep vein thrombosis Brother   . Cancer Mother 65       colon  . Stroke Mother   . Cancer Father        prostate  . Hypertension Father   . Dementia Father 75  . Hypertension Brother   . Breast cancer Daughter 74  . Diabetes Neg Hx   . CAD Neg Hx    Social Hx:   Social History   Tobacco Use  . Smoking status: Former Smoker    Packs/day: 2.00    Years: 30.00    Pack years: 60.00    Types: Cigarettes    Start date: 04/20/1978    Last attempt to quit: 04/21/2007    Years since quitting: 10.2  . Smokeless tobacco: Never Used  Substance Use Topics  . Alcohol use: No  . Drug use: No  Medication:    Current Facility-Administered Medications:  .  acetaminophen (TYLENOL) tablet 1,000 mg, 1,000 mg, Oral, Q6H PRN, Gouru, Aruna, MD .  albuterol (PROVENTIL) (2.5 MG/3ML) 0.083% nebulizer solution 2.5 mg, 2.5 mg, Nebulization, Q4H PRN, Gouru, Aruna, MD .  ALPRAZolam Prudy Feeler) tablet 0.5 mg, 0.5 mg, Oral, BID PRN, Gouru, Aruna, MD, 0.5 mg at 07/12/17 2003 .  apixaban (ELIQUIS) tablet 2.5 mg, 2.5 mg, Oral, BID, Gouru, Aruna, MD, 2.5 mg at 07/13/17 0921 .  atorvastatin (LIPITOR) tablet 40 mg, 40 mg, Oral, q1800, Gouru, Aruna, MD .  [COMPLETED] azithromycin (ZITHROMAX) tablet 500 mg, 500 mg, Oral, Daily, 500 mg at 07/12/17 1853 **FOLLOWED BY** azithromycin (ZITHROMAX) tablet 250 mg, 250 mg, Oral, Daily, Gouru, Aruna, MD, 250 mg at 07/13/17 0921 .  calcium acetate (PHOSLO) capsule 1,334 mg, 1,334 mg, Oral, TID WC, Gouru, Aruna, MD, 1,334 mg at 07/13/17 1202 .  calcium acetate (PHOSLO) capsule 1,334 mg, 1,334 mg,  Oral, BID PRN, Gouru, Aruna, MD .  clopidogrel (PLAVIX) tablet 75 mg, 75 mg, Oral, Daily, Gouru, Aruna, MD, 75 mg at 07/13/17 0921 .  diphenhydrAMINE (BENADRYL) capsule 25 mg, 25 mg, Oral, Q6H PRN, Gouru, Aruna, MD, 25 mg at 07/12/17 2347 .  docusate sodium (COLACE) capsule 100 mg, 100 mg, Oral, BID, Gouru, Aruna, MD, 100 mg at 07/13/17 0921 .  famotidine (PEPCID) tablet 20 mg, 20 mg, Oral, QPM, Gouru, Aruna, MD, 20 mg at 07/12/17 1853 .  guaiFENesin-codeine 100-10 MG/5ML solution 5 mL, 5 mL, Oral, Q6H PRN, Gouru, Aruna, MD, 5 mL at 07/13/17 0934 .  ipratropium-albuterol (DUONEB) 0.5-2.5 (3) MG/3ML nebulizer solution 3 mL, 3 mL, Nebulization, Q6H, Gouru, Aruna, MD, 3 mL at 07/13/17 0800 .  lidocaine-prilocaine (EMLA) cream 1 application, 1 application, Topical, PRN, Gouru, Aruna, MD .  MEDLINE mouth rinse, 15 mL, Mouth Rinse, BID, Gouru, Aruna, MD, 15 mL at 07/13/17 0924 .  methylPREDNISolone sodium succinate (SOLU-MEDROL) 125 mg/2 mL injection 60 mg, 60 mg, Intravenous, Q6H, Gouru, Aruna, MD, 60 mg at 07/13/17 1202 .  multivitamin with minerals tablet 1 tablet, 1 tablet, Oral, Daily, Gouru, Aruna, MD, 1 tablet at 07/13/17 0921 .  ondansetron (ZOFRAN) tablet 4 mg, 4 mg, Oral, Q6H PRN **OR** ondansetron (ZOFRAN) injection 4 mg, 4 mg, Intravenous, Q6H PRN, Gouru, Aruna, MD .  pantoprazole (PROTONIX) EC tablet 40 mg, 40 mg, Oral, Daily, Gouru, Aruna, MD, 40 mg at 07/13/17 1660 .  sertraline (ZOLOFT) tablet 50 mg, 50 mg, Oral, Daily, Gouru, Aruna, MD, 50 mg at 07/13/17 0921 .  traMADol (ULTRAM) tablet 25-50 mg, 25-50 mg, Oral, BID PRN, Gouru, Aruna, MD .  traZODone (DESYREL) tablet 50 mg, 50 mg, Oral, QHS, Gouru, Aruna, MD, 50 mg at 07/12/17 2004   Allergies:  Nodolor [isometheptene-dichloral-apap]  Review of Systems: Gen:  Denies  fever, sweats, chills HEENT: Denies blurred vision, double vision. bleeds, sore throat Cvc:  No dizziness, chest pain. Resp:   Denies cough or sputum production,  shortness of breath Gi: Denies swallowing difficulty, stomach pain. Gu:  Denies bladder incontinence, burning urine Ext:   No Joint pain, stiffness. Skin: No skin rash,  hives  Endoc:  No polyuria, polydipsia. Psych: No depression, insomnia. Other:  All other systems were reviewed with the patient and were negative other that what is mentioned in the HPI.   Physical Examination:   VS: BP (!) 151/68 (BP Location: Right Arm)   Pulse (!) 103   Temp 98.3 F (36.8 C) (Oral)   Resp 16  Ht 5\' 4"  (1.626 m)   Wt 225 lb 14.4 oz (102.5 kg)   SpO2 96%   BMI 38.78 kg/m   General Appearance: No distress  Neuro:without focal findings,  speech normal,  HEENT: PERRLA, EOM intact.   Pulmonary: normal breath sounds, No wheezing.  CardiovascularNormal S1,S2.  No m/r/g.   Abdomen: Benign, Soft, non-tender. Renal:  No costovertebral tenderness  GU:  No performed at this time. Endoc: No evident thyromegaly, no signs of acromegaly. Skin:   warm, no rashes, no ecchymosis  Extremities: normal, no cyanosis, clubbing.  Other findings:    LABORATORY PANEL:   CBC Recent Labs  Lab 07/13/17 0439  WBC 5.7  HGB 11.0*  HCT 33.2*  PLT 112*   ------------------------------------------------------------------------------------------------------------------  Chemistries  Recent Labs  Lab 07/13/17 0439  NA 139  K 3.8  CL 97*  CO2 28  GLUCOSE 192*  BUN 38*  CREATININE 6.24*  CALCIUM 8.6*  AST 32  ALT 15  ALKPHOS 99  BILITOT 0.7   ------------------------------------------------------------------------------------------------------------------  Cardiac Enzymes Recent Labs  Lab 07/12/17 0636  TROPONINI 0.03*   ------------------------------------------------------------  RADIOLOGY:  Dg Chest Portable 1 View  Result Date: 07/12/2017 CLINICAL DATA:  Cough EXAM: PORTABLE CHEST 1 VIEW COMPARISON:  03/04/2017 FINDINGS: Cardiac shadow is stable. Changes of prior TAVR are noted. The  lungs are clear bilaterally. No acute bony abnormality is seen. IMPRESSION: No active disease. Electronically Signed   By: Alcide Clever M.D.   On: 07/12/2017 08:02       Thank  you for the consultation and for allowing [facility] to assist in the care of your patient. Our recommendations are noted above.  Please contact Ariel Smith if we can be of further service.   Wells Guiles, MD.  Board Certified in Internal Medicine, Pulmonary Medicine, Critical Care Medicine, and Sleep Medicine.  [facility] Office Number: [phone]  Santiago Glad, M.D.  Billy Fischer, M.D  07/13/2017

## 2017-07-13 NOTE — Plan of Care (Signed)
  Problem: Clinical Measurements: Goal: Will remain free from infection Outcome: Progressing Note:  Taking po antibiotics   Problem: Safety: Goal: Ability to remain free from injury will improve Outcome: Progressing Note:  Fall precautions in place, non skid socks when oob   Problem: Clinical Measurements: Goal: Diagnostic test results will improve Outcome: Not Progressing Note:  BUN 38/6.24, K 3.8 Goal: Respiratory complications will improve Outcome: Not Progressing Note:  Still continues to have cough, prn medications

## 2017-07-13 NOTE — Progress Notes (Signed)
Family Meeting Note  Advance Directive:yes  Today a meeting took place with the Patient, daughter at bed side    The following clinical team members were present during this meeting:MD  The following were discussed:Patient's diagnosis: Acute hypoxic respiratory failure, acute COPD exacerbation, acute CHF exacerbation, end-stage renal disease on hemodialysis and  the treatment plan of care discussed in detail with the patient and daughter at bedside.  They verbalized understanding of the plan, Patient's progosis: Unable to determine and Goals for treatment: DNR, daughter is the healthcare power of attorney  Additional follow-up to be provided: Hospitalist, nephrology and pulmonology  Time spent during discussion:17 min  Ramonita Lab, MD

## 2017-07-13 NOTE — Progress Notes (Signed)
Chaplain Lavina Hamman was paged to complete the HCPOA and LW for the patient. Chaplain Quaniya Damas responded and met with the patient and her daughter. After reviewing the patient's choices, the Chaplain secured both witnesses and notary to complete the AD. A copy was given to the unit secretary for inclusion in the patient's chart.

## 2017-07-13 NOTE — Progress Notes (Signed)
Ch consult with patient. Patient requested CH visit. Patient has questions about her DNR. CH spoke with staff and patient about the placement of the DNR order once patient is discharged. Patient spoke about how things are different here in [place] compared to [place]. CH provided ministry of presence, emotional support, and prayer.

## 2017-07-14 MED ORDER — IPRATROPIUM-ALBUTEROL 0.5-2.5 (3) MG/3ML IN SOLN: 3.0000 mL | Freq: Four times a day (QID) | RESPIRATORY_TRACT | 1 refills | Status: AC | PRN

## 2017-07-14 MED ORDER — PREDNISONE 10 MG PO TABS: 10.0000 mg | ORAL_TABLET | Freq: Every day | ORAL | 0 refills | Status: DC

## 2017-07-14 MED ORDER — AZITHROMYCIN 250 MG PO TABS: ORAL_TABLET | ORAL | 2 refills | Status: DC

## 2017-07-14 NOTE — Discharge Summary (Signed)
[facility] [place] at [facility]   PATIENT NAME: Ariel Smith    MR#:  [id number]  DATE OF BIRTH:  11-Aug-1945  DATE OF ADMISSION:  07/12/2017   ADMITTING PHYSICIAN: Ramonita Lab, MD  DATE OF DISCHARGE: No discharge date for patient encounter.  PRIMARY CARE PHYSICIAN: [name], MD   ADMISSION DIAGNOSIS:  1.  Acute hypoxic respiratory failure 2 Acute COPD exacerbation. 3.  Acute congestive heart failure exacerbation (diastolic) 4.end-stage renal disease on dialysis .   DISCHARGE DIAGNOSIS:  1.  Hypoxia resolved 2.  Chronic respiratory failure 3.  COPD exacerbation resolved 4.  Congestive heart failure Diastolic 5.  End-stage renal disease on dialysis 6.  History of DVT and PE recurrent Status post IVC filter in July 2016 Status post lower extremity thrombectomy in July 2017 SECONDARY DIAGNOSIS:   Past Medical History:  Diagnosis Date  . Anemia of chronic disease   . Bronchiectasis (HCC) 08/2014    suggested by thoracic xray  . Chronic diastolic CHF (congestive heart failure) (HCC)    a. echo 10/2014: EF 60-65%, no RWMA, GR1DD; b. 03/2015 Echo: EF 60-65%, no rwma, Gr2 DD.  Marland Kitchen Chronic respiratory failure (HCC)    a. 2/2 COPD; b. on 4-5L via nasal cannula  . COPD (chronic obstructive pulmonary disease) (HCC)   . Depression   . ESRD (end stage renal disease) (HCC) 08/2011   a. on HD (TThSa), L forearm AV fistula, [name]  . Frequent headaches   . GERD (gastroesophageal reflux disease)   . GIB (gastrointestinal bleeding)    a. leading to cessation of warfarin 11/2014  . History of cardiac cath    a. 12/2014 Cath: nl cors. 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  Marland Kitchen History of colon polyps 2013   colonoscopy ([name])  . History of DVT of lower extremity 2009, 2017   a. left sided x2, with PE s/p IVC filter placement, coumadin stopped 2/2 GI bleed in 2016; b. 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  Marland Kitchen HLD (hyperlipidemia)   . HTN  (hypertension)   . Morbid obesity (HCC)   . Osteoarthritis   . Osteopenia 01/2013  . Pulmonary embolism (HCC) 2009  . Secondary hyperparathyroidism of renal origin (HCC)   . Severe aortic stenosis    a. echo 10/2014: mod to sev AS; b. 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 03/2015 Echo: EF 60-65%, Gr2 DD, m ild to mod AS (peak grad , mean ).   HOSPITAL COURSE:  72 year old female patient with history of chronic diastolic heart failure, end-stage renal disease on dialysis, 80-year pack history of smoking in the past, hyperlipidemia presented to the emergency room on 07/12/2017 for shortness of breath, cough.  Patient had elevated BNP.  She was admitted to telemetry and was started on IV Solu-Medrol and bronchodilator nebulization treatments.  Oxygen was given via nasal cannula.  Pulmonary consultation was done with Dr. Nicholos Johns.  Patient was dialyzed for CHF exacerbation by nephrology.  She tolerated steroids and nebulization treatments well.  Shortness of breath improved.  She will be discharged home and will continue home oxygen and dialysis scheduled for tomorrow morning.  She will follow-up with pulmonary attending as an outpatient.  Patient will continue anticoagulation with oral Eliquis on the same dose as she was on the home regimen patient hemodynamically stable will be discharged home.  Position plan discussed with patient and family.   DISCHARGE CONDITIONS:  Stable CONSULTS OBTAINED:  Treatment Team:  Mady Haagensen, MD  Dr Nicholos Johns DRUG ALLERGIES:   Allergies  Allergen Reactions  . Nodolor [Isometheptene-Dichloral-Apap] Other (See Comments)    Reaction:  Headaches    DISCHARGE MEDICATIONS:   Allergies as of 07/14/2017      Reactions   Nodolor [isometheptene-dichloral-apap] Other (See Comments)   Reaction:  Headaches       Medication List    STOP taking these medications   acetaminophen 500 MG  tablet Commonly known as:  TYLENOL   doxycycline 100 MG tablet Commonly known as:  VIBRA-TABS   pantoprazole 40 MG tablet Commonly known as:  PROTONIX     TAKE these medications   albuterol 108 (90 Base) MCG/ACT inhaler Commonly known as:  VENTOLIN HFA Inhale 2 puffs into the lungs every 4 (four) hours as needed for wheezing or shortness of breath.   ALPRAZolam 0.5 MG dissolvable tablet Commonly known as:  NIRAVAM TAKE ONE-HALF TO ONE TABLET BY MOUTH ONCE DAILY AS NEEDED FOR ANXIETY   atorvastatin 40 MG tablet Commonly known as:  LIPITOR TAKE 1 TABLET BY MOUTH ONCE DAILY AT  6PM. NEEDS OFFICE VISIT.   azithromycin 250 MG tablet Commonly known as:  ZITHROMAX Take orally once daily   calcium acetate 667 MG capsule Commonly known as:  PHOSLO Take 1,334 mg by mouth 3 (three) times daily with meals. Take 2 with meals and snack   clopidogrel 75 MG tablet Commonly known as:  PLAVIX TAKE ONE TABLET BY MOUTH ONCE DAILY WITH BREAKFAST   diphenhydrAMINE 25 MG tablet Commonly known as:  BENADRYL Take 25 mg by mouth every 6 (six) hours as needed.   ELIQUIS 2.5 MG Tabs tablet Generic drug:  apixaban TAKE 1 TABLET BY MOUTH TWICE DAILY   famotidine 40 MG tablet Commonly known as:  PEPCID TAKE 1 TABLET BY MOUTH IN THE EVENING   ipratropium-albuterol 0.5-2.5 (3) MG/3ML Soln Commonly known as:  DUONEB Take 3 mLs by nebulization every 6 (six) hours as needed (shortness of breath/ wheezing).   lidocaine-prilocaine cream Commonly known as:  EMLA Apply 1 application topically as needed (topical anesthesia for hemodialysis if Gebauers and Lidocaine injection are ineffective.).   multivitamin with minerals Tabs tablet Take 1 tablet by mouth daily.   predniSONE 10 MG tablet Commonly known as:  DELTASONE Take 1 tablet (10 mg total) by mouth daily. Label  & dispense according to the schedule below.  6 tablets day one, then 5 table day 2, then 4 tablets day 3, then 3 tablets day 4, 2  tablets day 5, then 1 tablet day 6, then stop   sertraline 50 MG tablet Commonly known as:  ZOLOFT Take 1 tablet (50 mg total) by mouth daily.   traMADol 50 MG tablet Commonly known as:  ULTRAM Take 0.5-1 tablets (25-50 mg total) by mouth 2 (two) times daily as needed.   traZODone 50 MG tablet Commonly known as:  DESYREL TAKE 1 & 1/2 (ONE & ONE-HALF) TABLETS BY MOUTH AT BEDTIME What changed:  See the new instructions.        DISCHARGE INSTRUCTIONS:   DIET:  Renal diet DISCHARGE CONDITION:  Stable ACTIVITY:  As tolerated OXYGEN:  Home Oxygen: Oxygen via nasal cannula Oxygen Delivery: Via nasal cannula DISCHARGE LOCATION:  Home  If you experience worsening of your admission symptoms, develop shortness of breath, life threatening emergency, suicidal or homicidal thoughts you must seek medical attention immediately by calling 911 or calling your MD immediately  if symptoms less severe.  You Must read complete instructions/literature along with all the possible adverse reactions/side effects for all the Medicines you take and that have been prescribed to you. Take any new Medicines after you have completely understood and accpet all the possible adverse reactions/side effects.   Please note  You were cared for by a hospitalist during your hospital stay. If you have any questions about your discharge medications or the care you received while you were in the hospital after you are discharged, you can call the unit and asked to speak with the hospitalist on call if the hospitalist that took care of you is not available. Once you are discharged, your primary care physician will handle any further medical issues. Please note that NO REFILLS for any discharge medications will be authorized once you are discharged, as it is imperative that you return to your primary care physician (or establish a relationship with a primary care physician if you do not have one) for your aftercare needs  so that they can reassess your need for medications and monitor your lab values.    On the day of Discharge:  Patient seen and evaluated Shortness of breath and wheezing No complaints of any chest pain No fever or chills Had bowel movement VITAL SIGNS:  Blood pressure 137/65, pulse (!) 103, temperature 98.3 F (36.8 C), temperature source Oral, resp. rate (!) 21, height 5\' 4"  (1.626 m), weight 102.5 kg (225 lb 14.4 oz), SpO2 97 %. PHYSICAL EXAMINATION:  GENERAL:  72 y.o.-year-old patient lying in the bed with no acute distress.  EYES: Pupils equal, round, reactive to light and accommodation. No scleral icterus. Extraocular muscles intact.  HEENT: Head atraumatic, normocephalic. Oropharynx and nasopharynx clear.  NECK:  Supple, no jugular venous distention. No thyroid enlargement, no tenderness.  LUNGS: Normal breath sounds bilaterally, no wheezing, rales,rhonchi or crepitation. No use of accessory muscles of respiration.  CARDIOVASCULAR: S1, S2 normal. No murmurs, rubs, or gallops.  ABDOMEN: Soft, non-tender, non-distended. Bowel sounds present. No organomegaly or mass.  EXTREMITIES: No pedal edema, cyanosis, or clubbing.  NEUROLOGIC: Cranial nerves II through XII are intact. Muscle strength 5/5 in all extremities. Sensation intact. Gait not checked.  PSYCHIATRIC: The patient is alert and oriented x 3.  SKIN: No obvious rash, lesion, or ulcer.  DATA REVIEW:   CBC Recent Labs  Lab 07/13/17 0439  WBC 5.7  HGB 11.0*  HCT 33.2*  PLT 112*    Chemistries  Recent Labs  Lab 07/13/17 0439  NA 139  K 3.8  CL 97*  CO2 28  GLUCOSE 192*  BUN 38*  CREATININE 6.24*  CALCIUM 8.6*  AST 32  ALT 15  ALKPHOS 99  BILITOT 0.7     Microbiology Results  Results for orders placed or performed during the hospital encounter of 07/12/17  MRSA PCR Screening     Status: None   Collection Time: 07/12/17 10:09 AM  Result Value Ref Range Status   MRSA by PCR NEGATIVE NEGATIVE Final     Comment:        The GeneXpert MRSA Assay (FDA approved for NASAL specimens only), is one component of a comprehensive MRSA colonization surveillance program. It is not intended to diagnose MRSA infection nor to guide or monitor treatment for MRSA infections. Performed at [facility], [address] [place], [place] [address]     RADIOLOGY:  No results found.   Management plans discussed with the patient, family and they are in agreement.  CODE STATUS: DNR  TOTAL TIME TAKING CARE OF THIS PATIENT: 38 minutes.    Ihor Austin M.D on 07/14/2017 at 5:15 PM  Between 7am to 6pm - Pager - [phone]  After 6pm go to [email or web address] - Social research officer, government  [facility]  Office  [phone]  CC: Primary care physician; [name], MD   Note: This dictation was prepared with Dragon dictation along with smaller phrase technology. Any transcriptional errors that result from this process are unintentional.

## 2017-07-14 NOTE — Progress Notes (Signed)
HD Tx ended  

## 2017-07-14 NOTE — Care Management (Signed)
Patient has declined need for any home health services.  Ariel Smith with [facility] reaching out to nephrology. There is an available chair at patient clinic in the morning at 7:30a and will request whether patient could dialyze then instead of today

## 2017-07-14 NOTE — Progress Notes (Signed)
HD tx ended    07/14/17 1815  Vital Signs  Pulse Rate 89  Resp 18  BP (!) 133/95  BP Location Right Arm  BP Method Automatic  Patient Position (if appropriate) Lying  Oxygen Therapy  SpO2 98 %  O2 Device Nasal Cannula  O2 Flow Rate (L/min) 3 L/min  Pulse Oximetry Type Continuous  Pain Assessment  Pain Scale 0-10  Pain Score 0  During Hemodialysis Assessment  Blood Flow Rate (mL/min) 400 mL/min  Arterial Pressure (mmHg) -200 mmHg  Venous Pressure (mmHg) 170 mmHg  Transmembrane Pressure (mmHg) 70 mmHg  Ultrafiltration Rate (mL/min) 1210 mL/min  Dialysate Flow Rate (mL/min) 800 ml/min  Conductivity: Machine  14  HD Safety Checks Performed Yes  Intra-Hemodialysis Comments Tx completed;Tolerated well  Education / Care Plan  Dialysis Education Provided Yes  Documented Education in Care Plan Yes  Note  Observations Pt signed off AMA anxious to go home when daughter gets off work at 6pm and can pick her up.  (copy placed in pt chart. education provided to pt )  Fistula / Graft Left Forearm Arteriovenous fistula  No Placement Date or Time found.   Placed prior to admission: Yes  Orientation: Left  Access Location: Forearm  Access Type: Arteriovenous fistula  Site Condition No complications  Fistula / Graft Assessment Present;Absent ;Thrill  Status Deaccessed

## 2017-07-14 NOTE — Progress Notes (Signed)
HD Tx ended UF goal met    07/14/17 1815  Vital Signs  Pulse Rate 89  Resp 18  BP (!) 133/95  BP Location Right Arm  BP Method Automatic  Patient Position (if appropriate) Lying  Oxygen Therapy  SpO2 98 %  O2 Device Nasal Cannula  O2 Flow Rate (L/min) 3 L/min  Pulse Oximetry Type Continuous  Pain Assessment  Pain Scale 0-10  Pain Score 0  During Hemodialysis Assessment  Blood Flow Rate (mL/min) 400 mL/min  Arterial Pressure (mmHg) -200 mmHg  Venous Pressure (mmHg) 170 mmHg  Transmembrane Pressure (mmHg) 70 mmHg  Ultrafiltration Rate (mL/min) 1210 mL/min  Dialysate Flow Rate (mL/min) 800 ml/min  Conductivity: Machine  14  HD Safety Checks Performed Yes  Intra-Hemodialysis Comments Tx completed;Tolerated well  Post-Hemodialysis Assessment  Rinseback Volume (mL) 250 mL  Dialyzer Clearance Clear  Duration of HD Treatment -hour(s) 2 hour(s)  Hemodialysis Intake (mL) 500 mL  UF Total -Machine (mL) 2116 mL  Net UF (mL) 1616 mL  Tolerated HD Treatment Yes  AVG/AVF Arterial Site Held (minutes) 15 minutes  AVG/AVF Venous Site Held (minutes) 5 minutes  Education / Care Plan  Dialysis Education Provided Yes  Documented Education in Care Plan Yes  Note  Observations Pt signed off AMA anxious to go home when daughter gets off work at 6pm and can pick her up.  (copy placed in pt chart. education provided to pt )  Fistula / Graft Left Forearm Arteriovenous fistula  No Placement Date or Time found.   Placed prior to admission: Yes  Orientation: Left  Access Location: Forearm  Access Type: Arteriovenous fistula  Site Condition No complications  Fistula / Graft Assessment Present;Absent ;Thrill  Status Deaccessed

## 2017-07-14 NOTE — Progress Notes (Signed)
[facility]     Assessment and Plan:  Acute intractable cough with acute bronchitis-- improved.. -Continue steroids, nebulizers. -Continue empiric treatment for cough including steroids and codeine cough syrup. -Tessalon as meeded/ . -Discussed with patient some degree of cough and dyspnea is to be expected after her episode of acute bronchitis, I will likely be present for a few weeks after her discharge. --Patient was offered outpatient follow-up, and can follow-up with [place] after discharge.  History of DVT/PE-recurrent. -Complicated by GI bleeding, no longer on anticoagulation. -Status post IVC filter in July 2016. - Status post lower extremity thrombectomy in July 2017.  Morbid obesity, congestive heart failure, end-stage renal disease on hemodialysis. -Above conditions are likely contributory to the patient's chronic dyspnea. -Patient has symptoms and signs of obstructive sleep apnea, she declines further workup for sleep apnea.  Okay for discharge from respiratory standpoint, pulmonary service will sign off for now, please call if there are any further questions or concerns.  Date: 07/14/2017  MRN# [id number] ATIYAH BAUER Oct 14, 1945   Ariel Smith is a 72 y.o. old female seen in follow up for chief complaint of  Chief Complaint  Patient presents with  . Cough     HPI:   Currently the patient remains on 3 L of oxygen via nasal cannula, which is her home dose.  I did check her oxygen saturation off of oxygen and she was 89% on room air.  She remains on Tessalon 200 mg 3 times daily, guaifenesin codeine every 6 hours, duo nebs, Solu-Medrol 60 mg every 6 hours, and Protonix.  She notes that her breathing is doing much better today, she is being planned for discharge today after dialysis and feels ready to go home.  She appears to be in good spirits.  Medication:    Current Facility-Administered Medications:  .  acetaminophen (TYLENOL) tablet  1,000 mg, 1,000 mg, Oral, Q6H PRN, Gouru, Aruna, MD .  albuterol (PROVENTIL) (2.5 MG/3ML) 0.083% nebulizer solution 2.5 mg, 2.5 mg, Nebulization, Q4H PRN, Gouru, Aruna, MD .  ALPRAZolam Prudy Feeler) tablet 0.5 mg, 0.5 mg, Oral, BID PRN, Gouru, Aruna, MD, 0.5 mg at 07/12/17 2003 .  apixaban (ELIQUIS) tablet 2.5 mg, 2.5 mg, Oral, BID, Gouru, Aruna, MD, 2.5 mg at 07/13/17 2107 .  atorvastatin (LIPITOR) tablet 40 mg, 40 mg, Oral, q1800, Gouru, Aruna, MD, 40 mg at 07/13/17 1711 .  [COMPLETED] azithromycin (ZITHROMAX) tablet 500 mg, 500 mg, Oral, Daily, 500 mg at 07/12/17 1853 **FOLLOWED BY** azithromycin (ZITHROMAX) tablet 250 mg, 250 mg, Oral, Daily, Gouru, Aruna, MD, 250 mg at 07/13/17 0921 .  benzonatate (TESSALON) capsule 200 mg, 200 mg, Oral, TID, Shane Crutch, MD .  calcium acetate (PHOSLO) capsule 1,334 mg, 1,334 mg, Oral, TID WC, Gouru, Aruna, MD, 1,334 mg at 07/14/17 0845 .  calcium acetate (PHOSLO) capsule 1,334 mg, 1,334 mg, Oral, BID PRN, Gouru, Aruna, MD .  clopidogrel (PLAVIX) tablet 75 mg, 75 mg, Oral, Daily, Gouru, Aruna, MD, 75 mg at 07/13/17 0921 .  diphenhydrAMINE (BENADRYL) capsule 25 mg, 25 mg, Oral, Q6H PRN, Gouru, Aruna, MD, 25 mg at 07/13/17 2107 .  docusate sodium (COLACE) capsule 100 mg, 100 mg, Oral, BID, Gouru, Aruna, MD, 100 mg at 07/13/17 2106 .  famotidine (PEPCID) tablet 20 mg, 20 mg, Oral, QPM, Gouru, Aruna, MD, 20 mg at 07/13/17 1711 .  guaiFENesin-codeine 100-10 MG/5ML solution 5 mL, 5 mL, Oral, Q6H PRN, Gouru, Aruna, MD, 5 mL at 07/13/17 1827 .  ipratropium-albuterol (  DUONEB) 0.5-2.5 (3) MG/3ML nebulizer solution 3 mL, 3 mL, Nebulization, Q6H, Gouru, Aruna, MD, 3 mL at 07/14/17 0857 .  lidocaine-prilocaine (EMLA) cream 1 application, 1 application, Topical, PRN, Gouru, Aruna, MD .  MEDLINE mouth rinse, 15 mL, Mouth Rinse, BID, Gouru, Aruna, MD, 15 mL at 07/13/17 0924 .  methylPREDNISolone sodium succinate (SOLU-MEDROL) 125 mg/2 mL injection 60 mg, 60 mg,  Intravenous, Q6H, Gouru, Aruna, MD, 60 mg at 07/14/17 0646 .  multivitamin with minerals tablet 1 tablet, 1 tablet, Oral, Daily, Gouru, Aruna, MD, 1 tablet at 07/13/17 0921 .  ondansetron (ZOFRAN) tablet 4 mg, 4 mg, Oral, Q6H PRN **OR** ondansetron (ZOFRAN) injection 4 mg, 4 mg, Intravenous, Q6H PRN, Gouru, Aruna, MD .  pantoprazole (PROTONIX) EC tablet 40 mg, 40 mg, Oral, Daily, Gouru, Aruna, MD, 40 mg at 07/13/17 0981 .  sertraline (ZOLOFT) tablet 50 mg, 50 mg, Oral, Daily, Gouru, Aruna, MD, 50 mg at 07/13/17 0921 .  sodium chloride flush (NS) 0.9 % injection 3 mL, 3 mL, Intravenous, Q12H, Gouru, Aruna, MD, 3 mL at 07/13/17 2108 .  traMADol (ULTRAM) tablet 25-50 mg, 25-50 mg, Oral, BID PRN, Gouru, Aruna, MD .  traZODone (DESYREL) tablet 50 mg, 50 mg, Oral, QHS, Gouru, Aruna, MD, 50 mg at 07/13/17 2107   Allergies:  Nodolor [isometheptene-dichloral-apap]  Review of Systems: Gen:  Denies  fever, sweats. HEENT: Denies blurred vision. Cvc:  No dizziness, chest pain or heaviness Resp:   Denies cough or sputum porduction. Gi: Denies swallowing difficulty, stomach pain. constipation, bowel incontinence Gu:  Denies bladder incontinence, burning urine Ext:   No Joint pain, stiffness. Skin: No skin rash, easy bruising. Endoc:  No polyuria, polydipsia. Psych: No depression, insomnia. Other:  All other systems were reviewed and found to be negative other than what is mentioned in the HPI.   Physical Examination:   VS: BP 137/65 (BP Location: Right Arm)   Pulse (!) 103   Temp 98.3 F (36.8 C) (Oral)   Resp (!) 21   Ht 5\' 4"  (1.626 m)   Wt 225 lb 14.4 oz (102.5 kg)   SpO2 97%   BMI 38.78 kg/m   General Appearance: No distress  Neuro:without focal findings,  speech normal,  HEENT: PERRLA, EOM intact. Pulmonary: normal breath sounds, No wheezing.   CardiovascularNormal S1,S2.  No m/r/g.   Abdomen: Benign, Soft, non-tender. Renal:  No costovertebral tenderness  GU:  Not performed at  this time. Endoc: No evident thyromegaly, no signs of acromegaly. Skin:   warm, no rash. Extremities: normal, no cyanosis, clubbing.   LABORATORY PANEL:   CBC Recent Labs  Lab 07/13/17 0439  WBC 5.7  HGB 11.0*  HCT 33.2*  PLT 112*   ------------------------------------------------------------------------------------------------------------------  Chemistries  Recent Labs  Lab 07/13/17 0439  NA 139  K 3.8  CL 97*  CO2 28  GLUCOSE 192*  BUN 38*  CREATININE 6.24*  CALCIUM 8.6*  AST 32  ALT 15  ALKPHOS 99  BILITOT 0.7   ------------------------------------------------------------------------------------------------------------------  Cardiac Enzymes Recent Labs  Lab 07/12/17 0636  TROPONINI 0.03*   ------------------------------------------------------------  RADIOLOGY:   No results found for this or any previous visit. Results for orders placed during the hospital encounter of 10/26/16  DG Chest 2 View   Narrative CLINICAL DATA:  COPD.  Difficulty breathing.  EXAM: CHEST  2 VIEW  COMPARISON:  11/07/2015  FINDINGS: The cardiac silhouette, mediastinal and hilar contours are within normal limits and stable. Mild tortuosity of the thoracic aorta. Stable  aortic valve repair/stent. Chronic emphysematous and bronchitic type lung changes without acute overlying pulmonary process. Known edema, infiltrates or effusions. The bony thorax is intact.  IMPRESSION: Chronic lung changes.  No acute overlying pulmonary process.   Electronically Signed   By: Rudie Meyer M.D.   On: 10/26/2016 08:08    ------------------------------------------------------------------------------------------------------------------  Thank  you for allowing [facility] to assist in the care of your patient. Our recommendations are noted above.  Please contact us if we can be of further service.   Wells Guiles, MD.  [facility] Office Number: [phone]  Santiago Glad, M.D.  Billy Fischer, M.D  07/14/2017

## 2017-07-14 NOTE — Progress Notes (Signed)
Patient stable for discharge.   PIV discontinued, catheter intact; site clean, dry, intact. Discharge instructions and follow-up reviewed with patient and daughter by previous RN. Patient and daughter had no question when this RN asked.   DNR form signed and given to patient.  Prednisone scrip given to patient.  Family at bedside to take patient home.  NAD noted when wheeling patient down to car.

## 2017-07-14 NOTE — Progress Notes (Signed)
[facility]  ROUNDING NOTE   Subjective:  Patient seen at bedside. Due for hemodialysis today. Still having a cough.  Objective:  Vital signs in last 24 hours:  Temp:  [98.3 F (36.8 C)-98.6 F (37 C)] 98.3 F (36.8 C) (03/27 0810) Pulse Rate:  [103-112] 103 (03/27 0810) Resp:  [17-22] 21 (03/27 0400) BP: (135-140)/(64-72) 137/65 (03/27 0810) SpO2:  [96 %-99 %] 97 % (03/27 0810)  Weight change:  Filed Weights   07/12/17 0948 07/12/17 1730 07/13/17 0451  Weight: 103.3 kg (227 lb 12.8 oz) 103 kg (227 lb 1.2 oz) 102.5 kg (225 lb 14.4 oz)    Intake/Output: I/O last 3 completed shifts: In: 720 [P.O.:720] Out: 0    Intake/Output this shift:  Total I/O In: 240 [P.O.:240] Out: -   Physical Exam: General: No acute distress  Head: Normocephalic, atraumatic. Moist oral mucosal membranes  Eyes: Anicteric  Neck: Supple, trachea midline  Lungs:  Scattered rhonchi and wheezes, normal effort  Heart: S1S2 no rubs  Abdomen:  Soft, nontender, bowel sounds present  Extremities: 1+ peripheral edema.  Neurologic: Awake, alert, following commands  Skin: No lesions  Access: LUE AVF    Basic Metabolic Panel: Recent Labs  Lab 07/12/17 0636 07/12/17 1456 07/13/17 0439  NA 138  --  139  K 4.6  --  3.8  CL 101  --  97*  CO2 20*  --  28  GLUCOSE 99  --  192*  BUN 44*  --  38*  CREATININE 9.21*  --  6.24*  CALCIUM 7.9*  --  8.6*  PHOS  --  3.9  --     Liver Function Tests: Recent Labs  Lab 07/13/17 0439  AST 32  ALT 15  ALKPHOS 99  BILITOT 0.7  PROT 7.1  ALBUMIN 3.6   No results for input(s): LIPASE, AMYLASE in the last 168 hours. No results for input(s): AMMONIA in the last 168 hours.  CBC: Recent Labs  Lab 07/12/17 0636 07/13/17 0439  WBC 6.8 5.7  NEUTROABS 5.9  --   HGB 11.1* 11.0*  HCT 34.4* 33.2*  MCV 106.8* 105.6*  PLT 137* 112*    Cardiac Enzymes: Recent Labs  Lab 07/12/17 0636  TROPONINI 0.03*    BNP: Invalid input(s):  POCBNP  CBG: No results for input(s): GLUCAP in the last 168 hours.  Microbiology: Results for orders placed or performed during the hospital encounter of 07/12/17  MRSA PCR Screening     Status: None   Collection Time: 07/12/17 10:09 AM  Result Value Ref Range Status   MRSA by PCR NEGATIVE NEGATIVE Final    Comment:        The GeneXpert MRSA Assay (FDA approved for NASAL specimens only), is one component of a comprehensive MRSA colonization surveillance program. It is not intended to diagnose MRSA infection nor to guide or monitor treatment for MRSA infections. Performed at [facility], [address] [place], [place] [address]     Coagulation Studies: No results for input(s): LABPROT, INR in the last 72 hours.  Urinalysis: No results for input(s): COLORURINE, LABSPEC, PHURINE, GLUCOSEU, HGBUR, BILIRUBINUR, KETONESUR, PROTEINUR, UROBILINOGEN, NITRITE, LEUKOCYTESUR in the last 72 hours.  Invalid input(s): APPERANCEUR    Imaging: No results found.   Medications:    . apixaban  2.5 mg Oral BID  . atorvastatin  40 mg Oral q1800  . azithromycin  250 mg Oral Daily  . benzonatate  200 mg Oral TID  . calcium  acetate  1,334 mg Oral TID WC  . clopidogrel  75 mg Oral Daily  . docusate sodium  100 mg Oral BID  . famotidine  20 mg Oral QPM  . ipratropium-albuterol  3 mL Nebulization Q6H  . mouth rinse  15 mL Mouth Rinse BID  . methylPREDNISolone (SOLU-MEDROL) injection  60 mg Intravenous Q6H  . multivitamin with minerals  1 tablet Oral Daily  . pantoprazole  40 mg Oral Daily  . sertraline  50 mg Oral Daily  . sodium chloride flush  3 mL Intravenous Q12H  . traZODone  50 mg Oral QHS   acetaminophen, albuterol, ALPRAZolam, calcium acetate, diphenhydrAMINE, guaiFENesin-codeine, lidocaine-prilocaine, ondansetron **OR** ondansetron (ZOFRAN) IV, traMADol  Assessment/ Plan:  72 y.o. female with chronic diastolic congestive heart failure, COPD requiring oxygen  supplementation by nasal cannula, end-stage renal disease, GERD, history of GI bleed, history of DVT in the leg, hypertension, history of transcatheter aortic valve replacement for severe aortic stenosis  [facility]/TTS/Dr. Kolluru/ LUE AVF  1.  End-stage renal disease: Patient due for hemodialysis today.  Orders have been prepared.  2.  Anemia of chronic kidney disease Continue to hold Epogen at this time as hemoglobin is stable at 11.  3.  Secondary hyperparathyroidism Obtain the patient on current dosage of calcium acetate and follow-up serum phosphorus today.  4.  Shortness of breath/chronic persistent cough.  Continue nebulizers, steroids, supplemental oxygen, and cough suppressants.     LOS: 2 Ariel Smith 3/27/201912:15 PM

## 2017-07-14 NOTE — Progress Notes (Signed)
HD Tx Initiated w/o complication.   07/14/17 1615  Vital Signs  Temp 98.7 F (37.1 C)  Temp Source Axillary  Pulse Rate 95  Pulse Rate Source Monitor  Resp 20  BP (!) 146/79  BP Location Right Arm  BP Method Automatic  Patient Position (if appropriate) Lying  Oxygen Therapy  SpO2 99 %  O2 Device Nasal Cannula  O2 Flow Rate (L/min) 3 L/min  Pain Assessment  Pain Scale 0-10  Pain Score 0  Dialysis Weight  Weight 102.9 kg (226 lb 13.7 oz)  Type of Weight Pre-Dialysis  Time-Out for Hemodialysis  What Procedure? HD   Pt Identifiers(min of two) First/Last Name;MRN/Account#  Correct Site? Yes  Correct Side? Yes  Correct Procedure? Yes  Consents Verified? Yes  Rad Studies Available? N/A  Safety Precautions Reviewed? Yes  Biochemist, clinical Number [phone]  Station Number 1  UF/Alarm Test Passed  Conductivity: Meter 14  Conductivity: Machine  14  pH 7.4  Reverse Osmosis Main  Normal Saline Lot Number [id number]  Dialyzer Lot Number [id number]  Disposable Set Lot Number [id number]  Machine Temperature 98.6 F (37 C)  Immunologist and Audible Yes  Pre Treatment Patient Checks  Vascular access used during treatment Fistula  Hepatitis B Surface Antigen Results Negative  Hepatitis B Surface Antibody  (>10)  Date Hepatitis B Surface Antibody Drawn 07/15/16  Hemodialysis Consent Verified Yes  Hemodialysis Standing Orders Initiated Yes  ECG (Telemetry) Monitor On Yes  Prime Ordered Normal Saline  Length of  DialysisTreatment -hour(s) 3.5 Hour(s)  Dialysis Treatment Comments Na 140  Dialyzer Elisio 17H NR  Dialysate 2K, 2.5 Ca  Dialysis Anticoagulant None  Dialysate Flow Ordered 800  Blood Flow Rate Ordered 400 mL/min  During Hemodialysis Assessment  Blood Flow Rate (mL/min) 400 mL/min  Arterial Pressure (mmHg) -200 mmHg  Venous Pressure (mmHg) 170 mmHg  Transmembrane Pressure (mmHg) 70 mmHg  Ultrafiltration Rate (mL/min) 1210 mL/min  Dialysate Flow Rate (mL/min) 800  ml/min  Conductivity: Machine  14  HD Safety Checks Performed Yes  Dialysis Fluid Bolus Normal Saline  Bolus Amount (mL) 250 mL  Intra-Hemodialysis Comments Tx initiated  Fistula / Graft Left Forearm Arteriovenous fistula  No Placement Date or Time found.   Placed prior to admission: Yes  Orientation: Left  Access Location: Forearm  Access Type: Arteriovenous fistula  Site Condition No complications  Fistula / Graft Assessment Present;Thrill;Bruit  Status Accessed  Needle Size 15 g  Drainage Description None

## 2017-07-14 NOTE — Care Management Important Message (Signed)
Important Message  Patient Details  Name: Ariel Smith MRN: [id number] Date of Birth: [birth year]   Medicare Important Message Given:  N/A - LOS <3 / Initial given by admissions    Eber Hong, RN 07/14/2017, 3:02 PM

## 2017-07-14 NOTE — Progress Notes (Signed)
Post HD Tx, pt signed off AMA to be discharged in time for ride. TX tolerated well, no complications. UF goal Met.Pt stable to return to room.    07/14/17 1830  Vital Signs  Pulse Rate 95  Pulse Rate Source Monitor  Resp 17  BP (!) 142/91  BP Location Right Arm  BP Method Automatic  Patient Position (if appropriate) Lying  Oxygen Therapy  SpO2 99 %  O2 Device Nasal Cannula  O2 Flow Rate (L/min) 3 L/min  Pulse Oximetry Type Continuous  Pain Assessment  Pain Scale 0-10  Pain Score 0  Dialysis Weight  Weight 101.2 kg (223 lb 1.7 oz)  Type of Weight Post-Dialysis  Fistula / Graft Left Forearm Arteriovenous fistula  No Placement Date or Time found.   Placed prior to admission: Yes  Orientation: Left  Access Location: Forearm  Access Type: Arteriovenous fistula  Site Condition No complications

## 2017-07-14 NOTE — Plan of Care (Signed)
  Problem: Education: Goal: Knowledge of General Education information will improve Outcome: Progressing   Problem: Education: Goal: Knowledge of disease or condition will improve Outcome: Progressing Goal: Knowledge of the prescribed therapeutic regimen will improve Outcome: Progressing   Problem: Respiratory: Goal: Ability to maintain a clear airway will improve Outcome: Progressing Goal: Levels of oxygenation will improve Outcome: Progressing Goal: Ability to maintain adequate ventilation will improve Outcome: Progressing   

## 2017-07-16 ENCOUNTER — Telehealth: Payer: Self-pay

## 2017-07-16 NOTE — Telephone Encounter (Signed)
Received faxed PA for alprazolam 0.5 mg ODT to be completed and faxed. Placed in Dr. Timoteo Expose box.

## 2017-07-18 ENCOUNTER — Other Ambulatory Visit: Payer: Self-pay | Admitting: Cardiovascular Disease

## 2017-07-19 ENCOUNTER — Telehealth: Payer: Self-pay

## 2017-07-19 ENCOUNTER — Encounter: Payer: Self-pay | Admitting: Family Medicine

## 2017-07-19 NOTE — Telephone Encounter (Signed)
Filled and in Lisa's box 

## 2017-07-19 NOTE — Telephone Encounter (Signed)
Spoke with [facility]- [address] notifying them the PA was approved. Says rx went through and they will fill for the pt.

## 2017-07-19 NOTE — Telephone Encounter (Signed)
Copied from CRM [id number]. Topic: General - Other >> Jul 19, 2017  3:53 PM Percival Spanish wrote:  Ariel Smith with [facility] Medicare call to say the below PA has been approved for one year     ALPRAZolam (NIRAVAM) 0.5 MG dissolvable tablet

## 2017-07-19 NOTE — Telephone Encounter (Signed)
Faxed form.

## 2017-07-20 ENCOUNTER — Encounter: Payer: Self-pay | Admitting: Family Medicine

## 2017-07-20 ENCOUNTER — Ambulatory Visit: Payer: Medicare Other | Admitting: Family Medicine

## 2017-07-20 VITALS — BP 122/70 | HR 76 | Temp 97.9°F | Wt 225.0 lb

## 2017-07-20 DIAGNOSIS — R05 Cough: Secondary | ICD-10-CM | POA: Diagnosis not present

## 2017-07-20 DIAGNOSIS — J209 Acute bronchitis, unspecified: Secondary | ICD-10-CM

## 2017-07-20 DIAGNOSIS — F419 Anxiety disorder, unspecified: Secondary | ICD-10-CM | POA: Diagnosis not present

## 2017-07-20 DIAGNOSIS — Z952 Presence of prosthetic heart valve: Secondary | ICD-10-CM

## 2017-07-20 DIAGNOSIS — N186 End stage renal disease: Secondary | ICD-10-CM | POA: Diagnosis not present

## 2017-07-20 DIAGNOSIS — R053 Chronic cough: Secondary | ICD-10-CM

## 2017-07-20 NOTE — Assessment & Plan Note (Signed)
This continues slowly improving with treatment to date. Reviewed duoneb use - rec scheduled while she finishes prednisone taper then change to PRN. Keep upcoming f/u with pulm.

## 2017-07-20 NOTE — Progress Notes (Signed)
BP 122/70 (BP Location: Right Arm, Patient Position: Sitting, Cuff Size: Large)   Pulse 76   Temp 97.9 F (36.6 C) (Oral)   Wt 225 lb (102.1 kg)   SpO2 98% Comment: 3 L, pulsating  BMI 38.62 kg/m    CC: hosp f/u visit Subjective:    Patient ID: Ariel Smith, female    DOB: June 06, 1945, 72 y.o.   MRN: [id number]  HPI: Ariel Smith is a 72 y.o. female presenting on 07/20/2017 for Hospitalization Follow-up (Admitted to [facility] on 07/12/17, dx cough.)   Here with daughter today.   Recent hospitalization for acute shortness of breath and cough with elevated BNP, treated for acute cough with acute bronchitis with IV solumedrol and bronchodilator nebulizer treatment. S/p hemodialysis in hospital. Discharged with azithromycin and prednisone taper. She has also been using duoneb bid at home. She is overall feeling better, cough continues.   Daughter thinks atorvastatin was contributing to her chronic nagging cough.  Upcoming appt with Dr Nicholos Johns scheduled 08/02/2017.  No fevers, chest pain. Dyspnea has improved.   Date of admission: 07/12/2017 Date of discharge: 07/14/2017 No TCM hospital f/u phone call performed  D/C diagnosis:  1.  Hypoxia resolved 2.  Chronic respiratory failure 3.  COPD exacerbation resolved 4.  Congestive heart failure Diastolic 5.  End-stage renal disease on dialysis 6.  History of DVT and PE recurrent Status post IVC filter in July 2016 Status post lower extremity thrombectomy in July 2017  Consults: renal, pulm. Discharged home.   Relevant past medical, surgical, family and social history reviewed and updated as indicated. Interim medical history since our last visit reviewed. Allergies and medications reviewed and updated. Outpatient Medications Prior to Visit  Medication Sig Dispense Refill  . albuterol (VENTOLIN HFA) 108 (90 BASE) MCG/ACT inhaler Inhale 2 puffs into the lungs every 4 (four) hours as needed for wheezing or shortness of breath. 18 g 3   . ALPRAZolam (NIRAVAM) 0.5 MG dissolvable tablet TAKE ONE-HALF TO ONE TABLET BY MOUTH ONCE DAILY AS NEEDED FOR ANXIETY 30 tablet 0  . atorvastatin (LIPITOR) 40 MG tablet TAKE 1 TABLET BY MOUTH ONCE DAILY AT  6PM. NEEDS OFFICE VISIT. 90 tablet 0  . calcium acetate (PHOSLO) 667 MG capsule Take 1,334 mg by mouth 3 (three) times daily with meals. Take 2 with meals and snack    . clopidogrel (PLAVIX) 75 MG tablet TAKE 1 TABLET BY MOUTH ONCE DAILY WITH BREAKFAST 90 tablet 0  . diphenhydrAMINE (BENADRYL) 25 MG tablet Take 25 mg by mouth every 6 (six) hours as needed.    Marland Kitchen ELIQUIS 2.5 MG TABS tablet TAKE 1 TABLET BY MOUTH TWICE DAILY 60 tablet 6  . famotidine (PEPCID) 40 MG tablet TAKE 1 TABLET BY MOUTH IN THE EVENING 30 tablet 5  . ipratropium-albuterol (DUONEB) 0.5-2.5 (3) MG/3ML SOLN Take 3 mLs by nebulization every 6 (six) hours as needed (shortness of breath/ wheezing). 360 mL 1  . lidocaine-prilocaine (EMLA) cream Apply 1 application topically as needed (topical anesthesia for hemodialysis if Gebauers and Lidocaine injection are ineffective.). 30 g 0  . Multiple Vitamin (MULTIVITAMIN WITH MINERALS) TABS tablet Take 1 tablet by mouth daily.    . predniSONE (DELTASONE) 10 MG tablet Take 1 tablet (10 mg total) by mouth daily. Label  & dispense according to the schedule below.  6 tablets day one, then 5 table day 2, then 4 tablets day 3, then 3 tablets day 4, 2 tablets day 5, then 1 tablet day  6, then stop 21 tablet 0  . sertraline (ZOLOFT) 50 MG tablet Take 1 tablet (50 mg total) by mouth daily. 30 tablet 6  . traMADol (ULTRAM) 50 MG tablet Take 0.5-1 tablets (25-50 mg total) by mouth 2 (two) times daily as needed. 50 tablet 0  . traZODone (DESYREL) 50 MG tablet TAKE 1 & 1/2 (ONE & ONE-HALF) TABLETS BY MOUTH AT BEDTIME (Patient taking differently: 2 tabs every night) 135 tablet 1  . azithromycin (ZITHROMAX) 250 MG tablet Take orally once daily 2 each 2   No facility-administered medications prior to  visit.      Per HPI unless specifically indicated in ROS section below Review of Systems     Objective:    BP 122/70 (BP Location: Right Arm, Patient Position: Sitting, Cuff Size: Large)   Pulse 76   Temp 97.9 F (36.6 C) (Oral)   Wt 225 lb (102.1 kg)   SpO2 98% Comment: 3 L, pulsating  BMI 38.62 kg/m   Wt Readings from Last 3 Encounters:  07/20/17 225 lb (102.1 kg)  07/14/17 223 lb 1.7 oz (101.2 kg)  06/28/17 227 lb (103 kg)    Physical Exam  Constitutional: She appears well-developed and well-nourished. No distress.  [place] in place Using cane  HENT:  Mouth/Throat: No oropharyngeal exudate.  Dry MM  Cardiovascular: Normal rate, regular rhythm, normal heart sounds and intact distal pulses.  No murmur heard. Pulmonary/Chest: Effort normal and breath sounds normal. No respiratory distress. She has no wheezes. She has no rales.  Lungs largely clear, dry cough present  Musculoskeletal: She exhibits no edema.  Psychiatric: She has a normal mood and affect.  Nursing note and vitals reviewed.  Results for orders placed or performed during the hospital encounter of 07/12/17  MRSA PCR Screening  Result Value Ref Range   MRSA by PCR NEGATIVE NEGATIVE  CBC with Differential  Result Value Ref Range   WBC 6.8 3.6 - 11.0 K/uL   RBC 3.22 (L) 3.80 - 5.20 MIL/uL   Hemoglobin 11.1 (L) 12.0 - 16.0 g/dL   HCT 69.6 (L) 29.5 - 28.4 %   MCV 106.8 (H) 80.0 - 100.0 fL   MCH 34.5 (H) 26.0 - 34.0 pg   MCHC 32.3 32.0 - 36.0 g/dL   RDW 13.2 44.0 - 10.2 %   Platelets 137 (L) 150 - 440 K/uL   Neutrophils Relative % 88 %   Neutro Abs 5.9 1.4 - 6.5 K/uL   Lymphocytes Relative 5 %   Lymphs Abs 0.3 (L) 1.0 - 3.6 K/uL   Monocytes Relative 7 %   Monocytes Absolute 0.5 0.2 - 0.9 K/uL   Eosinophils Relative 0 %   Eosinophils Absolute 0.0 0 - 0.7 K/uL   Basophils Relative 0 %   Basophils Absolute 0.0 0 - 0.1 K/uL  Basic metabolic panel  Result Value Ref Range   Sodium 138 135 - 145 mmol/L    Potassium 4.6 3.5 - 5.1 mmol/L   Chloride 101 101 - 111 mmol/L   CO2 20 (L) 22 - 32 mmol/L   Glucose, Bld 99 65 - 99 mg/dL   BUN 44 (H) 6 - 20 mg/dL   Creatinine, Ser 7.25 (H) 0.44 - 1.00 mg/dL   Calcium 7.9 (L) 8.9 - 10.3 mg/dL   GFR calc non Af Amer 4 (L) >60 mL/min   GFR calc Af Amer 4 (L) >60 mL/min   Anion gap 17 (H) 5 - 15  Brain natriuretic peptide  Result Value  Ref Range   B Natriuretic Peptide 849.0 (H) 0.0 - 100.0 pg/mL  Troponin I  Result Value Ref Range   Troponin I 0.03 (HH) <0.03 ng/mL  Phosphorus  Result Value Ref Range   Phosphorus 3.9 2.5 - 4.6 mg/dL  Parathyroid hormone, intact (no Ca)  Result Value Ref Range   PTH 420 (H) 15 - 65 pg/mL  CBC  Result Value Ref Range   WBC 5.7 3.6 - 11.0 K/uL   RBC 3.15 (L) 3.80 - 5.20 MIL/uL   Hemoglobin 11.0 (L) 12.0 - 16.0 g/dL   HCT 00.3 (L) 49.1 - 79.1 %   MCV 105.6 (H) 80.0 - 100.0 fL   MCH 35.1 (H) 26.0 - 34.0 pg   MCHC 33.3 32.0 - 36.0 g/dL   RDW 50.5 69.7 - 94.8 %   Platelets 112 (L) 150 - 440 K/uL  Comprehensive metabolic panel  Result Value Ref Range   Sodium 139 135 - 145 mmol/L   Potassium 3.8 3.5 - 5.1 mmol/L   Chloride 97 (L) 101 - 111 mmol/L   CO2 28 22 - 32 mmol/L   Glucose, Bld 192 (H) 65 - 99 mg/dL   BUN 38 (H) 6 - 20 mg/dL   Creatinine, Ser 0.16 (H) 0.44 - 1.00 mg/dL   Calcium 8.6 (L) 8.9 - 10.3 mg/dL   Total Protein 7.1 6.5 - 8.1 g/dL   Albumin 3.6 3.5 - 5.0 g/dL   AST 32 15 - 41 U/L   ALT 15 14 - 54 U/L   Alkaline Phosphatase 99 38 - 126 U/L   Total Bilirubin 0.7 0.3 - 1.2 mg/dL   GFR calc non Af Amer 6 (L) >60 mL/min   GFR calc Af Amer 7 (L) >60 mL/min   Anion gap 14 5 - 15      Assessment & Plan:   Problem List Items Addressed This Visit    Acute bronchitis - Primary    This continues slowly improving with treatment to date. Reviewed duoneb use - rec scheduled while she finishes prednisone taper then change to PRN. Keep upcoming f/u with pulm.       Anxiety    Ongoing anxiety around  dialysis. Discussed xanax use.       Chronic cough    Daughter suspicious for atorvastatin causing chronic cough - she has held and states cough is improved. I encouraged retrial atorvastatin once acute bronchitis has resolved and update [place] with effect, consider changing statin if cough recurs.       End stage renal disease (HCC) (Chronic)   S/P TAVR (transcatheter aortic valve replacement)       No orders of the defined types were placed in this encounter.  No orders of the defined types were placed in this encounter.   Follow up plan: Return if symptoms worsen or fail to improve.  [name], MD

## 2017-07-20 NOTE — Assessment & Plan Note (Signed)
Ongoing anxiety around dialysis. Discussed xanax use.

## 2017-07-20 NOTE — Patient Instructions (Addendum)
You are doing well today. Continue current medicines.  duonebs twice daily for next 2 days then change to just as needed. Keep follow up with pulmonology.  Stay off lipitor, then once cough has improved, retrial and update me with effect.

## 2017-07-20 NOTE — Assessment & Plan Note (Signed)
Daughter suspicious for atorvastatin causing chronic cough - she has held and states cough is improved. I encouraged retrial atorvastatin once acute bronchitis has resolved and update [place] with effect, consider changing statin if cough recurs.

## 2017-07-28 ENCOUNTER — Inpatient Hospital Stay
Admission: EM | Admit: 2017-07-28 | Discharge: 2017-07-30 | DRG: 291 | Disposition: A | Discharge status: Home / Self Care | Payer: Medicare Other | Attending: Family Medicine | Admitting: Family Medicine

## 2017-07-28 ENCOUNTER — Emergency Department: Payer: Medicare Other

## 2017-07-28 ENCOUNTER — Other Ambulatory Visit: Payer: Self-pay

## 2017-07-28 DIAGNOSIS — J9601 Acute respiratory failure with hypoxia: Secondary | ICD-10-CM | POA: Diagnosis present

## 2017-07-28 DIAGNOSIS — Z9981 Dependence on supplemental oxygen: Secondary | ICD-10-CM

## 2017-07-28 DIAGNOSIS — Z8249 Family history of ischemic heart disease and other diseases of the circulatory system: Secondary | ICD-10-CM

## 2017-07-28 DIAGNOSIS — I132 Hypertensive heart and chronic kidney disease with heart failure and with stage 5 chronic kidney disease, or end stage renal disease: Secondary | ICD-10-CM | POA: Diagnosis not present

## 2017-07-28 DIAGNOSIS — J810 Acute pulmonary edema: Secondary | ICD-10-CM | POA: Diagnosis present

## 2017-07-28 DIAGNOSIS — D631 Anemia in chronic kidney disease: Secondary | ICD-10-CM | POA: Diagnosis present

## 2017-07-28 DIAGNOSIS — I5033 Acute on chronic diastolic (congestive) heart failure: Secondary | ICD-10-CM | POA: Diagnosis present

## 2017-07-28 DIAGNOSIS — R0602 Shortness of breath: Secondary | ICD-10-CM

## 2017-07-28 DIAGNOSIS — N186 End stage renal disease: Secondary | ICD-10-CM | POA: Diagnosis present

## 2017-07-28 DIAGNOSIS — Z953 Presence of xenogenic heart valve: Secondary | ICD-10-CM

## 2017-07-28 DIAGNOSIS — Z886 Allergy status to analgesic agent status: Secondary | ICD-10-CM

## 2017-07-28 DIAGNOSIS — Z86718 Personal history of other venous thrombosis and embolism: Secondary | ICD-10-CM

## 2017-07-28 DIAGNOSIS — I350 Nonrheumatic aortic (valve) stenosis: Secondary | ICD-10-CM | POA: Diagnosis present

## 2017-07-28 DIAGNOSIS — Z6838 Body mass index (BMI) 38.0-38.9, adult: Secondary | ICD-10-CM

## 2017-07-28 DIAGNOSIS — I340 Nonrheumatic mitral (valve) insufficiency: Secondary | ICD-10-CM | POA: Diagnosis not present

## 2017-07-28 DIAGNOSIS — M1990 Unspecified osteoarthritis, unspecified site: Secondary | ICD-10-CM | POA: Diagnosis present

## 2017-07-28 DIAGNOSIS — N2581 Secondary hyperparathyroidism of renal origin: Secondary | ICD-10-CM | POA: Diagnosis present

## 2017-07-28 DIAGNOSIS — J449 Chronic obstructive pulmonary disease, unspecified: Secondary | ICD-10-CM | POA: Diagnosis present

## 2017-07-28 DIAGNOSIS — Z7902 Long term (current) use of antithrombotics/antiplatelets: Secondary | ICD-10-CM

## 2017-07-28 DIAGNOSIS — Z96652 Presence of left artificial knee joint: Secondary | ICD-10-CM | POA: Diagnosis present

## 2017-07-28 DIAGNOSIS — M8580 Other specified disorders of bone density and structure, unspecified site: Secondary | ICD-10-CM | POA: Diagnosis present

## 2017-07-28 DIAGNOSIS — Z87891 Personal history of nicotine dependence: Secondary | ICD-10-CM

## 2017-07-28 DIAGNOSIS — Z9049 Acquired absence of other specified parts of digestive tract: Secondary | ICD-10-CM

## 2017-07-28 DIAGNOSIS — Z992 Dependence on renal dialysis: Secondary | ICD-10-CM | POA: Diagnosis not present

## 2017-07-28 DIAGNOSIS — E785 Hyperlipidemia, unspecified: Secondary | ICD-10-CM | POA: Diagnosis present

## 2017-07-28 DIAGNOSIS — K219 Gastro-esophageal reflux disease without esophagitis: Secondary | ICD-10-CM | POA: Diagnosis present

## 2017-07-28 DIAGNOSIS — Z86711 Personal history of pulmonary embolism: Secondary | ICD-10-CM | POA: Diagnosis not present

## 2017-07-28 DIAGNOSIS — Z803 Family history of malignant neoplasm of breast: Secondary | ICD-10-CM

## 2017-07-28 DIAGNOSIS — Z66 Do not resuscitate: Secondary | ICD-10-CM | POA: Diagnosis present

## 2017-07-28 DIAGNOSIS — Z7901 Long term (current) use of anticoagulants: Secondary | ICD-10-CM

## 2017-07-28 DIAGNOSIS — Z86010 Personal history of colonic polyps: Secondary | ICD-10-CM | POA: Diagnosis not present

## 2017-07-28 DIAGNOSIS — Z823 Family history of stroke: Secondary | ICD-10-CM

## 2017-07-28 DIAGNOSIS — Z79899 Other long term (current) drug therapy: Secondary | ICD-10-CM | POA: Diagnosis not present

## 2017-07-28 LAB — CBC WITH DIFFERENTIAL/PLATELET
Basophils Absolute: 0.1 K/uL (ref 0–0.1)
Basophils Relative: 1 %
Eosinophils Absolute: 0.1 K/uL (ref 0–0.7)
Eosinophils Relative: 1 %
HCT: 29.6 % — ABNORMAL LOW (ref 35.0–47.0)
Hemoglobin: 9.5 g/dL — ABNORMAL LOW (ref 12.0–16.0)
Lymphocytes Relative: 6 %
Lymphs Abs: 0.9 K/uL — ABNORMAL LOW (ref 1.0–3.6)
MCH: 33.9 pg (ref 26.0–34.0)
MCHC: 32.1 g/dL (ref 32.0–36.0)
MCV: 105.8 fL — ABNORMAL HIGH (ref 80.0–100.0)
Monocytes Absolute: 0.9 K/uL (ref 0.2–0.9)
Monocytes Relative: 7 %
Neutro Abs: 11.8 K/uL — ABNORMAL HIGH (ref 1.4–6.5)
Neutrophils Relative %: 85 %
Platelets: 99 K/uL — ABNORMAL LOW (ref 150–440)
RBC: 2.8 MIL/uL — ABNORMAL LOW (ref 3.80–5.20)
RDW: 14.6 % — ABNORMAL HIGH (ref 11.5–14.5)
WBC: 13.7 K/uL — ABNORMAL HIGH (ref 3.6–11.0)

## 2017-07-28 LAB — PHOSPHORUS: PHOSPHORUS: 3 mg/dL (ref 2.5–4.6)

## 2017-07-28 LAB — COMPREHENSIVE METABOLIC PANEL
ALT: 13 U/L — AB (ref 14–54)
AST: 19 U/L (ref 15–41)
Albumin: 3.3 g/dL — ABNORMAL LOW (ref 3.5–5.0)
Alkaline Phosphatase: 90 U/L (ref 38–126)
Anion gap: 14 (ref 5–15)
BILIRUBIN TOTAL: 0.7 mg/dL (ref 0.3–1.2)
BUN: 85 mg/dL — AB (ref 6–20)
CHLORIDE: 103 mmol/L (ref 101–111)
CO2: 23 mmol/L (ref 22–32)
CREATININE: 11.05 mg/dL — AB (ref 0.44–1.00)
Calcium: 8 mg/dL — ABNORMAL LOW (ref 8.9–10.3)
GFR, EST AFRICAN AMERICAN: 4 mL/min — AB (ref >60)
GFR, EST NON AFRICAN AMERICAN: 3 mL/min — AB (ref >60)
Glucose, Bld: 122 mg/dL — ABNORMAL HIGH (ref 65–99)
Potassium: 4.5 mmol/L (ref 3.5–5.1)
Sodium: 140 mmol/L (ref 135–145)
TOTAL PROTEIN: 6.2 g/dL — AB (ref 6.5–8.1)

## 2017-07-28 LAB — TROPONIN I: Troponin I: 0.05 ng/mL

## 2017-07-28 LAB — BRAIN NATRIURETIC PEPTIDE: B NATRIURETIC PEPTIDE 5: 704 pg/mL — AB (ref 0.0–100.0)

## 2017-07-28 LAB — URINALYSIS, COMPLETE (UACMP) WITH MICROSCOPIC
BACTERIA UA: NONE SEEN
BILIRUBIN URINE: NEGATIVE
GLUCOSE, UA: 150 mg/dL — AB
KETONES UR: NEGATIVE mg/dL
LEUKOCYTES UA: NEGATIVE
NITRITE: NEGATIVE
PH: 9 — AB (ref 5.0–8.0)
Protein, ur: 30 mg/dL — AB
SPECIFIC GRAVITY, URINE: 1.013 (ref 1.005–1.030)

## 2017-07-28 LAB — GLUCOSE, CAPILLARY: Glucose-Capillary: 198 mg/dL — ABNORMAL HIGH (ref 65–99)

## 2017-07-28 LAB — HEMOGLOBIN A1C
Hgb A1c MFr Bld: 5.6 % (ref 4.8–5.6)
Mean Plasma Glucose: 114.02 mg/dL

## 2017-07-28 LAB — MRSA PCR SCREENING: MRSA BY PCR: NEGATIVE

## 2017-07-28 LAB — TSH: TSH: 2.146 u[IU]/mL (ref 0.350–4.500)

## 2017-07-28 MED ORDER — ONDANSETRON HCL 4 MG PO TABS: 4.0000 mg | ORAL_TABLET | Freq: Four times a day (QID) | ORAL | Status: DC | PRN

## 2017-07-28 MED ORDER — PANTOPRAZOLE SODIUM 40 MG PO TBEC
40.0000 mg | DELAYED_RELEASE_TABLET | Freq: Every day | ORAL | Status: DC
  Administered 2017-07-28 – 2017-07-29 (×2): 40 mg via ORAL
  Filled 2017-07-28 (×2): qty 1

## 2017-07-28 MED ORDER — DOCUSATE SODIUM 100 MG PO CAPS
100.0000 mg | ORAL_CAPSULE | Freq: Two times a day (BID) | ORAL | Status: DC
  Administered 2017-07-28 – 2017-07-29 (×4): 100 mg via ORAL
  Filled 2017-07-28 (×4): qty 1

## 2017-07-28 MED ORDER — LIDOCAINE-PRILOCAINE 2.5-2.5 % EX CREA
1.0000 "application " | TOPICAL_CREAM | CUTANEOUS | Status: DC | PRN
  Filled 2017-07-28: qty 5

## 2017-07-28 MED ORDER — DIPHENHYDRAMINE HCL 50 MG/ML IJ SOLN
25.0000 mg | Freq: Once | INTRAMUSCULAR | Status: AC
  Administered 2017-07-28: 25 mg via INTRAVENOUS

## 2017-07-28 MED ORDER — TORSEMIDE 100 MG PO TABS
100.0000 mg | ORAL_TABLET | Freq: Every day | ORAL | Status: DC
  Administered 2017-07-29: 100 mg via ORAL
  Filled 2017-07-28: qty 1
  Filled 2017-07-28: qty 5

## 2017-07-28 MED ORDER — SERTRALINE HCL 50 MG PO TABS
50.0000 mg | ORAL_TABLET | Freq: Every day | ORAL | Status: DC
  Administered 2017-07-28 – 2017-07-30 (×3): 50 mg via ORAL
  Filled 2017-07-28 (×3): qty 1

## 2017-07-28 MED ORDER — FAMOTIDINE 20 MG PO TABS
40.0000 mg | ORAL_TABLET | Freq: Every evening | ORAL | Status: DC
  Administered 2017-07-28 – 2017-07-29 (×2): 40 mg via ORAL
  Filled 2017-07-28 (×2): qty 2

## 2017-07-28 MED ORDER — CINACALCET HCL 30 MG PO TABS
30.0000 mg | ORAL_TABLET | Freq: Every day | ORAL | Status: DC
  Administered 2017-07-28 – 2017-07-29 (×2): 30 mg via ORAL
  Filled 2017-07-28 (×3): qty 1

## 2017-07-28 MED ORDER — CALCIUM ACETATE 667 MG PO CAPS
1334.0000 mg | ORAL_CAPSULE | Freq: Three times a day (TID) | ORAL | Status: DC
  Administered 2017-07-28 – 2017-07-30 (×4): 1334 mg via ORAL
  Filled 2017-07-28 (×8): qty 2

## 2017-07-28 MED ORDER — NEPRO/CARBSTEADY PO LIQD
237.0000 mL | Freq: Two times a day (BID) | ORAL | Status: DC
Start: 2017-07-29 — End: 2017-07-30
  Administered 2017-07-29: 237 mL via ORAL

## 2017-07-28 MED ORDER — FUROSEMIDE 10 MG/ML IJ SOLN
40.0000 mg | Freq: Once | INTRAMUSCULAR | Status: AC
  Administered 2017-07-28: 40 mg via INTRAVENOUS
  Filled 2017-07-28: qty 4

## 2017-07-28 MED ORDER — APIXABAN 2.5 MG PO TABS
2.5000 mg | ORAL_TABLET | Freq: Two times a day (BID) | ORAL | Status: DC
  Administered 2017-07-28 – 2017-07-30 (×4): 2.5 mg via ORAL
  Filled 2017-07-28 (×4): qty 1

## 2017-07-28 MED ORDER — HEPARIN SODIUM (PORCINE) 1000 UNIT/ML DIALYSIS: 1000.0000 [IU] | INTRAMUSCULAR | Status: DC | PRN

## 2017-07-28 MED ORDER — CLOPIDOGREL BISULFATE 75 MG PO TABS
75.0000 mg | ORAL_TABLET | Freq: Once | ORAL | Status: AC
  Administered 2017-07-28: 75 mg via ORAL
  Filled 2017-07-28: qty 1

## 2017-07-28 MED ORDER — CALCIUM ACETATE 667 MG PO CAPS
1334.0000 mg | ORAL_CAPSULE | Freq: Two times a day (BID) | ORAL | Status: DC | PRN
  Filled 2017-07-28 (×2): qty 2

## 2017-07-28 MED ORDER — IPRATROPIUM-ALBUTEROL 0.5-2.5 (3) MG/3ML IN SOLN: 3.0000 mL | Freq: Four times a day (QID) | RESPIRATORY_TRACT | Status: DC | PRN

## 2017-07-28 MED ORDER — ONDANSETRON HCL 4 MG/2ML IJ SOLN: 4.0000 mg | Freq: Four times a day (QID) | INTRAMUSCULAR | Status: DC | PRN

## 2017-07-28 MED ORDER — TRAZODONE HCL 100 MG PO TABS
100.0000 mg | ORAL_TABLET | Freq: Every day | ORAL | Status: DC
  Administered 2017-07-28 – 2017-07-29 (×2): 100 mg via ORAL
  Filled 2017-07-28 (×2): qty 1

## 2017-07-28 MED ORDER — ALPRAZOLAM 0.5 MG PO TBDP: 0.5000 mg | ORAL_TABLET | Freq: Every day | ORAL | Status: DC | PRN

## 2017-07-28 MED ORDER — ACETAMINOPHEN 325 MG PO TABS
650.0000 mg | ORAL_TABLET | Freq: Four times a day (QID) | ORAL | Status: DC | PRN
  Administered 2017-07-29: 650 mg via ORAL
  Filled 2017-07-28: qty 2

## 2017-07-28 MED ORDER — TRAMADOL HCL 50 MG PO TABS: 25.0000 mg | ORAL_TABLET | Freq: Four times a day (QID) | ORAL | Status: DC | PRN

## 2017-07-28 MED ORDER — SODIUM CHLORIDE 0.9 % IV SOLN: 100.0000 mL | INTRAVENOUS | Status: DC | PRN

## 2017-07-28 MED ORDER — DIPHENHYDRAMINE HCL 25 MG PO TABS
25.0000 mg | ORAL_TABLET | Freq: Every day | ORAL | Status: DC | PRN
  Administered 2017-07-29: 25 mg via ORAL
  Filled 2017-07-28 (×2): qty 1

## 2017-07-28 MED ORDER — PENTAFLUOROPROP-TETRAFLUOROETH EX AERO
1.0000 "application " | INHALATION_SPRAY | CUTANEOUS | Status: DC | PRN
  Filled 2017-07-28: qty 30

## 2017-07-28 MED ORDER — NITROGLYCERIN 0.4 MG SL SUBL
0.4000 mg | SUBLINGUAL_TABLET | SUBLINGUAL | Status: DC | PRN
  Administered 2017-07-28 (×2): 0.4 mg via SUBLINGUAL

## 2017-07-28 MED ORDER — LIDOCAINE HCL (PF) 1 % IJ SOLN
5.0000 mL | INTRAMUSCULAR | Status: DC | PRN
  Filled 2017-07-28: qty 5

## 2017-07-28 MED ORDER — TRAMADOL HCL 50 MG PO TABS: 25.0000 mg | ORAL_TABLET | Freq: Two times a day (BID) | ORAL | Status: DC | PRN

## 2017-07-28 MED ORDER — ALPRAZOLAM 0.5 MG PO TABS: 0.5000 mg | ORAL_TABLET | Freq: Every day | ORAL | Status: DC | PRN

## 2017-07-28 MED ORDER — ACETAMINOPHEN 650 MG RE SUPP: 650.0000 mg | Freq: Four times a day (QID) | RECTAL | Status: DC | PRN

## 2017-07-28 MED ORDER — EPOETIN ALFA 10000 UNIT/ML IJ SOLN
4000.0000 [IU] | Freq: Once | INTRAMUSCULAR | Status: AC
  Administered 2017-07-28: 4000 [IU] via INTRAVENOUS

## 2017-07-28 MED ORDER — ADULT MULTIVITAMIN W/MINERALS CH
1.0000 | ORAL_TABLET | Freq: Every day | ORAL | Status: DC
  Administered 2017-07-28 – 2017-07-30 (×3): 1 via ORAL
  Filled 2017-07-28 (×3): qty 1

## 2017-07-28 NOTE — ED Notes (Signed)
Per Hospitalist note, to wean Bipap as tolerated.  First attempt to do so now; pt placed on 4L nasal cannula.  Will continue to monitor.

## 2017-07-28 NOTE — Progress Notes (Signed)
Pre HD assessment    07/28/17 1748  Neurological  Level of Consciousness Alert  Orientation Level Oriented X4  Respiratory  Respiratory Pattern Regular;Dyspnea with exertion;Dyspnea at rest  Chest Assessment Chest expansion symmetrical  Cough Non-productive  Cardiac  ECG Monitor Yes  Cardiac Rhythm ST  Vascular  R Radial Pulse +2  L Radial Pulse +2  Edema Right lower extremity;Left lower extremity;Generalized  Integumentary  Integumentary (WDL) X  Skin Color Appropriate for ethnicity  Musculoskeletal  Musculoskeletal (WDL) X  Generalized Weakness Yes  Assistive Device None  Gastrointestinal  Bowel Sounds Assessment Active  GU Assessment  Genitourinary (WDL) X  Genitourinary Symptoms  (HD)  Psychosocial  Psychosocial (WDL) WDL

## 2017-07-28 NOTE — Progress Notes (Signed)
72 yo female with hx of ESRD on HD admitted to stepdown unit 04/10 with acute on chronic respiratory failure secondary to pulmonary edema requiring Bipap after missing her dialysis session on 07/26/17.  Underwent hemodialysis on 04/10, and transitioned off Bipap to chronic home O2 @3L  O2 sats currently upper 90's. She is alert and oriented, nsr on cardiac monitor, faint crackles throughout, even respirations, and vss.  Pt states her shortness of breath has resolved.  Will transfer pt to medsurg unit once bed becomes available PCCM will sign off.  Sonda Rumble, AGNP  Pulmonary/Critical Care Pager [phone] (please enter 7 digits) PCCM Consult Pager [phone] (please enter 7 digits)

## 2017-07-28 NOTE — ED Notes (Signed)
Pt given meal tray.

## 2017-07-28 NOTE — Progress Notes (Signed)
Post HD assessment. Pt tolerated tx well without c/o or complications. Net UF 3027, goal met.    07/28/17 2147  Vital Signs  Temp 98.7 F (37.1 C)  Temp Source Oral  Pulse Rate 96  Pulse Rate Source Monitor  Resp 16  BP (!) 155/88  BP Location Right Wrist  BP Method Automatic  Patient Position (if appropriate) Lying  Oxygen Therapy  SpO2 98 %  O2 Device Nasal Cannula  O2 Flow Rate (L/min) 3 L/min  Dialysis Weight  Weight 107.1 kg (236 lb 1.8 oz)  Type of Weight Post-Dialysis  Post-Hemodialysis Assessment  Rinseback Volume (mL) 250 mL  KECN 78.8 V  Dialyzer Clearance Lightly streaked  Duration of HD Treatment -hour(s) 3.5 hour(s)  Hemodialysis Intake (mL) 500 mL  UF Total -Machine (mL) 3527 mL  Net UF (mL) 3027 mL  Tolerated HD Treatment Yes  AVG/AVF Arterial Site Held (minutes) 10 minutes  AVG/AVF Venous Site Held (minutes) 10 minutes  Education / Care Plan  Dialysis Education Provided Yes  Documented Education in Care Plan Yes  Fistula / Graft Left Forearm Arteriovenous fistula  No Placement Date or Time found.   Placed prior to admission: Yes  Orientation: Left  Access Location: Forearm  Access Type: Arteriovenous fistula  Site Condition No complications  Fistula / Graft Assessment Present;Thrill;Bruit  Status Deaccessed  Drainage Description None

## 2017-07-28 NOTE — Progress Notes (Signed)
[facility] [place] [facility]   PATIENT NAME: Ariel Smith    MR#:  [id number]  DATE OF BIRTH:  [birth year]  SUBJECTIVE:  CHIEF COMPLAINT: Patient is feeling better on BiPAP  REVIEW OF SYSTEMS:  CONSTITUTIONAL: No fever, fatigue or weakness.  EYES: No blurred or double vision.  EARS, NOSE, AND THROAT: No tinnitus or ear pain.  RESPIRATORY:  reports cough, shortness of breath, wheezing denies hemoptysis.  CARDIOVASCULAR: No chest pain, orthopnea, edema.  GASTROINTESTINAL: No nausea, vomiting, diarrhea or abdominal pain.  GENITOURINARY: No dysuria, hematuria.  ENDOCRINE: No polyuria, nocturia,  HEMATOLOGY: No anemia, easy bruising or bleeding SKIN: No rash or lesion. MUSCULOSKELETAL: No joint pain or arthritis.   NEUROLOGIC: No tingling, numbness, weakness.  PSYCHIATRY: No anxiety or depression.   DRUG ALLERGIES:   Allergies  Allergen Reactions  . Nodolor [Isometheptene-Dichloral-Apap] Other (See Comments)    Reaction:  Headaches     VITALS:  Blood pressure (!) 169/95, pulse 98, temperature 97.8 F (36.6 C), temperature source Oral, resp. rate (!) 22, height 5\' 4"  (1.626 m), weight 105.6 kg (232 lb 12.9 oz), SpO2 99 %.  PHYSICAL EXAMINATION:  GENERAL:  72 y.o.-year-old patient lying in the bed with no acute distress.  EYES: Pupils equal, round, reactive to light and accommodation. No scleral icterus. Extraocular muscles intact.  HEENT: Head atraumatic, normocephalic. Oropharynx and nasopharynx clear.  NECK:  Supple, no jugular venous distention. No thyroid enlargement, no tenderness.  LUNGS: MOD  breath sounds bilaterally, no wheezing, rales,rhonchi or crepitation. No use of accessory muscles of respiration.  CARDIOVASCULAR: S1, S2 normal. No murmurs, rubs, or gallops.  ABDOMEN: Soft, nontender, nondistended. Bowel sounds present. No organomegaly or mass.  EXTREMITIES: No pedal edema, cyanosis, or clubbing.  NEUROLOGIC: Cranial nerves II  through XII are intact. Muscle strength 5/5 in all extremities. Sensation intact. Gait not checked.  PSYCHIATRIC: The patient is alert and oriented x 3.  SKIN: No obvious rash, lesion, or ulcer.    LABORATORY PANEL:   CBC Recent Labs  Lab 07/28/17 0126  WBC 13.7*  HGB 9.5*  HCT 29.6*  PLT 99*   ------------------------------------------------------------------------------------------------------------------  Chemistries  Recent Labs  Lab 07/28/17 0126  NA 140  K 4.5  CL 103  CO2 23  GLUCOSE 122*  BUN 85*  CREATININE 11.05*  CALCIUM 8.0*  AST 19  ALT 13*  ALKPHOS 90  BILITOT 0.7   ------------------------------------------------------------------------------------------------------------------  Cardiac Enzymes Recent Labs  Lab 07/28/17 0126  TROPONINI 0.05*   ------------------------------------------------------------------------------------------------------------------  RADIOLOGY:  Dg Chest Port 1 View  Result Date: 07/28/2017 CLINICAL DATA:  Shortness of breath.  Missed dialysis 2 days ago. EXAM: PORTABLE CHEST 1 VIEW COMPARISON:  Radiograph 07/12/2017 FINDINGS: Unchanged heart size with prosthetic aortic valve. New bilateral perihilar opacities suspicious for pulmonary edema. No confluent airspace disease. No pneumothorax or large pleural effusion. Stable osseous structures. IMPRESSION: Moderate pulmonary edema which is new from prior exam. Electronically Signed   By: Rubye Oaks M.D.   On: 07/28/2017 02:12    EKG:   Orders placed or performed during the hospital encounter of 07/28/17  . ED EKG  . ED EKG  . EKG 12-Lead  . EKG 12-Lead    ASSESSMENT AND PLAN:   This is a 72 year old female admitted for respiratory failure. 1.  Respiratory failure: Acute; with hypoxia.   bIPAP  Wean positive pressure ventilation as tolerated. 2.  End-stage renal disease: On dialysis; consult nephrology for continuation of dialysis.  Also continue  PhosLo and  Sensipar 3.  COPD: Symptoms are not consistent with COPD exacerbation although the patient has received Solu-Medrol.  She will need to taper steroids as tolerated.  Continue inhaled corticosteroids 4.  Hypertension: Uncontrolled; labetalol as needed plan 5.  Hyperlipidemia: Continue statin therapy 6.  DVT prophylaxis: Eliquis 7.  GI prophylaxis: None       All the records are reviewed and case discussed with Care Management/Social Workerr. Management plans discussed with the patient, family and they are in agreement.  CODE STATUS: DNR  TOTAL TIME TAKING CARE OF THIS PATIENT: 36 minutes.   POSSIBLE D/C IN  DAYS, DEPENDING ON CLINICAL CONDITION.  Note: This dictation was prepared with Dragon dictation along with smaller phrase technology. Any transcriptional errors that result from this process are unintentional.   Ramonita Lab M.D on 07/28/2017 at 3:56 PM  Between 7am to 6pm - Pager - [phone] After 6pm go to [email or web address] - password [email or web address] [facility]  [facility]  Office  [phone]  CC: Primary care physician; [name], MD

## 2017-07-28 NOTE — Progress Notes (Signed)
HD tx start   07/28/17 1757  Vital Signs  Pulse Rate (!) 104  Pulse Rate Source Monitor  Resp (!) 22  BP (!) 164/99  BP Location Right Wrist  BP Method Automatic  Patient Position (if appropriate) Lying  Oxygen Therapy  SpO2 97 %  O2 Device Nasal Cannula  O2 Flow Rate (L/min) 3 L/min  During Hemodialysis Assessment  Blood Flow Rate (mL/min) 400 mL/min  Arterial Pressure (mmHg) -170 mmHg  Venous Pressure (mmHg) 180 mmHg  Transmembrane Pressure (mmHg) 70 mmHg  Ultrafiltration Rate (mL/min) 1000 mL/min  Dialysate Flow Rate (mL/min) 800 ml/min  Conductivity: Machine  14.9  HD Safety Checks Performed Yes  Dialysis Fluid Bolus Normal Saline  Bolus Amount (mL) 250 mL  Intra-Hemodialysis Comments Tx initiated  Fistula / Graft Left Forearm Arteriovenous fistula  No Placement Date or Time found.   Placed prior to admission: Yes  Orientation: Left  Access Location: Forearm  Access Type: Arteriovenous fistula  Status Accessed  Needle Size 15

## 2017-07-28 NOTE — Progress Notes (Signed)
Post HD assessment    07/28/17 2149  Neurological  Level of Consciousness Alert  Orientation Level Oriented X4  Respiratory  Respiratory Pattern Regular;Unlabored  Chest Assessment Chest expansion symmetrical  Cough Non-productive  Cardiac  ECG Monitor Yes  Vascular  R Radial Pulse +2  L Radial Pulse +2  Edema Right lower extremity;Left lower extremity;Generalized  Integumentary  Integumentary (WDL) X  Skin Color Appropriate for ethnicity  Musculoskeletal  Musculoskeletal (WDL) X  Generalized Weakness Yes  Assistive Device None  Gastrointestinal  Bowel Sounds Assessment Active  GU Assessment  Genitourinary (WDL) X  Genitourinary Symptoms  (HD)  Psychosocial  Psychosocial (WDL) WDL

## 2017-07-28 NOTE — ED Notes (Signed)
Pt provided snack upon request 

## 2017-07-28 NOTE — ED Notes (Signed)
Pt changed and given peri care, external catheter applied to pt

## 2017-07-28 NOTE — Progress Notes (Signed)
[facility], [place] 07/28/17  Subjective:   Patient's daughter reports that she woke up in the middle of the night with sudden onset of shortness of breath and tachypnea.  She tried to give her a breathing treatment which did not help therefore she brought her to the emergency room for evaluation.  Patient requires 3 L of oxygen at baseline.  Chest x-ray shows pulmonary edema Urgent hemodialysis assessment was requested   Objective:  Vital signs in last 24 hours:  Temp:  [97.8 F (36.6 C)-98.5 F (36.9 C)] 97.8 F (36.6 C) (04/10 1515) Pulse Rate:  [94-131] 100 (04/10 1515) Resp:  [11-24] 19 (04/10 1515) BP: (116-187)/(44-113) 127/65 (04/10 1515) SpO2:  [90 %-100 %] 99 % (04/10 1515) Weight:  [224 lb (101.6 kg)-232 lb 12.9 oz (105.6 kg)] 232 lb 12.9 oz (105.6 kg) (04/10 1515)  Weight change:  Filed Weights   07/28/17 0128 07/28/17 1515  Weight: 224 lb (101.6 kg) 232 lb 12.9 oz (105.6 kg)    Intake/Output:    Intake/Output Summary (Last 24 hours) at 07/28/2017 1611 Last data filed at 07/28/2017 1200 Gross per 24 hour  Intake -  Output 200 ml  Net -200 ml     Physical Exam: General:  No acute distress, laying in the bed  HEENT  anicteric, moist oral mucous membranes  Neck  supple  Pulm/lungs  mild basilar crackles, oxygen by nasal cannula  CVS/Heart  regular, tachycardic, prominent crescendo systolic murmur  Abdomen:   Soft, nontender  Extremities:  No significant edema  Neurologic:  Alert, oriented  Skin:  No acute rashes  Access:  AV fistula       Basic Metabolic Panel:  Recent Labs  Lab 07/28/17 0126  NA 140  K 4.5  CL 103  CO2 23  GLUCOSE 122*  BUN 85*  CREATININE 11.05*  CALCIUM 8.0*     CBC: Recent Labs  Lab 07/28/17 0126  WBC 13.7*  NEUTROABS 11.8*  HGB 9.5*  HCT 29.6*  MCV 105.8*  PLT 99*      Lab Results  Component Value Date   HEPBSAG Negative 10/25/2015      Microbiology:  No results found  for this or any previous visit (from the past 240 hour(s)).  Coagulation Studies: No results for input(s): LABPROT, INR in the last 72 hours.  Urinalysis: Recent Labs    07/28/17 1145  COLORURINE YELLOW*  LABSPEC 1.013  PHURINE 9.0*  GLUCOSEU 150*  HGBUR SMALL*  BILIRUBINUR NEGATIVE  KETONESUR NEGATIVE  PROTEINUR 30*  NITRITE NEGATIVE  LEUKOCYTESUR NEGATIVE      Imaging: Dg Chest Port 1 View  Result Date: 07/28/2017 CLINICAL DATA:  Shortness of breath.  Missed dialysis 2 days ago. EXAM: PORTABLE CHEST 1 VIEW COMPARISON:  Radiograph 07/12/2017 FINDINGS: Unchanged heart size with prosthetic aortic valve. New bilateral perihilar opacities suspicious for pulmonary edema. No confluent airspace disease. No pneumothorax or large pleural effusion. Stable osseous structures. IMPRESSION: Moderate pulmonary edema which is new from prior exam. Electronically Signed   By: Rubye Oaks M.D.   On: 07/28/2017 02:12     Medications:    . apixaban  2.5 mg Oral BID  . calcium acetate  1,334 mg Oral TID WC  . cinacalcet  30 mg Oral Q supper  . docusate sodium  100 mg Oral BID  . famotidine  40 mg Oral QPM  . multivitamin with minerals  1 tablet Oral Daily  . pantoprazole  40 mg Oral QHS  .  sertraline  50 mg Oral Daily  . [START ON 07/29/2017] torsemide  100 mg Oral Daily  . traZODone  100 mg Oral QHS   acetaminophen **OR** acetaminophen, ALPRAZolam, calcium acetate, diphenhydrAMINE, ipratropium-albuterol, nitroGLYCERIN, ondansetron **OR** ondansetron (ZOFRAN) IV, traMADol  Assessment/ Plan:  72 y.o. Caucasian female with chronic grade 2 diastolic congestive heart failure, COPD requiring oxygen supplementation by nasal cannula, end-stage renal disease, GERD, history of GI bleed, history of DVT in the leg, hypertension, history of transcatheter aortic valve replacement for severe aortic stenosis  [facility]/TTS/Dr. Kolluru/ LUE AVF  1.  End-stage renal disease 2.  Acute  pulmonary edema likely secondary to significant diastolic dysfunction 3.  Secondary hyperparathyroidism 4.  Anemia of chronic kidney disease  Plan: Urgent hemodialysis today with estimated volume removal of 3 L as tolerated Start torsemide on a daily basis Epogen with hemodialysis Continue home dose of phosphorus binders Chest x-ray tomorrow morning We will continue to follow    LOS: 0 Dannielle Baskins Thedore Mins 4/10/20194:11 PM  [facility] [place], [place] [phone]  Note: This note was prepared with Dragon dictation. Any transcription errors are unintentional

## 2017-07-28 NOTE — H&P (Addendum)
Ariel Smith is an 72 y.o. female.   Chief Complaint: Shortness of breath HPI: The patient with past medical history of chronic lung disease including COPD on chronic supplemental oxygen, diastolic heart failure, end-stage renal disease on dialysis, hypertension and hyperlipidemia presents to the emergency department complaining of shortness of breath.  The patient missed her dialysis today because she was feeling unwell.  She rapidly developed dyspnea and wheezing.  EMS administered Solu-Medrol in route.  Chest x-ray was more consistent with pulmonary edema versus infiltrate.  The patient was placed on BiPAP when she arrived to the emergency department and continued to have increased work of breathing and shortness of breath which prompted the emergency department staff to call the hospitalist service for admission.  Past Medical History:  Diagnosis Date  . Anemia of chronic disease   . Bronchiectasis ([place]) 08/2014    suggested by thoracic xray  . Chronic diastolic CHF (congestive heart failure) ([place])    a. echo 10/2014: EF 60-65%, no RWMA, GR1DD; b. 03/2015 Echo: EF 60-65%, no rwma, Gr2 DD.  Marland Kitchen Chronic respiratory failure (HCC)    a. 2/2 COPD; b. on 4-5L via nasal cannula  . COPD (chronic obstructive pulmonary disease) ([place])   . Depression   . ESRD (end stage renal disease) ([place]) 08/2011   a. on HD ([facility]), L forearm AV fistula, Dr. Candiss Norse  . Frequent headaches   . GERD (gastroesophageal reflux disease)   . GIB (gastrointestinal bleeding)    a. leading to cessation of warfarin 11/2014  . History of cardiac cath    a. 12/2014 Cath: nl cors. 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  Marland Kitchen History of colon polyps 2013   colonoscopy (Dr. Bary Castilla)  . History of DVT of lower extremity 2009, 2017   a. left sided x2, with PE s/p IVC filter placement, coumadin stopped 2/2 GI bleed in 2016; b. 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  Marland Kitchen HLD (hyperlipidemia)   . HTN (hypertension)   . Morbid obesity ([place])    . Osteoarthritis   . Osteopenia 01/2013  . Pulmonary embolism ([place]) 2009  . Secondary hyperparathyroidism of renal origin ([place])   . Severe aortic stenosis    a. echo 10/2014: mod to sev AS; b. 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 03/2015 Echo: EF 60-65%, Gr2 DD, m ild to mod AS (peak grad 23mg, mean 203m).    Past Surgical History:  Procedure Laterality Date  . A/V FISTULAGRAM Left 07/09/2016   Procedure: A/V Fistulagram;  Surgeon: JasAlgernon HuxleyD;  [facility];  Service: Cardiovascular;  Laterality: Left;  . BUNIONECTOMY Left 2003  . CARDIAC CATHETERIZATION N/A 01/18/2015   patent coronary arteries without significant osbtruction and preserved LV function, severe aortic stenosis; Procedure: Right/Left Heart Cath and Coronary Angiography;  Surgeon: MicSherren MochaD  . CHOLECYSTECTOMY  2012  . COLONOSCOPY  08/2011   colon biopsies, Dr. ByrFleet Contras ESOPHAGOGASTRODUODENOSCOPY  08/2011   gastric cardia polyp  . ESOPHAGOGASTRODUODENOSCOPY Left 11/11/2014   Procedure: ESOPHAGOGASTRODUODENOSCOPY (EGD);  Surgeon: PauHulen LusterD;  [facility];  Service: Endoscopy;  Laterality: Left;  . EXTERIORIZATION OF A CONTINUOUS AMBULATORY PERITONEAL DIALYSIS CATHETER  01/2013   removal 12/2103 - Dr. DewLucky Cowboy hospitalization  12/2013   recurrent R pleural effusion due to peritoneal fluid translocation s/p rpt thoracentesis with 1.3 L fluid removed, ERSD started on HD this hospitalization  . PERIPHERAL VASCULAR CATHETERIZATION N/A 11/12/2014  Procedure: IVC Filter Insertion;  Surgeon: Algernon Huxley, MD;  Location: [facility];  Service: Cardiovascular;  Laterality: N/A;  . PERIPHERAL VASCULAR CATHETERIZATION Left 10/24/2015   Procedure: Lower Extremity Venography with intervention (thormbolysis/thrombectomy);  Surgeon: Algernon Huxley, MD;  Location: [facility];  Service: Cardiovascular;  Laterality: Left;  .  REPLACEMENT TOTAL KNEE Left 2006  . SHOULDER ARTHROSCOPY Right 2009  . TEE WITHOUT CARDIOVERSION N/A 02/19/2015   Procedure: TRANSESOPHAGEAL ECHOCARDIOGRAM (TEE);  Surgeon: Sherren Mocha, MD;  Location: [facility] [place];  Service: Open Heart Surgery;  Laterality: N/A;  . TONSILLECTOMY  1955  . TRANSCATHETER AORTIC VALVE REPLACEMENT, TRANSFEMORAL Right 02/19/2015   Procedure: TRANSCATHETER AORTIC VALVE REPLACEMENT, TRANSFEMORAL;  Surgeon: Sherren Mocha, MD;  Location: [facility];  Service: Open Heart Surgery;  Laterality: Right;  . TUBAL LIGATION  1980  . US ECHOCARDIOGRAPHY  12/2013   EF 55-60%, nl LV sys fxn, mild-mod MR, AS, increased LV posterior wall thickness, mild TR    Family History  Problem Relation Age of Onset  . Deep vein thrombosis Brother   . Cancer Mother 19       colon  . Stroke Mother   . Cancer Father        prostate  . Hypertension Father   . Dementia Father 55  . Hypertension Brother   . Breast cancer Daughter 32  . Diabetes Neg Hx   . CAD Neg Hx    Social History:  reports that she quit smoking about 10 years ago. Her smoking use included cigarettes. She started smoking about 39 years ago. She has a 60.00 pack-year smoking history. She has never used smokeless tobacco. She reports that she does not drink alcohol or use drugs.  Allergies:  Allergies  Allergen Reactions  . Nodolor [Isometheptene-Dichloral-Apap] Other (See Comments)    Reaction:  Headaches     Prior to Admission medications   Medication Sig Start Date End Date Taking? Authorizing Provider  albuterol (VENTOLIN HFA) 108 (90 BASE) MCG/ACT inhaler Inhale 2 puffs into the lungs every 4 (four) hours as needed for wheezing or shortness of breath. 04/08/15  Yes Ria Bush, MD  ALPRAZolam (NIRAVAM) 0.5 MG dissolvable tablet TAKE ONE-HALF TO ONE TABLET BY MOUTH ONCE DAILY AS NEEDED FOR ANXIETY 07/07/17  Yes Ria Bush, MD  calcium acetate (PHOSLO) 667 MG capsule Take 1,334 mg by mouth 3 (three) times daily  with meals.    Yes [provider]  calcium acetate (PHOSLO) 667 MG capsule Take 1,334 mg by mouth as needed (with snacks).   Yes [provider]  cinacalcet (SENSIPAR) 30 MG tablet Take 30 mg by mouth daily.   Yes [provider]  clopidogrel (PLAVIX) 75 MG tablet TAKE 1 TABLET BY MOUTH ONCE DAILY WITH BREAKFAST 07/19/17  Yes Gollan, Kathlene November, MD  diphenhydrAMINE (BENADRYL) 25 MG tablet Take 25 mg by mouth every 6 (six) hours as needed for allergies.    Yes [provider]  ELIQUIS 2.5 MG TABS tablet TAKE 1 TABLET BY MOUTH TWICE DAILY 05/28/17  Yes Ria Bush, MD  famotidine (PEPCID) 40 MG tablet TAKE 1 TABLET BY MOUTH IN THE EVENING 05/04/17  Yes Wilhelmina Mcardle, MD  ipratropium-albuterol (DUONEB) 0.5-2.5 (3) MG/3ML SOLN Take 3 mLs by nebulization every 6 (six) hours as needed (shortness of breath/ wheezing). 07/14/17  Yes Pyreddy, Reatha Harps, MD  lidocaine-prilocaine (EMLA) cream Apply 1 application topically as needed (topical anesthesia for hemodialysis if Gebauers and Lidocaine injection are ineffective.).  11/22/14  Yes Dustin Flock, MD  Multiple Vitamin (MULTIVITAMIN WITH MINERALS) TABS tablet Take 1 tablet by mouth daily.   Yes [provider]  sertraline (ZOLOFT) 50 MG tablet Take 1 tablet (50 mg total) by mouth daily. 06/15/17  Yes Ria Bush, MD  traMADol (ULTRAM) 50 MG tablet Take 0.5-1 tablets (25-50 mg total) by mouth 2 (two) times daily as needed. Patient taking differently: Take 25-50 mg by mouth 2 (two) times daily as needed for moderate pain.  06/15/17  Yes Ria Bush, MD  traZODone (DESYREL) 50 MG tablet Take 100 mg by mouth at bedtime.   Yes [provider]  atorvastatin (LIPITOR) 40 MG tablet TAKE 1 TABLET BY MOUTH ONCE DAILY AT  6PM. NEEDS OFFICE VISIT. Patient not taking: Reported on 07/28/2017 06/25/17   Ria Bush, MD  predniSONE (DELTASONE) 10 MG tablet Take 1 tablet (10 mg total) by mouth daily. Label  &  dispense according to the schedule below.  6 tablets day one, then 5 table day 2, then 4 tablets day 3, then 3 tablets day 4, 2 tablets day 5, then 1 tablet day 6, then stop Patient not taking: Reported on 07/28/2017 07/14/17   Epifanio Lesches, MD  traZODone (DESYREL) 50 MG tablet TAKE 1 & 1/2 (ONE & ONE-HALF) TABLETS BY MOUTH AT BEDTIME Patient not taking: Reported on 07/28/2017 05/17/17   Ria Bush, MD     Results for orders placed or performed during the hospital encounter of 07/28/17 (from the past 48 hour(s))  Comprehensive metabolic panel     Status: Abnormal   Collection Time: 07/28/17  1:26 AM  Result Value Ref Range   Sodium 140 135 - 145 mmol/L   Potassium 4.5 3.5 - 5.1 mmol/L   Chloride 103 101 - 111 mmol/L   CO2 23 22 - 32 mmol/L   Glucose, Bld 122 (H) 65 - 99 mg/dL   BUN 85 (H) 6 - 20 mg/dL   Creatinine, Ser 11.05 (H) 0.44 - 1.00 mg/dL   Calcium 8.0 (L) 8.9 - 10.3 mg/dL   Total Protein 6.2 (L) 6.5 - 8.1 g/dL   Albumin 3.3 (L) 3.5 - 5.0 g/dL   AST 19 15 - 41 U/L   ALT 13 (L) 14 - 54 U/L   Alkaline Phosphatase 90 38 - 126 U/L   Total Bilirubin 0.7 0.3 - 1.2 mg/dL   GFR calc non Af Amer 3 (L) >60 mL/min   GFR calc Af Amer 4 (L) >60 mL/min    Comment: (NOTE) The eGFR has been calculated using the CKD EPI equation. This calculation has not been validated in all clinical situations. eGFR's persistently <60 mL/min signify possible Chronic Kidney Disease.    Anion gap 14 5 - 15    Comment: Performed at [facility], [address] [place], [place] [address]  Brain natriuretic peptide     Status: Abnormal   Collection Time: 07/28/17  1:26 AM  Result Value Ref Range   B Natriuretic Peptide 704.0 (H) 0.0 - 100.0 pg/mL    Comment: Performed at [facility], [address] [place], [place] [address]  Troponin I     Status: Abnormal   Collection Time: 07/28/17  1:26 AM  Result Value Ref Range   Troponin I 0.05 (HH) <0.03 ng/mL    Comment:  CRITICAL RESULT CALLED TO, READ BACK BY AND VERIFIED WITH KASEY ROBERTS AT 0217 ON 07/28/17 RWW Performed at [facility], [address] [place], [place]  [address]   CBC with Differential     Status: Abnormal   Collection Time: 07/28/17  1:26 AM  Result Value Ref Range   WBC 13.7 (H) 3.6 - 11.0 K/uL   RBC 2.80 (L) 3.80 - 5.20 MIL/uL   Hemoglobin 9.5 (L) 12.0 - 16.0 g/dL   HCT 29.6 (L) 35.0 - 47.0 %   MCV 105.8 (H) 80.0 - 100.0 fL   MCH 33.9 26.0 - 34.0 pg   MCHC 32.1 32.0 - 36.0 g/dL   RDW 14.6 (H) 11.5 - 14.5 %   Platelets 99 (L) 150 - 440 K/uL   Neutrophils Relative % 85 %   Neutro Abs 11.8 (H) 1.4 - 6.5 K/uL   Lymphocytes Relative 6 %   Lymphs Abs 0.9 (L) 1.0 - 3.6 K/uL   Monocytes Relative 7 %   Monocytes Absolute 0.9 0.2 - 0.9 K/uL   Eosinophils Relative 1 %   Eosinophils Absolute 0.1 0 - 0.7 K/uL   Basophils Relative 1 %   Basophils Absolute 0.1 0 - 0.1 K/uL    Comment: Performed at [facility], [address] [place], [place] [address]  Blood gas, venous     Status: Abnormal (Preliminary result)   Collection Time: 07/28/17  1:26 AM  Result Value Ref Range   FIO2 28.00    pH, Ven 7.40 7.250 - 7.430   pCO2, Ven 39 (L) 44.0 - 60.0 mmHg   pO2, Ven PENDING 32.0 - 45.0 mmHg   Bicarbonate 24.2 20.0 - 28.0 mmol/L   Acid-base deficit 0.5 0.0 - 2.0 mmol/L   O2 Saturation PENDING %   Patient temperature 37.0    Collection site VENOUS    Sample type VENOUS     Comment: Performed at [facility], [address], [place], [place] [address]   Dg Chest Port 1 View  Result Date: 07/28/2017 CLINICAL DATA:  Shortness of breath.  Missed dialysis 2 days ago. EXAM: PORTABLE CHEST 1 VIEW COMPARISON:  Radiograph 07/12/2017 FINDINGS: Unchanged heart size with prosthetic aortic valve. New bilateral perihilar opacities suspicious for pulmonary edema. No confluent airspace disease. No pneumothorax or large pleural effusion. Stable osseous structures.  IMPRESSION: Moderate pulmonary edema which is new from prior exam. Electronically Signed   By: Jeb Levering M.D.   On: 07/28/2017 02:12    Review of Systems  Constitutional: Negative for chills and fever.  HENT: Negative for sore throat and tinnitus.   Eyes: Negative for blurred vision and redness.  Respiratory: Positive for shortness of breath. Negative for cough.   Cardiovascular: Negative for chest pain, palpitations, orthopnea and PND.  Gastrointestinal: Negative for abdominal pain, diarrhea, nausea and vomiting.  Genitourinary: Negative for dysuria, frequency and urgency.  Musculoskeletal: Negative for joint pain and myalgias.  Skin: Negative for rash.       No lesions  Neurological: Negative for speech change, focal weakness and weakness.  Endo/Heme/Allergies: Does not bruise/bleed easily.       No temperature intolerance  Psychiatric/Behavioral: Negative for depression and suicidal ideas.    Blood pressure (!) 169/103, pulse 100, temperature 98.5 F (36.9 C), temperature source Axillary, resp. rate (!) 22, height 5' 4" (1.626 m), weight 101.6 kg (224 lb), SpO2 99 %. Physical Exam  Vitals reviewed. Constitutional: She is oriented to person, place, and time. She appears well-developed and well-nourished.  HENT:  Head: Normocephalic and atraumatic.  Mouth/Throat: Oropharynx is clear and moist.  Eyes: Pupils are equal, round, and reactive to light. Conjunctivae and  EOM are normal. No scleral icterus.  Neck: Normal range of motion. Neck supple. No JVD present. No tracheal deviation present. No thyromegaly present.  Cardiovascular: Normal rate, regular rhythm and normal heart sounds. Exam reveals no gallop and no friction rub.  No murmur heard. Respiratory: Effort normal and breath sounds normal.  GI: Soft. Bowel sounds are normal. She exhibits no distension. There is no tenderness.  Genitourinary:  Genitourinary Comments: Deferred  Musculoskeletal: Normal range of motion.  She exhibits no edema.  Lymphadenopathy:    She has no cervical adenopathy.  Neurological: She is alert and oriented to person, place, and time. No cranial nerve deficit. She exhibits normal muscle tone.  Skin: Skin is warm and dry. No rash noted. No erythema.  Psychiatric: She has a normal mood and affect. Her behavior is normal. Judgment and thought content normal.     Assessment/Plan This is a 72 year old female admitted for respiratory failure. 1.  Respiratory failure: Acute; with hypoxia.  Air movement has improved but patient has significant work of breathing.  Wean positive pressure ventilation as tolerated. 2.  End-stage renal disease: On dialysis; insult nephrology for continuation of dialysis.  Also continue PhosLo and Sensipar 3.  COPD: Symptoms are not consistent with COPD exacerbation although the patient has received Solu-Medrol.  She will need to taper steroids as tolerated.  Continue inhaled corticosteroids 4.  Hypertension: Uncontrolled; labetalol as needed plan 5.  Hyperlipidemia: Continue statin therapy 6.  DVT prophylaxis: Eliquis 7.  GI prophylaxis: None The patient is a DO NOT RESUSCITATE.  Time spent on admission was a critical care approximately 45 minutes. Discussed with [facility] telemedicine  Harrie Foreman, MD 07/28/2017, 7:46 AM

## 2017-07-28 NOTE — Progress Notes (Signed)
HD tx end   07/28/17 2136  Vital Signs  Pulse Rate 97  Pulse Rate Source Monitor  Resp 18  BP (!) 153/88  BP Location Right Wrist  BP Method Automatic  Patient Position (if appropriate) Lying  Oxygen Therapy  SpO2 97 %  O2 Device Nasal Cannula  O2 Flow Rate (L/min) 3 L/min  During Hemodialysis Assessment  Dialysis Fluid Bolus Normal Saline  Bolus Amount (mL) 250 mL  Intra-Hemodialysis Comments Tx completed

## 2017-07-28 NOTE — ED Notes (Signed)
A1C lab tube sent to lab

## 2017-07-28 NOTE — Progress Notes (Signed)
Pre HD assessment   07/28/17 1747  Vital Signs  Temp 98.6 F (37 C)  Temp Source Oral  Pulse Rate (!) 105  Pulse Rate Source Monitor  Resp (!) 24  BP (!) 159/100  BP Location Right Wrist  BP Method Automatic  Patient Position (if appropriate) Lying  Oxygen Therapy  SpO2 98 %  O2 Device Nasal Cannula  O2 Flow Rate (L/min) 3 L/min  Pain Assessment  Pain Scale 0-10  Pain Score 0  Dialysis Weight  Weight 110 kg (242 lb 8.1 oz)  Type of Weight Pre-Dialysis  Time-Out for Hemodialysis  What Procedure? HD  Pt Identifiers(min of two) First/Last Name;MRN/Account#  Correct Site? Yes  Correct Side? Yes  Correct Procedure? Yes  Consents Verified? Yes  Rad Studies Available? N/A  Safety Precautions Reviewed? Yes  Biochemist, clinical Number  (5A)  Station Number 3  UF/Alarm Test Passed  Conductivity: Meter 13.8  Conductivity: Machine  14.1  pH 7.6  Reverse Osmosis main  Normal Saline Lot Number [id number]  Dialyzer Lot Number [id number]  Disposable Set Lot Number [id number]  Machine Temperature 98.6 F (37 C)  Immunologist and Audible Yes  Blood Lines Intact and Secured Yes  Pre Treatment Patient Checks  Vascular access used during treatment Fistula  Hepatitis B Surface Antigen Results Negative  Date Hepatitis B Surface Antigen Drawn 09/04/16  Hepatitis B Surface Antibody  (>10)  Date Hepatitis B Surface Antibody Drawn 05/17/17  Hemodialysis Consent Verified Yes  Hemodialysis Standing Orders Initiated Yes  ECG (Telemetry) Monitor On Yes  Prime Ordered Normal Saline  Length of  DialysisTreatment -hour(s) 3.5 Hour(s)  Dialyzer Elisio 17H NR  Dialysate 3K, 2.5 Ca  Dialysis Anticoagulant None  Dialysate Flow Ordered 800  Blood Flow Rate Ordered 400 mL/min  Ultrafiltration Goal 3 Liters  Pre Treatment Labs Phosphorus  Dialysis Blood Pressure Support Ordered Normal Saline  Education / Care Plan  Dialysis Education Provided Yes  Documented Education in Care Plan Yes   Fistula / Graft Left Forearm Arteriovenous fistula  No Placement Date or Time found.   Placed prior to admission: Yes  Orientation: Left  Access Location: Forearm  Access Type: Arteriovenous fistula  Site Condition No complications  Fistula / Graft Assessment Present;Thrill;Bruit  Drainage Description None

## 2017-07-28 NOTE — ED Provider Notes (Signed)
[facility] Emergency Department Provider Note  ____________________________________________   First MD Initiated Contact with Patient 07/28/17 0121     (approximate)  I have reviewed the triage vital signs and the nursing notes.   HISTORY  Chief Complaint Shortness of Breath  Level 5 exemption history limited by the patient's clinical condition  HPI Ariel Smith is a 72 y.o. female who comes to the emergency department via EMS with acute shortness of breath.  She has a past medical history of end-stage renal disease and receives dialysis via fistula in her left upper extremity.  Her last dialysis was Friday she missed it this Monday because she felt sick.  She also has a past medical history of COPD.  She took several nebulizations at home without improvement.  EMS gave her 125 mg of Solu-Medrol and one breathing treatment in route with no improvement.  The patient is baseline on 3 L of oxygen.  Past Medical History:  Diagnosis Date  . Anemia of chronic disease   . Bronchiectasis (HCC) 08/2014    suggested by thoracic xray  . Chronic diastolic CHF (congestive heart failure) (HCC)    a. echo 10/2014: EF 60-65%, no RWMA, GR1DD; b. 03/2015 Echo: EF 60-65%, no rwma, Gr2 DD.  Marland Kitchen Chronic respiratory failure (HCC)    a. 2/2 COPD; b. on 4-5L via nasal cannula  . COPD (chronic obstructive pulmonary disease) (HCC)   . Depression   . ESRD (end stage renal disease) (HCC) 08/2011   a. on HD (TThSa), L forearm AV fistula, [name]  . Frequent headaches   . GERD (gastroesophageal reflux disease)   . GIB (gastrointestinal bleeding)    a. leading to cessation of warfarin 11/2014  . History of cardiac cath    a. 12/2014 Cath: nl cors. 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  Marland Kitchen History of colon polyps 2013   colonoscopy ([name])  . History of DVT of lower extremity 2009, 2017   a. left sided x2, with PE s/p IVC filter placement, coumadin stopped 2/2 GI bleed in 2016; b.  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  Marland Kitchen HLD (hyperlipidemia)   . HTN (hypertension)   . Morbid obesity (HCC)   . Osteoarthritis   . Osteopenia 01/2013  . Pulmonary embolism (HCC) 2009  . Secondary hyperparathyroidism of renal origin (HCC)   . Severe aortic stenosis    a. echo 10/2014: mod to sev AS; b. 02/2015 TAVR (Owen/Cooper): Edwards Sapien 3 THV (size 23mm, model #[id number], ser# [id number]); c. 03/2015 Echo: EF 60-65%, Gr2 DD, m ild to mod AS (peak grad , mean ).    Patient Active Problem List   Diagnosis Date Noted  . Acute bronchitis 07/20/2017  . Anxiety 07/20/2017  . Epidermal cyst of neck 06/23/2017  . Epigastric discomfort 06/15/2017  . Lumbar back pain 06/15/2017  . History of ischemic right MCA stroke 05/24/2017  . 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 Bilateral hearing loss 03/25/2017  . Breast mass, left 03/21/2017  . Bradycardia 03/21/2017  . Altered mental status 03/04/2017  . Mass of chest wall, right 04/09/2016  . 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 Insomnia 04/08/2015  . 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 Chronic respiratory failure (HCC)   . Anemia of chronic disease   . Chronic diastolic CHF (congestive heart failure) (HCC)   . Obesity, Class II, BMI 35-39.9, with comorbidity 01/07/2015  . COPD (chronic obstructive pulmonary  disease) (HCC) 12/20/2014  . DVT (deep venous thrombosis) (HCC) 11/16/2014  . Aortic valvar stenosis 11/16/2014  . Vitamin D deficiency 11/03/2014  . Memory deficit 11/01/2014  . Right-sided thoracic back pain 08/17/2014  . Advanced care planning/counseling discussion 03/30/2014  . Health maintenance examination 03/30/2014  . Vitamin B12 deficiency 01/27/2014  . Medicare annual wellness visit, subsequent 01/27/2013  . Osteopenia 01/18/2013  . Chronic cough 10/22/2012  . Osteoarthritis   . MDD (major depressive disorder), recurrent episode, moderate  (HCC)   . HTN (hypertension)   . HLD (hyperlipidemia)   . GERD (gastroesophageal reflux disease)   . End stage renal disease (HCC) 08/19/2011    Past Surgical History:  Procedure Laterality Date  . A/V FISTULAGRAM Left 07/09/2016   Procedure: A/V Fistulagram;  Surgeon: [name], MD;  Location: [facility] INVASIVE CV LAB;  Service: Cardiovascular;  Laterality: Left;  . BUNIONECTOMY Left 2003  . CARDIAC CATHETERIZATION N/A 01/18/2015   patent coronary arteries without significant osbtruction and preserved LV function, severe aortic stenosis; Procedure: Right/Left Heart Cath and Coronary Angiography;  Surgeon: Tonny Bollman, MD  . CHOLECYSTECTOMY  2012  . COLONOSCOPY  08/2011   colon biopsies, [name]  . ESOPHAGOGASTRODUODENOSCOPY  08/2011   gastric cardia polyp  . ESOPHAGOGASTRODUODENOSCOPY Left 11/11/2014   Procedure: ESOPHAGOGASTRODUODENOSCOPY (EGD);  Surgeon: [name], MD;  Location: [facility] ENDOSCOPY;  Service: Endoscopy;  Laterality: Left;  . EXTERIORIZATION OF A CONTINUOUS AMBULATORY PERITONEAL DIALYSIS CATHETER  01/2013   removal 12/2103 - [name]  . hospitalization  12/2013   recurrent R pleural effusion due to peritoneal fluid translocation s/p rpt thoracentesis with 1.3 L fluid removed, ERSD started on HD this hospitalization  . PERIPHERAL VASCULAR CATHETERIZATION N/A 11/12/2014   Procedure: IVC Filter Insertion;  Surgeon: [name], MD;  Location: [facility] INVASIVE CV LAB;  Service: Cardiovascular;  Laterality: N/A;  . PERIPHERAL VASCULAR CATHETERIZATION Left 10/24/2015   Procedure: Lower Extremity Venography with intervention (thormbolysis/thrombectomy);  Surgeon: [name], MD;  Location: [facility] INVASIVE CV LAB;  Service: Cardiovascular;  Laterality: Left;  . REPLACEMENT TOTAL KNEE Left 2006  . SHOULDER ARTHROSCOPY Right 2009  . TEE WITHOUT CARDIOVERSION N/A 02/19/2015   Procedure: TRANSESOPHAGEAL ECHOCARDIOGRAM (TEE);  Surgeon: Tonny Bollman, MD;  Location: [facility] OR;  Service: Open  Heart Surgery;  Laterality: N/A;  . TONSILLECTOMY  1955  . TRANSCATHETER AORTIC VALVE REPLACEMENT, TRANSFEMORAL Right 02/19/2015   Procedure: TRANSCATHETER AORTIC VALVE REPLACEMENT, TRANSFEMORAL;  Surgeon: Tonny Bollman, MD;  Location: [facility] OR;  Service: Open Heart Surgery;  Laterality: Right;  . TUBAL LIGATION  1980  . US ECHOCARDIOGRAPHY  12/2013   EF 55-60%, nl LV sys fxn, mild-mod MR, AS, increased LV posterior wall thickness, mild TR    Prior to Admission medications   Medication Sig Start Date End Date Taking? Authorizing Provider  albuterol (VENTOLIN HFA) 108 (90 BASE) MCG/ACT inhaler Inhale 2 puffs into the lungs every 4 (four) hours as needed for wheezing or shortness of breath. 04/08/15  Yes [name], MD  ALPRAZolam (NIRAVAM) 0.5 MG dissolvable tablet TAKE ONE-HALF TO ONE TABLET BY MOUTH ONCE DAILY AS NEEDED FOR ANXIETY 07/07/17  Yes [name], MD  calcium acetate (PHOSLO) 667 MG capsule Take 1,334 mg by mouth 3 (three) times daily with meals.    Yes [provider]  calcium acetate (PHOSLO) 667 MG capsule Take 1,334 mg by mouth as needed (with snacks).   Yes [provider]  cinacalcet (SENSIPAR) 30 MG tablet Take 30  mg by mouth daily.   Yes [provider]  clopidogrel (PLAVIX) 75 MG tablet TAKE 1 TABLET BY MOUTH ONCE DAILY WITH BREAKFAST 07/19/17  Yes Gollan, Tollie Pizza, MD  diphenhydrAMINE (BENADRYL) 25 MG tablet Take 25 mg by mouth every 6 (six) hours as needed for allergies.    Yes [provider]  ELIQUIS 2.5 MG TABS tablet TAKE 1 TABLET BY MOUTH TWICE DAILY 05/28/17  Yes [name], MD  famotidine (PEPCID) 40 MG tablet TAKE 1 TABLET BY MOUTH IN THE EVENING 05/04/17  Yes Merwyn Katos, MD  ipratropium-albuterol (DUONEB) 0.5-2.5 (3) MG/3ML SOLN Take 3 mLs by nebulization every 6 (six) hours as needed (shortness of breath/ wheezing). 07/14/17  Yes Pyreddy, Vivien Rota, MD  lidocaine-prilocaine (EMLA) cream Apply 1 application topically  as needed (topical anesthesia for hemodialysis if Gebauers and Lidocaine injection are ineffective.). 11/22/14  Yes Auburn Bilberry, MD  Multiple Vitamin (MULTIVITAMIN WITH MINERALS) TABS tablet Take 1 tablet by mouth daily.   Yes [provider]  sertraline (ZOLOFT) 50 MG tablet Take 1 tablet (50 mg total) by mouth daily. 06/15/17  Yes [name], MD  traMADol (ULTRAM) 50 MG tablet Take 0.5-1 tablets (25-50 mg total) by mouth 2 (two) times daily as needed. Patient taking differently: Take 25-50 mg by mouth 2 (two) times daily as needed for moderate pain.  06/15/17  Yes [name], MD  traZODone (DESYREL) 50 MG tablet Take 100 mg by mouth at bedtime.   Yes [provider]  atorvastatin (LIPITOR) 40 MG tablet TAKE 1 TABLET BY MOUTH ONCE DAILY AT  6PM. NEEDS OFFICE VISIT. Patient not taking: Reported on 07/28/2017 06/25/17   [name], MD  predniSONE (DELTASONE) 10 MG tablet Take 1 tablet (10 mg total) by mouth daily. Label  & dispense according to the schedule below.  6 tablets day one, then 5 table day 2, then 4 tablets day 3, then 3 tablets day 4, 2 tablets day 5, then 1 tablet day 6, then stop Patient not taking: Reported on 07/28/2017 07/14/17   Katha Hamming, MD  traZODone (DESYREL) 50 MG tablet TAKE 1 & 1/2 (ONE & ONE-HALF) TABLETS BY MOUTH AT BEDTIME Patient not taking: Reported on 07/28/2017 05/17/17   [name], MD    Allergies Nodolor [isometheptene-dichloral-apap]  Family History  Problem Relation Age of Onset  . Deep vein thrombosis Brother   . Cancer Mother 95       colon  . Stroke Mother   . Cancer Father        prostate  . Hypertension Father   . Dementia Father 67  . Hypertension Brother   . Breast cancer Daughter 57  . Diabetes Neg Hx   . CAD Neg Hx     Social History Social History   Tobacco Use  . Smoking status: Former Smoker    Packs/day: 2.00    Years: 30.00    Pack years: 60.00    Types: Cigarettes    Start  date: 04/20/1978    Last attempt to quit: 04/21/2007    Years since quitting: 10.2  . Smokeless tobacco: Never Used  Substance Use Topics  . Alcohol use: No  . Drug use: No    Review of Systems Level 5 exemption history limited by the patient's clinical condition  ____________________________________________   PHYSICAL EXAM:  VITAL SIGNS: ED Triage Vitals  Enc Vitals Group     BP      Pulse      Resp  Temp      Temp src      SpO2      Weight      Height      Head Circumference      Peak Flow      Pain Score      Pain Loc      Pain Edu?      Excl. in GC?     Constitutional: In moderate to severe respiratory distress speaking in 1-2 word gasps and appears critically ill Eyes: PERRL EOMI. Head: Atraumatic. Nose: No congestion/rhinnorhea. Mouth/Throat: No trismus Neck: No stridor.   Cardiovascular: Tachycardic rate, regular rhythm. Grossly normal heart sounds.  Good peripheral circulation. Respiratory: Increased respiratory effort using accessory muscles with crackles at bilateral bases Gastrointestinal: Soft nontender Musculoskeletal: Mild bilateral lower extremity edema with legs equal in size Neurologic:  . No gross focal neurologic deficits are appreciated. Skin:  Skin is warm, dry and intact. No rash noted. Psychiatric: Anxious appearing  ____________________________________________   DIFFERENTIAL includes but not limited to  Pneumothorax, pulmonary edema, COPD exacerbation, pulmonary embolism ____________________________________________   LABS (all labs ordered are listed, but only abnormal results are displayed)  Labs Reviewed  COMPREHENSIVE METABOLIC PANEL - Abnormal; Notable for the following components:      Result Value   Glucose, Bld 122 (*)    BUN 85 (*)    Creatinine, Ser 11.05 (*)    Calcium 8.0 (*)    Total Protein 6.2 (*)    Albumin 3.3 (*)    ALT 13 (*)    GFR calc non Af Amer 3 (*)    GFR calc Af Amer 4 (*)    All other components  within normal limits  BRAIN NATRIURETIC PEPTIDE - Abnormal; Notable for the following components:   B Natriuretic Peptide 704.0 (*)    All other components within normal limits  TROPONIN I - Abnormal; Notable for the following components:   Troponin I 0.05 (*)    All other components within normal limits  CBC WITH DIFFERENTIAL/PLATELET - Abnormal; Notable for the following components:   WBC 13.7 (*)    RBC 2.80 (*)    Hemoglobin 9.5 (*)    HCT 29.6 (*)    MCV 105.8 (*)    RDW 14.6 (*)    Platelets 99 (*)    Neutro Abs 11.8 (*)    Lymphs Abs 0.9 (*)    All other components within normal limits  BLOOD GAS, VENOUS - Abnormal; Notable for the following components:   pCO2, Ven 39 (*)    All other components within normal limits  URINALYSIS, COMPLETE (UACMP) WITH MICROSCOPIC    Lab work reviewed by me with no indication for emergent dialysis __________________________________________  EKG  ED ECG REPORT I, Merrily Brittle, the attending physician, personally viewed and interpreted this ECG.  Date: 07/28/2017 EKG Time:  Rate: 130 Rhythm: Sinus tachycardia QRS Axis: normal Intervals: normal ST/T Wave abnormalities: normal Narrative Interpretation: no evidence of acute ischemia  ____________________________________________  RADIOLOGY  Chest x-ray reviewed by me with acute pulmonary edema ____________________________________________   PROCEDURES  Procedure(s) performed: no  .Critical Care Performed by: Merrily Brittle, MD Authorized by: Merrily Brittle, MD   Critical care provider statement:    Critical care time (minutes):  35   Critical care time was exclusive of:  Separately billable procedures and treating other patients   Critical care was necessary to treat or prevent imminent or life-threatening deterioration of the following conditions:  Respiratory  failure   Critical care was time spent personally by me on the following activities:  Development of treatment  plan with patient or surrogate, discussions with consultants, evaluation of patient's response to treatment, examination of patient, obtaining history from patient or surrogate, ordering and performing treatments and interventions, ordering and review of laboratory studies, ordering and review of radiographic studies, pulse oximetry, re-evaluation of patient's condition and review of old charts    Critical Care performed: Yes  Observation: no ____________________________________________   INITIAL IMPRESSION / ASSESSMENT AND PLAN / ED COURSE  Pertinent labs & imaging results that were available during my care of the patient were reviewed by me and considered in my medical decision making (see chart for details).  The patient arrives critically ill-appearing with elevated respiratory rate, hypoxic, in moderate to severe respiratory distress.  She has crackles at bilateral bases and she is extremely hypertensive.  She is unable to lie completely flat and her symptoms are more consistent with acute pulmonary edema than COPD exacerbation.  2 tablets of sublingual nitroglycerin given with improvement in her symptoms and she was then transitioned onto BiPAP with immediate improvement in her symptoms.     Fortunately the patient's potassium does not require emergent dialysis.  Chest x-ray does show significant pulmonary edema.  At this point she requires inpatient admission for urgent dialysis for her acute pulmonary edema.  I discussed with the patient and family who verbalized understanding and agreement the plan.  I then discussed with the hospitalist Dr. Sheryle Hail who has graciously agreed to admit the patient to his service. ____________________________________________   FINAL CLINICAL IMPRESSION(S) / ED DIAGNOSES  Final diagnoses:  Acute respiratory failure with hypoxia (HCC)  Acute pulmonary edema (HCC)      NEW MEDICATIONS STARTED DURING THIS VISIT:  New Prescriptions   No  medications on file     Note:  This document was prepared using Dragon voice recognition software and may include unintentional dictation errors.     Merrily Brittle, MD 07/28/17 [phone]

## 2017-07-28 NOTE — ED Notes (Signed)
Respiratory in rm to place bipap on pt

## 2017-07-28 NOTE — ED Triage Notes (Signed)
Pt from home, BIB EMS with reports of [facility] that started yest. Pt is a dialysis pt and missed dialysis on Monday, pt is scheduled to get dialysis today. Pt given 1 duoneb and solumedrol by EMS. Pt on Eliquis. Pt A&O at this time.

## 2017-07-28 NOTE — ED Notes (Signed)
Pt and daughter at bedside sleeping at this time, equal and unlabored resp noted with pt, bipap on at this time.

## 2017-07-29 ENCOUNTER — Inpatient Hospital Stay (HOSPITAL_COMMUNITY)
Admit: 2017-07-29 | Discharge: 2017-07-29 | Disposition: A | Discharge status: Home / Self Care | Payer: Medicare Other | Attending: Nephrology | Admitting: Nephrology

## 2017-07-29 ENCOUNTER — Inpatient Hospital Stay: Payer: Medicare Other

## 2017-07-29 DIAGNOSIS — I340 Nonrheumatic mitral (valve) insufficiency: Secondary | ICD-10-CM

## 2017-07-29 LAB — ECHOCARDIOGRAM COMPLETE
HEIGHTINCHES: 64 in
Weight: 3679.04 oz

## 2017-07-29 LAB — PHOSPHORUS: Phosphorus: 3.1 mg/dL (ref 2.5–4.6)

## 2017-07-29 MED ORDER — LORATADINE 10 MG PO TABS
10.0000 mg | ORAL_TABLET | Freq: Every day | ORAL | Status: DC
  Administered 2017-07-29 – 2017-07-30 (×2): 10 mg via ORAL
  Filled 2017-07-29 (×2): qty 1

## 2017-07-29 MED ORDER — MIDODRINE HCL 5 MG PO TABS
5.0000 mg | ORAL_TABLET | Freq: Three times a day (TID) | ORAL | Status: DC
  Administered 2017-07-29 – 2017-07-30 (×2): 5 mg via ORAL
  Filled 2017-07-29 (×4): qty 1

## 2017-07-29 MED ORDER — DEXTROMETHORPHAN POLISTIREX ER 30 MG/5ML PO SUER
30.0000 mg | Freq: Two times a day (BID) | ORAL | Status: DC
Start: 2017-07-29 — End: 2017-07-30
  Administered 2017-07-29 (×2): 30 mg via ORAL
  Filled 2017-07-29 (×4): qty 5

## 2017-07-29 MED ORDER — GUAIFENESIN ER 600 MG PO TB12
600.0000 mg | ORAL_TABLET | Freq: Two times a day (BID) | ORAL | Status: DC
Start: 2017-07-29 — End: 2017-07-30
  Administered 2017-07-29 (×2): 600 mg via ORAL
  Filled 2017-07-29 (×2): qty 1

## 2017-07-29 MED ORDER — DM-GUAIFENESIN ER 30-600 MG PO TB12: 1.0000 | ORAL_TABLET | Freq: Two times a day (BID) | ORAL | Status: DC

## 2017-07-29 NOTE — Progress Notes (Signed)
Post HD assessment    07/29/17 1422  Neurological  Level of Consciousness Alert  Orientation Level Oriented X4  Respiratory  Respiratory Pattern Regular;Unlabored;Dyspnea with exertion  Chest Assessment Chest expansion symmetrical  Cardiac  ECG Monitor Yes  Vascular  R Radial Pulse +2  L Radial Pulse +2  Edema Right lower extremity;Left lower extremity;Generalized  Integumentary  Integumentary (WDL) X  Skin Color Appropriate for ethnicity  Musculoskeletal  Musculoskeletal (WDL) X  Generalized Weakness Yes  Assistive Device None  Gastrointestinal  Bowel Sounds Assessment Active  GU Assessment  Genitourinary (WDL) X  Genitourinary Symptoms  (HD)  Psychosocial  Psychosocial (WDL) WDL

## 2017-07-29 NOTE — Progress Notes (Signed)
Pre HD assessment    07/29/17 1050  Vital Signs  Temp 98.2 F (36.8 C)  Temp Source Oral  Pulse Rate 82  Pulse Rate Source Monitor  Resp 16  BP 112/60  BP Location Right Wrist  BP Method Automatic  Patient Position (if appropriate) Lying  Oxygen Therapy  SpO2 98 %  O2 Device Nasal Cannula  O2 Flow Rate (L/min) 3 L/min  Pain Assessment  Pain Scale 0-10  Pain Score 0  Dialysis Weight  Weight 105.7 kg (233 lb 0.4 oz)  Type of Weight Pre-Dialysis  Time-Out for Hemodialysis  What Procedure? HD  Pt Identifiers(min of two) First/Last Name;MRN/Account#  Correct Site? Yes  Correct Side? Yes  Correct Procedure? Yes  Consents Verified? Yes  Rad Studies Available? N/A  Safety Precautions Reviewed? Yes  Biochemist, clinical Number  (3A)  Station Number 4  UF/Alarm Test Passed  Conductivity: Meter 13.6  Conductivity: Machine  13.5  pH 7.6  Reverse Osmosis main  Normal Saline Lot Number [id number]  Dialyzer Lot Number [id number]  Disposable Set Lot Number [id number]  Machine Temperature 98.6 F (37 C)  Immunologist and Audible Yes  Blood Lines Intact and Secured Yes  Pre Treatment Patient Checks  Vascular access used during treatment Fistula  Hepatitis B Surface Antigen Results Negative  Date Hepatitis B Surface Antigen Drawn 09/04/16  Hepatitis B Surface Antibody  (>10)  Date Hepatitis B Surface Antibody Drawn 05/17/17  Hemodialysis Consent Verified Yes  Hemodialysis Standing Orders Initiated Yes  ECG (Telemetry) Monitor On Yes  Prime Ordered Normal Saline  Length of  DialysisTreatment -hour(s) 3 Hour(s)  Dialyzer Elisio 17H NR  Dialysate 3K, 2.5 Ca  Dialysis Anticoagulant None  Dialysate Flow Ordered 800  Blood Flow Rate Ordered 400 mL/min  Ultrafiltration Goal 2.5 Liters  Pre Treatment Labs Phosphorus  Dialysis Blood Pressure Support Ordered Normal Saline  Education / Care Plan  Dialysis Education Provided Yes  Documented Education in Care Plan Yes  Fistula /  Graft Left Forearm Arteriovenous fistula  No Placement Date or Time found.   Placed prior to admission: Yes  Orientation: Left  Access Location: Forearm  Access Type: Arteriovenous fistula  Site Condition No complications  Fistula / Graft Assessment Present;Thrill;Bruit  Drainage Description None

## 2017-07-29 NOTE — Progress Notes (Signed)
Patient is floor care status.  Not seen officially by [facility] service.  After transfer, PCCM will sign off. Please call if we can be of further assistance    Billy Fischer, MD PCCM service Mobile [phone] Pager [phone] 07/29/2017 8:38 PM

## 2017-07-29 NOTE — Progress Notes (Signed)
Pre HD assessment    07/29/17 1051  Neurological  Level of Consciousness Alert  Orientation Level Oriented X4  Respiratory  Respiratory Pattern Regular;Unlabored  Chest Assessment Chest expansion symmetrical  Cardiac  ECG Monitor Yes  Vascular  R Radial Pulse +2  L Radial Pulse +2  Edema Right lower extremity;Left lower extremity;Generalized  Integumentary  Integumentary (WDL) X  Skin Color Appropriate for ethnicity  Musculoskeletal  Musculoskeletal (WDL) X  Generalized Weakness Yes  Assistive Device None  Gastrointestinal  Bowel Sounds Assessment Active  GU Assessment  Genitourinary (WDL) X  Genitourinary Symptoms  (HD)  Psychosocial  Psychosocial (WDL) WDL

## 2017-07-29 NOTE — Progress Notes (Signed)
Post HD assessment. Pt tolerated tx well without c/o. Pt experienced a drop in BP during tx, UF was turned off and BFR decreased, MD aware. Net UF 1109, goal not met.    07/29/17 1420  Vital Signs  Temp 98.1 F (36.7 C)  Temp Source Oral  Pulse Rate 86  Pulse Rate Source Monitor  Resp (!) 21  BP (!) 90/47  BP Location Right Arm  BP Method Automatic  Patient Position (if appropriate) Lying  Oxygen Therapy  SpO2 98 %  O2 Device Nasal Cannula  O2 Flow Rate (L/min) 3 L/min  Dialysis Weight  Weight 104.3 kg (229 lb 15 oz)  Type of Weight Post-Dialysis  Post-Hemodialysis Assessment  Rinseback Volume (mL) 250 mL  KECN 64.5 V  Dialyzer Clearance Lightly streaked  Duration of HD Treatment -hour(s) 3 hour(s)  Hemodialysis Intake (mL) 500 mL  UF Total -Machine (mL) 1609 mL  Net UF (mL) 1109 mL  Tolerated HD Treatment Yes  AVG/AVF Arterial Site Held (minutes) 10 minutes  AVG/AVF Venous Site Held (minutes) 10 minutes  Education / Care Plan  Dialysis Education Provided Yes  Documented Education in Care Plan Yes  Fistula / Graft Left Forearm Arteriovenous fistula  No Placement Date or Time found.   Placed prior to admission: Yes  Orientation: Left  Access Location: Forearm  Access Type: Arteriovenous fistula  Site Condition No complications  Fistula / Graft Assessment Present;Thrill;Bruit  Status Deaccessed  Drainage Description None

## 2017-07-29 NOTE — Progress Notes (Signed)
HD tx start   07/29/17 1101  Vital Signs  Pulse Rate 78  Pulse Rate Source Monitor  Resp 14  BP (!) 117/58  BP Location Right Wrist  BP Method Automatic  Patient Position (if appropriate) Lying  Oxygen Therapy  SpO2 100 %  O2 Device Nasal Cannula  O2 Flow Rate (L/min) 3 L/min  During Hemodialysis Assessment  Blood Flow Rate (mL/min) 400 mL/min  Arterial Pressure (mmHg) -170 mmHg  Venous Pressure (mmHg) 150 mmHg  Transmembrane Pressure (mmHg) 70 mmHg  Ultrafiltration Rate (mL/min) 1000 mL/min  Dialysate Flow Rate (mL/min) 800 ml/min  Conductivity: Machine  13.5  HD Safety Checks Performed Yes  Dialysis Fluid Bolus Normal Saline  Bolus Amount (mL) 250 mL  Intra-Hemodialysis Comments Tx initiated  Fistula / Graft Left Forearm Arteriovenous fistula  No Placement Date or Time found.   Placed prior to admission: Yes  Orientation: Left  Access Location: Forearm  Access Type: Arteriovenous fistula  Status Accessed  Needle Size 15

## 2017-07-29 NOTE — Progress Notes (Signed)
[facility] [place] at [facility]   PATIENT NAME: Ariel Smith    MR#:  [id number]  DATE OF BIRTH:  [birth year]  SUBJECTIVE:  CHIEF COMPLAINT: Patient is off BiPAP, for HD today, feeling better  REVIEW OF SYSTEMS:  CONSTITUTIONAL: No fever, fatigue or weakness.  EYES: No blurred or double vision.  EARS, NOSE, AND THROAT: No tinnitus or ear pain.  RESPIRATORY:  reports cough, shortness of breath are improving, no  wheezing denies hemoptysis.  CARDIOVASCULAR: No chest pain, orthopnea, edema.  GASTROINTESTINAL: No nausea, vomiting, diarrhea or abdominal pain.  GENITOURINARY: No dysuria, hematuria.  ENDOCRINE: No polyuria, nocturia,  HEMATOLOGY: No anemia, easy bruising or bleeding SKIN: No rash or lesion. MUSCULOSKELETAL: No joint pain or arthritis.   NEUROLOGIC: No tingling, numbness, weakness.  PSYCHIATRY: No anxiety or depression.   DRUG ALLERGIES:   Allergies  Allergen Reactions  . Nodolor [Isometheptene-Dichloral-Apap] Other (See Comments)    Reaction:  Headaches     VITALS:  Blood pressure (!) 90/47, pulse 86, temperature 98.1 F (36.7 C), temperature source Oral, resp. rate (!) 21, height 5\' 4"  (1.626 m), weight 104.3 kg (229 lb 15 oz), SpO2 98 %.  PHYSICAL EXAMINATION:  GENERAL:  72 y.o.-year-old patient lying in the bed with no acute distress.  EYES: Pupils equal, round, reactive to light and accommodation. No scleral icterus. Extraocular muscles intact.  HEENT: Head atraumatic, normocephalic. Oropharynx and nasopharynx clear.  NECK:  Supple, no jugular venous distention. No thyroid enlargement, no tenderness.  LUNGS: MOD  breath sounds bilaterally, no wheezing, rales,rhonchi or crepitation. No use of accessory muscles of respiration.  CARDIOVASCULAR: S1, S2 normal. No murmurs, rubs, or gallops.  ABDOMEN: Soft, nontender, nondistended. Bowel sounds present. No organomegaly or mass.  EXTREMITIES: No pedal edema, cyanosis, or clubbing.   NEUROLOGIC: Cranial nerves II through XII are intact. Muscle strength 5/5 in all extremities. Sensation intact. Gait not checked.  PSYCHIATRIC: The patient is alert and oriented x 3.  SKIN: No obvious rash, lesion, or ulcer.    LABORATORY PANEL:   CBC Recent Labs  Lab 07/28/17 0126  WBC 13.7*  HGB 9.5*  HCT 29.6*  PLT 99*   ------------------------------------------------------------------------------------------------------------------  Chemistries  Recent Labs  Lab 07/28/17 0126  NA 140  K 4.5  CL 103  CO2 23  GLUCOSE 122*  BUN 85*  CREATININE 11.05*  CALCIUM 8.0*  AST 19  ALT 13*  ALKPHOS 90  BILITOT 0.7   ------------------------------------------------------------------------------------------------------------------  Cardiac Enzymes Recent Labs  Lab 07/28/17 0126  TROPONINI 0.05*   ------------------------------------------------------------------------------------------------------------------  RADIOLOGY:  Dg Chest Port 1 View  Result Date: 07/29/2017 CLINICAL DATA:  Shortness of breath EXAM: PORTABLE CHEST 1 VIEW COMPARISON:  Yesterday FINDINGS: Chronic cardiomegaly. Status post transcatheter aortic valve replacement. Diffuse interstitial coarsening and low lung volumes. New focal peripheral opacity at the left base. Airspace type opacity at the medial right base that is borderline improved. No visible pneumothorax. IMPRESSION: 1. Improved perihilar opacity but increased opacity at the peripheral left base. Atelectasis or pneumonia could give this appearance. 2. Chronically low lung volumes. Electronically Signed   By: Marnee Spring M.D.   On: 07/29/2017 07:37   Dg Chest Port 1 View  Result Date: 07/28/2017 CLINICAL DATA:  Shortness of breath.  Missed dialysis 2 days ago. EXAM: PORTABLE CHEST 1 VIEW COMPARISON:  Radiograph 07/12/2017 FINDINGS: Unchanged heart size with prosthetic aortic valve. New bilateral perihilar opacities suspicious for pulmonary  edema. No confluent airspace disease. No pneumothorax or large  pleural effusion. Stable osseous structures. IMPRESSION: Moderate pulmonary edema which is new from prior exam. Electronically Signed   By: Rubye Oaks M.D.   On: 07/28/2017 02:12    EKG:   Orders placed or performed during the hospital encounter of 07/28/17  . ED EKG  . ED EKG  . EKG 12-Lead  . EKG 12-Lead    ASSESSMENT AND PLAN:   This is a 72 year old female admitted for respiratory failure. 1.  Respiratory failure: Acute; with hypoxia.   Off bIPAP  Currently improving Pulmonology is following  2.  End-stage renal disease: On dialysis;  For hemodialysis today IP Echocardiogram ordered as per Dr. Windell Hummingbird recommendation  continue PhosLo and Sensipar  3.  COPD: Symptoms are not consistent with COPD exacerbation although the patient has received Solu-Medrol.  She will need to taper steroids as tolerated.  Continue inhaled corticosteroids  4.  Hypertension: Uncontrolled; labetalol as needed plan  5.  Hyperlipidemia: Continue statin therapy  6.  DVT prophylaxis: Eliquis 7.  GI prophylaxis: None    Generalized weakness PT consult   All the records are reviewed and case discussed with Care Management/Social Workerr. Management plans discussed with the patient, intensivist and nephrology   CODE STATUS: DNR  TOTAL TIME TAKING CARE OF THIS PATIENT: 36 minutes.   POSSIBLE D/C IN  DAYS, DEPENDING ON CLINICAL CONDITION.  Note: This dictation was prepared with Dragon dictation along with smaller phrase technology. Any transcriptional errors that result from this process are unintentional.   Ramonita Lab M.D on 07/29/2017 at 3:07 PM  Between 7am to 6pm - Pager - [phone] After 6pm go to [email or web address] - password [email or web address] [facility]  [facility]  Office  [phone]  CC: Primary care physician; [name], MD

## 2017-07-29 NOTE — Progress Notes (Signed)
Pt transferred to med-surg floor, room 209. Report given over the phone to [place], [place]. Pt transferred in wheelchair with all belongings. Will not have telemetry on floor.

## 2017-07-29 NOTE — Progress Notes (Signed)
HD tx end   07/29/17 1411  Vital Signs  Pulse Rate 85  Pulse Rate Source Monitor  Resp 18  BP 90/74  BP Location Right Arm  BP Method Automatic  Patient Position (if appropriate) Lying  Oxygen Therapy  SpO2 99 %  O2 Device Nasal Cannula  O2 Flow Rate (L/min) 3 L/min  During Hemodialysis Assessment  Dialysis Fluid Bolus Normal Saline  Bolus Amount (mL) 250 mL  Intra-Hemodialysis Comments Tx completed

## 2017-07-29 NOTE — Progress Notes (Signed)
[facility], [place] 07/29/17  Subjective:   Patient's daughter reports that she woke up in the middle of the night with sudden onset of shortness of breath and tachypnea.  She tried to give her a breathing treatment which did not help therefore she brought her to the emergency room for evaluation.  Patient requires 3 L of oxygen at baseline.  Chest x-ray shows pulmonary edema  Patient underwent urgent hemodialysis yesterday.  3 L of fluid was removed.  She feels better clinically but her chest x-ray still shows pulmonary edema.   Objective:  Vital signs in last 24 hours:  Temp:  [97.8 F (36.6 C)-98.7 F (37.1 C)] 98.2 F (36.8 C) (04/11 1050) Pulse Rate:  [78-108] 85 (04/11 1330) Resp:  [13-26] 15 (04/11 1330) BP: (93-187)/(46-113) 95/68 (04/11 1330) SpO2:  [94 %-100 %] 98 % (04/11 1330) Weight:  [227 lb 1.2 oz (103 kg)-242 lb 8.1 oz (110 kg)] 233 lb 0.4 oz (105.7 kg) (04/11 1050)  Weight change: 8 lb 12.9 oz (3.994 kg) Filed Weights   07/28/17 2147 07/29/17 0500 07/29/17 1050  Weight: 236 lb 1.8 oz (107.1 kg) 227 lb 1.2 oz (103 kg) 233 lb 0.4 oz (105.7 kg)    Intake/Output:    Intake/Output Summary (Last 24 hours) at 07/29/2017 1350 Last data filed at 07/29/2017 0600 Gross per 24 hour  Intake 240 ml  Output 3107 ml  Net -2867 ml     Physical Exam: General:  No acute distress, laying in the bed  HEENT  anicteric, moist oral mucous membranes  Neck  supple  Pulm/lungs  mild basilar crackles, oxygen by nasal cannula  CVS/Heart  regular, tachycardic, prominent crescendo systolic murmur  Abdomen:   Soft, nontender  Extremities:  No significant edema  Neurologic:  Alert, oriented  Skin:  No acute rashes  Access:  AV fistula       Basic Metabolic Panel:  Recent Labs  Lab 07/28/17 0126 07/28/17 1933 07/29/17 1152  NA 140  --   --   K 4.5  --   --   CL 103  --   --   CO2 23  --   --   GLUCOSE 122*  --   --   BUN 85*  --   --    CREATININE 11.05*  --   --   CALCIUM 8.0*  --   --   PHOS  --  3.0 3.1     CBC: Recent Labs  Lab 07/28/17 0126  WBC 13.7*  NEUTROABS 11.8*  HGB 9.5*  HCT 29.6*  MCV 105.8*  PLT 99*      Lab Results  Component Value Date   HEPBSAG Negative 10/25/2015      Microbiology:  Recent Results (from the past 240 hour(s))  MRSA PCR Screening     Status: None   Collection Time: 07/28/17  3:14 PM  Result Value Ref Range Status   MRSA by PCR NEGATIVE NEGATIVE Final    Comment:        The GeneXpert MRSA Assay (FDA approved for NASAL specimens only), is one component of a comprehensive MRSA colonization surveillance program. It is not intended to diagnose MRSA infection nor to guide or monitor treatment for MRSA infections. Performed at [facility], [address] [place], [place] [address]     Coagulation Studies: No results for input(s): LABPROT, INR in the last 72 hours.  Urinalysis: Recent Labs    07/28/17 1145  COLORURINE  YELLOW*  LABSPEC 1.013  PHURINE 9.0*  GLUCOSEU 150*  HGBUR SMALL*  BILIRUBINUR NEGATIVE  KETONESUR NEGATIVE  PROTEINUR 30*  NITRITE NEGATIVE  LEUKOCYTESUR NEGATIVE      Imaging: Dg Chest Port 1 View  Result Date: 07/29/2017 CLINICAL DATA:  Shortness of breath EXAM: PORTABLE CHEST 1 VIEW COMPARISON:  Yesterday FINDINGS: Chronic cardiomegaly. Status post transcatheter aortic valve replacement. Diffuse interstitial coarsening and low lung volumes. New focal peripheral opacity at the left base. Airspace type opacity at the medial right base that is borderline improved. No visible pneumothorax. IMPRESSION: 1. Improved perihilar opacity but increased opacity at the peripheral left base. Atelectasis or pneumonia could give this appearance. 2. Chronically low lung volumes. Electronically Signed   By: Marnee Spring M.D.   On: 07/29/2017 07:37   Dg Chest Port 1 View  Result Date: 07/28/2017 CLINICAL DATA:  Shortness of breath.   Missed dialysis 2 days ago. EXAM: PORTABLE CHEST 1 VIEW COMPARISON:  Radiograph 07/12/2017 FINDINGS: Unchanged heart size with prosthetic aortic valve. New bilateral perihilar opacities suspicious for pulmonary edema. No confluent airspace disease. No pneumothorax or large pleural effusion. Stable osseous structures. IMPRESSION: Moderate pulmonary edema which is new from prior exam. Electronically Signed   By: Rubye Oaks M.D.   On: 07/28/2017 02:12     Medications:    . apixaban  2.5 mg Oral BID  . calcium acetate  1,334 mg Oral TID WC  . cinacalcet  30 mg Oral Q supper  . guaiFENesin  600 mg Oral BID   And  . dextromethorphan  30 mg Oral BID  . docusate sodium  100 mg Oral BID  . famotidine  40 mg Oral QPM  . feeding supplement (NEPRO CARB STEADY)  237 mL Oral BID BM  . loratadine  10 mg Oral Daily  . multivitamin with minerals  1 tablet Oral Daily  . pantoprazole  40 mg Oral QHS  . sertraline  50 mg Oral Daily  . torsemide  100 mg Oral Daily  . traZODone  100 mg Oral QHS   acetaminophen **OR** acetaminophen, ALPRAZolam, calcium acetate, diphenhydrAMINE, ipratropium-albuterol, nitroGLYCERIN, ondansetron **OR** ondansetron (ZOFRAN) IV, traMADol  Assessment/ Plan:  72 y.o. Caucasian female with chronic grade 2 diastolic congestive heart failure, COPD requiring oxygen supplementation by nasal cannula, end-stage renal disease, GERD, history of GI bleed, history of DVT in the leg, hypertension, history of transcatheter aortic valve replacement for severe aortic stenosis  [facility]/TTS/Dr. Kolluru/ LUE AVF  1.  End-stage renal disease 2.  Acute pulmonary edema likely secondary to significant diastolic dysfunction 3.  Secondary hyperparathyroidism 4.  Anemia of chronic kidney disease  Plan: Extra hemodialysis treatment today with goal volume removal of up to 2 L as tolerated Start torsemide on a daily basis Epogen with hemodialysis Continue home dose of phosphorus  binders Discussed with [name].  2D echo ordered to assess aortic valve.  Patient can follow-up outpatient Okay to discharge home from renal standpoint.    LOS: 1 Emanuelle Hammerstrom 4/11/20191:50 PM  [facility] [address] [place], [place] [phone]  Note: This note was prepared with Dragon dictation. Any transcription errors are unintentional

## 2017-07-29 NOTE — Progress Notes (Signed)
*  PRELIMINARY RESULTS* Echocardiogram 2D Echocardiogram has been performed.  Cristela Blue 07/29/2017, 5:04 PM

## 2017-07-29 NOTE — Progress Notes (Signed)
Advised Dr Bard Herbert bp 118/37 map of 61. Per MD maintain map of 55 or greater. MD states that patient can still go to floor and does not need to be watch.

## 2017-07-30 MED ORDER — TORSEMIDE 100 MG PO TABS: 100.0000 mg | ORAL_TABLET | Freq: Every day | ORAL | 0 refills | Status: DC

## 2017-07-30 MED ORDER — EPOETIN ALFA 10000 UNIT/ML IJ SOLN
4000.0000 [IU] | INTRAMUSCULAR | Status: DC
  Administered 2017-07-30: 4000 [IU] via INTRAVENOUS

## 2017-07-30 MED ORDER — MIDODRINE HCL 5 MG PO TABS: 5.0000 mg | ORAL_TABLET | Freq: Three times a day (TID) | ORAL | 0 refills | Status: AC

## 2017-07-30 MED ORDER — MIDODRINE HCL 5 MG PO TABS: 5.0000 mg | ORAL_TABLET | Freq: Three times a day (TID) | ORAL | 0 refills | Status: DC

## 2017-07-30 MED ORDER — LORATADINE 10 MG PO TABS: 10.0000 mg | ORAL_TABLET | Freq: Every day | ORAL | 0 refills | Status: AC

## 2017-07-30 MED ORDER — GUAIFENESIN ER 600 MG PO TB12: 600.0000 mg | ORAL_TABLET | Freq: Two times a day (BID) | ORAL | 0 refills | Status: DC | PRN

## 2017-07-30 MED ORDER — NEPRO/CARBSTEADY PO LIQD
237.0000 mL | Freq: Two times a day (BID) | ORAL | 0 refills | Status: DC
Start: 2017-07-30 — End: 2017-08-27

## 2017-07-30 MED ORDER — GUAIFENESIN ER 600 MG PO TB12: 600.0000 mg | ORAL_TABLET | Freq: Two times a day (BID) | ORAL | 0 refills | Status: AC | PRN

## 2017-07-30 MED ORDER — NEPRO/CARBSTEADY PO LIQD: 237.0000 mL | Freq: Two times a day (BID) | ORAL | 0 refills | Status: DC

## 2017-07-30 MED ORDER — LORATADINE 10 MG PO TABS: 10.0000 mg | ORAL_TABLET | Freq: Every day | ORAL | 0 refills | Status: DC

## 2017-07-30 NOTE — Progress Notes (Signed)
[facility], [place] 07/30/17  Subjective:   Patient evaluated during hemodialysis. Appears to be tolerating well.   Objective:  Vital signs in last 24 hours:  Temp:  [98.1 F (36.7 C)-98.4 F (36.9 C)] 98.2 F (36.8 C) (04/12 1025) Pulse Rate:  [70-94] 74 (04/12 1100) Resp:  [13-23] 21 (04/12 1100) BP: (88-125)/(46-77) 122/49 (04/12 1100) SpO2:  [94 %-100 %] 99 % (04/12 1030) Weight:  [100.8 kg (222 lb 4.8 oz)-104.3 kg (229 lb 15 oz)] 100.8 kg (222 lb 4.8 oz) (04/12 0451)  Weight change: 0.1 kg (3.5 oz) Filed Weights   07/29/17 1050 07/29/17 1420 07/30/17 0451  Weight: 105.7 kg (233 lb 0.4 oz) 104.3 kg (229 lb 15 oz) 100.8 kg (222 lb 4.8 oz)    Intake/Output:    Intake/Output Summary (Last 24 hours) at 07/30/2017 1114 Last data filed at 07/30/2017 0841 Gross per 24 hour  Intake 220 ml  Output 1109 ml  Net -889 ml     Physical Exam: General:  No acute distress, laying in the bed  HEENT  anicteric, moist oral mucous membranes  Neck  supple  Pulm/lungs  mild basilar crackles  CVS/Heart  regular, 2/6 SEM  Abdomen:   Soft, nontender, BS present  Extremities:  No significant edema  Neurologic:  Alert, oriented x 3, follows commands  Skin:  No acute rashes  Access:  AV fistula       Basic Metabolic Panel:  Recent Labs  Lab 07/28/17 0126 07/28/17 1933 07/29/17 1152  NA 140  --   --   K 4.5  --   --   CL 103  --   --   CO2 23  --   --   GLUCOSE 122*  --   --   BUN 85*  --   --   CREATININE 11.05*  --   --   CALCIUM 8.0*  --   --   PHOS  --  3.0 3.1     CBC: Recent Labs  Lab 07/28/17 0126  WBC 13.7*  NEUTROABS 11.8*  HGB 9.5*  HCT 29.6*  MCV 105.8*  PLT 99*      Lab Results  Component Value Date   HEPBSAG Negative 10/25/2015      Microbiology:  Recent Results (from the past 240 hour(s))  MRSA PCR Screening     Status: None   Collection Time: 07/28/17  3:14 PM  Result Value Ref Range Status   MRSA by PCR  NEGATIVE NEGATIVE Final    Comment:        The GeneXpert MRSA Assay (FDA approved for NASAL specimens only), is one component of a comprehensive MRSA colonization surveillance program. It is not intended to diagnose MRSA infection nor to guide or monitor treatment for MRSA infections. Performed at [facility], [address] [place], [place] [address]     Coagulation Studies: No results for input(s): LABPROT, INR in the last 72 hours.  Urinalysis: Recent Labs    07/28/17 1145  COLORURINE YELLOW*  LABSPEC 1.013  PHURINE 9.0*  GLUCOSEU 150*  HGBUR SMALL*  BILIRUBINUR NEGATIVE  KETONESUR NEGATIVE  PROTEINUR 30*  NITRITE NEGATIVE  LEUKOCYTESUR NEGATIVE      Imaging: Dg Chest Port 1 View  Result Date: 07/29/2017 CLINICAL DATA:  Shortness of breath EXAM: PORTABLE CHEST 1 VIEW COMPARISON:  Yesterday FINDINGS: Chronic cardiomegaly. Status post transcatheter aortic valve replacement. Diffuse interstitial coarsening and low lung volumes. New focal peripheral opacity at the left  base. Airspace type opacity at the medial right base that is borderline improved. No visible pneumothorax. IMPRESSION: 1. Improved perihilar opacity but increased opacity at the peripheral left base. Atelectasis or pneumonia could give this appearance. 2. Chronically low lung volumes. Electronically Signed   By: Marnee Spring M.D.   On: 07/29/2017 07:37     Medications:    . apixaban  2.5 mg Oral BID  . calcium acetate  1,334 mg Oral TID WC  . cinacalcet  30 mg Oral Q supper  . guaiFENesin  600 mg Oral BID   And  . dextromethorphan  30 mg Oral BID  . docusate sodium  100 mg Oral BID  . epoetin (EPOGEN/PROCRIT) injection  4,000 Units Intravenous Q M,W,F-HD  . famotidine  40 mg Oral QPM  . feeding supplement (NEPRO CARB STEADY)  237 mL Oral BID BM  . loratadine  10 mg Oral Daily  . midodrine  5 mg Oral TID WC  . multivitamin with minerals  1 tablet Oral Daily  . pantoprazole  40 mg  Oral QHS  . sertraline  50 mg Oral Daily  . torsemide  100 mg Oral Daily  . traZODone  100 mg Oral QHS   acetaminophen **OR** acetaminophen, ALPRAZolam, calcium acetate, diphenhydrAMINE, ipratropium-albuterol, nitroGLYCERIN, ondansetron **OR** ondansetron (ZOFRAN) IV, traMADol  Assessment/ Plan:  72 y.o. Caucasian female with chronic grade 2 diastolic congestive heart failure, COPD requiring oxygen supplementation by nasal cannula, end-stage renal disease, GERD, history of GI bleed, history of DVT in the leg, hypertension, history of transcatheter aortic valve replacement for severe aortic stenosis  [facility]/TTS/LUE AVF  1.  End-stage renal disease 2.  Acute pulmonary edema likely secondary to significant diastolic dysfunction 3.  Secondary hyperparathyroidism 4.  Anemia of chronic kidney disease  Plan: Patient seen and evaluated during dialysis.  Tolerating well.  Pulmonary edema improved with HD.  Start on epogen 4000 units IV with HD today.  Continue to monitor phos, Ca, PTH as outpt.  Counseled pt on fluid and salt restriction again today.      LOS: 2 Mady Haagensen 4/12/201911:14 AM  [facility] [place], [place] [phone]  Note: This note was prepared with Dragon dictation. Any transcription errors are unintentional

## 2017-07-30 NOTE — Care Management (Signed)
Chronic HD at [facility]. Chronic home oxygen. Dimas Chyle dialysis liaison notified of discharge.

## 2017-07-30 NOTE — Discharge Summary (Signed)
[facility] [place] [facility]   PATIENT NAME: Ariel Smith    MR#:  [id number]  DATE OF BIRTH:  04/03/1946  DATE OF ADMISSION:  07/28/2017 ADMITTING PHYSICIAN: Arnaldo Natal, MD  DATE OF DISCHARGE: No discharge date for patient encounter.  PRIMARY CARE PHYSICIAN: [name], MD    ADMISSION DIAGNOSIS:  Acute pulmonary edema (HCC) [J81.0] Acute respiratory failure with hypoxia (HCC) [J96.01]  DISCHARGE DIAGNOSIS:  Active Problems:   Acute respiratory failure with hypoxia (HCC)   SECONDARY DIAGNOSIS:   Past Medical History:  Diagnosis Date  . Anemia of chronic disease   . Bronchiectasis (HCC) 08/2014    suggested by thoracic xray  . Chronic diastolic CHF (congestive heart failure) (HCC)    a. echo 10/2014: EF 60-65%, no RWMA, GR1DD; b. 03/2015 Echo: EF 60-65%, no rwma, Gr2 DD.  Marland Kitchen Chronic respiratory failure (HCC)    a. 2/2 COPD; b. on 4-5L via nasal cannula  . COPD (chronic obstructive pulmonary disease) (HCC)   . Depression   . ESRD (end stage renal disease) (HCC) 08/2011   a. on HD (TThSa), L forearm AV fistula, [name]  . Frequent headaches   . GERD (gastroesophageal reflux disease)   . GIB (gastrointestinal bleeding)    a. leading to cessation of warfarin 11/2014  . History of cardiac cath    a. 12/2014 Cath: nl cors. 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  Marland Kitchen History of colon polyps 2013   colonoscopy ([name])  . History of DVT of lower extremity 2009, 2017   a. left sided x2, with PE s/p IVC filter placement, coumadin stopped 2/2 GI bleed in 2016; b. 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  Marland Kitchen HLD (hyperlipidemia)   . HTN (hypertension)   . Morbid obesity (HCC)   . Osteoarthritis   . Osteopenia 01/2013  . Pulmonary embolism (HCC) 2009  . Secondary hyperparathyroidism of renal origin (HCC)   . Severe aortic stenosis    a. echo 10/2014: mod to sev AS; b. 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 03/2015 Echo: EF 60-65%, Gr2 DD, m ild to mod AS (peak grad , mean ).    HOSPITAL COURSE:  This is a 72 year old female admitted for respiratory failure. 1. Respiratory failure: Acute; with hypoxia.  Secondary to acute fluid overload Resolved with hemodialysis  2. End-stage renal disease Stable Nephrology did see patient while in house-received daily dialysis while in house [facility] typically on Mondays/Wednesdays/Fridays  3. COPD without exacerbation Stable Provided breathing treatments as needed, did receive short course of Solu-Medrol while in house  4. Hypertension Stable  5. Hyperlipidemia Controlled on statin therapy  6. Chronic diastolic congestive heart failure exacerbation Secondary to acute fluid overload state/end-stage renal disease Echocardiogram noted for stage II diastolic dysfunction, normal ejection fraction, worsening mild AS-follow with [name] status post discharge for reevaluation   DISCHARGE CONDITIONS:  On day of discharge patient is afebrile, he dynamically stable, tolerating diet, ready for discharge home with resumption of home oxygen follow-up with primary care provider and cardiology as directed, for more specific details please see chart  CONSULTS OBTAINED:  Treatment Team:  Mosetta Pigeon, MD  DRUG ALLERGIES:   Allergies  Allergen Reactions  . Nodolor [Isometheptene-Dichloral-Apap] Other (See Comments)    Reaction:  Headaches     DISCHARGE MEDICATIONS:   Allergies as of 07/30/2017      Reactions   Nodolor [isometheptene-dichloral-apap] Other (See Comments)  Reaction:  Headaches       Medication List    STOP taking these medications   predniSONE 10 MG tablet Commonly known as:  DELTASONE     TAKE these medications   albuterol 108 (90 Base) MCG/ACT inhaler Commonly known as:  VENTOLIN HFA Inhale 2 puffs into the lungs every 4 (four) hours as needed for  wheezing or shortness of breath.   ALPRAZolam 0.5 MG dissolvable tablet Commonly known as:  NIRAVAM TAKE ONE-HALF TO ONE TABLET BY MOUTH ONCE DAILY AS NEEDED FOR ANXIETY   atorvastatin 40 MG tablet Commonly known as:  LIPITOR TAKE 1 TABLET BY MOUTH ONCE DAILY AT  6PM. NEEDS OFFICE VISIT.   calcium acetate 667 MG capsule Commonly known as:  PHOSLO Take 1,334 mg by mouth 3 (three) times daily with meals.   calcium acetate 667 MG capsule Commonly known as:  PHOSLO Take 1,334 mg by mouth as needed (with snacks).   cinacalcet 30 MG tablet Commonly known as:  SENSIPAR Take 30 mg by mouth daily.   clopidogrel 75 MG tablet Commonly known as:  PLAVIX TAKE 1 TABLET BY MOUTH ONCE DAILY WITH BREAKFAST   diphenhydrAMINE 25 MG tablet Commonly known as:  BENADRYL Take 25 mg by mouth every 6 (six) hours as needed for allergies.   ELIQUIS 2.5 MG Tabs tablet Generic drug:  apixaban TAKE 1 TABLET BY MOUTH TWICE DAILY   famotidine 40 MG tablet Commonly known as:  PEPCID TAKE 1 TABLET BY MOUTH IN THE EVENING   feeding supplement (NEPRO CARB STEADY) Liqd Take 237 mLs by mouth 2 (two) times daily between meals.   guaiFENesin 600 MG 12 hr tablet Commonly known as:  MUCINEX Take 1 tablet (600 mg total) by mouth 2 (two) times daily as needed for cough.   ipratropium-albuterol 0.5-2.5 (3) MG/3ML Soln Commonly known as:  DUONEB Take 3 mLs by nebulization every 6 (six) hours as needed (shortness of breath/ wheezing).   lidocaine-prilocaine cream Commonly known as:  EMLA Apply 1 application topically as needed (topical anesthesia for hemodialysis if Gebauers and Lidocaine injection are ineffective.).   loratadine 10 MG tablet Commonly known as:  CLARITIN Take 1 tablet (10 mg total) by mouth daily. Start taking on:  07/31/2017   midodrine 5 MG tablet Commonly known as:  PROAMATINE Take 1 tablet (5 mg total) by mouth 3 (three) times daily with meals.   multivitamin with minerals Tabs  tablet Take 1 tablet by mouth daily.   sertraline 50 MG tablet Commonly known as:  ZOLOFT Take 1 tablet (50 mg total) by mouth daily.   torsemide 100 MG tablet Commonly known as:  DEMADEX Take 1 tablet (100 mg total) by mouth daily. Start taking on:  07/31/2017   traMADol 50 MG tablet Commonly known as:  ULTRAM Take 0.5-1 tablets (25-50 mg total) by mouth 2 (two) times daily as needed. What changed:  reasons to take this   traZODone 50 MG tablet Commonly known as:  DESYREL Take 100 mg by mouth at bedtime.   traZODone 50 MG tablet Commonly known as:  DESYREL TAKE 1 & 1/2 (ONE & ONE-HALF) TABLETS BY MOUTH AT BEDTIME        DISCHARGE INSTRUCTIONS:  If you experience worsening of your admission symptoms, develop shortness of breath, life threatening emergency, suicidal or homicidal thoughts you must seek medical attention immediately by calling 911 or calling your MD immediately  if symptoms less severe.  You Must read complete instructions/literature along with all  the possible adverse reactions/side effects for all the Medicines you take and that have been prescribed to you. Take any new Medicines after you have completely understood and accept all the possible adverse reactions/side effects.   Please note  You were cared for by a hospitalist during your hospital stay. If you have any questions about your discharge medications or the care you received while you were in the hospital after you are discharged, you can call the unit and asked to speak with the hospitalist on call if the hospitalist that took care of you is not available. Once you are discharged, your primary care physician will handle any further medical issues. Please note that NO REFILLS for any discharge medications will be authorized once you are discharged, as it is imperative that you return to your primary care physician (or establish a relationship with a primary care physician if you do not have one) for your  aftercare needs so that they can reassess your need for medications and monitor your lab values.    Today   CHIEF COMPLAINT:   Chief Complaint  Patient presents with  . Shortness of Breath    HISTORY OF PRESENT ILLNESS:  patient with past medical history of chronic lung disease including COPD on chronic supplemental oxygen, diastolic heart failure, end-stage renal disease on dialysis, hypertension and hyperlipidemia presents to the emergency department complaining of shortness of breath.  The patient missed her dialysis today because she was feeling unwell.  She rapidly developed dyspnea and wheezing.  EMS administered Solu-Medrol in route.  Chest x-ray was more consistent with pulmonary edema versus infiltrate.  The patient was placed on BiPAP when she arrived to the emergency department and continued to have increased work of breathing and shortness of breath which prompted the emergency department staff to call the hospitalist service for admission.  VITAL SIGNS:  Blood pressure (!) 116/49, pulse 76, temperature 98.2 F (36.8 C), temperature source Oral, resp. rate 19, height 5\' 4"  (1.626 m), weight 100.8 kg (222 lb 4.8 oz), SpO2 99 %.  I/O:    Intake/Output Summary (Last 24 hours) at 07/30/2017 1208 Last data filed at 07/30/2017 0841 Gross per 24 hour  Intake 220 ml  Output 1109 ml  Net -889 ml    PHYSICAL EXAMINATION:  GENERAL:  72 y.o.-year-old patient lying in the bed with no acute distress.  EYES: Pupils equal, round, reactive to light and accommodation. No scleral icterus. Extraocular muscles intact.  HEENT: Head atraumatic, normocephalic. Oropharynx and nasopharynx clear.  NECK:  Supple, no jugular venous distention. No thyroid enlargement, no tenderness.  LUNGS: Normal breath sounds bilaterally, no wheezing, rales,rhonchi or crepitation. No use of accessory muscles of respiration.  CARDIOVASCULAR: S1, S2 normal. No murmurs, rubs, or gallops.  ABDOMEN: Soft, non-tender,  non-distended. Bowel sounds present. No organomegaly or mass.  EXTREMITIES: No pedal edema, cyanosis, or clubbing.  NEUROLOGIC: Cranial nerves II through XII are intact. Muscle strength 5/5 in all extremities. Sensation intact. Gait not checked.  PSYCHIATRIC: The patient is alert and oriented x 3.  SKIN: No obvious rash, lesion, or ulcer.   DATA REVIEW:   CBC Recent Labs  Lab 07/28/17 0126  WBC 13.7*  HGB 9.5*  HCT 29.6*  PLT 99*    Chemistries  Recent Labs  Lab 07/28/17 0126  NA 140  K 4.5  CL 103  CO2 23  GLUCOSE 122*  BUN 85*  CREATININE 11.05*  CALCIUM 8.0*  AST 19  ALT 13*  ALKPHOS 90  BILITOT 0.7    Cardiac Enzymes Recent Labs  Lab 07/28/17 0126  TROPONINI 0.05*    Microbiology Results  Results for orders placed or performed during the hospital encounter of 07/28/17  MRSA PCR Screening     Status: None   Collection Time: 07/28/17  3:14 PM  Result Value Ref Range Status   MRSA by PCR NEGATIVE NEGATIVE Final    Comment:        The GeneXpert MRSA Assay (FDA approved for NASAL specimens only), is one component of a comprehensive MRSA colonization surveillance program. It is not intended to diagnose MRSA infection nor to guide or monitor treatment for MRSA infections. Performed at [facility], [address] [place] [place], [place] [address]     RADIOLOGY:  Dg Chest Port 1 View  Result Date: 07/29/2017 CLINICAL DATA:  Shortness of breath EXAM: PORTABLE CHEST 1 VIEW COMPARISON:  Yesterday FINDINGS: Chronic cardiomegaly. Status post transcatheter aortic valve replacement. Diffuse interstitial coarsening and low lung volumes. New focal peripheral opacity at the left base. Airspace type opacity at the medial right base that is borderline improved. No visible pneumothorax. IMPRESSION: 1. Improved perihilar opacity but increased opacity at the peripheral left base. Atelectasis or pneumonia could give this appearance. 2. Chronically low lung volumes.  Electronically Signed   By: Marnee Spring M.D.   On: 07/29/2017 07:37    EKG:   Orders placed or performed during the hospital encounter of 07/28/17  . ED EKG  . ED EKG  . EKG 12-Lead  . EKG 12-Lead      Management plans discussed with the patient, family and they are in agreement.  CODE STATUS:     Code Status Orders  (From admission, onward)        Start     Ordered   07/28/17 1328  Do not attempt resuscitation (DNR)  Continuous    Question Answer Comment  In the event of cardiac or respiratory ARREST Do not call a "code blue"   In the event of cardiac or respiratory ARREST Do not perform Intubation, CPR, defibrillation or ACLS   In the event of cardiac or respiratory ARREST Use medication by any route, position, wound care, and other measures to relive pain and suffering. May use oxygen, suction and manual treatment of airway obstruction as needed for comfort.   Comments rn may pronounce      07/28/17 1328    Code Status History    Date Active Date Inactive Code Status Order ID Comments User Context   07/13/2017 1039 07/14/2017 2337 DNR [id number]  Ramonita Lab, MD Inpatient   07/12/2017 1006 07/13/2017 1038 Full Code [id number]  Ramonita Lab, MD Inpatient   03/04/2017 1026 03/05/2017 2331 Full Code [id number]  Alford Highland, MD ED   07/09/2016 0948 07/09/2016 1330 Full Code [id number]  [name], MD Inpatient   10/21/2015 2154 10/25/2015 2014 Full Code [id number]  Gracelyn Nurse, MD Inpatient   02/19/2015 1026 02/22/2015 1222 Full Code [id number]  Purcell Nails, MD Inpatient   01/18/2015 1354 01/18/2015 2158 Full Code [id number]  Tonny Bollman, MD Inpatient   12/28/2014 1611 12/31/2014 1446 Full Code [id number]  Houston Siren, MD ED   12/21/2014 0135 12/22/2014 1921 Full Code [id number]  Oralia Manis, MD Inpatient   12/11/2014 1418 12/14/2014 2010 DNR [id number]  Crissie Figures, MD Inpatient   11/12/2014 1251 11/22/2014 2133 DNR [id number]  Alford Highland, MD Inpatient    11/09/2014 1431 11/12/2014 1251 Full Code [id number]  Ramonita Lab, MD Inpatient    Advance Directive Documentation     Most Recent Value  Type of Advance Directive  Healthcare Power of Attorney  Pre-existing out of facility DNR order (yellow form or pink MOST form)  -  "MOST" Form in Place?  -      TOTAL TIME TAKING CARE OF THIS PATIENT: 45 minutes.    Evelena Asa Stiles Maxcy M.D on 07/30/2017 at 12:08 PM  Between 7am to 6pm - Pager - [phone]  After 6pm go to [email or web address] - password [email or web address]  [facility]  Office  [phone]  CC: Primary care physician; [name], MD   Note: This dictation was prepared with Dragon dictation along with smaller phrase technology. Any transcriptional errors that result from this process are unintentional.

## 2017-07-30 NOTE — Progress Notes (Signed)
This note also relates to the following rows which could not be included: Pulse Rate - Cannot attach notes to unvalidated device data Resp - Cannot attach notes to unvalidated device data BP - Cannot attach notes to unvalidated device data  Hd completed  

## 2017-07-30 NOTE — Progress Notes (Signed)
Hd started  

## 2017-07-30 NOTE — Progress Notes (Signed)
Pre dialysis assessment 

## 2017-07-30 NOTE — Progress Notes (Signed)
Discharge teaching given to patient, patient verbalized understanding and had no questions. Patient IV removed. Patient will be transported home by family. All patient belongings gathered prior to leaving.  

## 2017-08-02 ENCOUNTER — Inpatient Hospital Stay: Payer: Medicare Other | Admitting: Pulmonary Disease

## 2017-08-02 ENCOUNTER — Encounter: Payer: Self-pay | Admitting: Internal Medicine

## 2017-08-02 ENCOUNTER — Ambulatory Visit: Payer: Medicare Other | Admitting: Internal Medicine

## 2017-08-02 VITALS — BP 100/62 | HR 99 | Ht 64.0 in | Wt 222.0 lb

## 2017-08-02 DIAGNOSIS — J9611 Chronic respiratory failure with hypoxia: Secondary | ICD-10-CM

## 2017-08-02 NOTE — Progress Notes (Signed)
PULMONARY OFFICE FOLLOW UP NOTE   Reason for consultation: COPD, dyspnea  PT PROFILE: 72 y.o. F former smoker previously followed by Dr Meredeth Ide with oxygen dependence, apparent COPD and disabling dyspnea.  PFTs 01/19/15: normal spirometry, lung volumes and DLCO invalid PFTs 11/07/15: unable to perform 11/07/15: 150 meters. Desaturation to 77% despite O2 @ 2 LPM [place] CT chest 01/25/16: Mild cardiomegaly with evidence of mild interstitial pulmonary edema; findings suggestive of mild congestive heart failure CXR 11/07/15: NACPD Hospitalization 07/03-07/07/17: for LLE DVT (recurrent). Discharged on Eliquis ROV 11/14/15: No major changes in symptoms, class IV dyspnea. Severe hypoxemia and exercise related desaturation is unexplained. Previous spirometry normal. CXR normal. Echocardiogram with bubble study ordered Echocardiogram with bubble study: LVEF 55-60%. Bioprosthetic AoV. Mild-mod MR. LA moderately dilated. RA/RV normal. No R>L shunt ROV 10/26/16: chief complaint of cough but also disabling dyspnea. Symbicort and albuterol not helpful.   SUBJ: Patient is a 72 year old female who normally sees Dr. Sung Amabile.  Last seen here in July 2018, at that time noted to have paroxysms of severe NP cough occurring at least daily and class IV dyspnea.  Last visit the patient was asked to continue Symbicort, and medications for GERD.  She was admitted recently to the hospital for 2 days, being discharged 3 days ago, for pulmonary edema thought to be secondary to fluid overload, and returns today as a hospital discharge follow-up. She was admitted before that for bronchitis.  Since getting out of the hospital her breathing has been better. She gets around her house with a cane, her daughter lives with her and she gets home health care.  She can cook and do some basic bathing on her own, but she gets winded. She is on 3 L oxygen all day and with sleep.   She was taking symbicort and albuterol but they did not  help so were stopped. She continued to have a chronic cough. Her daughter is present today and gives much of the history. She used to have a 24-7 cough, they did some research on Google and found that atorvastin can be related to cough, so they have stopped atorvastatin and noticed that the cough got better.    She can get around her house, but rarely leaves the house without assistance.  OBJ:  Vitals:   08/02/17 1518 08/02/17 1522  BP:  100/62  Pulse:  99  SpO2:  95%  Weight: 222 lb (100.7 kg)   Height: 5\' 4"  (1.626 m)   3 lpm pulse  EXAM:  Gen: NAD HEENT: WNL Neck: No JVD noted  Lungs: full, slightly coarse BS, no wheezes Cardiovascular: RRR, prominent S2 Abdomen: Obese, soft, nontender, normal BS Ext: without clubbing, cyanosis. trace symmetric edema Neuro: grossly intact Skin: Limited exam, no lesions noted  DATA:   BMP Latest Ref Rng & Units 07/28/2017 07/13/2017 07/12/2017  Glucose 65 - 99 mg/dL 277(O) 242(P) 99  BUN 6 - 20 mg/dL 53(I) 14(E) 31(V)  Creatinine 0.44 - 1.00 mg/dL 40.08(Q) 7.61(P) 5.09(T)  Sodium 135 - 145 mmol/L 140 139 138  Potassium 3.5 - 5.1 mmol/L 4.5 3.8 4.6  Chloride 101 - 111 mmol/L 103 97(L) 101  CO2 22 - 32 mmol/L 23 28 20(L)  Calcium 8.9 - 10.3 mg/dL 8.0(L) 8.6(L) 7.9(L)    CBC Latest Ref Rng & Units 07/28/2017 07/13/2017 07/12/2017  WBC 3.6 - 11.0 K/uL 13.7(H) 5.7 6.8  Hemoglobin 12.0 - 16.0 g/dL 2.6(Z) 11.0(L) 11.1(L)  Hematocrit 35.0 - 47.0 % 29.6(L) 33.2(L) 34.4(L)  Platelets 150 - 440 K/uL 99(L) 112(L) 137(L)    CXR: NACPD   IMPRESSION:   1) Chronic hypoxic and hypercarbic respiratory failure 2) History of recurrent DVT 3) Obesity 4) Deconditioning/Debility.  Chronic cough, doing better after stopping atorvastatin.    PLAN:  Can discontinue inhalers as they do not seem to help.  Advance activity as tolerated.  Can start with 1 pound weights. Call back if any respiratory issues or further concerns.   Follow up in 6-8  weeks  Deep Nicholos Johns, MD.   Board Certified in Internal Medicine, Pulmonary Medicine, Critical Care Medicine, and Sleep Medicine.  [facility] Office Number: [phone] Pager: [phone]  Santiago Glad, M.D.  Billy Fischer, M.D  08/02/2017 2:17 PM

## 2017-08-02 NOTE — Patient Instructions (Signed)
Follow up as needed

## 2017-08-05 ENCOUNTER — Encounter (INDEPENDENT_AMBULATORY_CARE_PROVIDER_SITE_OTHER): Payer: Self-pay

## 2017-08-05 LAB — BLOOD GAS, VENOUS
ACID-BASE DEFICIT: 0.5 mmol/L (ref 0.0–2.0)
Bicarbonate: 24.2 mmol/L (ref 20.0–28.0)
FIO2: 28
PATIENT TEMPERATURE: 37
pCO2, Ven: 39 mmHg — ABNORMAL LOW (ref 44.0–60.0)
pH, Ven: 7.4 (ref 7.250–7.430)

## 2017-08-06 ENCOUNTER — Ambulatory Visit: Payer: Medicare Other | Admitting: Cardiovascular Disease

## 2017-08-10 ENCOUNTER — Inpatient Hospital Stay: Payer: Medicare Other | Admitting: Family Medicine

## 2017-08-19 ENCOUNTER — Encounter: Admission: RE | Payer: Self-pay | Source: Ambulatory Visit

## 2017-08-19 ENCOUNTER — Ambulatory Visit: Admission: RE | Admit: 2017-08-19 | Payer: Medicare Other | Source: Ambulatory Visit | Admitting: Vascular Surgery

## 2017-08-19 SURGERY — A/V FISTULAGRAM
Anesthesia: Moderate Sedation | Laterality: Left

## 2017-08-24 ENCOUNTER — Other Ambulatory Visit: Payer: Self-pay

## 2017-08-24 NOTE — Telephone Encounter (Signed)
Opened in error

## 2017-08-27 ENCOUNTER — Ambulatory Visit: Payer: Medicare Other | Admitting: Cardiovascular Disease

## 2017-08-27 ENCOUNTER — Encounter: Payer: Self-pay | Admitting: Cardiovascular Disease

## 2017-08-27 VITALS — BP 104/60 | HR 83 | Ht 64.0 in | Wt 223.2 lb

## 2017-08-27 DIAGNOSIS — N186 End stage renal disease: Secondary | ICD-10-CM | POA: Diagnosis not present

## 2017-08-27 DIAGNOSIS — J449 Chronic obstructive pulmonary disease, unspecified: Secondary | ICD-10-CM

## 2017-08-27 DIAGNOSIS — I350 Nonrheumatic aortic (valve) stenosis: Secondary | ICD-10-CM | POA: Diagnosis not present

## 2017-08-27 DIAGNOSIS — I10 Essential (primary) hypertension: Secondary | ICD-10-CM

## 2017-08-27 DIAGNOSIS — I5032 Chronic diastolic (congestive) heart failure: Secondary | ICD-10-CM

## 2017-08-27 DIAGNOSIS — J9601 Acute respiratory failure with hypoxia: Secondary | ICD-10-CM

## 2017-08-27 DIAGNOSIS — IMO0001 Reserved for inherently not codable concepts without codable children: Secondary | ICD-10-CM

## 2017-08-27 DIAGNOSIS — F0280 Dementia in other diseases classified elsewhere without behavioral disturbance: Secondary | ICD-10-CM

## 2017-08-27 DIAGNOSIS — F0150 Vascular dementia without behavioral disturbance: Secondary | ICD-10-CM

## 2017-08-27 DIAGNOSIS — G309 Alzheimer's disease, unspecified: Secondary | ICD-10-CM | POA: Diagnosis not present

## 2017-08-27 DIAGNOSIS — I82502 Chronic embolism and thrombosis of unspecified deep veins of left lower extremity: Secondary | ICD-10-CM

## 2017-08-27 NOTE — Patient Instructions (Signed)

## 2017-08-27 NOTE — Progress Notes (Signed)
Cardiology Office Note  Date:  08/27/2017   ID:  Ariel Smith, Ariel Smith 1945-10-16, MRN [id number]  PCP:  [name], MD   Chief Complaint  Patient presents with  . OTHER    Discuss echo/aortic stenosis. Meds reviewed verbally with pt.    HPI:  72 y.o. female with h/o  end-stage renal disease on hemodialysis,  severe aortic valve stenosis,  GI bleed the beginning of August 2016,  warfarin held, hypertension,  previous DVT/PE, 09/2015,  Thrombectomy,  IVC filter, previously on warfarin COPD, chronic shortness of breath, quit in 2009 (age 5 to 13),  TAVR in early November 2016  quit smoking in 2009.  She presents for follow-up of her prosthetic aortic valve, diastolic CHF/HD dependant  In the hospital 07/2017 with Acute respiratory failure with hypoxia [facility]) Hospital records reviewed with the patient in detail Secondary to acute fluid overload Resolved with hemodialysis Receives hemodialysis typically on Mondays/Wednesdays/Fridays did receive short course of Solu-Medrol  Admission 06/2017 Same issue as above Hospital records reviewed with the patient in detail  Admission 02/2017: UTI, altered mental status Hospital records reviewed with the patient in detail  Following recent hospital admissions dry weight was dropped from 103 kg down to 100 kilograms She is also now taking torsemide 100 mg daily She reports her breathing is better, still not perfect Unable to walk very far, uses several liters nasal cannula chronically  No near syncope or syncope Occasionally has cramping at the end of dialysis Takes midodrine 5 mg 3 times a day No regular exercise program.  Recent echocardiogram results reviewed with her showing increase in peak velocity over the prosthetic aortic valve Increased from 2017  EKG personally reviewed by myself on todays visit  shows normal sinus rhythm with rate83 bpm no significant ST or T-wave changes  Other past medical history reviewed PFTs  October 2016 showing severe perfusion abnormality Chest x-ray with underlying emphysematous lung changes CT scan with scarring at the bases She wears chronic oxygen, 2 L  Echo 11/2015 showing normal LV systolic function, prosthetic valve intact  Previous hospital admissions for hypoxia, placed on BiPAP, improved with dialysis   PMH:   has a past medical history of Anemia of chronic disease, Bronchiectasis (HCC) (08/2014 ), Chronic diastolic CHF (congestive heart failure) (HCC), Chronic respiratory failure (HCC), COPD (chronic obstructive pulmonary disease) (HCC), Depression, ESRD (end stage renal disease) (HCC) (08/2011), Frequent headaches, GERD (gastroesophageal reflux disease), GIB (gastrointestinal bleeding), History of cardiac cath, History of colon polyps (2013), History of DVT of lower extremity (2009, 2017), HLD (hyperlipidemia), HTN (hypertension), Morbid obesity (HCC), Osteoarthritis, Osteopenia (01/2013), Pulmonary embolism (HCC) (2009), Secondary hyperparathyroidism of renal origin (HCC), and Severe aortic stenosis.  PSH:    Past Surgical History:  Procedure Laterality Date  . A/V FISTULAGRAM Left 07/09/2016   Procedure: A/V Fistulagram;  Surgeon: [name], MD;  Location: [facility] INVASIVE CV LAB;  Service: Cardiovascular;  Laterality: Left;  . BUNIONECTOMY Left 2003  . CARDIAC CATHETERIZATION N/A 01/18/2015   patent coronary arteries without significant osbtruction and preserved LV function, severe aortic stenosis; Procedure: Right/Left Heart Cath and Coronary Angiography;  Surgeon: Tonny Bollman, MD  . CHOLECYSTECTOMY  2012  . COLONOSCOPY  08/2011   colon biopsies, [name]  . ESOPHAGOGASTRODUODENOSCOPY  08/2011   gastric cardia polyp  . ESOPHAGOGASTRODUODENOSCOPY Left 11/11/2014   Procedure: ESOPHAGOGASTRODUODENOSCOPY (EGD);  Surgeon: [name], MD;  Location: [facility] ENDOSCOPY;  Service: Endoscopy;  Laterality: Left;  . EXTERIORIZATION OF A CONTINUOUS AMBULATORY PERITONEAL  DIALYSIS CATHETER  01/2013   removal 12/2103 - [name]  . hospitalization  12/2013   recurrent R pleural effusion due to peritoneal fluid translocation s/p rpt thoracentesis with 1.3 L fluid removed, ERSD started on HD this hospitalization  . PERIPHERAL VASCULAR CATHETERIZATION N/A 11/12/2014   Procedure: IVC Filter Insertion;  Surgeon: [name], MD;  Location: [facility] INVASIVE CV LAB;  Service: Cardiovascular;  Laterality: N/A;  . PERIPHERAL VASCULAR CATHETERIZATION Left 10/24/2015   Procedure: Lower Extremity Venography with intervention (thormbolysis/thrombectomy);  Surgeon: [name], MD;  Location: [facility] INVASIVE CV LAB;  Service: Cardiovascular;  Laterality: Left;  . REPLACEMENT TOTAL KNEE Left 2006  . SHOULDER ARTHROSCOPY Right 2009  . TEE WITHOUT CARDIOVERSION N/A 02/19/2015   Procedure: TRANSESOPHAGEAL ECHOCARDIOGRAM (TEE);  Surgeon: Tonny Bollman, MD;  Location: [facility] OR;  Service: Open Heart Surgery;  Laterality: N/A;  . TONSILLECTOMY  1955  . TRANSCATHETER AORTIC VALVE REPLACEMENT, TRANSFEMORAL Right 02/19/2015   Procedure: TRANSCATHETER AORTIC VALVE REPLACEMENT, TRANSFEMORAL;  Surgeon: Tonny Bollman, MD;  Location: [facility] OR;  Service: Open Heart Surgery;  Laterality: Right;  . TUBAL LIGATION  1980  . US ECHOCARDIOGRAPHY  12/2013   EF 55-60%, nl LV sys fxn, mild-mod MR, AS, increased LV posterior wall thickness, mild TR    Current Outpatient Medications  Medication Sig Dispense Refill  . albuterol (VENTOLIN HFA) 108 (90 BASE) MCG/ACT inhaler Inhale 2 puffs into the lungs every 4 (four) hours as needed for wheezing or shortness of breath. 18 g 3  . ALPRAZolam (NIRAVAM) 0.5 MG dissolvable tablet TAKE ONE-HALF TO ONE TABLET BY MOUTH ONCE DAILY AS NEEDED FOR ANXIETY 30 tablet 0  . calcium acetate (PHOSLO) 667 MG capsule Take 1,334 mg by mouth 3 (three) times daily with meals.     . cinacalcet (SENSIPAR) 30 MG tablet Take 30 mg by mouth daily.    . clopidogrel (PLAVIX) 75 MG tablet TAKE 1  TABLET BY MOUTH ONCE DAILY WITH BREAKFAST 90 tablet 0  . diphenhydrAMINE (BENADRYL) 25 MG tablet Take 25 mg by mouth every 6 (six) hours as needed for allergies.     Marland Kitchen ELIQUIS 2.5 MG TABS tablet TAKE 1 TABLET BY MOUTH TWICE DAILY 60 tablet 6  . famotidine (PEPCID) 40 MG tablet TAKE 1 TABLET BY MOUTH IN THE EVENING 30 tablet 5  . guaiFENesin (MUCINEX) 600 MG 12 hr tablet Take 1 tablet (600 mg total) by mouth 2 (two) times daily as needed for cough. 30 tablet 0  . ipratropium-albuterol (DUONEB) 0.5-2.5 (3) MG/3ML SOLN Take 3 mLs by nebulization every 6 (six) hours as needed (shortness of breath/ wheezing). 360 mL 1  . lidocaine-prilocaine (EMLA) cream Apply 1 application topically as needed (topical anesthesia for hemodialysis if Gebauers and Lidocaine injection are ineffective.). 30 g 0  . loratadine (CLARITIN) 10 MG tablet Take 1 tablet (10 mg total) by mouth daily. 30 tablet 0  . midodrine (PROAMATINE) 5 MG tablet Take 1 tablet (5 mg total) by mouth 3 (three) times daily with meals. 90 tablet 0  . Multiple Vitamin (MULTIVITAMIN WITH MINERALS) TABS tablet Take 1 tablet by mouth daily.    . sertraline (ZOLOFT) 50 MG tablet Take 1 tablet (50 mg total) by mouth daily. 30 tablet 6  . torsemide (DEMADEX) 100 MG tablet Take 1 tablet (100 mg total) by mouth daily. 30 tablet 0  . traMADol (ULTRAM) 50 MG tablet Take 0.5-1 tablets (25-50 mg total) by mouth 2 (two) times daily as needed. (Patient taking differently:  Take 25-50 mg by mouth 2 (two) times daily as needed for moderate pain. ) 50 tablet 0  . traZODone (DESYREL) 50 MG tablet Take 100 mg by mouth at bedtime.     No current facility-administered medications for this visit.      Allergies:   Nodolor [isometheptene-dichloral-apap]   Social History:  The patient  reports that she quit smoking about 10 years ago. Her smoking use included cigarettes. She started smoking about 39 years ago. She has a 60.00 pack-year smoking history. She has never used  smokeless tobacco. She reports that she does not drink alcohol or use drugs.   Family History:   family history includes Breast cancer (age of onset: 27) in her daughter; Cancer in her father; Cancer (age of onset: 32) in her mother; Deep vein thrombosis in her brother; Dementia (age of onset: 64) in her father; Hypertension in her brother and father; Stroke in her mother.    Review of Systems: Review of Systems  Constitutional: Negative.   Respiratory: Positive for shortness of breath.   Cardiovascular: Negative.   Gastrointestinal: Negative.   Musculoskeletal: Negative.   Neurological: Negative.   Psychiatric/Behavioral: Negative.   All other systems reviewed and are negative.    PHYSICAL EXAM: VS:  BP 104/60 (BP Location: Right Arm, Patient Position: Sitting, Cuff Size: Large)   Pulse 83   Ht 5\' 4"  (1.626 m)   Wt 223 lb 4 oz (101.3 kg)   BMI 38.32 kg/m  , BMI Body mass index is 38.32 kg/m. Constitutional:  oriented to person, place, and time. No distress.presenting in a wheelchair HENT:  Head: Normocephalic and atraumatic.  Eyes:  no discharge. No scleral icterus.  Neck: Normal range of motion. Neck supple. No JVD present.  Cardiovascular: Normal rate, regular rhythm, normal heart sounds and intact distal pulses. Exam reveals no gallop and no friction rub. No edema 2/6 SEM RSB Pulmonary/Chest: Effort normal and breath sounds normal. No stridor. No respiratory distress.  no wheezes.  no rales.  no tenderness.  Abdominal: Soft.  no distension.  no tenderness.  Musculoskeletal: Normal range of motion.  no  tenderness or deformity.  Neurological:  normal muscle tone. Coordination normal. No atrophy Skin: Skin is warm and dry. No rash noted. not diaphoretic.  Psychiatric:  normal mood and affect. behavior is normal. Thought content normal.      Recent Labs: 07/28/2017: ALT 13; B Natriuretic Peptide 704.0; BUN 85; Creatinine, Ser 11.05; Hemoglobin 9.5; Platelets 99; Potassium  4.5; Sodium 140; TSH 2.146    Lipid Panel Lab Results  Component Value Date   CHOL 149 07/10/2015   HDL 34.30 (L) 07/10/2015   LDLCALC 91 07/10/2015   TRIG 116.0 07/10/2015      Wt Readings from Last 3 Encounters:  08/27/17 223 lb 4 oz (101.3 kg)  08/02/17 222 lb (100.7 kg)  07/30/17 222 lb 4.8 oz (100.8 kg)       ASSESSMENT AND PLAN:  Chronic diastolic CHF (congestive heart failure) (HCC) - Plan: EKG 12-Lead Recent decrease in dry weight, 100 kg Torsemide 100 daily Denies excessive fluid intake, denies excessive salt intake Discuss IV Lasix for emergencies through same-day surgery  Essential hypertension Blood pressure is well controlled on today's visit. No changes made to the medications. 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, unspecified laterality, unspecified vein (HCC)  on anticoagulation eliquis 2.5 BID IVC filter in place   S/P  TAVR (transcatheter aortic valve replacement) Valve with increasing velocity and gradient on most recent echocardiogram warranting close monitoring Family does not want repeat echocardiogram in 6 months prefers clinical exam, evaluation of murmur, long discussion concerning valve, parameters, potential possibilities if valve continued to get worse  End-stage renal disease On hemodialysis 3 days per week Long discussion concerning maintaining euvolemic state Recent lowering,  Of dry weight on torsemide 100 daily   chronic respiratory distress 30 years of smoking/emphysema, morbid obesity.  Chronic diastolic CHF,  Several recent hospital admissions  Long discussion with patient, significant review of recent hospitalizations  Total encounter time more than 45 minutes  Greater than 50% was spent in counseling and coordination of care with the patient   Disposition:   F/U  6 months   No orders of the defined types were placed in this  encounter.    Signed, Dossie Arbour, M.D., Ph.D. 08/27/2017  [facility] [place], [place] [phone]

## 2017-09-08 ENCOUNTER — Other Ambulatory Visit: Payer: Self-pay

## 2017-09-08 NOTE — Telephone Encounter (Signed)
I was not sure if this needed to be refilled or not.  Last (looks like only) rx:  07/31/17, #30 Last OV:  07/20/17 Next OV:  09/17/17

## 2017-09-09 ENCOUNTER — Other Ambulatory Visit: Payer: Medicare Other

## 2017-09-09 ENCOUNTER — Other Ambulatory Visit: Payer: Self-pay | Admitting: Family Medicine

## 2017-09-09 DIAGNOSIS — E782 Mixed hyperlipidemia: Secondary | ICD-10-CM

## 2017-09-09 DIAGNOSIS — E559 Vitamin D deficiency, unspecified: Secondary | ICD-10-CM

## 2017-09-09 DIAGNOSIS — E538 Deficiency of other specified B group vitamins: Secondary | ICD-10-CM

## 2017-09-09 DIAGNOSIS — N186 End stage renal disease: Secondary | ICD-10-CM

## 2017-09-09 MED ORDER — TORSEMIDE 100 MG PO TABS: 100.0000 mg | ORAL_TABLET | Freq: Every day | ORAL | 6 refills | Status: AC

## 2017-09-10 ENCOUNTER — Other Ambulatory Visit: Payer: Medicare Other

## 2017-09-14 ENCOUNTER — Other Ambulatory Visit (INDEPENDENT_AMBULATORY_CARE_PROVIDER_SITE_OTHER): Payer: Medicare Other

## 2017-09-14 ENCOUNTER — Telehealth: Payer: Self-pay | Admitting: Family Medicine

## 2017-09-14 DIAGNOSIS — E782 Mixed hyperlipidemia: Secondary | ICD-10-CM | POA: Diagnosis not present

## 2017-09-14 DIAGNOSIS — E538 Deficiency of other specified B group vitamins: Secondary | ICD-10-CM

## 2017-09-14 DIAGNOSIS — E559 Vitamin D deficiency, unspecified: Secondary | ICD-10-CM | POA: Diagnosis not present

## 2017-09-14 DIAGNOSIS — N186 End stage renal disease: Secondary | ICD-10-CM | POA: Diagnosis not present

## 2017-09-14 LAB — COMPREHENSIVE METABOLIC PANEL
ALK PHOS: 108 U/L (ref 39–117)
ALT: 8 U/L (ref 0–35)
AST: 13 U/L (ref 0–37)
Albumin: 3.9 g/dL (ref 3.5–5.2)
BUN: 30 mg/dL — AB (ref 6–23)
CHLORIDE: 100 meq/L (ref 96–112)
CO2: 29 mEq/L (ref 19–32)
CREATININE: 6.27 mg/dL — AB (ref 0.40–1.20)
Calcium: 8.9 mg/dL (ref 8.4–10.5)
GFR: 6.98 mL/min — CL (ref >60.00)
GLUCOSE: 98 mg/dL (ref 70–99)
POTASSIUM: 4.5 meq/L (ref 3.5–5.1)
SODIUM: 142 meq/L (ref 135–145)
TOTAL PROTEIN: 6.9 g/dL (ref 6.0–8.3)
Total Bilirubin: 0.4 mg/dL (ref 0.2–1.2)

## 2017-09-14 LAB — CBC WITH DIFFERENTIAL/PLATELET
Basophils Absolute: 0 10*3/uL (ref 0.0–0.1)
Basophils Relative: 0.7 % (ref 0.0–3.0)
EOS PCT: 2.1 % (ref 0.0–5.0)
Eosinophils Absolute: 0.1 10*3/uL (ref 0.0–0.7)
HCT: 29.7 % — ABNORMAL LOW (ref 36.0–46.0)
Hemoglobin: 9.9 g/dL — ABNORMAL LOW (ref 12.0–15.0)
LYMPHS ABS: 0.9 10*3/uL (ref 0.7–4.0)
Lymphocytes Relative: 14 % (ref 12.0–46.0)
MCHC: 33.1 g/dL (ref 30.0–36.0)
MCV: 107.6 fl — ABNORMAL HIGH (ref 78.0–100.0)
MONO ABS: 0.6 10*3/uL (ref 0.1–1.0)
Monocytes Relative: 8.8 % (ref 3.0–12.0)
NEUTROS PCT: 74.4 % (ref 43.0–77.0)
Neutro Abs: 4.7 10*3/uL (ref 1.4–7.7)
Platelets: 185 10*3/uL (ref 150.0–400.0)
RBC: 2.76 Mil/uL — AB (ref 3.87–5.11)
RDW: 16.3 % — ABNORMAL HIGH (ref 11.5–15.5)
WBC: 6.3 10*3/uL (ref 4.0–10.5)

## 2017-09-14 LAB — LIPID PANEL
CHOL/HDL RATIO: 6
Cholesterol: 190 mg/dL (ref 0–200)
HDL: 33.8 mg/dL — ABNORMAL LOW (ref >39.00)
NonHDL: 156.2
Triglycerides: 209 mg/dL — ABNORMAL HIGH (ref 0.0–149.0)
VLDL: 41.8 mg/dL — AB (ref 0.0–40.0)

## 2017-09-14 LAB — VITAMIN D 25 HYDROXY (VIT D DEFICIENCY, FRACTURES): VITD: 34.05 ng/mL (ref 30.00–100.00)

## 2017-09-14 LAB — VITAMIN B12: VITAMIN B 12: 308 pg/mL (ref 211–911)

## 2017-09-14 LAB — LDL CHOLESTEROL, DIRECT: Direct LDL: 109 mg/dL

## 2017-09-14 NOTE — Telephone Encounter (Signed)
Known ESRD on HD

## 2017-09-14 NOTE — Telephone Encounter (Signed)
[facility] called with critical lab results. GFR 6.98 Creatinine 6.27 Results given to Dr.Gutierrez at 12:10pm -Wendi Maya, RTR

## 2017-09-17 ENCOUNTER — Ambulatory Visit (INDEPENDENT_AMBULATORY_CARE_PROVIDER_SITE_OTHER): Payer: Medicare Other | Admitting: Family Medicine

## 2017-09-17 ENCOUNTER — Encounter: Payer: Self-pay | Admitting: Family Medicine

## 2017-09-17 VITALS — BP 124/80 | HR 85 | Temp 98.0°F | Ht 63.0 in | Wt 225.5 lb

## 2017-09-17 DIAGNOSIS — H9193 Unspecified hearing loss, bilateral: Secondary | ICD-10-CM

## 2017-09-17 DIAGNOSIS — Z7189 Other specified counseling: Secondary | ICD-10-CM

## 2017-09-17 DIAGNOSIS — N6320 Unspecified lump in the left breast, unspecified quadrant: Secondary | ICD-10-CM

## 2017-09-17 DIAGNOSIS — F331 Major depressive disorder, recurrent, moderate: Secondary | ICD-10-CM

## 2017-09-17 DIAGNOSIS — Z0001 Encounter for general adult medical examination with abnormal findings: Secondary | ICD-10-CM

## 2017-09-17 DIAGNOSIS — I82502 Chronic embolism and thrombosis of unspecified deep veins of left lower extremity: Secondary | ICD-10-CM

## 2017-09-17 DIAGNOSIS — G4700 Insomnia, unspecified: Secondary | ICD-10-CM | POA: Diagnosis not present

## 2017-09-17 DIAGNOSIS — I350 Nonrheumatic aortic (valve) stenosis: Secondary | ICD-10-CM | POA: Diagnosis not present

## 2017-09-17 DIAGNOSIS — M8580 Other specified disorders of bone density and structure, unspecified site: Secondary | ICD-10-CM | POA: Diagnosis not present

## 2017-09-17 DIAGNOSIS — I10 Essential (primary) hypertension: Secondary | ICD-10-CM

## 2017-09-17 DIAGNOSIS — E782 Mixed hyperlipidemia: Secondary | ICD-10-CM

## 2017-09-17 DIAGNOSIS — N186 End stage renal disease: Secondary | ICD-10-CM | POA: Diagnosis not present

## 2017-09-17 DIAGNOSIS — E559 Vitamin D deficiency, unspecified: Secondary | ICD-10-CM

## 2017-09-17 DIAGNOSIS — Z8673 Personal history of transient ischemic attack (TIA), and cerebral infarction without residual deficits: Secondary | ICD-10-CM

## 2017-09-17 DIAGNOSIS — IMO0001 Reserved for inherently not codable concepts without codable children: Secondary | ICD-10-CM

## 2017-09-17 DIAGNOSIS — E538 Deficiency of other specified B group vitamins: Secondary | ICD-10-CM | POA: Diagnosis not present

## 2017-09-17 MED ORDER — B-12 500 MCG PO TABS: 500.0000 ug | ORAL_TABLET | Freq: Every day | ORAL | Status: AC

## 2017-09-17 NOTE — Assessment & Plan Note (Signed)
Continue low dose eliquis.  

## 2017-09-17 NOTE — Assessment & Plan Note (Signed)
Advanced directives - older daughter is HCPOA. Will bring us copy which daughter has at home. 

## 2017-09-17 NOTE — Patient Instructions (Addendum)
Make sure you're still taking atorvastatin 40mg  at night time.  Start vitamin b12 (cyanocobalamin) once daily.  We will schedule you for mammogram and ultrasound as well as bone density scan.  Return to see me in 3 months.

## 2017-09-17 NOTE — Assessment & Plan Note (Signed)
Hearing aides unaffordable.

## 2017-09-17 NOTE — Assessment & Plan Note (Signed)
Stable period. Continue sertraline 50mg  daily, trazodone 100mg  nightly.

## 2017-09-17 NOTE — Assessment & Plan Note (Signed)
Encouraged she start b12 orally.

## 2017-09-17 NOTE — Assessment & Plan Note (Signed)
Vit d levels normal this year.

## 2017-09-17 NOTE — Assessment & Plan Note (Signed)
Appreciate renal care. Continue HD MWF.

## 2017-09-17 NOTE — Assessment & Plan Note (Addendum)
Vit D level normal. Not performing weight bearing exercise. Will update dexa at this time.

## 2017-09-17 NOTE — Progress Notes (Signed)
BP 124/80 (BP Location: Left Arm, Patient Position: Sitting, Cuff Size: Large)   Pulse 85   Temp 98 F (36.7 C) (Oral)   Ht 5\' 3"  (1.6 m)   Wt 225 lb 8 oz (102.3 kg)   SpO2 97% Comment: 3 L, pulsating  BMI 39.95 kg/m    CC: AMW Subjective:    Patient ID: Ariel Smith, female    DOB: 1945/11/20, 72 y.o.   MRN: [id number]  HPI: Ariel Smith is a 72 y.o. female presenting on 09/17/2017 for Medicare Wellness (Pt accompained by grandson.)   Here with grandson who keeps pt's daughter on the phone for the majority of the visit.  Saw Virl Axe 05/2017 for medicare wellness visit. Note reviewed.   Some incontinence. Constipation managed with stool softeners.    Hearing Screening   125Hz  250Hz  500Hz  1000Hz  2000Hz  3000Hz  4000Hz  6000Hz  8000Hz   Right ear:   0 0 40  0    Left ear:   0 0 0  0    Comments: Heard practice tone in left ear  Vision Screening Comments: Last eye exam, 01/2017 No falls this past year Passes depression screen  Preventative: COLONOSCOPY Date: 08/2011 colon biopsies, [name]. Denies bowel changes or blood in stool.  Mammogram - doesn't do breast exams at home. Last per our records 02/2016 s/p L breast biopsy 04/2016 - however initial mass was not biopsied and she was recommended to return for rpt biopsy - pt states she did not return at that time. However she is interested in scheduling repeat imaging today and then will decide if she wants to proceed with biopsy if needed.  Well woman exam - normal pap 2015. Menopause at age 53. Denies vaginal bleeding. Declines pelvic exam today.  DEXA Date: 01/2013 osteopenia with -2.0 at hip and spine Flu shot - yearly at dialysis prevnar 2017, pneumovax 2013, 2018 shingrix - declines.  Advanced directives - older daughter is HCPOA. Will bring [place] copy which daughter has at home. Seat belt use discussed Sunscreen use discussed. No changing moles.  Ex smoker quit 2009 Alcohol - none Dentist - has not seen regularly Eye  exam - this year at [facility]  Lives with husband.  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 Interim medical history since our last visit reviewed. Allergies and medications reviewed and updated. Outpatient Medications Prior to Visit  Medication Sig Dispense Refill  . albuterol (VENTOLIN HFA) 108 (90 BASE) MCG/ACT inhaler Inhale 2 puffs into the lungs every 4 (four) hours as needed for wheezing or shortness of breath. 18 g 3  . ALPRAZolam (NIRAVAM) 0.5 MG dissolvable tablet TAKE ONE-HALF TO ONE TABLET BY MOUTH ONCE DAILY AS NEEDED FOR ANXIETY 30 tablet 0  . calcium acetate (PHOSLO) 667 MG capsule Take 1,334 mg by mouth 3 (three) times daily with meals.     . cinacalcet (SENSIPAR) 30 MG tablet Take 30 mg by mouth daily.    . clopidogrel (PLAVIX) 75 MG tablet TAKE 1 TABLET BY MOUTH ONCE DAILY WITH BREAKFAST 90 tablet 0  . diphenhydrAMINE (BENADRYL) 25 MG tablet Take 25 mg by mouth every 6 (six) hours as needed for allergies.     Marland Kitchen ELIQUIS 2.5 MG TABS tablet TAKE 1 TABLET BY MOUTH TWICE DAILY 60 tablet 6  . famotidine (PEPCID) 40  MG tablet TAKE 1 TABLET BY MOUTH IN THE EVENING 30 tablet 5  . guaiFENesin (MUCINEX) 600 MG 12 hr tablet Take 1 tablet (600 mg total) by mouth 2 (two) times daily as needed for cough. 30 tablet 0  . ipratropium-albuterol (DUONEB) 0.5-2.5 (3) MG/3ML SOLN Take 3 mLs by nebulization every 6 (six) hours as needed (shortness of breath/ wheezing). 360 mL 1  . lidocaine-prilocaine (EMLA) cream Apply 1 application topically as needed (topical anesthesia for hemodialysis if Gebauers and Lidocaine injection are ineffective.). 30 g 0  . loratadine (CLARITIN) 10 MG tablet Take 1 tablet (10 mg total) by mouth daily. 30 tablet 0  . midodrine (PROAMATINE) 5 MG tablet Take 1 tablet  (5 mg total) by mouth 3 (three) times daily with meals. 90 tablet 0  . Multiple Vitamin (MULTIVITAMIN WITH MINERALS) TABS tablet Take 1 tablet by mouth daily.    . sertraline (ZOLOFT) 50 MG tablet Take 1 tablet (50 mg total) by mouth daily. 30 tablet 6  . torsemide (DEMADEX) 100 MG tablet Take 1 tablet (100 mg total) by mouth daily. 30 tablet 6  . traMADol (ULTRAM) 50 MG tablet Take 0.5-1 tablets (25-50 mg total) by mouth 2 (two) times daily as needed. (Patient taking differently: Take 25-50 mg by mouth 2 (two) times daily as needed for moderate pain. ) 50 tablet 0  . traZODone (DESYREL) 50 MG tablet Take 100 mg by mouth at bedtime.    Marland Kitchen atorvastatin (LIPITOR) 40 MG tablet Take 1 tablet (40 mg total) by mouth daily.     No facility-administered medications prior to visit.      Per HPI unless specifically indicated in ROS section below Review of Systems  Constitutional: Negative for activity change, appetite change, chills, fatigue, fever and unexpected weight change.  HENT: Negative for hearing loss.   Eyes: Negative for visual disturbance.  Respiratory: Positive for cough and shortness of breath (exertional). Negative for chest tightness and wheezing.   Cardiovascular: Negative for chest pain, palpitations and leg swelling.  Gastrointestinal: Negative for abdominal distention, abdominal pain, blood in stool, constipation, diarrhea, nausea and vomiting.  Genitourinary: Negative for difficulty urinating and hematuria.  Musculoskeletal: Negative for arthralgias, myalgias and neck pain.  Skin: Negative for rash.  Neurological: Positive for dizziness and headaches. Negative for seizures and syncope.  Hematological: Negative for adenopathy. Bruises/bleeds easily.  Psychiatric/Behavioral: Negative for dysphoric mood. The patient is not nervous/anxious.        Objective:    BP 124/80 (BP Location: Left Arm, Patient Position: Sitting, Cuff Size: Large)   Pulse 85   Temp 98 F (36.7 C) (Oral)    Ht 5\' 3"  (1.6 m)   Wt 225 lb 8 oz (102.3 kg)   SpO2 97% Comment: 3 L, pulsating  BMI 39.95 kg/m   Wt Readings from Last 3 Encounters:  09/17/17 225 lb 8 oz (102.3 kg)  08/27/17 223 lb 4 oz (101.3 kg)  08/02/17 222 lb (100.7 kg)    Physical Exam  Constitutional: She is oriented to person, place, and time. She appears well-developed and well-nourished. No distress.  Walks with cane  HENT:  Head: Normocephalic and atraumatic.  Right Ear: Hearing, tympanic membrane and ear canal normal.  Left Ear: Hearing, tympanic membrane and ear canal normal.  Mouth/Throat: Uvula is midline, oropharynx is clear and moist and mucous membranes are normal. No oropharyngeal exudate, posterior oropharyngeal edema or posterior oropharyngeal erythema.  Eyes: Pupils are equal, round, and reactive to light. Conjunctivae and EOM  are normal. No scleral icterus.  Neck: Normal range of motion. Neck supple.  Cardiovascular: Normal rate, regular rhythm and intact distal pulses.  Murmur heard. Pulses:      Radial pulses are 2+ on the right side, and 2+ on the left side.  Pulmonary/Chest: Effort normal and breath sounds normal. No respiratory distress. She has no wheezes. She has no rales. Right breast exhibits no inverted nipple, no mass, no nipple discharge, no skin change and no tenderness. Left breast exhibits no inverted nipple, no mass, no nipple discharge, no skin change and no tenderness. No breast discharge.  No obvious mass appreciated  Abdominal: Soft. Bowel sounds are normal. She exhibits no distension and no mass. There is no tenderness. There is no rebound and no guarding.  Musculoskeletal: Normal range of motion. She exhibits no edema.  Lymphadenopathy:       Head (right side): No submental, no submandibular, no tonsillar, no preauricular and no posterior auricular adenopathy present.       Head (left side): No submental, no submandibular, no tonsillar, no preauricular and no posterior auricular  adenopathy present.    She has no cervical adenopathy.    She has no axillary adenopathy.       Right axillary: No lateral adenopathy present.       Left axillary: No lateral adenopathy present.      Right: No supraclavicular adenopathy present.       Left: No supraclavicular adenopathy present.  Neurological: She is alert and oriented to person, place, and time.  CN grossly intact, station and gait intact  Skin: Skin is warm and dry. No rash noted.  Psychiatric: She has a normal mood and affect. Her behavior is normal.  Nursing note and vitals reviewed.  Results for orders placed or performed in visit on 09/14/17  CBC with Differential/Platelet  Result Value Ref Range   WBC 6.3 4.0 - 10.5 K/uL   RBC 2.76 (L) 3.87 - 5.11 Mil/uL   Hemoglobin 9.9 (L) 12.0 - 15.0 g/dL   HCT 16.1 (L) 09.6 - 04.5 %   MCV 107.6 (H) 78.0 - 100.0 fl   MCHC 33.1 30.0 - 36.0 g/dL   RDW 40.9 (H) 81.1 - 91.4 %   Platelets 185.0 150.0 - 400.0 K/uL   Neutrophils Relative % 74.4 43.0 - 77.0 %   Lymphocytes Relative 14.0 12.0 - 46.0 %   Monocytes Relative 8.8 3.0 - 12.0 %   Eosinophils Relative 2.1 0.0 - 5.0 %   Basophils Relative 0.7 0.0 - 3.0 %   Neutro Abs 4.7 1.4 - 7.7 K/uL   Lymphs Abs 0.9 0.7 - 4.0 K/uL   Monocytes Absolute 0.6 0.1 - 1.0 K/uL   Eosinophils Absolute 0.1 0.0 - 0.7 K/uL   Basophils Absolute 0.0 0.0 - 0.1 K/uL  Comprehensive metabolic panel  Result Value Ref Range   Sodium 142 135 - 145 mEq/L   Potassium 4.5 3.5 - 5.1 mEq/L   Chloride 100 96 - 112 mEq/L   CO2 29 19 - 32 mEq/L   Glucose, Bld 98 70 - 99 mg/dL   BUN 30 (H) 6 - 23 mg/dL   Creatinine, Ser 7.82 (HH) 0.40 - 1.20 mg/dL   Total Bilirubin 0.4 0.2 - 1.2 mg/dL   Alkaline Phosphatase 108 39 - 117 U/L   AST 13 0 - 37 U/L   ALT 8 0 - 35 U/L   Total Protein 6.9 6.0 - 8.3 g/dL   Albumin 3.9 3.5 - 5.2  g/dL   Calcium 8.9 8.4 - 72.0 mg/dL   GFR 9.47 (LL) >09.62 mL/min  Lipid panel  Result Value Ref Range   Cholesterol 190 0 - 200  mg/dL   Triglycerides 836.6 (H) 0.0 - 149.0 mg/dL   HDL 29.47 (L) >65.46 mg/dL   VLDL 50.3 (H) 0.0 - 54.6 mg/dL   Total CHOL/HDL Ratio 6    NonHDL 156.20   VITAMIN D 25 Hydroxy (Vit-D Deficiency, Fractures)  Result Value Ref Range   VITD 34.05 30.00 - 100.00 ng/mL  Vitamin B12  Result Value Ref Range   Vitamin B-12 308 211 - 911 pg/mL  LDL cholesterol, direct  Result Value Ref Range   Direct LDL 109.0 mg/dL      Assessment & Plan:   Problem List Items Addressed This Visit    Advanced care planning/counseling discussion    Advanced directives - older daughter is HCPOA. Will bring [place] copy which daughter has at home.      Aortic valvar stenosis   Relevant Medications   atorvastatin (LIPITOR) 40 MG tablet   Bilateral hearing loss    Hearing aides unaffordable.      Breast mass, left    H/o this. Will update imaging and [place]. Pt unsure if she would proceed with biopsy but agrees to start with imaging.       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       Relevant Medications   atorvastatin (LIPITOR) 40 MG tablet   Encounter for general adult medical examination with abnormal findings - Primary    Preventative protocols reviewed and updated unless pt declined. Discussed healthy diet and lifestyle.       End stage renal disease (HCC) (Chronic)    Appreciate renal care. Continue HD MWF.       History of ischemic right MCA stroke    Continue plavix.       Relevant Medications   atorvastatin (LIPITOR) 40 MG tablet   HLD (hyperlipidemia) (Chronic)    Atorvastatin has fallen off her med list unclear reason. Family thinks she is taking medication I asked them to verify at home today.       Relevant Medications   atorvastatin (LIPITOR) 40 MG tablet   HTN (hypertension) (Chronic)    Chronic, stable. Continue current  regimen.      Relevant Medications   atorvastatin (LIPITOR) 40 MG tablet   Insomnia    Continue trazodone 100mg  nightly.       MDD (major depressive disorder), recurrent episode, moderate (HCC)    Stable period. Continue sertraline 50mg  daily, trazodone 100mg  nightly.       Osteopenia    Vit D level normal. Not performing weight bearing exercise. Will update dexa at this time.       Relevant Orders   DG Bone Density   Severe obesity (BMI 35.0-39.9) with comorbidity (HCC)    No regular exercise.       Vitamin B12 deficiency    Encouraged she start b12 orally.       Vitamin D deficiency    Vit d levels normal this year.           Meds ordered this encounter  Medications  . Cyanocobalamin (B-12) 500 MCG TABS    Sig: Take 500 mcg by mouth daily.  Dispense:  30 tablet   Orders Placed This Encounter  Procedures  . DG Bone Density    Standing Status:   Future    Standing Expiration Date:   11/18/2018    Order Specific Question:   Reason for Exam (SYMPTOM  OR DIAGNOSIS REQUIRED)    Answer:   f/u osteopenia    Order Specific Question:   Preferred imaging location?    Answer:   [facility]  . MM Digital Diagnostic Bilat    Standing Status:   Future    Standing Expiration Date:   11/18/2018    Order Specific Question:   Reason for Exam (SYMPTOM  OR DIAGNOSIS REQUIRED)    Answer:   screening f/u abnormal L breast mammo/US    Order Specific Question:   Preferred imaging location?    Answer:   [facility]  . US BREAST LTD UNI LEFT INC AXILLA    Standing Status:   Future    Standing Expiration Date:   11/18/2018    Order Specific Question:   Reason for Exam (SYMPTOM  OR DIAGNOSIS REQUIRED)    Answer:   f/u abnormal mammo/ [place]    Order Specific Question:   Preferred imaging location?    Answer:   [facility]    Follow up plan: Return in about 3 months (around 12/18/2017) for follow up visit.  [name], MD

## 2017-09-17 NOTE — Assessment & Plan Note (Signed)
Chronic, stable. Continue current regimen. 

## 2017-09-17 NOTE — Assessment & Plan Note (Signed)
Continue plavix °

## 2017-09-17 NOTE — Assessment & Plan Note (Signed)
Atorvastatin has fallen off her med list unclear reason. Family thinks she is taking medication I asked them to verify at home today.

## 2017-09-17 NOTE — Assessment & Plan Note (Signed)
Continue trazodone 100 mg nightly.

## 2017-09-17 NOTE — Assessment & Plan Note (Signed)
Preventative protocols reviewed and updated unless pt declined. Discussed healthy diet and lifestyle.  

## 2017-09-17 NOTE — Assessment & Plan Note (Signed)
No regular exercise 

## 2017-09-17 NOTE — Assessment & Plan Note (Signed)
H/o this. Will update imaging and [place]. Pt unsure if she would proceed with biopsy but agrees to start with imaging.

## 2017-09-20 ENCOUNTER — Other Ambulatory Visit: Payer: Self-pay | Admitting: Family Medicine

## 2017-09-20 NOTE — Telephone Encounter (Signed)
Eprescribed.

## 2017-09-27 ENCOUNTER — Telehealth: Payer: Self-pay

## 2017-09-27 DIAGNOSIS — E782 Mixed hyperlipidemia: Secondary | ICD-10-CM

## 2017-09-27 MED ORDER — ROSUVASTATIN CALCIUM 10 MG PO TABS: 10.0000 mg | ORAL_TABLET | Freq: Every day | ORAL | 3 refills | Status: AC

## 2017-09-27 NOTE — Telephone Encounter (Signed)
Left message on vm per dpr relaying Dr. G's message.  

## 2017-09-27 NOTE — Telephone Encounter (Signed)
Ok -let's try crestor (rosuvastatin) in place of atorvastatin. New medicine sent to pharmacy.

## 2017-09-27 NOTE — Telephone Encounter (Signed)
Copied from CRM [id number]. Topic: General - Other >> Sep 27, 2017  1:01 PM Percival Spanish wrote:  Pt call to say the cholestrol medicine that she was put on is causing her to cough and is asking for something else.She did not know the name of the medicine  Pharmacy [facility] [address]

## 2017-09-28 ENCOUNTER — Encounter: Payer: Self-pay | Admitting: Family Medicine

## 2017-10-04 ENCOUNTER — Emergency Department: Payer: Medicare Other

## 2017-10-04 DIAGNOSIS — D631 Anemia in chronic kidney disease: Secondary | ICD-10-CM | POA: Diagnosis present

## 2017-10-04 DIAGNOSIS — N186 End stage renal disease: Secondary | ICD-10-CM | POA: Diagnosis present

## 2017-10-04 DIAGNOSIS — I462 Cardiac arrest due to underlying cardiac condition: Secondary | ICD-10-CM | POA: Diagnosis not present

## 2017-10-04 DIAGNOSIS — J9690 Respiratory failure, unspecified, unspecified whether with hypoxia or hypercapnia: Secondary | ICD-10-CM

## 2017-10-04 DIAGNOSIS — R9431 Abnormal electrocardiogram [ECG] [EKG]: Secondary | ICD-10-CM | POA: Diagnosis present

## 2017-10-04 DIAGNOSIS — Z952 Presence of prosthetic heart valve: Secondary | ICD-10-CM

## 2017-10-04 DIAGNOSIS — J9621 Acute and chronic respiratory failure with hypoxia: Secondary | ICD-10-CM | POA: Diagnosis present

## 2017-10-04 DIAGNOSIS — K219 Gastro-esophageal reflux disease without esophagitis: Secondary | ICD-10-CM | POA: Diagnosis present

## 2017-10-04 DIAGNOSIS — I132 Hypertensive heart and chronic kidney disease with heart failure and with stage 5 chronic kidney disease, or end stage renal disease: Principal | ICD-10-CM | POA: Diagnosis present

## 2017-10-04 DIAGNOSIS — N2581 Secondary hyperparathyroidism of renal origin: Secondary | ICD-10-CM | POA: Diagnosis present

## 2017-10-04 DIAGNOSIS — I219 Acute myocardial infarction, unspecified: Secondary | ICD-10-CM | POA: Diagnosis present

## 2017-10-04 DIAGNOSIS — F419 Anxiety disorder, unspecified: Secondary | ICD-10-CM | POA: Diagnosis present

## 2017-10-04 DIAGNOSIS — Z66 Do not resuscitate: Secondary | ICD-10-CM | POA: Diagnosis present

## 2017-10-04 DIAGNOSIS — Z86711 Personal history of pulmonary embolism: Secondary | ICD-10-CM

## 2017-10-04 DIAGNOSIS — G8929 Other chronic pain: Secondary | ICD-10-CM | POA: Diagnosis present

## 2017-10-04 DIAGNOSIS — Z7902 Long term (current) use of antithrombotics/antiplatelets: Secondary | ICD-10-CM

## 2017-10-04 DIAGNOSIS — I10 Essential (primary) hypertension: Secondary | ICD-10-CM | POA: Diagnosis present

## 2017-10-04 DIAGNOSIS — Z8249 Family history of ischemic heart disease and other diseases of the circulatory system: Secondary | ICD-10-CM | POA: Diagnosis not present

## 2017-10-04 DIAGNOSIS — I361 Nonrheumatic tricuspid (valve) insufficiency: Secondary | ICD-10-CM | POA: Diagnosis not present

## 2017-10-04 DIAGNOSIS — Z87891 Personal history of nicotine dependence: Secondary | ICD-10-CM

## 2017-10-04 DIAGNOSIS — Z96652 Presence of left artificial knee joint: Secondary | ICD-10-CM | POA: Diagnosis present

## 2017-10-04 DIAGNOSIS — I959 Hypotension, unspecified: Secondary | ICD-10-CM | POA: Diagnosis present

## 2017-10-04 DIAGNOSIS — M549 Dorsalgia, unspecified: Secondary | ICD-10-CM | POA: Diagnosis present

## 2017-10-04 DIAGNOSIS — Z8673 Personal history of transient ischemic attack (TIA), and cerebral infarction without residual deficits: Secondary | ICD-10-CM

## 2017-10-04 DIAGNOSIS — Z7951 Long term (current) use of inhaled steroids: Secondary | ICD-10-CM | POA: Diagnosis not present

## 2017-10-04 DIAGNOSIS — Z86718 Personal history of other venous thrombosis and embolism: Secondary | ICD-10-CM

## 2017-10-04 DIAGNOSIS — Z992 Dependence on renal dialysis: Secondary | ICD-10-CM | POA: Diagnosis not present

## 2017-10-04 DIAGNOSIS — E785 Hyperlipidemia, unspecified: Secondary | ICD-10-CM | POA: Diagnosis present

## 2017-10-04 DIAGNOSIS — Z95828 Presence of other vascular implants and grafts: Secondary | ICD-10-CM | POA: Diagnosis not present

## 2017-10-04 DIAGNOSIS — R579 Shock, unspecified: Secondary | ICD-10-CM | POA: Diagnosis not present

## 2017-10-04 DIAGNOSIS — Z79899 Other long term (current) drug therapy: Secondary | ICD-10-CM

## 2017-10-04 DIAGNOSIS — Z9049 Acquired absence of other specified parts of digestive tract: Secondary | ICD-10-CM

## 2017-10-04 DIAGNOSIS — I5033 Acute on chronic diastolic (congestive) heart failure: Secondary | ICD-10-CM | POA: Diagnosis present

## 2017-10-04 DIAGNOSIS — I214 Non-ST elevation (NSTEMI) myocardial infarction: Secondary | ICD-10-CM | POA: Diagnosis not present

## 2017-10-04 DIAGNOSIS — J9601 Acute respiratory failure with hypoxia: Secondary | ICD-10-CM | POA: Diagnosis not present

## 2017-10-04 DIAGNOSIS — J449 Chronic obstructive pulmonary disease, unspecified: Secondary | ICD-10-CM | POA: Diagnosis present

## 2017-10-04 DIAGNOSIS — Z7901 Long term (current) use of anticoagulants: Secondary | ICD-10-CM

## 2017-10-04 DIAGNOSIS — E669 Obesity, unspecified: Secondary | ICD-10-CM | POA: Diagnosis present

## 2017-10-04 LAB — COMPREHENSIVE METABOLIC PANEL
ALBUMIN: 3.3 g/dL — AB (ref 3.5–5.0)
ALK PHOS: 101 U/L (ref 38–126)
ALT: 10 U/L — ABNORMAL LOW (ref 14–54)
ANION GAP: 19 — AB (ref 5–15)
AST: 31 U/L (ref 15–41)
BILIRUBIN TOTAL: 0.6 mg/dL (ref 0.3–1.2)
BUN: 40 mg/dL — ABNORMAL HIGH (ref 6–20)
CALCIUM: 8.1 mg/dL — AB (ref 8.9–10.3)
CO2: 21 mmol/L — ABNORMAL LOW (ref 22–32)
Chloride: 100 mmol/L — ABNORMAL LOW (ref 101–111)
Creatinine, Ser: 5.21 mg/dL — ABNORMAL HIGH (ref 0.44–1.00)
GFR calc Af Amer: 9 mL/min — ABNORMAL LOW (ref >60)
GFR, EST NON AFRICAN AMERICAN: 8 mL/min — AB (ref >60)
GLUCOSE: 185 mg/dL — AB (ref 65–99)
POTASSIUM: 4.3 mmol/L (ref 3.5–5.1)
Sodium: 140 mmol/L (ref 135–145)
TOTAL PROTEIN: 6.4 g/dL — AB (ref 6.5–8.1)

## 2017-10-04 LAB — PROTIME-INR
INR: 1.15
Prothrombin Time: 14.6 seconds (ref 11.4–15.2)

## 2017-10-04 LAB — CBC
HEMATOCRIT: 25.7 % — AB (ref 35.0–47.0)
Hemoglobin: 8.8 g/dL — ABNORMAL LOW (ref 12.0–16.0)
MCH: 35.7 pg — AB (ref 26.0–34.0)
MCHC: 34.1 g/dL (ref 32.0–36.0)
MCV: 104.6 fL — AB (ref 80.0–100.0)
Platelets: 257 10*3/uL (ref 150–440)
RBC: 2.46 MIL/uL — ABNORMAL LOW (ref 3.80–5.20)
RDW: 15.6 % — ABNORMAL HIGH (ref 11.5–14.5)
WBC: 14.1 10*3/uL — ABNORMAL HIGH (ref 3.6–11.0)

## 2017-10-04 LAB — TROPONIN I: TROPONIN I: 0.13 ng/mL — AB (ref <0.03)

## 2017-10-04 LAB — HEPARIN LEVEL (UNFRACTIONATED): Heparin Unfractionated: 1.54 IU/mL — ABNORMAL HIGH (ref 0.30–0.70)

## 2017-10-04 LAB — APTT: aPTT: 33 seconds (ref 24–36)

## 2017-10-04 LAB — GLUCOSE, CAPILLARY: GLUCOSE-CAPILLARY: 91 mg/dL (ref 65–99)

## 2017-10-04 MED ORDER — IPRATROPIUM-ALBUTEROL 0.5-2.5 (3) MG/3ML IN SOLN: 3.0000 mL | Freq: Four times a day (QID) | RESPIRATORY_TRACT | Status: DC | PRN

## 2017-10-04 MED ORDER — FAMOTIDINE 20 MG PO TABS: 40.0000 mg | ORAL_TABLET | Freq: Every evening | ORAL | Status: DC

## 2017-10-04 MED ORDER — HEPARIN (PORCINE) IN NACL 100-0.45 UNIT/ML-% IJ SOLN
900.0000 [IU]/h | INTRAMUSCULAR | Status: DC
  Administered 2017-10-04: 900 [IU]/h via INTRAVENOUS
  Filled 2017-10-04: qty 250

## 2017-10-04 MED ORDER — CLOPIDOGREL BISULFATE 75 MG PO TABS: 75.0000 mg | ORAL_TABLET | Freq: Every day | ORAL | Status: DC

## 2017-10-04 MED ORDER — ONDANSETRON HCL 4 MG/2ML IJ SOLN
4.0000 mg | Freq: Four times a day (QID) | INTRAMUSCULAR | Status: DC | PRN
  Administered 2017-10-05: 4 mg via INTRAVENOUS
  Filled 2017-10-04: qty 2

## 2017-10-04 MED ORDER — ACETAMINOPHEN 650 MG RE SUPP: 650.0000 mg | Freq: Four times a day (QID) | RECTAL | Status: DC | PRN

## 2017-10-04 MED ORDER — CINACALCET HCL 30 MG PO TABS
30.0000 mg | ORAL_TABLET | Freq: Every day | ORAL | Status: DC
  Filled 2017-10-04: qty 1

## 2017-10-04 MED ORDER — MORPHINE SULFATE (PF) 2 MG/ML IV SOLN
1.0000 mg | INTRAVENOUS | Status: DC | PRN
  Administered 2017-10-05: 2 mg via INTRAVENOUS
  Filled 2017-10-04: qty 1

## 2017-10-04 MED ORDER — ACETAMINOPHEN 325 MG PO TABS: 650.0000 mg | ORAL_TABLET | Freq: Four times a day (QID) | ORAL | Status: DC | PRN

## 2017-10-04 MED ORDER — SODIUM CHLORIDE 0.9 % IV BOLUS
1000.0000 mL | Freq: Once | INTRAVENOUS | Status: AC
  Administered 2017-10-04: 1000 mL via INTRAVENOUS

## 2017-10-04 MED ORDER — TRAZODONE HCL 100 MG PO TABS
100.0000 mg | ORAL_TABLET | Freq: Every day | ORAL | Status: DC
  Administered 2017-10-04: 100 mg via ORAL
  Filled 2017-10-04: qty 1

## 2017-10-04 MED ORDER — ROSUVASTATIN CALCIUM 10 MG PO TABS: 10.0000 mg | ORAL_TABLET | Freq: Every day | ORAL | Status: DC

## 2017-10-04 MED ORDER — TORSEMIDE 100 MG PO TABS
100.0000 mg | ORAL_TABLET | Freq: Every day | ORAL | Status: DC
  Filled 2017-10-04: qty 1

## 2017-10-04 MED ORDER — ALPRAZOLAM 0.5 MG PO TABS
0.5000 mg | ORAL_TABLET | Freq: Every day | ORAL | Status: DC | PRN
  Administered 2017-10-04: 0.5 mg via ORAL
  Filled 2017-10-04: qty 1

## 2017-10-04 MED ORDER — SERTRALINE HCL 50 MG PO TABS: 50.0000 mg | ORAL_TABLET | Freq: Every day | ORAL | Status: DC

## 2017-10-04 MED ORDER — GUAIFENESIN-CODEINE 100-10 MG/5ML PO SOLN
ORAL | Status: AC
  Filled 2017-10-04: qty 5

## 2017-10-04 MED ORDER — CALCIUM ACETATE (PHOS BINDER) 667 MG PO CAPS
1334.0000 mg | ORAL_CAPSULE | Freq: Three times a day (TID) | ORAL | Status: DC
  Filled 2017-10-04 (×3): qty 2

## 2017-10-04 MED ORDER — GUAIFENESIN-CODEINE 100-10 MG/5ML PO SOLN
5.0000 mL | Freq: Four times a day (QID) | ORAL | Status: DC | PRN
  Administered 2017-10-04: 5 mL via ORAL

## 2017-10-04 MED ORDER — ONDANSETRON HCL 4 MG PO TABS: 4.0000 mg | ORAL_TABLET | Freq: Four times a day (QID) | ORAL | Status: DC | PRN

## 2017-10-04 NOTE — Progress Notes (Addendum)
ANTICOAGULATION CONSULT NOTE - Initial Consult  Pharmacy Consult for Heparin  Indication: chest pain/ACS  Allergies  Allergen Reactions  . Nodolor [Isometheptene-Dichloral-Apap] Other (See Comments)    Reaction:  Headaches     Patient Measurements: Height: 5\' 3"  (160 cm) Weight: 225 lb (102.1 kg) IBW/kg (Calculated) : 52.4 Heparin Dosing Weight: 76.5 kg   Vital Signs: Temp: 98.1 F (36.7 C) (06/17 2025) Temp Source: Oral (06/17 2025) BP: 110/74 (06/17 2025) Pulse Rate: 85 (06/17 2025)  Labs: Recent Labs    09/25/2017 2030 09/21/2017 2034  HGB  --  8.8*  HCT  --  25.7*  PLT  --  257  APTT 33  --   LABPROT 14.6  --   INR 1.15  --   CREATININE  --  5.21*  TROPONINI  --  0.13*    Estimated Creatinine Clearance: 11.3 mL/min (A) (by C-G formula based on SCr of 5.21 mg/dL (H)).   Medical History: Past Medical History:  Diagnosis Date  . Anemia of chronic disease   . Bronchiectasis (HCC) 08/2014    suggested by thoracic xray  . Chronic diastolic CHF (congestive heart failure) (HCC)    a. echo 10/2014: EF 60-65%, no RWMA, GR1DD; b. 03/2015 Echo: EF 60-65%, no rwma, Gr2 DD.  Marland Kitchen Chronic respiratory failure (HCC)    a. 2/2 COPD; b. on 4-5L via nasal cannula  . COPD (chronic obstructive pulmonary disease) (HCC)   . Depression   . ESRD (end stage renal disease) (HCC) 08/2011   a. on HD (TThSa), L forearm AV fistula, [name]  . Frequent headaches   . GERD (gastroesophageal reflux disease)   . GIB (gastrointestinal bleeding)    a. leading to cessation of warfarin 11/2014  . History of cardiac cath    a. 12/2014 Cath: nl cors. 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  Marland Kitchen History of colon polyps 2013   colonoscopy ([name])  . History of DVT of lower extremity 2009, 2017   a. left sided x2, with PE s/p IVC filter placement, coumadin stopped 2/2 GI bleed in 2016; b. 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  Marland Kitchen HLD (hyperlipidemia)   . HTN (hypertension)   . Morbid obesity  (HCC)   . Osteoarthritis   . Osteopenia 01/2013  . Pulmonary embolism (HCC) 2009  . Secondary hyperparathyroidism of renal origin (HCC)   . Severe aortic stenosis    a. echo 10/2014: mod to sev AS; b. 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 03/2015 Echo: EF 60-65%, Gr2 DD, m ild to mod AS (peak grad , mean ).    Medications:   (Not in a hospital admission)  Assessment: Pharmacy consulted to dose heparin in this 72 year old female admitted with ACS/NSTEMI. Pt was on Eliquis 2.5 mg PO BID at home, pt already had two doses on 6/17, last dose was at ~ 20:00.    CrCl = 11.3 ml/min   Goal of Therapy:  APTT :  68 - 109 Heparin level 0.3-0.7 units/ml Monitor platelets by anticoagulation protocol: Yes   Plan:  Will order baseline aPTT, INR and HL . Will not order bolus as pt had dose of Eliquis @ 20:00.  Will start Heparin 900 units/hr and draw HL 8 hrs after start of drip. Will use aPTT to dose heparin until HL and aPTT converge.   Minervia Osso D 09/19/2017,9:30 PM

## 2017-10-04 NOTE — ED Provider Notes (Signed)
[facility] Emergency Department Provider Note  Time seen: 8:35 PM  I have reviewed the triage vital signs and the nursing notes.   HISTORY  Chief Complaint Weakness    HPI Ariel Smith is a 72 y.o. female with a past medical history of CHF, ESRD on hemodialysis Monday, Wednesday, Friday, gastric reflux, hypertension, hyperlipidemia, presents to the emergency department for generalized fatigue/weakness.  According to the patient she had dialysis today and ever since dialysis she has been feeling very weak and fatigued.  Patient denies any fever.  States she has been coughing with states that is chronic times many months.  Denies it being any worse today.  Denies any sputum production or fever.  No abdominal pain, states occasional nausea but denies vomiting, states occasional loose stool.  Does make urine each day no dysuria reported.  Patient states her main concern today is just feeling fatigued and weak.   Past Medical History:  Diagnosis Date  . Anemia of chronic disease   . Bronchiectasis (HCC) 08/2014    suggested by thoracic xray  . Chronic diastolic CHF (congestive heart failure) (HCC)    a. echo 10/2014: EF 60-65%, no RWMA, GR1DD; b. 03/2015 Echo: EF 60-65%, no rwma, Gr2 DD.  Marland Kitchen Chronic respiratory failure (HCC)    a. 2/2 COPD; b. on 4-5L via nasal cannula  . COPD (chronic obstructive pulmonary disease) (HCC)   . Depression   . ESRD (end stage renal disease) (HCC) 08/2011   a. on HD (TThSa), L forearm AV fistula, [name]  . Frequent headaches   . GERD (gastroesophageal reflux disease)   . GIB (gastrointestinal bleeding)    a. leading to cessation of warfarin 11/2014  . History of cardiac cath    a. 12/2014 Cath: nl cors. 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  Marland Kitchen History of colon polyps 2013   colonoscopy ([name])  . History of DVT of lower extremity 2009, 2017   a. left sided x2, with PE s/p IVC filter placement, coumadin stopped 2/2 GI bleed in  2016; b. 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  Marland Kitchen HLD (hyperlipidemia)   . HTN (hypertension)   . Morbid obesity (HCC)   . Osteoarthritis   . Osteopenia 01/2013  . Pulmonary embolism (HCC) 2009  . Secondary hyperparathyroidism of renal origin (HCC)   . Severe aortic stenosis    a. echo 10/2014: mod to sev AS; b. 02/2015 TAVR (Owen/Cooper): Edwards Sapien 3 THV (size 23mm, model #9600TFX, ser# [id number]); c. 03/2015 Echo: EF 60-65%, Gr2 DD, m ild to mod AS (peak grad , mean ).    Patient Active Problem List   Diagnosis Date Noted  . Anxiety 07/20/2017  . Epidermal cyst of neck 06/23/2017  . Lumbar back pain 06/15/2017  . History of ischemic right MCA stroke 05/24/2017  . 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 Bilateral hearing loss 03/25/2017  . Breast mass, left 03/21/2017  . Bradycardia 03/21/2017  . Altered mental status 03/04/2017  . Mass of chest wall, right 04/09/2016  . 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 Insomnia 04/08/2015  . 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 Chronic respiratory failure (HCC)   . Anemia of chronic disease   . Chronic diastolic CHF (congestive heart failure) (HCC)   . Severe obesity (BMI 35.0-39.9) with comorbidity (HCC) 01/07/2015  . COPD (chronic obstructive pulmonary disease) (HCC) 12/20/2014  . Aortic valvar stenosis 11/16/2014  . Vitamin D  deficiency 11/03/2014  . Memory deficit 11/01/2014  . Right-sided thoracic back pain 08/17/2014  . Advanced care planning/counseling discussion 03/30/2014  . Encounter for general adult medical examination with abnormal findings 03/30/2014  . Vitamin B12 deficiency 01/27/2014  . Medicare annual wellness visit, subsequent 01/27/2013  . Osteopenia 01/18/2013  . Chronic cough 10/22/2012  . Osteoarthritis   . MDD (major depressive disorder), recurrent episode, moderate (HCC)   . HTN (hypertension)   . HLD (hyperlipidemia)   . GERD  (gastroesophageal reflux disease)   . End stage renal disease (HCC) 08/19/2011    Past Surgical History:  Procedure Laterality Date  . A/V FISTULAGRAM Left 07/09/2016   Procedure: A/V Fistulagram;  Surgeon: [name], MD;  Location: [facility] INVASIVE CV LAB;  Service: Cardiovascular;  Laterality: Left;  . BUNIONECTOMY Left 2003  . CARDIAC CATHETERIZATION N/A 01/18/2015   patent coronary arteries without significant osbtruction and preserved LV function, severe aortic stenosis; Procedure: Right/Left Heart Cath and Coronary Angiography;  Surgeon: Tonny Bollman, MD  . CHOLECYSTECTOMY  2012  . COLONOSCOPY  08/2011   colon polyps ([name])  . ESOPHAGOGASTRODUODENOSCOPY  08/2011   gastric cardia polyp  . ESOPHAGOGASTRODUODENOSCOPY Left 11/11/2014   Procedure: ESOPHAGOGASTRODUODENOSCOPY (EGD);  Surgeon: [name], MD;  Location: [facility] ENDOSCOPY;  Service: Endoscopy;  Laterality: Left;  . EXTERIORIZATION OF A CONTINUOUS AMBULATORY PERITONEAL DIALYSIS CATHETER  01/2013   removal 12/2103 - [name]  . hospitalization  12/2013   recurrent R pleural effusion due to peritoneal fluid translocation s/p rpt thoracentesis with 1.3 L fluid removed, ERSD started on HD this hospitalization  . PERIPHERAL VASCULAR CATHETERIZATION N/A 11/12/2014   Procedure: IVC Filter Insertion;  Surgeon: [name], MD;  Location: [facility] INVASIVE CV LAB;  Service: Cardiovascular;  Laterality: N/A;  . PERIPHERAL VASCULAR CATHETERIZATION Left 10/24/2015   Procedure: Lower Extremity Venography with intervention (thormbolysis/thrombectomy);  Surgeon: [name], MD;  Location: [facility] INVASIVE CV LAB;  Service: Cardiovascular;  Laterality: Left;  . REPLACEMENT TOTAL KNEE Left 2006  . SHOULDER ARTHROSCOPY Right 2009  . TEE WITHOUT CARDIOVERSION N/A 02/19/2015   Procedure: TRANSESOPHAGEAL ECHOCARDIOGRAM (TEE);  Surgeon: Tonny Bollman, MD;  Location: [facility] OR;  Service: Open Heart Surgery;  Laterality: N/A;  . TONSILLECTOMY  1955  .  TRANSCATHETER AORTIC VALVE REPLACEMENT, TRANSFEMORAL Right 02/19/2015   Procedure: TRANSCATHETER AORTIC VALVE REPLACEMENT, TRANSFEMORAL;  Surgeon: Tonny Bollman, MD;  Location: [facility] OR;  Service: Open Heart Surgery;  Laterality: Right;  . TUBAL LIGATION  1980  . US ECHOCARDIOGRAPHY  12/2013   EF 55-60%, nl LV sys fxn, mild-mod MR, AS, increased LV posterior wall thickness, mild TR    Prior to Admission medications   Medication Sig Start Date End Date Taking? Authorizing Provider  albuterol (VENTOLIN HFA) 108 (90 BASE) MCG/ACT inhaler Inhale 2 puffs into the lungs every 4 (four) hours as needed for wheezing or shortness of breath. 04/08/15   [name], MD  ALPRAZolam (NIRAVAM) 0.5 MG dissolvable tablet TAKE 1/2 TO 1 (ONE-HALF TO ONE) TABLET BY MOUTH ONCE DAILY AS NEEDED FOR ANXIETY 09/20/17   [name], MD  calcium acetate (PHOSLO) 667 MG capsule Take 1,334 mg by mouth 3 (three) times daily with meals.     [provider]  cinacalcet (SENSIPAR) 30 MG tablet Take 30 mg by mouth daily.    [provider]  clopidogrel (PLAVIX) 75 MG tablet TAKE 1 TABLET BY MOUTH ONCE DAILY WITH BREAKFAST 07/19/17   Antonieta Iba, MD  Cyanocobalamin (  B-12) 500 MCG TABS Take 500 mcg by mouth daily. 09/17/17   [name], MD  diphenhydrAMINE (BENADRYL) 25 MG tablet Take 25 mg by mouth every 6 (six) hours as needed for allergies.     [provider]  ELIQUIS 2.5 MG TABS tablet TAKE 1 TABLET BY MOUTH TWICE DAILY 05/28/17   [name], MD  famotidine (PEPCID) 40 MG tablet TAKE 1 TABLET BY MOUTH IN THE EVENING 05/04/17   Merwyn Katos, MD  guaiFENesin (MUCINEX) 600 MG 12 hr tablet Take 1 tablet (600 mg total) by mouth 2 (two) times daily as needed for cough. 07/30/17   Salary, Evelena Asa, MD  ipratropium-albuterol (DUONEB) 0.5-2.5 (3) MG/3ML SOLN Take 3 mLs by nebulization every 6 (six) hours as needed (shortness of breath/ wheezing). 07/14/17   Ihor Austin, MD   lidocaine-prilocaine (EMLA) cream Apply 1 application topically as needed (topical anesthesia for hemodialysis if Gebauers and Lidocaine injection are ineffective.). 11/22/14   Auburn Bilberry, MD  loratadine (CLARITIN) 10 MG tablet Take 1 tablet (10 mg total) by mouth daily. 07/31/17   Salary, Evelena Asa, MD  midodrine (PROAMATINE) 5 MG tablet Take 1 tablet (5 mg total) by mouth 3 (three) times daily with meals. 07/30/17   Salary, Evelena Asa, MD  Multiple Vitamin (MULTIVITAMIN WITH MINERALS) TABS tablet Take 1 tablet by mouth daily.    [provider]  rosuvastatin (CRESTOR) 10 MG tablet Take 1 tablet (10 mg total) by mouth daily. 09/27/17   [name], MD  sertraline (ZOLOFT) 50 MG tablet Take 1 tablet (50 mg total) by mouth daily. 06/15/17   [name], MD  torsemide (DEMADEX) 100 MG tablet Take 1 tablet (100 mg total) by mouth daily. 09/09/17   [name], MD  traMADol (ULTRAM) 50 MG tablet Take 0.5-1 tablets (25-50 mg total) by mouth 2 (two) times daily as needed. Patient taking differently: Take 25-50 mg by mouth 2 (two) times daily as needed for moderate pain.  06/15/17   [name], MD  traZODone (DESYREL) 50 MG tablet Take 100 mg by mouth at bedtime.    [provider]    Allergies  Allergen Reactions  . Nodolor [Isometheptene-Dichloral-Apap] Other (See Comments)    Reaction:  Headaches     Family History  Problem Relation Age of Onset  . Deep vein thrombosis Brother   . Cancer Mother 61       colon  . Stroke Mother   . Cancer Father        prostate  . Hypertension Father   . Dementia Father 62  . Hypertension Brother   . Breast cancer Daughter 1  . Diabetes Neg Hx   . CAD Neg Hx     Social History Social History   Tobacco Use  . Smoking status: Former Smoker    Packs/day: 2.00    Years: 30.00    Pack years: 60.00    Types: Cigarettes    Start date: 04/20/1978    Last attempt to quit: 04/21/2007    Years since quitting: 10.4  .  Smokeless tobacco: Never Used  Substance Use Topics  . Alcohol use: No  . Drug use: No    Review of Systems Constitutional: Negative for fever.  Positive for generalized fatigue/weakness. Eyes: Negative for visual complaints ENT: Negative for recent illness/congestion Cardiovascular: Negative for chest pain. Respiratory: Negative for shortness of breath. Gastrointestinal: Negative for abdominal pain.  Some nausea, positive for loose stool. Genitourinary: Negative for urinary compaints Musculoskeletal: Wears  a back brace for chronic back pain. Skin: Negative for skin complaints  Neurological: Negative for headache All other ROS negative  ____________________________________________   PHYSICAL EXAM:  VITAL SIGNS: ED Triage Vitals [10/14/2017 2025]  Enc Vitals Group     BP 110/74     Pulse Rate 85     Resp      Temp 98.1 F (36.7 C)     Temp Source Oral     SpO2 98 %     Weight 225 lb (102.1 kg)     Height 5\' 3"  (1.6 m)     Head Circumference      Peak Flow      Pain Score 0     Pain Loc      Pain Edu?      Excl. in GC?     Constitutional: Alert and oriented. Well appearing and in no distress. Eyes: Normal exam ENT   Head: Normocephalic and atraumatic.   Mouth/Throat: Mucous membranes are moist. Cardiovascular: Normal rate, regular rhythm. No murmur Respiratory: Normal respiratory effort without tachypnea nor retractions. Breath sounds are clear.  Frequent dry cough throughout examination. Gastrointestinal: Soft and nontender. No distention.  Musculoskeletal: Nontender with normal range of motion in all extremities.   Neurologic:  Normal speech and language. No gross focal neurologic deficits  Skin:  Skin is warm, dry and intact.  Psychiatric: Mood and affect are normal.   ____________________________________________    EKG  EKG shows sinus tachycardia 127 bpm with a narrow QRS, normal axis, largely normal intervals but the patient does have ST elevation  in aVR only with diffuse ST depressions most significant in the inferolateral leads.  ____________________________________________    RADIOLOGY  Chest x-ray shows nonspecific pulmonary interstitial opacity could be interstitial edema versus infection.  ____________________________________________   INITIAL IMPRESSION / ASSESSMENT AND PLAN / ED COURSE  Pertinent labs & imaging results that were available during my care of the patient were reviewed by me and considered in my medical decision making (see chart for details).  Patient presents emergency department for generalized fatigue and weakness.  Differential would include electrolyte or metabolic abnormality, dehydration, infectious etiology.  We will check labs, chest x-ray and continue to closely monitor.  Overall the patient appears well with reassuring vitals.  Labs are pending, EKG is concerning given aVR elevation with fairly diffuse ST depressions throughout.  I compared to her prior EKG 08/27/2017 there is significant change.  I discussed with our on-call cardiologist Dr. Darrold Junker.  States it is concerning, but does not meet STEMI criteria, would recommend starting heparin if there are no other contraindications.  We will start heparin drip.  Awaiting labs and x-ray.  Patient's blood pressure is now low currently in the 70s to 80s systolic.  We will start IV fluids and continue to closely monitor.  Daughter is now here who states the patient has been weak ever since getting home from dialysis around 10:00 this morning.  Patient told the nurse she was having some chest discomfort, repeat EKG reviewed and interpreted by myself 21: 04: 41 shows sinus tachycardia 124 bpm with a narrow QRS, normal axis, largely normal intervals, again with aVR elevation and diffuse ST depression especially in the inferolateral leads.    I evaluated the patient she states very minimal chest discomfort but states that has been ongoing for several days.   Patient takes Eliquis, will discuss with pharmacy when to dose heparin.  We will admit to the hospital service for  continued treatment.  Patient's blood pressure is now elevated to 108 systolic receiving IV fluids.  X-ray is interstitial edema vs infection.  We will add on a lactic acid as a precaution.  However given the patient's abnormal EKG, her x-ray appears most consistent with pulmonary edema to myself.  No fever.  Patient will be admitted to the hospitalist service.  Patient is feeling more labored, states she is getting tired with her breathing.  Given the interstitial edema and her labored breathing we will place on BiPAP.   CRITICAL CARE Performed by: Minna Antis   Total critical care time: 30 minutes  Critical care time was exclusive of separately billable procedures and treating other patients.  Critical care was necessary to treat or prevent imminent or life-threatening deterioration.  Critical care was time spent personally by me on the following activities: development of treatment plan with patient and/or surrogate as well as nursing, discussions with consultants, evaluation of patient's response to treatment, examination of patient, obtaining history from patient or surrogate, ordering and performing treatments and interventions, ordering and review of laboratory studies, ordering and review of radiographic studies, pulse oximetry and re-evaluation of patient's condition.    ____________________________________________   FINAL CLINICAL IMPRESSION(S) / ED DIAGNOSES  Weakness Hypotension   Minna Antis, MD 10-11-17 2147

## 2017-10-04 NOTE — ED Notes (Signed)
Pt resting peacefully with bi-pap on, family at bedside.

## 2017-10-04 NOTE — H&P (Signed)
[facility] [place] [facility]   PATIENT NAME: Ariel Smith    MR#:  [id number]  DATE OF BIRTH:  06/09/1945  DATE OF ADMISSION:  09/21/2017  PRIMARY CARE PHYSICIAN: [name], MD   REQUESTING/REFERRING PHYSICIAN: Lenard Lance, MD  CHIEF COMPLAINT:   Chief Complaint  Patient presents with  . Weakness    HISTORY OF PRESENT ILLNESS:  Wynee Matarazzo  is a 72 y.o. female who presents with weakness, back pain, shortness of breath.  Patient started initially feeling short of breath earlier today, had some pain between her shoulder blades in her back, and felt very weak.  Here in the ED she was found to have pulmonary edema versus infiltrate.  Her troponin was elevated.  She is a dialysis patient.  She had to be placed on BiPAP in the ED.  Hospitalist were called for admission  PAST MEDICAL HISTORY:   Past Medical History:  Diagnosis Date  . Anemia of chronic disease   . Bronchiectasis (HCC) 08/2014    suggested by thoracic xray  . Chronic diastolic CHF (congestive heart failure) (HCC)    a. echo 10/2014: EF 60-65%, no RWMA, GR1DD; b. 03/2015 Echo: EF 60-65%, no rwma, Gr2 DD.  Marland Kitchen Chronic respiratory failure (HCC)    a. 2/2 COPD; b. on 4-5L via nasal cannula  . COPD (chronic obstructive pulmonary disease) (HCC)   . Depression   . ESRD (end stage renal disease) (HCC) 08/2011   a. on HD (TThSa), L forearm AV fistula, [name]  . Frequent headaches   . GERD (gastroesophageal reflux disease)   . GIB (gastrointestinal bleeding)    a. leading to cessation of warfarin 11/2014  . History of cardiac cath    a. 12/2014 Cath: nl cors. 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  Marland Kitchen History of colon polyps 2013   colonoscopy ([name])  . History of DVT of lower extremity 2009, 2017   a. left sided x2, with PE s/p IVC filter placement, coumadin stopped 2/2 GI bleed in 2016; b. 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  Marland Kitchen HLD (hyperlipidemia)   . HTN  (hypertension)   . Morbid obesity (HCC)   . Osteoarthritis   . Osteopenia 01/2013  . Pulmonary embolism (HCC) 2009  . Secondary hyperparathyroidism of renal origin (HCC)   . Severe aortic stenosis    a. echo 10/2014: mod to sev AS; b. 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 03/2015 Echo: EF 60-65%, Gr2 DD, m ild to mod AS (peak grad , mean ).     PAST SURGICAL HISTORY:   Past Surgical History:  Procedure Laterality Date  . A/V FISTULAGRAM Left 07/09/2016   Procedure: A/V Fistulagram;  Surgeon: [name], MD;  Location: [facility] INVASIVE CV LAB;  Service: Cardiovascular;  Laterality: Left;  . BUNIONECTOMY Left 2003  . CARDIAC CATHETERIZATION N/A 01/18/2015   patent coronary arteries without significant osbtruction and preserved LV function, severe aortic stenosis; Procedure: Right/Left Heart Cath and Coronary Angiography;  Surgeon: Tonny Bollman, MD  . CHOLECYSTECTOMY  2012  . COLONOSCOPY  08/2011   colon polyps ([name])  . ESOPHAGOGASTRODUODENOSCOPY  08/2011   gastric cardia polyp  . ESOPHAGOGASTRODUODENOSCOPY Left 11/11/2014   Procedure: ESOPHAGOGASTRODUODENOSCOPY (EGD);  Surgeon: [name], MD;  Location: [facility] ENDOSCOPY;  Service: Endoscopy;  Laterality: Left;  . EXTERIORIZATION OF A CONTINUOUS AMBULATORY PERITONEAL DIALYSIS CATHETER  01/2013   removal 12/2103 - [name]  . hospitalization  12/2013   recurrent R pleural effusion due to peritoneal fluid translocation s/p rpt thoracentesis with 1.3 L fluid removed, ERSD started on HD this hospitalization  . PERIPHERAL VASCULAR CATHETERIZATION N/A 11/12/2014   Procedure: IVC Filter Insertion;  Surgeon: [name], MD;  Location: [facility] INVASIVE CV LAB;  Service: Cardiovascular;  Laterality: N/A;  . PERIPHERAL VASCULAR CATHETERIZATION Left 10/24/2015   Procedure: Lower Extremity Venography with intervention (thormbolysis/thrombectomy);  Surgeon: [name], MD;  Location:  [facility] INVASIVE CV LAB;  Service: Cardiovascular;  Laterality: Left;  . REPLACEMENT TOTAL KNEE Left 2006  . SHOULDER ARTHROSCOPY Right 2009  . TEE WITHOUT CARDIOVERSION N/A 02/19/2015   Procedure: TRANSESOPHAGEAL ECHOCARDIOGRAM (TEE);  Surgeon: Tonny Bollman, MD;  Location: [facility] OR;  Service: Open Heart Surgery;  Laterality: N/A;  . TONSILLECTOMY  1955  . TRANSCATHETER AORTIC VALVE REPLACEMENT, TRANSFEMORAL Right 02/19/2015   Procedure: TRANSCATHETER AORTIC VALVE REPLACEMENT, TRANSFEMORAL;  Surgeon: Tonny Bollman, MD;  Location: [facility] OR;  Service: Open Heart Surgery;  Laterality: Right;  . TUBAL LIGATION  1980  . US ECHOCARDIOGRAPHY  12/2013   EF 55-60%, nl LV sys fxn, mild-mod MR, AS, increased LV posterior wall thickness, mild TR     SOCIAL HISTORY:   Social History   Tobacco Use  . Smoking status: Former Smoker    Packs/day: 2.00    Years: 30.00    Pack years: 60.00    Types: Cigarettes    Start date: 04/20/1978    Last attempt to quit: 04/21/2007    Years since quitting: 10.4  . Smokeless tobacco: Never Used  Substance Use Topics  . Alcohol use: No     FAMILY HISTORY:   Family History  Problem Relation Age of Onset  . Deep vein thrombosis Brother   . Cancer Mother 28       colon  . Stroke Mother   . Cancer Father        prostate  . Hypertension Father   . Dementia Father 47  . Hypertension Brother   . Breast cancer Daughter 52  . Diabetes Neg Hx   . CAD Neg Hx      DRUG ALLERGIES:   Allergies  Allergen Reactions  . Nodolor [Isometheptene-Dichloral-Apap] Other (See Comments)    Reaction:  Headaches     MEDICATIONS AT HOME:   Prior to Admission medications   Medication Sig Start Date End Date Taking? Authorizing Provider  albuterol (VENTOLIN HFA) 108 (90 BASE) MCG/ACT inhaler Inhale 2 puffs into the lungs every 4 (four) hours as needed for wheezing or shortness of breath. 04/08/15  Yes [name], MD  ALPRAZolam (NIRAVAM) 0.5 MG dissolvable tablet TAKE  1/2 TO 1 (ONE-HALF TO ONE) TABLET BY MOUTH ONCE DAILY AS NEEDED FOR ANXIETY 09/20/17  Yes [name], MD  calcium acetate (PHOSLO) 667 MG capsule Take 1,334 mg by mouth 3 (three) times daily with meals.    Yes [provider]  cinacalcet (SENSIPAR) 30 MG tablet Take 30 mg by mouth daily.   Yes [provider]  clopidogrel (PLAVIX) 75 MG tablet TAKE 1 TABLET BY MOUTH ONCE DAILY WITH BREAKFAST 07/19/17  Yes Gollan, Tollie Pizza, MD  Cyanocobalamin (B-12) 500 MCG TABS Take 500 mcg by mouth daily. 09/17/17  Yes [name], MD  diphenhydrAMINE (BENADRYL) 25 MG tablet Take 25 mg by mouth every 6 (six) hours as needed for allergies.    Yes [provider]  ELIQUIS 2.5 MG TABS tablet TAKE 1 TABLET BY MOUTH TWICE DAILY  05/28/17  Yes [name], MD  famotidine (PEPCID) 40 MG tablet TAKE 1 TABLET BY MOUTH IN THE EVENING 05/04/17  Yes Merwyn Katos, MD  ipratropium-albuterol (DUONEB) 0.5-2.5 (3) MG/3ML SOLN Take 3 mLs by nebulization every 6 (six) hours as needed (shortness of breath/ wheezing). 07/14/17  Yes Pyreddy, Vivien Rota, MD  lidocaine-prilocaine (EMLA) cream Apply 1 application topically as needed (topical anesthesia for hemodialysis if Gebauers and Lidocaine injection are ineffective.). 11/22/14  Yes Auburn Bilberry, MD  midodrine (PROAMATINE) 5 MG tablet Take 1 tablet (5 mg total) by mouth 3 (three) times daily with meals. 07/30/17  Yes Salary, Evelena Asa, MD  Multiple Vitamin (MULTIVITAMIN WITH MINERALS) TABS tablet Take 1 tablet by mouth daily.   Yes [provider]  rosuvastatin (CRESTOR) 10 MG tablet Take 1 tablet (10 mg total) by mouth daily. 09/27/17  Yes [name], MD  sertraline (ZOLOFT) 50 MG tablet Take 1 tablet (50 mg total) by mouth daily. 06/15/17  Yes [name], MD  torsemide (DEMADEX) 100 MG tablet Take 1 tablet (100 mg total) by mouth daily. 09/09/17  Yes [name], MD  traMADol (ULTRAM) 50 MG tablet Take 0.5-1 tablets (25-50  mg total) by mouth 2 (two) times daily as needed. Patient taking differently: Take 25-50 mg by mouth 2 (two) times daily as needed for moderate pain.  06/15/17  Yes [name], MD  traZODone (DESYREL) 50 MG tablet Take 100 mg by mouth at bedtime.   Yes [provider]  guaiFENesin (MUCINEX) 600 MG 12 hr tablet Take 1 tablet (600 mg total) by mouth 2 (two) times daily as needed for cough. Patient not taking: Reported on 2017/10/11 07/30/17   Salary, Jetty Duhamel D, MD  loratadine (CLARITIN) 10 MG tablet Take 1 tablet (10 mg total) by mouth daily. Patient not taking: Reported on October 11, 2017 07/31/17   Salary, Evelena Asa, MD    REVIEW OF SYSTEMS:  Review of Systems  Constitutional: Negative for chills, fever, malaise/fatigue and weight loss.  HENT: Negative for ear pain, hearing loss and tinnitus.   Eyes: Negative for blurred vision, double vision, pain and redness.  Respiratory: Positive for shortness of breath. Negative for cough and hemoptysis.   Cardiovascular: Negative for chest pain, palpitations, orthopnea and leg swelling.  Gastrointestinal: Negative for abdominal pain, constipation, diarrhea, nausea and vomiting.  Genitourinary: Negative for dysuria, frequency and hematuria.  Musculoskeletal: Positive for back pain. Negative for joint pain and neck pain.  Skin:       No acne, rash, or lesions  Neurological: Positive for weakness. Negative for dizziness, tremors and focal weakness.  Endo/Heme/Allergies: Negative for polydipsia. Does not bruise/bleed easily.  Psychiatric/Behavioral: Negative for depression. The patient is not nervous/anxious and does not have insomnia.      VITAL SIGNS:   Vitals:   2017/10/11 2022 Oct 11, 2017 2025 2017-10-11 2158  BP:  110/74   Pulse:  85 (!) 122  Resp:   (!) 21  Temp:  98.1 F (36.7 C)   TempSrc:  Oral   SpO2: 100% 98% 100%  Weight:  102.1 kg (225 lb)   Height:  5\' 3"  (1.6 m)    Wt Readings from Last 3 Encounters:  10-11-17 102.1 kg (225  lb)  09/17/17 102.3 kg (225 lb 8 oz)  08/27/17 101.3 kg (223 lb 4 oz)    PHYSICAL EXAMINATION:  Physical Exam  Vitals reviewed. Constitutional: She is oriented to person, place, and time. She appears well-developed and well-nourished. No distress.  HENT:  Head: Normocephalic  and atraumatic.  Mouth/Throat: Oropharynx is clear and moist.  Eyes: Pupils are equal, round, and reactive to light. Conjunctivae and EOM are normal. No scleral icterus.  Neck: Normal range of motion. Neck supple. No JVD present. No thyromegaly present.  Cardiovascular: Regular rhythm and intact distal pulses. Exam reveals no gallop and no friction rub.  No murmur heard. tachycardic  Respiratory: Effort normal. No respiratory distress. She has no wheezes. She has rales.  GI: Soft. Bowel sounds are normal. She exhibits no distension. There is no tenderness.  Musculoskeletal: Normal range of motion. She exhibits no edema.  No arthritis, no gout  Lymphadenopathy:    She has no cervical adenopathy.  Neurological: She is alert and oriented to person, place, and time. No cranial nerve deficit.  No dysarthria, no aphasia  Skin: Skin is warm and dry. No rash noted. No erythema.  Psychiatric: She has a normal mood and affect. Her behavior is normal. Judgment and thought content normal.    LABORATORY PANEL:   CBC Recent Labs  Lab 10/14/2017 2034  WBC 14.1*  HGB 8.8*  HCT 25.7*  PLT 257   ------------------------------------------------------------------------------------------------------------------  Chemistries  Recent Labs  Lab 10/14/17 2034  NA 140  K 4.3  CL 100*  CO2 21*  GLUCOSE 185*  BUN 40*  CREATININE 5.21*  CALCIUM 8.1*  AST 31  ALT 10*  ALKPHOS 101  BILITOT 0.6   ------------------------------------------------------------------------------------------------------------------  Cardiac Enzymes Recent Labs  Lab 2017-10-14 2034  TROPONINI 0.13*    ------------------------------------------------------------------------------------------------------------------  RADIOLOGY:  Dg Chest Portable 1 View  Result Date: October 14, 2017 CLINICAL DATA:  72 year old female with weakness. Status post dialysis today. EXAM: PORTABLE CHEST 1 VIEW COMPARISON:  07/29/2017 and earlier. FINDINGS: Portable AP upright view at 2104 hours. Improved lung volumes compared to April. Stable cardiomegaly and mediastinal contours. Sequelae of TAVR. No pneumothorax, pleural effusion or consolidation identified. Nonspecific bilateral increased pulmonary interstitial opacity, somewhat nodular in the right upper lobe. Paucity bowel gas in the upper abdomen. No acute osseous abnormality identified. IMPRESSION: 1. Nonspecific increased bilateral pulmonary interstitial opacity, somewhat nodular in the right lung. Top differential considerations include: - mild or developing pulmonary interstitial edema, - hematogenously disseminated infection, - viral/atypical respiratory infection. 2. No pleural effusion or other acute cardiopulmonary abnormality identified. Electronically Signed   By: Odessa Fleming M.D.   On: 2017/10/14 21:35    EKG:   Orders placed or performed during the hospital encounter of Oct 14, 2017  . ED EKG  . ED EKG  . EKG 12-Lead  . EKG 12-Lead  . EKG 12-Lead  . EKG 12-Lead  . EKG 12-Lead  . EKG 12-Lead  . EKG 12-Lead  . EKG 12-Lead    IMPRESSION AND PLAN:  Principal Problem:   ST segment depression -patient had complained of back pain, which might be chest pain equivalent.  She has an elevated troponin, and new lateral ST segment depressions.  Cardiology was contacted by ED physician and recommended placing her on a heparin drip.  We will cycle her cardiac enzymes, get an echocardiogram and cardiology consult Active Problems:   End stage renal disease on dialysis [facility]) -nephrology consult for dialysis support   COPD (chronic obstructive pulmonary disease) (HCC)  -continue home meds   Acute on chronic diastolic CHF (congestive heart failure) (HCC) -she is currently on BiPAP, cardiology consult as above   HTN (hypertension) -continue home meds   HLD (hyperlipidemia) -Home dose antilipid   GERD (gastroesophageal reflux disease) -Home dose H2 blocker  Anxiety -home dose anxiolytic  Chart review performed and case discussed with ED provider. Labs, imaging and/or ECG reviewed by provider and discussed with patient/family. Management plans discussed with the patient and/or family.  DVT PROPHYLAXIS: Systemic anticoagulation  GI PROPHYLAXIS: H2 blocker  ADMISSION STATUS: Inpatient  CODE STATUS: DNR Code Status History    Date Active Date Inactive Code Status Order ID Comments User Context   07/28/2017 1328 07/30/2017 2057 DNR [id number]  Arnaldo Natal, MD ED   07/13/2017 1039 07/14/2017 2337 DNR [id number]  Ramonita Lab, MD Inpatient   07/12/2017 1006 07/13/2017 1038 Full Code [id number]  Ramonita Lab, MD Inpatient   03/04/2017 1026 03/05/2017 2331 Full Code [id number]  Alford Highland, MD ED   07/09/2016 0948 07/09/2016 1330 Full Code [id number]  [name], MD Inpatient   10/21/2015 2154 10/25/2015 2014 Full Code [id number]  Gracelyn Nurse, MD Inpatient   02/19/2015 1026 02/22/2015 1222 Full Code [id number]  Purcell Nails, MD Inpatient   01/18/2015 1354 01/18/2015 2158 Full Code [id number]  Tonny Bollman, MD Inpatient   12/28/2014 1611 12/31/2014 1446 Full Code [id number]  Houston Siren, MD ED   12/21/2014 0135 12/22/2014 1921 Full Code [id number]  Oralia Manis, MD Inpatient   12/11/2014 1418 12/14/2014 2010 DNR [id number]  Crissie Figures, MD Inpatient   11/12/2014 1251 11/22/2014 2133 DNR [id number]  Alford Highland, MD Inpatient   11/09/2014 1431 11/12/2014 1251 Full Code [id number]  Ramonita Lab, MD Inpatient    Questions for Most Recent Historical Code Status (Order [id number])    Question Answer Comment   In the event of cardiac or respiratory ARREST Do not  call a "code blue"    In the event of cardiac or respiratory ARREST Do not perform Intubation, CPR, defibrillation or ACLS    In the event of cardiac or respiratory ARREST Use medication by any route, position, wound care, and other measures to relive pain and suffering. May use oxygen, suction and manual treatment of airway obstruction as needed for comfort.    Comments rn may pronounce       TOTAL TIME TAKING CARE OF THIS PATIENT: 45 minutes.   Anne Hahn, Kimbra Marcelino FIELDING 10-20-2017, 10:18 PM  [facility]  Office  [phone]  CC: Primary care physician; [name], MD  Note:  This document was prepared using Dragon voice recognition software and may include unintentional dictation errors.

## 2017-10-04 NOTE — ED Notes (Signed)
Pt family and pt request BI Pap as pt is tired and very labored in breathing. EDP verbal order for Bi Pap

## 2017-10-04 NOTE — Progress Notes (Signed)
eLink Physician-Brief Progress Note Patient Name: Ariel Smith DOB: 24-Feb-1946 MRN: [id number]   Date of Service  09/23/2017  HPI/Events of Note  72 yo female with PMH of EDRD on HD, Presents now with SOB and weakness. EKG with new ST depression in lateral leads. Troponin = 0.13. Cardiology consulted --> heparin IV gtt and they will see in AM. Now on BiPAP. PCCM asked to assume care in ICU. Patient is a DNR/DNI. PCCM now evaluating the patient at bedside.   eICU Interventions  No new orders.     Intervention Category Evaluation Type: New Patient Evaluation  [name] 10/06/2017, 11:17 PM

## 2017-10-04 NOTE — ED Notes (Signed)
Admitting MD at bedside.

## 2017-10-04 NOTE — ED Notes (Signed)
Pt is on 3L chronically.

## 2017-10-04 NOTE — ED Notes (Signed)
Provided incontinent care

## 2017-10-04 NOTE — ED Triage Notes (Addendum)
Pt presents via [facility] from home. Pt received dialysis today and is experiencing weakness. Pt states she received full dialysis. Pt is labored breathing and uncomfortable. EMS reports that pt has had a persistent cough that has been there for mths. Pt states she is tired and just weak feeling. Pt family is at bedside. Awaiting EDP at this time.

## 2017-10-04 NOTE — Consult Note (Addendum)
Name: Ariel Smith MRN: [id number] DOB: [birth year]    ADMISSION DATE:  09/26/2017 CONSULTATION DATE: 10/14/2017  REFERRING MD : Dr. Anne Hahn   CHIEF COMPLAINT: Weakness   BRIEF PATIENT DESCRIPTION:  72 yr old female admitted with ST depression with mildly elevated troponin and acute on chronic respiratory failure secondary to pulmonary edema vs. pneumonia requiring Bipap   SIGNIFICANT EVENTS/STUDIES:  06/17 Pt admitted to stepdown unit   HISTORY OF PRESENT ILLNESS:   This is a 72 yo female with a PMH of Severe Aortic Stenosis (s/p TAVR 02/2015) , Stroke, Bilateral EssentialTremors,  Hyperparathyroidism, Mixed Dementia, Osteopenia, Osteoarthritis, Morbid Obesity, HTN, Hyperlipidemia, DVT with PE s/p IVC filter placement and on eliquis, GERD, GI Bleed, ESRD on HD, Depression, COPD, Anemia of Chronic Disease, Bronchiectasis, and Chronic Diastolic CHF.  She presented to [facility] ER on 06/17 with generalized weakness and fatigue following HD session today 06/17. She also endorsed a chronic cough that started several months ago and occasional loose stools.  In the ER EKG revealed ST elevation in aVR only with diffuse ST depression and initial troponin 0.13.  Therefore, ER physician notified on call Cardiologist Dr. Cassie Freer who indicated pt did not meet STEMI criteria recommended starting heparin gtt.  Pt also hypotensive she received 1L NS bolus with slight improvement of bp.  She developed shortness of breath requiring Bipap, CXR concerning for pulmonary edema vs. pneumonia.  She was subsequently admitted to the stepdown unit by hospitalist team for further workup and treatment.   PAST MEDICAL HISTORY :   has a past medical history of Anemia of chronic disease, Bronchiectasis (HCC) (08/2014 ), Chronic diastolic CHF (congestive heart failure) (HCC), Chronic respiratory failure (HCC), COPD (chronic obstructive pulmonary disease) (HCC), Depression, ESRD (end stage renal disease) (HCC) (08/2011), Frequent  headaches, GERD (gastroesophageal reflux disease), GIB (gastrointestinal bleeding), History of cardiac cath, History of colon polyps (2013), History of DVT of lower extremity (2009, 2017), HLD (hyperlipidemia), HTN (hypertension), Morbid obesity (HCC), Osteoarthritis, Osteopenia (01/2013), Pulmonary embolism (HCC) (2009), Secondary hyperparathyroidism of renal origin (HCC), and Severe aortic stenosis.  has a past surgical history that includes Cholecystectomy (2012); Bunionectomy (Left, 2003); Replacement total knee (Left, 2006); Shoulder arthroscopy (Right, 2009); Colonoscopy (08/2011); Esophagogastroduodenoscopy (08/2011); Tonsillectomy (1955); Tubal ligation (1980); Exteriorization of a continuous ambulatory peritoneal dialysis catheter (01/2013); hospitalization (12/2013); US ECHOCARDIOGRAPHY (12/2013); Esophagogastroduodenoscopy (Left, 11/11/2014); Cardiac catheterization (N/A, 11/12/2014); Cardiac catheterization (N/A, 01/18/2015); Transcatheter aortic valve replacement, transfemoral (Right, 02/19/2015); TEE without cardioversion (N/A, 02/19/2015); Cardiac catheterization (Left, 10/24/2015); and A/V Fistulagram (Left, 07/09/2016). Prior to Admission medications   Medication Sig Start Date End Date Taking? Authorizing Provider  albuterol (VENTOLIN HFA) 108 (90 BASE) MCG/ACT inhaler Inhale 2 puffs into the lungs every 4 (four) hours as needed for wheezing or shortness of breath. 04/08/15  Yes [name], MD  ALPRAZolam (NIRAVAM) 0.5 MG dissolvable tablet TAKE 1/2 TO 1 (ONE-HALF TO ONE) TABLET BY MOUTH ONCE DAILY AS NEEDED FOR ANXIETY 09/20/17  Yes [name], MD  calcium acetate (PHOSLO) 667 MG capsule Take 1,334 mg by mouth 3 (three) times daily with meals.    Yes [provider]  cinacalcet (SENSIPAR) 30 MG tablet Take 30 mg by mouth daily.   Yes [provider]  clopidogrel (PLAVIX) 75 MG tablet TAKE 1 TABLET BY MOUTH ONCE DAILY WITH BREAKFAST 07/19/17  Yes Gollan, Tollie Pizza, MD    Cyanocobalamin (B-12) 500 MCG TABS Take 500 mcg by mouth daily. 09/17/17  Yes [name], MD  diphenhydrAMINE (BENADRYL) 25 MG  tablet Take 25 mg by mouth every 6 (six) hours as needed for allergies.    Yes [provider]  ELIQUIS 2.5 MG TABS tablet TAKE 1 TABLET BY MOUTH TWICE DAILY 05/28/17  Yes [name], MD  famotidine (PEPCID) 40 MG tablet TAKE 1 TABLET BY MOUTH IN THE EVENING 05/04/17  Yes Merwyn Katos, MD  ipratropium-albuterol (DUONEB) 0.5-2.5 (3) MG/3ML SOLN Take 3 mLs by nebulization every 6 (six) hours as needed (shortness of breath/ wheezing). 07/14/17  Yes Pyreddy, Vivien Rota, MD  lidocaine-prilocaine (EMLA) cream Apply 1 application topically as needed (topical anesthesia for hemodialysis if Gebauers and Lidocaine injection are ineffective.). 11/22/14  Yes Auburn Bilberry, MD  midodrine (PROAMATINE) 5 MG tablet Take 1 tablet (5 mg total) by mouth 3 (three) times daily with meals. 07/30/17  Yes Salary, Evelena Asa, MD  Multiple Vitamin (MULTIVITAMIN WITH MINERALS) TABS tablet Take 1 tablet by mouth daily.   Yes [provider]  rosuvastatin (CRESTOR) 10 MG tablet Take 1 tablet (10 mg total) by mouth daily. 09/27/17  Yes [name], MD  sertraline (ZOLOFT) 50 MG tablet Take 1 tablet (50 mg total) by mouth daily. 06/15/17  Yes [name], MD  torsemide (DEMADEX) 100 MG tablet Take 1 tablet (100 mg total) by mouth daily. 09/09/17  Yes [name], MD  traMADol (ULTRAM) 50 MG tablet Take 0.5-1 tablets (25-50 mg total) by mouth 2 (two) times daily as needed. Patient taking differently: Take 25-50 mg by mouth 2 (two) times daily as needed for moderate pain.  06/15/17  Yes [name], MD  traZODone (DESYREL) 50 MG tablet Take 100 mg by mouth at bedtime.   Yes [provider]  guaiFENesin (MUCINEX) 600 MG 12 hr tablet Take 1 tablet (600 mg total) by mouth 2 (two) times daily as needed for cough. Patient not taking: Reported on 10/12/2017  07/30/17   Salary, Jetty Duhamel D, MD  loratadine (CLARITIN) 10 MG tablet Take 1 tablet (10 mg total) by mouth daily. Patient not taking: Reported on 10/02/2017 07/31/17   Salary, Evelena Asa, MD   Allergies  Allergen Reactions  . Nodolor [Isometheptene-Dichloral-Apap] Other (See Comments)    Reaction:  Headaches     FAMILY HISTORY:  family history includes Breast cancer (age of onset: 47) in her daughter; Cancer in her father; Cancer (age of onset: 110) in her mother; Deep vein thrombosis in her brother; Dementia (age of onset: 78) in her father; Hypertension in her brother and father; Stroke in her mother. SOCIAL HISTORY:  reports that she quit smoking about 10 years ago. Her smoking use included cigarettes. She started smoking about 39 years ago. She has a 60.00 pack-year smoking history. She has never used smokeless tobacco. She reports that she does not drink alcohol or use drugs.  REVIEW OF SYSTEMS: Positives in BOLD  Constitutional: Negative for fever, chills, weight loss, malaise/fatigue and diaphoresis.  HENT: Negative for hearing loss, ear pain, nosebleeds, congestion, sore throat, neck pain, tinnitus and ear discharge.   Eyes: Negative for blurred vision, double vision, photophobia, pain, discharge and redness.  Respiratory: cough, hemoptysis, sputum production, shortness of breath, wheezing and stridor.   Cardiovascular: chest pain, palpitations, orthopnea, claudication, leg swelling and PND.  Gastrointestinal: Negative for heartburn, nausea, vomiting, abdominal pain, diarrhea, constipation, blood in stool and melena.  Genitourinary: Negative for dysuria, urgency, frequency, hematuria and flank pain.  Musculoskeletal: Negative for myalgias, back pain, joint pain and falls.  Skin: Negative for itching and rash.  Neurological: dizziness, tingling,  tremors, sensory change, speech change, focal weakness, seizures, loss of consciousness, weakness and headaches.  Endo/Heme/Allergies: Negative  for environmental allergies and polydipsia. Does not bruise/bleed easily.  SUBJECTIVE:  c/o mild chest discomfort   VITAL SIGNS: Temp:  [98.1 F (36.7 C)] 98.1 F (36.7 C) (06/17 2025) Pulse Rate:  [85-122] 122 (06/17 2158) Resp:  [21] 21 (06/17 2158) BP: (110)/(74) 110/74 (06/17 2025) SpO2:  [98 %-100 %] 100 % (06/17 2158) Weight:  [102.1 kg (225 lb)] 102.1 kg (225 lb) (06/17 2025)  PHYSICAL EXAMINATION: General: acutely ill appearing female, NAD on Bipap  Neuro: alert and oriented, follows commands HEENT: supple, mild JVD  Cardiovascular: sinus tach with T wave depression, no R/G Lungs: faint crackles throughout, slightly labored  Abdomen: +BS x4, obese, soft, non tender, non distended  Musculoskeletal: trace bilateral lower extremity edema  Skin: intact no rashes or lesions   Recent Labs  Lab 09/29/2017 2034  NA 140  K 4.3  CL 100*  CO2 21*  BUN 40*  CREATININE 5.21*  GLUCOSE 185*   Recent Labs  Lab 09/27/2017 2034  HGB 8.8*  HCT 25.7*  WBC 14.1*  PLT 257   Dg Chest Portable 1 View  Result Date: 09/23/2017 CLINICAL DATA:  72 year old female with weakness. Status post dialysis today. EXAM: PORTABLE CHEST 1 VIEW COMPARISON:  07/29/2017 and earlier. FINDINGS: Portable AP upright view at 2104 hours. Improved lung volumes compared to April. Stable cardiomegaly and mediastinal contours. Sequelae of TAVR. No pneumothorax, pleural effusion or consolidation identified. Nonspecific bilateral increased pulmonary interstitial opacity, somewhat nodular in the right upper lobe. Paucity bowel gas in the upper abdomen. No acute osseous abnormality identified. IMPRESSION: 1. Nonspecific increased bilateral pulmonary interstitial opacity, somewhat nodular in the right lung. Top differential considerations include: - mild or developing pulmonary interstitial edema, - hematogenously disseminated infection, - viral/atypical respiratory infection. 2. No pleural effusion or other acute  cardiopulmonary abnormality identified. Electronically Signed   By: Odessa Fleming M.D.   On: 10/17/2017 21:35    ASSESSMENT / PLAN: New diffuse ST depression  Elevated troponin secondary to demand ischemia vs. NSTEMI  Acute on chronic respiratory failure secondary to pulmonary edema vs. pneumonia  ESRD on HD  Anemia of chronic disease Hx: Severe Aortic Stenosis (s/p TAVR 2016), DVT, Pulmonary Embolism, Chronic diastolic CHF, Severe Aortic Stenosis, GI Bleed, HTN, and Hyperlipidemia P: Prn Bipap for dyspnea and/or hypoxia  Repeat CXR in am Prn bronchodilator therapy  Continuous telemetry monitoring  Trend troponin's  Echo pending  Will check BNP  Cardiology consulted appreciate input Heparin gtt Trend CBC  Monitor for s/sx of bleeding and transfuse for hgb <7 Trend BMP  Replace electrolytes as indicated  Trend lactic acid  Nephrology consulted appreciate input-HD per recommendations   Trend WBC and monitor fever curve Will check PCT if elevated will start abx   Sonda Rumble, AGNP  Pulmonary/Critical Care Pager [phone] (please enter 7 digits) PCCM Consult Pager [phone] (please enter 7 digits)

## 2017-10-05 ENCOUNTER — Other Ambulatory Visit: Payer: Self-pay

## 2017-10-05 ENCOUNTER — Inpatient Hospital Stay: Payer: Medicare Other

## 2017-10-05 ENCOUNTER — Inpatient Hospital Stay (HOSPITAL_COMMUNITY)
Admit: 2017-10-05 | Discharge: 2017-10-05 | Disposition: A | Discharge status: Home / Self Care | Payer: Medicare Other | Attending: Internal Medicine | Admitting: Internal Medicine

## 2017-10-05 DIAGNOSIS — I214 Non-ST elevation (NSTEMI) myocardial infarction: Secondary | ICD-10-CM

## 2017-10-05 DIAGNOSIS — R579 Shock, unspecified: Secondary | ICD-10-CM

## 2017-10-05 DIAGNOSIS — R9431 Abnormal electrocardiogram [ECG] [EKG]: Secondary | ICD-10-CM

## 2017-10-05 DIAGNOSIS — I361 Nonrheumatic tricuspid (valve) insufficiency: Secondary | ICD-10-CM

## 2017-10-05 DIAGNOSIS — J9601 Acute respiratory failure with hypoxia: Secondary | ICD-10-CM

## 2017-10-05 LAB — ECHOCARDIOGRAM COMPLETE
Height: 64 in
Weight: 3682.56 oz

## 2017-10-05 LAB — PROCALCITONIN
PROCALCITONIN: 2.74 ng/mL
Procalcitonin: 2.99 ng/mL

## 2017-10-05 LAB — HEPARIN LEVEL (UNFRACTIONATED): Heparin Unfractionated: 1.7 IU/mL — ABNORMAL HIGH (ref 0.30–0.70)

## 2017-10-05 LAB — BASIC METABOLIC PANEL
Anion gap: 17 — ABNORMAL HIGH (ref 5–15)
BUN: 47 mg/dL — AB (ref 6–20)
CHLORIDE: 103 mmol/L (ref 101–111)
CO2: 20 mmol/L — AB (ref 22–32)
Calcium: 7.8 mg/dL — ABNORMAL LOW (ref 8.9–10.3)
Creatinine, Ser: 5.8 mg/dL — ABNORMAL HIGH (ref 0.44–1.00)
GFR calc Af Amer: 8 mL/min — ABNORMAL LOW (ref >60)
GFR calc non Af Amer: 7 mL/min — ABNORMAL LOW (ref >60)
GLUCOSE: 163 mg/dL — AB (ref 65–99)
Potassium: 5.1 mmol/L (ref 3.5–5.1)
Sodium: 140 mmol/L (ref 135–145)

## 2017-10-05 LAB — CBC
HEMATOCRIT: 27.9 % — AB (ref 35.0–47.0)
HEMOGLOBIN: 9 g/dL — AB (ref 12.0–16.0)
MCH: 34.4 pg — AB (ref 26.0–34.0)
MCHC: 32.3 g/dL (ref 32.0–36.0)
MCV: 106.5 fL — AB (ref 80.0–100.0)
Platelets: 202 10*3/uL (ref 150–440)
RBC: 2.62 MIL/uL — ABNORMAL LOW (ref 3.80–5.20)
RDW: 16.2 % — AB (ref 11.5–14.5)
WBC: 13.3 10*3/uL — ABNORMAL HIGH (ref 3.6–11.0)

## 2017-10-05 LAB — TROPONIN I
Troponin I: 5.37 ng/mL (ref <0.03)
Troponin I: >65 ng/mL (ref <0.03)

## 2017-10-05 LAB — APTT: aPTT: >160 seconds (ref 24–36)

## 2017-10-05 LAB — LACTIC ACID, PLASMA
LACTIC ACID, VENOUS: 2.5 mmol/L — AB (ref 0.5–1.9)
Lactic Acid, Venous: 3.9 mmol/L (ref 0.5–1.9)

## 2017-10-05 LAB — MRSA PCR SCREENING: MRSA BY PCR: NEGATIVE

## 2017-10-05 LAB — BRAIN NATRIURETIC PEPTIDE: B Natriuretic Peptide: 520 pg/mL — ABNORMAL HIGH (ref 0.0–100.0)

## 2017-10-05 MED ORDER — CHLORHEXIDINE GLUCONATE 0.12 % MT SOLN
15.0000 mL | Freq: Two times a day (BID) | OROMUCOSAL | Status: DC
  Administered 2017-10-05: 15 mL via OROMUCOSAL

## 2017-10-05 MED ORDER — HEPARIN (PORCINE) IN NACL 100-0.45 UNIT/ML-% IJ SOLN: 750.0000 [IU]/h | INTRAMUSCULAR | Status: DC

## 2017-10-05 MED ORDER — ORAL CARE MOUTH RINSE: 15.0000 mL | Freq: Two times a day (BID) | OROMUCOSAL | Status: DC

## 2017-10-05 MED ORDER — IOPAMIDOL (ISOVUE-370) INJECTION 76%
100.0000 mL | Freq: Once | INTRAVENOUS | Status: AC | PRN
  Administered 2017-10-05: 100 mL via INTRAVENOUS

## 2017-10-05 MED ORDER — SODIUM CHLORIDE 0.9 % IV SOLN
500.0000 mg | INTRAVENOUS | Status: DC
  Administered 2017-10-05: 500 mg via INTRAVENOUS
  Filled 2017-10-05 (×2): qty 500

## 2017-10-05 MED ORDER — SODIUM CHLORIDE 0.9 % IV SOLN
2.0000 g | INTRAVENOUS | Status: DC
  Administered 2017-10-05: 2 g via INTRAVENOUS
  Filled 2017-10-05: qty 2
  Filled 2017-10-05: qty 20

## 2017-10-05 MED ORDER — PERFLUTREN LIPID MICROSPHERE
1.0000 mL | INTRAVENOUS | Status: AC | PRN
  Administered 2017-10-05: 2 mL via INTRAVENOUS
  Filled 2017-10-05: qty 10

## 2017-10-07 ENCOUNTER — Telehealth: Payer: Self-pay | Admitting: *Deleted

## 2017-10-07 ENCOUNTER — Telehealth: Payer: Self-pay | Admitting: Family Medicine

## 2017-10-07 NOTE — Telephone Encounter (Signed)
Called daughter Lynnell Dike, did not leave message. Will try later.

## 2017-10-07 NOTE — Telephone Encounter (Signed)
Informed funeral faxed death cert is ready for pick up. Faxed back per their request. Nothing further needed.

## 2017-10-07 NOTE — Telephone Encounter (Signed)
Death certificate faxed for completion for cremation. Original will be faxed later. Death certificate placed on provider desk for completion.

## 2017-10-08 NOTE — Telephone Encounter (Signed)
Spoke with daughter, expressed my condolences. 

## 2017-10-10 LAB — CULTURE, BLOOD (ROUTINE X 2)
Culture: NO GROWTH
Culture: NO GROWTH

## 2017-10-11 ENCOUNTER — Telehealth: Payer: Self-pay

## 2017-10-11 NOTE — Telephone Encounter (Signed)
[employer] dropped off Death Certificate to be completed and signed °Placed in nurse box °

## 2017-10-11 NOTE — Telephone Encounter (Signed)
Faxed copy already completed  Will place original in DS folder.

## 2017-10-12 NOTE — Telephone Encounter (Signed)
Funeral home informed death cert is ready for pick up. Nothing further needed. 

## 2017-10-18 NOTE — Progress Notes (Signed)
*  PRELIMINARY RESULTS* Echocardiogram 2D Echocardiogram has been performed.  Ariel Smith [name] Oct 15, 2017, 9:20 AM

## 2017-10-18 NOTE — Progress Notes (Signed)
[facility]  ROUNDING NOTE   Subjective:  Patient well-known to [place] as an outpatient. She reports that she did go to hemodialysis yesterday. Currently short of breath and on BiPAP. Blood pressure also noted to be low. Troponins also elevated.   Objective:  Vital signs in last 24 hours:  Temp:  [97.6 F (36.4 C)-98.9 F (37.2 C)] 98.9 F (37.2 C) (06/18 0800) Pulse Rate:  [85-127] 107 (06/18 0800) Resp:  [21-30] 30 (06/18 0800) BP: (79-112)/(48-91) 79/48 (06/18 0800) SpO2:  [94 %-100 %] 100 % (06/18 0800) FiO2 (%):  [30 %] 30 % (06/18 0100) Weight:  [102.1 kg (225 lb)-104.4 kg (230 lb 2.6 oz)] 104.4 kg (230 lb 2.6 oz) (06/18 0349)  Weight change:  Filed Weights   09/27/2017 2025 10/10/2017 2322 10/17/2017 0349  Weight: 102.1 kg (225 lb) 104.4 kg (230 lb 2.6 oz) 104.4 kg (230 lb 2.6 oz)    Intake/Output: I/O last 3 completed shifts: In: 321.6 [I.V.:71.6; IV Piggyback:250] Out: -    Intake/Output this shift:  No intake/output data recorded.  Physical Exam: General: On bipap  Head: Normocephalic, atraumatic. Moist oral mucosal membranes  Eyes: Anicteric  Neck: Supple, trachea midline  Lungs:  Scattered rhonchi, on bipap  Heart: S1S2 no rubs  Abdomen:  Soft, nontender, bowel sounds present  Extremities: Trace peripheral edema.  Neurologic: Awake, alert, following commands  Skin: No lesions  Access: AV fistula    Basic Metabolic Panel: Recent Labs  Lab 09/22/2017 2034 October 17, 2017 0533  NA 140 140  K 4.3 5.1  CL 100* 103  CO2 21* 20*  GLUCOSE 185* 163*  BUN 40* 47*  CREATININE 5.21* 5.80*  CALCIUM 8.1* 7.8*    Liver Function Tests: Recent Labs  Lab 09/22/2017 2034  AST 31  ALT 10*  ALKPHOS 101  BILITOT 0.6  PROT 6.4*  ALBUMIN 3.3*   No results for input(s): LIPASE, AMYLASE in the last 168 hours. No results for input(s): AMMONIA in the last 168 hours.  CBC: Recent Labs  Lab 09/29/2017 2034 10-17-17 0533  WBC 14.1* 13.3*  HGB 8.8* 9.0*  HCT  25.7* 27.9*  MCV 104.6* 106.5*  PLT 257 202    Cardiac Enzymes: Recent Labs  Lab 09/18/2017 2034 09/23/2017 2324 10-17-2017 0533  TROPONINI 0.13* 5.37* >65.00*    BNP: Invalid input(s): POCBNP  CBG: Recent Labs  Lab 10/08/2017 2313  GLUCAP 91    Microbiology: Results for orders placed or performed during the hospital encounter of 09/25/2017  MRSA PCR Screening     Status: None   Collection Time: 09/27/2017 11:20 PM  Result Value Ref Range Status   MRSA by PCR NEGATIVE NEGATIVE Final    Comment:        The GeneXpert MRSA Assay (FDA approved for NASAL specimens only), is one component of a comprehensive MRSA colonization surveillance program. It is not intended to diagnose MRSA infection nor to guide or monitor treatment for MRSA infections. Performed at [facility], [address] [place], [place] [address]   CULTURE, BLOOD (ROUTINE X 2) w Reflex to ID Panel     Status: None (Preliminary result)   Collection Time: 17-Oct-2017  1:29 AM  Result Value Ref Range Status   Specimen Description BLOOD RIGHT [facility]  Final   Special Requests   Final    BOTTLES DRAWN AEROBIC AND ANAEROBIC Blood Culture results may not be optimal due to an excessive volume of blood received in culture bottles   Culture  Final    NO GROWTH < 12 HOURS Performed at [facility], [address] [place], [place] [address]    Report Status PENDING  Incomplete  CULTURE, BLOOD (ROUTINE X 2) w Reflex to ID Panel     Status: None (Preliminary result)   Collection Time: 2017/11/03  1:29 AM  Result Value Ref Range Status   Specimen Description BLOOD RIGHT HAND  Final   Special Requests   Final    BOTTLES DRAWN AEROBIC AND ANAEROBIC Blood Culture results may not be optimal due to an excessive volume of blood received in culture bottles   Culture   Final    NO GROWTH < 12 HOURS Performed at [facility], [address] [place], [place] [address]    Report Status PENDING  Incomplete     Coagulation Studies: Recent Labs    09/20/2017 2030  LABPROT 14.6  INR 1.15    Urinalysis: No results for input(s): COLORURINE, LABSPEC, PHURINE, GLUCOSEU, HGBUR, BILIRUBINUR, KETONESUR, PROTEINUR, UROBILINOGEN, NITRITE, LEUKOCYTESUR in the last 72 hours.  Invalid input(s): APPERANCEUR    Imaging: Dg Chest Portable 1 View  Result Date: 10/07/2017 CLINICAL DATA:  72 year old female with weakness. Status post dialysis today. EXAM: PORTABLE CHEST 1 VIEW COMPARISON:  07/29/2017 and earlier. FINDINGS: Portable AP upright view at 2104 hours. Improved lung volumes compared to April. Stable cardiomegaly and mediastinal contours. Sequelae of TAVR. No pneumothorax, pleural effusion or consolidation identified. Nonspecific bilateral increased pulmonary interstitial opacity, somewhat nodular in the right upper lobe. Paucity bowel gas in the upper abdomen. No acute osseous abnormality identified. IMPRESSION: 1. Nonspecific increased bilateral pulmonary interstitial opacity, somewhat nodular in the right lung. Top differential considerations include: - mild or developing pulmonary interstitial edema, - hematogenously disseminated infection, - viral/atypical respiratory infection. 2. No pleural effusion or other acute cardiopulmonary abnormality identified. Electronically Signed   By: Odessa Fleming M.D.   On: 10/03/2017 21:35     Medications:   . heparin     . calcium acetate  1,334 mg Oral TID WC  . chlorhexidine  15 mL Mouth Rinse BID  . cinacalcet  30 mg Oral Q breakfast  . clopidogrel  75 mg Oral Daily  . famotidine  40 mg Oral QPM  . guaiFENesin-codeine      . mouth rinse  15 mL Mouth Rinse q12n4p  . rosuvastatin  10 mg Oral Daily  . sertraline  50 mg Oral Daily  . traZODone  100 mg Oral QHS   acetaminophen **OR** acetaminophen, ALPRAZolam, guaiFENesin-codeine, ipratropium-albuterol, morphine injection, ondansetron **OR** ondansetron (ZOFRAN) IV  Assessment/ Plan:  72 y.o. female with  chronic grade 2 diastolic congestive heart failure, COPD requiring oxygen supplementation by nasal cannula, end-stage renal disease, GERD, history of GI bleed, history of DVT in the leg, hypertension, history of transcatheter aortic valve replacement for severe aortic stenosis  [facility]/MWF/LUE AVF  1.  End-stage renal disease 2.  Acute respiratory failure. 3.  Secondary hyperparathyroidism 4.  Anemia of chronic kidney disease. 5.  Acute myocardial infarction.   Plan: Patient is critically ill at the moment.  She is requiring BiPAP.  Patient did undergo hemodialysis yesterday.  She is also noted to be hypotensive at the moment.  Currently oxygen saturation is 100% while on BiPAP.  She has a very elevated troponin greater than 65.  Await further cardiology input.  We will hold off on dialysis at the moment given low blood pressure 79/48.  If we do need to pursue  dialysis we will likely need to consider CRRT which will require temporary dialysis catheter placement.    LOS: 1 Ariel Smith 07/01/199:34 AM

## 2017-10-18 NOTE — Consult Note (Signed)
Cardiology Consult    Patient ID: GLORIANN RIEDE MRN: [id number], DOB/AGE: 1946-02-18   Admit date: 09/27/2017 Date of Consult: 10-10-2017  Primary Physician: [name], MD Primary Cardiologist: Julien Nordmann, MD Requesting Provider: Henreitta Leber, MD  Patient Profile    Ariel Smith is a 72 y.o. female with a history of Ao stenosis s/p TAVR (2016), HFpEF, HTN, HL, h/o DVT s/p IVC filter and on chronic eliquis, ESRD on HD, GERD, GIB, COPD, and obesity who is being seen today for the evaluation of NSTEMI at the request of Dr. Nemiah Commander.  Past Medical History   Past Medical History:  Diagnosis Date  . Anemia of chronic disease   . Bronchiectasis (HCC) 08/2014    suggested by thoracic xray  . Chronic diastolic CHF (congestive heart failure) (HCC)    a. echo 10/2014: EF 60-65%, no RWMA, GR1DD; b. 03/2015 Echo: EF 60-65%, no rwma, Gr2 DD; c. 07/2017 Echo: EF 60-65%, Gr2 DD.  Marland Kitchen Chronic respiratory failure (HCC)    a. 2/2 COPD; b. on 4-5L via nasal cannula  . COPD (chronic obstructive pulmonary disease) (HCC)   . Depression   . ESRD (end stage renal disease) (HCC) 08/2011   a. on HD (TThSa), L forearm AV fistula, [name]  . Frequent headaches   . GERD (gastroesophageal reflux disease)   . GIB (gastrointestinal bleeding)    a. leading to cessation of warfarin 11/2014  . History of cardiac cath    a. 12/2014 Cath: nl cors. 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  Marland Kitchen History of colon polyps 2013   colonoscopy ([name])  . History of DVT of lower extremity 2009, 2017   a. left sided x2, with PE s/p IVC filter placement, coumadin stopped 2/2 GI bleed in 2016; b. 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  Marland Kitchen HLD (hyperlipidemia)   . HTN (hypertension)   . Morbid obesity (HCC)   . Osteoarthritis   . Osteopenia 01/2013  . Pulmonary embolism (HCC) 2009  . Renal insufficiency   . Secondary hyperparathyroidism of renal origin (HCC)   . Severe aortic stenosis    a. echo 10/2014:  mod to sev AS; b. 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 03/2015 Echo: EF 60-65%, Gr2 DD, m ild to mod AS (peak grad , mean ); d. 07/2017 Echo: EF 60-65%, no rwma, Gr2 DD, Mod AS, mild AI, mean grad . Mild to mod TR. mildly dil LA. nl RV fxn. PASP .    Past Surgical History:  Procedure Laterality Date  . A/V FISTULAGRAM Left 07/09/2016   Procedure: A/V Fistulagram;  Surgeon: [name], MD;  Location: [facility] INVASIVE CV LAB;  Service: Cardiovascular;  Laterality: Left;  . BUNIONECTOMY Left 2003  . CARDIAC CATHETERIZATION N/A 01/18/2015   patent coronary arteries without significant osbtruction and preserved LV function, severe aortic stenosis; Procedure: Right/Left Heart Cath and Coronary Angiography;  Surgeon: Tonny Bollman, MD  . CHOLECYSTECTOMY  2012  . COLONOSCOPY  08/2011   colon polyps ([name])  . ESOPHAGOGASTRODUODENOSCOPY  08/2011   gastric cardia polyp  . ESOPHAGOGASTRODUODENOSCOPY Left 11/11/2014   Procedure: ESOPHAGOGASTRODUODENOSCOPY (EGD);  Surgeon: [name], MD;  Location: [facility] ENDOSCOPY;  Service: Endoscopy;  Laterality: Left;  . EXTERIORIZATION OF A CONTINUOUS AMBULATORY PERITONEAL DIALYSIS CATHETER  01/2013   removal 12/2103 - [name]  . hospitalization  12/2013   recurrent R pleural effusion due to peritoneal fluid translocation s/p rpt thoracentesis with 1.3  L fluid removed, ERSD started on HD this hospitalization  . PERIPHERAL VASCULAR CATHETERIZATION N/A 11/12/2014   Procedure: IVC Filter Insertion;  Surgeon: [name], MD;  Location: [facility] INVASIVE CV LAB;  Service: Cardiovascular;  Laterality: N/A;  . PERIPHERAL VASCULAR CATHETERIZATION Left 10/24/2015   Procedure: Lower Extremity Venography with intervention (thormbolysis/thrombectomy);  Surgeon: [name], MD;  Location: [facility] INVASIVE CV LAB;  Service: Cardiovascular;  Laterality: Left;  . REPLACEMENT TOTAL KNEE Left 2006  . SHOULDER  ARTHROSCOPY Right 2009  . TEE WITHOUT CARDIOVERSION N/A 02/19/2015   Procedure: TRANSESOPHAGEAL ECHOCARDIOGRAM (TEE);  Surgeon: Tonny Bollman, MD;  Location: [facility] OR;  Service: Open Heart Surgery;  Laterality: N/A;  . TONSILLECTOMY  1955  . TRANSCATHETER AORTIC VALVE REPLACEMENT, TRANSFEMORAL Right 02/19/2015   Procedure: TRANSCATHETER AORTIC VALVE REPLACEMENT, TRANSFEMORAL;  Surgeon: Tonny Bollman, MD;  Location: [facility] OR;  Service: Open Heart Surgery;  Laterality: Right;  . TUBAL LIGATION  1980  . US ECHOCARDIOGRAPHY  12/2013   EF 55-60%, nl LV sys fxn, mild-mod MR, AS, increased LV posterior wall thickness, mild TR     Allergies  Allergies  Allergen Reactions  . Nodolor [Isometheptene-Dichloral-Apap] Other (See Comments)    Reaction:  Headaches     History of Present Illness    72 y.o. female with a history of Ao stenosis s/p TAVR, HFpEF, HTN, HL, h/o DVT s/p IVC filter and on chronic eliquis, ESRD on HD, GERD, GIB, COPD, and obesity.  She was previously evaluated for severe aortic stenosis in 2016.  Cath at that time showed normal coronary arteries.  She then underwent successful TAVR and has been followed with annual echocardiograms.  Most recent echo was performed in April in the setting of admission for resp failure and volume overload requiring dialysis.  Echo showed nl EF with slight worsening of AoV gradient  mean gradient of .  She f/u with [name] on 5/10, @ which time she was doing well.  She lives locally with her daughter and her dtr says that she has been having a lot of upper back pain.  They have attributed this to DDD and arthritis.  Pt is very sedentary and does have some degree of chronic dyspnea.  It is very common for her to be very fatigued on her dialysis days (MWF) and will often come home after HD and take a nap.  On 6/17, she underwent HD as usual and took a nap afterward.  Around 5pm, she got up and alerted her dtr to severe upper back pain.  This persisted for  about 2 hrs before EMS was called.  She was taken to the ED where ECG showed diffuse, deep ST depression, which was new. ST elevation noted in AvR. Initial troponin was mildly elevated @ 0.13.  CXR suggested edema vs infxn.  F/u troponin returned @ 5.37 and subsequent ECG's showed deepening of ST depression.  Cardiology was contacted and she was placed on heparin and admitted to ICU.  Pt continued to have mid-scapular and back pain overnight. Per family, she was very restless.  She required BiPAP throughout the night in the setting of acute on chronic resp failure.  BP soft overnight, largely trending in the 90's but lower this AM - 79/48.  Troponin >65 this AM.  Pt currently pain free.  Very sleepy after receiving morphine, xanax, and trazodone overnight.  Dtr @ bedside.  Inpatient Medications    . calcium acetate  1,334 mg Oral TID WC  . chlorhexidine  15 mL Mouth Rinse BID  . cinacalcet  30 mg Oral Q breakfast  . clopidogrel  75 mg Oral Daily  . famotidine  40 mg Oral QPM  . guaiFENesin-codeine      . mouth rinse  15 mL Mouth Rinse q12n4p  . rosuvastatin  10 mg Oral Daily  . sertraline  50 mg Oral Daily  . traZODone  100 mg Oral QHS    Family History    Family History  Problem Relation Age of Onset  . Deep vein thrombosis Brother   . Cancer Mother 79       colon  . Stroke Mother   . Cancer Father        prostate  . Hypertension Father   . Dementia Father 79  . Hypertension Brother   . Breast cancer Daughter 5  . Diabetes Neg Hx   . CAD Neg Hx    indicated that her mother is deceased. She indicated that her father is deceased. She indicated that only one of her two brothers is alive. She indicated that her daughter is alive. She indicated that the status of her neg hx is unknown.   Social History    Social History   Socioeconomic History  . Marital status: Widowed    Spouse name: Not on file  . Number of children: Not on file  . Years of education: Not on file  .  Highest education level: Not on file  Occupational History  . Not on file  Social Needs  . Financial resource strain: Not on file  . Food insecurity:    Worry: Not on file    Inability: Not on file  . Transportation needs:    Medical: Not on file    Non-medical: Not on file  Tobacco Use  . Smoking status: Former Smoker    Packs/day: 2.00    Years: 30.00    Pack years: 60.00    Types: Cigarettes    Start date: 04/20/1978    Last attempt to quit: 04/21/2007    Years since quitting: 10.4  . Smokeless tobacco: Never Used  Substance and Sexual Activity  . Alcohol use: No  . Drug use: No  . Sexual activity: Never  Lifestyle  . Physical activity:    Days per week: Not on file    Minutes per session: Not on file  . Stress: Not on file  Relationships  . Social connections:    Talks on phone: Not on file    Gets together: Not on file    Attends religious service: Not on file    Active member of club or organization: Not on file    Attends meetings of clubs or organizations: Not on file    Relationship status: Not on file  . Intimate partner violence:    Fear of current or ex partner: Not on file    Emotionally abused: Not on file    Physically abused: Not on file    Forced sexual activity: Not on file  Other Topics Concern  . Not on file  Social History Narrative   Activity: no regular exercise   Diet: limits 32 oz/day due to dialysis, some fruits/vegetables     Review of Systems    General:  No chills, fever, night sweats or weight changes.  Cardiovascular:  +++ chest pain, +++ mid-scapular back pain, +++ chronic dyspnea on exertion, no edema, orthopnea, palpitations, paroxysmal nocturnal dyspnea. Dermatological: No rash, lesions/masses Respiratory: +++ cough, +++ dyspnea  Urologic: No hematuria, dysuria Abdominal:   No nausea, vomiting, diarrhea, bright red blood per rectum, melena, or hematemesis Neurologic:  No visual changes, wkns, changes in mental status. All other  systems reviewed and are otherwise negative except as noted above.  Physical Exam    Blood pressure (!) 79/48, pulse (!) 103, temperature 98.9 F (37.2 C), temperature source Oral, resp. rate (!) 27, height 5\' 4"  (1.626 m), weight 230 lb 2.6 oz (104.4 kg), SpO2 98 %.  General: on BiPAP. Groggy. Psych: Flat affect. Awakens to nod yes/no. Neuro: Moves all extremities spontaneously. HEENT: Normal  Neck: Supple obese.  Difficult to gauge JVP 2/2 girth.  Cannot exclude bruits in setting of BiPAP. Lungs:  Resp regular and unlabored, coarse breath sounds throughout. Heart: RRR, very distant heart sounds - difficult to hear over BiPAP. Abdomen: Soft, non-tender, non-distended, BS + x 4.  Extremities: Cool to touch. No clubbing, cyanosis or edema. DP/PT/Radials 1+ and equal bilaterally.  Labs     Recent Labs    02-Nov-2017 2034 Nov 02, 2017 2324 10/17/2017 0533  TROPONINI 0.13* 5.37* >65.00*   Lab Results  Component Value Date   WBC 13.3 (H) 09/21/2017   HGB 9.0 (L) 10/07/2017   HCT 27.9 (L) 10/01/2017   MCV 106.5 (H) 09/18/2017   PLT 202 10/04/2017    Recent Labs  Lab 02-Nov-2017 2034 10/06/2017 0533  NA 140 140  K 4.3 5.1  CL 100* 103  CO2 21* 20*  BUN 40* 47*  CREATININE 5.21* 5.80*  CALCIUM 8.1* 7.8*  PROT 6.4*  --   BILITOT 0.6  --   ALKPHOS 101  --   ALT 10*  --   AST 31  --   GLUCOSE 185* 163*   Lab Results  Component Value Date   CHOL 190 09/14/2017   HDL 33.80 (L) 09/14/2017   LDLCALC 91 07/10/2015   TRIG 209.0 (H) 09/14/2017     Radiology Studies    Dg Chest Portable 1 View  Result Date: 11/02/17 CLINICAL DATA:  72 year old female with weakness. Status post dialysis today. EXAM: PORTABLE CHEST 1 VIEW COMPARISON:  07/29/2017 and earlier. FINDINGS: Portable AP upright view at 2104 hours. Improved lung volumes compared to April. Stable cardiomegaly and mediastinal contours. Sequelae of TAVR. No pneumothorax, pleural effusion or consolidation identified. Nonspecific  bilateral increased pulmonary interstitial opacity, somewhat nodular in the right upper lobe. Paucity bowel gas in the upper abdomen. No acute osseous abnormality identified. IMPRESSION: 1. Nonspecific increased bilateral pulmonary interstitial opacity, somewhat nodular in the right lung. Top differential considerations include: - mild or developing pulmonary interstitial edema, - hematogenously disseminated infection, - viral/atypical respiratory infection. 2. No pleural effusion or other acute cardiopulmonary abnormality identified. Electronically Signed   By: Odessa Fleming M.D.   On: 11-02-2017 21:35   Ct Angio Chest/abd/pel For Dissection W And/or W/wo  Result Date: 09/20/2017 CLINICAL DATA:  Acute chest pain.  Evaluate for dissection. EXAM: CT ANGIOGRAPHY CHEST, ABDOMEN AND PELVIS TECHNIQUE: Multidetector CT imaging through the chest, abdomen and pelvis was performed using the standard protocol during bolus administration of intravenous contrast. Multiplanar reconstructed images and MIPs were obtained and reviewed to evaluate the vascular anatomy. CONTRAST:  ISOVUE-370 IOPAMIDOL (ISOVUE-370) INJECTION 76% COMPARISON:  01/25/2015 FINDINGS: CTA CHEST FINDINGS Cardiovascular: Noncontrast phase shows no intramural hematoma. Chronic cardiomegaly. No pericardial effusion. Status post transcatheter aortic valve replacement. No evidence of para valvular collection. No evidence of pulmonary embolism. Negative for aortic dissection. There is diffuse atheromatous wall thickening of  the aorta. Mediastinum/Nodes: Degraded by motion.  No adenopathy or mass seen. Lungs/Pleura: Patchy ground-glass and streaky opacity with occasional mosaic attenuation. There is atelectasis in the lower lobes where there is also trace left and small right pleural effusions. Musculoskeletal: No acute or aggressive finding Review of the MIP images confirms the above findings. CTA ABDOMEN AND PELVIS FINDINGS VASCULAR Aorta: Atheromatous wall  thickening. No dissection or aneurysm. No inflammatory changes. Celiac: Generalized small appearing vessels likely accentuated by early contrast timing. Scattered atherosclerotic plaque. No major branch occlusion. Negative for aneurysm. SMA: Extensive atheromatous plaque, calcified and noncalcified, with multifocal moderate to advanced narrowing. No major branch occlusion is seen, but limited by contrast timing and probable vaso constriction. Renals: Essentially non-opacified. There is end-stage renal disease. IMA: Patent Inflow: Atheromatous plaque without dissection, aneurysm, or inflammatory changes. Veins: Non-opacified.  IVC filter in unremarkable position. Review of the MIP images confirms the above findings. NON-VASCULAR Hepatobiliary: No acute finding.  Cholecystectomy clips. Pancreas: Generalized atrophy.  No acute finding. Spleen: Negative Adrenals/Urinary Tract: Negative adrenals. Severe bilateral renal atrophy consistent with history of end-stage renal disease. The bladder is decompressed. Stomach/Bowel: No bowel enhancement due to timing/shunting. No detected wall thickening, pneumatosis, or perforation. Negative for obstruction. Distal colonic diverticulosis Lymphatic: Negative for adenopathy. Reproductive: Unremarkable for age. Other: No ascites or pneumoperitoneum. Calcification in the right upper quadrant attributed to remote inflammation. Musculoskeletal: No acute or aggressive finding Review of the MIP images confirms the above findings. IMPRESSION: 1. No evidence of acute aortic syndrome. No evidence of pulmonary embolism. 2. Narrow abdominal visceral branches, probable shock manifestation. 3. Cardiomegaly with small effusions and mild atelectasis. 4. Transcatheter aortic valve replacement. Electronically Signed   By: Marnee Spring M.D.   On: 10/07/17 10:15    ECG & Cardiac Imaging    Sinus tachycardia, 106, 2-64mm diffuse ST depression w/ 1mm ST elevation in V1, <1mm ST elevation in V2,  2-3 mm ST elevation in aVR.  Assessment & Plan    1.  Acute coronary syndrome:  Pt presented with mid-scapular back pain on 6/17 and finding of new, diffuse ST depression and ST elevation in aVR. Initial trop mildly elevated but subsequently rose to >65.  F/u ECG this AM with persistent ST changes and ST elev in V1/V2, but now chest pain free.  She previously underwent diagnostic catheterization in 2016 in the setting of work-up for TAVR, and this showed normal coronary arteries.  She is currently hypotensive and clinical picture concerning for diffuse ischemia.  With prior TAVR/aortic manipulation and mid scapular back pain, concern for aortic dissection exists.  Will obtain CT angios of the aorta to rule out dissection if this is negative, we would likely plan to proceed with catheterization later today.  Continue heparin.  Last Eliquis dose was yesterday morning.  She is on chronic Plavix for unclear reasons.   2.  Hypertension: Home medications on hold.  Follow and add pressors if necessary.  Critical care following.  3.  End-stage renal disease: Nephrology following.  4.  History of DVT/PE: On chronic Eliquis.  No known missed doses.  Also has IVC filter.  Signed, Nicolasa Ducking, NP 10-07-17, 10:22 AM  For questions or updates, please contact   Please consult [email or web address] for contact info under Cardiology/STEMI.

## 2017-10-18 NOTE — Progress Notes (Signed)
ANTICOAGULATION CONSULT NOTE - Initial Consult  Pharmacy Consult for Heparin  Indication: chest pain/ACS  Allergies  Allergen Reactions  . Nodolor [Isometheptene-Dichloral-Apap] Other (See Comments)    Reaction:  Headaches     Patient Measurements: Height: 5\' 4"  (162.6 cm) Weight: 230 lb 2.6 oz (104.4 kg) IBW/kg (Calculated) : 54.7 Heparin Dosing Weight: 76.5 kg   Vital Signs: Temp: 97.7 F (36.5 C) (06/18 0400) Temp Source: Axillary (06/18 0400) BP: 112/91 (06/18 0500) Pulse Rate: 106 (06/18 0600)  Labs: Recent Labs    2017/10/12 2030 October 12, 2017 2034 12-Oct-2017 2100 October 12, 2017 2324 09/18/2017 0533  HGB  --  8.8*  --   --  9.0*  HCT  --  25.7*  --   --  27.9*  PLT  --  257  --   --  202  APTT 33  --   --   --  >160*  LABPROT 14.6  --   --   --   --   INR 1.15  --   --   --   --   HEPARINUNFRC  --   --  1.54*  --  1.70*  CREATININE  --  5.21*  --   --  5.80*  TROPONINI  --  0.13*  --  5.37* >65.00*    Estimated Creatinine Clearance: 10.5 mL/min (A) (by C-G formula based on SCr of 5.8 mg/dL (H)).   Medical History: Past Medical History:  Diagnosis Date  . Anemia of chronic disease   . Bronchiectasis (HCC) 08/2014    suggested by thoracic xray  . Chronic diastolic CHF (congestive heart failure) (HCC)    a. echo 10/2014: EF 60-65%, no RWMA, GR1DD; b. 03/2015 Echo: EF 60-65%, no rwma, Gr2 DD.  Marland Kitchen Chronic respiratory failure (HCC)    a. 2/2 COPD; b. on 4-5L via nasal cannula  . COPD (chronic obstructive pulmonary disease) (HCC)   . Depression   . ESRD (end stage renal disease) (HCC) 08/2011   a. on HD (TThSa), L forearm AV fistula, [name]  . Frequent headaches   . GERD (gastroesophageal reflux disease)   . GIB (gastrointestinal bleeding)    a. leading to cessation of warfarin 11/2014  . History of cardiac cath    a. 12/2014 Cath: nl cors. 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  Marland Kitchen History of colon polyps 2013   colonoscopy ([name])  . History of DVT of lower extremity  2009, 2017   a. left sided x2, with PE s/p IVC filter placement, coumadin stopped 2/2 GI bleed in 2016; b. 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  Marland Kitchen HLD (hyperlipidemia)   . HTN (hypertension)   . Morbid obesity (HCC)   . Osteoarthritis   . Osteopenia 01/2013  . Pulmonary embolism (HCC) 2009  . Secondary hyperparathyroidism of renal origin (HCC)   . Severe aortic stenosis    a. echo 10/2014: mod to sev AS; b. 02/2015 TAVR (Owen/Cooper): Edwards Sapien 3 THV (size 53mm, model #9600TFX, ser# [id number]); c. 03/2015 Echo: EF 60-65%, Gr2 DD, m ild to mod AS (peak grad , mean ).    Medications:  Medications Prior to Admission  Medication Sig Dispense Refill Last Dose  . albuterol (VENTOLIN HFA) 108 (90 BASE) MCG/ACT inhaler Inhale 2 puffs into the lungs every 4 (four) hours as needed for wheezing or shortness of breath. 18 g 3 prn at prn  . ALPRAZolam (NIRAVAM) 0.5 MG dissolvable tablet TAKE 1/2 TO 1 (ONE-HALF TO ONE) TABLET BY MOUTH ONCE DAILY  AS NEEDED FOR ANXIETY 30 tablet 0 October 28, 2017 at Unknown time  . calcium acetate (PHOSLO) 667 MG capsule Take 1,334 mg by mouth 3 (three) times daily with meals.    28-Oct-2017 at am  . cinacalcet (SENSIPAR) 30 MG tablet Take 30 mg by mouth daily.   10/28/17 at Unknown time  . clopidogrel (PLAVIX) 75 MG tablet TAKE 1 TABLET BY MOUTH ONCE DAILY WITH BREAKFAST 90 tablet 0 28-Oct-2017 at Unknown time  . Cyanocobalamin (B-12) 500 MCG TABS Take 500 mcg by mouth daily. 30 tablet  10-28-17 at Unknown time  . diphenhydrAMINE (BENADRYL) 25 MG tablet Take 25 mg by mouth every 6 (six) hours as needed for allergies.    prn at prn  . ELIQUIS 2.5 MG TABS tablet TAKE 1 TABLET BY MOUTH TWICE DAILY 60 tablet 6 Oct 28, 2017 at am  . famotidine (PEPCID) 40 MG tablet TAKE 1 TABLET BY MOUTH IN THE EVENING 30 tablet 5 October 28, 2017 at Unknown time  . ipratropium-albuterol (DUONEB) 0.5-2.5 (3) MG/3ML SOLN Take 3 mLs by nebulization every 6 (six) hours as needed (shortness  of breath/ wheezing). 360 mL 1 prn at prn  . lidocaine-prilocaine (EMLA) cream Apply 1 application topically as needed (topical anesthesia for hemodialysis if Gebauers and Lidocaine injection are ineffective.). 30 g 0 Taking  . midodrine (PROAMATINE) 5 MG tablet Take 1 tablet (5 mg total) by mouth 3 (three) times daily with meals. 90 tablet 0 10-28-2017 at am  . Multiple Vitamin (MULTIVITAMIN WITH MINERALS) TABS tablet Take 1 tablet by mouth daily.   Oct 28, 2017 at Unknown time  . rosuvastatin (CRESTOR) 10 MG tablet Take 1 tablet (10 mg total) by mouth daily. 30 tablet 3 10/28/17 at Unknown time  . sertraline (ZOLOFT) 50 MG tablet Take 1 tablet (50 mg total) by mouth daily. 30 tablet 6 October 28, 2017 at Unknown time  . torsemide (DEMADEX) 100 MG tablet Take 1 tablet (100 mg total) by mouth daily. 30 tablet 6 October 28, 2017 at Unknown time  . traMADol (ULTRAM) 50 MG tablet Take 0.5-1 tablets (25-50 mg total) by mouth 2 (two) times daily as needed. (Patient taking differently: Take 25-50 mg by mouth 2 (two) times daily as needed for moderate pain. ) 50 tablet 0 prn at prn  . traZODone (DESYREL) 50 MG tablet Take 100 mg by mouth at bedtime.   10/03/2017 at Unknown time  . guaiFENesin (MUCINEX) 600 MG 12 hr tablet Take 1 tablet (600 mg total) by mouth 2 (two) times daily as needed for cough. (Patient not taking: Reported on Oct 28, 2017) 30 tablet 0 Not Taking at Unknown time  . loratadine (CLARITIN) 10 MG tablet Take 1 tablet (10 mg total) by mouth daily. (Patient not taking: Reported on 10-28-17) 30 tablet 0 Not Taking at Unknown time    Assessment: Pharmacy consulted to dose heparin in this 72 year old female admitted with ACS/NSTEMI. Pt was on Eliquis 2.5 mg PO BID at home, pt already had two doses on 6/17, last dose was at ~ 20:00.    CrCl = 11.3 ml/min   Goal of Therapy:  APTT :  68 - 109 Heparin level 0.3-0.7 units/ml Monitor platelets by anticoagulation protocol: Yes   Plan:  06/17 @ 2030 (baseline aPTT)  33, HL 1.54. 06/18 @ 0530 aPTT > 160 sec, HL 1.74. Will hold drip for 1 hour and restart @ 750 units/hr and will recheck aPTT/HL @ 1700. Confirmed w/ lab that second aPTT was a venipuncture (not a line draw -- therefore level is accurate).  Thomasene Ripple, PharmD, BCPS Clinical Pharmacist 09/26/2017

## 2017-10-18 NOTE — Progress Notes (Signed)
Family in to view body. Awaiting arrival of more family prior to transfer to morgue.

## 2017-10-18 NOTE — Discharge Summary (Signed)
DEATH SUMMARY  DATE OF ADMISSION:  October 05, 2017  DATE OF DISCHARGE/DEATH: October 06, 2017   ADMISSION DIAGNOSES:   Weakness Back pain Abnormal EKG Elevated troponin I ESRD COPD Acute on chronic diastolic CHF Hypertension history History of hyperlipidemia History of GERD History of anxiety DNR  DISCHARGE DIAGNOSES:   Weakness Back pain Abnormal EKG Elevated troponin I Acute myocardial infarction Acute hypoxemic respiratory failure due to pulmonary edema ESRD COPD Acute on chronic diastolic CHF Hypertension history History of hyperlipidemia History of GERD History of anxiety DNR Asystolic cardiac arrest   PRESENTATION:   Pt was admitted to the ICU/SDU by hospitalist service with the following HPI and the above admission diagnoses:  Ariel Smith  is a 72 y.o. female who presents with weakness, back pain, shortness of breath.  Patient started initially feeling short of breath earlier today, had some pain between her shoulder blades in her back, and felt very weak.  Here in the ED she was found to have pulmonary edema versus infiltrate.  Her troponin was elevated.  She is a dialysis patient.  She had to be placed on BiPAP in the ED.  Hospitalist were called for admission.    HOSPITAL COURSE:   She was admitted to ICU/SDU and supported with noninvasive ventilation.  Her procalcitonin was mildly elevated and she was placed on broad-spectrum antibiotics without a clear source of infection.  Her chest x-ray revealed pulmonary edema.  Her troponin eyes progressively increased to >65.00.  An Echocardiogram was performed which revealed LVEF 40-45% with hypokinesis of inferior lateral myocardium.  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  There was moderate to severe mitral regurgitation.  Left atrium was mildly dilated.  Pulmonary artery systolic pressure was moderately increased and estimated at 40 mmHg.   She was seen in consultation by cardiology who noted that she had had clean coronaries  on left heart catheterization 2 or 3 years prior to this hospitalization.  A CTA of chest, abdomen and pelvis was ordered to rule out aortic dissection.  This revealed no evidence of acute aortic syndrome and no evidence of pulmonary embolism.  There were no major findings otherwise.  After the CT was performed, the patient was transferred from the table back to her hospital bed.  She suddenly developed severe bradycardia progressing to asystole.  She had already been designated DNR in the event of cardiac arrest.  Therefore no resuscitative efforts were made.    Cause of death:  Asystolic cardiac arrest due to acute myocardial infarction  Contributing factors: Acute hypoxemic respiratory failure Pulmonary edema ESRD  Autopsy: No  Did smoking contribute to her death: Probably   Billy Fischer, MD PCCM service Mobile [phone] Pager [phone] Oct 06, 2017 11:54 AM

## 2017-10-18 NOTE — Significant Event (Signed)
Patient went to CTA of chest, abdomen and pelvis, ordered by cardiology.  Suffered asystolic cardiac arrest after CT scan completed.  Patient was DNR.  No resuscitative efforts were initiated.  I have updated the family and offered my sympathies.  They were very understanding, noting her progressive decline in health over the past several years.  Billy Fischer, MD PCCM service Mobile [phone] Pager [phone] 10/17/2017 11:46 AM

## 2017-10-18 NOTE — Care Management (Signed)
    Chronic dialysis at [facility] MWF. Chronic home O2. Marchelle Folks with [employer] notified of patient's presentation.

## 2017-10-18 NOTE — Progress Notes (Signed)
Post mortem care complete per policy. Body transported to morgue via pt transport.

## 2017-10-18 NOTE — Progress Notes (Signed)
Family state that all family members have been in to view pt. Body prepared for transport to morgue.

## 2017-10-18 NOTE — Progress Notes (Signed)
[facility] -  at [facility]   PATIENT NAME: Ariel Smith    MR#:  [id number]  DATE OF BIRTH:  October 02, 1945  SUBJECTIVE:  CHIEF COMPLAINT:   Chief Complaint  Patient presents with  . Weakness   - remains on Bipap, hypotensive - back pain persists, improved dyspnea - elevated troponins and diffuse ST depressions on EKG  REVIEW OF SYSTEMS:  Review of Systems  Constitutional: Positive for malaise/fatigue. Negative for chills and fever.  HENT: Negative for congestion, ear discharge, hearing loss and nosebleeds.   Eyes: Negative for blurred vision and double vision.  Respiratory: Positive for shortness of breath. Negative for cough and wheezing.   Cardiovascular: Negative for chest pain, palpitations and leg swelling.  Gastrointestinal: Negative for abdominal pain, constipation, diarrhea, nausea and vomiting.  Genitourinary: Negative for dysuria.  Musculoskeletal: Positive for back pain.  Neurological: Negative for dizziness, focal weakness, seizures, weakness and headaches.  Psychiatric/Behavioral: Negative for depression.    DRUG ALLERGIES:   Allergies  Allergen Reactions  . Nodolor [Isometheptene-Dichloral-Apap] Other (See Comments)    Reaction:  Headaches     VITALS:  Blood pressure (!) 79/48, pulse (!) 107, temperature 98.9 F (37.2 C), temperature source Oral, resp. rate (!) 30, height 5\' 4"  (1.626 m), weight 104.4 kg (230 lb 2.6 oz), SpO2 100 %.  PHYSICAL EXAMINATION:  Physical Exam  GENERAL:  72 y.o.-year-old obese patient lying in the bed, ill appearing EYES: Pupils equal, round, reactive to light and accommodation. No scleral icterus. Extraocular muscles intact.  HEENT: Head atraumatic, normocephalic. Oropharynx and nasopharynx clear. On Bipap NECK:  Supple, no jugular venous distention. No thyroid enlargement, no tenderness.  LUNGS: Normal breath sounds bilaterally, no wheezing, rales,rhonchi or crepitation. No use of accessory muscles  of respiration. Decreased bibasilar breath sounds CARDIOVASCULAR: S1, S2 normal. No  rubs, or gallops. 3/6 systolic murmur in aortic area ABDOMEN: Soft, nontender, nondistended. Bowel sounds present. No organomegaly or mass.  EXTREMITIES: No pedal edema, cyanosis, or clubbing. Left fore arm AV fistula. NEUROLOGIC: Cranial nerves II through XII are intact. Muscle strength equal  in all extremities. Sensation intact. Gait not checked. able to move all extremities, PSYCHIATRIC: The patient is alert and oriented x 3.  SKIN: No obvious rash, lesion, or ulcer.    LABORATORY PANEL:   CBC Recent Labs  Lab 2017-10-12 0533  WBC 13.3*  HGB 9.0*  HCT 27.9*  PLT 202   ------------------------------------------------------------------------------------------------------------------  Chemistries  Recent Labs  Lab 10/13/2017 2034 12-Oct-2017 0533  NA 140 140  K 4.3 5.1  CL 100* 103  CO2 21* 20*  GLUCOSE 185* 163*  BUN 40* 47*  CREATININE 5.21* 5.80*  CALCIUM 8.1* 7.8*  AST 31  --   ALT 10*  --   ALKPHOS 101  --   BILITOT 0.6  --    ------------------------------------------------------------------------------------------------------------------  Cardiac Enzymes Recent Labs  Lab 12-Oct-2017 0533  TROPONINI >65.00*   ------------------------------------------------------------------------------------------------------------------  RADIOLOGY:  Dg Chest Portable 1 View  Result Date: 10/13/2017 CLINICAL DATA:  72 year old female with weakness. Status post dialysis today. EXAM: PORTABLE CHEST 1 VIEW COMPARISON:  07/29/2017 and earlier. FINDINGS: Portable AP upright view at 2104 hours. Improved lung volumes compared to April. Stable cardiomegaly and mediastinal contours. Sequelae of TAVR. No pneumothorax, pleural effusion or consolidation identified. Nonspecific bilateral increased pulmonary interstitial opacity, somewhat nodular in the right upper lobe. Paucity bowel gas in the upper abdomen. No  acute osseous abnormality identified. IMPRESSION: 1. Nonspecific increased bilateral pulmonary interstitial opacity,  somewhat nodular in the right lung. Top differential considerations include: - mild or developing pulmonary interstitial edema, - hematogenously disseminated infection, - viral/atypical respiratory infection. 2. No pleural effusion or other acute cardiopulmonary abnormality identified. Electronically Signed   By: Odessa Fleming M.D.   On: 10/13/2017 21:35    EKG:   Orders placed or performed during the hospital encounter of 09/21/2017  . ED EKG  . ED EKG  . EKG 12-Lead  . EKG 12-Lead  . EKG 12-Lead  . EKG 12-Lead  . EKG 12-Lead  . EKG 12-Lead  . EKG 12-Lead  . EKG 12-Lead  . EKG 12-Lead  . EKG 12-Lead    ASSESSMENT AND PLAN:   72y/o F with PMH of ESRD on HD, aortic valve stenosis s/p TAVR, h/o DVT/PE on eliquis, COPD not on home o2 admitted with back pain and dyspnea and noted to have elevated troponin and diffuse ischemic changes on EKG.  1. Acute coronary syndrome- diffuse cardiac ischemia evidence noted on EKG with diffuse ST depressions- significant - appreciate cardiology consult - prior cath reportedly negative in 2016 - CT chest/abd ordered to r/o dissection - If CT negative- for cardiac cath this afternoon - on heparin drip - also on plavix at home- eliquis on hold  2. Acute respiratory failure- secondary to cardiac causes likely F/u ECHO - on Bipap - management per pulmonary  3. ESRD on HD- last HD yesterday per schedule - on Tue-Thurs-Sat schedule - if needs dialysis after cath- consider CRRT due to her hypotension  4. H/o TAVR- on heparin drip. Monitor  5. Anemia of chronic disease- monitor, no indication for transfusion  6. DVT Prophylaxis- heparin drip  Poor prognosis,.    All the records are reviewed and case discussed with Care Management/Social Workerr. Management plans discussed with the patient, family and they are in agreement.  CODE  STATUS: DNR  TOTAL TIME TAKING CARE OF THIS PATIENT: 38 minutes.   POSSIBLE D/C IN 2-3 DAYS, DEPENDING ON CLINICAL CONDITION.   Enid Baas M.D on 2017/10/18 at 10:07 AM  Between 7am to 6pm - Pager - [phone]  After 6pm go to [email or web address] - Social research officer, government  [facility]  Office  [phone]  CC: Primary care physician; [name], MD

## 2017-10-18 NOTE — Progress Notes (Signed)
   09/22/2017 1100  Clinical Encounter Type  Visited With Family  Visit Type Initial;Spiritual support;Death  Referral From Nurse  Consult/Referral To Chaplain  Chaplain received page from nurse, that Chaplain was needed in ICU 14. Chaplain said ok and went to room. Pt had declined and no family was in room. Chaplain found nurse and nurse apologize for not letting Chaplain know where family was. Chaplain said no problem and nurse took [place] where family was. Chaplain walks in an introduce herself to Pt daughter Lynnell Dike). [name] reach for a hug and Chaplain embrace her, and gave her condolence for her loss. Chaplain ask [name] was there anything she could do for her, make calls, etc.. [name] said no , her husband and nephew were on the way. Husband and mother in law arrived and husband hug [name]. However, they were not aware that PT had expired. Chaplain give her apologizes and says, no she has declined. (Mother ask Chaplain was PT worse?) Mother in law of daughter says on no and husband says what, she is gone. [name] says yes Laney Potash is gone. Chaplain gave encouragement to family and once more family came in, daughter Lynnell Dike) decided those who wish to see PT would go back. Chaplain went in with family and embrace them as they visit the Pt and made her pastoral presence available. [name] along with Chaplain escorted family back to quiet room and Chaplain ask [name] was there anything else she needed. [name] thanked Orthoptist for her pastoral presence and Chaplain said if you need anything, have nurse to [facility] me.

## 2017-10-18 NOTE — Progress Notes (Signed)
CT complete. Prior to moving back to bed, pt in ST and alert. O2 sat 98% on BiPAP.  Upon moving pt back to bed from CT stretcher pt became bradycardic, unresponsive,  then asystole.

## 2017-10-18 DEATH — deceased

## 2017-12-23 ENCOUNTER — Ambulatory Visit: Payer: Medicare Other | Admitting: Family Medicine

## 2018-09-15 ENCOUNTER — Other Ambulatory Visit: Payer: Medicare Other

## 2018-09-16 ENCOUNTER — Other Ambulatory Visit: Payer: Medicare Other

## 2018-09-23 ENCOUNTER — Encounter: Payer: Medicare Other | Admitting: Family Medicine

## 2018-09-23 ENCOUNTER — Ambulatory Visit: Payer: Medicare Other
